# Patient Record
Sex: Male | Born: 1960
Health system: Southern US, Community
[De-identification: ages and names within clinical notes are randomized; demographics above are authoritative.]

## PROBLEM LIST (undated history)

## (undated) DIAGNOSIS — F1010 Alcohol abuse, uncomplicated: Secondary | ICD-10-CM

## (undated) DIAGNOSIS — G4730 Sleep apnea, unspecified: Secondary | ICD-10-CM

## (undated) DIAGNOSIS — I10 Essential (primary) hypertension: Secondary | ICD-10-CM

## (undated) DIAGNOSIS — I2510 Atherosclerotic heart disease of native coronary artery without angina pectoris: Secondary | ICD-10-CM

## (undated) DIAGNOSIS — Z8701 Personal history of pneumonia (recurrent): Secondary | ICD-10-CM

## (undated) DIAGNOSIS — I509 Heart failure, unspecified: Secondary | ICD-10-CM

## (undated) DIAGNOSIS — M1990 Unspecified osteoarthritis, unspecified site: Secondary | ICD-10-CM

## (undated) DIAGNOSIS — N186 End stage renal disease: Secondary | ICD-10-CM

## (undated) DIAGNOSIS — M069 Rheumatoid arthritis, unspecified: Secondary | ICD-10-CM

## (undated) DIAGNOSIS — Z992 Dependence on renal dialysis: Secondary | ICD-10-CM

## (undated) DIAGNOSIS — Z720 Tobacco use: Secondary | ICD-10-CM

## (undated) DIAGNOSIS — F419 Anxiety disorder, unspecified: Secondary | ICD-10-CM

## (undated) DIAGNOSIS — R0602 Shortness of breath: Secondary | ICD-10-CM

## (undated) DIAGNOSIS — K635 Polyp of colon: Secondary | ICD-10-CM

## (undated) DIAGNOSIS — K922 Gastrointestinal hemorrhage, unspecified: Secondary | ICD-10-CM

## (undated) DIAGNOSIS — I5030 Unspecified diastolic (congestive) heart failure: Secondary | ICD-10-CM

## (undated) DIAGNOSIS — N02B9 Other recurrent and persistent immunoglobulin A nephropathy: Secondary | ICD-10-CM

## (undated) DIAGNOSIS — D649 Anemia, unspecified: Secondary | ICD-10-CM

## (undated) DIAGNOSIS — J449 Chronic obstructive pulmonary disease, unspecified: Secondary | ICD-10-CM

## (undated) DIAGNOSIS — I214 Non-ST elevation (NSTEMI) myocardial infarction: Secondary | ICD-10-CM

## (undated) DIAGNOSIS — I480 Paroxysmal atrial fibrillation: Secondary | ICD-10-CM

## (undated) DIAGNOSIS — N028 Recurrent and persistent hematuria with other morphologic changes: Secondary | ICD-10-CM

## (undated) DIAGNOSIS — E785 Hyperlipidemia, unspecified: Secondary | ICD-10-CM

## (undated) DIAGNOSIS — J189 Pneumonia, unspecified organism: Secondary | ICD-10-CM

## (undated) DIAGNOSIS — K219 Gastro-esophageal reflux disease without esophagitis: Secondary | ICD-10-CM

## (undated) HISTORY — DX: Rheumatoid arthritis, unspecified: M06.9

## (undated) HISTORY — PX: CARDIAC CATHETERIZATION: SHX172

## (undated) HISTORY — DX: Recurrent and persistent hematuria with other morphologic changes: N02.8

## (undated) HISTORY — DX: Essential (primary) hypertension: I10

## (undated) HISTORY — DX: Polyp of colon: K63.5

## (undated) HISTORY — DX: Alcohol abuse, uncomplicated: F10.10

## (undated) HISTORY — DX: Other recurrent and persistent immunoglobulin A nephropathy: N02.B9

## (undated) HISTORY — DX: Atherosclerotic heart disease of native coronary artery without angina pectoris: I25.10

## (undated) HISTORY — DX: Paroxysmal atrial fibrillation: I48.0

## (undated) HISTORY — DX: Sleep apnea, unspecified: G47.30

## (undated) HISTORY — DX: Anxiety disorder, unspecified: F41.9

## (undated) HISTORY — DX: Gastrointestinal hemorrhage, unspecified: K92.2

## (undated) HISTORY — DX: Heart failure, unspecified: I50.9

## (undated) HISTORY — DX: Chronic obstructive pulmonary disease, unspecified: J44.9

## (undated) HISTORY — DX: Anemia, unspecified: D64.9

## (undated) HISTORY — DX: Tobacco use: Z72.0

## (undated) HISTORY — DX: Hyperlipidemia, unspecified: E78.5

## (undated) HISTORY — DX: Non-ST elevation (NSTEMI) myocardial infarction: I21.4

## (undated) HISTORY — DX: End stage renal disease: N18.6

---

## 2010-07-22 ENCOUNTER — Emergency Department: Payer: Self-pay | Admitting: Emergency Medicine

## 2012-09-13 HISTORY — PX: RENAL BIOPSY: SHX156

## 2012-11-02 ENCOUNTER — Emergency Department: Payer: Self-pay | Admitting: Emergency Medicine

## 2012-11-02 LAB — CBC
HCT: 36.4 % — ABNORMAL LOW (ref 40.0–52.0)
HGB: 12.5 g/dL — ABNORMAL LOW (ref 13.0–18.0)
MCH: 34.4 pg — ABNORMAL HIGH (ref 26.0–34.0)
MCHC: 34.3 g/dL (ref 32.0–36.0)
MCV: 100 fL (ref 80–100)
Platelet: 206 10*3/uL (ref 150–440)
RDW: 12.3 % (ref 11.5–14.5)
WBC: 7.1 10*3/uL (ref 3.8–10.6)

## 2012-11-02 LAB — COMPREHENSIVE METABOLIC PANEL
Albumin: 1.5 g/dL — ABNORMAL LOW (ref 3.4–5.0)
Alkaline Phosphatase: 89 U/L (ref 50–136)
Bilirubin,Total: 0.3 mg/dL (ref 0.2–1.0)
Calcium, Total: 8 mg/dL — ABNORMAL LOW (ref 8.5–10.1)
Chloride: 107 mmol/L (ref 98–107)
Co2: 26 mmol/L (ref 21–32)
Glucose: 96 mg/dL (ref 65–99)
Osmolality: 280 (ref 275–301)
Potassium: 4.9 mmol/L (ref 3.5–5.1)
Sodium: 139 mmol/L (ref 136–145)

## 2012-11-02 LAB — PRO B NATRIURETIC PEPTIDE: B-Type Natriuretic Peptide: 1991 pg/mL — ABNORMAL HIGH (ref 0–125)

## 2012-11-13 ENCOUNTER — Encounter: Payer: Self-pay | Admitting: *Deleted

## 2012-11-20 ENCOUNTER — Ambulatory Visit (INDEPENDENT_AMBULATORY_CARE_PROVIDER_SITE_OTHER): Payer: PRIVATE HEALTH INSURANCE | Admitting: Cardiovascular Disease

## 2012-11-20 ENCOUNTER — Encounter: Payer: Self-pay | Admitting: Cardiovascular Disease

## 2012-11-20 VITALS — BP 161/102 | HR 109 | Ht 67.0 in | Wt 173.2 lb

## 2012-11-20 DIAGNOSIS — R0789 Other chest pain: Secondary | ICD-10-CM

## 2012-11-20 DIAGNOSIS — R0609 Other forms of dyspnea: Secondary | ICD-10-CM

## 2012-11-20 DIAGNOSIS — F17200 Nicotine dependence, unspecified, uncomplicated: Secondary | ICD-10-CM

## 2012-11-20 DIAGNOSIS — I509 Heart failure, unspecified: Secondary | ICD-10-CM | POA: Insufficient documentation

## 2012-11-20 DIAGNOSIS — R0600 Dyspnea, unspecified: Secondary | ICD-10-CM

## 2012-11-20 DIAGNOSIS — R350 Frequency of micturition: Secondary | ICD-10-CM

## 2012-11-20 DIAGNOSIS — Z720 Tobacco use: Secondary | ICD-10-CM

## 2012-11-20 MED ORDER — METOPROLOL TARTRATE 25 MG PO TABS: 25.0000 mg | ORAL_TABLET | Freq: Two times a day (BID) | ORAL | Status: DC

## 2012-11-20 MED ORDER — SPIRONOLACTONE 25 MG PO TABS: 25.0000 mg | ORAL_TABLET | Freq: Every day | ORAL | Status: DC

## 2012-11-20 MED ORDER — FUROSEMIDE 20 MG PO TABS: 20.0000 mg | ORAL_TABLET | Freq: Two times a day (BID) | ORAL | Status: DC

## 2012-11-20 NOTE — Progress Notes (Signed)
HPI  This is a 52 year old Caucasian male who was referred by Dr. Mariea Clonts from [facility] ER for evaluation of newly diagnosed congestive heart failure. He has prolonged history of tobacco use as well as excessive alcohol use. He drinks at least 2-3 beers a day but many days he drinks 6-12 pack. He has been drinking for more than 30 years. He has not seen a physician in many years. He is not aware of any cardiac problems or liver problems. He presented recently to the emergency room with progressive lower extremity edema and weight gain. He reports a weight gain of 30 pounds over the last few months associated with severe lower extremity edema bilaterally with significant discomfort. He also has dyspnea but no chest discomfort. His labs in the emergency room showed a creatinine of 1.13 with a BUN of 20. AST was slightly elevated at 45 with severely reduced albumin level at 1.5 and a total protein of 5.4. He was mildly anemic at 12.5 with an MCV of 100. Platelet count was normal. BNP was close to 2000. Chest x-ray showed evidence of COPD. Bilateral lower extremity venous duplex showed no evidence of DVT there was mildly enlarged inguinal nodes bilaterally. He was discharged home on Lasix 20 mg once daily which he later switched to twice daily but he reports no significant improvement.  No Known Allergies   No current outpatient prescriptions on file prior to visit.   No current facility-administered medications on file prior to visit.     Past Medical History  Diagnosis Date  . CHF (congestive heart failure)   . COPD (chronic obstructive pulmonary disease)   . ETOH abuse   . Tobacco abuse      History reviewed. No pertinent past surgical history.   Family History  Problem Relation Age of Onset  . Heart disease Father   . Heart disease Brother      History   Social History  . Marital Status: Married    Spouse Name: N/A    Number of Children: N/A  . Years of Education: N/A    Occupational History  . Not on file.   Social History Main Topics  . Smoking status: Current Every Day Smoker -- 0.50 packs/day for 30 years    Types: Cigarettes  . Smokeless tobacco: Not on file  . Alcohol Use: 3.6 oz/week    6 Cans of beer per week     Comment: daily  . Drug Use: No  . Sexually Active: Not on file   Other Topics Concern  . Not on file   Social History Narrative  . No narrative on file     ROS Constitutional: Negative for fever, chills, diaphoresis, activity change, appetite change and fatigue.  HENT: Negative for hearing loss, nosebleeds, congestion, sore throat, facial swelling, drooling, trouble swallowing, neck pain, voice change, sinus pressure and tinnitus.  Eyes: Negative for photophobia, pain, discharge and visual disturbance.  Respiratory: Negative for apnea, cough, chest tightness and wheezing.  Cardiovascular: Negative for chest pain, palpitations . Gastrointestinal: Negative for nausea, vomiting, abdominal pain, diarrhea, constipation, blood in stool and abdominal distention.  Genitourinary: Negative for dysuria, hematuria and decreased urine volume.  Musculoskeletal: Negative for myalgias, back pain, joint swelling, arthralgias and gait problem.  Skin: Negative for color change, pallor, rash and wound.  Neurological: Negative for dizziness, tremors, seizures, syncope, speech difficulty, weakness, light-headedness, numbness and headaches.  Psychiatric/Behavioral: Negative for suicidal ideas, hallucinations, behavioral problems and agitation. The patient is not  nervous/anxious.     PHYSICAL EXAM   BP 161/102  Pulse 109  Ht 5\' 7"  (1.702 m)  Wt 173 lb 4 oz (78.586 kg)  BMI 27.13 kg/m2 Constitutional: He is oriented to person, place, and time. He appears well-developed and well-nourished. No distress.  HENT: No nasal discharge.  Head: Normocephalic and atraumatic.  Eyes: Pupils are equal and round. Right eye exhibits no discharge. Left eye  exhibits no discharge.  Neck: Normal range of motion. Neck supple. No JVD present. No thyromegaly present.  Cardiovascular: Normal rate, regular rhythm, normal heart sounds and. Exam reveals no gallop and no friction rub. No murmur heard.  Pulmonary/Chest: Effort normal and breath sounds normal. No stridor. No respiratory distress. He has no wheezes. He has no rales. He exhibits no tenderness.  Abdominal: Soft. Bowel sounds are normal. He exhibits no distension. There is no tenderness. There is no rebound and no guarding.  Musculoskeletal: Normal range of motion. He exhibits severe +3 edema going all the way up to his thighs and mild tenderness.  Neurological: He is alert and oriented to person, place, and time. Coordination normal.  Skin: Skin is warm and dry. Possible spider angiomas noted on the chest. He is not diaphoretic. No erythema. No pallor.  Psychiatric: He has a normal mood and affect. His behavior is normal. Judgment and thought content normal.       EKG: Sinus  Tachycardia  WITHIN NORMAL LIMITS   ASSESSMENT AND PLAN

## 2012-11-20 NOTE — Assessment & Plan Note (Signed)
The patient's manifestation include severe lower extremity edema and mostly right-sided heart failure. JVP is not significantly elevated. However, his BNP was elevated. It is possible that he might have been diagnosed cardiomyopathy related to excessive alcohol use. The other concerning issue is severely decreased albumin at 1.5 with abnormal liver enzymes. It's highly likely that he has underlying liver cirrhosis. I will check urinalysis to make sure he has no significant proteinuria. I will also request an echocardiogram to evaluate LV systolic function. He is still significantly fluid overloaded and seems to be 30 pounds above his normal weight. I will start him on spironolactone 25 mg once daily he take in addition to furosemide. Check basic metabolic profile in one week. Followup with me in 2 weeks. If his fluid overload does not improve, he will need to be admitted to the hospital for IV diuresis. I asked him to establish with a primary care physician as well.

## 2012-11-20 NOTE — Assessment & Plan Note (Signed)
I discussed with him the importance of quitting tobacco and alcohol use.

## 2012-11-20 NOTE — Patient Instructions (Addendum)
Continue Lasix 20 mg twice daily.  Start Spironolactone 25 mg once daily.  Start Metoprolol 25 mg twice daily.   Check Urinalysis today.   Your physician has requested that you have an echocardiogram. Echocardiography is a painless test that uses sound waves to create images of your heart. It provides your doctor with information about the size and shape of your heart and how well your heart's chambers and valves are working. This procedure takes approximately one hour. There are no restrictions for this procedure.  Labs in 1 week.  Follow up in 2 weeks.

## 2012-11-21 ENCOUNTER — Other Ambulatory Visit: Payer: PRIVATE HEALTH INSURANCE

## 2012-11-21 ENCOUNTER — Encounter: Payer: Self-pay | Admitting: Cardiovascular Disease

## 2012-11-28 ENCOUNTER — Other Ambulatory Visit (INDEPENDENT_AMBULATORY_CARE_PROVIDER_SITE_OTHER): Payer: PRIVATE HEALTH INSURANCE

## 2012-11-28 ENCOUNTER — Other Ambulatory Visit: Payer: Self-pay

## 2012-11-28 DIAGNOSIS — R0789 Other chest pain: Secondary | ICD-10-CM

## 2012-11-28 DIAGNOSIS — R0989 Other specified symptoms and signs involving the circulatory and respiratory systems: Secondary | ICD-10-CM

## 2012-11-28 DIAGNOSIS — I509 Heart failure, unspecified: Secondary | ICD-10-CM

## 2012-11-28 DIAGNOSIS — R0600 Dyspnea, unspecified: Secondary | ICD-10-CM

## 2012-12-08 ENCOUNTER — Ambulatory Visit (INDEPENDENT_AMBULATORY_CARE_PROVIDER_SITE_OTHER): Payer: PRIVATE HEALTH INSURANCE | Admitting: Cardiovascular Disease

## 2012-12-08 ENCOUNTER — Encounter: Payer: Self-pay | Admitting: Cardiovascular Disease

## 2012-12-08 VITALS — BP 148/94 | HR 81 | Ht 67.0 in | Wt 164.5 lb

## 2012-12-08 DIAGNOSIS — I509 Heart failure, unspecified: Secondary | ICD-10-CM

## 2012-12-08 NOTE — Progress Notes (Signed)
HPI  This is a 52 year old Caucasian male who is here today for followup visit regarding  lower extremity edema. He has prolonged history of tobacco use as well as excessive alcohol use. He drinks at least 2-3 beers a day but many days he drinks 6-12 pack. He has been drinking for more than 30 years. He presented recently to the emergency room with progressive lower extremity edema and 30 pound weight gain. He was noted to have severe lower extremity edema bilaterally with significant discomfort. His labs in the emergency room showed a creatinine of 1.13 with a BUN of 20. AST was slightly elevated at 45 with severely reduced albumin level at 1.5 and a total protein of 5.4. He was mildly anemic at 12.5 with an MCV of 100. Platelet count was normal. BNP was close to 2000. Chest x-ray showed evidence of COPD. Bilateral lower extremity venous duplex showed no evidence of DVT there was mildly enlarged inguinal nodes bilaterally. He was discharged home on Lasix 20 mg once daily which he later switched to twice daily but he reports no significant improvement. During my initial evaluation, he was noted to be tachycardic and hypertensive. I started him on metoprolol 25 mg twice daily and added spironolactone 25 mg once daily. Due to his significant low albumin level. I performed urinalysis which showed severe proteinuria. I thus referred him to nephrology and he is currently getting workup for this. The dose of Lasix was increased to 40 mg twice daily by nephrology and since then the patient had significant improvement in his edema. He lost 10 pounds. He was also found to have hematuria and will be seeing urology. He had an echocardiogram done which showed normal LV systolic function with no evidence of pulmonary hypertension.  No Known Allergies   Current Outpatient Prescriptions on File Prior to Visit  Medication Sig Dispense Refill  . IBUPROFEN PO Take by mouth. Takes 2 -3 tablet as needed.      .  metoprolol tartrate (LOPRESSOR) 25 MG tablet Take 1 tablet (25 mg total) by mouth 2 (two) times daily.  60 tablet  6  . spironolactone (ALDACTONE) 25 MG tablet Take 1 tablet (25 mg total) by mouth daily.  30 tablet  6   No current facility-administered medications on file prior to visit.     Past Medical History  Diagnosis Date  . CHF (congestive heart failure)   . COPD (chronic obstructive pulmonary disease)   . ETOH abuse   . Tobacco abuse      History reviewed. No pertinent past surgical history.   Family History  Problem Relation Age of Onset  . Heart disease Father   . Heart disease Brother      History   Social History  . Marital Status: Married    Spouse Name: N/A    Number of Children: N/A  . Years of Education: N/A   Occupational History  . Not on file.   Social History Main Topics  . Smoking status: Current Every Day Smoker -- 0.50 packs/day for 30 years    Types: Cigarettes  . Smokeless tobacco: Not on file  . Alcohol Use: 3.6 oz/week    6 Cans of beer per week     Comment: daily  . Drug Use: No  . Sexually Active: Not on file   Other Topics Concern  . Not on file   Social History Narrative  . No narrative on file    PHYSICAL EXAM   BP  148/94  Pulse 81  Ht 5\' 7"  (1.702 m)  Wt 164 lb 8 oz (74.617 kg)  BMI 25.76 kg/m2 Constitutional: He is oriented to person, place, and time. He appears well-developed and well-nourished. No distress.  HENT: No nasal discharge.  Head: Normocephalic and atraumatic.  Eyes: Pupils are equal and round. Right eye exhibits no discharge. Left eye exhibits no discharge.  Neck: Normal range of motion. Neck supple. No JVD present. No thyromegaly present.  Cardiovascular: Normal rate, regular rhythm, normal heart sounds and. Exam reveals no gallop and no friction rub. No murmur heard.  Pulmonary/Chest: Effort normal and breath sounds normal. No stridor. No respiratory distress. He has no wheezes. He has no rales. He  exhibits no tenderness.  Abdominal: Soft. Bowel sounds are normal. He exhibits no distension. There is no tenderness. There is no rebound and no guarding.  Musculoskeletal: Normal range of motion. He exhibits severe +1 edema which is significantly better from before.  Neurological: He is alert and oriented to person, place, and time. Coordination normal.  Skin: Skin is warm and dry. Possible spider angiomas noted on the chest. He is not diaphoretic. No erythema. No pallor.  Psychiatric: He has a normal mood and affect. His behavior is normal. Judgment and thought content normal.       ASSESSMENT AND PLAN

## 2012-12-08 NOTE — Patient Instructions (Addendum)
Continue same medications.  Follow up in 4 months.

## 2012-12-08 NOTE — Assessment & Plan Note (Addendum)
He did have recently elevated BNP with significant fluid overload. However, his manifestation seems to be mostly related to hypoalbuminemia and significant proteinuria. Echocardiogram overall was unremarkable.  He has responded very well to current dose of diuretics which will be continued. He has a close followup appointment with nephrology next week to continue evaluation for proteinuria. His blood pressure and tachycardia improved significantly after the recent addition of metoprolol.

## 2013-02-14 ENCOUNTER — Other Ambulatory Visit: Payer: Self-pay | Admitting: Nephrology

## 2013-02-14 LAB — CBC WITH DIFFERENTIAL/PLATELET
Basophil #: 0.1 10*3/uL (ref 0.0–0.1)
Basophil %: 1.1 %
Eosinophil #: 0.2 10*3/uL (ref 0.0–0.7)
Eosinophil %: 2.4 %
HGB: 9.9 g/dL — ABNORMAL LOW (ref 13.0–18.0)
Lymphocyte #: 1.7 10*3/uL (ref 1.0–3.6)
MCH: 34 pg (ref 26.0–34.0)
MCHC: 34.4 g/dL (ref 32.0–36.0)
MCV: 99 fL (ref 80–100)
Neutrophil #: 3.5 10*3/uL (ref 1.4–6.5)
Neutrophil %: 54.2 %
Platelet: 243 10*3/uL (ref 150–440)
RBC: 2.91 10*6/uL — ABNORMAL LOW (ref 4.40–5.90)
RDW: 11.8 % (ref 11.5–14.5)

## 2013-02-14 LAB — URINALYSIS, COMPLETE
Hyaline Cast: 9
Ketone: NEGATIVE
Leukocyte Esterase: NEGATIVE
Ph: 5 (ref 4.5–8.0)
Protein: >=500
RBC,UR: 153 /HPF (ref 0–5)
Specific Gravity: 1.013 (ref 1.003–1.030)
Squamous Epithelial: 1
WBC UR: 7 /HPF (ref 0–5)

## 2013-02-14 LAB — APTT: Activated PTT: 30 secs (ref 23.6–35.9)

## 2013-02-14 LAB — COMPREHENSIVE METABOLIC PANEL
Albumin: 1.5 g/dL — ABNORMAL LOW (ref 3.4–5.0)
Alkaline Phosphatase: 79 U/L (ref 50–136)
Anion Gap: 8 (ref 7–16)
BUN: 33 mg/dL — ABNORMAL HIGH (ref 7–18)
Bilirubin,Total: 0.1 mg/dL — ABNORMAL LOW (ref 0.2–1.0)
Calcium, Total: 8.1 mg/dL — ABNORMAL LOW (ref 8.5–10.1)
Chloride: 103 mmol/L (ref 98–107)
Creatinine: 2.11 mg/dL — ABNORMAL HIGH (ref 0.60–1.30)
EGFR (African American): 41 — ABNORMAL LOW
EGFR (Non-African Amer.): 36 — ABNORMAL LOW
Potassium: 5 mmol/L (ref 3.5–5.1)
SGOT(AST): 29 U/L (ref 15–37)
SGPT (ALT): 19 U/L (ref 12–78)
Sodium: 133 mmol/L — ABNORMAL LOW (ref 136–145)

## 2013-02-14 LAB — PROTEIN / CREATININE RATIO, URINE
Creatinine, Urine: 121.4 mg/dL (ref 30.0–125.0)
Protein/Creat. Ratio: 4300 mg/gCREAT — ABNORMAL HIGH (ref 0–200)

## 2013-02-14 LAB — PROTIME-INR
INR: 0.9
Prothrombin Time: 12.7 secs (ref 11.5–14.7)

## 2013-02-16 ENCOUNTER — Observation Stay: Payer: Self-pay | Admitting: Nephrology

## 2013-02-16 LAB — COMPREHENSIVE METABOLIC PANEL
BUN: 30 mg/dL — ABNORMAL HIGH (ref 7–18)
Bilirubin,Total: 0.2 mg/dL (ref 0.2–1.0)
Calcium, Total: 8.1 mg/dL — ABNORMAL LOW (ref 8.5–10.1)
Co2: 23 mmol/L (ref 21–32)
EGFR (African American): >60
EGFR (Non-African Amer.): 53 — ABNORMAL LOW
Glucose: 79 mg/dL (ref 65–99)
Osmolality: 277 (ref 275–301)
Potassium: 4.9 mmol/L (ref 3.5–5.1)
SGOT(AST): 29 U/L (ref 15–37)
SGPT (ALT): 16 U/L (ref 12–78)
Total Protein: 5.3 g/dL — ABNORMAL LOW (ref 6.4–8.2)

## 2013-02-16 LAB — CBC WITH DIFFERENTIAL/PLATELET
Basophil #: 0.1 10*3/uL (ref 0.0–0.1)
Basophil %: 1.1 %
Eosinophil %: 1.5 %
HCT: 31.3 % — ABNORMAL LOW (ref 40.0–52.0)
Lymphocyte %: 13.7 %
MCH: 34.1 pg — ABNORMAL HIGH (ref 26.0–34.0)
MCHC: 34.8 g/dL (ref 32.0–36.0)
MCV: 98 fL (ref 80–100)
Monocyte #: 1.6 x10 3/mm — ABNORMAL HIGH (ref 0.2–1.0)
Neutrophil #: 6.9 10*3/uL — ABNORMAL HIGH (ref 1.4–6.5)
Neutrophil %: 68.3 %
Platelet: 295 10*3/uL (ref 150–440)

## 2013-02-16 LAB — PROTEIN / CREATININE RATIO, URINE: Creatinine, Urine: 109.5 mg/dL (ref 30.0–125.0)

## 2013-02-16 LAB — URINALYSIS, COMPLETE
Ketone: NEGATIVE
Leukocyte Esterase: NEGATIVE
Ph: 6 (ref 4.5–8.0)
Protein: >=500
RBC,UR: 122 /HPF (ref 0–5)
Specific Gravity: 1.015 (ref 1.003–1.030)
Squamous Epithelial: <1

## 2013-02-16 LAB — PROTIME-INR
INR: 0.9
Prothrombin Time: 12.3 secs (ref 11.5–14.7)

## 2013-02-16 LAB — HEMOGLOBIN: HGB: 10.5 g/dL — ABNORMAL LOW (ref 13.0–18.0)

## 2013-07-09 ENCOUNTER — Other Ambulatory Visit: Payer: Self-pay | Admitting: *Deleted

## 2013-07-09 MED ORDER — SPIRONOLACTONE 25 MG PO TABS: 25.0000 mg | ORAL_TABLET | Freq: Every day | ORAL | Status: DC

## 2013-07-09 NOTE — Telephone Encounter (Signed)
Requested Prescriptions   Signed Prescriptions Disp Refills  . spironolactone (ALDACTONE) 25 MG tablet 30 tablet 6    Sig: Take 1 tablet (25 mg total) by mouth daily.    Authorizing Provider: Kathlyn Sacramento A    Ordering User: Britt Bottom

## 2013-08-07 ENCOUNTER — Other Ambulatory Visit: Payer: Self-pay | Admitting: *Deleted

## 2013-08-07 ENCOUNTER — Other Ambulatory Visit: Payer: Self-pay | Admitting: Cardiovascular Disease

## 2013-08-07 MED ORDER — METOPROLOL TARTRATE 25 MG PO TABS: 25.0000 mg | ORAL_TABLET | Freq: Two times a day (BID) | ORAL | Status: DC

## 2013-08-07 NOTE — Telephone Encounter (Signed)
Requested Prescriptions   Signed Prescriptions Disp Refills  . metoprolol tartrate (LOPRESSOR) 25 MG tablet 60 tablet 1    Sig: Take 1 tablet (25 mg total) by mouth 2 (two) times daily.    Authorizing Provider: Kathlyn Sacramento A    Ordering User: Britt Bottom

## 2013-10-14 HISTORY — PX: OTHER SURGICAL HISTORY: SHX169

## 2013-10-22 ENCOUNTER — Ambulatory Visit: Payer: Self-pay | Admitting: Vascular Surgery

## 2013-10-29 ENCOUNTER — Inpatient Hospital Stay: Payer: Self-pay | Admitting: Internal Medicine

## 2013-10-29 DIAGNOSIS — I5033 Acute on chronic diastolic (congestive) heart failure: Secondary | ICD-10-CM

## 2013-10-29 DIAGNOSIS — I10 Essential (primary) hypertension: Secondary | ICD-10-CM

## 2013-10-29 DIAGNOSIS — I214 Non-ST elevation (NSTEMI) myocardial infarction: Secondary | ICD-10-CM

## 2013-10-29 LAB — URINALYSIS, COMPLETE
BILIRUBIN, UR: NEGATIVE
Bacteria: NONE SEEN
Glucose,UR: 150 mg/dL (ref 0–75)
Hyaline Cast: 1
Ketone: NEGATIVE
Leukocyte Esterase: NEGATIVE
NITRITE: NEGATIVE
PH: 6 (ref 4.5–8.0)
RBC,UR: 14 /HPF (ref 0–5)
Specific Gravity: 1.01 (ref 1.003–1.030)
Squamous Epithelial: <1

## 2013-10-29 LAB — CBC WITH DIFFERENTIAL/PLATELET
BASOS ABS: 0.1 10*3/uL (ref 0.0–0.1)
BASOS PCT: 1 %
BASOS PCT: 1.1 %
Basophil #: 0.1 10*3/uL (ref 0.0–0.1)
EOS PCT: 1.6 %
Eosinophil #: 0.2 10*3/uL (ref 0.0–0.7)
Eosinophil #: 0 10*3/uL (ref 0.0–0.7)
Eosinophil %: 0.4 %
HCT: 26.7 % — AB (ref 40.0–52.0)
HCT: 22 % — ABNORMAL LOW (ref 40.0–52.0)
HGB: 8.6 g/dL — AB (ref 13.0–18.0)
HGB: 7.6 g/dL — AB (ref 13.0–18.0)
LYMPHS ABS: 1.2 10*3/uL (ref 1.0–3.6)
LYMPHS PCT: 10.2 %
LYMPHS PCT: 11.2 %
Lymphocyte #: 1.4 10*3/uL (ref 1.0–3.6)
MCH: 32.6 pg (ref 26.0–34.0)
MCH: 34.2 pg — AB (ref 26.0–34.0)
MCHC: 32.1 g/dL (ref 32.0–36.0)
MCHC: 34.6 g/dL (ref 32.0–36.0)
MCV: 102 fL — ABNORMAL HIGH (ref 80–100)
MCV: 99 fL (ref 80–100)
Monocyte #: 1.1 x10 3/mm — ABNORMAL HIGH (ref 0.2–1.0)
Monocyte #: 1 x10 3/mm (ref 0.2–1.0)
Monocyte %: 7.9 %
Monocyte %: 8.8 %
NEUTROS PCT: 79.3 %
Neutrophil #: 10.9 10*3/uL — ABNORMAL HIGH (ref 1.4–6.5)
Neutrophil #: 8.6 10*3/uL — ABNORMAL HIGH (ref 1.4–6.5)
Neutrophil %: 78.5 %
Platelet: 226 10*3/uL (ref 150–440)
RBC: 2.63 10*6/uL — AB (ref 4.40–5.90)
RBC: 2.23 10*6/uL — AB (ref 4.40–5.90)
RDW: 13.2 % (ref 11.5–14.5)
RDW: 12.4 % (ref 11.5–14.5)
WBC: 13.6 10*3/uL — AB (ref 3.8–10.6)
WBC: 10.9 10*3/uL — ABNORMAL HIGH (ref 3.8–10.6)

## 2013-10-29 LAB — CK TOTAL AND CKMB (NOT AT ARMC)
CK, TOTAL: 163 U/L
CK, Total: 161 U/L
CK-MB: 3.2 ng/mL (ref 0.5–3.6)
CK-MB: 5.7 ng/mL — ABNORMAL HIGH (ref 0.5–3.6)

## 2013-10-29 LAB — PLATELET COUNT: Platelet: 282 10*3/uL (ref 150–440)

## 2013-10-29 LAB — COMPREHENSIVE METABOLIC PANEL
ALK PHOS: 96 U/L
ANION GAP: 10 (ref 7–16)
AST: 27 U/L (ref 15–37)
Albumin: 2.1 g/dL — ABNORMAL LOW (ref 3.4–5.0)
BUN: 36 mg/dL — AB (ref 7–18)
Bilirubin,Total: 0.2 mg/dL (ref 0.2–1.0)
CHLORIDE: 98 mmol/L (ref 98–107)
CO2: 23 mmol/L (ref 21–32)
CREATININE: 5.16 mg/dL — AB (ref 0.60–1.30)
Calcium, Total: 8.1 mg/dL — ABNORMAL LOW (ref 8.5–10.1)
EGFR (African American): 14 — ABNORMAL LOW
EGFR (Non-African Amer.): 12 — ABNORMAL LOW
GLUCOSE: 107 mg/dL — AB (ref 65–99)
Osmolality: 271 (ref 275–301)
Potassium: 4.3 mmol/L (ref 3.5–5.1)
SGPT (ALT): 14 U/L (ref 12–78)
Sodium: 131 mmol/L — ABNORMAL LOW (ref 136–145)
TOTAL PROTEIN: 6.5 g/dL (ref 6.4–8.2)

## 2013-10-29 LAB — PHOSPHORUS: PHOSPHORUS: 5.9 mg/dL — AB (ref 2.5–4.9)

## 2013-10-29 LAB — APTT
ACTIVATED PTT: 66 s — AB (ref 23.6–35.9)
Activated PTT: 28.4 secs (ref 23.6–35.9)

## 2013-10-29 LAB — PRO B NATRIURETIC PEPTIDE: B-Type Natriuretic Peptide: 114079 pg/mL — ABNORMAL HIGH (ref 0–125)

## 2013-10-29 LAB — TROPONIN I
TROPONIN-I: 0.63 ng/mL — AB
TROPONIN-I: 1.3 ng/mL — AB
Troponin-I: 0.07 ng/mL — ABNORMAL HIGH

## 2013-10-29 LAB — PROTIME-INR
INR: 1
Prothrombin Time: 12.7 secs (ref 11.5–14.7)

## 2013-10-30 DIAGNOSIS — I2510 Atherosclerotic heart disease of native coronary artery without angina pectoris: Secondary | ICD-10-CM

## 2013-10-30 DIAGNOSIS — D649 Anemia, unspecified: Secondary | ICD-10-CM

## 2013-10-30 LAB — CBC WITH DIFFERENTIAL/PLATELET
BASOS ABS: 0.1 10*3/uL (ref 0.0–0.1)
Basophil %: 0.9 %
Eosinophil #: 0.1 10*3/uL (ref 0.0–0.7)
Eosinophil %: 1.8 %
HCT: 22.4 % — AB (ref 40.0–52.0)
HGB: 7.8 g/dL — AB (ref 13.0–18.0)
Lymphocyte #: 1.5 10*3/uL (ref 1.0–3.6)
Lymphocyte %: 20.2 %
MCH: 33.4 pg (ref 26.0–34.0)
MCHC: 34.6 g/dL (ref 32.0–36.0)
MCV: 97 fL (ref 80–100)
MONO ABS: 1.2 x10 3/mm — AB (ref 0.2–1.0)
MONOS PCT: 16.5 %
NEUTROS ABS: 4.6 10*3/uL (ref 1.4–6.5)
NEUTROS PCT: 60.6 %
Platelet: 196 10*3/uL (ref 150–440)
RBC: 2.32 10*6/uL — ABNORMAL LOW (ref 4.40–5.90)
RDW: 14.7 % — ABNORMAL HIGH (ref 11.5–14.5)
WBC: 7.6 10*3/uL (ref 3.8–10.6)

## 2013-10-30 LAB — BASIC METABOLIC PANEL
Anion Gap: 5 — ABNORMAL LOW (ref 7–16)
BUN: 19 mg/dL — ABNORMAL HIGH (ref 7–18)
CALCIUM: 7.4 mg/dL — AB (ref 8.5–10.1)
CO2: 30 mmol/L (ref 21–32)
Chloride: 99 mmol/L (ref 98–107)
Creatinine: 3.44 mg/dL — ABNORMAL HIGH (ref 0.60–1.30)
GFR CALC AF AMER: 23 — AB
GFR CALC NON AF AMER: 20 — AB
GLUCOSE: 88 mg/dL (ref 65–99)
OSMOLALITY: 270 (ref 275–301)
Potassium: 3.5 mmol/L (ref 3.5–5.1)
Sodium: 134 mmol/L — ABNORMAL LOW (ref 136–145)

## 2013-10-30 LAB — LIPID PANEL
Cholesterol: 143 mg/dL (ref 0–200)
HDL Cholesterol: 65 mg/dL — ABNORMAL HIGH (ref 40–60)
LDL CHOLESTEROL, CALC: 66 mg/dL (ref 0–100)
TRIGLYCERIDES: 62 mg/dL (ref 0–200)
VLDL Cholesterol, Calc: 12 mg/dL (ref 5–40)

## 2013-10-30 LAB — PROTIME-INR
INR: 1
PROTHROMBIN TIME: 13.3 s (ref 11.5–14.7)

## 2013-10-30 LAB — APTT
ACTIVATED PTT: 58.8 s — AB (ref 23.6–35.9)
ACTIVATED PTT: 29.7 s (ref 23.6–35.9)

## 2013-10-31 LAB — CBC WITH DIFFERENTIAL/PLATELET
BASOS ABS: 0.1 10*3/uL (ref 0.0–0.1)
BASOS PCT: 1 %
EOS PCT: 2.3 %
Eosinophil #: 0.2 10*3/uL (ref 0.0–0.7)
HCT: 28.3 % — AB (ref 40.0–52.0)
HGB: 9.9 g/dL — AB (ref 13.0–18.0)
Lymphocyte #: 1.1 10*3/uL (ref 1.0–3.6)
Lymphocyte %: 12.1 %
MCH: 33.9 pg (ref 26.0–34.0)
MCHC: 35 g/dL (ref 32.0–36.0)
MCV: 97 fL (ref 80–100)
Monocyte #: 1.4 x10 3/mm — ABNORMAL HIGH (ref 0.2–1.0)
Monocyte %: 15.1 %
NEUTROS ABS: 6.5 10*3/uL (ref 1.4–6.5)
Neutrophil %: 69.5 %
Platelet: 214 10*3/uL (ref 150–440)
RBC: 2.92 10*6/uL — ABNORMAL LOW (ref 4.40–5.90)
RDW: 14.9 % — ABNORMAL HIGH (ref 11.5–14.5)
WBC: 9.4 10*3/uL (ref 3.8–10.6)

## 2013-10-31 LAB — RENAL FUNCTION PANEL
ALBUMIN: 1.8 g/dL — AB (ref 3.4–5.0)
ANION GAP: 6 — AB (ref 7–16)
BUN: 31 mg/dL — ABNORMAL HIGH (ref 7–18)
CHLORIDE: 97 mmol/L — AB (ref 98–107)
CO2: 29 mmol/L (ref 21–32)
Calcium, Total: 8.5 mg/dL (ref 8.5–10.1)
Creatinine: 4.94 mg/dL — ABNORMAL HIGH (ref 0.60–1.30)
EGFR (African American): 15 — ABNORMAL LOW
GFR CALC NON AF AMER: 13 — AB
GLUCOSE: 103 mg/dL — AB (ref 65–99)
Osmolality: 271 (ref 275–301)
PHOSPHORUS: 4.3 mg/dL (ref 2.5–4.9)
POTASSIUM: 3.5 mmol/L (ref 3.5–5.1)
Sodium: 132 mmol/L — ABNORMAL LOW (ref 136–145)

## 2013-10-31 LAB — HEMOGLOBIN: HGB: 9.2 g/dL — ABNORMAL LOW (ref 13.0–18.0)

## 2013-11-01 DIAGNOSIS — I214 Non-ST elevation (NSTEMI) myocardial infarction: Secondary | ICD-10-CM

## 2013-11-01 DIAGNOSIS — I10 Essential (primary) hypertension: Secondary | ICD-10-CM

## 2013-11-01 LAB — HEMOGLOBIN: HGB: 9.9 g/dL — AB (ref 13.0–18.0)

## 2013-11-02 ENCOUNTER — Telehealth: Payer: Self-pay | Admitting: *Deleted

## 2013-11-02 DIAGNOSIS — I10 Essential (primary) hypertension: Secondary | ICD-10-CM

## 2013-11-02 DIAGNOSIS — I214 Non-ST elevation (NSTEMI) myocardial infarction: Secondary | ICD-10-CM

## 2013-11-02 LAB — PHOSPHORUS: PHOSPHORUS: 3.5 mg/dL (ref 2.5–4.9)

## 2013-11-02 NOTE — Telephone Encounter (Signed)
Patient contacted regarding discharge from [facility] on 11/02/13.  Patient understands to follow up with provider Arida on 11/08/13 at 3:30pm . Patient understands discharge instructions? yes Patient understands medications and regiment? yes Patient understands to bring all medications to this visit? yes

## 2013-11-05 LAB — PATHOLOGY REPORT

## 2013-11-08 ENCOUNTER — Encounter: Payer: PRIVATE HEALTH INSURANCE | Admitting: Cardiovascular Disease

## 2013-11-13 DIAGNOSIS — N186 End stage renal disease: Secondary | ICD-10-CM | POA: Diagnosis not present

## 2013-11-14 DIAGNOSIS — N186 End stage renal disease: Secondary | ICD-10-CM | POA: Diagnosis not present

## 2013-11-14 DIAGNOSIS — I10 Essential (primary) hypertension: Secondary | ICD-10-CM | POA: Diagnosis not present

## 2013-11-15 ENCOUNTER — Encounter: Payer: Self-pay | Admitting: *Deleted

## 2013-11-16 ENCOUNTER — Encounter: Payer: Self-pay | Admitting: Cardiovascular Disease

## 2013-11-16 ENCOUNTER — Ambulatory Visit (INDEPENDENT_AMBULATORY_CARE_PROVIDER_SITE_OTHER): Payer: PRIVATE HEALTH INSURANCE | Admitting: Cardiovascular Disease

## 2013-11-16 VITALS — BP 158/88 | HR 64 | Ht 69.0 in | Wt 163.5 lb

## 2013-11-16 DIAGNOSIS — I5032 Chronic diastolic (congestive) heart failure: Secondary | ICD-10-CM

## 2013-11-16 DIAGNOSIS — E785 Hyperlipidemia, unspecified: Secondary | ICD-10-CM

## 2013-11-16 DIAGNOSIS — R079 Chest pain, unspecified: Secondary | ICD-10-CM

## 2013-11-16 DIAGNOSIS — I10 Essential (primary) hypertension: Secondary | ICD-10-CM | POA: Insufficient documentation

## 2013-11-16 DIAGNOSIS — I25810 Atherosclerosis of coronary artery bypass graft(s) without angina pectoris: Secondary | ICD-10-CM

## 2013-11-16 DIAGNOSIS — I2510 Atherosclerotic heart disease of native coronary artery without angina pectoris: Secondary | ICD-10-CM

## 2013-11-16 NOTE — Assessment & Plan Note (Addendum)
The patient had a recent non-ST elevation myocardial infarction in the setting of hypertensive urgency and anemia. Cardiac catheterization showed significant three-vessel coronary artery disease. I reviewed the images again today with the patient and his son who is heavily involved in his father's decision making. In the mid LAD, there is an 80% heavily calcified stenosis. The RCA with 90% diffuse proximal to mid disease and left circumflex with 70% distal stenosis and 70% disease in the OM1 . The disease in the left circumflex was underappreciated in my cath report.  I discussed the options of multivessel PCI versus CABG. I favor the later due to calcified vessels, uncertainty about the ability to t tolerate dual antiplatelet therapy with a recent history of GI bleed as well as the fact that he is on dialysis which makes him at higher risk for restenosis. He has good targets in LAD, OM 1 and OM 3. The RPDA is relatively small but the distal RCA is likely graftable. The patient is waiting to have AV fistula but that should be postponed until after CABG.

## 2013-11-16 NOTE — Progress Notes (Signed)
HPI  This is a 53 year old Caucasian male who is here today for followup visit regarding coronary artery disease and recent hospitalization for non-ST elevation myocardial infarction.  He has prolonged history of tobacco use as well as excessive alcohol use. He cut down on alcohol use significantly. He has end-stage renal disease on hemodialysis on Monday Wednesday Friday due to IgA nephropathy with nephrotic syndrome. Other conditions include hypertension and hyperlipidemia. He was started on dialysis in February and presented a few days later with severe dyspnea at rest. He was noted to be significantly hypertensive with a blood pressure of 216/124 on presentation. He was hypoxic with evidence of pulmonary edema. He reported gradual decline in functional capacity. He also noticed dark stools. He was noted to be anemic with a hemoglobin of 8.6. Troponin was elevated at 1.5 with BNP greater than 100,000. Echocardiogram showed normal LV systolic function with mild mitral regurgitation and moderate tricuspid regurgitation. Improved significantly with blood pressure control and fluid removal during dialysis. He underwent blood transfusion. Cardiac catheterization showed significant three-vessel coronary artery disease. No PCI was performed due to GI bleed. He underwent EGD and colonoscopy by Dr.Wohl which showed multiple colon polyps which were resected. A total of 14 polyps were removed and thought to be benign. I don't have the official results of pathology. The patient had bleeding post colonoscopy that gradually improved. He reports no further bleeding at this point.  He feels better since hospital discharge although he continues to have significant exertional dyspnea.   No Known Allergies   Current Outpatient Prescriptions on File Prior to Visit  Medication Sig Dispense Refill  . amLODipine (NORVASC) 10 MG tablet Take 10 mg by mouth daily.      [name] aspirin 325 MG tablet Take 325 mg by mouth daily.       [name] atorvastatin (LIPITOR) 20 MG tablet Take 20 mg by mouth daily.      . furosemide (LASIX) 80 MG tablet Take 80 mg by mouth 2 (two) times daily.      . hydrALAZINE (APRESOLINE) 50 MG tablet Take 50 mg by mouth every 8 (eight) hours.      . IBUPROFEN PO Take by mouth. Takes 2 -3 tablet as needed.      [name] losartan (COZAAR) 100 MG tablet Take 100 mg by mouth daily.      . metoprolol (LOPRESSOR) 100 MG tablet Take 100 mg by mouth 2 (two) times daily.      . nitroGLYCERIN (NITROSTAT) 0.4 MG SL tablet Place 0.4 mg under the tongue every 5 (five) minutes as needed for chest pain.      [name] spironolactone (ALDACTONE) 25 MG tablet Take 1 tablet (25 mg total) by mouth daily.  30 tablet  6   No current facility-administered medications on file prior to visit.     Past Medical History  Diagnosis Date  . CHF (congestive heart failure)   . COPD (chronic obstructive pulmonary disease)   . ETOH abuse   . Tobacco abuse   . End stage renal disease   . Hypertension   . IgA nephropathy   . Non-ST elevation MI (NSTEMI)   . Colon polyps   . Anemia   . Tobacco abuse      Past Surgical History  Procedure Laterality Date  . Cardiac catheterization      RCA 90% and calcified mid LAD 80% Stenosis     Family History  Problem Relation Age of Onset  . Heart disease  Father   . Heart disease Brother      History   Social History  . Marital Status: Married    Spouse Name: N/A    Number of Children: N/A  . Years of Education: N/A   Occupational History  . Not on file.   Social History Main Topics  . Smoking status: Current Every Day Smoker -- 0.50 packs/day for 30 years    Types: Cigarettes  . Smokeless tobacco: Not on file  . Alcohol Use: 3.6 oz/week    6 Cans of beer per week     Comment: daily  . Drug Use: No  . Sexual Activity: Not on file   Other Topics Concern  . Not on file   Social History Narrative  . No narrative on file    PHYSICAL EXAM   BP 158/88  Pulse 64  Ht  5\' 9"  (1.753 m)  Wt 163 lb 8 oz (74.163 kg)  BMI 24.13 kg/m2 Constitutional: He is oriented to person, place, and time. He appears well-developed and well-nourished. No distress.  HENT: No nasal discharge.  Head: Normocephalic and atraumatic.  Eyes: Pupils are equal and round. Right eye exhibits no discharge. Left eye exhibits no discharge.  Neck: Normal range of motion. Neck supple. No JVD present. No thyromegaly present.  Cardiovascular: Normal rate, regular rhythm, normal heart sounds and. Exam reveals no gallop and no friction rub. No murmur heard.  Pulmonary/Chest: Effort normal and breath sounds normal. No stridor. No respiratory distress. He has no wheezes. He has no rales. He exhibits no tenderness.  Abdominal: Soft. Bowel sounds are normal. He exhibits no distension. There is no tenderness. There is no rebound and no guarding.  Musculoskeletal: Normal range of motion. He exhibits trace edema .  Neurological: He is alert and oriented to person, place, and time. Coordination normal.  Skin: Skin is warm and dry. Possible spider angiomas noted on the chest. He is not diaphoretic. No erythema. No pallor.  Psychiatric: He has a normal mood and affect. His behavior is normal. Judgment and thought content normal.  Right groin is intact with no hematoma or tenderness   EKG: Normal sinus rhythm with left ventricular hypertrophy  ASSESSMENT AND PLAN

## 2013-11-16 NOTE — Assessment & Plan Note (Signed)
Blood pressure improved significantly after adjusting his medications.

## 2013-11-16 NOTE — Patient Instructions (Signed)
Continue same medications.   Refer to cardiothoracic surgery at [facility] for CABG.

## 2013-11-16 NOTE — Assessment & Plan Note (Signed)
Continue treatment with atorvastatin. 

## 2013-11-16 NOTE — Assessment & Plan Note (Signed)
He appears to be euvolemic on current medications.

## 2013-11-20 ENCOUNTER — Encounter: Payer: PRIVATE HEALTH INSURANCE | Admitting: Cardiovascular Disease

## 2013-11-21 ENCOUNTER — Encounter: Payer: Self-pay | Admitting: Surgery

## 2013-11-21 ENCOUNTER — Institutional Professional Consult (permissible substitution) (INDEPENDENT_AMBULATORY_CARE_PROVIDER_SITE_OTHER): Payer: PRIVATE HEALTH INSURANCE | Admitting: Surgery

## 2013-11-21 ENCOUNTER — Other Ambulatory Visit: Payer: Self-pay | Admitting: *Deleted

## 2013-11-21 VITALS — BP 154/85 | HR 68 | Resp 16 | Ht 69.0 in | Wt 165.0 lb

## 2013-11-21 DIAGNOSIS — I2510 Atherosclerotic heart disease of native coronary artery without angina pectoris: Secondary | ICD-10-CM

## 2013-11-21 MED ORDER — LOSARTAN POTASSIUM 100 MG PO TABS: 100.0000 mg | ORAL_TABLET | Freq: Every day | ORAL | Status: DC

## 2013-11-21 MED ORDER — AMLODIPINE BESYLATE 10 MG PO TABS: 10.0000 mg | ORAL_TABLET | Freq: Every day | ORAL | Status: DC

## 2013-11-21 MED ORDER — NITROGLYCERIN 0.4 MG SL SUBL: 0.4000 mg | SUBLINGUAL_TABLET | SUBLINGUAL | Status: DC | PRN

## 2013-11-21 MED ORDER — HYDRALAZINE HCL 50 MG PO TABS: 50.0000 mg | ORAL_TABLET | Freq: Three times a day (TID) | ORAL | Status: DC

## 2013-11-21 MED ORDER — ATORVASTATIN CALCIUM 20 MG PO TABS: 20.0000 mg | ORAL_TABLET | Freq: Every day | ORAL | Status: DC

## 2013-11-21 MED ORDER — METOPROLOL TARTRATE 100 MG PO TABS: 100.0000 mg | ORAL_TABLET | Freq: Two times a day (BID) | ORAL | Status: DC

## 2013-11-21 NOTE — Telephone Encounter (Signed)
Requested Prescriptions   Signed Prescriptions Disp Refills  . amLODipine (NORVASC) 10 MG tablet 30 tablet 3    Sig: Take 1 tablet (10 mg total) by mouth daily.    Authorizing Provider: Kathlyn Sacramento A    Ordering User: Othelia Pulling C  . metoprolol (LOPRESSOR) 100 MG tablet 60 tablet 3    Sig: Take 1 tablet (100 mg total) by mouth 2 (two) times daily.    Authorizing Provider: Kathlyn Sacramento A    Ordering User: Eugenio Hoes, MARINA C  . nitroGLYCERIN (NITROSTAT) 0.4 MG SL tablet 25 tablet 1    Sig: Place 1 tablet (0.4 mg total) under the tongue every 5 (five) minutes as needed for chest pain.    Authorizing Provider: Kathlyn Sacramento A    Ordering User: Eugenio Hoes, MARINA C  . hydrALAZINE (APRESOLINE) 50 MG tablet 90 tablet 3    Sig: Take 1 tablet (50 mg total) by mouth every 8 (eight) hours.    Authorizing Provider: Kathlyn Sacramento A    Ordering User: Othelia Pulling C  . losartan (COZAAR) 100 MG tablet 30 tablet 3    Sig: Take 1 tablet (100 mg total) by mouth daily.    Authorizing Provider: Kathlyn Sacramento A    Ordering User: Eugenio Hoes, MARINA C  . atorvastatin (LIPITOR) 20 MG tablet 30 tablet 3    Sig: Take 1 tablet (20 mg total) by mouth daily.    Authorizing Provider: Kathlyn Sacramento A    Ordering User: Britt Bottom

## 2013-11-22 ENCOUNTER — Encounter (HOSPITAL_COMMUNITY): Payer: Self-pay | Admitting: Pharmacy Technician

## 2013-11-24 ENCOUNTER — Encounter: Payer: Self-pay | Admitting: Surgery

## 2013-11-24 NOTE — Progress Notes (Signed)
Cardiothoracic Surgery Consultation   PCP is No primary provider on file. Referring Provider is Wellington Hampshire, MD  Chief Complaint  Patient presents with  . Coronary Artery Disease    eval for CABG...CATHED 10/30/13...2D ECHO 10/29/13    HPI:  The patient is a 53 year old gentleman with a history of hypertension, hyperlipidemia,  active smoking and COPD, ETOH abuse, and ESRD due to IgA nephropathy who is on hemodialysis M,W,F since February 2015. He presented a few days after starting HD with severe dyspnea at rest and was noted to be very hypertensive to 216/124 and was hypoxemic with pulmonary edema. He also noted dark stools and was anemic with a hemoglobin of 8.6. His Troponin was elevated to 1.5 with a BNP of greater than 100,000. An echo showed normal LV systolic funtion with mild MR and moderate TR. He improved significantly with HD to remove fluid, BP control and transfusion. Cardiac cath showed severe 3-vessel coronary disease. There was a heavily calcified mid LAD stenosis. The RCA had 90% diffuse proximal to mid stenosis and the LCX had 70% OM1 stenosis and 70% distal stenosis.He underwent EGD and colonoscopy by Dr. Allen [name] which showed multiple colon polyps which were resected. He had 14 polyps removed that were thought to be benign but I don't know the path for sure.   Past Medical History  Diagnosis Date  . CHF (congestive heart failure)   . COPD (chronic obstructive pulmonary disease)   . ETOH abuse   . Tobacco abuse   . End stage renal disease   . IgA nephropathy   . Non-ST elevation MI (NSTEMI)   . Colon polyps   . Anemia   . Tobacco abuse   . Coronary artery disease     Non-ST elevation myocardial infarction in February of 2015. In the setting of hypertensive urgency and blood loss anemia. Cardiac catheterization showed significant three-vessel coronary artery disease.. The coronary arteries were moderately to heavily calcified. Ejection fraction was 55% by echo.     . Hyperlipidemia   . Hypertension   . Lower GI bleed     Due to colon polyps. Status post resection of 14 polyps    Past Surgical History  Procedure Laterality Date  . Cardiac catheterization      RCA 90% and calcified mid LAD 80% Stenosis    Family History  Problem Relation Age of Onset  . Heart disease Father   . Heart disease Brother     Social History History  Substance Use Topics  . Smoking status: Current Every Day Smoker -- 0.50 packs/day for 30 years    Types: Cigarettes  . Smokeless tobacco: Never Used  . Alcohol Use: 3.6 oz/week    6 Cans of beer per week     Comment: daily    Current Outpatient Prescriptions  Medication Sig Dispense Refill  . aspirin 325 MG tablet Take 325 mg by mouth daily.      . furosemide (LASIX) 80 MG tablet Take 80 mg by mouth 2 (two) times daily.      [name] spironolactone (ALDACTONE) 25 MG tablet Take 1 tablet (25 mg total) by mouth daily.  30 tablet  6  . amLODipine (NORVASC) 10 MG tablet Take 1 tablet (10 mg total) by mouth daily.  30 tablet  3  . atorvastatin (LIPITOR) 20 MG tablet Take 1 tablet (20 mg total) by mouth daily.  30 tablet  3  . hydrALAZINE (APRESOLINE) 50 MG tablet Take 1  tablet (50 mg total) by mouth every 8 (eight) hours.  90 tablet  3  . ibuprofen (ADVIL,MOTRIN) 200 MG tablet Take 400-600 mg by mouth every 6 (six) hours as needed for headache (pain).      [name] losartan (COZAAR) 100 MG tablet Take 1 tablet (100 mg total) by mouth daily.  30 tablet  3  . metoprolol (LOPRESSOR) 100 MG tablet Take 1 tablet (100 mg total) by mouth 2 (two) times daily.  60 tablet  3  . nitroGLYCERIN (NITROSTAT) 0.4 MG SL tablet Place 1 tablet (0.4 mg total) under the tongue every 5 (five) minutes as needed for chest pain.  25 tablet  1   No current facility-administered medications for this visit.    No Known Allergies  Review of Systems  Constitutional: Positive for appetite change, fatigue and unexpected weight change. Negative for fever  and chills.       Appetite improving since starting HD. Weight has been stable since starting HD.  HENT: Negative.   Eyes: Negative.   Respiratory: Positive for cough.        Dyspnea on exertion  Cardiovascular: Positive for chest pain, palpitations and leg swelling.  Gastrointestinal: Negative.   Endocrine: Negative.   Genitourinary:       ESRD on HD. He does make urine.  Musculoskeletal: Negative.   Skin: Negative.   Allergic/Immunologic: Negative.   Neurological: Negative.   Hematological: Bruises/bleeds easily.  Psychiatric/Behavioral: Negative.     BP 154/85  Pulse 68  Resp 16  Ht 5\' 9"  (1.753 m)  Wt 165 lb (74.844 kg)  BMI 24.36 kg/m2  SpO2 98% Physical Exam  Constitutional: He is oriented to person, place, and time.  Chronically ill appearing gentleman in no distress who looks older than his age.  HENT:  Head: Normocephalic and atraumatic.  Mouth/Throat: Oropharynx is clear and moist.  Poor dentition  Eyes: EOM are normal. Pupils are equal, round, and reactive to light.  Neck: Normal range of motion. Neck supple. JVD present. No thyromegaly present.  Right IJ HD tunneled HD catheter  Cardiovascular: Normal rate, regular rhythm, normal heart sounds and intact distal pulses.   No murmur heard. Pulmonary/Chest: Effort normal and breath sounds normal. No respiratory distress. He has no rales.  Abdominal: Bowel sounds are normal. He exhibits no distension and no mass. There is no tenderness.  Musculoskeletal: Normal range of motion. He exhibits edema.  In feet and ankles bilat  Lymphadenopathy:    He has no cervical adenopathy.  Neurological: He is alert and oriented to person, place, and time. He has normal strength. No cranial nerve deficit or sensory deficit.  Skin: Skin is warm and dry.  Psychiatric: He has a normal mood and affect.     Diagnostic Tests:  Cath films and echo from [facility] reviewed by me through the cloud.  Impression:  He has  severe 3-vessel coronary artery disease with normal LV systolic function. I agree that CABG is the best treatment for this young gentleman on HD with calcified stenoses. He had moderate TR on his last echo with good RV function and normal size. He had no other valvular abnormalities, normal LV funtion, and diastolic dysfunction. We will evaluate his TR in the OR with TEE but I would not repair this unless it was severe since he is on HD and a tricuspid annuloplasty ring would have a significant risk of infection. I discussed the operative procedure with the patient and family including alternatives, benefits and  risks; including but not limited to bleeding, blood transfusion, infection, stroke, myocardial infarction, graft failure, heart block requiring a permanent pacemaker, organ dysfunction, and death.  Daryl Weeks understands and agrees to proceed.  We will schedule surgery for 11/29/2013.  Plan:  CABG on 11/29/2013

## 2013-11-27 ENCOUNTER — Encounter (HOSPITAL_COMMUNITY): Payer: Self-pay

## 2013-11-27 ENCOUNTER — Ambulatory Visit (HOSPITAL_COMMUNITY)
Admission: RE | Admit: 2013-11-27 | Discharge: 2013-11-27 | Disposition: A | Discharge status: Home / Self Care | Payer: PRIVATE HEALTH INSURANCE | Source: Ambulatory Visit | Attending: Surgery | Admitting: Surgery

## 2013-11-27 ENCOUNTER — Encounter (HOSPITAL_COMMUNITY)
Admission: RE | Admit: 2013-11-27 | Discharge: 2013-11-27 | Disposition: A | Discharge status: Home / Self Care | Payer: PRIVATE HEALTH INSURANCE | Source: Ambulatory Visit | Attending: Surgery | Admitting: Surgery

## 2013-11-27 VITALS — BP 156/84 | HR 68 | Temp 97.8°F | Resp 18 | Ht 67.0 in | Wt 158.5 lb

## 2013-11-27 DIAGNOSIS — I2510 Atherosclerotic heart disease of native coronary artery without angina pectoris: Secondary | ICD-10-CM

## 2013-11-27 HISTORY — DX: Shortness of breath: R06.02

## 2013-11-27 HISTORY — DX: Pneumonia, unspecified organism: J18.9

## 2013-11-27 HISTORY — DX: Gastro-esophageal reflux disease without esophagitis: K21.9

## 2013-11-27 HISTORY — DX: Unspecified osteoarthritis, unspecified site: M19.90

## 2013-11-27 LAB — COMPREHENSIVE METABOLIC PANEL
ALBUMIN: 2.2 g/dL — AB (ref 3.5–5.2)
ALT: 6 U/L (ref 0–53)
AST: 13 U/L (ref 0–37)
Alkaline Phosphatase: 87 U/L (ref 39–117)
BUN: 20 mg/dL (ref 6–23)
CALCIUM: 8.4 mg/dL (ref 8.4–10.5)
CO2: 21 mEq/L (ref 19–32)
Chloride: 97 mEq/L (ref 96–112)
Creatinine, Ser: 4.35 mg/dL — ABNORMAL HIGH (ref 0.50–1.35)
GFR calc Af Amer: 17 mL/min — ABNORMAL LOW (ref >90)
GFR calc non Af Amer: 15 mL/min — ABNORMAL LOW (ref >90)
GLUCOSE: 94 mg/dL (ref 70–99)
Potassium: 3.9 mEq/L (ref 3.7–5.3)
Sodium: 135 mEq/L — ABNORMAL LOW (ref 137–147)
TOTAL PROTEIN: 6.5 g/dL (ref 6.0–8.3)
Total Bilirubin: <0.2 mg/dL — ABNORMAL LOW (ref 0.3–1.2)

## 2013-11-27 LAB — PULMONARY FUNCTION TEST
DL/VA % pred: 61 %
DL/VA: 2.72 ml/min/mmHg/L
DLCO COR: 17.36 ml/min/mmHg
DLCO UNC: 17.36 ml/min/mmHg
DLCO cor % pred: 61 %
DLCO unc % pred: 61 %
FEF 25-75 PRE: 1.02 L/s
FEF 25-75 Post: 1.69 L/sec
FEF2575-%Change-Post: 65 %
FEF2575-%Pred-Post: 53 %
FEF2575-%Pred-Pre: 32 %
FEV1-%CHANGE-POST: 16 %
FEV1-%PRED-PRE: 58 %
FEV1-%Pred-Post: 67 %
FEV1-Post: 2.4 L
FEV1-Pre: 2.07 L
FEV1FVC-%Change-Post: 1 %
FEV1FVC-%Pred-Pre: 79 %
FEV6-%CHANGE-POST: 14 %
FEV6-%PRED-POST: 85 %
FEV6-%PRED-PRE: 74 %
FEV6-POST: 3.74 L
FEV6-PRE: 3.28 L
FEV6FVC-%Change-Post: 0 %
FEV6FVC-%PRED-POST: 102 %
FEV6FVC-%PRED-PRE: 102 %
FVC-%Change-Post: 14 %
FVC-%PRED-POST: 83 %
FVC-%PRED-PRE: 73 %
FVC-PRE: 3.34 L
FVC-Post: 3.81 L
POST FEV1/FVC RATIO: 63 %
POST FEV6/FVC RATIO: 98 %
Pre FEV1/FVC ratio: 62 %
Pre FEV6/FVC Ratio: 98 %
RV % pred: 88 %
RV: 1.67 L
TLC % PRED: 83 %
TLC: 5.3 L

## 2013-11-27 LAB — BLOOD GAS, ARTERIAL
Acid-Base Excess: 1.5 mmol/L (ref 0.0–2.0)
BICARBONATE: 25.3 meq/L — AB (ref 20.0–24.0)
Drawn by: 206361
FIO2: 0.21 %
O2 SAT: 95.4 %
PATIENT TEMPERATURE: 98.6
TCO2: 26.4 mmol/L (ref 0–100)
pCO2 arterial: 38 mmHg (ref 35.0–45.0)
pH, Arterial: 7.438 (ref 7.350–7.450)
pO2, Arterial: 72.9 mmHg — ABNORMAL LOW (ref 80.0–100.0)

## 2013-11-27 LAB — URINALYSIS, ROUTINE W REFLEX MICROSCOPIC
Bilirubin Urine: NEGATIVE
GLUCOSE, UA: 100 mg/dL — AB
Ketones, ur: NEGATIVE mg/dL
Leukocytes, UA: NEGATIVE
Nitrite: NEGATIVE
PH: 7.5 (ref 5.0–8.0)
Protein, ur: >300 mg/dL — AB
SPECIFIC GRAVITY, URINE: 1.012 (ref 1.005–1.030)
UROBILINOGEN UA: 0.2 mg/dL (ref 0.0–1.0)

## 2013-11-27 LAB — URINE MICROSCOPIC-ADD ON

## 2013-11-27 LAB — CBC
HCT: 34.5 % — ABNORMAL LOW (ref 39.0–52.0)
HEMOGLOBIN: 11.6 g/dL — AB (ref 13.0–17.0)
MCH: 32.5 pg (ref 26.0–34.0)
MCHC: 33.6 g/dL (ref 30.0–36.0)
MCV: 96.6 fL (ref 78.0–100.0)
Platelets: 354 10*3/uL (ref 150–400)
RBC: 3.57 MIL/uL — ABNORMAL LOW (ref 4.22–5.81)
RDW: 14.4 % (ref 11.5–15.5)
WBC: 8.4 10*3/uL (ref 4.0–10.5)

## 2013-11-27 LAB — HEMOGLOBIN A1C
HEMOGLOBIN A1C: 4.8 % (ref <5.7)
Mean Plasma Glucose: 91 mg/dL (ref <117)

## 2013-11-27 LAB — APTT: aPTT: 36 seconds (ref 24–37)

## 2013-11-27 LAB — SURGICAL PCR SCREEN
MRSA, PCR: NEGATIVE
Staphylococcus aureus: POSITIVE — AB

## 2013-11-27 LAB — ABO/RH: ABO/RH(D): A POS

## 2013-11-27 LAB — PROTIME-INR
INR: 0.99 (ref 0.00–1.49)
Prothrombin Time: 12.9 seconds (ref 11.6–15.2)

## 2013-11-27 MED ORDER — ALBUTEROL SULFATE (2.5 MG/3ML) 0.083% IN NEBU
2.5000 mg | INHALATION_SOLUTION | Freq: Once | RESPIRATORY_TRACT | Status: AC
  Administered 2013-11-27: 2.5 mg via RESPIRATORY_TRACT

## 2013-11-27 NOTE — Progress Notes (Signed)
Anesthesia Chart Review: Patient is a 53 year old male scheduled for CABG, possible TV repair on 11/29/13 by Dr. Cyndia Bent.    History includes NSTEMI 10/2013 (in the setting of hypertensive emergency with hypoxemia and pulmonary edema and anemia with lower GI bleed), CAD, moderate TR, CHF, smoking, HTN, HLD, IgA nephropathy wtih ESRD (started on HD MWF in 10/2013), ETOH abuse, lower GI bleed s/p EGD with colonoscopy with 14 polyps resected (Dr. Allen [name]). Cardiologist is Dr. Kathlyn Sacramento.   EKG on 11/16/13 showed NSR, moderate voltage criteria for LVH.  Cardiac cath on 10/30/13 showed: Severe 3-vessel coronary disease. There was a heavily calcified mid LAD 80% stenosis. 20% LM. The RCA had 90% diffuse proximal to mid stenosis and the LCX had 60% OM1 stenosis and 40% distal CX, RV marginal 90%.  Echo on 10/30/2011 showed LVEF 60-65%. Normal global left ventricular systolic function. Impaired relaxation pattern of LV diastolic filling. Moderate LVH. Moderately dilated left atrium. Mild mitral valve regurgitation. Moderate tricuspid regurgitation. Severely increased left ventricular posterior wall thickness.  Preliminary carotid duplex results from 11/27/13 showed 1-39% bilateral ICA stenosis, antegrade vertebral artery flow.  CXR on 11/27/13 showed no active cardiopulmonary disease.  No significant change. Dual lumen right IJ catheter.  Preoperative PFTs and labs noted.  A1C is pending. He will get an ISTAT on arrival.  If results are acceptable then I would anticipate that he could proceed as planned.  George Hugh [facility] Short Stay Center/Anesthesiology Phone [phone] 11/27/2013 5:40 PM

## 2013-11-27 NOTE — Pre-Procedure Instructions (Addendum)
Daryl Weeks  11/27/2013   Your procedure is scheduled on: Thursday, November 29, 2013   Report to [facility] (use Main Entrance "A'') at 5:30 AM.  Call this number if you have problems the morning of surgery: [phone]   Remember:   Do not eat food or drink liquids after midnight on Wednesday   Take these medicines the morning of surgery with A SIP OF WATER: amLODipine (NORVASC) 10 MG tablet, hydrALAZINE (APRESOLINE) 50 MG tablet, metoprolol (LOPRESSOR) 100 MG tablet, aspirin if inst by dr,  if needed: nitroGLYCERIN (NITROSTAT) 0.4 MG SL tablet for chest pain  Do not take any NSAIDs ie: Ibuprofen, Advil, Naproxen or Aleve as of today    Do not wear jewelry, make-up or nail polish.  Do not wear lotions, powders, or perfumes. You may NOT wear deodorant.  Do not shave 48 hours prior to surgery. Men may shave face and neck.  Do not bring valuables to the hospital.  [facility] is not responsible for any belongings or valuables.               Contacts, dentures or bridgework may not be worn into surgery.  Leave suitcase in the car. After surgery it may be brought to your room.  For patients admitted to the hospital, discharge time is determined by your  treatment team.               Patients discharged the day of surgery will not be allowed to drive home.  Name and phone number of your driver:   Special Instructions:  Special Instructions:Special Instructions: [facility] - Preparing for Surgery  Before surgery, you can play an important role.  Because skin is not sterile, your skin needs to be as free of germs as possible.  You can reduce the number of germs on you skin by washing with CHG (chlorahexidine gluconate) soap before surgery.  CHG is an antiseptic cleaner which kills germs and bonds with the skin to continue killing germs even after washing.  Please DO NOT use if you have an allergy to CHG or antibacterial soaps.  If your skin becomes reddened/irritated stop using the  CHG and inform your nurse when you arrive at Short Stay.  Do not shave (including legs and underarms) for at least 48 hours prior to the first CHG shower.  You may shave your face.  Please follow these instructions carefully:   1.  Shower with CHG Soap the night before surgery and the morning of Surgery.  2.  If you choose to wash your hair, wash your hair first as usual with your normal shampoo.  3.  After you shampoo, rinse your hair and body thoroughly to remove the Shampoo.  4.  Use CHG as you would any other liquid soap.  You can apply chg directly  to the skin and wash gently with scrungie or a clean washcloth.  5.  Apply the CHG Soap to your body ONLY FROM THE NECK DOWN.  Do not use on open wounds or open sores.  Avoid contact with your eyes, ears, mouth and genitals (private parts).  Wash genitals (private parts) with your normal soap.  6.  Wash thoroughly, paying special attention to the area where your surgery will be performed.  7.  Thoroughly rinse your body with warm water from the neck down.  8.  DO NOT shower/wash with your normal soap after using and rinsing off the CHG Soap.  9.  Pat yourself  dry with a clean towel.            10.  Wear clean pajamas.            11.  Place clean sheets on your bed the night of your first shower and do not sleep with pets.  Day of Surgery  Do not apply any lotions/deodorants the morning of surgery.  Please wear clean clothes to the hospital/surgery center. Please practice using the Incentive Spirometer breathing device and bring it with you on the day of surgery along with the incentive spirometer instruction sheet and the Cardiac Surgery Booklet.  Please read over the following fact sheets that you were given: Pain Booklet, Coughing and Deep Breathing, Blood Transfusion Information, Open Heart Packet, MRSA Information and Surgical Site Infection Prevention

## 2013-11-27 NOTE — Progress Notes (Addendum)
VASCULAR LAB PRELIMINARY  PRELIMINARY  PRELIMINARY  PRELIMINARY  Pre-op Cardiac Surgery  Carotid Findings:  Bilateral:  1-39% ICA stenosis.  Vertebral artery flow is antegrade.     Ralene Cork, RVT 11/27/2013 10:48 AM    Upper Extremity Right Left  Brachial Pressures 160 158  Radial Waveforms triphasic triphasic  Ulnar Waveforms triphasic triphasic  Palmar Arch (Allen's Test) Obliterates with radial compression Normal with radial and ulnar compression   Findings:      Lower  Extremity Right Left  Dorsalis Pedis 180 triphasic 164 triphasic  Anterior Tibial    Posterior Tibial 170 triphasic 180 triphasic  Ankle/Brachial Indices      Findings:

## 2013-11-28 MED ORDER — PHENYLEPHRINE HCL 10 MG/ML IJ SOLN
30.0000 ug/min | INTRAVENOUS | Status: AC
  Administered 2013-11-29: 15 ug/min via INTRAVENOUS
  Filled 2013-11-28: qty 2

## 2013-11-28 MED ORDER — METOPROLOL TARTRATE 12.5 MG HALF TABLET: 12.5000 mg | ORAL_TABLET | Freq: Once | ORAL | Status: DC

## 2013-11-28 MED ORDER — DEXTROSE 5 % IV SOLN
750.0000 mg | INTRAVENOUS | Status: DC
  Filled 2013-11-28: qty 750

## 2013-11-28 MED ORDER — DEXTROSE 5 % IV SOLN
1.5000 g | INTRAVENOUS | Status: AC
  Administered 2013-11-29: .75 g via INTRAVENOUS
  Administered 2013-11-29: 1.5 g via INTRAVENOUS
  Filled 2013-11-28 (×2): qty 1.5

## 2013-11-28 MED ORDER — POTASSIUM CHLORIDE 2 MEQ/ML IV SOLN
80.0000 meq | INTRAVENOUS | Status: DC
  Filled 2013-11-28: qty 40

## 2013-11-28 MED ORDER — PLASMA-LYTE 148 IV SOLN
INTRAVENOUS | Status: AC
  Administered 2013-11-29: 09:00:00
  Filled 2013-11-28: qty 2.5

## 2013-11-28 MED ORDER — VANCOMYCIN HCL 10 G IV SOLR
1250.0000 mg | INTRAVENOUS | Status: AC
  Administered 2013-11-29: 1250 mg via INTRAVENOUS
  Filled 2013-11-28 (×2): qty 1250

## 2013-11-28 MED ORDER — MAGNESIUM SULFATE 50 % IJ SOLN
40.0000 meq | INTRAMUSCULAR | Status: DC
  Filled 2013-11-28: qty 10

## 2013-11-28 MED ORDER — EPINEPHRINE HCL 1 MG/ML IJ SOLN
0.5000 ug/min | INTRAVENOUS | Status: DC
  Filled 2013-11-28: qty 4

## 2013-11-28 MED ORDER — SODIUM CHLORIDE 0.9 % IV SOLN
INTRAVENOUS | Status: AC
  Administered 2013-11-29: 69.8 mL/h via INTRAVENOUS
  Filled 2013-11-28: qty 40

## 2013-11-28 MED ORDER — SODIUM CHLORIDE 0.9 % IV SOLN
INTRAVENOUS | Status: AC
  Administered 2013-11-29: .7 [IU]/h via INTRAVENOUS
  Filled 2013-11-28: qty 1

## 2013-11-28 MED ORDER — DOPAMINE-DEXTROSE 3.2-5 MG/ML-% IV SOLN
2.0000 ug/kg/min | INTRAVENOUS | Status: DC
  Filled 2013-11-28: qty 250

## 2013-11-28 MED ORDER — SODIUM CHLORIDE 0.9 % IV SOLN
INTRAVENOUS | Status: DC
  Filled 2013-11-28: qty 30

## 2013-11-28 MED ORDER — NITROGLYCERIN IN D5W 200-5 MCG/ML-% IV SOLN
2.0000 ug/min | INTRAVENOUS | Status: AC
  Administered 2013-11-29: 16.67 ug/min via INTRAVENOUS
  Filled 2013-11-28: qty 250

## 2013-11-28 MED ORDER — DEXMEDETOMIDINE HCL IN NACL 400 MCG/100ML IV SOLN
0.1000 ug/kg/h | INTRAVENOUS | Status: AC
  Administered 2013-11-29: 0.2 ug/kg/h via INTRAVENOUS
  Filled 2013-11-28: qty 100

## 2013-11-28 NOTE — H&P (Signed)
[address]       [place] [address]             [phone]      Cardiothoracic Surgery History and Physical    PCP is No primary provider on file.  Referring Provider is Wellington Hampshire, MD  Chief Complaint   Patient presents with   .  Coronary Artery Disease     eval for CABG...CATHED 10/30/13...2D ECHO 10/29/13   HPI:   The patient is a 53 year old gentleman with a history of hypertension, hyperlipidemia, active smoking and COPD, ETOH abuse, and ESRD due to IgA nephropathy who is on hemodialysis M,W,F since February 2015. He presented a few days after starting HD with severe dyspnea at rest and was noted to be very hypertensive to 216/124 and was hypoxemic with pulmonary edema. He also noted dark stools and was anemic with a hemoglobin of 8.6. His Troponin was elevated to 1.5 with a BNP of greater than 100,000. An echo showed normal LV systolic funtion with mild MR and moderate TR. He improved significantly with HD to remove fluid, BP control and transfusion. Cardiac cath showed severe 3-vessel coronary disease. There was a heavily calcified mid LAD stenosis. The RCA had 90% diffuse proximal to mid stenosis and the LCX had 70% OM1 stenosis and 70% distal stenosis.He underwent EGD and colonoscopy by Dr. Allen [name] which showed multiple colon polyps which were resected. He had 14 polyps removed that were thought to be benign but I don't know the path for sure.   Past Medical History   Diagnosis  Date   .  CHF (congestive heart failure)    .  COPD (chronic obstructive pulmonary disease)    .  ETOH abuse    .  Tobacco abuse    .  End stage renal disease    .  IgA nephropathy    .  Non-ST elevation MI (NSTEMI)    .  Colon polyps    .  Anemia    .  Tobacco abuse    .  Coronary artery disease      Non-ST elevation myocardial infarction in February of 2015. In the setting of hypertensive urgency and blood loss anemia. Cardiac catheterization showed significant  three-vessel coronary artery disease.. The coronary arteries were moderately to heavily calcified. Ejection fraction was 55% by echo.   .  Hyperlipidemia    .  Hypertension    .  Lower GI bleed      Due to colon polyps. Status post resection of 14 polyps    Past Surgical History   Procedure  Laterality  Date   .  Cardiac catheterization       RCA 90% and calcified mid LAD 80% Stenosis    Family History   Problem  Relation  Age of Onset   .  Heart disease  Father    .  Heart disease  Brother    Social History  History   Substance Use Topics   .  Smoking status:  Current Every Day Smoker -- 0.50 packs/day for 30 years     Types:  Cigarettes   .  Smokeless tobacco:  Never Used   .  Alcohol Use:  3.6 oz/week     6 Cans of beer per week      Comment: daily    Current Outpatient Prescriptions   Medication  Sig  Dispense  Refill   .  aspirin 325 MG tablet  Take 325 mg by mouth daily.     .  furosemide (LASIX) 80 MG tablet  Take 80 mg by mouth 2 (two) times daily.     [name]  spironolactone (ALDACTONE) 25 MG tablet  Take 1 tablet (25 mg total) by mouth daily.  30 tablet  6   .  amLODipine (NORVASC) 10 MG tablet  Take 1 tablet (10 mg total) by mouth daily.  30 tablet  3   .  atorvastatin (LIPITOR) 20 MG tablet  Take 1 tablet (20 mg total) by mouth daily.  30 tablet  3   .  hydrALAZINE (APRESOLINE) 50 MG tablet  Take 1 tablet (50 mg total) by mouth every 8 (eight) hours.  90 tablet  3   .  ibuprofen (ADVIL,MOTRIN) 200 MG tablet  Take 400-600 mg by mouth every 6 (six) hours as needed for headache (pain).     [name]  losartan (COZAAR) 100 MG tablet  Take 1 tablet (100 mg total) by mouth daily.  30 tablet  3   .  metoprolol (LOPRESSOR) 100 MG tablet  Take 1 tablet (100 mg total) by mouth 2 (two) times daily.  60 tablet  3   .  nitroGLYCERIN (NITROSTAT) 0.4 MG SL tablet  Place 1 tablet (0.4 mg total) under the tongue every 5 (five) minutes as needed for chest pain.  25 tablet  1    No current  facility-administered medications for this visit.   No Known Allergies  Review of Systems  Constitutional: Positive for appetite change, fatigue and unexpected weight change. Negative for fever and chills.  Appetite improving since starting HD. Weight has been stable since starting HD.  HENT: Negative.  Eyes: Negative.  Respiratory: Positive for cough.  Dyspnea on exertion  Cardiovascular: Positive for chest pain, palpitations and leg swelling.  Gastrointestinal: Negative.  Endocrine: Negative.  Genitourinary:  ESRD on HD. He does make urine.  Musculoskeletal: Negative.  Skin: Negative.  Allergic/Immunologic: Negative.  Neurological: Negative.  Hematological: Bruises/bleeds easily.  Psychiatric/Behavioral: Negative.  BP 154/85  Pulse 68  Resp 16  Ht 5\' 9"  (1.753 m)  Wt 165 lb (74.844 kg)  BMI 24.36 kg/m2  SpO2 98%  Physical Exam  Constitutional: He is oriented to person, place, and time.  Chronically ill appearing gentleman in no distress who looks older than his age.  HENT:  Head: Normocephalic and atraumatic.  Mouth/Throat: Oropharynx is clear and moist.  Poor dentition  Eyes: EOM are normal. Pupils are equal, round, and reactive to light.  Neck: Normal range of motion. Neck supple. JVD present. No thyromegaly present.  Right IJ HD tunneled HD catheter  Cardiovascular: Normal rate, regular rhythm, normal heart sounds and intact distal pulses.  No murmur heard.  Pulmonary/Chest: Effort normal and breath sounds normal. No respiratory distress. He has no rales.  Abdominal: Bowel sounds are normal. He exhibits no distension and no mass. There is no tenderness.  Musculoskeletal: Normal range of motion. He exhibits edema.  In feet and ankles bilat  Lymphadenopathy:  He has no cervical adenopathy.  Neurological: He is alert and oriented to person, place, and time. He has normal strength. No cranial nerve deficit or sensory deficit.  Skin: Skin is warm and dry.  Psychiatric:  He has a normal mood and affect.   Diagnostic Tests:   Cath films and echo from [facility] reviewed by me through the cloud.    Impression:   He has severe 3-vessel coronary  artery disease with normal LV systolic function. I agree that CABG is the best treatment for this young gentleman on HD with calcified stenoses. He had moderate TR on his last echo with good RV function and normal size. He had no other valvular abnormalities, normal LV funtion, and diastolic dysfunction. We will evaluate his TR in the OR with TEE but I would not repair this unless it was severe since he is on HD and a tricuspid annuloplasty ring would have a significant risk of infection. I discussed the operative procedure with the patient and family including alternatives, benefits and risks; including but not limited to bleeding, blood transfusion, infection, stroke, myocardial infarction, graft failure, heart block requiring a permanent pacemaker, organ dysfunction, and death. [name] understands and agrees to proceed. We will schedule surgery for 11/29/2013.   Plan:   CABG

## 2013-11-29 ENCOUNTER — Encounter (HOSPITAL_COMMUNITY)
Admission: RE | Disposition: A | Discharge status: Skilled Nursing Facility | Payer: Medicaid Other | Source: Ambulatory Visit | Attending: Surgery

## 2013-11-29 ENCOUNTER — Encounter (HOSPITAL_COMMUNITY): Payer: Self-pay | Admitting: *Deleted

## 2013-11-29 ENCOUNTER — Inpatient Hospital Stay (HOSPITAL_COMMUNITY)
Admission: RE | Admit: 2013-11-29 | Discharge: 2013-12-07 | DRG: 235 | Disposition: A | Discharge status: Skilled Nursing Facility | Payer: PRIVATE HEALTH INSURANCE | Source: Ambulatory Visit | Attending: Surgery | Admitting: Surgery

## 2013-11-29 ENCOUNTER — Inpatient Hospital Stay (HOSPITAL_COMMUNITY): Payer: PRIVATE HEALTH INSURANCE

## 2013-11-29 ENCOUNTER — Inpatient Hospital Stay (HOSPITAL_COMMUNITY): Payer: PRIVATE HEALTH INSURANCE | Admitting: Certified Registered"

## 2013-11-29 ENCOUNTER — Encounter (HOSPITAL_COMMUNITY): Payer: PRIVATE HEALTH INSURANCE | Admitting: Vascular Surgery

## 2013-11-29 DIAGNOSIS — I120 Hypertensive chronic kidney disease with stage 5 chronic kidney disease or end stage renal disease: Secondary | ICD-10-CM | POA: Diagnosis present

## 2013-11-29 DIAGNOSIS — Z951 Presence of aortocoronary bypass graft: Secondary | ICD-10-CM

## 2013-11-29 DIAGNOSIS — I079 Rheumatic tricuspid valve disease, unspecified: Secondary | ICD-10-CM | POA: Diagnosis present

## 2013-11-29 DIAGNOSIS — I4892 Unspecified atrial flutter: Secondary | ICD-10-CM | POA: Diagnosis not present

## 2013-11-29 DIAGNOSIS — F17200 Nicotine dependence, unspecified, uncomplicated: Secondary | ICD-10-CM | POA: Diagnosis present

## 2013-11-29 DIAGNOSIS — K219 Gastro-esophageal reflux disease without esophagitis: Secondary | ICD-10-CM | POA: Diagnosis present

## 2013-11-29 DIAGNOSIS — I2510 Atherosclerotic heart disease of native coronary artery without angina pectoris: Secondary | ICD-10-CM

## 2013-11-29 DIAGNOSIS — J4489 Other specified chronic obstructive pulmonary disease: Secondary | ICD-10-CM | POA: Diagnosis present

## 2013-11-29 DIAGNOSIS — I509 Heart failure, unspecified: Secondary | ICD-10-CM | POA: Diagnosis present

## 2013-11-29 DIAGNOSIS — J9819 Other pulmonary collapse: Secondary | ICD-10-CM | POA: Diagnosis not present

## 2013-11-29 DIAGNOSIS — I4891 Unspecified atrial fibrillation: Secondary | ICD-10-CM | POA: Diagnosis not present

## 2013-11-29 DIAGNOSIS — Z992 Dependence on renal dialysis: Secondary | ICD-10-CM

## 2013-11-29 DIAGNOSIS — I5032 Chronic diastolic (congestive) heart failure: Secondary | ICD-10-CM | POA: Diagnosis present

## 2013-11-29 DIAGNOSIS — F1010 Alcohol abuse, uncomplicated: Secondary | ICD-10-CM | POA: Diagnosis present

## 2013-11-29 DIAGNOSIS — J449 Chronic obstructive pulmonary disease, unspecified: Secondary | ICD-10-CM | POA: Diagnosis present

## 2013-11-29 DIAGNOSIS — Z8249 Family history of ischemic heart disease and other diseases of the circulatory system: Secondary | ICD-10-CM

## 2013-11-29 DIAGNOSIS — Z7982 Long term (current) use of aspirin: Secondary | ICD-10-CM

## 2013-11-29 DIAGNOSIS — N186 End stage renal disease: Secondary | ICD-10-CM | POA: Diagnosis present

## 2013-11-29 DIAGNOSIS — Z79899 Other long term (current) drug therapy: Secondary | ICD-10-CM

## 2013-11-29 DIAGNOSIS — E785 Hyperlipidemia, unspecified: Secondary | ICD-10-CM | POA: Diagnosis present

## 2013-11-29 DIAGNOSIS — D62 Acute posthemorrhagic anemia: Secondary | ICD-10-CM | POA: Diagnosis not present

## 2013-11-29 DIAGNOSIS — I252 Old myocardial infarction: Secondary | ICD-10-CM

## 2013-11-29 DIAGNOSIS — Z602 Problems related to living alone: Secondary | ICD-10-CM

## 2013-11-29 HISTORY — PX: CORONARY ARTERY BYPASS GRAFT: SHX141

## 2013-11-29 HISTORY — PX: INTRAOPERATIVE TRANSESOPHAGEAL ECHOCARDIOGRAM: SHX5062

## 2013-11-29 LAB — POCT I-STAT 3, ART BLOOD GAS (G3+)
ACID-BASE EXCESS: 6 mmol/L — AB (ref 0.0–2.0)
ACID-BASE EXCESS: 1 mmol/L (ref 0.0–2.0)
Acid-Base Excess: 6 mmol/L — ABNORMAL HIGH (ref 0.0–2.0)
Acid-Base Excess: 4 mmol/L — ABNORMAL HIGH (ref 0.0–2.0)
Acid-Base Excess: 1 mmol/L (ref 0.0–2.0)
Bicarbonate: 31.6 mEq/L — ABNORMAL HIGH (ref 20.0–24.0)
Bicarbonate: 29.9 mEq/L — ABNORMAL HIGH (ref 20.0–24.0)
Bicarbonate: 31.4 mEq/L — ABNORMAL HIGH (ref 20.0–24.0)
Bicarbonate: 27 mEq/L — ABNORMAL HIGH (ref 20.0–24.0)
Bicarbonate: 25.8 mEq/L — ABNORMAL HIGH (ref 20.0–24.0)
O2 SAT: 100 %
O2 SAT: 99 %
O2 Saturation: 100 %
O2 Saturation: 85 %
O2 Saturation: 96 %
PCO2 ART: 51.4 mmHg — AB (ref 35.0–45.0)
PCO2 ART: 50 mmHg — AB (ref 35.0–45.0)
PCO2 ART: 54.2 mmHg — AB (ref 35.0–45.0)
PH ART: 7.412 (ref 7.350–7.450)
PO2 ART: 287 mmHg — AB (ref 80.0–100.0)
PO2 ART: 462 mmHg — AB (ref 80.0–100.0)
PO2 ART: 53 mmHg — AB (ref 80.0–100.0)
TCO2: 33 mmol/L (ref 0–100)
TCO2: 32 mmol/L (ref 0–100)
TCO2: 33 mmol/L (ref 0–100)
TCO2: 28 mmol/L (ref 0–100)
TCO2: 27 mmol/L (ref 0–100)
pCO2 arterial: 46.1 mmHg — ABNORMAL HIGH (ref 35.0–45.0)
pCO2 arterial: 40.1 mmHg (ref 35.0–45.0)
pH, Arterial: 7.396 (ref 7.350–7.450)
pH, Arterial: 7.35 (ref 7.350–7.450)
pH, Arterial: 7.405 (ref 7.350–7.450)
pH, Arterial: 7.376 (ref 7.350–7.450)
pO2, Arterial: 145 mmHg — ABNORMAL HIGH (ref 80.0–100.0)
pO2, Arterial: 74 mmHg — ABNORMAL LOW (ref 80.0–100.0)

## 2013-11-29 LAB — POCT I-STAT 4, (NA,K, GLUC, HGB,HCT)
GLUCOSE: 86 mg/dL (ref 70–99)
GLUCOSE: 99 mg/dL (ref 70–99)
GLUCOSE: 96 mg/dL (ref 70–99)
Glucose, Bld: 83 mg/dL (ref 70–99)
Glucose, Bld: 95 mg/dL (ref 70–99)
Glucose, Bld: 84 mg/dL (ref 70–99)
Glucose, Bld: 86 mg/dL (ref 70–99)
HCT: 31 % — ABNORMAL LOW (ref 39.0–52.0)
HCT: 27 % — ABNORMAL LOW (ref 39.0–52.0)
HCT: 26 % — ABNORMAL LOW (ref 39.0–52.0)
HCT: 28 % — ABNORMAL LOW (ref 39.0–52.0)
HEMATOCRIT: 34 % — AB (ref 39.0–52.0)
HEMATOCRIT: 30 % — AB (ref 39.0–52.0)
HEMATOCRIT: 26 % — AB (ref 39.0–52.0)
HEMOGLOBIN: 11.6 g/dL — AB (ref 13.0–17.0)
HEMOGLOBIN: 10.5 g/dL — AB (ref 13.0–17.0)
HEMOGLOBIN: 10.2 g/dL — AB (ref 13.0–17.0)
HEMOGLOBIN: 8.8 g/dL — AB (ref 13.0–17.0)
Hemoglobin: 9.2 g/dL — ABNORMAL LOW (ref 13.0–17.0)
Hemoglobin: 8.8 g/dL — ABNORMAL LOW (ref 13.0–17.0)
Hemoglobin: 9.5 g/dL — ABNORMAL LOW (ref 13.0–17.0)
POTASSIUM: 4.6 meq/L (ref 3.7–5.3)
Potassium: 4.2 mEq/L (ref 3.7–5.3)
Potassium: 3.8 mEq/L (ref 3.7–5.3)
Potassium: 4 mEq/L (ref 3.7–5.3)
Potassium: 4.1 mEq/L (ref 3.7–5.3)
Potassium: 4.5 mEq/L (ref 3.7–5.3)
Potassium: 4.4 mEq/L (ref 3.7–5.3)
SODIUM: 138 meq/L (ref 137–147)
SODIUM: 138 meq/L (ref 137–147)
SODIUM: 135 meq/L — AB (ref 137–147)
Sodium: 137 mEq/L (ref 137–147)
Sodium: 138 mEq/L (ref 137–147)
Sodium: 135 mEq/L — ABNORMAL LOW (ref 137–147)
Sodium: 135 mEq/L — ABNORMAL LOW (ref 137–147)

## 2013-11-29 LAB — GLUCOSE, CAPILLARY
GLUCOSE-CAPILLARY: 106 mg/dL — AB (ref 70–99)
GLUCOSE-CAPILLARY: 75 mg/dL (ref 70–99)
Glucose-Capillary: 74 mg/dL (ref 70–99)
Glucose-Capillary: 85 mg/dL (ref 70–99)
Glucose-Capillary: 102 mg/dL — ABNORMAL HIGH (ref 70–99)

## 2013-11-29 LAB — CBC
HCT: 26.5 % — ABNORMAL LOW (ref 39.0–52.0)
HCT: 30.3 % — ABNORMAL LOW (ref 39.0–52.0)
HEMOGLOBIN: 9.9 g/dL — AB (ref 13.0–17.0)
Hemoglobin: 8.9 g/dL — ABNORMAL LOW (ref 13.0–17.0)
MCH: 32.8 pg (ref 26.0–34.0)
MCH: 32.6 pg (ref 26.0–34.0)
MCHC: 33.6 g/dL (ref 30.0–36.0)
MCHC: 32.7 g/dL (ref 30.0–36.0)
MCV: 97.8 fL (ref 78.0–100.0)
MCV: 99.7 fL (ref 78.0–100.0)
Platelets: 218 10*3/uL (ref 150–400)
Platelets: 279 10*3/uL (ref 150–400)
RBC: 2.71 MIL/uL — AB (ref 4.22–5.81)
RBC: 3.04 MIL/uL — AB (ref 4.22–5.81)
RDW: 14.8 % (ref 11.5–15.5)
RDW: 15 % (ref 11.5–15.5)
WBC: 11.2 10*3/uL — AB (ref 4.0–10.5)
WBC: 13.6 10*3/uL — ABNORMAL HIGH (ref 4.0–10.5)

## 2013-11-29 LAB — POCT I-STAT, CHEM 8
BUN: 21 mg/dL (ref 6–23)
Calcium, Ion: 1.22 mmol/L (ref 1.12–1.23)
Chloride: 100 mEq/L (ref 96–112)
Creatinine, Ser: 4.5 mg/dL — ABNORMAL HIGH (ref 0.50–1.35)
Glucose, Bld: 92 mg/dL (ref 70–99)
HEMATOCRIT: 31 % — AB (ref 39.0–52.0)
Hemoglobin: 10.5 g/dL — ABNORMAL LOW (ref 13.0–17.0)
POTASSIUM: 4.8 meq/L (ref 3.7–5.3)
SODIUM: 133 meq/L — AB (ref 137–147)
TCO2: 22 mmol/L (ref 0–100)

## 2013-11-29 LAB — PLATELET COUNT: Platelets: 224 10*3/uL (ref 150–400)

## 2013-11-29 LAB — HEMOGLOBIN AND HEMATOCRIT, BLOOD
HEMATOCRIT: 22.7 % — AB (ref 39.0–52.0)
Hemoglobin: 7.6 g/dL — ABNORMAL LOW (ref 13.0–17.0)

## 2013-11-29 LAB — PROTIME-INR
INR: 1.31 (ref 0.00–1.49)
PROTHROMBIN TIME: 16 s — AB (ref 11.6–15.2)

## 2013-11-29 LAB — CREATININE, SERUM
Creatinine, Ser: 4.19 mg/dL — ABNORMAL HIGH (ref 0.50–1.35)
GFR calc non Af Amer: 15 mL/min — ABNORMAL LOW (ref >90)
GFR, EST AFRICAN AMERICAN: 18 mL/min — AB (ref >90)

## 2013-11-29 LAB — APTT: aPTT: 35 seconds (ref 24–37)

## 2013-11-29 LAB — MAGNESIUM: Magnesium: 1.9 mg/dL (ref 1.5–2.5)

## 2013-11-29 SURGERY — CORONARY ARTERY BYPASS GRAFTING (CABG)
Anesthesia: General | Site: Chest

## 2013-11-29 MED ORDER — POTASSIUM CHLORIDE 10 MEQ/50ML IV SOLN: 10.0000 meq | INTRAVENOUS | Status: AC

## 2013-11-29 MED ORDER — SODIUM CHLORIDE 0.9 % IV SOLN
INTRAVENOUS | Status: DC
  Filled 2013-11-29: qty 1

## 2013-11-29 MED ORDER — HEPARIN SODIUM (PORCINE) 1000 UNIT/ML IJ SOLN
INTRAMUSCULAR | Status: AC
  Filled 2013-11-29: qty 1

## 2013-11-29 MED ORDER — PROTAMINE SULFATE 10 MG/ML IV SOLN
INTRAVENOUS | Status: DC | PRN
  Administered 2013-11-29: 20 mg via INTRAVENOUS
  Administered 2013-11-29: 50 mg via INTRAVENOUS
  Administered 2013-11-29: 30 mg via INTRAVENOUS
  Administered 2013-11-29: 20 mg via INTRAVENOUS
  Administered 2013-11-29: 50 mg via INTRAVENOUS
  Administered 2013-11-29: 30 mg via INTRAVENOUS
  Administered 2013-11-29 (×2): 50 mg via INTRAVENOUS

## 2013-11-29 MED ORDER — FENTANYL CITRATE 0.05 MG/ML IJ SOLN
INTRAMUSCULAR | Status: AC
Start: 2013-11-29 — End: 2013-11-29
  Filled 2013-11-29: qty 5

## 2013-11-29 MED ORDER — ASPIRIN EC 325 MG PO TBEC
325.0000 mg | DELAYED_RELEASE_TABLET | Freq: Every day | ORAL | Status: DC
  Administered 2013-11-30 – 2013-12-03 (×4): 325 mg via ORAL
  Filled 2013-11-29 (×4): qty 1

## 2013-11-29 MED ORDER — DARBEPOETIN ALFA-POLYSORBATE 100 MCG/0.5ML IJ SOLN
100.0000 ug | INTRAMUSCULAR | Status: DC
  Administered 2013-12-07: 60 ug via INTRAVENOUS
  Filled 2013-11-29 (×2): qty 0.5

## 2013-11-29 MED ORDER — MAGNESIUM SULFATE 4000MG/100ML IJ SOLN: 4.0000 g | Freq: Once | INTRAMUSCULAR | Status: DC

## 2013-11-29 MED ORDER — ROCURONIUM BROMIDE 100 MG/10ML IV SOLN
INTRAVENOUS | Status: DC | PRN
  Administered 2013-11-29 (×2): 50 mg via INTRAVENOUS

## 2013-11-29 MED ORDER — BISACODYL 10 MG RE SUPP
10.0000 mg | Freq: Every day | RECTAL | Status: DC
Start: 2013-11-30 — End: 2013-12-03

## 2013-11-29 MED ORDER — LIDOCAINE HCL (CARDIAC) 20 MG/ML IV SOLN
INTRAVENOUS | Status: AC
  Filled 2013-11-29: qty 5

## 2013-11-29 MED ORDER — MORPHINE SULFATE 2 MG/ML IJ SOLN
2.0000 mg | INTRAMUSCULAR | Status: DC | PRN
  Administered 2013-11-30: 2 mg via INTRAVENOUS
  Filled 2013-11-29: qty 1

## 2013-11-29 MED ORDER — FENTANYL CITRATE 0.05 MG/ML IJ SOLN
INTRAMUSCULAR | Status: AC
  Filled 2013-11-29: qty 5

## 2013-11-29 MED ORDER — SUCCINYLCHOLINE CHLORIDE 20 MG/ML IJ SOLN
INTRAMUSCULAR | Status: AC
  Filled 2013-11-29: qty 1

## 2013-11-29 MED ORDER — DOCUSATE SODIUM 100 MG PO CAPS
200.0000 mg | ORAL_CAPSULE | Freq: Every day | ORAL | Status: DC
  Administered 2013-12-01 – 2013-12-03 (×2): 200 mg via ORAL
  Filled 2013-11-29 (×2): qty 2

## 2013-11-29 MED ORDER — INSULIN ASPART 100 UNIT/ML ~~LOC~~ SOLN
0.0000 [IU] | SUBCUTANEOUS | Status: DC
  Administered 2013-11-30: 2 [IU] via SUBCUTANEOUS

## 2013-11-29 MED ORDER — DEXTROSE 5 % IV SOLN
1.5000 g | Freq: Two times a day (BID) | INTRAVENOUS | Status: AC
  Administered 2013-11-29 – 2013-12-01 (×4): 1.5 g via INTRAVENOUS
  Filled 2013-11-29 (×4): qty 1.5

## 2013-11-29 MED ORDER — PANTOPRAZOLE SODIUM 40 MG PO TBEC
40.0000 mg | DELAYED_RELEASE_TABLET | Freq: Every day | ORAL | Status: DC
  Administered 2013-12-01 – 2013-12-03 (×3): 40 mg via ORAL
  Filled 2013-11-29 (×3): qty 1

## 2013-11-29 MED ORDER — FENTANYL CITRATE 0.05 MG/ML IJ SOLN
INTRAMUSCULAR | Status: DC | PRN
  Administered 2013-11-29: 100 ug via INTRAVENOUS
  Administered 2013-11-29: 150 ug via INTRAVENOUS
  Administered 2013-11-29: 100 ug via INTRAVENOUS
  Administered 2013-11-29 (×2): 150 ug via INTRAVENOUS
  Administered 2013-11-29: 100 ug via INTRAVENOUS
  Administered 2013-11-29: 150 ug via INTRAVENOUS
  Administered 2013-11-29: 100 ug via INTRAVENOUS
  Administered 2013-11-29 (×2): 50 ug via INTRAVENOUS
  Administered 2013-11-29: 150 ug via INTRAVENOUS
  Administered 2013-11-29 (×2): 100 ug via INTRAVENOUS
  Administered 2013-11-29: 50 ug via INTRAVENOUS

## 2013-11-29 MED ORDER — SODIUM CHLORIDE 0.9 % IV SOLN: INTRAVENOUS | Status: DC

## 2013-11-29 MED ORDER — HEMOSTATIC AGENTS (NO CHARGE) OPTIME
TOPICAL | Status: DC | PRN
  Administered 2013-11-29: 1 via TOPICAL

## 2013-11-29 MED ORDER — MORPHINE SULFATE 2 MG/ML IJ SOLN
1.0000 mg | INTRAMUSCULAR | Status: AC | PRN
  Administered 2013-11-29: 2 mg via INTRAVENOUS
  Filled 2013-11-29: qty 2

## 2013-11-29 MED ORDER — ROCURONIUM BROMIDE 50 MG/5ML IV SOLN
INTRAVENOUS | Status: AC
  Filled 2013-11-29: qty 2

## 2013-11-29 MED ORDER — INSULIN REGULAR BOLUS VIA INFUSION
0.0000 [IU] | Freq: Three times a day (TID) | INTRAVENOUS | Status: DC
  Filled 2013-11-29: qty 10

## 2013-11-29 MED ORDER — MIDAZOLAM HCL 10 MG/2ML IJ SOLN
INTRAMUSCULAR | Status: AC
  Filled 2013-11-29: qty 2

## 2013-11-29 MED ORDER — VECURONIUM BROMIDE 10 MG IV SOLR
INTRAVENOUS | Status: AC
  Filled 2013-11-29: qty 10

## 2013-11-29 MED ORDER — OXYCODONE HCL 5 MG PO TABS
5.0000 mg | ORAL_TABLET | ORAL | Status: DC | PRN
  Administered 2013-11-30: 10 mg via ORAL
  Administered 2013-11-30: 5 mg via ORAL
  Filled 2013-11-29 (×2): qty 2

## 2013-11-29 MED ORDER — LIDOCAINE HCL (CARDIAC) 20 MG/ML IV SOLN
INTRAVENOUS | Status: DC | PRN
  Administered 2013-11-29: 60 mg via INTRAVENOUS

## 2013-11-29 MED ORDER — SODIUM CHLORIDE 0.9 % IV SOLN: 250.0000 mL | INTRAVENOUS | Status: DC

## 2013-11-29 MED ORDER — ACETAMINOPHEN 160 MG/5ML PO SOLN
1000.0000 mg | Freq: Four times a day (QID) | ORAL | Status: DC
  Administered 2013-11-30: 1000 mg
  Filled 2013-11-29: qty 40.6

## 2013-11-29 MED ORDER — ARTIFICIAL TEARS OP OINT
TOPICAL_OINTMENT | OPHTHALMIC | Status: DC | PRN
  Administered 2013-11-29: 1 via OPHTHALMIC

## 2013-11-29 MED ORDER — VANCOMYCIN HCL IN DEXTROSE 1-5 GM/200ML-% IV SOLN
1000.0000 mg | Freq: Once | INTRAVENOUS | Status: AC
  Administered 2013-11-29: 1000 mg via INTRAVENOUS
  Filled 2013-11-29: qty 200

## 2013-11-29 MED ORDER — LACTATED RINGERS IV SOLN: 500.0000 mL | Freq: Once | INTRAVENOUS | Status: AC | PRN

## 2013-11-29 MED ORDER — METOPROLOL TARTRATE 1 MG/ML IV SOLN: 2.5000 mg | INTRAVENOUS | Status: DC | PRN

## 2013-11-29 MED ORDER — ATORVASTATIN CALCIUM 20 MG PO TABS
20.0000 mg | ORAL_TABLET | Freq: Every day | ORAL | Status: DC
  Administered 2013-11-29 – 2013-12-07 (×9): 20 mg via ORAL
  Filled 2013-11-29 (×12): qty 1

## 2013-11-29 MED ORDER — ACETAMINOPHEN 650 MG RE SUPP
650.0000 mg | Freq: Once | RECTAL | Status: AC
  Administered 2013-11-29: 650 mg via RECTAL

## 2013-11-29 MED ORDER — MIDAZOLAM HCL 5 MG/5ML IJ SOLN
INTRAMUSCULAR | Status: DC | PRN
  Administered 2013-11-29: 4 mg via INTRAVENOUS
  Administered 2013-11-29 (×3): 2 mg via INTRAVENOUS

## 2013-11-29 MED ORDER — SODIUM CHLORIDE 0.9 % IV SOLN
INTRAVENOUS | Status: DC | PRN
  Administered 2013-11-29 (×3): via INTRAVENOUS

## 2013-11-29 MED ORDER — ACETAMINOPHEN 160 MG/5ML PO SOLN: 650.0000 mg | Freq: Once | ORAL | Status: AC

## 2013-11-29 MED ORDER — PHENYLEPHRINE 40 MCG/ML (10ML) SYRINGE FOR IV PUSH (FOR BLOOD PRESSURE SUPPORT)
PREFILLED_SYRINGE | INTRAVENOUS | Status: AC
  Filled 2013-11-29: qty 10

## 2013-11-29 MED ORDER — EPHEDRINE SULFATE 50 MG/ML IJ SOLN
INTRAMUSCULAR | Status: AC
  Filled 2013-11-29: qty 1

## 2013-11-29 MED ORDER — LACTATED RINGERS IV SOLN
INTRAVENOUS | Status: DC
Start: 2013-11-29 — End: 2013-12-01

## 2013-11-29 MED ORDER — SODIUM CHLORIDE 0.9 % IJ SOLN
3.0000 mL | Freq: Two times a day (BID) | INTRAMUSCULAR | Status: DC
Start: 2013-11-30 — End: 2013-12-02
  Administered 2013-11-30 – 2013-12-01 (×3): 3 mL via INTRAVENOUS

## 2013-11-29 MED ORDER — PROTAMINE SULFATE 10 MG/ML IV SOLN
INTRAVENOUS | Status: AC
  Filled 2013-11-29: qty 5

## 2013-11-29 MED ORDER — CHLORHEXIDINE GLUCONATE 4 % EX LIQD
30.0000 mL | CUTANEOUS | Status: DC
Start: 2013-11-29 — End: 2013-11-29
  Filled 2013-11-29: qty 30

## 2013-11-29 MED ORDER — FAMOTIDINE IN NACL 20-0.9 MG/50ML-% IV SOLN
20.0000 mg | Freq: Two times a day (BID) | INTRAVENOUS | Status: AC
  Administered 2013-11-29 (×2): 20 mg via INTRAVENOUS
  Filled 2013-11-29: qty 50

## 2013-11-29 MED ORDER — SODIUM CHLORIDE 0.45 % IV SOLN
INTRAVENOUS | Status: DC
  Administered 2013-11-29: 13:00:00 via INTRAVENOUS

## 2013-11-29 MED ORDER — PROPOFOL 10 MG/ML IV BOLUS
INTRAVENOUS | Status: DC | PRN
  Administered 2013-11-29: 80 mg via INTRAVENOUS

## 2013-11-29 MED ORDER — 0.9 % SODIUM CHLORIDE (POUR BTL) OPTIME
TOPICAL | Status: DC | PRN
  Administered 2013-11-29: 6000 mL

## 2013-11-29 MED ORDER — HEPARIN SODIUM (PORCINE) 1000 UNIT/ML IJ SOLN
INTRAMUSCULAR | Status: DC | PRN
  Administered 2013-11-29: 30000 [IU] via INTRAVENOUS

## 2013-11-29 MED ORDER — ALBUMIN HUMAN 5 % IV SOLN
INTRAVENOUS | Status: DC | PRN
  Administered 2013-11-29: 11:00:00 via INTRAVENOUS

## 2013-11-29 MED ORDER — NITROGLYCERIN IN D5W 200-5 MCG/ML-% IV SOLN: 0.0000 ug/min | INTRAVENOUS | Status: DC

## 2013-11-29 MED ORDER — SODIUM CHLORIDE 0.9 % IJ SOLN
INTRAMUSCULAR | Status: AC
  Filled 2013-11-29: qty 20

## 2013-11-29 MED ORDER — VECURONIUM BROMIDE 10 MG IV SOLR
INTRAVENOUS | Status: DC | PRN
  Administered 2013-11-29: 3 mg via INTRAVENOUS
  Administered 2013-11-29: 5 mg via INTRAVENOUS

## 2013-11-29 MED ORDER — METOPROLOL TARTRATE 25 MG/10 ML ORAL SUSPENSION
12.5000 mg | Freq: Two times a day (BID) | ORAL | Status: DC
  Filled 2013-11-29 (×5): qty 5

## 2013-11-29 MED ORDER — METOCLOPRAMIDE HCL 5 MG/ML IJ SOLN
10.0000 mg | Freq: Four times a day (QID) | INTRAMUSCULAR | Status: AC
  Administered 2013-11-29 – 2013-11-30 (×3): 10 mg via INTRAVENOUS
  Filled 2013-11-29 (×3): qty 2

## 2013-11-29 MED ORDER — PROPOFOL 10 MG/ML IV BOLUS
INTRAVENOUS | Status: AC
  Filled 2013-11-29: qty 20

## 2013-11-29 MED ORDER — PHENYLEPHRINE HCL 10 MG/ML IJ SOLN
0.0000 ug/min | INTRAVENOUS | Status: DC
  Filled 2013-11-29: qty 2

## 2013-11-29 MED ORDER — THROMBIN 20000 UNITS EX SOLR
CUTANEOUS | Status: AC
  Filled 2013-11-29: qty 20000

## 2013-11-29 MED ORDER — STERILE WATER FOR INJECTION IJ SOLN
INTRAMUSCULAR | Status: AC
  Filled 2013-11-29: qty 10

## 2013-11-29 MED ORDER — MIDAZOLAM HCL 2 MG/2ML IJ SOLN: 2.0000 mg | INTRAMUSCULAR | Status: DC | PRN

## 2013-11-29 MED ORDER — BISACODYL 5 MG PO TBEC
10.0000 mg | DELAYED_RELEASE_TABLET | Freq: Every day | ORAL | Status: DC
  Administered 2013-12-01 – 2013-12-03 (×2): 10 mg via ORAL
  Filled 2013-11-29 (×2): qty 2

## 2013-11-29 MED ORDER — METOPROLOL TARTRATE 12.5 MG HALF TABLET
12.5000 mg | ORAL_TABLET | Freq: Two times a day (BID) | ORAL | Status: DC
  Administered 2013-11-30 – 2013-12-01 (×3): 12.5 mg via ORAL
  Filled 2013-11-29 (×5): qty 1

## 2013-11-29 MED ORDER — ALBUMIN HUMAN 5 % IV SOLN: 250.0000 mL | INTRAVENOUS | Status: AC | PRN

## 2013-11-29 MED ORDER — ATORVASTATIN CALCIUM 20 MG PO TABS
20.0000 mg | ORAL_TABLET | Freq: Every day | ORAL | Status: DC
  Filled 2013-11-29: qty 1

## 2013-11-29 MED ORDER — ASPIRIN 81 MG PO CHEW: 324.0000 mg | CHEWABLE_TABLET | Freq: Every day | ORAL | Status: DC

## 2013-11-29 MED ORDER — ARTIFICIAL TEARS OP OINT
TOPICAL_OINTMENT | OPHTHALMIC | Status: AC
  Filled 2013-11-29: qty 3.5

## 2013-11-29 MED ORDER — SODIUM CHLORIDE 0.9 % IV SOLN
INTRAVENOUS | Status: DC
  Administered 2013-11-29: 13:00:00 via INTRAVENOUS

## 2013-11-29 MED ORDER — PROTAMINE SULFATE 10 MG/ML IV SOLN
INTRAVENOUS | Status: AC
  Filled 2013-11-29: qty 25

## 2013-11-29 MED ORDER — SODIUM CHLORIDE 0.9 % IJ SOLN: 3.0000 mL | INTRAMUSCULAR | Status: DC | PRN

## 2013-11-29 MED ORDER — ONDANSETRON HCL 4 MG/2ML IJ SOLN: 4.0000 mg | Freq: Four times a day (QID) | INTRAMUSCULAR | Status: DC | PRN

## 2013-11-29 MED ORDER — ACETAMINOPHEN 500 MG PO TABS
1000.0000 mg | ORAL_TABLET | Freq: Four times a day (QID) | ORAL | Status: DC
  Administered 2013-11-30 – 2013-12-03 (×13): 1000 mg via ORAL
  Filled 2013-11-29 (×20): qty 2

## 2013-11-29 MED ORDER — THROMBIN 20000 UNITS EX SOLR
CUTANEOUS | Status: DC | PRN
  Administered 2013-11-29 (×3): via TOPICAL

## 2013-11-29 MED ORDER — DEXMEDETOMIDINE HCL IN NACL 200 MCG/50ML IV SOLN: 0.1000 ug/kg/h | INTRAVENOUS | Status: DC

## 2013-11-29 MED FILL — Heparin Sodium (Porcine) Inj 1000 Unit/ML: INTRAMUSCULAR | Qty: 30 | Status: AC

## 2013-11-29 MED FILL — Sodium Chloride IV Soln 0.9%: INTRAVENOUS | Qty: 2000 | Status: AC

## 2013-11-29 MED FILL — Lidocaine HCl IV Inj 20 MG/ML: INTRAVENOUS | Qty: 5 | Status: AC

## 2013-11-29 MED FILL — Mannitol IV Soln 20%: INTRAVENOUS | Qty: 500 | Status: AC

## 2013-11-29 MED FILL — Electrolyte-R (PH 7.4) Solution: INTRAVENOUS | Qty: 3000 | Status: AC

## 2013-11-29 MED FILL — Sodium Bicarbonate IV Soln 8.4%: INTRAVENOUS | Qty: 50 | Status: AC

## 2013-11-29 SURGICAL SUPPLY — 128 items
ADAPTER CARDIO PERF ANTE/RETRO (ADAPTER) ×3 IMPLANT
ATTRACTOMAT 16X20 MAGNETIC DRP (DRAPES) ×3 IMPLANT
BAG DECANTER FOR FLEXI CONT (MISCELLANEOUS) ×3 IMPLANT
BANDAGE ELASTIC 4 VELCRO ST LF (GAUZE/BANDAGES/DRESSINGS) ×6 IMPLANT
BANDAGE ELASTIC 6 VELCRO ST LF (GAUZE/BANDAGES/DRESSINGS) ×6 IMPLANT
BANDAGE GAUZE ELAST BULKY 4 IN (GAUZE/BANDAGES/DRESSINGS) ×3 IMPLANT
BASKET HEART  (ORDER IN 25'S) (MISCELLANEOUS) ×1
BASKET HEART (ORDER IN 25'S) (MISCELLANEOUS) ×1
BASKET HEART (ORDER IN 25S) (MISCELLANEOUS) ×1 IMPLANT
BLADE STERNUM SYSTEM 6 (BLADE) ×3 IMPLANT
BLADE SURG 11 STRL SS (BLADE) ×3 IMPLANT
BLADE SURG 15 STRL LF DISP TIS (BLADE) ×1 IMPLANT
BLADE SURG 15 STRL SS (BLADE) ×2
BNDG GAUZE ELAST 4 BULKY (GAUZE/BANDAGES/DRESSINGS) ×6 IMPLANT
CANISTER SUCTION 2500CC (MISCELLANEOUS) ×3 IMPLANT
CANN PRFSN 3/8X14X24FR PCFC (MISCELLANEOUS)
CANN PRFSN 3/8XCNCT ST RT ANG (MISCELLANEOUS)
CANNULA GUNDRY RCSP 15FR (MISCELLANEOUS) ×3 IMPLANT
CANNULA PRFSN 3/8X14X24FR PCFC (MISCELLANEOUS) IMPLANT
CANNULA PRFSN 3/8XCNCT RT ANG (MISCELLANEOUS) IMPLANT
CANNULA VEN MTL TIP RT (MISCELLANEOUS)
CARDIAC SUCTION (MISCELLANEOUS) ×3 IMPLANT
CATH ROBINSON RED A/P 18FR (CATHETERS) ×9 IMPLANT
CATH THORACIC 28FR (CATHETERS) ×3 IMPLANT
CATH THORACIC 36FR (CATHETERS) ×3 IMPLANT
CATH THORACIC 36FR RT ANG (CATHETERS) ×3 IMPLANT
CLIP TI MEDIUM 24 (CLIP) IMPLANT
CLIP TI WIDE RED SMALL 24 (CLIP) ×3 IMPLANT
CONN 1/2X1/2X1/2  BEN (MISCELLANEOUS) ×2
CONN 1/2X1/2X1/2 BEN (MISCELLANEOUS) ×1 IMPLANT
CONN 3/8X1/2 ST GISH (MISCELLANEOUS) ×6 IMPLANT
COVER MAYO STAND STRL (DRAPES) ×3 IMPLANT
COVER SURGICAL LIGHT HANDLE (MISCELLANEOUS) ×6 IMPLANT
CRADLE DONUT ADULT HEAD (MISCELLANEOUS) ×3 IMPLANT
DERMABOND ADVANCED (GAUZE/BANDAGES/DRESSINGS) ×4
DERMABOND ADVANCED .7 DNX12 (GAUZE/BANDAGES/DRESSINGS) ×2 IMPLANT
DRAPE CARDIOVASCULAR INCISE (DRAPES) ×2
DRAPE SLUSH/WARMER DISC (DRAPES) ×3 IMPLANT
DRAPE SRG 135X102X78XABS (DRAPES) ×1 IMPLANT
DRSG COVADERM 4X14 (GAUZE/BANDAGES/DRESSINGS) ×3 IMPLANT
ELECT CAUTERY BLADE 6.4 (BLADE) ×3 IMPLANT
ELECT REM PT RETURN 9FT ADLT (ELECTROSURGICAL) ×6
ELECTRODE REM PT RTRN 9FT ADLT (ELECTROSURGICAL) ×2 IMPLANT
GLOVE BIO SURGEON STRL SZ 6 (GLOVE) ×3 IMPLANT
GLOVE BIO SURGEON STRL SZ 6.5 (GLOVE) ×18 IMPLANT
GLOVE BIO SURGEON STRL SZ7 (GLOVE) IMPLANT
GLOVE BIO SURGEON STRL SZ7.5 (GLOVE) IMPLANT
GLOVE BIO SURGEON STRL SZ8 (GLOVE) ×3 IMPLANT
GLOVE BIO SURGEONS STRL SZ 6.5 (GLOVE) ×9
GLOVE BIOGEL PI IND STRL 6 (GLOVE) IMPLANT
GLOVE BIOGEL PI IND STRL 6.5 (GLOVE) IMPLANT
GLOVE BIOGEL PI IND STRL 7.0 (GLOVE) IMPLANT
GLOVE BIOGEL PI INDICATOR 6 (GLOVE)
GLOVE BIOGEL PI INDICATOR 6.5 (GLOVE)
GLOVE BIOGEL PI INDICATOR 7.0 (GLOVE)
GLOVE EUDERMIC 7 POWDERFREE (GLOVE) ×6 IMPLANT
GLOVE ORTHO TXT STRL SZ7.5 (GLOVE) IMPLANT
GOWN STRL REUS W/ TWL LRG LVL3 (GOWN DISPOSABLE) ×4 IMPLANT
GOWN STRL REUS W/ TWL XL LVL3 (GOWN DISPOSABLE) ×1 IMPLANT
GOWN STRL REUS W/TWL LRG LVL3 (GOWN DISPOSABLE) ×8
GOWN STRL REUS W/TWL XL LVL3 (GOWN DISPOSABLE) ×2
HEMOSTAT POWDER SURGIFOAM 1G (HEMOSTASIS) ×9 IMPLANT
HEMOSTAT SURGICEL 2X14 (HEMOSTASIS) ×3 IMPLANT
INSERT FOGARTY 61MM (MISCELLANEOUS) IMPLANT
INSERT FOGARTY XLG (MISCELLANEOUS) ×3 IMPLANT
KIT BASIN OR (CUSTOM PROCEDURE TRAY) ×3 IMPLANT
KIT CATH CPB BARTLE (MISCELLANEOUS) ×3 IMPLANT
KIT ROOM TURNOVER OR (KITS) ×3 IMPLANT
KIT SUCTION CATH 14FR (SUCTIONS) ×3 IMPLANT
KIT VASOVIEW W/TROCAR VH 2000 (KITS) ×3 IMPLANT
NS IRRIG 1000ML POUR BTL (IV SOLUTION) ×18 IMPLANT
PACK OPEN HEART (CUSTOM PROCEDURE TRAY) ×3 IMPLANT
PAD ARMBOARD 7.5X6 YLW CONV (MISCELLANEOUS) ×6 IMPLANT
PAD ELECT DEFIB RADIOL ZOLL (MISCELLANEOUS) ×3 IMPLANT
PENCIL BUTTON HOLSTER BLD 10FT (ELECTRODE) ×3 IMPLANT
PUNCH AORTIC ROTATE 4.0MM (MISCELLANEOUS) IMPLANT
PUNCH AORTIC ROTATE 4.5MM 8IN (MISCELLANEOUS) ×3 IMPLANT
PUNCH AORTIC ROTATE 5MM 8IN (MISCELLANEOUS) IMPLANT
SET CARDIOPLEGIA MPS 5001102 (MISCELLANEOUS) ×3 IMPLANT
SOLUTION ANTI FOG 6CC (MISCELLANEOUS) ×3 IMPLANT
SPONGE GAUZE 4X4 12PLY (GAUZE/BANDAGES/DRESSINGS) ×6 IMPLANT
SPONGE GAUZE 4X4 12PLY STER LF (GAUZE/BANDAGES/DRESSINGS) ×9 IMPLANT
SPONGE INTESTINAL PEANUT (DISPOSABLE) IMPLANT
SPONGE LAP 18X18 X RAY DECT (DISPOSABLE) ×3 IMPLANT
SPONGE LAP 4X18 X RAY DECT (DISPOSABLE) ×3 IMPLANT
SUCKER WEIGHTED FLEX (MISCELLANEOUS) ×3 IMPLANT
SUT BONE WAX W31G (SUTURE) ×3 IMPLANT
SUT ETHIBOND 2 0 SH (SUTURE) IMPLANT
SUT ETHIBOND 2 0 SH 36X2 (SUTURE) IMPLANT
SUT ETHIBOND 2 0 V4 (SUTURE) IMPLANT
SUT ETHIBOND 2 0V4 GREEN (SUTURE) IMPLANT
SUT MNCRL AB 4-0 PS2 18 (SUTURE) ×6 IMPLANT
SUT PROLENE 3 0 SH 1 (SUTURE) ×3 IMPLANT
SUT PROLENE 3 0 SH DA (SUTURE) IMPLANT
SUT PROLENE 3 0 SH1 36 (SUTURE) ×3 IMPLANT
SUT PROLENE 4 0 RB 1 (SUTURE) ×4
SUT PROLENE 4 0 SH DA (SUTURE) IMPLANT
SUT PROLENE 4-0 RB1 .5 CRCL 36 (SUTURE) ×2 IMPLANT
SUT PROLENE 5 0 C 1 36 (SUTURE) IMPLANT
SUT PROLENE 6 0 C 1 30 (SUTURE) IMPLANT
SUT PROLENE 7 0 BV 1 (SUTURE) IMPLANT
SUT PROLENE 7 0 BV1 MDA (SUTURE) ×6 IMPLANT
SUT PROLENE 8 0 BV175 6 (SUTURE) IMPLANT
SUT SILK  1 MH (SUTURE)
SUT SILK 1 MH (SUTURE) IMPLANT
SUT STEEL STERNAL CCS#1 18IN (SUTURE) IMPLANT
SUT STEEL SZ 6 DBL 3X14 BALL (SUTURE) ×9 IMPLANT
SUT VIC AB 1 CTX 27 (SUTURE) ×6 IMPLANT
SUT VIC AB 1 CTX 36 (SUTURE) ×4
SUT VIC AB 1 CTX36XBRD ANBCTR (SUTURE) ×2 IMPLANT
SUT VIC AB 2-0 CT1 27 (SUTURE) ×4
SUT VIC AB 2-0 CT1 TAPERPNT 27 (SUTURE) ×2 IMPLANT
SUT VIC AB 2-0 CTX 27 (SUTURE) IMPLANT
SUT VIC AB 3-0 SH 27 (SUTURE)
SUT VIC AB 3-0 SH 27X BRD (SUTURE) IMPLANT
SUT VIC AB 3-0 X1 27 (SUTURE) IMPLANT
SUT VICRYL 4-0 PS2 18IN ABS (SUTURE) IMPLANT
SUTURE E-PAK OPEN HEART (SUTURE) ×3 IMPLANT
SYSTEM SAHARA CHEST DRAIN ATS (WOUND CARE) ×3 IMPLANT
TAPE CLOTH SURG 4X10 WHT LF (GAUZE/BANDAGES/DRESSINGS) ×3 IMPLANT
TAPE PAPER 3X10 WHT MICROPORE (GAUZE/BANDAGES/DRESSINGS) ×3 IMPLANT
TOWEL OR 17X24 6PK STRL BLUE (TOWEL DISPOSABLE) ×3 IMPLANT
TOWEL OR 17X26 10 PK STRL BLUE (TOWEL DISPOSABLE) ×3 IMPLANT
TRAY FOLEY IC TEMP SENS 14FR (CATHETERS) IMPLANT
TRAY FOLEY IC TEMP SENS 16FR (CATHETERS) ×3 IMPLANT
TUBING INSUFFLATION 10FT LAP (TUBING) ×3 IMPLANT
UNDERPAD 30X30 INCONTINENT (UNDERPADS AND DIAPERS) ×3 IMPLANT
WATER STERILE IRR 1000ML POUR (IV SOLUTION) ×6 IMPLANT

## 2013-11-29 NOTE — OR Nursing (Signed)
1st call to SICU @ 1128

## 2013-11-29 NOTE — Preoperative (Signed)
Beta Blockers   Reason not to administer Beta Blockers:Not Applicable 

## 2013-11-29 NOTE — Interval H&P Note (Signed)
History and Physical Interval Note:  11/29/2013 7:39 AM  [name]  has presented today for surgery, with the diagnosis of CAD  The various methods of treatment have been discussed with the patient and family. After consideration of risks, benefits and other options for treatment, the patient has consented to  Procedure(s): CORONARY ARTERY BYPASS GRAFTING (CABG) (N/A) TRICUSPID VALVE REPAIR (N/A) INTRAOPERATIVE TRANSESOPHAGEAL ECHOCARDIOGRAM (N/A) as a surgical intervention .  The patient's history has been reviewed, patient examined, no change in status, stable for surgery.  I have reviewed the patient's chart and labs.  Questions were answered to the patient's satisfaction.     [name]

## 2013-11-29 NOTE — Progress Notes (Signed)
Echocardiogram 2D Echocardiogram has been performed.  Daryl Weeks 11/29/2013, 9:05 AM

## 2013-11-29 NOTE — Consult Note (Signed)
Daryl Weeks is an 53 y.o. male referred by Dr Daryl Weeks   Chief Complaint: ESRD, anemia, Sec HTPTH HPI: 53yo white male  With ESRD sec IgA nephropathy on HD at [facility] MWF admitted for CABG which was done earlier today. Last HD was yest.  Post Op K 4.4.  On small dose of Neo.  Past Medical History  Diagnosis Date  . CHF (congestive heart failure)   . COPD (chronic obstructive pulmonary disease)   . ETOH abuse   . Tobacco abuse   . Non-ST elevation MI (NSTEMI)   . Colon polyps   . Anemia   . Tobacco abuse   . Coronary artery disease     Non-ST elevation myocardial infarction in February of 2015. In the setting of hypertensive urgency and blood loss anemia. Cardiac catheterization showed significant three-vessel coronary artery disease.. The coronary arteries were moderately to heavily calcified. Ejection fraction was 55% by echo.   . Hyperlipidemia   . Hypertension   . Lower GI bleed     Due to colon polyps. Status post resection of 14 polyps  . Shortness of breath   . Pneumonia     hx  . End stage renal disease     m-w-f   -Daryl Weeks  . IgA nephropathy   . GERD (gastroesophageal reflux disease)     hx  . Arthritis     Past Surgical History  Procedure Laterality Date  . Cardiac catheterization      RCA 90% and calcified mid LAD 80% Stenosis  . Renal biopsy Left 14  . Dialysis catheterr  2/15    Family History  Problem Relation Age of Onset  . Heart disease Father   . Heart disease Brother    Social History:  reports that he has been smoking Cigarettes.  He has a 15 pack-year smoking history. He has never used smokeless tobacco. He reports that he drinks about 3.6 ounces of alcohol per week. He reports that he does not use illicit drugs.  Allergies: No Known Allergies  Medications Prior to Admission  Medication Sig Dispense Refill  . amLODipine (NORVASC) 10 MG tablet Take 1 tablet (10 mg total) by mouth daily.  30 tablet  3  . aspirin 325 MG tablet Take  325 mg by mouth daily.      [name] atorvastatin (LIPITOR) 20 MG tablet Take 1 tablet (20 mg total) by mouth daily.  30 tablet  3  . furosemide (LASIX) 80 MG tablet Take 80 mg by mouth 2 (two) times daily.      . hydrALAZINE (APRESOLINE) 50 MG tablet Take 1 tablet (50 mg total) by mouth every 8 (eight) hours.  90 tablet  3  . ibuprofen (ADVIL,MOTRIN) 200 MG tablet Take 400-600 mg by mouth every 6 (six) hours as needed for headache (pain).      [name] losartan (COZAAR) 100 MG tablet Take 1 tablet (100 mg total) by mouth daily.  30 tablet  3  . metoprolol (LOPRESSOR) 100 MG tablet Take 1 tablet (100 mg total) by mouth 2 (two) times daily.  60 tablet  3  . nitroGLYCERIN (NITROSTAT) 0.4 MG SL tablet Place 1 tablet (0.4 mg total) under the tongue every 5 (five) minutes as needed for chest pain.  25 tablet  1  . spironolactone (ALDACTONE) 25 MG tablet Take 1 tablet (25 mg total) by mouth daily.  30 tablet  6     Lab Results: UA: ND   Recent Labs  11/27/13  1309  11/29/13 1030  11/29/13 1133 11/29/13 1245 11/29/13 1246  WBC 8.4  --   --   --   --  11.2*  --   HGB 11.6*  < > 7.6*  < > 8.8* 8.9* 9.5*  HCT 34.5*  < > 22.7*  < > 26.0* 26.5* 28.0*  PLT 354  --  224  --   --  218  --   < > = values in this interval not displayed. BMET  Recent Labs  11/27/13 1309  11/29/13 1033 11/29/13 1133 11/29/13 1246  NA 135*  < > 135* 135* 135*  K 3.9  < > 4.5 4.6 4.4  CL 97  --   --   --   --   CO2 21  --   --   --   --   GLUCOSE 94  < > 84 96 86  BUN 20  --   --   --   --   CREATININE 4.35*  --   --   --   --   CALCIUM 8.4  --   --   --   --   < > = values in this interval not displayed. LFT  Recent Labs  11/27/13 1309  PROT 6.5  ALBUMIN 2.2*  AST 13  ALT 6  ALKPHOS 87  BILITOT <0.2*   Dg Chest Portable 1 View  11/29/2013   CLINICAL DATA:  Postop CABG  EXAM: PORTABLE CHEST - 1 VIEW  COMPARISON:  DG CHEST 2 VIEW dated 11/27/2013  FINDINGS: The lungs are adequately inflated. There is atelectasis  in the right lower lobe. The left lung is clear. The cardiac silhouette is normal in size. There are 8 intact sternal wires present. The endotracheal tube tip lies approximately 3.2 cm above the crotch of the carina. The esophagogastric tube proximal port lies at the level of the GE junction and advancement by 10 cm is recommended. There is a left internal jugular Cordis sheath through which a Swan-Ganz catheter has been placed. The tip of the catheter lies in the proximal right main pulmonary artery. A mediastinal drain is present with its tip at the level of the fifth ribs. There is a left chest tube whose tip lies over the posterior medial aspect of the left fifth rib. There is a dual lumen dialysis type catheter in place on the right which appears unchanged with its tip in the region of the junction of the SVC with the right atrium.  IMPRESSION: 1. There is increased density in the inferior medial aspect of the right lower lobe consistent with atelectasis. The left lung is clear. 2. The cardiac silhouette and pulmonary vascularity within the limits of normal with no evidence of pulmonary edema. 3. There is no pleural effusion or pneumothorax. 4. The esophagogastric tube needs advancement by at least 10 cm to assure that the proximal port remains below the GE junction. The endotracheal tube tip lies 3.2 cm above the crotch of the carina.   Electronically Signed   By: David  Martinique   On: 11/29/2013 13:26    ROS: No obtainable as pt intubated  PHYSICAL EXAM: Blood pressure 95/67, pulse 75, temperature 97.3 F (36.3 C), temperature source Oral, resp. rate 18, height 5\' 7"  (1.702 m), weight 72 kg (158 lb 11.7 oz), SpO2 98.00%. HEENT: Intubated. NECK: Lt SG catheter, Rt Perm cath LUNGS:Bil wheezes CARDIAC:RRR wo MRG ABD:+ BS Non distended, no HSM EXT:No edema.  Rt leg wrapped in  bandage NEURO:sedated Chest 3 chest tubes and temp pacer in place  Assessment: 1. SP CABG 2. ESRD 3. Anemia 4. Sec  HPTH 5. H x HTN 6.  COPD PLAN: 1. Renal profile in AM 2. Aranesp 178mcg IV q Friday 3. Plan HD in AM if hemodynamically stable.  If not stable and lytes OK then could wait til Sat   Tywana Robotham T 11/29/2013, 3:13 PM

## 2013-11-29 NOTE — Anesthesia Preprocedure Evaluation (Addendum)
Anesthesia Evaluation  Patient identified by MRN, date of birth, ID band Patient awake    Reviewed: Allergy & Precautions, H&P , NPO status , reviewed documented beta blocker date and time   Airway Mallampati: II      Dental   Pulmonary shortness of breath, pneumonia -, COPDCurrent Smoker,  breath sounds clear to auscultation        Cardiovascular hypertension, + CAD, + Past MI and +CHF Rhythm:Regular Rate:Normal     Neuro/Psych    GI/Hepatic GERD-  ,  Endo/Other    Renal/GU Renal disease     Musculoskeletal   Abdominal   Peds  Hematology  (+) anemia ,   Anesthesia Other Findings   Reproductive/Obstetrics                          Anesthesia Physical Anesthesia Plan  ASA: IV  Anesthesia Plan: General   Post-op Pain Management:    Induction: Intravenous  Airway Management Planned: Oral ETT  Additional Equipment:   Intra-op Plan:   Post-operative Plan: Post-operative intubation/ventilation  Informed Consent: I have reviewed the patients History and Physical, chart, labs and discussed the procedure including the risks, benefits and alternatives for the proposed anesthesia with the patient or authorized representative who has indicated his/her understanding and acceptance.   Dental advisory given  Plan Discussed with: CRNA, Anesthesiologist and Surgeon  Anesthesia Plan Comments:        Anesthesia Quick Evaluation

## 2013-11-29 NOTE — OR Nursing (Signed)
2ND CALL TO SICU @1205 

## 2013-11-29 NOTE — Anesthesia Postprocedure Evaluation (Signed)
  Anesthesia Post-op Note  Patient: [name]  Procedure(s) Performed: Procedure(s): CORONARY ARTERY BYPASS GRAFTING (CABG) x 4 using endoscopically harvested right saphenous vein and left internal mammary artery (N/A) INTRAOPERATIVE TRANSESOPHAGEAL ECHOCARDIOGRAM (N/A)  Patient Location: PACU and SICU  Anesthesia Type:General  Level of Consciousness: sedated  Airway and Oxygen Therapy: intubated  Post-op Pain: mild  Post-op Assessment: Post-op Vital signs reviewed  Post-op Vital Signs: Reviewed  Complications: No apparent anesthesia complications

## 2013-11-29 NOTE — Transfer of Care (Signed)
Immediate Anesthesia Transfer of Care Note  Patient: [name]  Procedure(s) Performed: Procedure(s): CORONARY ARTERY BYPASS GRAFTING (CABG) x 4 using endoscopically harvested right saphenous vein and left internal mammary artery (N/A) INTRAOPERATIVE TRANSESOPHAGEAL ECHOCARDIOGRAM (N/A)  Patient Location: SICU  Anesthesia Type:General  Level of Consciousness: sedated, unresponsive and Patient remains intubated per anesthesia plan  Airway & Oxygen Therapy: Patient remains intubated per anesthesia plan and Patient placed on Ventilator (see vital sign flow sheet for setting)  Post-op Assessment: Report given to PACU RN and Post -op Vital signs reviewed and stable  Post vital signs: Reviewed and stable  Complications: No apparent anesthesia complications

## 2013-11-29 NOTE — Anesthesia Postprocedure Evaluation (Signed)
  Anesthesia Post-op Note  Patient: Daryl Weeks  Procedure(s) Performed: Procedure(s): CORONARY ARTERY BYPASS GRAFTING (CABG) x 4 using endoscopically harvested right saphenous vein and left internal mammary artery (N/A) INTRAOPERATIVE TRANSESOPHAGEAL ECHOCARDIOGRAM (N/A)  Patient Location: PACU and SICU  Anesthesia Type:General  Level of Consciousness: Patient remains intubated per anesthesia plan  Airway and Oxygen Therapy: Patient remains intubated per anesthesia plan  Post-op Pain: mild  Post-op Assessment: Post-op Vital signs reviewed  Post-op Vital Signs: Reviewed  Complications: No apparent anesthesia complications

## 2013-11-29 NOTE — Op Note (Signed)
CARDIOVASCULAR SURGERY OPERATIVE NOTE  11/29/2013  Surgeon:  [name], MD  First Assistant: Ellwood Handler,  PA-C   Preoperative Diagnosis:  Severe multi-vessel coronary artery disease   Postoperative Diagnosis:  Same   Procedure:  1. Median Sternotomy 2. Extracorporeal circulation 3.   Coronary artery bypass grafting x 4   Left internal mammary graft to the LAD  Sequential SVG to OM1 and distal LCX  SVG to RCA  4.   Endoscopic vein harvest from the right leg   Anesthesia:  General Endotracheal   Clinical History/Surgical Indication:  The patient is a 53 year old gentleman with a history of hypertension, hyperlipidemia, active smoking and COPD, ETOH abuse, and ESRD due to IgA nephropathy who is on hemodialysis M,W,F since February 2015. He presented a few days after starting HD with severe dyspnea at rest and was noted to be very hypertensive to 216/124 and was hypoxemic with pulmonary edema. He also noted dark stools and was anemic with a hemoglobin of 8.6. His Troponin was elevated to 1.5 with a BNP of greater than 100,000. An echo showed normal LV systolic funtion with mild MR and moderate TR. He improved significantly with HD to remove fluid, BP control and transfusion. Cardiac cath showed severe 3-vessel coronary disease. There was a heavily calcified mid LAD stenosis. The RCA had 90% diffuse proximal to mid stenosis and the LCX had 70% OM1 stenosis and 70% distal stenosis. He has severe 3-vessel coronary artery disease with normal LV systolic function. I agree that CABG is the best treatment for this young gentleman on HD with calcified stenoses. He had moderate TR on his last echo with good RV function and normal size. He had no other valvular abnormalities, normal LV funtion, and diastolic dysfunction. We will evaluate his TR in the OR with TEE but I would not repair this  unless it was severe since he is on HD and a tricuspid annuloplasty ring would have a significant risk of infection. I discussed the operative procedure with the patient and family including alternatives, benefits and risks; including but not limited to bleeding, blood transfusion, infection, stroke, myocardial infarction, graft failure, heart block requiring a permanent pacemaker, organ dysfunction, and death. Daryl Weeks understands and agrees to proceed.     Preparation:  The patient was seen in the preoperative holding area and the correct patient, correct operation were confirmed with the patient after reviewing the medical record and catheterization. The consent was signed by me. Preoperative antibiotics were given. A pulmonary arterial line and radial arterial line were placed by the anesthesia team. The patient was taken back to the operating room and positioned supine on the operating room table. After being placed under general endotracheal anesthesia by the anesthesia team a foley catheter was placed. The neck, chest, abdomen, and both legs were prepped with betadine soap and solution and draped in the usual sterile manner. A surgical time-out was taken and the correct patient and operative procedure were confirmed with the nursing and anesthesia staff.  TEE: Performed by Dr. Finis Bud and reviewed by Dr. Roberts Gaudy. This showed normal LV function with moderate concentric LVH. There was trivial TR and MR, no AS or AI. RV function was normal. PA pressures were normal.   Cardiopulmonary Bypass:  A median sternotomy was performed. The pericardium was opened in the midline. Right ventricular function appeared normal. The ascending aorta was of normal size and had no palpable plaque. There were no contraindications to aortic cannulation or  cross-clamping. The patient was fully systemically heparinized and the ACT was maintained > 400 sec. The proximal aortic arch was cannulated with a 20  F aortic cannula for arterial inflow. Venous cannulation was performed via the right atrial appendage using a two-staged venous cannula. An antegrade cardioplegia/vent cannula was inserted into the mid-ascending aorta. Aortic occlusion was performed with a single cross-clamp. Systemic cooling to 32 degrees Centigrade and topical cooling of the heart with iced saline were used. Hyperkalemic antegrade cold blood cardioplegia was used to induce diastolic arrest and was then given at about 20 minute intervals throughout the period of arrest to maintain myocardial temperature at or below 10 degrees centigrade. A temperature probe was inserted into the interventricular septum and an insulating pad was placed in the pericardium.   Left internal mammary harvest:  The left side of the sternum was retracted using the Rultract retractor. The left internal mammary artery was harvested as a pedicle graft. All side branches were clipped. It was a large-sized vessel of good quality with excellent blood flow. It was ligated distally and divided. It was sprayed with topical papaverine solution to prevent vasospasm.   Endoscopic vein harvest:  The right greater saphenous vein was harvested endoscopically through a 2 cm incision medial to the right knee. It was harvested from the upper thigh to below the knee. It was a large-sized vein of good quality. We examined the left GSV at the knee through a small incision and it was also a large vein so we decided to use the right GSV. The side branches were all ligated with 4-0 silk ties.    Coronary arteries:  The coronary arteries were examined.   LAD:  Large vessel with no distal disease  LCX:  OM1 was a medium-sized vessel with no distal disease. The distal LCX branch was large with moderate diffuse disease.  RCA:  Medium-sized vessel that was heavily diseased throughout the midportion. Just before the PDA branch there was a soft area that was free of disease and  suitable for grafting.   Grafts:  1. LIMA to the LAD: 2.5 mm. It was sewn end to side using 8-0 prolene continuous suture. 2. Sequential SVG to OM1:  1.6 mm. It was sewn sequential side to side using 7-0 prolene continuous suture. 3. Sequential SVG to distal LCX:  1.75 mm. It was sewn sequential end to side using 7-0 prolene continuous suture. 4. SVG to RCA:  1.75 mm. It was sewn end to side using 7-0 prolene continuous suture.  The proximal vein graft anastomoses were performed to the mid-ascending aorta using continuous 6-0 prolene suture. Graft markers were placed around the proximal anastomoses.   Completion:  The patient was rewarmed to 37 degrees Centigrade. The clamp was removed from the LIMA pedicle and there was rapid warming of the septum and return of ventricular fibrillation. The crossclamp was removed with a time of 80 minutes. There was spontaneous return of sinus rhythm. The distal and proximal anastomoses were checked for hemostasis. The position of the grafts was satisfactory. Two temporary epicardial pacing wires were placed on the right atrium and two on the right ventricle. The patient was weaned from CPB without difficulty on no inotropes. CPB time was 99 minutes. Cardiac output was 6 LPM. Heparin was fully reversed with protamine and the aortic and venous cannulas removed. Hemostasis was achieved. Mediastinal and left pleural drainage tubes were placed. The sternum was closed with double #6 stainless steel wires. The fascia was closed with  continuous # 1 vicryl suture. The subcutaneous tissue was closed with 2-0 vicryl continuous suture. The skin was closed with 3-0 vicryl subcuticular suture. All sponge, needle, and instrument counts were reported correct at the end of the case. Dry sterile dressings were placed over the incisions and around the chest tubes which were connected to pleurevac suction. The patient was then transported to the surgical intensive care unit in critical  but stable condition.

## 2013-11-29 NOTE — Progress Notes (Signed)
Patient ID: Sriyan Terp, male   DOB: 13-May-1961, 53 y.o.   MRN: [id number] EVENING ROUNDS NOTE :     [address]       [place] [address]             [phone]                 Day of Surgery Procedure(s) (LRB): CORONARY ARTERY BYPASS GRAFTING (CABG) x 4 using endoscopically harvested right saphenous vein and left internal mammary artery (N/A) INTRAOPERATIVE TRANSESOPHAGEAL ECHOCARDIOGRAM (N/A)  Total Length of Stay:  LOS: 0 days  BP 102/62  Pulse 79  Temp(Src) 99.7 F (37.6 C) (Oral)  Resp 16  Ht 5\' 7"  (1.702 m)  Wt 158 lb 11.7 oz (72 kg)  BMI 24.85 kg/m2  SpO2 100%  .Intake/Output     03/19 0701 - 03/20 0700   I.V. (mL/kg) 428.4 (6)   Blood 220   NG/GT 30   IV Piggyback 500   Total Intake(mL/kg) 1178.4 (16.4)   Urine (mL/kg/hr) 260 (0.3)   Blood 400 (0.4)   Chest Tube 300 (0.3)   Total Output 960   Net +218.4         . sodium chloride 10 mL/hr at 11/29/13 1530  . sodium chloride 10 mL/hr at 11/29/13 1245  . [START ON 11/30/2013] sodium chloride    . sodium chloride 20 mL/hr at 11/29/13 1245  . dexmedetomidine Stopped (11/29/13 1630)  . lactated ringers    . nitroGLYCERIN Stopped (11/29/13 1245)  . phenylephrine (NEO-SYNEPHRINE) Adult infusion 30 mcg/min (11/29/13 1800)     Lab Results  Component Value Date   WBC 13.6* 11/29/2013   HGB 10.5* 11/29/2013   HCT 31.0* 11/29/2013   PLT 279 11/29/2013   GLUCOSE 92 11/29/2013   ALT 6 11/27/2013   AST 13 11/27/2013   NA 133* 11/29/2013   K 4.8 11/29/2013   CL 100 11/29/2013   CREATININE 4.50* 11/29/2013   BUN 21 11/29/2013   CO2 21 11/27/2013   INR 1.31 11/29/2013   HGBA1C 4.8 11/27/2013   Waking up, still on vent K+ 4.8  Grace Isaac MD  Beeper [phone] Office [phone] 11/29/2013 7:22 PM

## 2013-11-29 NOTE — Brief Op Note (Signed)
11/29/2013  10:48 AM  PATIENT:  Daryl Weeks  53 y.o. male  PRE-OPERATIVE DIAGNOSIS:  CAD  POST-OPERATIVE DIAGNOSIS:  CAD  PROCEDURE:  Procedure(s):  CORONARY ARTERY BYPASS GRAFTING (CABG) x 4  -LIMA to LAD -SEQUENTIAL SVG TO OM1 and OM2 -SVG to RCA  ENDOSCOPIC SAPHENOUS VEIN HARVEST RIGHT LEG  INTRAOPERATIVE TRANSESOPHAGEAL ECHOCARDIOGRAM (N/A)  SURGEON:  Surgeon(s) and Role:    * [name], MD - Primary  PHYSICIAN ASSISTANT: Pasco Marchitto PA-C  ANESTHESIA:   general  EBL:  Total I/O In: -  Out: 100 [Urine:100]  BLOOD ADMINISTERED: CELLSAVER  DRAINS: Mediastinal chest drain, Left Pleural chest tube   LOCAL MEDICATIONS USED:  NONE  SPECIMEN:  No Specimen  DISPOSITION OF SPECIMEN:  N/A  COUNTS:  YES  TOURNIQUET:  * No tourniquets in log *  DICTATION: .Dragon Dictation  PLAN OF CARE: Admit to inpatient   PATIENT DISPOSITION:  ICU - intubated and hemodynamically stable.   Delay start of Pharmacological VTE agent (>24hrs) due to surgical blood loss or risk of bleeding: yes

## 2013-11-29 NOTE — Progress Notes (Signed)
Dr. Cyndia Bent in to check on pt. Notified that pt's middle chest tube was clotted- at that time, Dr. Cyndia Bent manipulated the chest tube to release the clot.

## 2013-11-29 NOTE — Progress Notes (Signed)
Called to bedside by RN for dropping sats, pt suctioned for small amount of secretions and FiO2 increased to 60% to maintain sats greater than 90%

## 2013-11-30 ENCOUNTER — Encounter (HOSPITAL_COMMUNITY): Payer: Self-pay | Admitting: Surgery

## 2013-11-30 ENCOUNTER — Inpatient Hospital Stay (HOSPITAL_COMMUNITY): Payer: PRIVATE HEALTH INSURANCE

## 2013-11-30 LAB — CBC
HCT: 28.4 % — ABNORMAL LOW (ref 39.0–52.0)
HEMATOCRIT: 25.6 % — AB (ref 39.0–52.0)
Hemoglobin: 9.2 g/dL — ABNORMAL LOW (ref 13.0–17.0)
Hemoglobin: 8.4 g/dL — ABNORMAL LOW (ref 13.0–17.0)
MCH: 32.5 pg (ref 26.0–34.0)
MCH: 32.4 pg (ref 26.0–34.0)
MCHC: 32.4 g/dL (ref 30.0–36.0)
MCHC: 32.8 g/dL (ref 30.0–36.0)
MCV: 100.4 fL — ABNORMAL HIGH (ref 78.0–100.0)
MCV: 98.8 fL (ref 78.0–100.0)
PLATELETS: 303 10*3/uL (ref 150–400)
PLATELETS: 238 10*3/uL (ref 150–400)
RBC: 2.83 MIL/uL — AB (ref 4.22–5.81)
RBC: 2.59 MIL/uL — AB (ref 4.22–5.81)
RDW: 15.1 % (ref 11.5–15.5)
RDW: 15.1 % (ref 11.5–15.5)
WBC: 20.2 10*3/uL — AB (ref 4.0–10.5)
WBC: 19.6 10*3/uL — ABNORMAL HIGH (ref 4.0–10.5)

## 2013-11-30 LAB — POCT I-STAT 3, ART BLOOD GAS (G3+)
Acid-Base Excess: 6 mmol/L — ABNORMAL HIGH (ref 0.0–2.0)
Acid-base deficit: 1 mmol/L (ref 0.0–2.0)
Acid-base deficit: 3 mmol/L — ABNORMAL HIGH (ref 0.0–2.0)
Acid-base deficit: 3 mmol/L — ABNORMAL HIGH (ref 0.0–2.0)
Acid-base deficit: 4 mmol/L — ABNORMAL HIGH (ref 0.0–2.0)
BICARBONATE: 22.4 meq/L (ref 20.0–24.0)
Bicarbonate: 21.3 mEq/L (ref 20.0–24.0)
Bicarbonate: 23.1 mEq/L (ref 20.0–24.0)
Bicarbonate: 25.1 mEq/L — ABNORMAL HIGH (ref 20.0–24.0)
Bicarbonate: 30.1 mEq/L — ABNORMAL HIGH (ref 20.0–24.0)
O2 Saturation: 88 %
O2 Saturation: 89 %
O2 Saturation: 90 %
O2 Saturation: 91 %
O2 Saturation: 96 %
PCO2 ART: 42.1 mmHg (ref 35.0–45.0)
PCO2 ART: 41.3 mmHg (ref 35.0–45.0)
PH ART: 7.316 — AB (ref 7.350–7.450)
PH ART: 7.334 — AB (ref 7.350–7.450)
PH ART: 7.336 — AB (ref 7.350–7.450)
PH ART: 7.346 — AB (ref 7.350–7.450)
PO2 ART: 63 mmHg — AB (ref 80.0–100.0)
PO2 ART: 90 mmHg (ref 80.0–100.0)
PO2 ART: 66 mmHg — AB (ref 80.0–100.0)
Patient temperature: 37.3
Patient temperature: 36.9
TCO2: 23 mmol/L (ref 0–100)
TCO2: 24 mmol/L (ref 0–100)
TCO2: 24 mmol/L (ref 0–100)
TCO2: 27 mmol/L (ref 0–100)
TCO2: 31 mmol/L (ref 0–100)
pCO2 arterial: 42.1 mmHg (ref 35.0–45.0)
pCO2 arterial: 43.4 mmHg (ref 35.0–45.0)
pCO2 arterial: 46 mmHg — ABNORMAL HIGH (ref 35.0–45.0)
pH, Arterial: 7.47 — ABNORMAL HIGH (ref 7.350–7.450)
pO2, Arterial: 51 mmHg — ABNORMAL LOW (ref 80.0–100.0)
pO2, Arterial: 64 mmHg — ABNORMAL LOW (ref 80.0–100.0)

## 2013-11-30 LAB — BASIC METABOLIC PANEL
BUN: 29 mg/dL — ABNORMAL HIGH (ref 6–23)
CALCIUM: 8.5 mg/dL (ref 8.4–10.5)
CHLORIDE: 97 meq/L (ref 96–112)
CO2: 22 meq/L (ref 19–32)
Creatinine, Ser: 4.9 mg/dL — ABNORMAL HIGH (ref 0.50–1.35)
GFR calc Af Amer: 15 mL/min — ABNORMAL LOW (ref >90)
GFR calc non Af Amer: 13 mL/min — ABNORMAL LOW (ref >90)
Glucose, Bld: 89 mg/dL (ref 70–99)
Potassium: 5.1 mEq/L (ref 3.7–5.3)
SODIUM: 135 meq/L — AB (ref 137–147)

## 2013-11-30 LAB — GLUCOSE, CAPILLARY
GLUCOSE-CAPILLARY: 87 mg/dL (ref 70–99)
Glucose-Capillary: 122 mg/dL — ABNORMAL HIGH (ref 70–99)
Glucose-Capillary: 96 mg/dL (ref 70–99)
Glucose-Capillary: 103 mg/dL — ABNORMAL HIGH (ref 70–99)
Glucose-Capillary: 110 mg/dL — ABNORMAL HIGH (ref 70–99)
Glucose-Capillary: 114 mg/dL — ABNORMAL HIGH (ref 70–99)

## 2013-11-30 LAB — RENAL FUNCTION PANEL
ALBUMIN: 1.9 g/dL — AB (ref 3.5–5.2)
BUN: 27 mg/dL — ABNORMAL HIGH (ref 6–23)
CHLORIDE: 97 meq/L (ref 96–112)
CO2: 20 mEq/L (ref 19–32)
Calcium: 8.5 mg/dL (ref 8.4–10.5)
Creatinine, Ser: 4.81 mg/dL — ABNORMAL HIGH (ref 0.50–1.35)
GFR calc Af Amer: 15 mL/min — ABNORMAL LOW (ref >90)
GFR calc non Af Amer: 13 mL/min — ABNORMAL LOW (ref >90)
Glucose, Bld: 126 mg/dL — ABNORMAL HIGH (ref 70–99)
PHOSPHORUS: 6.5 mg/dL — AB (ref 2.3–4.6)
POTASSIUM: 5.2 meq/L (ref 3.7–5.3)
Sodium: 135 mEq/L — ABNORMAL LOW (ref 137–147)

## 2013-11-30 LAB — CREATININE, SERUM
Creatinine, Ser: 1.61 mg/dL — ABNORMAL HIGH (ref 0.50–1.35)
GFR calc non Af Amer: 48 mL/min — ABNORMAL LOW (ref >90)
GFR, EST AFRICAN AMERICAN: 56 mL/min — AB (ref >90)

## 2013-11-30 LAB — HEPATITIS B SURFACE ANTIGEN: HEP B S AG: NEGATIVE

## 2013-11-30 LAB — MAGNESIUM
MAGNESIUM: 2 mg/dL (ref 1.5–2.5)
Magnesium: 1.7 mg/dL (ref 1.5–2.5)

## 2013-11-30 LAB — POTASSIUM: Potassium: 3.4 mEq/L — ABNORMAL LOW (ref 3.7–5.3)

## 2013-11-30 MED ORDER — LEVALBUTEROL HCL 0.63 MG/3ML IN NEBU
0.6300 mg | INHALATION_SOLUTION | Freq: Three times a day (TID) | RESPIRATORY_TRACT | Status: DC
Start: 2013-11-30 — End: 2013-12-04
  Administered 2013-11-30 – 2013-12-04 (×12): 0.63 mg via RESPIRATORY_TRACT
  Filled 2013-11-30 (×23): qty 3

## 2013-11-30 MED ORDER — DARBEPOETIN ALFA-POLYSORBATE 100 MCG/0.5ML IJ SOLN
INTRAMUSCULAR | Status: AC
  Administered 2013-11-30: 100 ug
  Filled 2013-11-30: qty 0.5

## 2013-11-30 MED ORDER — DEXTROSE 5 % IV SOLN
0.0000 ug/min | INTRAVENOUS | Status: DC
  Administered 2013-11-30: 30 ug/min via INTRAVENOUS
  Filled 2013-11-30: qty 4

## 2013-11-30 MED FILL — Magnesium Sulfate Inj 50%: INTRAMUSCULAR | Qty: 10 | Status: AC

## 2013-11-30 MED FILL — Heparin Sodium (Porcine) Inj 1000 Unit/ML: INTRAMUSCULAR | Qty: 30 | Status: AC

## 2013-11-30 MED FILL — Potassium Chloride Inj 2 mEq/ML: INTRAVENOUS | Qty: 40 | Status: AC

## 2013-11-30 NOTE — Procedures (Signed)
Extubation Procedure Note  Patient Details:   Name: Kanden Wilmott DOB: 1960-09-21 MRN: [id number]   Airway Documentation:     Evaluation  O2 sats: currently acceptable Complications: No apparent complications Patient did tolerate procedure well. Bilateral Breath Sounds: Rales Suctioning: Airway Yes  NIF -25cmH20 VC .650L  Pt following commands and had cuff leak prior to extubation.  Pt extubated to 50% venturi mask per MD.  ETT and oral suction performed prior to extubation.  Pt vocalizing well and no stridor noted.  Rhonchi noted bilaterally post-extubation.  Incentive spirometry and pillow cough introduced.  Theo Dills 11/30/2013, 1:46 AM

## 2013-11-30 NOTE — Progress Notes (Signed)
[facility] BRIEF SICU PROGRESS NOTE  1 Day Post-Op  S/P Procedure(s) (LRB): CORONARY ARTERY BYPASS GRAFTING (CABG) x 4 using endoscopically harvested right saphenous vein and left internal mammary artery (N/A) INTRAOPERATIVE TRANSESOPHAGEAL ECHOCARDIOGRAM (N/A)   Overall stable day Tolerated HD w/ 1800 mL volume removed Sinus tach w/ stable BP Oxygenation remains marginal  Plan: Continue current plan  OWEN,CLARENCE H 11/30/2013 7:41 PM

## 2013-11-30 NOTE — Progress Notes (Signed)
Utilization Review Completed.  

## 2013-11-30 NOTE — Progress Notes (Addendum)
TCTS DAILY ICU PROGRESS NOTE                   [address]            [place] [address]          [phone]   1 Day Post-Op Procedure(s) (LRB): CORONARY ARTERY BYPASS GRAFTING (CABG) x 4 using endoscopically harvested right saphenous vein and left internal mammary artery (N/A) INTRAOPERATIVE TRANSESOPHAGEAL ECHOCARDIOGRAM (N/A)  Total Length of Stay:  LOS: 1 day   Subjective: Feeling ok, some soreness in neck  Objective: Vital signs in last 24 hours: Temp:  [96.3 F (35.7 C)-100.6 F (38.1 C)] 98.1 F (36.7 C) (03/20 0830) Pulse Rate:  [74-102] 95 (03/20 0830) Cardiac Rhythm:  [-] Normal sinus rhythm (03/20 0800) Resp:  [12-28] 18 (03/20 0830) BP: (77-132)/(49-96) 111/78 mmHg (03/20 0800) SpO2:  [90 %-100 %] 93 % (03/20 0830) FiO2 (%):  [40 %-100 %] 100 % (03/20 0800) Weight:  [158 lb 8.2 oz (71.9 kg)-158 lb 11.7 oz (72 kg)] 158 lb 8.2 oz (71.9 kg) (03/20 0500)  Filed Weights   11/28/13 1238 11/29/13 1300 11/30/13 0500  Weight: 158 lb 8 oz (71.895 kg) 158 lb 11.7 oz (72 kg) 158 lb 8.2 oz (71.9 kg)    Weight change: 3.7 oz (0.105 kg)   Hemodynamic parameters for last 24 hours: PAP: (19-38)/(8-23) 29/15 mmHg CO:  [4.2 L/min-6.5 L/min] 6.5 L/min CI:  [2.3 L/min/m2-3.6 L/min/m2] 3.6 L/min/m2  Intake/Output from previous day: 03/19 0701 - 03/20 0700 In: 1943.3 [I.V.:1053.3; Blood:220; NG/GT:70; IV Piggyback:600] Out: 1237 [Urine:377; Blood:400; Chest Tube:460]  Intake/Output this shift: Total I/O In: 80 [I.V.:30; IV Piggyback:50] Out: 55 [Urine:25; Chest Tube:30]  Current Meds: Scheduled Meds: . acetaminophen  1,000 mg Oral 4 times per day   Or  . acetaminophen (TYLENOL) oral liquid 160 mg/5 mL  1,000 mg Per Tube 4 times per day  . aspirin EC  325 mg Oral Daily   Or  . aspirin  324 mg Per Tube Daily  . atorvastatin  20 mg Oral Daily  . bisacodyl  10 mg Oral Daily   Or  . bisacodyl  10 mg Rectal Daily  . cefUROXime (ZINACEF)  IV  1.5 g  Intravenous Q12H  . darbepoetin (ARANESP) injection - DIALYSIS  100 mcg Intravenous Q Fri-HD  . docusate sodium  200 mg Oral Daily  . insulin aspart  0-24 Units Subcutaneous 6 times per day  . magnesium sulfate  4 g Intravenous Once  . metoCLOPramide (REGLAN) injection  10 mg Intravenous 4 times per day  . metoprolol tartrate  12.5 mg Oral BID   Or  . metoprolol tartrate  12.5 mg Per Tube BID  . [START ON 12/01/2013] pantoprazole  40 mg Oral Daily  . sodium chloride  3 mL Intravenous Q12H   Continuous Infusions: . sodium chloride 10 mL/hr at 11/30/13 0800  . sodium chloride 10 mL/hr at 11/30/13 0400  . sodium chloride    . sodium chloride 20 mL/hr at 11/30/13 0800  . dexmedetomidine Stopped (11/29/13 1630)  . lactated ringers    . nitroGLYCERIN Stopped (11/29/13 1245)  . phenylephrine (NEO-SYNEPHRINE) Adult infusion Stopped (11/30/13 0600)   PRN Meds:.albumin human, metoprolol, midazolam, morphine injection, ondansetron (ZOFRAN) IV, oxyCODONE, sodium chloride  General appearance: alert, cooperative and no distress Heart: regular rate and rhythm Lungs: coarse BS Abdomen: soft, nontender Extremities: minor edema Wound: dressings CDI  Lab Results: CBC: Recent Labs  11/29/13  1830 11/29/13 1835 11/30/13 0500  WBC 13.6*  --  20.2*  HGB 9.9* 10.5* 9.2*  HCT 30.3* 31.0* 28.4*  PLT 279  --  303   BMET:  Recent Labs  11/30/13 0500 11/30/13 0730  NA 135* 135*  K 5.2 5.1  CL 97 97  CO2 20 22  GLUCOSE 126* 89  BUN 27* 29*  CREATININE 4.81* 4.90*  CALCIUM 8.5 8.5    PT/INR:  Recent Labs  11/29/13 1245  LABPROT 16.0*  INR 1.31   ABG    Component Value Date/Time   PHART 7.334* 11/30/2013 0444   PCO2ART 42.1 11/30/2013 0444   PO2ART 66.0* 11/30/2013 0444   HCO3 22.4 11/30/2013 0444   TCO2 24 11/30/2013 0444   ACIDBASEDEF 3.0* 11/30/2013 0444   O2SAT 91.0 11/30/2013 0444     Radiology: Dg Chest Portable 1 View In Am  11/30/2013   CLINICAL DATA:  Post CABG.  EXAM:  PORTABLE CHEST - 1 VIEW  COMPARISON:  DG CHEST 1V PORT dated 11/29/2013  FINDINGS: The Swan-Ganz catheter is probably coiled in the distal main pulmonary artery or proximal right pulmonary artery and similar to the prior examination. Mediastinal and left chest tube are still present. No evidence for a pneumothorax. There is a right jugular dialysis catheter with the tip at the superior cavoatrial junction. Persistent hazy densities along the medial right lung base have minimally changed. The endotracheal tube and nasogastric have been removed and slightly decreased lung volumes.  IMPRESSION: Removal of endotracheal tube and nasogastric tube. Slightly decreased lung volumes.  Persistent hazy densities at the medial right lung base that could be related to atelectasis.  Stable position of the Swan-Ganz catheter as described.  Stable position of chest tubes without a pneumothorax.   Electronically Signed   By: Markus Daft M.D.   On: 11/30/2013 08:01   Dg Chest Portable 1 View  11/29/2013   CLINICAL DATA:  Postop CABG  EXAM: PORTABLE CHEST - 1 VIEW  COMPARISON:  DG CHEST 2 VIEW dated 11/27/2013  FINDINGS: The lungs are adequately inflated. There is atelectasis in the right lower lobe. The left lung is clear. The cardiac silhouette is normal in size. There are 8 intact sternal wires present. The endotracheal tube tip lies approximately 3.2 cm above the crotch of the carina. The esophagogastric tube proximal port lies at the level of the GE junction and advancement by 10 cm is recommended. There is a left internal jugular Cordis sheath through which a Swan-Ganz catheter has been placed. The tip of the catheter lies in the proximal right main pulmonary artery. A mediastinal drain is present with its tip at the level of the fifth ribs. There is a left chest tube whose tip lies over the posterior medial aspect of the left fifth rib. There is a dual lumen dialysis type catheter in place on the right which appears unchanged  with its tip in the region of the junction of the SVC with the right atrium.  IMPRESSION: 1. There is increased density in the inferior medial aspect of the right lower lobe consistent with atelectasis. The left lung is clear. 2. The cardiac silhouette and pulmonary vascularity within the limits of normal with no evidence of pulmonary edema. 3. There is no pleural effusion or pneumothorax. 4. The esophagogastric tube needs advancement by at least 10 cm to assure that the proximal port remains below the GE junction. The endotracheal tube tip lies 3.2 cm above the crotch of the carina.  Electronically Signed   By: David  Martinique   On: 11/29/2013 13:26     Assessment/Plan: S/P Procedure(s) (LRB): CORONARY ARTERY BYPASS GRAFTING (CABG) x 4 using endoscopically harvested right saphenous vein and left internal mammary artery (N/A) INTRAOPERATIVE TRANSESOPHAGEAL ECHOCARDIOGRAM (N/A)  1 making good overall progress 2 for dialysis this am, made 377 cc of urine yesterday 3 on no inotropes 4 requiring NRB mask , will add xopenex, may need inhaled steroid or anticholinergic 5 moderate CT drainage- 460 yesterday. Will leave for now till after dialysis 6 expected ABL anemia- monitor 7 sugars adeq controlled GOLD,WAYNE E 11/30/2013 8:32 AM  Chart reviewed, patient examined, agree with above. His CXR looks a little wet but overall good aeration. His oxygenation should improve with HD. He is wheezing now and is a heavy smoker. Baseline room air sats preop were 95%.  Plan HD today then remove all chest tubes and lines, foley.

## 2013-11-30 NOTE — Progress Notes (Signed)
Patient ID: Daryl Weeks, male   DOB: February 25, 1961, 53 y.o.   MRN: [id number]  [facility] Progress Note   Assessment/ Plan:   1. CAD s/p 4 vessel CABG: off pressors and doing well post-op. No acute events noted from overnight and volume status/electrolytes not critical  2.ESRD: plan for HD today (non-urgent) 3. Anemia: Hgb lower- to get ESA today and will check iron studies 4. CKD-MBD: Resume VDRA and binders as medications become known 5. Nutrition: Monitor albumin post-op 6. Hypertension: BP well controlled post-op, monitor  Subjective:   Reports to have expected chest pain post CABG   Objective:   BP 111/78  Pulse 95  Temp(Src) 98.1 F (36.7 C) (Core (Comment))  Resp 18  Ht 5\' 7"  (1.702 m)  Wt 71.9 kg (158 lb 8.2 oz)  BMI 24.82 kg/m2  SpO2 93%  Physical Exam: [id number] resting in bed [id number] RRR, normal S1 and S2 Resp:CTA bilaterally, no rales/rhonchi [id number], flat, NT, BS normal Ext:No LE edema  Labs: BMET  Recent Labs Lab 11/27/13 1309  11/29/13 0940 11/29/13 1033 11/29/13 1133 11/29/13 1246 11/29/13 1830 11/29/13 1835 11/30/13 0500 11/30/13 0730  NA 135*  < > 138 135* 135* 135*  --  133* 135* 135*  K 3.9  < > 4.1 4.5 4.6 4.4  --  4.8 5.2 5.1  CL 97  --   --   --   --   --   --  100 97 97  CO2 21  --   --   --   --   --   --   --  20 22  GLUCOSE 94  < > 95 84 96 86  --  92 126* 89  BUN 20  --   --   --   --   --   --  21 27* 29*  CREATININE 4.35*  --   --   --   --   --  4.19* 4.50* 4.81* 4.90*  CALCIUM 8.4  --   --   --   --   --   --   --  8.5 8.5  PHOS  --   --   --   --   --   --   --   --  6.5*  --   < > = values in this interval not displayed. CBC  Recent Labs Lab 11/27/13 1309  11/29/13 1030  11/29/13 1245 11/29/13 1246 11/29/13 1830 11/29/13 1835 11/30/13 0500  WBC 8.4  --   --   --  11.2*  --  13.6*  --  20.2*  HGB 11.6*  < > 7.6*  < > 8.9* 9.5* 9.9* 10.5* 9.2*  HCT 34.5*  < > 22.7*  < > 26.5* 28.0* 30.3* 31.0*  28.4*  MCV 96.6  --   --   --  97.8  --  99.7  --  100.4*  PLT 354  --  224  --  218  --  279  --  303  < > = values in this interval not displayed.  Medications:    . acetaminophen  1,000 mg Oral 4 times per day   Or  . acetaminophen (TYLENOL) oral liquid 160 mg/5 mL  1,000 mg Per Tube 4 times per day  . aspirin EC  325 mg Oral Daily   Or  . aspirin  324 mg Per Tube Daily  . atorvastatin  20 mg Oral Daily  . bisacodyl  10  mg Oral Daily   Or  . bisacodyl  10 mg Rectal Daily  . cefUROXime (ZINACEF)  IV  1.5 g Intravenous Q12H  . darbepoetin (ARANESP) injection - DIALYSIS  100 mcg Intravenous Q Fri-HD  . docusate sodium  200 mg Oral Daily  . insulin aspart  0-24 Units Subcutaneous 6 times per day  . levalbuterol  0.63 mg Nebulization TID  . magnesium sulfate  4 g Intravenous Once  . metoCLOPramide (REGLAN) injection  10 mg Intravenous 4 times per day  . metoprolol tartrate  12.5 mg Oral BID   Or  . metoprolol tartrate  12.5 mg Per Tube BID  . [START ON 12/01/2013] pantoprazole  40 mg Oral Daily  . sodium chloride  3 mL Intravenous Q12H   Elmarie Shiley, MD 11/30/2013, 8:56 AM

## 2013-12-01 ENCOUNTER — Inpatient Hospital Stay (HOSPITAL_COMMUNITY): Payer: PRIVATE HEALTH INSURANCE

## 2013-12-01 LAB — IRON AND TIBC
Iron: 10 ug/dL — ABNORMAL LOW (ref 42–135)
SATURATION RATIOS: 7 % — AB (ref 20–55)
TIBC: 143 ug/dL — AB (ref 215–435)
UIBC: 133 ug/dL (ref 125–400)

## 2013-12-01 LAB — TYPE AND SCREEN
ABO/RH(D): A POS
Antibody Screen: NEGATIVE
UNIT DIVISION: 0
Unit division: 0
Unit division: 0
Unit division: 0

## 2013-12-01 LAB — CBC
HEMATOCRIT: 23.1 % — AB (ref 39.0–52.0)
HEMOGLOBIN: 7.7 g/dL — AB (ref 13.0–17.0)
MCH: 33.2 pg (ref 26.0–34.0)
MCHC: 33.3 g/dL (ref 30.0–36.0)
MCV: 99.6 fL (ref 78.0–100.0)
Platelets: 243 10*3/uL (ref 150–400)
RBC: 2.32 MIL/uL — AB (ref 4.22–5.81)
RDW: 15.2 % (ref 11.5–15.5)
WBC: 18.7 10*3/uL — AB (ref 4.0–10.5)

## 2013-12-01 LAB — BASIC METABOLIC PANEL
BUN: 18 mg/dL (ref 6–23)
CHLORIDE: 103 meq/L (ref 96–112)
CO2: 28 mEq/L (ref 19–32)
Calcium: 8.3 mg/dL — ABNORMAL LOW (ref 8.4–10.5)
Creatinine, Ser: 3.48 mg/dL — ABNORMAL HIGH (ref 0.50–1.35)
GFR calc non Af Amer: 19 mL/min — ABNORMAL LOW (ref >90)
GFR, EST AFRICAN AMERICAN: 22 mL/min — AB (ref >90)
Glucose, Bld: 108 mg/dL — ABNORMAL HIGH (ref 70–99)
Potassium: 4.3 mEq/L (ref 3.7–5.3)
SODIUM: 142 meq/L (ref 137–147)

## 2013-12-01 LAB — PREPARE RBC (CROSSMATCH)

## 2013-12-01 LAB — GLUCOSE, CAPILLARY
GLUCOSE-CAPILLARY: 103 mg/dL — AB (ref 70–99)
Glucose-Capillary: 97 mg/dL (ref 70–99)

## 2013-12-01 LAB — BLOOD PRODUCT ORDER (VERBAL) VERIFICATION

## 2013-12-01 LAB — FERRITIN: Ferritin: 263 ng/mL (ref 22–322)

## 2013-12-01 MED ORDER — MUPIROCIN 2 % EX OINT
1.0000 "application " | TOPICAL_OINTMENT | Freq: Two times a day (BID) | CUTANEOUS | Status: AC
  Administered 2013-12-01 – 2013-12-05 (×8): 1 via NASAL

## 2013-12-01 MED ORDER — METOPROLOL TARTRATE 25 MG PO TABS
25.0000 mg | ORAL_TABLET | Freq: Two times a day (BID) | ORAL | Status: DC
  Administered 2013-12-01: 25 mg via ORAL
  Filled 2013-12-01 (×3): qty 1

## 2013-12-01 MED ORDER — CHLORHEXIDINE GLUCONATE CLOTH 2 % EX PADS
6.0000 | MEDICATED_PAD | Freq: Every day | CUTANEOUS | Status: AC
  Administered 2013-12-02 – 2013-12-06 (×5): 6 via TOPICAL

## 2013-12-01 MED ORDER — SODIUM CHLORIDE 0.9 % IJ SOLN
3.0000 mL | Freq: Two times a day (BID) | INTRAMUSCULAR | Status: DC
  Administered 2013-12-01: 3 mL via INTRAVENOUS
  Administered 2013-12-02: 12:00:00 via INTRAVENOUS
  Administered 2013-12-02: 3 mL via INTRAVENOUS

## 2013-12-01 MED ORDER — MOVING RIGHT ALONG BOOK
Freq: Once | Status: AC
  Administered 2013-12-01: 11:00:00
  Filled 2013-12-01: qty 1

## 2013-12-01 MED ORDER — TRAMADOL HCL 50 MG PO TABS: 50.0000 mg | ORAL_TABLET | Freq: Four times a day (QID) | ORAL | Status: DC | PRN

## 2013-12-01 MED ORDER — AMIODARONE HCL IN DEXTROSE 360-4.14 MG/200ML-% IV SOLN
30.0000 mg/h | INTRAVENOUS | Status: AC
  Administered 2013-12-02 (×2): 30 mg/h via INTRAVENOUS
  Filled 2013-12-01 (×4): qty 200

## 2013-12-01 MED ORDER — SODIUM CHLORIDE 0.9 % IJ SOLN: 3.0000 mL | INTRAMUSCULAR | Status: DC | PRN

## 2013-12-01 MED ORDER — AMIODARONE HCL IN DEXTROSE 360-4.14 MG/200ML-% IV SOLN
60.0000 mg/h | INTRAVENOUS | Status: AC
  Administered 2013-12-01 (×2): 60 mg/h via INTRAVENOUS
  Filled 2013-12-01 (×2): qty 200

## 2013-12-01 MED ORDER — SODIUM CHLORIDE 0.9 % IV SOLN: 250.0000 mL | INTRAVENOUS | Status: DC | PRN

## 2013-12-01 MED ORDER — AMIODARONE LOAD VIA INFUSION
150.0000 mg | Freq: Once | INTRAVENOUS | Status: AC
  Administered 2013-12-01: 150 mg via INTRAVENOUS
  Filled 2013-12-01: qty 83.34

## 2013-12-01 MED ORDER — GUAIFENESIN ER 600 MG PO TB12
600.0000 mg | ORAL_TABLET | Freq: Two times a day (BID) | ORAL | Status: DC | PRN
  Administered 2013-12-02: 600 mg via ORAL
  Filled 2013-12-01 (×2): qty 1

## 2013-12-01 NOTE — Progress Notes (Signed)
TCTS BRIEF SICU PROGRESS NOTE  2 Days Post-Op  S/P Procedure(s) (LRB): CORONARY ARTERY BYPASS GRAFTING (CABG) x 4 using endoscopically harvested right saphenous vein and left internal mammary artery (N/A) INTRAOPERATIVE TRANSESOPHAGEAL ECHOCARDIOGRAM (N/A)   Overall stable day Episode rapid Afib/Aflutter earlier, maintaining NSR on amiodarone since Just finished HD which he tolerated well, 3 liters off and received 2 units PRBCs Still w/ increased O2 requirements but stable  Plan: Continue current plan  OWEN,CLARENCE H 12/01/2013 6:42 PM

## 2013-12-01 NOTE — Progress Notes (Addendum)
Patient ID: Daryl Weeks, male   DOB: [birth year], 53 y.o.   MRN: [id number]   [facility] Progress Note   Assessment/ Plan:   1. CAD s/p 4 vessel CABG: recovering well per TCTS note 2.ESRD: plan for HD Monday--will likely need PRBCs at HD with current H/H and recent CABG (non-urgent)  3. Anemia: Hgb lower-check iron studies  4. CKD-MBD: Resume VDRA and binders as medications become known  5. Nutrition: Monitor albumin post-op  6. Hypertension: BP well controlled post-op, monitor trends on metoprolol  Subjective:   No acute events noted overnight- dialyzed without problems yesterday.   Objective:   BP 128/72  Pulse 105  Temp(Src) 98.9 F (37.2 C) (Oral)  Resp 14  Ht 5\' 7"  (1.702 m)  Wt 70.8 kg (156 lb 1.4 oz)  BMI 24.44 kg/m2  SpO2 90%  Physical Exam: [id number] sitting up in recliner with oxygen via NRB [id number] regular tachycardia, normal s1 and s2 Resp:Decreased BS over bases- occasional coarse rales [id number], obese, NT, BS normal Ext:No LE edema  Labs: BMET  Recent Labs Lab 11/27/13 1309  11/29/13 1033 11/29/13 1133 11/29/13 1246 11/29/13 1830 11/29/13 1835 11/30/13 0500 11/30/13 0730 11/30/13 1500 12/01/13 0513  NA 135*  < > 135* 135* 135*  --  133* 135* 135*  --  142  K 3.9  < > 4.5 4.6 4.4  --  4.8 5.2 5.1 3.4* 4.3  CL 97  --   --   --   --   --  100 97 97  --  103  CO2 21  --   --   --   --   --   --  20 22  --  28  GLUCOSE 94  < > 84 96 86  --  92 126* 89  --  108*  BUN 20  --   --   --   --   --  21 27* 29*  --  18  CREATININE 4.35*  --   --   --   --  4.19* 4.50* 4.81* 4.90* 1.61* 3.48*  CALCIUM 8.4  --   --   --   --   --   --  8.5 8.5  --  8.3*  PHOS  --   --   --   --   --   --   --  6.5*  --   --   --   < > = values in this interval not displayed. CBC  Recent Labs Lab 11/29/13 1830 11/29/13 1835 11/30/13 0500 11/30/13 1500 12/01/13 0513  WBC 13.6*  --  20.2* 19.6* 18.7*  HGB 9.9* 10.5* 9.2* 8.4* 7.7*  HCT 30.3*  31.0* 28.4* 25.6* 23.1*  MCV 99.7  --  100.4* 98.8 99.6  PLT 279  --  303 238 243   Medications:    . acetaminophen  1,000 mg Oral 4 times per day   Or  . acetaminophen (TYLENOL) oral liquid 160 mg/5 mL  1,000 mg Per Tube 4 times per day  . aspirin EC  325 mg Oral Daily   Or  . aspirin  324 mg Per Tube Daily  . atorvastatin  20 mg Oral Daily  . bisacodyl  10 mg Oral Daily   Or  . bisacodyl  10 mg Rectal Daily  . cefUROXime (ZINACEF)  IV  1.5 g Intravenous Q12H  . darbepoetin (ARANESP) injection - DIALYSIS  100 mcg Intravenous Q Fri-HD  . docusate sodium  200 mg Oral Daily  . insulin aspart  0-24 Units Subcutaneous 6 times per day  . levalbuterol  0.63 mg Nebulization TID  . magnesium sulfate  4 g Intravenous Once  . metoprolol tartrate  12.5 mg Oral BID   Or  . metoprolol tartrate  12.5 mg Per Tube BID  . pantoprazole  40 mg Oral Daily  . sodium chloride  3 mL Intravenous Q12H   Elmarie Shiley, MD 12/01/2013, 7:57 AM

## 2013-12-01 NOTE — Progress Notes (Signed)
      [address]       [place] [address]             [phone]        CARDIOTHORACIC SURGERY PROGRESS NOTE   R2 Days Post-Op Procedure(s) (LRB): CORONARY ARTERY BYPASS GRAFTING (CABG) x 4 using endoscopically harvested right saphenous vein and left internal mammary artery (N/A) INTRAOPERATIVE TRANSESOPHAGEAL ECHOCARDIOGRAM (N/A)  Subjective: Looks okay and feels okay.  Mild soreness in chest.  Objective: Vital signs: BP Readings from Last 1 Encounters:  12/01/13 118/84   Pulse Readings from Last 1 Encounters:  12/01/13 102   Resp Readings from Last 1 Encounters:  12/01/13 15   Temp Readings from Last 1 Encounters:  12/01/13 97.7 F (36.5 C) Oral    Hemodynamics: PAP: (22-33)/(6-21) 22/6 mmHg  Physical Exam:  Rhythm:   Sinus tach  Breath sounds: Fairly clear  Heart sounds:  RRR  Incisions:  Dressing dry, intact  Abdomen:  Soft, non-distended, non-tender  Extremities:  Warm, well-perfused    Intake/Output from previous day: 03/20 0701 - 03/21 0700 In: 1020 [P.O.:360; I.V.:560; IV Piggyback:100] Out: 2019 [Urine:209; Chest Tube:60] Intake/Output this shift: Total I/O In: 90 [I.V.:40; IV Piggyback:50] Out: 10 [Urine:10]  Lab Results:  CBC: Recent Labs  11/30/13 1500 12/01/13 0513  WBC 19.6* 18.7*  HGB 8.4* 7.7*  HCT 25.6* 23.1*  PLT 238 243    BMET:  Recent Labs  11/30/13 0730 11/30/13 1500 12/01/13 0513  NA 135*  --  142  K 5.1 3.4* 4.3  CL 97  --  103  CO2 22  --  28  GLUCOSE 89  --  108*  BUN 29*  --  18  CREATININE 4.90* 1.61* 3.48*  CALCIUM 8.5  --  8.3*     CBG (last 3)   Recent Labs  11/30/13 2324 12/01/13 0406 12/01/13 0824  GLUCAP 114* 103* 97    ABG    Component Value Date/Time   PHART 7.470* 11/30/2013 1529   PCO2ART 41.3 11/30/2013 1529   PO2ART 51.0* 11/30/2013 1529   HCO3 30.1* 11/30/2013 1529   TCO2 31 11/30/2013 1529   ACIDBASEDEF 3.0* 11/30/2013 0444   O2SAT 88.0 11/30/2013 1529     CXR: Not done  Assessment/Plan: S/P Procedure(s) (LRB): CORONARY ARTERY BYPASS GRAFTING (CABG) x 4 using endoscopically harvested right saphenous vein and left internal mammary artery (N/A) INTRAOPERATIVE TRANSESOPHAGEAL ECHOCARDIOGRAM (N/A)  Overall stable POD2 Increased O2 requirement, desaturates rapidly when taken off of NRB facemask Maintaining sinus rhythm w/ stable BP Expected post op acute blood loss anemia, worse - might benefit from transfusion during HD Expected post op volume excess, weight reportedly below baseline - ? Dry weight ESRD on dialysis COPD Tobacco and EtOH abuse   Mobilize  Pulm toilet  Continue Xopenex nebs  Check CXR  May need HD sooner than Monday for volume removal, depending upon progress  Watch Hgb and consider transfusion during HD  Keep in SICU until O2 requirement decreased   Daryl Weeks,Daryl Weeks 12/01/2013 10:23 AM

## 2013-12-01 NOTE — Progress Notes (Signed)
  Amiodarone Drug - Drug Interaction Consult Note  Recommendations:  Amiodarone is metabolized by the cytochrome P450 system and therefore has the potential to cause many drug interactions. Amiodarone has an average plasma half-life of 50 days (range 20 to 100 days).   There is potential for drug interactions to occur several weeks or months after stopping treatment and the onset of drug interactions may be slow after initiating amiodarone.   [x]  Statins: Increased risk of myopathy. Simvastatin- restrict dose to 20mg  daily. Other statins: counsel patients to report any muscle pain or weakness immediately.  Pt is on Lipitor, consistent with our substitution policy for patients also receiving Amidoranone.  []  Anticoagulants: Amiodarone can increase anticoagulant effect. Consider warfarin dose reduction. Patients should be monitored closely and the dose of anticoagulant altered accordingly, remembering that amiodarone levels take several weeks to stabilize.  []  Antiepileptics: Amiodarone can increase plasma concentration of phenytoin, the dose should be reduced. Note that small changes in phenytoin dose can result in large changes in levels. Monitor patient and counsel on signs of toxicity.  [x]  Beta blockers: increased risk of bradycardia, AV block and myocardial depression. Sotalol - avoid concomitant use.  []   Calcium channel blockers (diltiazem and verapamil): increased risk of bradycardia, AV block and myocardial depression.  []   Cyclosporine: Amiodarone increases levels of cyclosporine. Reduced dose of cyclosporine is recommended.  []  Digoxin dose should be halved when amiodarone is started.  []  Diuretics: increased risk of cardiotoxicity if hypokalemia occurs.  []  Oral hypoglycemic agents (glyburide, glipizide, glimepiride): increased risk of hypoglycemia. Patient's glucose levels should be monitored closely when initiating amiodarone therapy.   []  Drugs that prolong the QT interval:   Torsades de pointes risk may be increased with concurrent use - avoid if possible.  Monitor QTc, also keep magnesium/potassium WNL if concurrent therapy can't be avoided. [name] Antibiotics: e.g. fluoroquinolones, erythromycin. . Antiarrhythmics: e.g. quinidine, procainamide, disopyramide, sotalol. . Antipsychotics: e.g. phenothiazines, haloperidol.  . Lithium, tricyclic antidepressants, and methadone.   Thank You,  Jaquita Folds  12/01/2013 1:26 PM

## 2013-12-01 NOTE — Progress Notes (Signed)
Pt HR 150-160 afib/aflutter. Back to sinus rhythm with vagal maneuver, MD Roxy Manns notified, orders for [facility] given. BP 101/68 at time of arrythmia. Will cont to monitor. Traci Plemons L

## 2013-12-02 ENCOUNTER — Inpatient Hospital Stay (HOSPITAL_COMMUNITY): Payer: PRIVATE HEALTH INSURANCE

## 2013-12-02 LAB — TYPE AND SCREEN
ABO/RH(D): A POS
Antibody Screen: NEGATIVE
UNIT DIVISION: 0
Unit division: 0

## 2013-12-02 LAB — CBC
HCT: 28 % — ABNORMAL LOW (ref 39.0–52.0)
HEMOGLOBIN: 9.5 g/dL — AB (ref 13.0–17.0)
MCH: 31.1 pg (ref 26.0–34.0)
MCHC: 33.9 g/dL (ref 30.0–36.0)
MCV: 91.8 fL (ref 78.0–100.0)
Platelets: 216 10*3/uL (ref 150–400)
RBC: 3.05 MIL/uL — AB (ref 4.22–5.81)
RDW: 18.7 % — ABNORMAL HIGH (ref 11.5–15.5)
WBC: 18.9 10*3/uL — ABNORMAL HIGH (ref 4.0–10.5)

## 2013-12-02 LAB — BASIC METABOLIC PANEL
BUN: 14 mg/dL (ref 6–23)
CO2: 29 mEq/L (ref 19–32)
Calcium: 8.4 mg/dL (ref 8.4–10.5)
Chloride: 98 mEq/L (ref 96–112)
Creatinine, Ser: 2.87 mg/dL — ABNORMAL HIGH (ref 0.50–1.35)
GFR calc Af Amer: 28 mL/min — ABNORMAL LOW (ref >90)
GFR calc non Af Amer: 24 mL/min — ABNORMAL LOW (ref >90)
Glucose, Bld: 112 mg/dL — ABNORMAL HIGH (ref 70–99)
Potassium: 3.9 mEq/L (ref 3.7–5.3)
Sodium: 138 mEq/L (ref 137–147)

## 2013-12-02 MED ORDER — HYDRALAZINE HCL 50 MG PO TABS
50.0000 mg | ORAL_TABLET | Freq: Three times a day (TID) | ORAL | Status: DC
  Administered 2013-12-02 – 2013-12-07 (×13): 50 mg via ORAL
  Filled 2013-12-02 (×18): qty 1

## 2013-12-02 MED ORDER — SODIUM CHLORIDE 0.9 % IV SOLN
250.0000 mg | INTRAVENOUS | Status: DC
  Administered 2013-12-03 – 2013-12-07 (×3): 250 mg via INTRAVENOUS
  Filled 2013-12-02 (×7): qty 20

## 2013-12-02 MED ORDER — AMIODARONE HCL 200 MG PO TABS
400.0000 mg | ORAL_TABLET | Freq: Two times a day (BID) | ORAL | Status: DC
Start: 2013-12-02 — End: 2013-12-07
  Administered 2013-12-02 – 2013-12-07 (×11): 400 mg via ORAL
  Filled 2013-12-02 (×13): qty 2

## 2013-12-02 MED ORDER — AMLODIPINE BESYLATE 10 MG PO TABS
10.0000 mg | ORAL_TABLET | Freq: Every day | ORAL | Status: DC
  Administered 2013-12-02 – 2013-12-03 (×2): 10 mg via ORAL
  Filled 2013-12-02 (×3): qty 1

## 2013-12-02 MED ORDER — METOPROLOL TARTRATE 50 MG PO TABS
50.0000 mg | ORAL_TABLET | Freq: Two times a day (BID) | ORAL | Status: DC
  Administered 2013-12-02 (×2): 50 mg via ORAL
  Filled 2013-12-02 (×4): qty 1

## 2013-12-02 NOTE — Progress Notes (Signed)
Dr Roxy Manns called r/t Pt on Amiodarone drip that measured a QTc interval of 0.52 msec; orders received to remain on drip; will cont' to monitor and assess.

## 2013-12-02 NOTE — Progress Notes (Signed)
Patient ID: Daryl Weeks, male   DOB: [birth year], 53 y.o.   MRN: [id number]   [facility] Progress Note   Assessment/ Plan:   1. CAD s/p 4 vessel CABG: recovering well per TCTS note  2.ESRD: Dialyzed yesterday to facilitate fluid removal and give PRBCs. Routine HD tomorrow. 3. Anemia: Hgb better post-transfusion of 2 units. Iron studies show low Tsat- will give IV Fe  4. CKD-MBD: Will start binder with meals TIDAC. Not on VDRA   5. Nutrition: Monitor albumin post-op  6. Hypertension: BP well controlled post-op, monitor trends on metoprolol  Subjective:   Reports to be feeling better after dialysis done yesterday   Objective:   BP 158/88  Pulse 89  Temp(Src) 97.5 F (36.4 C) (Oral)  Resp 8  Ht 5\' 7"  (1.702 m)  Wt 69.2 kg (152 lb 8.9 oz)  BMI 23.89 kg/m2  SpO2 91%  Physical Exam: [id number] sitting up in recliner eating breakfast [id number] RRR, normal S1 and S2 Resp: Decreased bibasal BS, no rales [id number], flat, NT, BS normal [id number] LE edema  Labs: BMET  Recent Labs Lab 11/27/13 1309  11/29/13 1133 11/29/13 1246 11/29/13 1830 11/29/13 1835 11/30/13 0500 11/30/13 0730 11/30/13 1500 12/01/13 0513 12/02/13 0340  NA 135*  < > 135* 135*  --  133* 135* 135*  --  142 138  K 3.9  < > 4.6 4.4  --  4.8 5.2 5.1 3.4* 4.3 3.9  CL 97  --   --   --   --  100 97 97  --  103 98  CO2 21  --   --   --   --   --  20 22  --  28 29  GLUCOSE 94  < > 96 86  --  92 126* 89  --  108* 112*  BUN 20  --   --   --   --  21 27* 29*  --  18 14  CREATININE 4.35*  --   --   --  4.19* 4.50* 4.81* 4.90* 1.61* 3.48* 2.87*  CALCIUM 8.4  --   --   --   --   --  8.5 8.5  --  8.3* 8.4  PHOS  --   --   --   --   --   --  6.5*  --   --   --   --   < > = values in this interval not displayed. CBC  Recent Labs Lab 11/30/13 0500 11/30/13 1500 12/01/13 0513 12/02/13 0340  WBC 20.2* 19.6* 18.7* 18.9*  HGB 9.2* 8.4* 7.7* 9.5*  HCT 28.4* 25.6* 23.1* 28.0*  MCV 100.4* 98.8 99.6  91.8  PLT 303 238 243 216   Medications:    . acetaminophen  1,000 mg Oral 4 times per day   Or  . acetaminophen (TYLENOL) oral liquid 160 mg/5 mL  1,000 mg Per Tube 4 times per day  . aspirin EC  325 mg Oral Daily  . atorvastatin  20 mg Oral Daily  . bisacodyl  10 mg Oral Daily   Or  . bisacodyl  10 mg Rectal Daily  . Chlorhexidine Gluconate Cloth  6 each Topical Daily  . darbepoetin (ARANESP) injection - DIALYSIS  100 mcg Intravenous Q Fri-HD  . docusate sodium  200 mg Oral Daily  . levalbuterol  0.63 mg Nebulization TID  . magnesium sulfate  4 g Intravenous Once  . metoprolol tartrate  25 mg Oral BID  .  mupirocin ointment  1 application Nasal BID  . pantoprazole  40 mg Oral Daily  . sodium chloride  3 mL Intravenous Q12H   Elmarie Shiley, MD 12/02/2013, 8:52 AM

## 2013-12-02 NOTE — Progress Notes (Signed)
[address]       [place] [address]             [phone]        CARDIOTHORACIC SURGERY PROGRESS NOTE   R3 Days Post-Op Procedure(s) (LRB): CORONARY ARTERY BYPASS GRAFTING (CABG) x 4 using endoscopically harvested right saphenous vein and left internal mammary artery (N/A) INTRAOPERATIVE TRANSESOPHAGEAL ECHOCARDIOGRAM (N/A)  Subjective: Looks better.  Feels better.  Maintaining O2 sats >92% on 5 L/min via [place] off facemask  Objective: Vital signs: BP Readings from Last 1 Encounters:  12/02/13 158/88   Pulse Readings from Last 1 Encounters:  12/02/13 89   Resp Readings from Last 1 Encounters:  12/02/13 8   Temp Readings from Last 1 Encounters:  12/02/13 97.5 F (36.4 C) Oral    Hemodynamics:    Physical Exam:  Rhythm:   sinus  Breath sounds: Coarse rhonchi  Heart sounds:  RRR  Incisions:  Clean and dry  Abdomen:  Soft, non-distended, non-tender  Extremities:  Warm, well perfused   Intake/Output from previous day: 03/21 0701 - 03/22 0700 In: 2584.7 [P.O.:1020; I.V.:900.7; Blood:614; IV Piggyback:50] Out: [id number] [Urine:110] Intake/Output this shift: Total I/O In: 21.7 [I.V.:21.7] Out: -   Lab Results:  CBC: Recent Labs  12/01/13 0513 12/02/13 0340  WBC 18.7* 18.9*  HGB 7.7* 9.5*  HCT 23.1* 28.0*  PLT 243 216    BMET:  Recent Labs  12/01/13 0513 12/02/13 0340  NA 142 138  K 4.3 3.9  CL 103 98  CO2 28 29  GLUCOSE 108* 112*  BUN 18 14  CREATININE 3.48* 2.87*  CALCIUM 8.3* 8.4     CBG (last 3)   Recent Labs  11/30/13 2324 12/01/13 0406 12/01/13 0824  GLUCAP 114* 103* 97    ABG    Component Value Date/Time   PHART 7.470* 11/30/2013 1529   PCO2ART 41.3 11/30/2013 1529   PO2ART 51.0* 11/30/2013 1529   HCO3 30.1* 11/30/2013 1529   TCO2 31 11/30/2013 1529   ACIDBASEDEF 3.0* 11/30/2013 0444   O2SAT 88.0 11/30/2013 1529    CXR: CHEST 2 VIEW  COMPARISON: December 01, 2013.  FINDINGS:  Stable cardiomediastinal  silhouette. Right internal jugular dialysis  catheter is unchanged in position. Left internal jugular venous  sheath remains. Sternotomy wires are noted. Mild bilateral pleural  effusions are noted and unchanged. Stable bilateral lung opacities  are noted most consistent with pneumonia or possibly atelectasis.  Stable right upper lobe opacity is noted concerning for pneumonia.  IMPRESSION:  Stable mild bilateral pleural effusions. Stable bilateral lung  opacities are noted consistent with pneumonia or atelectasis.  Electronically Signed  By: Sabino Dick M.D.  On: 12/02/2013 08:51   Assessment/Plan: S/P Procedure(s) (LRB): CORONARY ARTERY BYPASS GRAFTING (CABG) x 4 using endoscopically harvested right saphenous vein and left internal mammary artery (N/A) INTRAOPERATIVE TRANSESOPHAGEAL ECHOCARDIOGRAM (N/A)  Overall stable POD3  Increased O2 requirement, although finally maintaining sats off of NRB facemask  Post-op rapid Afib, currently maintaining sinus rhythm on IV amiodarone Hypertension Expected post op acute blood loss anemia, improved following transfusion during HD  Expected post op volume excess, weight decreased 1.6 kg last 24 hrs ESRD on dialysis  COPD  Tobacco and EtOH abuse   Mobilize  Pulm toilet  Continue Xopenex nebs  HD again tomorrow per Nephrology Convert amiodarone to oral Increase metoprolol Restart Norvasc and Hydralazine Keep in SICU until O2 requirement decreased   Aijalon Demuro H 12/02/2013 9:59 AM

## 2013-12-03 ENCOUNTER — Inpatient Hospital Stay (HOSPITAL_COMMUNITY): Payer: PRIVATE HEALTH INSURANCE

## 2013-12-03 LAB — POCT I-STAT, CHEM 8
BUN: 5 mg/dL — ABNORMAL LOW (ref 6–23)
Calcium, Ion: 1.12 mmol/L (ref 1.12–1.23)
Chloride: 101 mEq/L (ref 96–112)
Creatinine, Ser: 1.7 mg/dL — ABNORMAL HIGH (ref 0.50–1.35)
Glucose, Bld: 106 mg/dL — ABNORMAL HIGH (ref 70–99)
HEMATOCRIT: 28 % — AB (ref 39.0–52.0)
HEMOGLOBIN: 9.5 g/dL — AB (ref 13.0–17.0)
POTASSIUM: 3.2 meq/L — AB (ref 3.7–5.3)
Sodium: 143 mEq/L (ref 137–147)
TCO2: 28 mmol/L (ref 0–100)

## 2013-12-03 LAB — CBC
HEMATOCRIT: 27.2 % — AB (ref 39.0–52.0)
Hemoglobin: 9.3 g/dL — ABNORMAL LOW (ref 13.0–17.0)
MCH: 30.8 pg (ref 26.0–34.0)
MCHC: 34.2 g/dL (ref 30.0–36.0)
MCV: 90.1 fL (ref 78.0–100.0)
Platelets: 247 10*3/uL (ref 150–400)
RBC: 3.02 MIL/uL — ABNORMAL LOW (ref 4.22–5.81)
RDW: 18.1 % — ABNORMAL HIGH (ref 11.5–15.5)
WBC: 14.6 10*3/uL — ABNORMAL HIGH (ref 4.0–10.5)

## 2013-12-03 LAB — BASIC METABOLIC PANEL
BUN: 31 mg/dL — ABNORMAL HIGH (ref 6–23)
CHLORIDE: 95 meq/L — AB (ref 96–112)
CO2: 24 meq/L (ref 19–32)
CREATININE: 4.22 mg/dL — AB (ref 0.50–1.35)
Calcium: 8.2 mg/dL — ABNORMAL LOW (ref 8.4–10.5)
GFR calc Af Amer: 17 mL/min — ABNORMAL LOW (ref >90)
GFR calc non Af Amer: 15 mL/min — ABNORMAL LOW (ref >90)
Glucose, Bld: 98 mg/dL (ref 70–99)
Potassium: 3.5 mEq/L — ABNORMAL LOW (ref 3.7–5.3)
Sodium: 134 mEq/L — ABNORMAL LOW (ref 137–147)

## 2013-12-03 MED ORDER — ONDANSETRON HCL 4 MG/2ML IJ SOLN: 4.0000 mg | Freq: Four times a day (QID) | INTRAMUSCULAR | Status: DC | PRN

## 2013-12-03 MED ORDER — OXYCODONE HCL 5 MG PO TABS
5.0000 mg | ORAL_TABLET | ORAL | Status: DC | PRN
  Administered 2013-12-03 – 2013-12-06 (×6): 10 mg via ORAL
  Filled 2013-12-03 (×6): qty 2

## 2013-12-03 MED ORDER — MOVING RIGHT ALONG BOOK
Freq: Once | Status: DC
  Filled 2013-12-03: qty 1

## 2013-12-03 MED ORDER — BISACODYL 5 MG PO TBEC
10.0000 mg | DELAYED_RELEASE_TABLET | Freq: Every day | ORAL | Status: DC | PRN
  Administered 2013-12-05 – 2013-12-06 (×2): 10 mg via ORAL
  Filled 2013-12-03 (×2): qty 2

## 2013-12-03 MED ORDER — ACETAMINOPHEN 325 MG PO TABS
650.0000 mg | ORAL_TABLET | Freq: Four times a day (QID) | ORAL | Status: DC | PRN
  Administered 2013-12-07: 650 mg via ORAL
  Filled 2013-12-03: qty 2

## 2013-12-03 MED ORDER — ONDANSETRON HCL 4 MG PO TABS: 4.0000 mg | ORAL_TABLET | Freq: Four times a day (QID) | ORAL | Status: DC | PRN

## 2013-12-03 MED ORDER — SODIUM CHLORIDE 0.9 % IJ SOLN
3.0000 mL | Freq: Two times a day (BID) | INTRAMUSCULAR | Status: DC
  Administered 2013-12-03 – 2013-12-06 (×7): 3 mL via INTRAVENOUS

## 2013-12-03 MED ORDER — METOPROLOL TARTRATE 50 MG PO TABS
50.0000 mg | ORAL_TABLET | Freq: Two times a day (BID) | ORAL | Status: DC
  Administered 2013-12-03 – 2013-12-07 (×8): 50 mg via ORAL
  Filled 2013-12-03 (×9): qty 1

## 2013-12-03 MED ORDER — PANTOPRAZOLE SODIUM 40 MG PO TBEC
40.0000 mg | DELAYED_RELEASE_TABLET | Freq: Every day | ORAL | Status: DC
  Administered 2013-12-04 – 2013-12-07 (×4): 40 mg via ORAL
  Filled 2013-12-03 (×4): qty 1

## 2013-12-03 MED ORDER — SODIUM CHLORIDE 0.9 % IJ SOLN
3.0000 mL | INTRAMUSCULAR | Status: DC | PRN
  Administered 2013-12-06: 3 mL via INTRAVENOUS

## 2013-12-03 MED ORDER — SODIUM CHLORIDE 0.9 % IV SOLN: 250.0000 mL | INTRAVENOUS | Status: DC | PRN

## 2013-12-03 MED ORDER — AMOXICILLIN-POT CLAVULANATE 500-125 MG PO TABS
1.0000 | ORAL_TABLET | ORAL | Status: DC
  Administered 2013-12-03 – 2013-12-07 (×5): 500 mg via ORAL
  Filled 2013-12-03 (×6): qty 1

## 2013-12-03 MED ORDER — BISACODYL 10 MG RE SUPP: 10.0000 mg | Freq: Every day | RECTAL | Status: DC | PRN

## 2013-12-03 MED ORDER — ASPIRIN EC 325 MG PO TBEC
325.0000 mg | DELAYED_RELEASE_TABLET | Freq: Every day | ORAL | Status: DC
  Administered 2013-12-04 – 2013-12-07 (×4): 325 mg via ORAL
  Filled 2013-12-03 (×4): qty 1

## 2013-12-03 MED ORDER — DOCUSATE SODIUM 100 MG PO CAPS
200.0000 mg | ORAL_CAPSULE | Freq: Every day | ORAL | Status: DC
  Administered 2013-12-04 – 2013-12-06 (×3): 200 mg via ORAL
  Filled 2013-12-03 (×4): qty 2

## 2013-12-03 NOTE — Progress Notes (Signed)
Patient ID: Daryl Weeks, male   DOB: 1961-06-29, 53 y.o.   MRN: [id number]   [facility] Progress Note   Assessment/ Plan:   1. CAD s/p 4 vessel CABG: recovering well per TCTS note  2.ESRD: MWF via [facility]- Dialyzed Sat to facilitate fluid removal and give PRBCs. Routine HD today via PC (were holding on access due to heart issues) would like to have him done in the HD unit if OK with [facility]  3. Anemia: Hgb better post-transfusion of 2 units. Iron studies show low Tsat- will give IV Fe - also aranesp 100 4. CKD-MBD: not on binder in house as of yet.   Not on VDRA  - will monitor for need.  Will do 4K bath today since was having arrhythmias 5. Nutrition: Monitor albumin post-op  6. Hypertension: BP well controlled post-op, monitor trends on metoprolol- OP meds restarted (norvasc and hydralazine)- may limit volume removal- will monitor and change reg as needed  Subjective:   No new complaints- O2 req down   Objective:   BP 141/81  Pulse 79  Temp(Src) 98.2 F (36.8 C) (Oral)  Resp 18  Ht 5\' 7"  (1.702 m)  Wt 71.1 kg (156 lb 12 oz)  BMI 24.54 kg/m2  SpO2 94%  Physical Exam: [id number] sitting up in recliner eating breakfast [id number] RRR, normal S1 and S2 Resp: Decreased bibasal BS, no rales [id number], flat, NT, BS normal [id number] LE edema  Labs: BMET  Recent Labs Lab 11/27/13 1309  11/29/13 1246  11/29/13 1835 11/30/13 0500 11/30/13 0730 11/30/13 1500 12/01/13 0513 12/02/13 0340 12/03/13 0229  NA 135*  < > 135*  --  133* 135* 135*  --  142 138 134*  K 3.9  < > 4.4  --  4.8 5.2 5.1 3.4* 4.3 3.9 3.5*  CL 97  --   --   --  100 97 97  --  103 98 95*  CO2 21  --   --   --   --  20 22  --  28 29 24   GLUCOSE 94  < > 86  --  92 126* 89  --  108* 112* 98  BUN 20  --   --   --  21 27* 29*  --  18 14 31*  CREATININE 4.35*  --   --   < > 4.50* 4.81* 4.90* 1.61* 3.48* 2.87* 4.22*  CALCIUM 8.4  --   --   --   --  8.5 8.5  --  8.3* 8.4 8.2*  PHOS  --   --    --   --   --  6.5*  --   --   --   --   --   < > = values in this interval not displayed. CBC  Recent Labs Lab 11/30/13 1500 12/01/13 0513 12/02/13 0340 12/03/13 0229  WBC 19.6* 18.7* 18.9* 14.6*  HGB 8.4* 7.7* 9.5* 9.3*  HCT 25.6* 23.1* 28.0* 27.2*  MCV 98.8 99.6 91.8 90.1  PLT 238 243 216 247   Medications:    . acetaminophen  1,000 mg Oral 4 times per day   Or  . acetaminophen (TYLENOL) oral liquid 160 mg/5 mL  1,000 mg Per Tube 4 times per day  . amiodarone  400 mg Oral BID PC  . amLODipine  10 mg Oral Daily  . aspirin EC  325 mg Oral Daily  . atorvastatin  20 mg Oral Daily  . bisacodyl  10  mg Oral Daily   Or  . bisacodyl  10 mg Rectal Daily  . Chlorhexidine Gluconate Cloth  6 each Topical Daily  . darbepoetin (ARANESP) injection - DIALYSIS  100 mcg Intravenous Q Fri-HD  . docusate sodium  200 mg Oral Daily  . ferric gluconate (FERRLECIT/NULECIT) IV  250 mg Intravenous Q M,W,F-HD  . hydrALAZINE  50 mg Oral 3 times per day  . levalbuterol  0.63 mg Nebulization TID  . magnesium sulfate  4 g Intravenous Once  . metoprolol tartrate  50 mg Oral BID  . mupirocin ointment  1 application Nasal BID  . pantoprazole  40 mg Oral Daily  [name] A   12/03/2013, 7:18 AM

## 2013-12-03 NOTE — Progress Notes (Signed)
4 Days Post-Op Procedure(s) (LRB): CORONARY ARTERY BYPASS GRAFTING (CABG) x 4 using endoscopically harvested right saphenous vein and left internal mammary artery (N/A) INTRAOPERATIVE TRANSESOPHAGEAL ECHOCARDIOGRAM (N/A) Subjective:  No complaints.  Objective: Vital signs in last 24 hours: Temp:  [97.5 F (36.4 C)-98.7 F (37.1 C)] 98.2 F (36.8 C) (03/23 0000) Pulse Rate:  [66-98] 79 (03/23 0600) Cardiac Rhythm:  [-] Normal sinus rhythm (03/22 2000) Resp:  [8-24] 18 (03/23 0600) BP: (106-158)/(55-94) 141/81 mmHg (03/23 0600) SpO2:  [87 %-98 %] 94 % (03/23 0717) FiO2 (%):  [50 %] 50 % (03/22 1200) Weight:  [71.1 kg (156 lb 12 oz)] 71.1 kg (156 lb 12 oz) (03/23 0600)  Hemodynamic parameters for last 24 hours:    Intake/Output from previous day: 03/22 0701 - 03/23 0700 In: 1897 [P.O.:1680; I.V.:217] Out: 100 [Urine:100] Intake/Output this shift:    General appearance: alert and cooperative Neurologic: intact Heart: regular rate and rhythm, S1, S2 normal, no murmur, click, rub or gallop Lungs: rhonchi bilaterally Extremities: extremities normal, atraumatic, no cyanosis or edema Wound: incisions ok  Lab Results:  Recent Labs  12/02/13 0340 12/03/13 0229  WBC 18.9* 14.6*  HGB 9.5* 9.3*  HCT 28.0* 27.2*  PLT 216 247   BMET:  Recent Labs  12/02/13 0340 12/03/13 0229  NA 138 134*  K 3.9 3.5*  CL 98 95*  CO2 29 24  GLUCOSE 112* 98  BUN 14 31*  CREATININE 2.87* 4.22*  CALCIUM 8.4 8.2*    PT/INR: No results found for this basename: LABPROT, INR,  in the last 72 hours ABG    Component Value Date/Time   PHART 7.470* 11/30/2013 1529   HCO3 30.1* 11/30/2013 1529   TCO2 31 11/30/2013 1529   ACIDBASEDEF 3.0* 11/30/2013 0444   O2SAT 88.0 11/30/2013 1529   CBG (last 3)   Recent Labs  11/30/13 2324 12/01/13 0406 12/01/13 0824  GLUCAP 114* 103* 97   CLINICAL DATA: Congestive heart failure.  EXAM:  CHEST 2 VIEW  COMPARISON: December 02, 2013.  FINDINGS:   Status post coronary artery bypass graft. Right internal jugular  dialysis catheter is unchanged in position. Left internal jugular  venous sheath has been removed. Stable bilateral lung opacities are  noted consistent with pneumonia or atelectasis. No pneumothorax is  noted. Mild left pleural effusion is noted which may be slightly  increased in size. Stable right minimal pleural effusion.  IMPRESSION:  Stable bilateral lung opacities concerning for pneumonia or  atelectasis. Mild bilateral pleural effusions are noted.  Electronically Signed  By: Sabino Dick M.D.  On: 12/03/2013 08:05   Assessment/Plan: S/P Procedure(s) (LRB): CORONARY ARTERY BYPASS GRAFTING (CABG) x 4 using endoscopically harvested right saphenous vein and left internal mammary artery (N/A) INTRAOPERATIVE TRANSESOPHAGEAL ECHOCARDIOGRAM (N/A) Hemodynamics stable Respiratory status continues to improve. Sats 96% on 3L [place]. CXR still shows patchy airspace disease that could be some pneumonia or atelectasis. Continue IS and flutter valve. Will start oral antibiotic. Plan HD today. Mobilize Plan for transfer to step-down: see transfer orders   LOS: 4 days    BARTLE,BRYAN K 12/03/2013

## 2013-12-03 NOTE — Procedures (Signed)
Patient was seen on dialysis and the procedure was supervised.  BFR 400  Via PC BP is  157/97.   Patient appears to be tolerating treatment well  Drago Hammonds A 12/03/2013

## 2013-12-04 ENCOUNTER — Inpatient Hospital Stay (HOSPITAL_COMMUNITY): Payer: PRIVATE HEALTH INSURANCE

## 2013-12-04 LAB — CBC
HCT: 28.9 % — ABNORMAL LOW (ref 39.0–52.0)
HEMOGLOBIN: 9.7 g/dL — AB (ref 13.0–17.0)
MCH: 31.3 pg (ref 26.0–34.0)
MCHC: 33.6 g/dL (ref 30.0–36.0)
MCV: 93.2 fL (ref 78.0–100.0)
Platelets: 253 10*3/uL (ref 150–400)
RBC: 3.1 MIL/uL — AB (ref 4.22–5.81)
RDW: 17.8 % — ABNORMAL HIGH (ref 11.5–15.5)
WBC: 10.3 10*3/uL (ref 4.0–10.5)

## 2013-12-04 LAB — BASIC METABOLIC PANEL
BUN: 20 mg/dL (ref 6–23)
CALCIUM: 8.3 mg/dL — AB (ref 8.4–10.5)
CO2: 28 meq/L (ref 19–32)
Chloride: 96 mEq/L (ref 96–112)
Creatinine, Ser: 3.3 mg/dL — ABNORMAL HIGH (ref 0.50–1.35)
GFR calc Af Amer: 24 mL/min — ABNORMAL LOW (ref >90)
GFR calc non Af Amer: 20 mL/min — ABNORMAL LOW (ref >90)
GLUCOSE: 104 mg/dL — AB (ref 70–99)
POTASSIUM: 3.7 meq/L (ref 3.7–5.3)
SODIUM: 136 meq/L — AB (ref 137–147)

## 2013-12-04 MED ORDER — LEVALBUTEROL HCL 0.63 MG/3ML IN NEBU: 0.6300 mg | INHALATION_SOLUTION | Freq: Four times a day (QID) | RESPIRATORY_TRACT | Status: DC | PRN

## 2013-12-04 MED ORDER — AMLODIPINE BESYLATE 5 MG PO TABS
5.0000 mg | ORAL_TABLET | Freq: Every day | ORAL | Status: DC
  Administered 2013-12-04 – 2013-12-07 (×4): 5 mg via ORAL
  Filled 2013-12-04 (×4): qty 1

## 2013-12-04 NOTE — Progress Notes (Signed)
CARDIAC REHAB PHASE I   PRE:  Rate/Rhythm: 77 SR  BP:  Supine:   Sitting: 116/70  Standing:    SaO2: 89 RA  MODE:  Ambulation: 550 ft   POST:  Rate/Rhythm: 102  BP:  Supine:   Sitting: 142/70  Standing:    SaO2: 87 RA in hall 91 2L 1135-1225 On arrival pt on O2 2L, he took O2 off sat on room air 89%. Assisted X 1 and used walker to ambulate. Gait steady with walker. Pt able to walk 550 feet without c/o. Room air sat in hall 87%, placed pt on O2 L sat improved to 94%. Walked rest of walk on 2L sat on return to room 91%. Pt to recliner after walk with call light in reach. Encouraged use of IS and flutter value and two more walks today.   Rodney Langton RN 12/04/2013 12:23 PM

## 2013-12-04 NOTE — Evaluation (Signed)
Physical Therapy Evaluation Patient Details Name: Daryl Weeks MRN: [id number] DOB: [birth year] Today's Date: 12/04/2013   History of Present Illness  S/P tricuspid valve repair and CABG  Clinical Impression  Pt admitted with heart attack s/p CABG.  Pt currently limited functionally due to the problems listed. ( See problems list.)   Pt will benefit from PT to maximize function and safety in order to get ready for next venue listed below.     Follow Up Recommendations SNF    Equipment Recommendations  None recommended by PT    Recommendations for Other Services       Precautions / Restrictions Precautions Precautions: Sternal;Fall      Mobility  Bed Mobility Overal bed mobility: Needs Assistance Bed Mobility: Supine to Sit     Supine to sit: Supervision     General bed mobility comments: reinforced sternal precautions.  pt used UE lightly to get up  Transfers Overall transfer level: Needs assistance   Transfers: Sit to/from Stand Sit to Stand: Min guard         General transfer comment: reinforced not using UE's due to sternal prec;.  Ambulation/Gait Ambulation/Gait assistance: Min guard Ambulation Distance (Feet): 400 Feet Assistive device: None Gait Pattern/deviations: Step-through pattern Gait velocity: slow, but able to incr. speed on cue Gait velocity interpretation: Below normal speed for age/gender General Gait Details: generally steady, but mild unsteadiness at times  Stairs            Wheelchair Mobility    Modified Rankin (Stroke Patients Only)       Balance Overall balance assessment: Needs assistance Sitting-balance support: Feet supported;No upper extremity supported Sitting balance-Leahy Scale: Good     Standing balance support: During functional activity;No upper extremity supported Standing balance-Leahy Scale: Fair               High level balance activites: Backward walking;Direction changes;Turns;Head turns High  Level Balance Comments: no overt LOB, but guarded     Pertinent Vitals/Pain At rest sats at 92%  HR 80's,  During gait  sats at 89%  HR low 90's.  With efficient breathing sats quickly increased to 92%    Home Living Family/patient expects to be discharged to:: Skilled nursing facility Living Arrangements: Alone   Type of Home: House Home Access: Stairs to enter Entrance Stairs-Rails: Right;Left Entrance Stairs-Number of Steps: 4 Home Layout: Two level;Able to live on main level with bedroom/bathroom Home Equipment: None      Prior Function Level of Independence: Independent               Hand Dominance        Extremity/Trunk Assessment   Upper Extremity Assessment: Overall WFL for tasks assessed           Lower Extremity Assessment: Overall WFL for tasks assessed (general weakness only from inactivity)         Communication   Communication: No difficulties  Cognition Arousal/Alertness: Awake/alert Behavior During Therapy: WFL for tasks assessed/performed Overall Cognitive Status: Within Functional Limits for tasks assessed                      General Comments      Exercises        Assessment/Plan    PT Assessment Patient needs continued PT services  PT Diagnosis Other (comment);Generalized weakness (decr activity tolerance)   PT Problem List Decreased activity tolerance;Decreased mobility;Decreased knowledge of precautions  PT Treatment Interventions Gait training;Stair training;Functional mobility training;Therapeutic  activities;Patient/family education   PT Goals (Current goals can be found in the Care Plan section) Acute Rehab PT Goals Patient Stated Goal: get back to work PT Goal Formulation: With patient Time For Goal Achievement: 12/11/13 Potential to Achieve Goals: Good    Frequency Min 3X/week   Barriers to discharge Decreased caregiver support      End of Session   Activity Tolerance: Patient tolerated treatment  well Patient left: Other (comment) (sitting EOB)         Time: [id number] PT Time Calculation (min): 22 min   Charges:   PT Evaluation $Initial PT Evaluation Tier I: 1 Procedure PT Treatments $Gait Training: 8-22 mins   PT G Codes:          Azarah Dacy, Tessie Fass 12/04/2013, 4:09 PM 12/04/2013  Donnella Sham, PT [phone]  (pager)

## 2013-12-04 NOTE — Care Management Note (Unsigned)
    Page 1 of 2   12/06/2013     4:14:27 PM   CARE MANAGEMENT NOTE 12/06/2013  Patient:  Daryl Weeks,Daryl Weeks   Account Number:  [id number]  Date Initiated:  12/03/2013  Documentation initiated by:  MAYO,HENRIETTA  Subjective/Objective Assessment:   dx CAD S/P CABG x 4     Action/Plan:   PT LIVES AT HOME ALONE; SON WORKS.  P.T. RECOMMENDING SNF AT DC; CSW FOLLOWING TO FACILITATE DC TO SNF WHEN MEDICALLY STABLE FOR DC.   Anticipated DC Date:  12/07/2013   Anticipated DC Plan:  [place] referral  Clinical Social Worker      DC Planning Services  CM consult      Choice offered to / List presented to:             Status of service:  In process, will continue to follow Medicare Important Message given?   (If response is "NO", the following Medicare IM given date fields will be blank) Date Medicare IM given:   Date Additional Medicare IM given:    Discharge Disposition:    Per UR Regulation:  Reviewed for med. necessity/level of care/duration of stay  If discussed at [place] of Stay Meetings, dates discussed:   12/04/2013  12/06/2013    Comments:  12/06/13 Daryl Bello,RN,BSN [id number] PT/FAMILY UNABLE TO PRIVATE PAY FOR SNF.  SPOKE WITH PT'S SON, Daryl Weeks(CELL [id number]):  HE STATES PT CAN DC HOME WITH PT AND HIS WIFE.  HE IS AGREEABLE TO HH FOR HIS DAD, AND MAY HIRE CNA TO STAY WITH DAD WHILE THEY WORK.  SON TO VISIT THIS EVENING.  WILL LEAVE Daryl Weeks CO HH LIST AND PRIVATE DUTY SITTER LIST FOR REFERENCE.  CALLED SON'S WIFE, Daryl Weeks [phone]), AT Daryl Weeks'S REQUEST AND UPDATED HER ON EVENTS.  PT TO DIALYZE PRIOR TO DC HOME TOMORROW.  WILL NEED ORDERS FOR HHRN, HHPT.  PA UPDATE ON DC PLANNING ISSUES AND NEED FOR HH ORDERS.  12/06/13 Daryl Surowiec,RN,BSN [id number] NOTIFIED BY CSW THAT PT'S INSURANCE DOES NOT HAVE SNF BENEFIT.  PT WILL NOT BE ABLE TO GO TO SNF UNLESS HE PRIVATE PAYS, WHICH IS UNLIKELY.  CSW TO FOLLOW UP WITH PT/SON TODAY TO DISCUSS OPTIONS.   PT MAY NEED TO DC HOME WITH SON AND HH SERVICES TO FOLLOW UP AT HOME, SHOULD SNF NOT BE AN OPTION.

## 2013-12-04 NOTE — Progress Notes (Signed)
Clinical Social Work Department BRIEF PSYCHOSOCIAL ASSESSMENT 12/04/2013  Patient:  Daryl Weeks, Daryl Weeks     Account Number:  [id number]     Admit date:  11/29/2013  Clinical Social Worker:  Megan Salon  Date/Time:  12/04/2013 02:29 PM  Referred by:  Physician  Date Referred:  12/04/2013 Referred for  SNF Placement   Other Referral:   Interview type:  Other - See comment Other interview type:   CSW spoke to patient and patient's son over the phone    PSYCHOSOCIAL DATA Living Status:  ALONE Admitted from facility:   Level of care:   Primary support name:  [place] Primary support relationship to patient:  CHILD, ADULT Degree of support available:   Supportive    CURRENT CONCERNS Current Concerns  Post-Acute Placement   Other Concerns:    SOCIAL WORK ASSESSMENT / PLAN Clinical Social Worker received referral for SNF placement at d/c. CSW introduced self and explained reason for visit. CSW explained SNF process to patient. Patient then called his son so we could all speak about snf.. Patient's son states him and his wife work and hates that he cant provide the supervision for his dad, but is unable to due to his job. Patient reported he is agreeable for SNF placement in [facility].  CSW will complete FL2 for MD's signature and will update patient and family when bed offers are received.   Assessment/plan status:  Psychosocial Support/Ongoing Assessment of Needs Other assessment/ plan:   Information/referral to community resources:   SNF information/CSW contact information    PATIENT'S/FAMILY'S RESPONSE TO PLAN OF CARE: Patient states he is agreeable for snf placement for supervision and understands that his son works and is unable to provide care.       Jeanette Caprice, MSW, [place]

## 2013-12-04 NOTE — Progress Notes (Signed)
Pt amb 500 ft pushing walker.  Pt did not have any complaints. Will continue to monitor pt closely.

## 2013-12-04 NOTE — Progress Notes (Signed)
Patient ID: Daryl Weeks, male   DOB: Feb 04, 1961, 53 y.o.   MRN: [id number]   [facility] Progress Note   Assessment/ Plan:   1. CAD s/p 4 vessel CABG: recovering well per TCTS note  2.ESRD: MWF via [facility]- Dialyzed Sat to facilitate fluid removal and give PRBCs. Routine HD tomorrow via PC (were holding on access due to heart issues) 3. Anemia: Hgb better post-transfusion of 2 units. Iron studies show low Tsat- giving IV Fe - also aranesp 100 4. CKD-MBD: not on binder in house as of yet.   Not on VDRA  - will monitor for need.  Will do 4K bath today since was having arrhythmias 5. Nutrition: Monitor albumin post-op  6. Hypertension: BP well controlled post-op, monitor trends on metoprolol- OP meds restarted (norvasc and hydralazine)- may limit volume removal- will monitor and change reg as needed  Subjective:   HD yest, removed 2900 with better BP this AM and better O2 req as well.  Looks good this AM   Objective:   BP 127/78  Pulse 78  Temp(Src) 98.6 F (37 C) (Oral)  Resp 18  Ht 5\' 7"  (1.702 m)  Wt 67.9 kg (149 lb 11.1 oz)  BMI 23.44 kg/m2  SpO2 94%  Physical Exam: [id number] sitting up in recliner eating breakfast [id number] RRR, normal S1 and S2 Resp: Decreased bibasal BS, no rales [id number], flat, NT, BS normal CT:861112 LE edema  Labs: BMET  Recent Labs Lab 11/27/13 1309  11/29/13 1835 11/30/13 0500 11/30/13 0730 11/30/13 1500 11/30/13 1524 12/01/13 0513 12/02/13 0340 12/03/13 0229 12/04/13 0624  NA 135*  < > 133* 135* 135*  --  143 142 138 134* 136*  K 3.9  < > 4.8 5.2 5.1 3.4* 3.2* 4.3 3.9 3.5* 3.7  CL 97  --  100 97 97  --  101 103 98 95* 96  CO2 21  --   --  20 22  --   --  28 29 24 28   GLUCOSE 94  < > 92 126* 89  --  106* 108* 112* 98 104*  BUN 20  --  21 27* 29*  --  5* 18 14 31* 20  CREATININE 4.35*  < > 4.50* 4.81* 4.90* 1.61* 1.70* 3.48* 2.87* 4.22* 3.30*  CALCIUM 8.4  --   --  8.5 8.5  --   --  8.3* 8.4 8.2* 8.3*  PHOS  --    --   --  6.5*  --   --   --   --   --   --   --   < > = values in this interval not displayed. CBC  Recent Labs Lab 12/01/13 0513 12/02/13 0340 12/03/13 0229 12/04/13 0624  WBC 18.7* 18.9* 14.6* 10.3  HGB 7.7* 9.5* 9.3* 9.7*  HCT 23.1* 28.0* 27.2* 28.9*  MCV 99.6 91.8 90.1 93.2  PLT 243 216 247 253   Medications:    . amiodarone  400 mg Oral BID PC  . amLODipine  10 mg Oral Daily  . amoxicillin-clavulanate  1 tablet Oral Q24H  . aspirin EC  325 mg Oral Daily  . atorvastatin  20 mg Oral Daily  . Chlorhexidine Gluconate Cloth  6 each Topical Daily  . darbepoetin (ARANESP) injection - DIALYSIS  100 mcg Intravenous Q Fri-HD  . docusate sodium  200 mg Oral Daily  . ferric gluconate (FERRLECIT/NULECIT) IV  250 mg Intravenous Q M,W,F-HD  . hydrALAZINE  50 mg Oral 3  times per day  . levalbuterol  0.63 mg Nebulization TID  . metoprolol tartrate  50 mg Oral BID  . moving right along book   Does not apply Once  . mupirocin ointment  1 application Nasal BID  . pantoprazole  40 mg Oral QAC breakfast  . sodium chloride  3 mL Intravenous Q12H  Hayk Divis A   12/04/2013, 9:27 AM

## 2013-12-04 NOTE — Progress Notes (Addendum)
Clinical Social Work Department CLINICAL SOCIAL WORK PLACEMENT NOTE 12/04/2013  Patient:  Daryl Weeks, Daryl Weeks  Account Number:  [id number] Admit date:  11/29/2013  Clinical Social Worker:  Megan Salon  Date/time:  12/04/2013 02:56 PM  Clinical Social Work is seeking post-discharge placement for this patient at the following level of care:   [place]   (*CSW will update this form in Epic as items are completed)   12/04/2013  Patient/family provided with [place] Department of Clinical Social Work's list of facilities offering this level of care within the geographic area requested by the patient (or if unable, by the patient's family).  12/04/2013  Patient/family informed of their freedom to choose among providers that offer the needed level of care, that participate in Medicare, Medicaid or managed care program needed by the patient, have an available bed and are willing to accept the patient.  12/04/2013  Patient/family informed of [facility]' ownership interest in [facility], as well as of the fact that they are under no obligation to receive care at this facility.  PASARR submitted to EDS on 12/04/2013 PASARR number received from EDS on 12/04/2013  FL2 transmitted to all facilities in geographic area requested by pt/family on  12/04/2013 FL2 transmitted to all facilities within larger geographic area on   Patient informed that his/her managed care company has contracts with or will negotiate with  certain facilities, including the following:     Patient/family informed of bed offers received:  12/07/2013 Patient chooses bed at [facility] Physician recommends and patient chooses bed at    Patient to be transferred [facility]  on  12/07/2013 Patient to be transferred to facility by Family  The following physician request were entered in Epic:   Additional Comments:  Jeanette Caprice, MSW, [place]

## 2013-12-04 NOTE — Progress Notes (Addendum)
      [address]       [place] [address]             [phone]      5 Days Post-Op Procedure(s) (LRB): CORONARY ARTERY BYPASS GRAFTING (CABG) x 4 using endoscopically harvested right saphenous vein and left internal mammary artery (N/A) INTRAOPERATIVE TRANSESOPHAGEAL ECHOCARDIOGRAM (N/A)  Subjective:  Mr. Partridge has no complaints this morning.  Needs to ambulate. + BM  Objective: Vital signs in last 24 hours: Temp:  [97.7 F (36.5 C)-98.6 F (37 C)] 98.6 F (37 C) (03/24 0446) Pulse Rate:  [77-98] 78 (03/24 0446) Cardiac Rhythm:  [-] Normal sinus rhythm (03/23 2210) Resp:  [15-23] 18 (03/24 0446) BP: (127-162)/(73-97) 127/78 mmHg (03/24 0446) SpO2:  [93 %-100 %] 100 % (03/24 0446) Weight:  [149 lb 11.1 oz (67.9 kg)-157 lb 13.6 oz (71.6 kg)] 149 lb 11.1 oz (67.9 kg) (03/24 0446)  Intake/Output from previous day: 03/23 0701 - 03/24 0700 In: 420 [P.O.:420] Out: 2900   General appearance: alert, cooperative and no distress Heart: regular rate and rhythm Lungs: diminished breath sounds bibasilar Abdomen: soft, non-tender; bowel sounds normal; no masses,  no organomegaly Extremities: edema minimal Wound: clean and dry  Lab Results:  Recent Labs  12/03/13 0229 12/04/13 0624  WBC 14.6* 10.3  HGB 9.3* 9.7*  HCT 27.2* 28.9*  PLT 247 253   BMET:  Recent Labs  12/03/13 0229 12/04/13 0624  NA 134* 136*  K 3.5* 3.7  CL 95* 96  CO2 24 28  GLUCOSE 98 104*  BUN 31* 20  CREATININE 4.22* 3.30*  CALCIUM 8.2* 8.3*    PT/INR: No results found for this basename: LABPROT, INR,  in the last 72 hours ABG    Component Value Date/Time   PHART 7.470* 11/30/2013 1529   HCO3 30.1* 11/30/2013 1529   TCO2 31 11/30/2013 1529   ACIDBASEDEF 3.0* 11/30/2013 0444   O2SAT 88.0 11/30/2013 1529   CBG (last 3)   Recent Labs  12/01/13 0824  GLUCAP 97    Assessment/Plan: S/P Procedure(s) (LRB): CORONARY ARTERY BYPASS GRAFTING (CABG) x 4 using endoscopically  harvested right saphenous vein and left internal mammary artery (N/A) INTRAOPERATIVE TRANSESOPHAGEAL ECHOCARDIOGRAM (N/A)  1. CV- Previous A. Fib, currently NSR- on Amiodarone, Norvasc, Lopressor, Hydralazine 2. Pulm- + right sided infiltrates, improvement in R pleural effusion, increasing atelectasis on left- continue ABX, IS wean oxygen as tolerated 3. Renal- ESRD, nephrology following 4. Deconditioning- patient lives alone, PT consult placed, will need SNF 5. Dispo- patient stable, continue ABX for pulmonary infiltrates, HD per neprhology  LOS: 5 days    Ellwood Handler 12/04/2013   Chart reviewed, patient examined, agree with above. His CXR looks stable. Patchy infiltrates on the right are less dense. He remains afebrile and WBC ct is normal. Continue Augmentin, IS, mobilization.

## 2013-12-05 NOTE — Discharge Summary (Signed)
Physician Discharge Summary  Patient ID: Fannie Monastra MRN: [id number] DOB/AGE: 1961/05/01 53 y.o.  Admit date: 11/29/2013 Discharge date: 12/05/2013  Admission Diagnoses:  Patient Active Problem List   Diagnosis Date Noted  . Chronic diastolic heart failure 123XX123  . Coronary artery disease   . Hyperlipidemia   . Hypertension   . Dyspnea 11/20/2012  . Tobacco abuse    Discharge Diagnoses:   Patient Active Problem List   Diagnosis Date Noted  . S/P CABG x 4 11/29/2013  . Chronic diastolic heart failure 123XX123  . Coronary artery disease   . Hyperlipidemia   . Hypertension   . Dyspnea 11/20/2012  . Tobacco abuse    Discharged Condition: good  History of Present Illness:   Mr. Scimeca is a 53 year old gentleman with a history of hypertension, hyperlipidemia, active smoking and COPD, ETOH abuse, and ESRD due to IgA nephropathy who is on hemodialysis M,W,F since February 2015.  The patient initially presented several days after starting dialysis with complaints of severe dyspnea at rest, severe hypertension with SBP 216/124, hypoxia and pulmonary edema.  He also noted dark stools and was found to be mildly anemic.  He underwent evaluation by GI who performed EGD and Colonoscopy which revealed multiple colon polyps, which were resected and felt to be benign. Troponin level was also elevated with a BNP >100,000.  He also underwent ECHO which showed normal LV function and Mild MR and Moderate TR.  He also underwent cardiac catheterization which showed severe 3 vessel CAD.  It was felt Coronary Bypass Grafting and was referred to TCTS.  He was evaluated by Dr. Cyndia Bent who was in agreement patient would benefit from surgical intervention.  The risks and benefits of the procedure were explained to the patient and he was agreeable to proceed.      Hospital Course:   The patient presented to [facility] on 11/29/2013.  He was taken to the operating room and underwent CABG x 4  utilizing LIMA to LAD, Sequential SVG to OM1 and OM2, and SVG to RCA.  He also underwent Endoscopic Saphenous vein harvest from right leg.  He tolerated the procedure well and was taken to the SICU in stable condition.  The patient was extubated the evening of surgery.  During his stay in the ICU the patient was closely followed by Nephrology and his dialysis regimen was resumed once appropriate.  He required breathing treatments due to his COPD.  His chest tubes and arterial lines were removed without difficulty.  He was having increased oxygen demand which was felt to be due to volume overload.  He developed Atrial Fibrillation.  He was subsequently treated with IV Amiodarone with successful conversion to NSR.  He was then transitioned to an oral regimen.  He was transfused during dialysis for expected acute blood loss anemia.  He developed hypertension and was restarted on home blood pressure medications. He developed infiltrates on his CXR and was placed on Augmentin for empiric coverage.  He was ambulating with minimal difficulty and transferred to the step down unit in stable condition.    The patient has continued to progress.  He remains on oxygen which will be weaned as tolerated.  If unable to wean will arrange home use.  He continues to undergo dialysis and has been closely followed by Nephrology.  He is on his outpatient regimen of MWF.  He is maintaining NSR and his pacing wires were removed without difficulty.  The patient lives alone.  He was evaluated by Physical Therapy who felt he would benefit from SNF placement.  He is ambulating with assistance and tolerating a renal diet.  He is medically stable at this time.  We are arranging SNF placement and will plan to discharge the patient once a bed becomes available.       Consults: nephrology  Treatments: dialysis: Hemodialysis  Surgery:  1. Median Sternotomy 2. Extracorporeal circulation 3. Coronary artery bypass grafting x 4  Left  internal mammary graft to the LAD  Sequential SVG to OM1 and distal LCX  SVG to RCA  Disposition: SNF  Discharge Medications:     Medication List    STOP taking these medications       furosemide 80 MG tablet  Commonly known as:  LASIX     ibuprofen 200 MG tablet  Commonly known as:  ADVIL,MOTRIN     losartan 100 MG tablet  Commonly known as:  COZAAR     nitroGLYCERIN 0.4 MG SL tablet  Commonly known as:  NITROSTAT     spironolactone 25 MG tablet  Commonly known as:  ALDACTONE      TAKE these medications       amiodarone 400 MG tablet  Commonly known as:  PACERONE  Take 1 tablet (400 mg total) by mouth 2 (two) times daily after a meal. For 7 Days, then decrease to 200 mg BID for 7 Days, then decrease to 200 mg daily     amLODipine 5 MG tablet  Commonly known as:  NORVASC  Take 1 tablet (5 mg total) by mouth daily.     amoxicillin-clavulanate 500-125 MG per tablet  Commonly known as:  AUGMENTIN  Take 1 tablet (500 mg total) by mouth daily. For 10 Days  Start taking on:  12/07/2013     aspirin 325 MG tablet  Take 325 mg by mouth daily.     atorvastatin 20 MG tablet  Commonly known as:  LIPITOR  Take 1 tablet (20 mg total) by mouth daily.     hydrALAZINE 50 MG tablet  Commonly known as:  APRESOLINE  Take 1 tablet (50 mg total) by mouth every 8 (eight) hours.     metoprolol 50 MG tablet  Commonly known as:  LOPRESSOR  Take 1 tablet (50 mg total) by mouth 2 (two) times daily.     oxyCODONE 5 MG immediate release tablet  Commonly known as:  Oxy IR/ROXICODONE  Take 1-2 tablets (5-10 mg total) by mouth every 3 (three) hours as needed for severe pain.         The patient has been discharged on:   1.Beta Blocker:  Yes [ x  ]                              No   [   ]                              If No, reason:  2.Ace Inhibitor/ARB: Yes [   ]                                     No  [  x  ]  If No, reason:  ESRD  3.Statin:   Yes [x   ]                  No  [   ]                  If No, reason:  4.Ecasa:  Yes  [ x ]                  No   [   ]                  If No, reason:        Follow-up Information   Follow up with [name], MD.   Specialty:  Cardiothoracic Surgery   Contact information:   [place] [address] [phone]       Follow up with [facility].   Contact information:   [place]       Signed: Loreli Debruler 12/05/2013, 8:30 AM

## 2013-12-05 NOTE — Procedures (Signed)
Patient was seen on dialysis and the procedure was supervised.  BFR 400  Via PC BP is  133/79.   Patient appears to be tolerating treatment well  Daryl Weeks A 12/05/2013

## 2013-12-05 NOTE — Progress Notes (Signed)
EPW pulled per order. Ends intact. Pt informed of bedrest for one hour. Verbalized understanding. VSS. Will continue to monitor pt closely.

## 2013-12-05 NOTE — Discharge Instructions (Signed)
Coronary Artery Bypass Grafting, Care After °Refer to this sheet in the next few weeks. These instructions provide you with information on caring for yourself after your procedure. Your health care provider may also give you more specific instructions. Your treatment has been planned according to current medical practices, but problems sometimes occur. Call your health care provider if you have any problems or questions after your procedure. °WHAT TO EXPECT AFTER THE PROCEDURE °Recovery from surgery will be different for everyone. Some people feel well after 3 or 4 weeks, while for others it takes longer. After your procedure, it is typical to have the following: °· Nausea and a lack of appetite.   °· Constipation. °· Weakness and fatigue.   °· Depression or irritability.   °· Pain or discomfort at your incision site. °HOME CARE INSTRUCTIONS °· Only take over-the-counter or prescription medicines as directed by your health care provider. Take all medicines exactly as directed. Do not stop taking medicines or start any new medicines without first checking with your health care provider.   °· Take your pulse as directed by your health care provider. °· Perform deep breathing as directed by your health care provider. If you were given a device called an incentive spirometer, use it to practice deep breathing several times a day. Support your chest with a pillow or your arms when you take deep breaths or cough. °· Keep incision areas clean, dry, and protected. Remove or change any bandages (dressings) only as directed by your health care provider. You may have skin adhesive strips over the incision areas. Do not take the strips off. They will fall off on their own. °· Check incision areas daily for any swelling, redness, or drainage. °· If incisions were made in your legs, do the following: °· Avoid crossing your legs.   °· Avoid sitting for long periods of time. Change positions every 30 minutes.   °· Elevate your legs  when you are sitting.   °· Wear compression stockings as directed by your health care provider. These stockings help keep blood clots from forming in your legs. °· Take showers once your health care provider approves. Until then, only take sponge baths. Pat incisions dry. Do not rub incisions with a washcloth or towel. Do not take tub baths or go swimming until your health care provider approves. °· Eat foods that are high in fiber, such as raw fruits and vegetables, whole grains, beans, and nuts. Meats should be lean cut. Avoid canned, processed, and fried foods. °· Drink enough fluids to keep your urine clear or pale yellow. °· Weigh yourself every day. This helps identify if you are retaining fluid that may make your heart and lungs work harder.   °· Rest and limit activity as directed by your health care provider. You may be instructed to: °· Stop any activity at once if you have chest pain, shortness of breath, irregular heartbeats, or dizziness. Get help right away if you have any of these symptoms. °· Move around frequently for short periods or take short walks as directed by your health care provider. Increase your activities gradually. You may need physical therapy or cardiac rehabilitation to help strengthen your muscles and build your endurance. °· Avoid lifting, pushing, or pulling anything heavier than 10 lb (4.5 kg) for at least 6 weeks after surgery. °· Do not drive until your health care provider approves.  °· Ask your health care provider when you may return to work and resume sexual activity. °· Follow up with your health care provider as   directed.   °SEEK MEDICAL CARE IF: °· You have swelling, redness, increasing pain, or drainage at the site of an incision.   °· You develop a fever.   °· You have swelling in your ankles or legs.   °· You have pain in your legs.   °· You have weight gain of 2 or more pounds a day. °· You are nauseous or vomit. °· You have diarrhea.  °SEEK IMMEDIATE MEDICAL CARE  IF: °· You have chest pain that goes to your jaw or arms. °· You have shortness of breath.   °· You have a fast or irregular heartbeat.   °· You notice a "clicking" in your breastbone (sternum) when you move.   °· You have numbness or weakness in your arms or legs. °· You feel dizzy or lightheaded.   °MAKE SURE YOU: °· Understand these instructions. °· Will watch your condition. °· Will get help right away if you are not doing well or get worse. °Document Released: 03/19/2005 Document Revised: 05/02/2013 Document Reviewed: 02/06/2013 °ExitCare® Patient Information ©2014 [employer]. ° °Endoscopic Saphenous Vein Harvesting °Care After °Refer to this sheet in the next few weeks. These instructions provide you with information on caring for yourself after your procedure. Your caregiver may also give you more specific instructions. Your treatment has been planned according to current medical practices, but problems sometimes occur. Call your caregiver if you have any problems or questions after your procedure. °HOME CARE INSTRUCTIONS °Medicine °· Take whatever pain medicine your surgeon prescribes. Follow the directions carefully. Do not take over-the-counter pain medicine unless your surgeon says it is okay. Some pain medicine can cause bleeding problems for several weeks after surgery. °· Follow your surgeon's instructions about driving. You will probably not be permitted to drive after heart surgery. °· Take any medicines your surgeon prescribes. Any medicines you took before your heart surgery should be checked with your caregiver before you start taking them again. °Wound care °· Ask your surgeon how long you should keep wearing your elastic bandage or stocking. °· Check the area around your surgical cuts (incisions) whenever your bandages (dressings) are changed. Look for any redness or swelling. °· You will need to return to have the stitches (sutures) or staples taken out. Ask your surgeon when to do  that. °· Ask your surgeon when you can shower or bathe. °Activity °· Try to keep your legs raised when you are sitting. °· Do any exercises your caregivers have given you. These may include deep breathing exercises, coughing, walking, or other exercises. °SEEK MEDICAL CARE IF: °· You have any questions about your medicines. °· You have more leg pain, especially if your pain medicine stops working. °· New or growing bruises develop on your leg. °· Your leg swells, feels tight, or becomes red. °· You have numbness in your leg. °SEEK IMMEDIATE MEDICAL CARE IF: °· Your pain gets much worse. °· Blood or fluid leaks from any of the incisions. °· Your incisions become warm, swollen, or red. °· You have chest pain. °· You have trouble breathing. °· You have a fever. °· You have more pain near your leg incision. °MAKE SURE YOU: °· Understand these instructions. °· Will watch your condition. °· Will get help right away if you are not doing well or get worse. °Document Released: 05/12/2011 Document Revised: 11/22/2011 Document Reviewed: 05/12/2011 °ExitCare® Patient Information ©2014 [employer]. ° ° °

## 2013-12-05 NOTE — Progress Notes (Signed)
Patient ID: Daryl Weeks, male   DOB: October 18, 1960, 53 y.o.   MRN: [id number]   [facility] Progress Note   Assessment/ Plan:   1. CAD s/p 4 vessel CABG: recovering well per TCTS note  2.ESRD: MWF via [facility]- Dialyzed Sat to facilitate fluid removal and give PRBCs. Routine HD today via [facility] (they were holding on access due to heart issues) wherever he goes, they will need to be able to provide transportation to dialysis 3. Anemia: Hgb better post-transfusion of 2 units. Iron studies show low Tsat- giving IV Fe - also aranesp 100 4. CKD-MBD: not on binder in house as of yet.   Not on VDRA  - will monitor for need.  Will do 4K bath today since was having arrhythmias 5. Nutrition: Monitor albumin post-op  6. Hypertension: BP well controlled post-op, monitor trends (on metoprolol- norvasc and hydralazine)- may limit volume removal- will monitor and change reg as needed  Subjective:    Looks good this AM- plan is for placement but dont know the options yet   Objective:   BP 137/77  Pulse 72  Temp(Src) 98.2 F (36.8 C) (Oral)  Resp 18  Ht 5\' 7"  (1.702 m)  Wt 69.9 kg (154 lb 1.6 oz)  BMI 24.13 kg/m2  SpO2 100%  Physical Exam: [id number] sitting up in recliner eating breakfast [id number] RRR, normal S1 and S2 Resp: Decreased bibasal BS, no rales [id number], flat, NT, BS normal [id number] LE edema  Labs: BMET  Recent Labs Lab 11/29/13 1835 11/30/13 0500 11/30/13 0730 11/30/13 1500 11/30/13 1524 12/01/13 0513 12/02/13 0340 12/03/13 0229 12/04/13 0624  NA 133* 135* 135*  --  143 142 138 134* 136*  K 4.8 5.2 5.1 3.4* 3.2* 4.3 3.9 3.5* 3.7  CL 100 97 97  --  101 103 98 95* 96  CO2  --  20 22  --   --  28 29 24 28   GLUCOSE 92 126* 89  --  106* 108* 112* 98 104*  BUN 21 27* 29*  --  5* 18 14 31* 20  CREATININE 4.50* 4.81* 4.90* 1.61* 1.70* 3.48* 2.87* 4.22* 3.30*  CALCIUM  --  8.5 8.5  --   --  8.3* 8.4 8.2* 8.3*  PHOS  --  6.5*  --   --   --   --   --   --    --    CBC  Recent Labs Lab 12/01/13 0513 12/02/13 0340 12/03/13 0229 12/04/13 0624  WBC 18.7* 18.9* 14.6* 10.3  HGB 7.7* 9.5* 9.3* 9.7*  HCT 23.1* 28.0* 27.2* 28.9*  MCV 99.6 91.8 90.1 93.2  PLT 243 216 247 253   Medications:    . amiodarone  400 mg Oral BID PC  . amLODipine  5 mg Oral Daily  . amoxicillin-clavulanate  1 tablet Oral Q24H  . aspirin EC  325 mg Oral Daily  . atorvastatin  20 mg Oral Daily  . Chlorhexidine Gluconate Cloth  6 each Topical Daily  . darbepoetin (ARANESP) injection - DIALYSIS  100 mcg Intravenous Q Fri-HD  . docusate sodium  200 mg Oral Daily  . ferric gluconate (FERRLECIT/NULECIT) IV  250 mg Intravenous Q M,W,F-HD  . hydrALAZINE  50 mg Oral 3 times per day  . metoprolol tartrate  50 mg Oral BID  . moving right along book   Does not apply Once  . mupirocin ointment  1 application Nasal BID  . pantoprazole  40 mg Oral QAC breakfast  .  sodium chloride  3 mL Intravenous Q12H  Pearse Shiffler A   12/05/2013, 9:29 AM

## 2013-12-05 NOTE — Progress Notes (Signed)
CSW provided only bed offer to patient and patient's son over the phone, which was [facility]. Patient and patient's son okay with Geophysicist/field seismologist. CSW following up with facility to see if they get insurance authorization. FL2 on chart for MD signature.  Daryl Weeks, MSW, [place]

## 2013-12-05 NOTE — Progress Notes (Addendum)
      [address]       [place] [address]             [phone]      6 Days Post-Op Procedure(s) (LRB): CORONARY ARTERY BYPASS GRAFTING (CABG) x 4 using endoscopically harvested right saphenous vein and left internal mammary artery (N/A) INTRAOPERATIVE TRANSESOPHAGEAL ECHOCARDIOGRAM (N/A)  Subjective:  Daryl Weeks has no complaints this morning.  He has been evaluated by PT who recommend SNF placement.  He is ambulating with assistance.  + BM  Objective: Vital signs in last 24 hours: Temp:  [98.2 F (36.8 C)-99.6 F (37.6 C)] 98.2 F (36.8 C) (03/25 0412) Pulse Rate:  [72-80] 72 (03/25 0412) Cardiac Rhythm:  [-] Normal sinus rhythm (03/24 1900) Resp:  [18] 18 (03/25 0412) BP: (125-137)/(65-81) 137/77 mmHg (03/25 0412) SpO2:  [92 %-100 %] 100 % (03/25 0412) Weight:  [154 lb 1.6 oz (69.9 kg)] 154 lb 1.6 oz (69.9 kg) (03/25 0412)  General appearance: alert, cooperative and no distress Heart: regular rate and rhythm Lungs: coarse, clears with cough Abdomen: soft, non-tender; bowel sounds normal; no masses,  no organomegaly Extremities: edema trace Wound: clean and dry  Lab Results:  Recent Labs  12/03/13 0229 12/04/13 0624  WBC 14.6* 10.3  HGB 9.3* 9.7*  HCT 27.2* 28.9*  PLT 247 253   BMET:  Recent Labs  12/03/13 0229 12/04/13 0624  NA 134* 136*  K 3.5* 3.7  CL 95* 96  CO2 24 28  GLUCOSE 98 104*  BUN 31* 20  CREATININE 4.22* 3.30*  CALCIUM 8.2* 8.3*    PT/INR: No results found for this basename: LABPROT, INR,  in the last 72 hours ABG    Component Value Date/Time   PHART 7.470* 11/30/2013 1529   HCO3 30.1* 11/30/2013 1529   TCO2 31 11/30/2013 1529   ACIDBASEDEF 3.0* 11/30/2013 0444   O2SAT 88.0 11/30/2013 1529   CBG (last 3)  No results found for this basename: GLUCAP,  in the last 72 hours  Assessment/Plan: S/P Procedure(s) (LRB): CORONARY ARTERY BYPASS GRAFTING (CABG) x 4 using endoscopically harvested right saphenous vein and  left internal mammary artery (N/A) INTRAOPERATIVE TRANSESOPHAGEAL ECHOCARDIOGRAM (N/A)  1. CV- NSR rate and pressure stable- continue Amiodarone, Norvasc, Lopressor, Hydralazine 2. Pulm- + right sided infiltrates, continue Augmentin, wean oxygen as tolerated, continue IS/Flutter Valve 3. Renal- ESRD, for HD today, Nephrology following 4. Deconditioning- patient lives alone, needs SNF 5. Dispo- patient is doing well, will d/c EPW today, possibly d/c to SNF in AM if bed available   LOS: 6 days    Daryl Weeks 12/05/2013   Chart reviewed, patient examined, agree with above. He is doing well overall. Still on some oxygen. Plan HD today. Continue to work on IS, ambulation. He can go to SNF tomorrow if no new problems.

## 2013-12-05 NOTE — Progress Notes (Signed)
1315 Came to see pt to walk but pt in dialysis. Will continue to follow. Graylon Good RN BSN 12/05/2013 1:19 PM

## 2013-12-05 NOTE — Progress Notes (Signed)
Pt refused walk stating he was too tired from dialysis. Encouraged pt to walk with night shift staff. Pt verbalized understanding. Will continue to monitor pt closely.

## 2013-12-06 MED ORDER — OXYCODONE HCL 5 MG PO TABS: 5.0000 mg | ORAL_TABLET | ORAL | Status: DC | PRN

## 2013-12-06 MED ORDER — AMLODIPINE BESYLATE 5 MG PO TABS: 5.0000 mg | ORAL_TABLET | Freq: Every day | ORAL | Status: DC

## 2013-12-06 MED ORDER — AMOXICILLIN-POT CLAVULANATE 500-125 MG PO TABS: 1.0000 | ORAL_TABLET | ORAL | Status: DC

## 2013-12-06 MED ORDER — METOPROLOL TARTRATE 50 MG PO TABS: 50.0000 mg | ORAL_TABLET | Freq: Two times a day (BID) | ORAL | Status: DC

## 2013-12-06 MED ORDER — AMIODARONE HCL 400 MG PO TABS: 400.0000 mg | ORAL_TABLET | Freq: Two times a day (BID) | ORAL | Status: DC

## 2013-12-06 MED ORDER — HYDRALAZINE HCL 50 MG PO TABS: 50.0000 mg | ORAL_TABLET | Freq: Three times a day (TID) | ORAL | Status: DC

## 2013-12-06 NOTE — Progress Notes (Addendum)
CSW (Clinical Education officer, museum) informed by facility that pt does not have SNF benefits. CSW notified RN CM and will speak to pt as well.   ADDENDUM 1:50pm: CSW spoke with pt about insurance not being able to cover SNF. Pt asked that CSW please speak to his son Nicole Kindred [phone]) as he is handling everything. CSW spoke with pt son over the phone and notified. Pt son wondering why facility had previously accepted pt if this was the case. CSW explained that facilities cannot officially run insurance until pt has accepted their bed offer, so facility was also unaware that there was no SNF benefit until this happened. Pt son understanding of this and wondering what the options are now and if private pay/payment plan was an option. CSW informed pt son that CSW would ask facility to please call him and speak about this. CSW called Santiago Glad with [facility] and informed of situation.  After speaking with pt son, facility informed CSW that it did not seem as though pt son was going to be able to pursue private payment. CSW called and left voicemail with RN CM to notify and asked to please call pt son as plan will need to be home with [facility] at this time.  [place], [place]

## 2013-12-06 NOTE — Progress Notes (Signed)
Patient ID: Gloyd Dardar, male   DOB: October 08, 1960, 53 y.o.   MRN: [id number]   [facility] Progress Note   Assessment/ Plan:   1. CAD s/p 4 vessel CABG: recovering well per TCTS note  2.ESRD: MWF via [facility]-  Routine HD Wednesday via PC (they were holding on access due to heart issues) wherever he goes, they will need to be able to provide transportation to dialysis- pt not clear about which OP unit he is affiliated with ? Will try to sort out so that they get info  to run him tomorrow or will also write orders for here if doesn't go today.  3. Anemia: Hgb better post-transfusion of 2 units. Iron studies show low Tsat- gave IV Fe - also aranesp 100 4. CKD-MBD: not on binder in house as of yet.   Not on VDRA  - will monitor for need.  Will do 4K bath today since was having arrhythmias 5. Nutrition: Monitor albumin post-op  6. Hypertension: BP well controlled post-op, monitor trends (on metoprolol- norvasc and hydralazine)- may limit volume removal- will monitor and change reg as needed- EDW ?14 ish  Subjective:    Looks good this AM- plan is for placement but dont know the options yet   Objective:   BP 134/80  Pulse 76  Temp(Src) 98.9 F (37.2 C) (Oral)  Resp 19  Ht 5\' 7"  (1.702 m)  Wt 67.903 kg (149 lb 11.2 oz)  BMI 23.44 kg/m2  SpO2 95%  Physical Exam: [id number] sitting up in recliner eating breakfast [id number] RRR, normal S1 and S2 Resp: Decreased bibasal BS, no rales [id number], flat, NT, BS normal [id number] LE edema  Labs: BMET  Recent Labs Lab 11/29/13 1835 11/30/13 0500 11/30/13 0730 11/30/13 1500 11/30/13 1524 12/01/13 0513 12/02/13 0340 12/03/13 0229 12/04/13 0624  NA 133* 135* 135*  --  143 142 138 134* 136*  K 4.8 5.2 5.1 3.4* 3.2* 4.3 3.9 3.5* 3.7  CL 100 97 97  --  101 103 98 95* 96  CO2  --  20 22  --   --  28 29 24 28   GLUCOSE 92 126* 89  --  106* 108* 112* 98 104*  BUN 21 27* 29*  --  5* 18 14 31* 20  CREATININE 4.50* 4.81*  4.90* 1.61* 1.70* 3.48* 2.87* 4.22* 3.30*  CALCIUM  --  8.5 8.5  --   --  8.3* 8.4 8.2* 8.3*  PHOS  --  6.5*  --   --   --   --   --   --   --    CBC  Recent Labs Lab 12/01/13 0513 12/02/13 0340 12/03/13 0229 12/04/13 0624  WBC 18.7* 18.9* 14.6* 10.3  HGB 7.7* 9.5* 9.3* 9.7*  HCT 23.1* 28.0* 27.2* 28.9*  MCV 99.6 91.8 90.1 93.2  PLT 243 216 247 253   Medications:    . amiodarone  400 mg Oral BID PC  . amLODipine  5 mg Oral Daily  . amoxicillin-clavulanate  1 tablet Oral Q24H  . aspirin EC  325 mg Oral Daily  . atorvastatin  20 mg Oral Daily  . Chlorhexidine Gluconate Cloth  6 each Topical Daily  . darbepoetin (ARANESP) injection - DIALYSIS  100 mcg Intravenous Q Fri-HD  . docusate sodium  200 mg Oral Daily  . ferric gluconate (FERRLECIT/NULECIT) IV  250 mg Intravenous Q M,W,F-HD  . hydrALAZINE  50 mg Oral 3 times per day  . metoprolol tartrate  50 mg Oral BID  . moving right along book   Does not apply Once  . pantoprazole  40 mg Oral QAC breakfast  . sodium chloride  3 mL Intravenous Q12H  Caralina Nop A   12/06/2013, 9:10 AM

## 2013-12-06 NOTE — Progress Notes (Signed)
Physical Therapy Treatment Patient Details Name: Daryl Weeks MRN: [id number] DOB: [birth year] Today's Date: 12-29-13    History of Present Illness S/P tricuspid valve repair and CABG    PT Comments    Pt improved to independent with bed mobility and transfers, continues to need supervision for gait without AD due to guarded gait when challenged and when fatigued.  Follow Up Recommendations  Home health PT     Equipment Recommendations  None recommended by PT    Recommendations for Other Services       Precautions / Restrictions Precautions Precautions: Sternal;Fall Restrictions Weight Bearing Restrictions: No    Mobility  Bed Mobility Overal bed mobility: Independent                Transfers Overall transfer level: Independent                  Ambulation/Gait Ambulation/Gait assistance: Supervision Ambulation Distance (Feet): 400 Feet Assistive device: None     Gait velocity interpretation: Below normal speed for age/gender General Gait Details: generally steady, holds to railing when fatigued for standing rest breaks, no LOB   Stairs            Wheelchair Mobility    Modified Rankin (Stroke Patients Only)       Balance     Sitting balance-Leahy Scale: Normal                         High Level Balance Comments: no overt LOB, but guarded    Cognition Arousal/Alertness: Awake/alert Behavior During Therapy: WFL for tasks assessed/performed Overall Cognitive Status: Within Functional Limits for tasks assessed                      Exercises      General Comments        Pertinent Vitals/Pain No c/o pain    Home Living                      Prior Function            PT Goals (current goals can now be found in the care plan section) Progress towards PT goals: Progressing toward goals    Frequency  Min 3X/week    PT Plan Discharge plan needs to be updated    End of Session   Activity  Tolerance: Patient tolerated treatment well Patient left: in bed;with call bell/phone within reach     Time: 1350-1402 PT Time Calculation (min): 12 min  Charges:  $Gait Training: 8-22 mins                    G Codes:      Daryl Weeks 2013-12-29, 2:07 PM

## 2013-12-06 NOTE — Progress Notes (Signed)
Patient attempted to BM. Light spotting of blood noted in patient's bed and on towel when RN wiped patient. No Bm noted. Will continue to monitor.   Jehu Mccauslin,MSN,RN

## 2013-12-06 NOTE — Progress Notes (Signed)
Ed completed with pt. Voiced understanding. Pt only has a tub at home, discussed not bathing in this for a while. Pt interested in CRPII and requests his name be sent to [facility].  [id number] [place], ACSM 10:13 AM 12/06/2013

## 2013-12-06 NOTE — Progress Notes (Signed)
Pt ambulated with RW on RA.  SPO2 93% at beginning of walk.  Dropped slowly to 84%.  Took a standing rest and focused on breathing for 1 min.  SPO2 came to 94%.  Continued walk at slow pace and SPO2 remained 88-92%.  Total of 450 ft. Pt left on RA.  Pt washing up at sink after walk.  Will con't plan of care.

## 2013-12-06 NOTE — Progress Notes (Addendum)
      [address]       [place] [address]             [phone]      7 Days Post-Op Procedure(s) (LRB): CORONARY ARTERY BYPASS GRAFTING (CABG) x 4 using endoscopically harvested right saphenous vein and left internal mammary artery (N/A) INTRAOPERATIVE TRANSESOPHAGEAL ECHOCARDIOGRAM (N/A)  Subjective:  Mr. Steffensmeier has no complaints this morning.  He is hoping to go to rehab today.  + Ambulation + BM  Objective: Vital signs in last 24 hours: Temp:  [97.8 F (36.6 C)-99.4 F (37.4 C)] 98.9 F (37.2 C) (03/26 0533) Pulse Rate:  [72-89] 76 (03/26 0533) Cardiac Rhythm:  [-] Normal sinus rhythm (03/25 2130) Resp:  [18-20] 19 (03/26 0533) BP: (122-153)/(66-88) 134/80 mmHg (03/26 0533) SpO2:  [90 %-100 %] 95 % (03/26 0533) Weight:  [149 lb 0.5 oz (67.6 kg)-156 lb 15.5 oz (71.2 kg)] 149 lb 11.2 oz (67.903 kg) (03/26 0536)  Intake/Output from previous day: 03/25 0701 - 03/26 0700 In: 120 [P.O.:120] Out: 3300   General appearance: alert, cooperative and no distress Heart: regular rate and rhythm Lungs: clear to auscultation bilaterally Abdomen: soft, non-tender; bowel sounds normal; no masses,  no organomegaly Extremities: edema trace Wound: clean and dry  Lab Results:  Recent Labs  12/04/13 0624  WBC 10.3  HGB 9.7*  HCT 28.9*  PLT 253   BMET:  Recent Labs  12/04/13 0624  NA 136*  K 3.7  CL 96  CO2 28  GLUCOSE 104*  BUN 20  CREATININE 3.30*  CALCIUM 8.3*    PT/INR: No results found for this basename: LABPROT, INR,  in the last 72 hours ABG    Component Value Date/Time   PHART 7.470* 11/30/2013 1529   HCO3 30.1* 11/30/2013 1529   TCO2 31 11/30/2013 1529   ACIDBASEDEF 3.0* 11/30/2013 0444   O2SAT 88.0 11/30/2013 1529   CBG (last 3)  No results found for this basename: GLUCAP,  in the last 72 hours  Assessment/Plan: S/P Procedure(s) (LRB): CORONARY ARTERY BYPASS GRAFTING (CABG) x 4 using endoscopically harvested right saphenous vein and  left internal mammary artery (N/A) INTRAOPERATIVE TRANSESOPHAGEAL ECHOCARDIOGRAM (N/A)  1. CV-NSR rate and pressure control- continue Amiodarone, Norvasc, Lopressor, Hydralazine 2. Pulm- no acute issues, continue Augmentin for RLL infiltrates, continue IS/Flutter Valve 3. Renal- ESRD on HD, Nephrology following 4. Deconditioning- to SNF to discharge 5. Dispo- patient is doing well, he is medically stable, to SNF today if bed available   LOS: 7 days    Ellwood Handler 12/06/2013   Chart reviewed, patient examined, agree with above. He is ready for discharge. Apparently the plan is for him to do HD here tomorrow and then go home with his son afterward.

## 2013-12-07 LAB — RENAL FUNCTION PANEL
Albumin: 1.8 g/dL — ABNORMAL LOW (ref 3.5–5.2)
BUN: 30 mg/dL — AB (ref 6–23)
CALCIUM: 8.5 mg/dL (ref 8.4–10.5)
CO2: 25 mEq/L (ref 19–32)
CREATININE: 4.97 mg/dL — AB (ref 0.50–1.35)
Chloride: 89 mEq/L — ABNORMAL LOW (ref 96–112)
GFR calc non Af Amer: 12 mL/min — ABNORMAL LOW (ref >90)
GFR, EST AFRICAN AMERICAN: 14 mL/min — AB (ref >90)
GLUCOSE: 104 mg/dL — AB (ref 70–99)
Phosphorus: 3 mg/dL (ref 2.3–4.6)
Potassium: 3.9 mEq/L (ref 3.7–5.3)
SODIUM: 129 meq/L — AB (ref 137–147)

## 2013-12-07 LAB — CBC
HCT: 26.5 % — ABNORMAL LOW (ref 39.0–52.0)
HEMOGLOBIN: 9.1 g/dL — AB (ref 13.0–17.0)
MCH: 31 pg (ref 26.0–34.0)
MCHC: 34.3 g/dL (ref 30.0–36.0)
MCV: 90.1 fL (ref 78.0–100.0)
Platelets: 339 10*3/uL (ref 150–400)
RBC: 2.94 MIL/uL — ABNORMAL LOW (ref 4.22–5.81)
RDW: 16.2 % — ABNORMAL HIGH (ref 11.5–15.5)
WBC: 11.5 10*3/uL — ABNORMAL HIGH (ref 4.0–10.5)

## 2013-12-07 MED ORDER — DARBEPOETIN ALFA-POLYSORBATE 40 MCG/0.4ML IJ SOLN
INTRAMUSCULAR | Status: AC
  Administered 2013-12-07: 40 ug
  Filled 2013-12-07: qty 0.4

## 2013-12-07 MED ORDER — AMLODIPINE BESYLATE 5 MG PO TABS: 5.0000 mg | ORAL_TABLET | Freq: Every day | ORAL | Status: DC

## 2013-12-07 MED ORDER — METOPROLOL TARTRATE 50 MG PO TABS: 50.0000 mg | ORAL_TABLET | Freq: Two times a day (BID) | ORAL | Status: DC

## 2013-12-07 MED ORDER — AMOXICILLIN-POT CLAVULANATE 500-125 MG PO TABS: 1.0000 | ORAL_TABLET | ORAL | Status: DC

## 2013-12-07 MED ORDER — HYDRALAZINE HCL 50 MG PO TABS: 50.0000 mg | ORAL_TABLET | Freq: Three times a day (TID) | ORAL | Status: DC

## 2013-12-07 MED ORDER — AMIODARONE HCL 200 MG PO TABS: 400.0000 mg | ORAL_TABLET | Freq: Two times a day (BID) | ORAL | Status: DC

## 2013-12-07 MED ORDER — DARBEPOETIN ALFA-POLYSORBATE 100 MCG/0.5ML IJ SOLN
INTRAMUSCULAR | Status: AC
  Administered 2013-12-07: 60 ug via INTRAVENOUS
  Filled 2013-12-07: qty 0.5

## 2013-12-07 NOTE — Progress Notes (Signed)
Patient ID: Daryl Weeks, male   DOB: 1961-04-04, 53 y.o.   MRN: [id number]   [facility] Progress Note   Assessment/ Plan:   1. CAD s/p 4 vessel CABG: recovering well per TCTS note  2.ESRD: MWF via [facility]-  Routine HD todayvia [facility] (they were holding on access due to heart issues) wherever he goes, they will need to be able to provide transportation to dialysis- pt not clear about which OP unit he is affiliated with ? Will try to sort out. Now maybe going home? Will make sure that his home unit is ready to receive him on Monday is does go 3. Anemia: Hgb better post-transfusion of 2 units. Iron studies show low Tsat- gave IV Fe - also aranesp 100 4. CKD-MBD: not on binder in house as of yet.   Not on VDRA  - will monitor for need.  Will do 4K bath today since was having arrhythmias 5. Nutrition: Monitor albumin post-op  6. Hypertension: BP well controlled post-op, monitor trends (on metoprolol- norvasc and hydralazine)- may limit volume removal- will monitor and change reg as needed- EDW ?105 ish  Subjective:    Looks good this AM, seen on HD - plan is now for home with [facility]   Objective:   BP 138/76  Pulse 78  Temp(Src) 98 F (36.7 C) (Oral)  Resp 16  Ht 5\' 7"  (1.702 m)  Wt 68.811 kg (151 lb 11.2 oz)  BMI 23.75 kg/m2  SpO2 93%  Physical Exam: [id number] on dialysis [id number] RRR, normal S1 and S2 Resp: Decreased bibasal BS, no rales [id number], flat, NT, BS normal [id number] LE edema  Labs: BMET  Recent Labs Lab 11/30/13 1500 11/30/13 1524 12/01/13 0513 12/02/13 0340 12/03/13 0229 12/04/13 0624  NA  --  143 142 138 134* 136*  K 3.4* 3.2* 4.3 3.9 3.5* 3.7  CL  --  101 103 98 95* 96  CO2  --   --  28 29 24 28   GLUCOSE  --  106* 108* 112* 98 104*  BUN  --  5* 18 14 31* 20  CREATININE 1.61* 1.70* 3.48* 2.87* 4.22* 3.30*  CALCIUM  --   --  8.3* 8.4 8.2* 8.3*   CBC  Recent Labs Lab 12/01/13 0513 12/02/13 0340 12/03/13 0229 12/04/13 0624  WBC  18.7* 18.9* 14.6* 10.3  HGB 7.7* 9.5* 9.3* 9.7*  HCT 23.1* 28.0* 27.2* 28.9*  MCV 99.6 91.8 90.1 93.2  PLT 243 216 247 253   Medications:    . amiodarone  400 mg Oral BID PC  . amLODipine  5 mg Oral Daily  . amoxicillin-clavulanate  1 tablet Oral Q24H  . aspirin EC  325 mg Oral Daily  . atorvastatin  20 mg Oral Daily  . darbepoetin (ARANESP) injection - DIALYSIS  100 mcg Intravenous Q Fri-HD  . docusate sodium  200 mg Oral Daily  . ferric gluconate (FERRLECIT/NULECIT) IV  250 mg Intravenous Q M,W,F-HD  . hydrALAZINE  50 mg Oral 3 times per day  . metoprolol tartrate  50 mg Oral BID  . moving right along book   Does not apply Once  . pantoprazole  40 mg Oral QAC breakfast  . sodium chloride  3 mL Intravenous Q12H  Daryl Weeks A   12/07/2013, 9:51 AM

## 2013-12-07 NOTE — Procedures (Signed)
Patient was seen on dialysis and the procedure was supervised.  BFR 400  Via PC BP is  138/76.   Patient appears to be tolerating treatment well  Daryl Weeks A 12/07/2013

## 2013-12-07 NOTE — Progress Notes (Addendum)
      [address]       [place] [address]             [phone]      8 Days Post-Op Procedure(s) (LRB): CORONARY ARTERY BYPASS GRAFTING (CABG) x 4 using endoscopically harvested right saphenous vein and left internal mammary artery (N/A) INTRAOPERATIVE TRANSESOPHAGEAL ECHOCARDIOGRAM (N/A)  Subjective:  [name] has no complaints this morning.  He did have some blood with a BM last night, but per patient he has been having this, colonoscopy done by GI and polyps were removed.  The patients insurance has denied SNF placement.  Therefore, he will be discharged home with home health.  Objective: Vital signs in last 24 hours: Temp:  [98.2 F (36.8 C)] 98.2 F (36.8 C) (03/27 0555) Pulse Rate:  [72-91] 91 (03/27 0555) Cardiac Rhythm:  [-] Normal sinus rhythm (03/26 2100) Resp:  [18] 18 (03/27 0555) BP: (125-143)/(78-84) 143/84 mmHg (03/27 0555) SpO2:  [94 %-96 %] 96 % (03/27 0555) Weight:  [151 lb 11.2 oz (68.811 kg)] 151 lb 11.2 oz (68.811 kg) (03/27 0548)  Intake/Output from previous day: 03/26 0701 - 03/27 0700 In: 723 [P.O.:720; I.V.:3] Out: -   General appearance: alert, cooperative and no distress Heart: regular rate and rhythm Lungs: clear to auscultation bilaterally Abdomen: soft, non-tender; bowel sounds normal; no masses,  no organomegaly Extremities: edema trace Wound: clean and dry  Lab Results: No results found for this basename: WBC, HGB, HCT, PLT,  in the last 72 hours BMET: No results found for this basename: NA, K, CL, CO2, GLUCOSE, BUN, CREATININE, CALCIUM,  in the last 72 hours  PT/INR: No results found for this basename: LABPROT, INR,  in the last 72 hours ABG    Component Value Date/Time   PHART 7.470* 11/30/2013 1529   HCO3 30.1* 11/30/2013 1529   TCO2 31 11/30/2013 1529   ACIDBASEDEF 3.0* 11/30/2013 0444   O2SAT 88.0 11/30/2013 1529   CBG (last 3)  No results found for this basename: GLUCAP,  in the last 72  hours  Assessment/Plan: S/P Procedure(s) (LRB): CORONARY ARTERY BYPASS GRAFTING (CABG) x 4 using endoscopically harvested right saphenous vein and left internal mammary artery (N/A) INTRAOPERATIVE TRANSESOPHAGEAL ECHOCARDIOGRAM (N/A)  1. CV- NSR, pressure elevated this morning- continue Amiodarone, Lopressor, Norvasc, and Hydralazine 2. Pulm- no acute issues, off oxygen, encouraged continued use of IS at discharge 3. Renal- ESRD, Nephrology following, HD today 4. Deconditioning- SNF declined per insurance, H/H arranged 5. Dispo- patient is doing well, H/H is arranged, HD today- will assess after dialysis if patient feels okay may d/c home this afternoon vs tomorrow AM if not feeling well after dialysis   LOS: 8 days    Ellwood Handler 12/07/2013   Chart reviewed, patient examined, agree with above.

## 2013-12-07 NOTE — Progress Notes (Signed)
CSW (Clinical Education officer, museum) has spoke with facility and they have worked out insurance so pt can be admitted today. CSW provided pt son with dc packet and sent dc summary to facility. Pt son to transport pt. Facility and pt nurse informed. CSW signing off.  [place], [place]

## 2013-12-07 NOTE — Progress Notes (Signed)
Assessment unchanged. Discussed D/C instructions with pt and family including f/u appointments and medication changes. Verbalized understanding. IV and tele removed. Pt left via W/C with belonging accompanied by NT. Packet given to son to bring to facility

## 2013-12-18 ENCOUNTER — Other Ambulatory Visit: Payer: Self-pay | Admitting: *Deleted

## 2013-12-18 DIAGNOSIS — I2510 Atherosclerotic heart disease of native coronary artery without angina pectoris: Secondary | ICD-10-CM

## 2013-12-21 ENCOUNTER — Encounter: Payer: Self-pay | Admitting: Cardiovascular Disease

## 2013-12-21 ENCOUNTER — Ambulatory Visit (INDEPENDENT_AMBULATORY_CARE_PROVIDER_SITE_OTHER): Payer: PRIVATE HEALTH INSURANCE | Admitting: Cardiovascular Disease

## 2013-12-21 VITALS — BP 130/72 | HR 60 | Ht 67.0 in | Wt 160.0 lb

## 2013-12-21 DIAGNOSIS — I2510 Atherosclerotic heart disease of native coronary artery without angina pectoris: Secondary | ICD-10-CM

## 2013-12-21 DIAGNOSIS — I5032 Chronic diastolic (congestive) heart failure: Secondary | ICD-10-CM

## 2013-12-21 DIAGNOSIS — Z720 Tobacco use: Secondary | ICD-10-CM

## 2013-12-21 DIAGNOSIS — E785 Hyperlipidemia, unspecified: Secondary | ICD-10-CM

## 2013-12-21 DIAGNOSIS — F17200 Nicotine dependence, unspecified, uncomplicated: Secondary | ICD-10-CM

## 2013-12-21 DIAGNOSIS — I10 Essential (primary) hypertension: Secondary | ICD-10-CM

## 2013-12-21 MED ORDER — ZOLPIDEM TARTRATE 5 MG PO TABS: 5.0000 mg | ORAL_TABLET | Freq: Every evening | ORAL | Status: DC | PRN

## 2013-12-21 NOTE — Assessment & Plan Note (Addendum)
He is doing very well after her CABG on March 19. I encouraged him to attend cardiac rehabilitation. Continue current medications. He needs to have an AV fistula surgery for dialysis access. This can be done after 6 weeks of CABG. He is having problems with insomnia. I provided him with Ambien 5 mg at bedtime as needed but explained to him that I cannot provide refills on this. This is just short-term after his CABG.

## 2013-12-21 NOTE — Patient Instructions (Addendum)
You can have the AV fistula surgery after May 1st.   Your physician has recommended you make the following change in your medication:  Ambien 5 mg daily at bedtime as needed for sleep   Your physician recommends that you schedule a follow-up appointment in:  3 months   Call [phone] - Quit - Now for help with stopping smoking

## 2013-12-21 NOTE — Progress Notes (Signed)
HPI  This is a 53 year old Caucasian male who is here today for followup visit regarding coronary artery disease. He has prolonged history of tobacco use as well as previous excessive alcohol use.  He has end-stage renal disease on hemodialysis on Monday Wednesday Friday due to IgA nephropathy with nephrotic syndrome. Other conditions include hypertension and hyperlipidemia. He was started on dialysis in February and presented a few days later with severe dyspnea at rest. He was noted to be significantly hypertensive with a blood pressure of 216/124 on presentation. He was hypoxic with evidence of pulmonary edema. He reported gradual decline in functional capacity. He also noticed dark stools. He was noted to be anemic with a hemoglobin of 8.6. Troponin was elevated at 1.5 with BNP greater than 100,000. Echocardiogram showed normal LV systolic function with mild mitral regurgitation and moderate tricuspid regurgitation. He improved significantly with blood pressure control and fluid removal during dialysis. He underwent blood transfusion. Cardiac catheterization showed significant three-vessel coronary artery disease. He underwent EGD and colonoscopy by Dr.Wohl which showed multiple colon polyps which were resected. A total of 14 polyps were removed and thought to be benign.  He underwent CABG on March 19. He had postoperative fibrillation which was controlled with amiodarone. He was discharged to inpatient rehabilitation. He has been doing well and progressing nicely. He continues to have pain at the surgical site but there is no tightness. Dyspnea improved significantly. He is trying to quit smoking. He drinks beer only rarely now.  No Known Allergies   Current Outpatient Prescriptions on File Prior to Visit  Medication Sig Dispense Refill  . amiodarone (PACERONE) 200 MG tablet Take 2 tablets (400 mg total) by mouth 2 (two) times daily after a meal. For 7 Days, then decrease to 200 mg BID for 7  Days, then decrease to 200 mg daily  90 tablet  1  . amLODipine (NORVASC) 5 MG tablet Take 1 tablet (5 mg total) by mouth daily.  30 tablet  3  . amoxicillin-clavulanate (AUGMENTIN) 500-125 MG per tablet Take 1 tablet (500 mg total) by mouth daily. For 10 Days  10 tablet  0  . aspirin 325 MG tablet Take 325 mg by mouth daily.      [name] atorvastatin (LIPITOR) 20 MG tablet Take 1 tablet (20 mg total) by mouth daily.  30 tablet  3  . hydrALAZINE (APRESOLINE) 50 MG tablet Take 1 tablet (50 mg total) by mouth every 8 (eight) hours.  90 tablet  3  . metoprolol (LOPRESSOR) 50 MG tablet Take 1 tablet (50 mg total) by mouth 2 (two) times daily.  60 tablet  3  . oxyCODONE (OXY IR/ROXICODONE) 5 MG immediate release tablet Take 1-2 tablets (5-10 mg total) by mouth every 3 (three) hours as needed for severe pain.  30 tablet  0   No current facility-administered medications on file prior to visit.     Past Medical History  Diagnosis Date  . CHF (congestive heart failure)   . COPD (chronic obstructive pulmonary disease)   . ETOH abuse   . Tobacco abuse   . Non-ST elevation MI (NSTEMI)   . Colon polyps   . Anemia   . Tobacco abuse   . Coronary artery disease     Non-ST elevation myocardial infarction in February of 2015. In the setting of hypertensive urgency and blood loss anemia. Cardiac catheterization showed significant three-vessel coronary artery disease.. The coronary arteries were moderately to heavily calcified. Ejection fraction was  55% by echo.   . Hyperlipidemia   . Hypertension   . Lower GI bleed     Due to colon polyps. Status post resection of 14 polyps  . Shortness of breath   . Pneumonia     hx  . End stage renal disease     m-w-f   -[facility]  . IgA nephropathy   . GERD (gastroesophageal reflux disease)     hx  . Arthritis      Past Surgical History  Procedure Laterality Date  . Cardiac catheterization      RCA 90% and calcified mid LAD 80% Stenosis  . Renal biopsy  Left 14  . Dialysis catheterr  2/15  . Coronary artery bypass graft N/A 11/29/2013    Procedure: CORONARY ARTERY BYPASS GRAFTING (CABG) x 4 using endoscopically harvested right saphenous vein and left internal mammary artery;  Surgeon: [name], MD;  Location: [facility];  Service: Open Heart Surgery;  Laterality: N/A;  . Intraoperative transesophageal echocardiogram N/A 11/29/2013    Procedure: INTRAOPERATIVE TRANSESOPHAGEAL ECHOCARDIOGRAM;  Surgeon: [name], MD;  Location: [facility];  Service: Open Heart Surgery;  Laterality: N/A;     Family History  Problem Relation Age of Onset  . Heart disease Father   . Heart disease Brother      History   Social History  . Marital Status: Single    Spouse Name: N/A    Number of Children: N/A  . Years of Education: N/A   Occupational History  . Not on file.   Social History Main Topics  . Smoking status: Current Every Day Smoker -- 0.50 packs/day for 30 years    Types: Cigarettes  . Smokeless tobacco: Never Used  . Alcohol Use: 3.6 oz/week    6 Cans of beer per week     Comment: daily less now  . Drug Use: No  . Sexual Activity: Not on file   Other Topics Concern  . Not on file   Social History Narrative  . No narrative on file    PHYSICAL EXAM   BP 130/72  Pulse 60  Ht 5\' 7"  (1.702 m)  Wt 160 lb (72.576 kg)  BMI 25.05 kg/m2 Constitutional: He is oriented to person, place, and time. He appears well-developed and well-nourished. No distress.  HENT: No nasal discharge.  Head: Normocephalic and atraumatic.  Eyes: Pupils are equal and round. Right eye exhibits no discharge. Left eye exhibits no discharge.  Neck: Normal range of motion. Neck supple. No JVD present. No thyromegaly present.  Cardiovascular: Normal rate, regular rhythm, normal heart sounds and. Exam reveals no gallop and no friction rub. No murmur heard.  Pulmonary/Chest: Effort normal and breath sounds normal. No stridor. No respiratory distress. He has no  wheezes. He has no rales. He exhibits no tenderness.  Abdominal: Soft. Bowel sounds are normal. He exhibits no distension. There is no tenderness. There is no rebound and no guarding.  Musculoskeletal: Normal range of motion. He exhibits trace edema .  Neurological: He is alert and oriented to person, place, and time. Coordination normal.  Skin: Skin is warm and dry. Possible spider angiomas noted on the chest. He is not diaphoretic. No erythema. No pallor.  Psychiatric: He has a normal mood and affect. His behavior is normal. Judgment and thought content normal.  Right groin is intact with no hematoma or tenderness   EKG: Normal sinus rhythm with l incomplete left bundle branch block. ASSESSMENT AND PLAN

## 2013-12-21 NOTE — Assessment & Plan Note (Signed)
Blood pressure is well controlled on current medications. 

## 2013-12-21 NOTE — Assessment & Plan Note (Signed)
Continue treatment with atorvastatin. 

## 2013-12-21 NOTE — Assessment & Plan Note (Signed)
He appears to be euvolemic. 

## 2013-12-21 NOTE — Assessment & Plan Note (Signed)
I strongly advised him to quit smoking and provided him with resources.

## 2013-12-26 ENCOUNTER — Other Ambulatory Visit: Payer: Self-pay | Admitting: *Deleted

## 2013-12-26 ENCOUNTER — Ambulatory Visit
Admission: RE | Admit: 2013-12-26 | Discharge: 2013-12-26 | Disposition: A | Discharge status: Home / Self Care | Payer: Medicaid Other | Source: Ambulatory Visit | Attending: Surgery | Admitting: Surgery

## 2013-12-26 ENCOUNTER — Other Ambulatory Visit: Payer: Self-pay | Admitting: Cardiovascular Disease

## 2013-12-26 ENCOUNTER — Encounter: Payer: Self-pay | Admitting: Surgery

## 2013-12-26 ENCOUNTER — Ambulatory Visit (INDEPENDENT_AMBULATORY_CARE_PROVIDER_SITE_OTHER): Payer: PRIVATE HEALTH INSURANCE | Admitting: Surgery

## 2013-12-26 VITALS — BP 146/85 | HR 61 | Resp 16 | Ht 69.0 in

## 2013-12-26 DIAGNOSIS — Z951 Presence of aortocoronary bypass graft: Secondary | ICD-10-CM

## 2013-12-26 DIAGNOSIS — G8918 Other acute postprocedural pain: Secondary | ICD-10-CM

## 2013-12-26 DIAGNOSIS — I2510 Atherosclerotic heart disease of native coronary artery without angina pectoris: Secondary | ICD-10-CM

## 2013-12-26 MED ORDER — TRAMADOL HCL 50 MG PO TABS: 50.0000 mg | ORAL_TABLET | Freq: Four times a day (QID) | ORAL | Status: DC | PRN

## 2013-12-26 NOTE — Progress Notes (Signed)
HPI:  Patient returns for routine postoperative follow-up having undergone CABG x 4 on 11/30/2014. The patient's early postoperative recovery while in the hospital was notable for postoperative atrial fibrillation converted with amiodarone. Since hospital discharge the patient reports that he has been feeling well. He is walking daily with no chest pain or dyspnea. He has been tolerating hemodialysis. His appetite has been poor but he denies nausea. He has had some diarrhea.   Current Outpatient Prescriptions  Medication Sig Dispense Refill  . amiodarone (PACERONE) 200 MG tablet Take 2 tablets (400 mg total) by mouth 2 (two) times daily after a meal. For 7 Days, then decrease to 200 mg BID for 7 Days, then decrease to 200 mg daily  90 tablet  1  . amLODipine (NORVASC) 5 MG tablet Take 1 tablet (5 mg total) by mouth daily.  30 tablet  3  . aspirin 325 MG tablet Take 325 mg by mouth daily.      [name] atorvastatin (LIPITOR) 20 MG tablet Take 1 tablet (20 mg total) by mouth daily.  30 tablet  3  . hydrALAZINE (APRESOLINE) 50 MG tablet Take 1 tablet (50 mg total) by mouth every 8 (eight) hours.  90 tablet  3  . metoprolol (LOPRESSOR) 50 MG tablet Take 1 tablet (50 mg total) by mouth 2 (two) times daily.  60 tablet  3  . oxyCODONE (OXY IR/ROXICODONE) 5 MG immediate release tablet Take 1-2 tablets (5-10 mg total) by mouth every 3 (three) hours as needed for severe pain.  30 tablet  0  . zolpidem (AMBIEN) 5 MG tablet Take 1 tablet (5 mg total) by mouth at bedtime as needed for sleep.  30 tablet  0   No current facility-administered medications for this visit.    Physical Exam: BP 146/85  Pulse 61  Resp 16  Ht 5\' 9"  (1.753 m)  SpO2 98% He looks well. Lung exam is clear. Cardiac exam shows a regular rate and rhythm with normal heart sounds. Chest incision is healing well and sternum is stable. The leg incisions are healing well and there is mild peripheral edema worse on the right than the  left lower leg.   Diagnostic Tests:  CLINICAL DATA: Follow-up CABG  EXAM:  CHEST 2 VIEW  COMPARISON: 12/04/2013  FINDINGS:  Prior right lung airspace opacities have improved/resolved. Trace  right pleural effusion.  Stable platelike atelectasis in the left lower lobe.  The heart is normal in size. Postsurgical changes related to prior  CABG.  Stable right IJ dual lumen catheter.  IMPRESSION:  Prior right lung airspace opacities have improved/resolved.  Trace right pleural effusion.  Stable platelike atelectasis in the left lower lobe.  Electronically Signed  By: Julian Hy M.D.  On: 12/26/2013 15:00   Impression:  Overall I think he is doing well. I encouraged him to continue walking. He is planning to participate in cardiac rehab. I told him he could drive his car but should not lift anything heavier than 10 lbs for three months postop. The amiodarone may be decreasing his appetite and he will discontinue this when his current prescription is complete. The diarrhea is likely due to the Augmentin that he was on for lung infiltrates. I told him he could discontinue this. He is planning to have a dialysis fistula placed in May.   Plan:  He will continue to follow up with Dr. Fletcher Anon and will contact my office if he has any problems with his incisions.

## 2014-01-01 ENCOUNTER — Telehealth: Payer: Self-pay | Admitting: *Deleted

## 2014-01-01 NOTE — Telephone Encounter (Signed)
Pt to have [facility] send clearance form 4/21.

## 2014-01-01 NOTE — Telephone Encounter (Signed)
Patient's son called and a letter needs to be sent to [facility] w/ Dr. Lucky Cowboy for  Fistula or graph. Dr. Fletcher Anon has already stated that the patient is cleared for surgery. Please do stat?

## 2014-01-10 ENCOUNTER — Ambulatory Visit: Payer: Self-pay | Admitting: Vascular Surgery

## 2014-01-10 DIAGNOSIS — I2510 Atherosclerotic heart disease of native coronary artery without angina pectoris: Secondary | ICD-10-CM | POA: Diagnosis not present

## 2014-01-10 DIAGNOSIS — I10 Essential (primary) hypertension: Secondary | ICD-10-CM | POA: Diagnosis not present

## 2014-01-10 DIAGNOSIS — N186 End stage renal disease: Secondary | ICD-10-CM | POA: Diagnosis not present

## 2014-01-10 LAB — URINALYSIS, COMPLETE
Bilirubin,UR: NEGATIVE
Glucose,UR: NEGATIVE mg/dL (ref 0–75)
Nitrite: NEGATIVE
Ph: 5 (ref 4.5–8.0)
SPECIFIC GRAVITY: 1.022 (ref 1.003–1.030)
Squamous Epithelial: 7

## 2014-01-10 LAB — CBC
HCT: 29 % — AB (ref 40.0–52.0)
HGB: 9.2 g/dL — ABNORMAL LOW (ref 13.0–18.0)
MCH: 30.5 pg (ref 26.0–34.0)
MCHC: 31.6 g/dL — AB (ref 32.0–36.0)
MCV: 97 fL (ref 80–100)
PLATELETS: 188 10*3/uL (ref 150–440)
RBC: 3 10*6/uL — ABNORMAL LOW (ref 4.40–5.90)
RDW: 20.9 % — AB (ref 11.5–14.5)
WBC: 21.5 10*3/uL — ABNORMAL HIGH (ref 3.8–10.6)

## 2014-01-11 ENCOUNTER — Inpatient Hospital Stay: Payer: Self-pay | Admitting: Internal Medicine

## 2014-01-11 DIAGNOSIS — I498 Other specified cardiac arrhythmias: Secondary | ICD-10-CM | POA: Diagnosis present

## 2014-01-11 DIAGNOSIS — R509 Fever, unspecified: Secondary | ICD-10-CM | POA: Diagnosis not present

## 2014-01-11 DIAGNOSIS — I509 Heart failure, unspecified: Secondary | ICD-10-CM | POA: Diagnosis present

## 2014-01-11 DIAGNOSIS — B961 Klebsiella pneumoniae [K. pneumoniae] as the cause of diseases classified elsewhere: Secondary | ICD-10-CM | POA: Diagnosis not present

## 2014-01-11 DIAGNOSIS — I2510 Atherosclerotic heart disease of native coronary artery without angina pectoris: Secondary | ICD-10-CM | POA: Diagnosis present

## 2014-01-11 DIAGNOSIS — F17200 Nicotine dependence, unspecified, uncomplicated: Secondary | ICD-10-CM | POA: Diagnosis present

## 2014-01-11 DIAGNOSIS — R6883 Chills (without fever): Secondary | ICD-10-CM | POA: Diagnosis not present

## 2014-01-11 DIAGNOSIS — A419 Sepsis, unspecified organism: Secondary | ICD-10-CM | POA: Diagnosis present

## 2014-01-11 DIAGNOSIS — I120 Hypertensive chronic kidney disease with stage 5 chronic kidney disease or end stage renal disease: Secondary | ICD-10-CM | POA: Diagnosis not present

## 2014-01-11 DIAGNOSIS — J189 Pneumonia, unspecified organism: Secondary | ICD-10-CM | POA: Diagnosis present

## 2014-01-11 DIAGNOSIS — D631 Anemia in chronic kidney disease: Secondary | ICD-10-CM | POA: Diagnosis not present

## 2014-01-11 DIAGNOSIS — Z951 Presence of aortocoronary bypass graft: Secondary | ICD-10-CM | POA: Diagnosis not present

## 2014-01-11 DIAGNOSIS — E785 Hyperlipidemia, unspecified: Secondary | ICD-10-CM | POA: Diagnosis present

## 2014-01-11 DIAGNOSIS — T827XXA Infection and inflammatory reaction due to other cardiac and vascular devices, implants and grafts, initial encounter: Secondary | ICD-10-CM | POA: Diagnosis present

## 2014-01-11 DIAGNOSIS — N390 Urinary tract infection, site not specified: Secondary | ICD-10-CM | POA: Diagnosis not present

## 2014-01-11 DIAGNOSIS — A4159 Other Gram-negative sepsis: Secondary | ICD-10-CM | POA: Diagnosis present

## 2014-01-11 DIAGNOSIS — I252 Old myocardial infarction: Secondary | ICD-10-CM | POA: Diagnosis not present

## 2014-01-11 DIAGNOSIS — Z992 Dependence on renal dialysis: Secondary | ICD-10-CM | POA: Diagnosis not present

## 2014-01-11 DIAGNOSIS — I739 Peripheral vascular disease, unspecified: Secondary | ICD-10-CM | POA: Diagnosis present

## 2014-01-11 DIAGNOSIS — Z86010 Personal history of colonic polyps: Secondary | ICD-10-CM | POA: Diagnosis not present

## 2014-01-11 DIAGNOSIS — D509 Iron deficiency anemia, unspecified: Secondary | ICD-10-CM | POA: Diagnosis not present

## 2014-01-11 DIAGNOSIS — N059 Unspecified nephritic syndrome with unspecified morphologic changes: Secondary | ICD-10-CM | POA: Diagnosis present

## 2014-01-11 DIAGNOSIS — Z23 Encounter for immunization: Secondary | ICD-10-CM | POA: Diagnosis not present

## 2014-01-11 DIAGNOSIS — R0902 Hypoxemia: Secondary | ICD-10-CM | POA: Diagnosis present

## 2014-01-11 DIAGNOSIS — I10 Essential (primary) hypertension: Secondary | ICD-10-CM | POA: Diagnosis not present

## 2014-01-11 DIAGNOSIS — N186 End stage renal disease: Secondary | ICD-10-CM | POA: Diagnosis not present

## 2014-01-11 DIAGNOSIS — N039 Chronic nephritic syndrome with unspecified morphologic changes: Secondary | ICD-10-CM | POA: Diagnosis not present

## 2014-01-11 DIAGNOSIS — I5032 Chronic diastolic (congestive) heart failure: Secondary | ICD-10-CM | POA: Diagnosis present

## 2014-01-11 DIAGNOSIS — J441 Chronic obstructive pulmonary disease with (acute) exacerbation: Secondary | ICD-10-CM | POA: Diagnosis present

## 2014-01-11 DIAGNOSIS — Z79899 Other long term (current) drug therapy: Secondary | ICD-10-CM | POA: Diagnosis not present

## 2014-01-11 LAB — COMPREHENSIVE METABOLIC PANEL
ALBUMIN: 2.2 g/dL — AB (ref 3.4–5.0)
ALK PHOS: 133 U/L — AB
Anion Gap: 7 (ref 7–16)
BILIRUBIN TOTAL: 0.3 mg/dL (ref 0.2–1.0)
BUN: 12 mg/dL (ref 7–18)
CO2: 27 mmol/L (ref 21–32)
CREATININE: 3.04 mg/dL — AB (ref 0.60–1.30)
Calcium, Total: 8.1 mg/dL — ABNORMAL LOW (ref 8.5–10.1)
Chloride: 102 mmol/L (ref 98–107)
EGFR (African American): 26 — ABNORMAL LOW
GFR CALC NON AF AMER: 23 — AB
Glucose: 141 mg/dL — ABNORMAL HIGH (ref 65–99)
Osmolality: 274 (ref 275–301)
POTASSIUM: 2.9 mmol/L — AB (ref 3.5–5.1)
SGOT(AST): 34 U/L (ref 15–37)
SGPT (ALT): 29 U/L (ref 12–78)
Sodium: 136 mmol/L (ref 136–145)
Total Protein: 6.5 g/dL (ref 6.4–8.2)

## 2014-01-11 LAB — CBC
HCT: 35.3 % — AB (ref 40.0–52.0)
HGB: 11.1 g/dL — AB (ref 13.0–18.0)
MCH: 30.5 pg (ref 26.0–34.0)
MCHC: 31.6 g/dL — ABNORMAL LOW (ref 32.0–36.0)
MCV: 97 fL (ref 80–100)
Platelet: 229 10*3/uL (ref 150–440)
RBC: 3.65 10*6/uL — AB (ref 4.40–5.90)
RDW: 20.5 % — AB (ref 11.5–14.5)
WBC: 16.4 10*3/uL — ABNORMAL HIGH (ref 3.8–10.6)

## 2014-01-11 LAB — BASIC METABOLIC PANEL
Anion Gap: 9 (ref 7–16)
BUN: 19 mg/dL — ABNORMAL HIGH (ref 7–18)
CALCIUM: 7.9 mg/dL — AB (ref 8.5–10.1)
CO2: 28 mmol/L (ref 21–32)
CREATININE: 4.41 mg/dL — AB (ref 0.60–1.30)
Chloride: 96 mmol/L — ABNORMAL LOW (ref 98–107)
EGFR (African American): 17 — ABNORMAL LOW
EGFR (Non-African Amer.): 15 — ABNORMAL LOW
Glucose: 96 mg/dL (ref 65–99)
OSMOLALITY: 267 (ref 275–301)
Potassium: 3.4 mmol/L — ABNORMAL LOW (ref 3.5–5.1)
SODIUM: 133 mmol/L — AB (ref 136–145)

## 2014-01-12 LAB — URINALYSIS, COMPLETE
Bilirubin,UR: NEGATIVE
Glucose,UR: 50 mg/dL (ref 0–75)
Hyaline Cast: 5
NITRITE: NEGATIVE
PH: 5 (ref 4.5–8.0)
RBC,UR: 50 /HPF (ref 0–5)
Specific Gravity: 1.028 (ref 1.003–1.030)
Squamous Epithelial: 4

## 2014-01-13 LAB — BASIC METABOLIC PANEL
Anion Gap: 6 — ABNORMAL LOW (ref 7–16)
BUN: 19 mg/dL — AB (ref 7–18)
CREATININE: 4.45 mg/dL — AB (ref 0.60–1.30)
Calcium, Total: 8.2 mg/dL — ABNORMAL LOW (ref 8.5–10.1)
Chloride: 98 mmol/L (ref 98–107)
Co2: 28 mmol/L (ref 21–32)
EGFR (African American): 17 — ABNORMAL LOW
EGFR (Non-African Amer.): 14 — ABNORMAL LOW
Glucose: 80 mg/dL (ref 65–99)
OSMOLALITY: 266 (ref 275–301)
Potassium: 4 mmol/L (ref 3.5–5.1)
SODIUM: 132 mmol/L — AB (ref 136–145)

## 2014-01-13 LAB — CBC WITH DIFFERENTIAL/PLATELET
Basophil #: 0.1 10*3/uL (ref 0.0–0.1)
Basophil %: 1 %
EOS PCT: 2.1 %
Eosinophil #: 0.2 10*3/uL (ref 0.0–0.7)
HCT: 30.1 % — AB (ref 40.0–52.0)
HGB: 9.8 g/dL — ABNORMAL LOW (ref 13.0–18.0)
LYMPHS ABS: 1.3 10*3/uL (ref 1.0–3.6)
Lymphocyte %: 17.4 %
MCH: 31.7 pg (ref 26.0–34.0)
MCHC: 32.5 g/dL (ref 32.0–36.0)
MCV: 97 fL (ref 80–100)
Monocyte #: 1.2 x10 3/mm — ABNORMAL HIGH (ref 0.2–1.0)
Monocyte %: 15.4 %
Neutrophil #: 4.9 10*3/uL (ref 1.4–6.5)
Neutrophil %: 64.1 %
Platelet: 205 10*3/uL (ref 150–440)
RBC: 3.1 10*6/uL — ABNORMAL LOW (ref 4.40–5.90)
RDW: 20.1 % — AB (ref 11.5–14.5)
WBC: 7.7 10*3/uL (ref 3.8–10.6)

## 2014-01-14 LAB — RENAL FUNCTION PANEL
Albumin: 2 g/dL — ABNORMAL LOW (ref 3.4–5.0)
Anion Gap: 5 — ABNORMAL LOW (ref 7–16)
BUN: 29 mg/dL — AB (ref 7–18)
CO2: 29 mmol/L (ref 21–32)
Calcium, Total: 8.1 mg/dL — ABNORMAL LOW (ref 8.5–10.1)
Chloride: 97 mmol/L — ABNORMAL LOW (ref 98–107)
Creatinine: 5.69 mg/dL — ABNORMAL HIGH (ref 0.60–1.30)
EGFR (African American): 12 — ABNORMAL LOW
EGFR (Non-African Amer.): 11 — ABNORMAL LOW
Glucose: 76 mg/dL (ref 65–99)
Osmolality: 267 (ref 275–301)
PHOSPHORUS: 2.6 mg/dL (ref 2.5–4.9)
Potassium: 4.1 mmol/L (ref 3.5–5.1)
Sodium: 131 mmol/L — ABNORMAL LOW (ref 136–145)

## 2014-01-14 LAB — VANCOMYCIN, TROUGH: Vancomycin, Trough: 25 ug/mL (ref 10–20)

## 2014-01-15 LAB — URINE CULTURE

## 2014-01-16 LAB — CULTURE, BLOOD (SINGLE)

## 2014-01-17 LAB — PLATELET COUNT: Platelet: 206 10*3/uL (ref 150–440)

## 2014-01-18 DIAGNOSIS — D509 Iron deficiency anemia, unspecified: Secondary | ICD-10-CM | POA: Diagnosis not present

## 2014-01-18 DIAGNOSIS — R6883 Chills (without fever): Secondary | ICD-10-CM | POA: Diagnosis not present

## 2014-01-18 DIAGNOSIS — N186 End stage renal disease: Secondary | ICD-10-CM | POA: Diagnosis not present

## 2014-01-18 DIAGNOSIS — D631 Anemia in chronic kidney disease: Secondary | ICD-10-CM | POA: Diagnosis not present

## 2014-01-18 DIAGNOSIS — R509 Fever, unspecified: Secondary | ICD-10-CM | POA: Diagnosis not present

## 2014-01-18 DIAGNOSIS — Z23 Encounter for immunization: Secondary | ICD-10-CM | POA: Diagnosis not present

## 2014-01-18 LAB — AEROBIC CULTURE

## 2014-01-19 LAB — CULTURE, BLOOD (SINGLE)

## 2014-01-21 DIAGNOSIS — N186 End stage renal disease: Secondary | ICD-10-CM | POA: Diagnosis not present

## 2014-01-21 DIAGNOSIS — D631 Anemia in chronic kidney disease: Secondary | ICD-10-CM | POA: Diagnosis not present

## 2014-01-21 DIAGNOSIS — R509 Fever, unspecified: Secondary | ICD-10-CM | POA: Diagnosis not present

## 2014-01-21 DIAGNOSIS — D509 Iron deficiency anemia, unspecified: Secondary | ICD-10-CM | POA: Diagnosis not present

## 2014-01-21 DIAGNOSIS — R6883 Chills (without fever): Secondary | ICD-10-CM | POA: Diagnosis not present

## 2014-01-21 DIAGNOSIS — Z23 Encounter for immunization: Secondary | ICD-10-CM | POA: Diagnosis not present

## 2014-01-21 LAB — CULTURE, BLOOD (SINGLE)

## 2014-01-23 DIAGNOSIS — Z23 Encounter for immunization: Secondary | ICD-10-CM | POA: Diagnosis not present

## 2014-01-23 DIAGNOSIS — D631 Anemia in chronic kidney disease: Secondary | ICD-10-CM | POA: Diagnosis not present

## 2014-01-23 DIAGNOSIS — R509 Fever, unspecified: Secondary | ICD-10-CM | POA: Diagnosis not present

## 2014-01-23 DIAGNOSIS — N186 End stage renal disease: Secondary | ICD-10-CM | POA: Diagnosis not present

## 2014-01-23 DIAGNOSIS — N039 Chronic nephritic syndrome with unspecified morphologic changes: Secondary | ICD-10-CM | POA: Diagnosis not present

## 2014-01-23 DIAGNOSIS — R6883 Chills (without fever): Secondary | ICD-10-CM | POA: Diagnosis not present

## 2014-01-23 DIAGNOSIS — D509 Iron deficiency anemia, unspecified: Secondary | ICD-10-CM | POA: Diagnosis not present

## 2014-01-25 DIAGNOSIS — N186 End stage renal disease: Secondary | ICD-10-CM | POA: Diagnosis not present

## 2014-01-25 DIAGNOSIS — Z23 Encounter for immunization: Secondary | ICD-10-CM | POA: Diagnosis not present

## 2014-01-25 DIAGNOSIS — R509 Fever, unspecified: Secondary | ICD-10-CM | POA: Diagnosis not present

## 2014-01-25 DIAGNOSIS — D509 Iron deficiency anemia, unspecified: Secondary | ICD-10-CM | POA: Diagnosis not present

## 2014-01-25 DIAGNOSIS — D631 Anemia in chronic kidney disease: Secondary | ICD-10-CM | POA: Diagnosis not present

## 2014-01-25 DIAGNOSIS — R6883 Chills (without fever): Secondary | ICD-10-CM | POA: Diagnosis not present

## 2014-01-28 DIAGNOSIS — Z23 Encounter for immunization: Secondary | ICD-10-CM | POA: Diagnosis not present

## 2014-01-28 DIAGNOSIS — D509 Iron deficiency anemia, unspecified: Secondary | ICD-10-CM | POA: Diagnosis not present

## 2014-01-28 DIAGNOSIS — N186 End stage renal disease: Secondary | ICD-10-CM | POA: Diagnosis not present

## 2014-01-28 DIAGNOSIS — R6883 Chills (without fever): Secondary | ICD-10-CM | POA: Diagnosis not present

## 2014-01-28 DIAGNOSIS — R509 Fever, unspecified: Secondary | ICD-10-CM | POA: Diagnosis not present

## 2014-01-28 DIAGNOSIS — D631 Anemia in chronic kidney disease: Secondary | ICD-10-CM | POA: Diagnosis not present

## 2014-01-30 DIAGNOSIS — R6883 Chills (without fever): Secondary | ICD-10-CM | POA: Diagnosis not present

## 2014-01-30 DIAGNOSIS — R509 Fever, unspecified: Secondary | ICD-10-CM | POA: Diagnosis not present

## 2014-01-30 DIAGNOSIS — D631 Anemia in chronic kidney disease: Secondary | ICD-10-CM | POA: Diagnosis not present

## 2014-01-30 DIAGNOSIS — Z23 Encounter for immunization: Secondary | ICD-10-CM | POA: Diagnosis not present

## 2014-01-30 DIAGNOSIS — D509 Iron deficiency anemia, unspecified: Secondary | ICD-10-CM | POA: Diagnosis not present

## 2014-01-30 DIAGNOSIS — N186 End stage renal disease: Secondary | ICD-10-CM | POA: Diagnosis not present

## 2014-02-01 DIAGNOSIS — D509 Iron deficiency anemia, unspecified: Secondary | ICD-10-CM | POA: Diagnosis not present

## 2014-02-01 DIAGNOSIS — Z23 Encounter for immunization: Secondary | ICD-10-CM | POA: Diagnosis not present

## 2014-02-01 DIAGNOSIS — R509 Fever, unspecified: Secondary | ICD-10-CM | POA: Diagnosis not present

## 2014-02-01 DIAGNOSIS — N186 End stage renal disease: Secondary | ICD-10-CM | POA: Diagnosis not present

## 2014-02-01 DIAGNOSIS — D631 Anemia in chronic kidney disease: Secondary | ICD-10-CM | POA: Diagnosis not present

## 2014-02-01 DIAGNOSIS — R6883 Chills (without fever): Secondary | ICD-10-CM | POA: Diagnosis not present

## 2014-02-04 DIAGNOSIS — D509 Iron deficiency anemia, unspecified: Secondary | ICD-10-CM | POA: Diagnosis not present

## 2014-02-04 DIAGNOSIS — R509 Fever, unspecified: Secondary | ICD-10-CM | POA: Diagnosis not present

## 2014-02-04 DIAGNOSIS — D631 Anemia in chronic kidney disease: Secondary | ICD-10-CM | POA: Diagnosis not present

## 2014-02-04 DIAGNOSIS — Z23 Encounter for immunization: Secondary | ICD-10-CM | POA: Diagnosis not present

## 2014-02-04 DIAGNOSIS — N186 End stage renal disease: Secondary | ICD-10-CM | POA: Diagnosis not present

## 2014-02-04 DIAGNOSIS — R6883 Chills (without fever): Secondary | ICD-10-CM | POA: Diagnosis not present

## 2014-02-06 DIAGNOSIS — R509 Fever, unspecified: Secondary | ICD-10-CM | POA: Diagnosis not present

## 2014-02-06 DIAGNOSIS — Z23 Encounter for immunization: Secondary | ICD-10-CM | POA: Diagnosis not present

## 2014-02-06 DIAGNOSIS — D509 Iron deficiency anemia, unspecified: Secondary | ICD-10-CM | POA: Diagnosis not present

## 2014-02-06 DIAGNOSIS — N039 Chronic nephritic syndrome with unspecified morphologic changes: Secondary | ICD-10-CM | POA: Diagnosis not present

## 2014-02-06 DIAGNOSIS — D631 Anemia in chronic kidney disease: Secondary | ICD-10-CM | POA: Diagnosis not present

## 2014-02-06 DIAGNOSIS — N186 End stage renal disease: Secondary | ICD-10-CM | POA: Diagnosis not present

## 2014-02-06 DIAGNOSIS — R6883 Chills (without fever): Secondary | ICD-10-CM | POA: Diagnosis not present

## 2014-02-07 DIAGNOSIS — N186 End stage renal disease: Secondary | ICD-10-CM | POA: Diagnosis not present

## 2014-02-07 DIAGNOSIS — Z538 Procedure and treatment not carried out for other reasons: Secondary | ICD-10-CM | POA: Diagnosis not present

## 2014-02-08 DIAGNOSIS — N186 End stage renal disease: Secondary | ICD-10-CM | POA: Diagnosis not present

## 2014-02-08 DIAGNOSIS — R509 Fever, unspecified: Secondary | ICD-10-CM | POA: Diagnosis not present

## 2014-02-08 DIAGNOSIS — R6883 Chills (without fever): Secondary | ICD-10-CM | POA: Diagnosis not present

## 2014-02-08 DIAGNOSIS — D509 Iron deficiency anemia, unspecified: Secondary | ICD-10-CM | POA: Diagnosis not present

## 2014-02-08 DIAGNOSIS — D631 Anemia in chronic kidney disease: Secondary | ICD-10-CM | POA: Diagnosis not present

## 2014-02-08 DIAGNOSIS — Z23 Encounter for immunization: Secondary | ICD-10-CM | POA: Diagnosis not present

## 2014-02-10 DIAGNOSIS — N186 End stage renal disease: Secondary | ICD-10-CM | POA: Diagnosis not present

## 2014-02-11 DIAGNOSIS — N186 End stage renal disease: Secondary | ICD-10-CM | POA: Diagnosis not present

## 2014-02-11 DIAGNOSIS — Z23 Encounter for immunization: Secondary | ICD-10-CM | POA: Diagnosis not present

## 2014-02-11 DIAGNOSIS — N2581 Secondary hyperparathyroidism of renal origin: Secondary | ICD-10-CM | POA: Diagnosis not present

## 2014-02-11 DIAGNOSIS — D509 Iron deficiency anemia, unspecified: Secondary | ICD-10-CM | POA: Diagnosis not present

## 2014-02-11 DIAGNOSIS — D631 Anemia in chronic kidney disease: Secondary | ICD-10-CM | POA: Diagnosis not present

## 2014-02-15 ENCOUNTER — Ambulatory Visit: Payer: Self-pay | Admitting: Vascular Surgery

## 2014-02-15 DIAGNOSIS — N186 End stage renal disease: Secondary | ICD-10-CM | POA: Diagnosis not present

## 2014-02-15 DIAGNOSIS — J449 Chronic obstructive pulmonary disease, unspecified: Secondary | ICD-10-CM | POA: Diagnosis not present

## 2014-02-15 DIAGNOSIS — E785 Hyperlipidemia, unspecified: Secondary | ICD-10-CM | POA: Diagnosis not present

## 2014-02-15 DIAGNOSIS — I120 Hypertensive chronic kidney disease with stage 5 chronic kidney disease or end stage renal disease: Secondary | ICD-10-CM | POA: Diagnosis not present

## 2014-02-15 DIAGNOSIS — I2510 Atherosclerotic heart disease of native coronary artery without angina pectoris: Secondary | ICD-10-CM | POA: Diagnosis not present

## 2014-02-15 DIAGNOSIS — I739 Peripheral vascular disease, unspecified: Secondary | ICD-10-CM | POA: Diagnosis not present

## 2014-02-15 DIAGNOSIS — Z992 Dependence on renal dialysis: Secondary | ICD-10-CM | POA: Diagnosis not present

## 2014-02-15 DIAGNOSIS — Z79899 Other long term (current) drug therapy: Secondary | ICD-10-CM | POA: Diagnosis not present

## 2014-02-15 DIAGNOSIS — Z951 Presence of aortocoronary bypass graft: Secondary | ICD-10-CM | POA: Diagnosis not present

## 2014-02-19 DIAGNOSIS — Z0271 Encounter for disability determination: Secondary | ICD-10-CM

## 2014-03-12 DIAGNOSIS — N186 End stage renal disease: Secondary | ICD-10-CM | POA: Diagnosis not present

## 2014-03-13 DIAGNOSIS — D509 Iron deficiency anemia, unspecified: Secondary | ICD-10-CM | POA: Diagnosis not present

## 2014-03-13 DIAGNOSIS — N186 End stage renal disease: Secondary | ICD-10-CM | POA: Diagnosis not present

## 2014-03-13 DIAGNOSIS — D631 Anemia in chronic kidney disease: Secondary | ICD-10-CM | POA: Diagnosis not present

## 2014-04-12 DIAGNOSIS — N186 End stage renal disease: Secondary | ICD-10-CM | POA: Diagnosis not present

## 2014-04-15 DIAGNOSIS — T82898A Other specified complication of vascular prosthetic devices, implants and grafts, initial encounter: Secondary | ICD-10-CM | POA: Diagnosis not present

## 2014-04-15 DIAGNOSIS — D631 Anemia in chronic kidney disease: Secondary | ICD-10-CM | POA: Diagnosis not present

## 2014-04-15 DIAGNOSIS — D509 Iron deficiency anemia, unspecified: Secondary | ICD-10-CM | POA: Diagnosis not present

## 2014-04-15 DIAGNOSIS — N039 Chronic nephritic syndrome with unspecified morphologic changes: Secondary | ICD-10-CM | POA: Diagnosis not present

## 2014-04-15 DIAGNOSIS — N186 End stage renal disease: Secondary | ICD-10-CM | POA: Diagnosis not present

## 2014-04-15 DIAGNOSIS — Z992 Dependence on renal dialysis: Secondary | ICD-10-CM | POA: Diagnosis not present

## 2014-04-17 DIAGNOSIS — N186 End stage renal disease: Secondary | ICD-10-CM | POA: Diagnosis not present

## 2014-04-17 DIAGNOSIS — D631 Anemia in chronic kidney disease: Secondary | ICD-10-CM | POA: Diagnosis not present

## 2014-04-17 DIAGNOSIS — Z992 Dependence on renal dialysis: Secondary | ICD-10-CM | POA: Diagnosis not present

## 2014-04-17 DIAGNOSIS — T82898A Other specified complication of vascular prosthetic devices, implants and grafts, initial encounter: Secondary | ICD-10-CM | POA: Diagnosis not present

## 2014-04-17 DIAGNOSIS — D509 Iron deficiency anemia, unspecified: Secondary | ICD-10-CM | POA: Diagnosis not present

## 2014-04-19 DIAGNOSIS — D509 Iron deficiency anemia, unspecified: Secondary | ICD-10-CM | POA: Diagnosis not present

## 2014-04-19 DIAGNOSIS — D631 Anemia in chronic kidney disease: Secondary | ICD-10-CM | POA: Diagnosis not present

## 2014-04-19 DIAGNOSIS — N186 End stage renal disease: Secondary | ICD-10-CM | POA: Diagnosis not present

## 2014-04-19 DIAGNOSIS — Z992 Dependence on renal dialysis: Secondary | ICD-10-CM | POA: Diagnosis not present

## 2014-04-19 DIAGNOSIS — N039 Chronic nephritic syndrome with unspecified morphologic changes: Secondary | ICD-10-CM | POA: Diagnosis not present

## 2014-04-19 DIAGNOSIS — T82898A Other specified complication of vascular prosthetic devices, implants and grafts, initial encounter: Secondary | ICD-10-CM | POA: Diagnosis not present

## 2014-04-22 DIAGNOSIS — Z992 Dependence on renal dialysis: Secondary | ICD-10-CM | POA: Diagnosis not present

## 2014-04-22 DIAGNOSIS — N186 End stage renal disease: Secondary | ICD-10-CM | POA: Diagnosis not present

## 2014-04-22 DIAGNOSIS — D509 Iron deficiency anemia, unspecified: Secondary | ICD-10-CM | POA: Diagnosis not present

## 2014-04-22 DIAGNOSIS — D631 Anemia in chronic kidney disease: Secondary | ICD-10-CM | POA: Diagnosis not present

## 2014-04-22 DIAGNOSIS — T82898A Other specified complication of vascular prosthetic devices, implants and grafts, initial encounter: Secondary | ICD-10-CM | POA: Diagnosis not present

## 2014-04-24 DIAGNOSIS — N186 End stage renal disease: Secondary | ICD-10-CM | POA: Diagnosis not present

## 2014-04-24 DIAGNOSIS — D631 Anemia in chronic kidney disease: Secondary | ICD-10-CM | POA: Diagnosis not present

## 2014-04-24 DIAGNOSIS — D509 Iron deficiency anemia, unspecified: Secondary | ICD-10-CM | POA: Diagnosis not present

## 2014-04-24 DIAGNOSIS — T82898A Other specified complication of vascular prosthetic devices, implants and grafts, initial encounter: Secondary | ICD-10-CM | POA: Diagnosis not present

## 2014-04-24 DIAGNOSIS — Z992 Dependence on renal dialysis: Secondary | ICD-10-CM | POA: Diagnosis not present

## 2014-04-25 ENCOUNTER — Other Ambulatory Visit: Payer: Self-pay | Admitting: *Deleted

## 2014-04-25 ENCOUNTER — Other Ambulatory Visit: Payer: Self-pay

## 2014-04-25 MED ORDER — ATORVASTATIN CALCIUM 20 MG PO TABS: ORAL_TABLET | ORAL | Status: DC

## 2014-04-25 MED ORDER — HYDRALAZINE HCL 50 MG PO TABS: ORAL_TABLET | ORAL | Status: DC

## 2014-04-25 MED ORDER — METOPROLOL TARTRATE 50 MG PO TABS: ORAL_TABLET | ORAL | Status: DC

## 2014-04-25 MED ORDER — AMLODIPINE BESYLATE 5 MG PO TABS: ORAL_TABLET | ORAL | Status: DC

## 2014-04-25 MED ORDER — ATORVASTATIN CALCIUM 20 MG PO TABS: ORAL_TABLET | ORAL | Status: AC

## 2014-04-25 NOTE — Telephone Encounter (Signed)
Refill sent for metoprolol tart 50 mg  

## 2014-04-25 NOTE — Telephone Encounter (Signed)
Refill sent for amlodipine.  

## 2014-04-25 NOTE — Telephone Encounter (Signed)
Requested Prescriptions   Signed Prescriptions Disp Refills  . amLODipine (NORVASC) 5 MG tablet 30 tablet 3    Sig: take 1 tablet by mouth once daily    Authorizing Provider: Minna Merritts    Ordering User: LOPEZ, MARINA C  . atorvastatin (LIPITOR) 20 MG tablet 30 tablet 3    Sig: take 1 tablet by mouth at bedtime    Authorizing Provider: Minna Merritts    Ordering User: Eugenio Hoes, MARINA C  . hydrALAZINE (APRESOLINE) 50 MG tablet 90 tablet 3    Sig: take 1 tablet by mouth every 8 hours    Authorizing Provider: Minna Merritts    Ordering User: LOPEZ, MARINA C  . metoprolol (LOPRESSOR) 50 MG tablet 60 tablet 3    Sig: take 1 tablet by mouth twice a day    Authorizing Provider: Minna Merritts    Ordering User: Britt Bottom

## 2014-04-25 NOTE — Telephone Encounter (Signed)
Refill sent for atorvastatin and hydralazine.

## 2014-04-26 DIAGNOSIS — D631 Anemia in chronic kidney disease: Secondary | ICD-10-CM | POA: Diagnosis not present

## 2014-04-26 DIAGNOSIS — T82898A Other specified complication of vascular prosthetic devices, implants and grafts, initial encounter: Secondary | ICD-10-CM | POA: Diagnosis not present

## 2014-04-26 DIAGNOSIS — Z992 Dependence on renal dialysis: Secondary | ICD-10-CM | POA: Diagnosis not present

## 2014-04-26 DIAGNOSIS — N186 End stage renal disease: Secondary | ICD-10-CM | POA: Diagnosis not present

## 2014-04-26 DIAGNOSIS — D509 Iron deficiency anemia, unspecified: Secondary | ICD-10-CM | POA: Diagnosis not present

## 2014-04-26 DIAGNOSIS — N039 Chronic nephritic syndrome with unspecified morphologic changes: Secondary | ICD-10-CM | POA: Diagnosis not present

## 2014-04-29 DIAGNOSIS — D509 Iron deficiency anemia, unspecified: Secondary | ICD-10-CM | POA: Diagnosis not present

## 2014-04-29 DIAGNOSIS — N039 Chronic nephritic syndrome with unspecified morphologic changes: Secondary | ICD-10-CM | POA: Diagnosis not present

## 2014-04-29 DIAGNOSIS — T82898A Other specified complication of vascular prosthetic devices, implants and grafts, initial encounter: Secondary | ICD-10-CM | POA: Diagnosis not present

## 2014-04-29 DIAGNOSIS — D631 Anemia in chronic kidney disease: Secondary | ICD-10-CM | POA: Diagnosis not present

## 2014-04-29 DIAGNOSIS — N186 End stage renal disease: Secondary | ICD-10-CM | POA: Diagnosis not present

## 2014-04-29 DIAGNOSIS — Z992 Dependence on renal dialysis: Secondary | ICD-10-CM | POA: Diagnosis not present

## 2014-05-01 DIAGNOSIS — T82898A Other specified complication of vascular prosthetic devices, implants and grafts, initial encounter: Secondary | ICD-10-CM | POA: Diagnosis not present

## 2014-05-01 DIAGNOSIS — D509 Iron deficiency anemia, unspecified: Secondary | ICD-10-CM | POA: Diagnosis not present

## 2014-05-01 DIAGNOSIS — N186 End stage renal disease: Secondary | ICD-10-CM | POA: Diagnosis not present

## 2014-05-01 DIAGNOSIS — N039 Chronic nephritic syndrome with unspecified morphologic changes: Secondary | ICD-10-CM | POA: Diagnosis not present

## 2014-05-01 DIAGNOSIS — Z992 Dependence on renal dialysis: Secondary | ICD-10-CM | POA: Diagnosis not present

## 2014-05-01 DIAGNOSIS — D631 Anemia in chronic kidney disease: Secondary | ICD-10-CM | POA: Diagnosis not present

## 2014-05-03 DIAGNOSIS — D631 Anemia in chronic kidney disease: Secondary | ICD-10-CM | POA: Diagnosis not present

## 2014-05-03 DIAGNOSIS — T82898A Other specified complication of vascular prosthetic devices, implants and grafts, initial encounter: Secondary | ICD-10-CM | POA: Diagnosis not present

## 2014-05-03 DIAGNOSIS — D509 Iron deficiency anemia, unspecified: Secondary | ICD-10-CM | POA: Diagnosis not present

## 2014-05-03 DIAGNOSIS — N039 Chronic nephritic syndrome with unspecified morphologic changes: Secondary | ICD-10-CM | POA: Diagnosis not present

## 2014-05-03 DIAGNOSIS — Z992 Dependence on renal dialysis: Secondary | ICD-10-CM | POA: Diagnosis not present

## 2014-05-03 DIAGNOSIS — N186 End stage renal disease: Secondary | ICD-10-CM | POA: Diagnosis not present

## 2014-05-06 DIAGNOSIS — N186 End stage renal disease: Secondary | ICD-10-CM | POA: Diagnosis not present

## 2014-05-06 DIAGNOSIS — Z992 Dependence on renal dialysis: Secondary | ICD-10-CM | POA: Diagnosis not present

## 2014-05-06 DIAGNOSIS — T82898A Other specified complication of vascular prosthetic devices, implants and grafts, initial encounter: Secondary | ICD-10-CM | POA: Diagnosis not present

## 2014-05-06 DIAGNOSIS — D509 Iron deficiency anemia, unspecified: Secondary | ICD-10-CM | POA: Diagnosis not present

## 2014-05-06 DIAGNOSIS — D631 Anemia in chronic kidney disease: Secondary | ICD-10-CM | POA: Diagnosis not present

## 2014-05-07 DIAGNOSIS — T82898A Other specified complication of vascular prosthetic devices, implants and grafts, initial encounter: Secondary | ICD-10-CM | POA: Diagnosis not present

## 2014-05-07 DIAGNOSIS — D509 Iron deficiency anemia, unspecified: Secondary | ICD-10-CM | POA: Diagnosis not present

## 2014-05-07 DIAGNOSIS — Z992 Dependence on renal dialysis: Secondary | ICD-10-CM | POA: Diagnosis not present

## 2014-05-07 DIAGNOSIS — N039 Chronic nephritic syndrome with unspecified morphologic changes: Secondary | ICD-10-CM | POA: Diagnosis not present

## 2014-05-07 DIAGNOSIS — D631 Anemia in chronic kidney disease: Secondary | ICD-10-CM | POA: Diagnosis not present

## 2014-05-07 DIAGNOSIS — N186 End stage renal disease: Secondary | ICD-10-CM | POA: Diagnosis not present

## 2014-05-08 DIAGNOSIS — N186 End stage renal disease: Secondary | ICD-10-CM | POA: Diagnosis not present

## 2014-05-08 DIAGNOSIS — Z992 Dependence on renal dialysis: Secondary | ICD-10-CM | POA: Diagnosis not present

## 2014-05-08 DIAGNOSIS — D631 Anemia in chronic kidney disease: Secondary | ICD-10-CM | POA: Diagnosis not present

## 2014-05-08 DIAGNOSIS — T82898A Other specified complication of vascular prosthetic devices, implants and grafts, initial encounter: Secondary | ICD-10-CM | POA: Diagnosis not present

## 2014-05-08 DIAGNOSIS — D509 Iron deficiency anemia, unspecified: Secondary | ICD-10-CM | POA: Diagnosis not present

## 2014-05-10 DIAGNOSIS — D631 Anemia in chronic kidney disease: Secondary | ICD-10-CM | POA: Diagnosis not present

## 2014-05-10 DIAGNOSIS — T82898A Other specified complication of vascular prosthetic devices, implants and grafts, initial encounter: Secondary | ICD-10-CM | POA: Diagnosis not present

## 2014-05-10 DIAGNOSIS — N186 End stage renal disease: Secondary | ICD-10-CM | POA: Diagnosis not present

## 2014-05-10 DIAGNOSIS — D509 Iron deficiency anemia, unspecified: Secondary | ICD-10-CM | POA: Diagnosis not present

## 2014-05-10 DIAGNOSIS — Z992 Dependence on renal dialysis: Secondary | ICD-10-CM | POA: Diagnosis not present

## 2014-05-13 DIAGNOSIS — Z992 Dependence on renal dialysis: Secondary | ICD-10-CM | POA: Diagnosis not present

## 2014-05-13 DIAGNOSIS — D631 Anemia in chronic kidney disease: Secondary | ICD-10-CM | POA: Diagnosis not present

## 2014-05-13 DIAGNOSIS — N186 End stage renal disease: Secondary | ICD-10-CM | POA: Diagnosis not present

## 2014-05-13 DIAGNOSIS — T82898A Other specified complication of vascular prosthetic devices, implants and grafts, initial encounter: Secondary | ICD-10-CM | POA: Diagnosis not present

## 2014-05-13 DIAGNOSIS — D509 Iron deficiency anemia, unspecified: Secondary | ICD-10-CM | POA: Diagnosis not present

## 2014-05-15 DIAGNOSIS — D509 Iron deficiency anemia, unspecified: Secondary | ICD-10-CM | POA: Diagnosis not present

## 2014-05-15 DIAGNOSIS — Z992 Dependence on renal dialysis: Secondary | ICD-10-CM | POA: Diagnosis not present

## 2014-05-15 DIAGNOSIS — N039 Chronic nephritic syndrome with unspecified morphologic changes: Secondary | ICD-10-CM | POA: Diagnosis not present

## 2014-05-15 DIAGNOSIS — N2581 Secondary hyperparathyroidism of renal origin: Secondary | ICD-10-CM | POA: Diagnosis not present

## 2014-05-15 DIAGNOSIS — N186 End stage renal disease: Secondary | ICD-10-CM | POA: Diagnosis not present

## 2014-05-15 DIAGNOSIS — Z23 Encounter for immunization: Secondary | ICD-10-CM | POA: Diagnosis not present

## 2014-05-15 DIAGNOSIS — D631 Anemia in chronic kidney disease: Secondary | ICD-10-CM | POA: Diagnosis not present

## 2014-05-23 ENCOUNTER — Ambulatory Visit: Payer: Self-pay | Admitting: Vascular Surgery

## 2014-05-23 DIAGNOSIS — Z4901 Encounter for fitting and adjustment of extracorporeal dialysis catheter: Secondary | ICD-10-CM | POA: Diagnosis not present

## 2014-05-23 DIAGNOSIS — Z992 Dependence on renal dialysis: Secondary | ICD-10-CM | POA: Diagnosis not present

## 2014-05-23 DIAGNOSIS — I120 Hypertensive chronic kidney disease with stage 5 chronic kidney disease or end stage renal disease: Secondary | ICD-10-CM | POA: Diagnosis not present

## 2014-05-23 DIAGNOSIS — N186 End stage renal disease: Secondary | ICD-10-CM | POA: Diagnosis not present

## 2014-05-28 ENCOUNTER — Encounter: Payer: Self-pay | Admitting: Cardiovascular Disease

## 2014-05-28 ENCOUNTER — Ambulatory Visit (INDEPENDENT_AMBULATORY_CARE_PROVIDER_SITE_OTHER): Payer: Medicare Other | Admitting: Cardiovascular Disease

## 2014-05-28 ENCOUNTER — Encounter: Payer: Self-pay | Admitting: *Deleted

## 2014-05-28 VITALS — BP 113/71 | HR 72 | Ht 67.0 in | Wt 142.5 lb

## 2014-05-28 DIAGNOSIS — Z951 Presence of aortocoronary bypass graft: Secondary | ICD-10-CM | POA: Diagnosis not present

## 2014-05-28 DIAGNOSIS — I2510 Atherosclerotic heart disease of native coronary artery without angina pectoris: Secondary | ICD-10-CM | POA: Diagnosis not present

## 2014-05-28 DIAGNOSIS — I5032 Chronic diastolic (congestive) heart failure: Secondary | ICD-10-CM | POA: Diagnosis not present

## 2014-05-28 DIAGNOSIS — E785 Hyperlipidemia, unspecified: Secondary | ICD-10-CM

## 2014-05-28 DIAGNOSIS — I10 Essential (primary) hypertension: Secondary | ICD-10-CM

## 2014-05-28 MED ORDER — ASPIRIN EC 81 MG PO TBEC: 81.0000 mg | DELAYED_RELEASE_TABLET | Freq: Every day | ORAL | Status: DC

## 2014-05-28 MED ORDER — HYDRALAZINE HCL 25 MG PO TABS: 25.0000 mg | ORAL_TABLET | Freq: Three times a day (TID) | ORAL | Status: DC

## 2014-05-28 NOTE — Assessment & Plan Note (Signed)
He is doing very well with no symptoms suggestive of angina. Continue medical therapy. I asked him to resume aspirin 81 mg once daily.

## 2014-05-28 NOTE — Patient Instructions (Signed)
Your physician has recommended you make the following change in your medication:  Start Aspirin 81 mg once daily  Decrease Hydralazine to 25 mg three times a day   Your physician recommends that you schedule a follow-up appointment in:  4 months

## 2014-05-28 NOTE — Assessment & Plan Note (Signed)
I decreased the dose of hydralazine to 25 mg 3 times daily. I suspect that if he continues to do this well, this can be discontinued in the near future.

## 2014-05-28 NOTE — Progress Notes (Signed)
HPI  This is a 53 year old Caucasian male who is here today for followup visit regarding coronary artery disease. He has prolonged history of tobacco use as well as previous excessive alcohol use.  He has end-stage renal disease on hemodialysis on Monday Wednesday Friday due to IgA nephropathy with nephrotic syndrome. Other conditions include hypertension and hyperlipidemia. He was started on dialysis in February and presented a few days later with severe dyspnea at rest. He was noted to be significantly hypertensive with a blood pressure of 216/124 on presentation. He was hypoxic with evidence of pulmonary edema. He reported gradual decline in functional capacity. He also noticed dark stools. He was noted to be anemic with a hemoglobin of 8.6. Troponin was elevated at 1.5 with BNP greater than 100,000. Echocardiogram showed normal LV systolic function with mild mitral regurgitation and moderate tricuspid regurgitation. He improved significantly with blood pressure control and fluid removal during dialysis. He underwent blood transfusion. Cardiac catheterization showed significant three-vessel coronary artery disease. He underwent EGD and colonoscopy by Dr.Wohl which showed multiple colon polyps which were resected. A total of 14 polyps were removed and were benign.  He underwent CABG on March 19. He had postoperative fibrillation which was controlled with amiodarone.  He has been doing extremely well and denies any chest pain [facility] shortness of breath. He lost 18 pounds since his last visit. He did notice episodes of dizziness. Blood pressure seems to be on the low side.   No Known Allergies   Current Outpatient Prescriptions on File Prior to Visit  Medication Sig Dispense Refill  . amLODipine (NORVASC) 5 MG tablet take 1 tablet by mouth once daily  30 tablet  6  . atorvastatin (LIPITOR) 20 MG tablet take 1 tablet by mouth at bedtime  30 tablet  6  . hydrALAZINE (APRESOLINE) 50 MG tablet take 1  tablet by mouth every 8 hours  90 tablet  3  . metoprolol (LOPRESSOR) 50 MG tablet take 1 tablet by mouth twice a day  60 tablet  6  . oxyCODONE (OXY IR/ROXICODONE) 5 MG immediate release tablet Take 1-2 tablets (5-10 mg total) by mouth every 3 (three) hours as needed for severe pain.  30 tablet  0  . traMADol (ULTRAM) 50 MG tablet Take 1 tablet (50 mg total) by mouth every 6 (six) hours as needed.  40 tablet  0  . zolpidem (AMBIEN) 5 MG tablet Take 1 tablet (5 mg total) by mouth at bedtime as needed for sleep.  30 tablet  0   No current facility-administered medications on file prior to visit.     Past Medical History  Diagnosis Date  . CHF (congestive heart failure)   . COPD (chronic obstructive pulmonary disease)   . ETOH abuse   . Tobacco abuse   . Non-ST elevation MI (NSTEMI)   . Colon polyps   . Anemia   . Tobacco abuse   . Hyperlipidemia   . Hypertension   . Lower GI bleed     Due to colon polyps. Status post resection of 14 polyps  . Shortness of breath   . Pneumonia     hx  . End stage renal disease     m-w-f   -[facility]  . IgA nephropathy   . GERD (gastroesophageal reflux disease)     hx  . Arthritis   . Coronary artery disease     Non-ST elevation myocardial infarction in February of 2015. In the setting of  hypertensive urgency and blood loss anemia. Cardiac catheterization showed significant three-vessel coronary artery disease. EF was 55% by echo. He underwent CABG at [facility] on May 19 with LIMA to LAD, Sequential SVG to OM1 and OM2, and SVG to RCA     Past Surgical History  Procedure Laterality Date  . Cardiac catheterization      RCA 90% and calcified mid LAD 80% Stenosis  . Renal biopsy Left 14  . Dialysis catheterr  2/15  . Coronary artery bypass graft N/A 11/29/2013    Procedure: CORONARY ARTERY BYPASS GRAFTING (CABG) x 4 using endoscopically harvested right saphenous vein and left internal mammary artery;  Surgeon: [name], MD;  Location: [facility];  Service: Open Heart Surgery;  Laterality: N/A;  . Intraoperative transesophageal echocardiogram N/A 11/29/2013    Procedure: INTRAOPERATIVE TRANSESOPHAGEAL ECHOCARDIOGRAM;  Surgeon: [name], MD;  Location: [facility];  Service: Open Heart Surgery;  Laterality: N/A;     Family History  Problem Relation Age of Onset  . Heart disease Father   . Heart disease Brother      History   Social History  . Marital Status: Single    Spouse Name: N/A    Number of Children: N/A  . Years of Education: N/A   Occupational History  . Not on file.   Social History Main Topics  . Smoking status: Current Every Day Smoker -- 0.50 packs/day for 30 years    Types: Cigarettes  . Smokeless tobacco: Never Used  . Alcohol Use: 3.6 oz/week    6 Cans of beer per week     Comment: daily less now  . Drug Use: No  . Sexual Activity: Not on file   Other Topics Concern  . Not on file   Social History Narrative  . No narrative on file    PHYSICAL EXAM   BP 113/71  Pulse 72  Ht 5\' 7"  (1.702 m)  Wt 142 lb 8 oz (64.638 kg)  BMI 22.31 kg/m2 Constitutional: He is oriented to person, place, and time. He appears well-developed and well-nourished. No distress.  HENT: No nasal discharge.  Head: Normocephalic and atraumatic.  Eyes: Pupils are equal and round. Right eye exhibits no discharge. Left eye exhibits no discharge.  Neck: Normal range of motion. Neck supple. No JVD present. No thyromegaly present.  Cardiovascular: Normal rate, regular rhythm, normal heart sounds and. Exam reveals no gallop and no friction rub. No murmur heard.  Pulmonary/Chest: Effort normal and breath sounds normal. No stridor. No respiratory distress. He has no wheezes. He has no rales. He exhibits no tenderness.  Abdominal: Soft. Bowel sounds are normal. He exhibits no distension. There is no tenderness. There is no rebound and no guarding.  Musculoskeletal: Normal range of motion. He exhibits trace edema .    Neurological: He is alert and oriented to person, place, and time. Coordination normal.  Skin: Skin is warm and dry. Possible spider angiomas noted on the chest. He is not diaphoretic. No erythema. No pallor.  Psychiatric: He has a normal mood and affect. His behavior is normal. Judgment and thought content normal.  Right groin is intact with no hematoma [facility] tenderness   EKG: Sinus  Rhythm  - frequent ectopic ventricular beat s  # VECs = 2 -Left atrial enlargement.   Voltage criteria for LVH  (S(V1)+R(V5) exceeds 4.00 mV).   BORDERLINE

## 2014-05-28 NOTE — Assessment & Plan Note (Signed)
Continue treatment with atorvastatin with a target LDL of less than 70. 

## 2014-05-28 NOTE — Assessment & Plan Note (Signed)
He appears to be euvolemic and has lost 18 pounds since last visit.

## 2014-06-03 DIAGNOSIS — H00019 Hordeolum externum unspecified eye, unspecified eyelid: Secondary | ICD-10-CM | POA: Diagnosis not present

## 2014-06-12 DIAGNOSIS — N186 End stage renal disease: Secondary | ICD-10-CM | POA: Diagnosis not present

## 2014-06-12 DIAGNOSIS — Z992 Dependence on renal dialysis: Secondary | ICD-10-CM | POA: Diagnosis not present

## 2014-06-13 DIAGNOSIS — N186 End stage renal disease: Secondary | ICD-10-CM | POA: Diagnosis not present

## 2014-06-13 DIAGNOSIS — Z992 Dependence on renal dialysis: Secondary | ICD-10-CM | POA: Diagnosis not present

## 2014-06-14 DIAGNOSIS — N186 End stage renal disease: Secondary | ICD-10-CM | POA: Diagnosis not present

## 2014-06-14 DIAGNOSIS — D509 Iron deficiency anemia, unspecified: Secondary | ICD-10-CM | POA: Diagnosis not present

## 2014-06-14 DIAGNOSIS — Z23 Encounter for immunization: Secondary | ICD-10-CM | POA: Diagnosis not present

## 2014-06-14 DIAGNOSIS — Z992 Dependence on renal dialysis: Secondary | ICD-10-CM | POA: Diagnosis not present

## 2014-06-17 DIAGNOSIS — T82898D Other specified complication of vascular prosthetic devices, implants and grafts, subsequent encounter: Secondary | ICD-10-CM | POA: Diagnosis not present

## 2014-06-17 DIAGNOSIS — N186 End stage renal disease: Secondary | ICD-10-CM | POA: Diagnosis not present

## 2014-06-17 DIAGNOSIS — Z23 Encounter for immunization: Secondary | ICD-10-CM | POA: Diagnosis not present

## 2014-06-17 DIAGNOSIS — D509 Iron deficiency anemia, unspecified: Secondary | ICD-10-CM | POA: Diagnosis not present

## 2014-06-17 DIAGNOSIS — Z992 Dependence on renal dialysis: Secondary | ICD-10-CM | POA: Diagnosis not present

## 2014-06-19 DIAGNOSIS — D509 Iron deficiency anemia, unspecified: Secondary | ICD-10-CM | POA: Diagnosis not present

## 2014-06-19 DIAGNOSIS — N186 End stage renal disease: Secondary | ICD-10-CM | POA: Diagnosis not present

## 2014-06-19 DIAGNOSIS — Z23 Encounter for immunization: Secondary | ICD-10-CM | POA: Diagnosis not present

## 2014-06-19 DIAGNOSIS — Z992 Dependence on renal dialysis: Secondary | ICD-10-CM | POA: Diagnosis not present

## 2014-06-21 DIAGNOSIS — Z23 Encounter for immunization: Secondary | ICD-10-CM | POA: Diagnosis not present

## 2014-06-21 DIAGNOSIS — N186 End stage renal disease: Secondary | ICD-10-CM | POA: Diagnosis not present

## 2014-06-21 DIAGNOSIS — D509 Iron deficiency anemia, unspecified: Secondary | ICD-10-CM | POA: Diagnosis not present

## 2014-06-21 DIAGNOSIS — Z992 Dependence on renal dialysis: Secondary | ICD-10-CM | POA: Diagnosis not present

## 2014-06-24 ENCOUNTER — Emergency Department: Payer: Self-pay | Admitting: Emergency Medicine

## 2014-06-24 DIAGNOSIS — M7989 Other specified soft tissue disorders: Secondary | ICD-10-CM | POA: Diagnosis not present

## 2014-06-24 DIAGNOSIS — Z992 Dependence on renal dialysis: Secondary | ICD-10-CM | POA: Diagnosis not present

## 2014-06-24 DIAGNOSIS — R110 Nausea: Secondary | ICD-10-CM | POA: Diagnosis not present

## 2014-06-24 DIAGNOSIS — Z87891 Personal history of nicotine dependence: Secondary | ICD-10-CM | POA: Diagnosis not present

## 2014-06-24 DIAGNOSIS — G8918 Other acute postprocedural pain: Secondary | ICD-10-CM | POA: Diagnosis not present

## 2014-06-24 DIAGNOSIS — Z79899 Other long term (current) drug therapy: Secondary | ICD-10-CM | POA: Diagnosis not present

## 2014-06-24 DIAGNOSIS — M79632 Pain in left forearm: Secondary | ICD-10-CM | POA: Diagnosis not present

## 2014-06-24 LAB — CBC WITH DIFFERENTIAL/PLATELET
Basophil #: 0.1 10*3/uL (ref 0.0–0.1)
Basophil %: 1.2 %
EOS PCT: 0.4 %
Eosinophil #: 0 10*3/uL (ref 0.0–0.7)
HCT: 43.9 % (ref 40.0–52.0)
HGB: 14.1 g/dL (ref 13.0–18.0)
Lymphocyte #: 0.9 10*3/uL — ABNORMAL LOW (ref 1.0–3.6)
Lymphocyte %: 10.3 %
MCH: 35.3 pg — AB (ref 26.0–34.0)
MCHC: 32.1 g/dL (ref 32.0–36.0)
MCV: 110 fL — ABNORMAL HIGH (ref 80–100)
Monocyte #: 1.1 x10 3/mm — ABNORMAL HIGH (ref 0.2–1.0)
Monocyte %: 12.4 %
Neutrophil #: 6.5 10*3/uL (ref 1.4–6.5)
Neutrophil %: 75.7 %
PLATELETS: 219 10*3/uL (ref 150–440)
RBC: 3.99 10*6/uL — AB (ref 4.40–5.90)
RDW: 15.2 % — AB (ref 11.5–14.5)
WBC: 8.6 10*3/uL (ref 3.8–10.6)

## 2014-06-24 LAB — COMPREHENSIVE METABOLIC PANEL
ALT: 10 U/L — AB
Albumin: 3 g/dL — ABNORMAL LOW (ref 3.4–5.0)
Alkaline Phosphatase: 119 U/L — ABNORMAL HIGH
Anion Gap: 15 (ref 7–16)
BUN: 60 mg/dL — ABNORMAL HIGH (ref 7–18)
Bilirubin,Total: 0.5 mg/dL (ref 0.2–1.0)
CALCIUM: 8.8 mg/dL (ref 8.5–10.1)
CO2: 21 mmol/L (ref 21–32)
CREATININE: 9.1 mg/dL — AB (ref 0.60–1.30)
Chloride: 96 mmol/L — ABNORMAL LOW (ref 98–107)
GFR CALC AF AMER: 8 — AB
GFR CALC NON AF AMER: 7 — AB
GLUCOSE: 92 mg/dL (ref 65–99)
Osmolality: 281 (ref 275–301)
Potassium: 4.9 mmol/L (ref 3.5–5.1)
SGOT(AST): 14 U/L — ABNORMAL LOW (ref 15–37)
Sodium: 132 mmol/L — ABNORMAL LOW (ref 136–145)
TOTAL PROTEIN: 7.8 g/dL (ref 6.4–8.2)

## 2014-06-25 DIAGNOSIS — D509 Iron deficiency anemia, unspecified: Secondary | ICD-10-CM | POA: Diagnosis not present

## 2014-06-25 DIAGNOSIS — N186 End stage renal disease: Secondary | ICD-10-CM | POA: Diagnosis not present

## 2014-06-25 DIAGNOSIS — Z992 Dependence on renal dialysis: Secondary | ICD-10-CM | POA: Diagnosis not present

## 2014-06-25 DIAGNOSIS — Z23 Encounter for immunization: Secondary | ICD-10-CM | POA: Diagnosis not present

## 2014-06-26 DIAGNOSIS — Z23 Encounter for immunization: Secondary | ICD-10-CM | POA: Diagnosis not present

## 2014-06-26 DIAGNOSIS — D509 Iron deficiency anemia, unspecified: Secondary | ICD-10-CM | POA: Diagnosis not present

## 2014-06-26 DIAGNOSIS — N186 End stage renal disease: Secondary | ICD-10-CM | POA: Diagnosis not present

## 2014-06-26 DIAGNOSIS — Z992 Dependence on renal dialysis: Secondary | ICD-10-CM | POA: Diagnosis not present

## 2014-06-28 DIAGNOSIS — D509 Iron deficiency anemia, unspecified: Secondary | ICD-10-CM | POA: Diagnosis not present

## 2014-06-28 DIAGNOSIS — Z23 Encounter for immunization: Secondary | ICD-10-CM | POA: Diagnosis not present

## 2014-06-28 DIAGNOSIS — Z992 Dependence on renal dialysis: Secondary | ICD-10-CM | POA: Diagnosis not present

## 2014-06-28 DIAGNOSIS — N186 End stage renal disease: Secondary | ICD-10-CM | POA: Diagnosis not present

## 2014-06-29 LAB — CULTURE, BLOOD (SINGLE)

## 2014-07-01 DIAGNOSIS — D509 Iron deficiency anemia, unspecified: Secondary | ICD-10-CM | POA: Diagnosis not present

## 2014-07-01 DIAGNOSIS — Z23 Encounter for immunization: Secondary | ICD-10-CM | POA: Diagnosis not present

## 2014-07-01 DIAGNOSIS — Z992 Dependence on renal dialysis: Secondary | ICD-10-CM | POA: Diagnosis not present

## 2014-07-01 DIAGNOSIS — N186 End stage renal disease: Secondary | ICD-10-CM | POA: Diagnosis not present

## 2014-07-02 DIAGNOSIS — Y841 Kidney dialysis as the cause of abnormal reaction of the patient, or of later complication, without mention of misadventure at the time of the procedure: Secondary | ICD-10-CM | POA: Diagnosis not present

## 2014-07-02 DIAGNOSIS — N186 End stage renal disease: Secondary | ICD-10-CM | POA: Diagnosis not present

## 2014-07-03 DIAGNOSIS — N186 End stage renal disease: Secondary | ICD-10-CM | POA: Diagnosis not present

## 2014-07-03 DIAGNOSIS — D509 Iron deficiency anemia, unspecified: Secondary | ICD-10-CM | POA: Diagnosis not present

## 2014-07-03 DIAGNOSIS — Z23 Encounter for immunization: Secondary | ICD-10-CM | POA: Diagnosis not present

## 2014-07-03 DIAGNOSIS — Z992 Dependence on renal dialysis: Secondary | ICD-10-CM | POA: Diagnosis not present

## 2014-07-04 ENCOUNTER — Ambulatory Visit: Payer: Self-pay | Admitting: Vascular Surgery

## 2014-07-04 DIAGNOSIS — T82510A Breakdown (mechanical) of surgically created arteriovenous fistula, initial encounter: Secondary | ICD-10-CM | POA: Diagnosis not present

## 2014-07-04 DIAGNOSIS — I2510 Atherosclerotic heart disease of native coronary artery without angina pectoris: Secondary | ICD-10-CM | POA: Diagnosis not present

## 2014-07-04 DIAGNOSIS — Z992 Dependence on renal dialysis: Secondary | ICD-10-CM | POA: Diagnosis not present

## 2014-07-04 DIAGNOSIS — Z79899 Other long term (current) drug therapy: Secondary | ICD-10-CM | POA: Diagnosis not present

## 2014-07-04 DIAGNOSIS — N186 End stage renal disease: Secondary | ICD-10-CM | POA: Diagnosis not present

## 2014-07-04 DIAGNOSIS — I120 Hypertensive chronic kidney disease with stage 5 chronic kidney disease or end stage renal disease: Secondary | ICD-10-CM | POA: Diagnosis not present

## 2014-07-04 DIAGNOSIS — Z9889 Other specified postprocedural states: Secondary | ICD-10-CM | POA: Diagnosis not present

## 2014-07-04 DIAGNOSIS — T82898A Other specified complication of vascular prosthetic devices, implants and grafts, initial encounter: Secondary | ICD-10-CM | POA: Diagnosis not present

## 2014-07-05 DIAGNOSIS — Z23 Encounter for immunization: Secondary | ICD-10-CM | POA: Diagnosis not present

## 2014-07-05 DIAGNOSIS — N186 End stage renal disease: Secondary | ICD-10-CM | POA: Diagnosis not present

## 2014-07-05 DIAGNOSIS — Z992 Dependence on renal dialysis: Secondary | ICD-10-CM | POA: Diagnosis not present

## 2014-07-05 DIAGNOSIS — D509 Iron deficiency anemia, unspecified: Secondary | ICD-10-CM | POA: Diagnosis not present

## 2014-07-08 DIAGNOSIS — Z992 Dependence on renal dialysis: Secondary | ICD-10-CM | POA: Diagnosis not present

## 2014-07-08 DIAGNOSIS — D509 Iron deficiency anemia, unspecified: Secondary | ICD-10-CM | POA: Diagnosis not present

## 2014-07-08 DIAGNOSIS — N186 End stage renal disease: Secondary | ICD-10-CM | POA: Diagnosis not present

## 2014-07-08 DIAGNOSIS — Z23 Encounter for immunization: Secondary | ICD-10-CM | POA: Diagnosis not present

## 2014-07-10 DIAGNOSIS — Z23 Encounter for immunization: Secondary | ICD-10-CM | POA: Diagnosis not present

## 2014-07-10 DIAGNOSIS — N186 End stage renal disease: Secondary | ICD-10-CM | POA: Diagnosis not present

## 2014-07-10 DIAGNOSIS — D509 Iron deficiency anemia, unspecified: Secondary | ICD-10-CM | POA: Diagnosis not present

## 2014-07-10 DIAGNOSIS — Z992 Dependence on renal dialysis: Secondary | ICD-10-CM | POA: Diagnosis not present

## 2014-07-11 ENCOUNTER — Ambulatory Visit: Payer: Self-pay | Admitting: Vascular Surgery

## 2014-07-11 DIAGNOSIS — Z01812 Encounter for preprocedural laboratory examination: Secondary | ICD-10-CM | POA: Diagnosis not present

## 2014-07-11 DIAGNOSIS — Z992 Dependence on renal dialysis: Secondary | ICD-10-CM | POA: Diagnosis not present

## 2014-07-11 DIAGNOSIS — N186 End stage renal disease: Secondary | ICD-10-CM | POA: Diagnosis not present

## 2014-07-11 DIAGNOSIS — I1311 Hypertensive heart and chronic kidney disease without heart failure, with stage 5 chronic kidney disease, or end stage renal disease: Secondary | ICD-10-CM | POA: Diagnosis not present

## 2014-07-11 DIAGNOSIS — I509 Heart failure, unspecified: Secondary | ICD-10-CM | POA: Diagnosis not present

## 2014-07-11 DIAGNOSIS — T829XXA Unspecified complication of cardiac and vascular prosthetic device, implant and graft, initial encounter: Secondary | ICD-10-CM | POA: Diagnosis not present

## 2014-07-11 LAB — CBC WITH DIFFERENTIAL/PLATELET
BASOS ABS: 0.1 10*3/uL (ref 0.0–0.1)
BASOS PCT: 0.8 %
Eosinophil #: 0.2 10*3/uL (ref 0.0–0.7)
Eosinophil %: 2.2 %
HCT: 42.2 % (ref 40.0–52.0)
HGB: 13.7 g/dL (ref 13.0–18.0)
LYMPHS PCT: 25 %
Lymphocyte #: 2.1 10*3/uL (ref 1.0–3.6)
MCH: 35.4 pg — ABNORMAL HIGH (ref 26.0–34.0)
MCHC: 32.5 g/dL (ref 32.0–36.0)
MCV: 109 fL — ABNORMAL HIGH (ref 80–100)
MONO ABS: 1.6 x10 3/mm — AB (ref 0.2–1.0)
Monocyte %: 18.6 %
NEUTROS ABS: 4.5 10*3/uL (ref 1.4–6.5)
NEUTROS PCT: 53.4 %
PLATELETS: 223 10*3/uL (ref 150–440)
RBC: 3.88 10*6/uL — ABNORMAL LOW (ref 4.40–5.90)
RDW: 14.9 % — AB (ref 11.5–14.5)
WBC: 8.4 10*3/uL (ref 3.8–10.6)

## 2014-07-11 LAB — POTASSIUM: Potassium: 4.8 mmol/L (ref 3.5–5.1)

## 2014-07-12 DIAGNOSIS — Z23 Encounter for immunization: Secondary | ICD-10-CM | POA: Diagnosis not present

## 2014-07-12 DIAGNOSIS — Z992 Dependence on renal dialysis: Secondary | ICD-10-CM | POA: Diagnosis not present

## 2014-07-12 DIAGNOSIS — D509 Iron deficiency anemia, unspecified: Secondary | ICD-10-CM | POA: Diagnosis not present

## 2014-07-12 DIAGNOSIS — N186 End stage renal disease: Secondary | ICD-10-CM | POA: Diagnosis not present

## 2014-07-14 DIAGNOSIS — Z992 Dependence on renal dialysis: Secondary | ICD-10-CM | POA: Diagnosis not present

## 2014-07-14 DIAGNOSIS — N186 End stage renal disease: Secondary | ICD-10-CM | POA: Diagnosis not present

## 2014-07-15 DIAGNOSIS — D509 Iron deficiency anemia, unspecified: Secondary | ICD-10-CM | POA: Diagnosis not present

## 2014-07-15 DIAGNOSIS — N186 End stage renal disease: Secondary | ICD-10-CM | POA: Diagnosis not present

## 2014-07-15 DIAGNOSIS — Z992 Dependence on renal dialysis: Secondary | ICD-10-CM | POA: Diagnosis not present

## 2014-07-16 DIAGNOSIS — H2513 Age-related nuclear cataract, bilateral: Secondary | ICD-10-CM | POA: Diagnosis not present

## 2014-07-17 DIAGNOSIS — D509 Iron deficiency anemia, unspecified: Secondary | ICD-10-CM | POA: Diagnosis not present

## 2014-07-17 DIAGNOSIS — N186 End stage renal disease: Secondary | ICD-10-CM | POA: Diagnosis not present

## 2014-07-17 DIAGNOSIS — Z992 Dependence on renal dialysis: Secondary | ICD-10-CM | POA: Diagnosis not present

## 2014-07-17 DIAGNOSIS — H25041 Posterior subcapsular polar age-related cataract, right eye: Secondary | ICD-10-CM | POA: Diagnosis not present

## 2014-07-18 ENCOUNTER — Ambulatory Visit: Payer: Self-pay | Admitting: Vascular Surgery

## 2014-07-18 DIAGNOSIS — Z7982 Long term (current) use of aspirin: Secondary | ICD-10-CM | POA: Diagnosis not present

## 2014-07-18 DIAGNOSIS — G2581 Restless legs syndrome: Secondary | ICD-10-CM | POA: Diagnosis not present

## 2014-07-18 DIAGNOSIS — T82898A Other specified complication of vascular prosthetic devices, implants and grafts, initial encounter: Secondary | ICD-10-CM | POA: Diagnosis not present

## 2014-07-18 DIAGNOSIS — N186 End stage renal disease: Secondary | ICD-10-CM | POA: Diagnosis not present

## 2014-07-18 DIAGNOSIS — F17200 Nicotine dependence, unspecified, uncomplicated: Secondary | ICD-10-CM | POA: Diagnosis not present

## 2014-07-18 DIAGNOSIS — Z992 Dependence on renal dialysis: Secondary | ICD-10-CM | POA: Diagnosis not present

## 2014-07-18 DIAGNOSIS — K30 Functional dyspepsia: Secondary | ICD-10-CM | POA: Diagnosis not present

## 2014-07-18 DIAGNOSIS — M1990 Unspecified osteoarthritis, unspecified site: Secondary | ICD-10-CM | POA: Diagnosis not present

## 2014-07-18 DIAGNOSIS — I120 Hypertensive chronic kidney disease with stage 5 chronic kidney disease or end stage renal disease: Secondary | ICD-10-CM | POA: Diagnosis not present

## 2014-07-18 DIAGNOSIS — Z8489 Family history of other specified conditions: Secondary | ICD-10-CM | POA: Diagnosis not present

## 2014-07-18 DIAGNOSIS — I2510 Atherosclerotic heart disease of native coronary artery without angina pectoris: Secondary | ICD-10-CM | POA: Diagnosis not present

## 2014-07-18 DIAGNOSIS — R42 Dizziness and giddiness: Secondary | ICD-10-CM | POA: Diagnosis not present

## 2014-07-18 DIAGNOSIS — I252 Old myocardial infarction: Secondary | ICD-10-CM | POA: Diagnosis not present

## 2014-07-18 DIAGNOSIS — Z951 Presence of aortocoronary bypass graft: Secondary | ICD-10-CM | POA: Diagnosis not present

## 2014-07-18 DIAGNOSIS — Z79899 Other long term (current) drug therapy: Secondary | ICD-10-CM | POA: Diagnosis not present

## 2014-07-18 DIAGNOSIS — K219 Gastro-esophageal reflux disease without esophagitis: Secondary | ICD-10-CM | POA: Diagnosis not present

## 2014-07-19 DIAGNOSIS — Z992 Dependence on renal dialysis: Secondary | ICD-10-CM | POA: Diagnosis not present

## 2014-07-19 DIAGNOSIS — N186 End stage renal disease: Secondary | ICD-10-CM | POA: Diagnosis not present

## 2014-07-19 DIAGNOSIS — D509 Iron deficiency anemia, unspecified: Secondary | ICD-10-CM | POA: Diagnosis not present

## 2014-07-22 DIAGNOSIS — N186 End stage renal disease: Secondary | ICD-10-CM | POA: Diagnosis not present

## 2014-07-22 DIAGNOSIS — D509 Iron deficiency anemia, unspecified: Secondary | ICD-10-CM | POA: Diagnosis not present

## 2014-07-22 DIAGNOSIS — Z992 Dependence on renal dialysis: Secondary | ICD-10-CM | POA: Diagnosis not present

## 2014-07-24 DIAGNOSIS — Z992 Dependence on renal dialysis: Secondary | ICD-10-CM | POA: Diagnosis not present

## 2014-07-24 DIAGNOSIS — D509 Iron deficiency anemia, unspecified: Secondary | ICD-10-CM | POA: Diagnosis not present

## 2014-07-24 DIAGNOSIS — N186 End stage renal disease: Secondary | ICD-10-CM | POA: Diagnosis not present

## 2014-07-26 DIAGNOSIS — N186 End stage renal disease: Secondary | ICD-10-CM | POA: Diagnosis not present

## 2014-07-26 DIAGNOSIS — D509 Iron deficiency anemia, unspecified: Secondary | ICD-10-CM | POA: Diagnosis not present

## 2014-07-26 DIAGNOSIS — Z992 Dependence on renal dialysis: Secondary | ICD-10-CM | POA: Diagnosis not present

## 2014-07-29 DIAGNOSIS — D509 Iron deficiency anemia, unspecified: Secondary | ICD-10-CM | POA: Diagnosis not present

## 2014-07-29 DIAGNOSIS — Z992 Dependence on renal dialysis: Secondary | ICD-10-CM | POA: Diagnosis not present

## 2014-07-29 DIAGNOSIS — N186 End stage renal disease: Secondary | ICD-10-CM | POA: Diagnosis not present

## 2014-07-31 DIAGNOSIS — D509 Iron deficiency anemia, unspecified: Secondary | ICD-10-CM | POA: Diagnosis not present

## 2014-07-31 DIAGNOSIS — Z992 Dependence on renal dialysis: Secondary | ICD-10-CM | POA: Diagnosis not present

## 2014-07-31 DIAGNOSIS — N186 End stage renal disease: Secondary | ICD-10-CM | POA: Diagnosis not present

## 2014-08-02 DIAGNOSIS — D509 Iron deficiency anemia, unspecified: Secondary | ICD-10-CM | POA: Diagnosis not present

## 2014-08-02 DIAGNOSIS — N186 End stage renal disease: Secondary | ICD-10-CM | POA: Diagnosis not present

## 2014-08-02 DIAGNOSIS — Z992 Dependence on renal dialysis: Secondary | ICD-10-CM | POA: Diagnosis not present

## 2014-08-04 DIAGNOSIS — N186 End stage renal disease: Secondary | ICD-10-CM | POA: Diagnosis not present

## 2014-08-04 DIAGNOSIS — Z992 Dependence on renal dialysis: Secondary | ICD-10-CM | POA: Diagnosis not present

## 2014-08-04 DIAGNOSIS — D509 Iron deficiency anemia, unspecified: Secondary | ICD-10-CM | POA: Diagnosis not present

## 2014-08-05 DIAGNOSIS — Z992 Dependence on renal dialysis: Secondary | ICD-10-CM | POA: Diagnosis not present

## 2014-08-05 DIAGNOSIS — D509 Iron deficiency anemia, unspecified: Secondary | ICD-10-CM | POA: Diagnosis not present

## 2014-08-05 DIAGNOSIS — N186 End stage renal disease: Secondary | ICD-10-CM | POA: Diagnosis not present

## 2014-08-06 DIAGNOSIS — H25041 Posterior subcapsular polar age-related cataract, right eye: Secondary | ICD-10-CM | POA: Diagnosis not present

## 2014-08-06 DIAGNOSIS — N186 End stage renal disease: Secondary | ICD-10-CM | POA: Diagnosis not present

## 2014-08-06 DIAGNOSIS — D509 Iron deficiency anemia, unspecified: Secondary | ICD-10-CM | POA: Diagnosis not present

## 2014-08-06 DIAGNOSIS — Z992 Dependence on renal dialysis: Secondary | ICD-10-CM | POA: Diagnosis not present

## 2014-08-09 DIAGNOSIS — Z992 Dependence on renal dialysis: Secondary | ICD-10-CM | POA: Diagnosis not present

## 2014-08-09 DIAGNOSIS — N186 End stage renal disease: Secondary | ICD-10-CM | POA: Diagnosis not present

## 2014-08-09 DIAGNOSIS — D509 Iron deficiency anemia, unspecified: Secondary | ICD-10-CM | POA: Diagnosis not present

## 2014-08-12 DIAGNOSIS — D509 Iron deficiency anemia, unspecified: Secondary | ICD-10-CM | POA: Diagnosis not present

## 2014-08-12 DIAGNOSIS — N186 End stage renal disease: Secondary | ICD-10-CM | POA: Diagnosis not present

## 2014-08-12 DIAGNOSIS — Z992 Dependence on renal dialysis: Secondary | ICD-10-CM | POA: Diagnosis not present

## 2014-08-13 DIAGNOSIS — Z992 Dependence on renal dialysis: Secondary | ICD-10-CM | POA: Diagnosis not present

## 2014-08-13 DIAGNOSIS — N186 End stage renal disease: Secondary | ICD-10-CM | POA: Diagnosis not present

## 2014-08-14 DIAGNOSIS — Z992 Dependence on renal dialysis: Secondary | ICD-10-CM | POA: Diagnosis not present

## 2014-08-14 DIAGNOSIS — D631 Anemia in chronic kidney disease: Secondary | ICD-10-CM | POA: Diagnosis not present

## 2014-08-14 DIAGNOSIS — D509 Iron deficiency anemia, unspecified: Secondary | ICD-10-CM | POA: Diagnosis not present

## 2014-08-14 DIAGNOSIS — N186 End stage renal disease: Secondary | ICD-10-CM | POA: Diagnosis not present

## 2014-08-14 DIAGNOSIS — N2581 Secondary hyperparathyroidism of renal origin: Secondary | ICD-10-CM | POA: Diagnosis not present

## 2014-08-15 ENCOUNTER — Telehealth: Payer: Self-pay | Admitting: *Deleted

## 2014-08-15 NOTE — Telephone Encounter (Signed)
Clearance faxed as requested

## 2014-08-15 NOTE — Telephone Encounter (Signed)
Cindy with [facility] called looking for cardiac clearance. Patient is having surgery on 08/19/14 (caract surgery). Faxed on 08/06/14 Please send stat.

## 2014-08-16 DIAGNOSIS — N186 End stage renal disease: Secondary | ICD-10-CM | POA: Diagnosis not present

## 2014-08-16 DIAGNOSIS — N2581 Secondary hyperparathyroidism of renal origin: Secondary | ICD-10-CM | POA: Diagnosis not present

## 2014-08-16 DIAGNOSIS — Z992 Dependence on renal dialysis: Secondary | ICD-10-CM | POA: Diagnosis not present

## 2014-08-16 DIAGNOSIS — D509 Iron deficiency anemia, unspecified: Secondary | ICD-10-CM | POA: Diagnosis not present

## 2014-08-16 DIAGNOSIS — D631 Anemia in chronic kidney disease: Secondary | ICD-10-CM | POA: Diagnosis not present

## 2014-08-19 ENCOUNTER — Ambulatory Visit: Payer: Self-pay | Admitting: Ophthalmology

## 2014-08-19 DIAGNOSIS — Z992 Dependence on renal dialysis: Secondary | ICD-10-CM | POA: Diagnosis not present

## 2014-08-19 DIAGNOSIS — H2511 Age-related nuclear cataract, right eye: Secondary | ICD-10-CM | POA: Diagnosis not present

## 2014-08-19 DIAGNOSIS — I499 Cardiac arrhythmia, unspecified: Secondary | ICD-10-CM | POA: Diagnosis not present

## 2014-08-19 DIAGNOSIS — E780 Pure hypercholesterolemia: Secondary | ICD-10-CM | POA: Diagnosis not present

## 2014-08-19 DIAGNOSIS — N2581 Secondary hyperparathyroidism of renal origin: Secondary | ICD-10-CM | POA: Diagnosis not present

## 2014-08-19 DIAGNOSIS — N186 End stage renal disease: Secondary | ICD-10-CM | POA: Diagnosis not present

## 2014-08-19 DIAGNOSIS — N19 Unspecified kidney failure: Secondary | ICD-10-CM | POA: Diagnosis not present

## 2014-08-19 DIAGNOSIS — D631 Anemia in chronic kidney disease: Secondary | ICD-10-CM | POA: Diagnosis not present

## 2014-08-19 DIAGNOSIS — D509 Iron deficiency anemia, unspecified: Secondary | ICD-10-CM | POA: Diagnosis not present

## 2014-08-19 DIAGNOSIS — I252 Old myocardial infarction: Secondary | ICD-10-CM | POA: Diagnosis not present

## 2014-08-19 DIAGNOSIS — I10 Essential (primary) hypertension: Secondary | ICD-10-CM | POA: Diagnosis not present

## 2014-08-19 DIAGNOSIS — M1990 Unspecified osteoarthritis, unspecified site: Secondary | ICD-10-CM | POA: Diagnosis not present

## 2014-08-19 DIAGNOSIS — I2510 Atherosclerotic heart disease of native coronary artery without angina pectoris: Secondary | ICD-10-CM | POA: Diagnosis not present

## 2014-08-19 DIAGNOSIS — H25041 Posterior subcapsular polar age-related cataract, right eye: Secondary | ICD-10-CM | POA: Diagnosis not present

## 2014-08-21 DIAGNOSIS — D509 Iron deficiency anemia, unspecified: Secondary | ICD-10-CM | POA: Diagnosis not present

## 2014-08-21 DIAGNOSIS — Z992 Dependence on renal dialysis: Secondary | ICD-10-CM | POA: Diagnosis not present

## 2014-08-21 DIAGNOSIS — N186 End stage renal disease: Secondary | ICD-10-CM | POA: Diagnosis not present

## 2014-08-21 DIAGNOSIS — N2581 Secondary hyperparathyroidism of renal origin: Secondary | ICD-10-CM | POA: Diagnosis not present

## 2014-08-21 DIAGNOSIS — D631 Anemia in chronic kidney disease: Secondary | ICD-10-CM | POA: Diagnosis not present

## 2014-08-23 DIAGNOSIS — Z992 Dependence on renal dialysis: Secondary | ICD-10-CM | POA: Diagnosis not present

## 2014-08-23 DIAGNOSIS — D631 Anemia in chronic kidney disease: Secondary | ICD-10-CM | POA: Diagnosis not present

## 2014-08-23 DIAGNOSIS — D509 Iron deficiency anemia, unspecified: Secondary | ICD-10-CM | POA: Diagnosis not present

## 2014-08-23 DIAGNOSIS — N2581 Secondary hyperparathyroidism of renal origin: Secondary | ICD-10-CM | POA: Diagnosis not present

## 2014-08-23 DIAGNOSIS — N186 End stage renal disease: Secondary | ICD-10-CM | POA: Diagnosis not present

## 2014-08-26 DIAGNOSIS — N2581 Secondary hyperparathyroidism of renal origin: Secondary | ICD-10-CM | POA: Diagnosis not present

## 2014-08-26 DIAGNOSIS — N186 End stage renal disease: Secondary | ICD-10-CM | POA: Diagnosis not present

## 2014-08-26 DIAGNOSIS — Z992 Dependence on renal dialysis: Secondary | ICD-10-CM | POA: Diagnosis not present

## 2014-08-26 DIAGNOSIS — D509 Iron deficiency anemia, unspecified: Secondary | ICD-10-CM | POA: Diagnosis not present

## 2014-08-26 DIAGNOSIS — D631 Anemia in chronic kidney disease: Secondary | ICD-10-CM | POA: Diagnosis not present

## 2014-08-28 DIAGNOSIS — Z992 Dependence on renal dialysis: Secondary | ICD-10-CM | POA: Diagnosis not present

## 2014-08-28 DIAGNOSIS — N186 End stage renal disease: Secondary | ICD-10-CM | POA: Diagnosis not present

## 2014-08-28 DIAGNOSIS — D631 Anemia in chronic kidney disease: Secondary | ICD-10-CM | POA: Diagnosis not present

## 2014-08-28 DIAGNOSIS — D509 Iron deficiency anemia, unspecified: Secondary | ICD-10-CM | POA: Diagnosis not present

## 2014-08-28 DIAGNOSIS — N2581 Secondary hyperparathyroidism of renal origin: Secondary | ICD-10-CM | POA: Diagnosis not present

## 2014-08-30 DIAGNOSIS — D631 Anemia in chronic kidney disease: Secondary | ICD-10-CM | POA: Diagnosis not present

## 2014-08-30 DIAGNOSIS — Z992 Dependence on renal dialysis: Secondary | ICD-10-CM | POA: Diagnosis not present

## 2014-08-30 DIAGNOSIS — N186 End stage renal disease: Secondary | ICD-10-CM | POA: Diagnosis not present

## 2014-08-30 DIAGNOSIS — N2581 Secondary hyperparathyroidism of renal origin: Secondary | ICD-10-CM | POA: Diagnosis not present

## 2014-08-30 DIAGNOSIS — D509 Iron deficiency anemia, unspecified: Secondary | ICD-10-CM | POA: Diagnosis not present

## 2014-09-01 ENCOUNTER — Other Ambulatory Visit: Payer: Self-pay | Admitting: Cardiovascular Disease

## 2014-09-02 DIAGNOSIS — D631 Anemia in chronic kidney disease: Secondary | ICD-10-CM | POA: Diagnosis not present

## 2014-09-02 DIAGNOSIS — D509 Iron deficiency anemia, unspecified: Secondary | ICD-10-CM | POA: Diagnosis not present

## 2014-09-02 DIAGNOSIS — N186 End stage renal disease: Secondary | ICD-10-CM | POA: Diagnosis not present

## 2014-09-02 DIAGNOSIS — Z992 Dependence on renal dialysis: Secondary | ICD-10-CM | POA: Diagnosis not present

## 2014-09-02 DIAGNOSIS — N2581 Secondary hyperparathyroidism of renal origin: Secondary | ICD-10-CM | POA: Diagnosis not present

## 2014-09-04 DIAGNOSIS — N2581 Secondary hyperparathyroidism of renal origin: Secondary | ICD-10-CM | POA: Diagnosis not present

## 2014-09-04 DIAGNOSIS — D631 Anemia in chronic kidney disease: Secondary | ICD-10-CM | POA: Diagnosis not present

## 2014-09-04 DIAGNOSIS — N186 End stage renal disease: Secondary | ICD-10-CM | POA: Diagnosis not present

## 2014-09-04 DIAGNOSIS — D509 Iron deficiency anemia, unspecified: Secondary | ICD-10-CM | POA: Diagnosis not present

## 2014-09-04 DIAGNOSIS — Z992 Dependence on renal dialysis: Secondary | ICD-10-CM | POA: Diagnosis not present

## 2014-09-07 DIAGNOSIS — D509 Iron deficiency anemia, unspecified: Secondary | ICD-10-CM | POA: Diagnosis not present

## 2014-09-07 DIAGNOSIS — N186 End stage renal disease: Secondary | ICD-10-CM | POA: Diagnosis not present

## 2014-09-07 DIAGNOSIS — Z992 Dependence on renal dialysis: Secondary | ICD-10-CM | POA: Diagnosis not present

## 2014-09-07 DIAGNOSIS — N2581 Secondary hyperparathyroidism of renal origin: Secondary | ICD-10-CM | POA: Diagnosis not present

## 2014-09-07 DIAGNOSIS — D631 Anemia in chronic kidney disease: Secondary | ICD-10-CM | POA: Diagnosis not present

## 2014-09-09 DIAGNOSIS — D509 Iron deficiency anemia, unspecified: Secondary | ICD-10-CM | POA: Diagnosis not present

## 2014-09-09 DIAGNOSIS — D631 Anemia in chronic kidney disease: Secondary | ICD-10-CM | POA: Diagnosis not present

## 2014-09-09 DIAGNOSIS — Z992 Dependence on renal dialysis: Secondary | ICD-10-CM | POA: Diagnosis not present

## 2014-09-09 DIAGNOSIS — N186 End stage renal disease: Secondary | ICD-10-CM | POA: Diagnosis not present

## 2014-09-09 DIAGNOSIS — N2581 Secondary hyperparathyroidism of renal origin: Secondary | ICD-10-CM | POA: Diagnosis not present

## 2014-09-11 DIAGNOSIS — Z992 Dependence on renal dialysis: Secondary | ICD-10-CM | POA: Diagnosis not present

## 2014-09-11 DIAGNOSIS — N2581 Secondary hyperparathyroidism of renal origin: Secondary | ICD-10-CM | POA: Diagnosis not present

## 2014-09-11 DIAGNOSIS — D631 Anemia in chronic kidney disease: Secondary | ICD-10-CM | POA: Diagnosis not present

## 2014-09-11 DIAGNOSIS — D509 Iron deficiency anemia, unspecified: Secondary | ICD-10-CM | POA: Diagnosis not present

## 2014-09-11 DIAGNOSIS — N186 End stage renal disease: Secondary | ICD-10-CM | POA: Diagnosis not present

## 2014-09-13 DIAGNOSIS — D509 Iron deficiency anemia, unspecified: Secondary | ICD-10-CM | POA: Diagnosis not present

## 2014-09-13 DIAGNOSIS — Z992 Dependence on renal dialysis: Secondary | ICD-10-CM | POA: Diagnosis not present

## 2014-09-13 DIAGNOSIS — N186 End stage renal disease: Secondary | ICD-10-CM | POA: Diagnosis not present

## 2014-09-13 DIAGNOSIS — I214 Non-ST elevation (NSTEMI) myocardial infarction: Secondary | ICD-10-CM

## 2014-09-13 DIAGNOSIS — D631 Anemia in chronic kidney disease: Secondary | ICD-10-CM | POA: Diagnosis not present

## 2014-09-13 HISTORY — DX: Non-ST elevation (NSTEMI) myocardial infarction: I21.4

## 2014-09-16 DIAGNOSIS — N186 End stage renal disease: Secondary | ICD-10-CM | POA: Diagnosis not present

## 2014-09-16 DIAGNOSIS — D509 Iron deficiency anemia, unspecified: Secondary | ICD-10-CM | POA: Diagnosis not present

## 2014-09-16 DIAGNOSIS — D631 Anemia in chronic kidney disease: Secondary | ICD-10-CM | POA: Diagnosis not present

## 2014-09-16 DIAGNOSIS — Z992 Dependence on renal dialysis: Secondary | ICD-10-CM | POA: Diagnosis not present

## 2014-09-18 DIAGNOSIS — Z992 Dependence on renal dialysis: Secondary | ICD-10-CM | POA: Diagnosis not present

## 2014-09-18 DIAGNOSIS — N186 End stage renal disease: Secondary | ICD-10-CM | POA: Diagnosis not present

## 2014-09-18 DIAGNOSIS — D509 Iron deficiency anemia, unspecified: Secondary | ICD-10-CM | POA: Diagnosis not present

## 2014-09-18 DIAGNOSIS — D631 Anemia in chronic kidney disease: Secondary | ICD-10-CM | POA: Diagnosis not present

## 2014-09-19 ENCOUNTER — Telehealth: Payer: Self-pay | Admitting: Cardiovascular Disease

## 2014-09-19 NOTE — Telephone Encounter (Signed)
He is low risk. He should continue Aspirin 81 mg daily. Avoid epinephrine.

## 2014-09-19 NOTE — Telephone Encounter (Signed)
PAtient needs clearance and instructions for having teeth pulled

## 2014-09-19 NOTE — Telephone Encounter (Signed)
Pt called back with the fax number for his dentist. See previous phone note  [phone]

## 2014-09-19 NOTE — Telephone Encounter (Signed)
Pt called stating he need [place] to write a letter for his dentist so he can go see him.    1. What dental office are you calling from?   [facility] 2. What is your office phone and fax number?  [phone] 3. . What type of procedure is the patient having performed?  Needs some teeth pulled 4. What date is procedure scheduled?  Wasn't sure 5. What is your question (ex. Antibiotics prior to procedure, holding medication-we need to know how long dentist wants pt to hold med)?  He needs a letter so he can get teeth pulled.

## 2014-09-19 NOTE — Telephone Encounter (Signed)
Informed patient of Dr. Jacklynn Ganong response  Patient verbalized understanding

## 2014-09-20 ENCOUNTER — Encounter: Payer: Self-pay | Admitting: *Deleted

## 2014-09-20 DIAGNOSIS — D509 Iron deficiency anemia, unspecified: Secondary | ICD-10-CM | POA: Diagnosis not present

## 2014-09-20 DIAGNOSIS — D631 Anemia in chronic kidney disease: Secondary | ICD-10-CM | POA: Diagnosis not present

## 2014-09-20 DIAGNOSIS — N186 End stage renal disease: Secondary | ICD-10-CM | POA: Diagnosis not present

## 2014-09-20 DIAGNOSIS — Z992 Dependence on renal dialysis: Secondary | ICD-10-CM | POA: Diagnosis not present

## 2014-09-20 NOTE — Telephone Encounter (Signed)
Clearance faxed as requested Patient aware

## 2014-09-23 DIAGNOSIS — N186 End stage renal disease: Secondary | ICD-10-CM | POA: Diagnosis not present

## 2014-09-23 DIAGNOSIS — D509 Iron deficiency anemia, unspecified: Secondary | ICD-10-CM | POA: Diagnosis not present

## 2014-09-23 DIAGNOSIS — Z992 Dependence on renal dialysis: Secondary | ICD-10-CM | POA: Diagnosis not present

## 2014-09-23 DIAGNOSIS — D631 Anemia in chronic kidney disease: Secondary | ICD-10-CM | POA: Diagnosis not present

## 2014-09-25 DIAGNOSIS — D631 Anemia in chronic kidney disease: Secondary | ICD-10-CM | POA: Diagnosis not present

## 2014-09-25 DIAGNOSIS — N186 End stage renal disease: Secondary | ICD-10-CM | POA: Diagnosis not present

## 2014-09-25 DIAGNOSIS — D509 Iron deficiency anemia, unspecified: Secondary | ICD-10-CM | POA: Diagnosis not present

## 2014-09-25 DIAGNOSIS — Z992 Dependence on renal dialysis: Secondary | ICD-10-CM | POA: Diagnosis not present

## 2014-09-27 DIAGNOSIS — N186 End stage renal disease: Secondary | ICD-10-CM | POA: Diagnosis not present

## 2014-09-27 DIAGNOSIS — D509 Iron deficiency anemia, unspecified: Secondary | ICD-10-CM | POA: Diagnosis not present

## 2014-09-27 DIAGNOSIS — D631 Anemia in chronic kidney disease: Secondary | ICD-10-CM | POA: Diagnosis not present

## 2014-09-27 DIAGNOSIS — Z992 Dependence on renal dialysis: Secondary | ICD-10-CM | POA: Diagnosis not present

## 2014-09-30 DIAGNOSIS — Z992 Dependence on renal dialysis: Secondary | ICD-10-CM | POA: Diagnosis not present

## 2014-09-30 DIAGNOSIS — D509 Iron deficiency anemia, unspecified: Secondary | ICD-10-CM | POA: Diagnosis not present

## 2014-09-30 DIAGNOSIS — N186 End stage renal disease: Secondary | ICD-10-CM | POA: Diagnosis not present

## 2014-09-30 DIAGNOSIS — D631 Anemia in chronic kidney disease: Secondary | ICD-10-CM | POA: Diagnosis not present

## 2014-10-02 DIAGNOSIS — Z992 Dependence on renal dialysis: Secondary | ICD-10-CM | POA: Diagnosis not present

## 2014-10-02 DIAGNOSIS — D509 Iron deficiency anemia, unspecified: Secondary | ICD-10-CM | POA: Diagnosis not present

## 2014-10-02 DIAGNOSIS — N186 End stage renal disease: Secondary | ICD-10-CM | POA: Diagnosis not present

## 2014-10-02 DIAGNOSIS — D631 Anemia in chronic kidney disease: Secondary | ICD-10-CM | POA: Diagnosis not present

## 2014-10-04 DIAGNOSIS — D509 Iron deficiency anemia, unspecified: Secondary | ICD-10-CM | POA: Diagnosis not present

## 2014-10-04 DIAGNOSIS — N186 End stage renal disease: Secondary | ICD-10-CM | POA: Diagnosis not present

## 2014-10-04 DIAGNOSIS — Z992 Dependence on renal dialysis: Secondary | ICD-10-CM | POA: Diagnosis not present

## 2014-10-04 DIAGNOSIS — D631 Anemia in chronic kidney disease: Secondary | ICD-10-CM | POA: Diagnosis not present

## 2014-10-07 DIAGNOSIS — Z992 Dependence on renal dialysis: Secondary | ICD-10-CM | POA: Diagnosis not present

## 2014-10-07 DIAGNOSIS — D631 Anemia in chronic kidney disease: Secondary | ICD-10-CM | POA: Diagnosis not present

## 2014-10-07 DIAGNOSIS — D509 Iron deficiency anemia, unspecified: Secondary | ICD-10-CM | POA: Diagnosis not present

## 2014-10-07 DIAGNOSIS — N186 End stage renal disease: Secondary | ICD-10-CM | POA: Diagnosis not present

## 2014-10-09 DIAGNOSIS — D509 Iron deficiency anemia, unspecified: Secondary | ICD-10-CM | POA: Diagnosis not present

## 2014-10-09 DIAGNOSIS — N186 End stage renal disease: Secondary | ICD-10-CM | POA: Diagnosis not present

## 2014-10-09 DIAGNOSIS — D631 Anemia in chronic kidney disease: Secondary | ICD-10-CM | POA: Diagnosis not present

## 2014-10-09 DIAGNOSIS — Z992 Dependence on renal dialysis: Secondary | ICD-10-CM | POA: Diagnosis not present

## 2014-10-11 DIAGNOSIS — N186 End stage renal disease: Secondary | ICD-10-CM | POA: Diagnosis not present

## 2014-10-11 DIAGNOSIS — D631 Anemia in chronic kidney disease: Secondary | ICD-10-CM | POA: Diagnosis not present

## 2014-10-11 DIAGNOSIS — D509 Iron deficiency anemia, unspecified: Secondary | ICD-10-CM | POA: Diagnosis not present

## 2014-10-11 DIAGNOSIS — Z992 Dependence on renal dialysis: Secondary | ICD-10-CM | POA: Diagnosis not present

## 2014-10-14 DIAGNOSIS — N186 End stage renal disease: Secondary | ICD-10-CM | POA: Diagnosis not present

## 2014-10-14 DIAGNOSIS — D631 Anemia in chronic kidney disease: Secondary | ICD-10-CM | POA: Diagnosis not present

## 2014-10-14 DIAGNOSIS — Z992 Dependence on renal dialysis: Secondary | ICD-10-CM | POA: Diagnosis not present

## 2014-10-14 DIAGNOSIS — D509 Iron deficiency anemia, unspecified: Secondary | ICD-10-CM | POA: Diagnosis not present

## 2014-10-22 DIAGNOSIS — Y841 Kidney dialysis as the cause of abnormal reaction of the patient, or of later complication, without mention of misadventure at the time of the procedure: Secondary | ICD-10-CM | POA: Diagnosis not present

## 2014-10-22 DIAGNOSIS — E785 Hyperlipidemia, unspecified: Secondary | ICD-10-CM | POA: Diagnosis not present

## 2014-10-22 DIAGNOSIS — Z992 Dependence on renal dialysis: Secondary | ICD-10-CM | POA: Diagnosis not present

## 2014-10-22 DIAGNOSIS — F17200 Nicotine dependence, unspecified, uncomplicated: Secondary | ICD-10-CM | POA: Diagnosis not present

## 2014-10-22 DIAGNOSIS — N186 End stage renal disease: Secondary | ICD-10-CM | POA: Diagnosis not present

## 2014-10-22 DIAGNOSIS — I10 Essential (primary) hypertension: Secondary | ICD-10-CM | POA: Diagnosis not present

## 2014-11-05 DIAGNOSIS — I2510 Atherosclerotic heart disease of native coronary artery without angina pectoris: Secondary | ICD-10-CM | POA: Insufficient documentation

## 2014-11-05 DIAGNOSIS — N028 Recurrent and persistent hematuria with other morphologic changes: Secondary | ICD-10-CM | POA: Insufficient documentation

## 2014-11-12 DIAGNOSIS — N186 End stage renal disease: Secondary | ICD-10-CM | POA: Diagnosis not present

## 2014-11-12 DIAGNOSIS — Z992 Dependence on renal dialysis: Secondary | ICD-10-CM | POA: Diagnosis not present

## 2014-11-13 DIAGNOSIS — Z992 Dependence on renal dialysis: Secondary | ICD-10-CM | POA: Diagnosis not present

## 2014-11-13 DIAGNOSIS — N186 End stage renal disease: Secondary | ICD-10-CM | POA: Diagnosis not present

## 2014-11-13 DIAGNOSIS — D509 Iron deficiency anemia, unspecified: Secondary | ICD-10-CM | POA: Diagnosis not present

## 2014-11-14 ENCOUNTER — Ambulatory Visit: Payer: Medicare Other | Admitting: Cardiovascular Disease

## 2014-11-15 DIAGNOSIS — D509 Iron deficiency anemia, unspecified: Secondary | ICD-10-CM | POA: Diagnosis not present

## 2014-11-15 DIAGNOSIS — Z992 Dependence on renal dialysis: Secondary | ICD-10-CM | POA: Diagnosis not present

## 2014-11-15 DIAGNOSIS — N186 End stage renal disease: Secondary | ICD-10-CM | POA: Diagnosis not present

## 2014-11-18 DIAGNOSIS — N186 End stage renal disease: Secondary | ICD-10-CM | POA: Diagnosis not present

## 2014-11-18 DIAGNOSIS — Z992 Dependence on renal dialysis: Secondary | ICD-10-CM | POA: Diagnosis not present

## 2014-11-18 DIAGNOSIS — D509 Iron deficiency anemia, unspecified: Secondary | ICD-10-CM | POA: Diagnosis not present

## 2014-11-19 ENCOUNTER — Ambulatory Visit (INDEPENDENT_AMBULATORY_CARE_PROVIDER_SITE_OTHER): Payer: Medicare Other | Admitting: Cardiovascular Disease

## 2014-11-19 ENCOUNTER — Telehealth: Payer: Self-pay | Admitting: *Deleted

## 2014-11-19 ENCOUNTER — Encounter: Payer: Self-pay | Admitting: Cardiovascular Disease

## 2014-11-19 VITALS — BP 110/82 | HR 71 | Ht 67.0 in | Wt 147.0 lb

## 2014-11-19 DIAGNOSIS — R0600 Dyspnea, unspecified: Secondary | ICD-10-CM

## 2014-11-19 DIAGNOSIS — Z01810 Encounter for preprocedural cardiovascular examination: Secondary | ICD-10-CM | POA: Diagnosis not present

## 2014-11-19 DIAGNOSIS — I10 Essential (primary) hypertension: Secondary | ICD-10-CM | POA: Diagnosis not present

## 2014-11-19 DIAGNOSIS — I2510 Atherosclerotic heart disease of native coronary artery without angina pectoris: Secondary | ICD-10-CM

## 2014-11-19 NOTE — Assessment & Plan Note (Signed)
Continue treatment with atorvastatin with a target LDL of less than 70. 

## 2014-11-19 NOTE — Patient Instructions (Addendum)
[facility]  Your caregiver has ordered a Stress Test with nuclear imaging. The purpose of this test is to evaluate the blood supply to your heart muscle. This procedure is referred to as a "Non-Invasive Stress Test." This is because other than having an IV started in your vein, nothing is inserted or "invades" your body. Cardiac stress tests are done to find areas of poor blood flow to the heart by determining the extent of coronary artery disease (CAD). Some patients exercise on a treadmill, which naturally increases the blood flow to your heart, while others who are  unable to walk on a treadmill due to physical limitations have a pharmacologic/chemical stress agent called Lexiscan . This medicine will mimic walking on a treadmill by temporarily increasing your coronary blood flow.   Please note: these test may take anywhere between 2-4 hours to complete  PLEASE REPORT TO [facility] AT THE FIRST DESK WILL DIRECT YOU WHERE TO GO  Date of Procedure:___________3/15/16__________________________  Arrival Time for Procedure:________1015 am______________________   PLEASE NOTIFY THE OFFICE AT LEAST 24 HOURS IN ADVANCE IF YOU ARE UNABLE TO KEEP YOUR APPOINTMENT.  [phone] AND  PLEASE NOTIFY NUCLEAR MEDICINE AT [facility] AT LEAST 24 HOURS IN ADVANCE IF YOU ARE UNABLE TO KEEP YOUR APPOINTMENT. [phone]  How to prepare for your Myoview test:  1. Do not eat or drink after midnight 2. No caffeine for 24 hours prior to test 3. No smoking 24 hours prior to test. 4. Your medication may be taken with water.  If your doctor stopped a medication because of this test, do not take that medication. 5. Ladies, please do not wear dresses.  Skirts or pants are appropriate. Please wear a short sleeve shirt. 6. No perfume, cologne or lotion. 7. Wear comfortable walking shoes. No heels!  Your physician has requested that you have an echocardiogram. Echocardiography is a painless  test that uses sound waves to create images of your heart. It provides your doctor with information about the size and shape of your heart and how well your heart's chambers and valves are working. This procedure takes approximately one hour. There are no restrictions for this procedure.  Your physician wants you to follow-up in: 6 months. You will receive a reminder letter in the mail two months in advance. If you don't receive a letter, please call our office to schedule the follow-up appointment.

## 2014-11-19 NOTE — Assessment & Plan Note (Signed)
He is doing very well overall after CABG last year. He has no clear symptoms of angina. Continue medical therapy.

## 2014-11-19 NOTE — Assessment & Plan Note (Signed)
The patient is being evaluated for a kidney transplant. I recommend cardiac evaluation with a pharmacologic nuclear stress test and an echocardiogram. He is not able to exercise on a treadmill due to poor balance.

## 2014-11-19 NOTE — Progress Notes (Signed)
HPI  This is a 54 year old Caucasian male who is here today for followup visit regarding coronary artery disease. He has prolonged history of tobacco use as well as previous excessive alcohol use.  He has end-stage renal disease on hemodialysis on Monday Wednesday Friday due to IgA nephropathy with nephrotic syndrome. Other conditions include hypertension and hyperlipidemia. He was started on dialysis in February, 2015 and presented a few days later with severe dyspnea at rest. He was noted to be significantly hypertensive with a blood pressure of 216/124 on presentation. He was hypoxic with evidence of pulmonary edema. He reported gradual decline in functional capacity. He also noticed dark stools. He was noted to be anemic with a hemoglobin of 8.6. Troponin was elevated at 1.5 with BNP greater than 100,000. Echocardiogram showed normal LV systolic function with mild mitral regurgitation and moderate tricuspid regurgitation. He improved significantly with blood pressure control and fluid removal during dialysis. He underwent blood transfusion. Cardiac catheterization showed significant three-vessel coronary artery disease. He underwent EGD and colonoscopy by Dr.Wohl which showed multiple colon polyps which were resected. A total of 14 polyps were removed and were benign.  He underwent CABG on March 19. He had postoperative fibrillation which was controlled with amiodarone.  He has been doing extremely well and denies any chest pain or shortness of breath. Hydralazine was weaned off due to low blood pressure. He has been doing reasonably well and denies any chest pain. He has stable exertional dyspnea that he has been able to perform activities of daily living. He goes fishing frequently. He is being evaluated for kidney transplant.  No Known Allergies   Current Outpatient Prescriptions on File Prior to Visit  Medication Sig Dispense Refill  . amLODipine (NORVASC) 5 MG tablet take 1 tablet by  mouth once daily 30 tablet 6  . aspirin EC 81 MG tablet Take 1 tablet (81 mg total) by mouth daily. 90 tablet 3  . atorvastatin (LIPITOR) 20 MG tablet take 1 tablet by mouth at bedtime 30 tablet 6  . hydrALAZINE (APRESOLINE) 25 MG tablet take 1 tablet by mouth three times a day 90 tablet 3  . metoprolol (LOPRESSOR) 50 MG tablet take 1 tablet by mouth twice a day 60 tablet 6  . traMADol (ULTRAM) 50 MG tablet Take 1 tablet (50 mg total) by mouth every 6 (six) hours as needed. 40 tablet 0  . zolpidem (AMBIEN) 5 MG tablet Take 1 tablet (5 mg total) by mouth at bedtime as needed for sleep. 30 tablet 0   No current facility-administered medications on file prior to visit.     Past Medical History  Diagnosis Date  . CHF (congestive heart failure)   . COPD (chronic obstructive pulmonary disease)   . ETOH abuse   . Tobacco abuse   . Non-ST elevation MI (NSTEMI)   . Colon polyps   . Anemia   . Tobacco abuse   . Hyperlipidemia   . Hypertension   . Lower GI bleed     Due to colon polyps. Status post resection of 14 polyps  . Shortness of breath   . Pneumonia     hx  . End stage renal disease     [facility]-f   -[facility]  . IgA nephropathy   . GERD (gastroesophageal reflux disease)     hx  . Arthritis   . Coronary artery disease     Non-ST elevation myocardial infarction in February of 2015. In the setting of  hypertensive urgency and blood loss anemia. Cardiac catheterization showed significant three-vessel coronary artery disease. EF was 55% by echo. He underwent CABG at [facility] on May 19 with LIMA to LAD, Sequential SVG to OM1 and OM2, and SVG to RCA     Past Surgical History  Procedure Laterality Date  . Cardiac catheterization      RCA 90% and calcified mid LAD 80% Stenosis  . Renal biopsy Left 14  . Dialysis catheterr  2/15  . Coronary artery bypass graft N/A 11/29/2013    Procedure: CORONARY ARTERY BYPASS GRAFTING (CABG) x 4 using endoscopically harvested right saphenous vein  and left internal mammary artery;  Surgeon: [name], MD;  Location: [facility];  Service: Open Heart Surgery;  Laterality: N/A;  . Intraoperative transesophageal echocardiogram N/A 11/29/2013    Procedure: INTRAOPERATIVE TRANSESOPHAGEAL ECHOCARDIOGRAM;  Surgeon: [name], MD;  Location: [facility] OR;  Service: Open Heart Surgery;  Laterality: N/A;  . Av fistula placement       Family History  Problem Relation Age of Onset  . Heart disease Father   . Heart disease Brother      History   Social History  . Marital Status: Single    Spouse Name: N/A  . Number of Children: N/A  . Years of Education: N/A   Occupational History  . Not on file.   Social History Main Topics  . Smoking status: Current Every Day Smoker -- 0.50 packs/day for 30 years    Types: Cigarettes  . Smokeless tobacco: Never Used  . Alcohol Use: 3.6 oz/week    6 Cans of beer per week     Comment: daily less now  . Drug Use: No  . Sexual Activity: Not on file   Other Topics Concern  . Not on file   Social History Narrative    PHYSICAL EXAM   BP 110/82 mmHg  Pulse 71  Ht 5\' 7"  (1.702 m)  Wt 147 lb (66.679 kg)  BMI 23.02 kg/m2 Constitutional: He is oriented to person, place, and time. He appears well-developed and well-nourished. No distress.  HENT: No nasal discharge.  Head: Normocephalic and atraumatic.  Eyes: Pupils are equal and round. Right eye exhibits no discharge. Left eye exhibits no discharge.  Neck: Normal range of motion. Neck supple. No JVD present. No thyromegaly present.  Cardiovascular: Normal rate, regular rhythm, normal heart sounds and. Exam reveals no gallop and no friction rub. No murmur heard.  Pulmonary/Chest: Effort normal and breath sounds normal. No stridor. No respiratory distress. He has no wheezes. He has no rales. He exhibits no tenderness.  Abdominal: Soft. Bowel sounds are normal. He exhibits no distension. There is no tenderness. There is no rebound and no guarding.    Musculoskeletal: Normal range of motion. He exhibits trace edema .  Neurological: He is alert and oriented to person, place, and time. Coordination normal.  Skin: Skin is warm and dry. Possible spider angiomas noted on the chest. He is not diaphoretic. No erythema. No pallor.  Psychiatric: He has a normal mood and affect. His behavior is normal. Judgment and thought content normal.     EKG: Sinus  Rhythm  -Left atrial enlargement.   Possible left ventricular hypertrophy on non-voltage basis.   BORDERLINE

## 2014-11-19 NOTE — Assessment & Plan Note (Signed)
Hydralazine was discontinued due to low blood pressure. Blood pressure is well controlled currently.

## 2014-11-19 NOTE — Assessment & Plan Note (Signed)
He appears to be euvolemic. 

## 2014-11-19 NOTE — Telephone Encounter (Signed)
Megan from [facility] transplant wanted to ask if we could do a few things on patient.  Pt is going to be having kidney transplant and they would like [place] to do an echo and stress test on pt.  They did not specify what kind of stress test, as long as it got done.  Also they need a cardiac clearance on pt so he can do the transplant.   Please call if we are not able to do these.

## 2014-11-19 NOTE — Telephone Encounter (Signed)
PAtient scheduled during appt

## 2014-11-20 DIAGNOSIS — D509 Iron deficiency anemia, unspecified: Secondary | ICD-10-CM | POA: Diagnosis not present

## 2014-11-20 DIAGNOSIS — N186 End stage renal disease: Secondary | ICD-10-CM | POA: Diagnosis not present

## 2014-11-20 DIAGNOSIS — Z992 Dependence on renal dialysis: Secondary | ICD-10-CM | POA: Diagnosis not present

## 2014-11-22 DIAGNOSIS — N186 End stage renal disease: Secondary | ICD-10-CM | POA: Diagnosis not present

## 2014-11-22 DIAGNOSIS — Z992 Dependence on renal dialysis: Secondary | ICD-10-CM | POA: Diagnosis not present

## 2014-11-22 DIAGNOSIS — D509 Iron deficiency anemia, unspecified: Secondary | ICD-10-CM | POA: Diagnosis not present

## 2014-11-25 DIAGNOSIS — Z992 Dependence on renal dialysis: Secondary | ICD-10-CM | POA: Diagnosis not present

## 2014-11-25 DIAGNOSIS — N186 End stage renal disease: Secondary | ICD-10-CM | POA: Diagnosis not present

## 2014-11-25 DIAGNOSIS — D509 Iron deficiency anemia, unspecified: Secondary | ICD-10-CM | POA: Diagnosis not present

## 2014-11-26 ENCOUNTER — Ambulatory Visit: Payer: Self-pay | Admitting: Cardiovascular Disease

## 2014-11-26 DIAGNOSIS — R0602 Shortness of breath: Secondary | ICD-10-CM | POA: Diagnosis not present

## 2014-11-27 ENCOUNTER — Other Ambulatory Visit: Payer: Self-pay

## 2014-11-27 DIAGNOSIS — Z992 Dependence on renal dialysis: Secondary | ICD-10-CM | POA: Diagnosis not present

## 2014-11-27 DIAGNOSIS — R0600 Dyspnea, unspecified: Secondary | ICD-10-CM

## 2014-11-27 DIAGNOSIS — N186 End stage renal disease: Secondary | ICD-10-CM | POA: Diagnosis not present

## 2014-11-27 DIAGNOSIS — D509 Iron deficiency anemia, unspecified: Secondary | ICD-10-CM | POA: Diagnosis not present

## 2014-11-29 DIAGNOSIS — Z992 Dependence on renal dialysis: Secondary | ICD-10-CM | POA: Diagnosis not present

## 2014-11-29 DIAGNOSIS — N186 End stage renal disease: Secondary | ICD-10-CM | POA: Diagnosis not present

## 2014-11-29 DIAGNOSIS — D509 Iron deficiency anemia, unspecified: Secondary | ICD-10-CM | POA: Diagnosis not present

## 2014-12-02 DIAGNOSIS — D509 Iron deficiency anemia, unspecified: Secondary | ICD-10-CM | POA: Diagnosis not present

## 2014-12-02 DIAGNOSIS — N186 End stage renal disease: Secondary | ICD-10-CM | POA: Diagnosis not present

## 2014-12-02 DIAGNOSIS — Z992 Dependence on renal dialysis: Secondary | ICD-10-CM | POA: Diagnosis not present

## 2014-12-04 DIAGNOSIS — Z992 Dependence on renal dialysis: Secondary | ICD-10-CM | POA: Diagnosis not present

## 2014-12-04 DIAGNOSIS — D509 Iron deficiency anemia, unspecified: Secondary | ICD-10-CM | POA: Diagnosis not present

## 2014-12-04 DIAGNOSIS — N186 End stage renal disease: Secondary | ICD-10-CM | POA: Diagnosis not present

## 2014-12-06 DIAGNOSIS — N186 End stage renal disease: Secondary | ICD-10-CM | POA: Diagnosis not present

## 2014-12-06 DIAGNOSIS — Z992 Dependence on renal dialysis: Secondary | ICD-10-CM | POA: Diagnosis not present

## 2014-12-06 DIAGNOSIS — D509 Iron deficiency anemia, unspecified: Secondary | ICD-10-CM | POA: Diagnosis not present

## 2014-12-09 DIAGNOSIS — Z992 Dependence on renal dialysis: Secondary | ICD-10-CM | POA: Diagnosis not present

## 2014-12-09 DIAGNOSIS — D509 Iron deficiency anemia, unspecified: Secondary | ICD-10-CM | POA: Diagnosis not present

## 2014-12-09 DIAGNOSIS — N186 End stage renal disease: Secondary | ICD-10-CM | POA: Diagnosis not present

## 2014-12-11 DIAGNOSIS — N186 End stage renal disease: Secondary | ICD-10-CM | POA: Diagnosis not present

## 2014-12-11 DIAGNOSIS — D509 Iron deficiency anemia, unspecified: Secondary | ICD-10-CM | POA: Diagnosis not present

## 2014-12-11 DIAGNOSIS — Z992 Dependence on renal dialysis: Secondary | ICD-10-CM | POA: Diagnosis not present

## 2014-12-13 DIAGNOSIS — D509 Iron deficiency anemia, unspecified: Secondary | ICD-10-CM | POA: Diagnosis not present

## 2014-12-13 DIAGNOSIS — D631 Anemia in chronic kidney disease: Secondary | ICD-10-CM | POA: Diagnosis not present

## 2014-12-13 DIAGNOSIS — N2581 Secondary hyperparathyroidism of renal origin: Secondary | ICD-10-CM | POA: Diagnosis not present

## 2014-12-13 DIAGNOSIS — N186 End stage renal disease: Secondary | ICD-10-CM | POA: Diagnosis not present

## 2014-12-13 DIAGNOSIS — Z992 Dependence on renal dialysis: Secondary | ICD-10-CM | POA: Diagnosis not present

## 2014-12-17 ENCOUNTER — Other Ambulatory Visit: Payer: Self-pay

## 2014-12-17 ENCOUNTER — Other Ambulatory Visit (INDEPENDENT_AMBULATORY_CARE_PROVIDER_SITE_OTHER): Payer: Medicare Other

## 2014-12-17 DIAGNOSIS — Z01810 Encounter for preprocedural cardiovascular examination: Secondary | ICD-10-CM | POA: Diagnosis not present

## 2014-12-17 DIAGNOSIS — R0600 Dyspnea, unspecified: Secondary | ICD-10-CM

## 2014-12-17 DIAGNOSIS — I2510 Atherosclerotic heart disease of native coronary artery without angina pectoris: Secondary | ICD-10-CM | POA: Diagnosis not present

## 2015-01-04 NOTE — Consult Note (Signed)
Brief Consult Note: Diagnosis: Anemia.   Comments: Patient reports many months of BRBPR. He also was found to have profound anemia with  vessel disease on cath today. Will need a GI workup to look for source of anemia prior to giveng anticoagulation. Will await cardiology guidence to see when we can start the work up in the face of a recent cardiac event.  Electronic Signatures: Lucilla Lame (MD)  (Signed 17-Feb-15 12:31)  Authored: Brief Consult Note   Last Updated: 17-Feb-15 12:31 by Lucilla Lame (MD)

## 2015-01-04 NOTE — Consult Note (Signed)
PATIENT NAME:  Daryl Weeks, Daryl Weeks MR#:  [id number] DATE OF BIRTH:  1961/06/15  DATE OF CONSULTATION:  10/30/2013  CONSULTING PHYSICIAN:  Lucilla Lame, MD CONSULTING SERVICE: Gastroenterology.   REASON FOR CONSULTATION: Anemia.   HISTORY OF PRESENT ILLNESS: This patient is a 54 year old gentleman who comes in with a history of shortness of breath, who has had end-stage renal disease, who is now on hemodialysis. The patient reports that he was doing fine until about 7 months ago when he was experiencing swelling of his extremities. He also says that his abdomen swelled and his testicle swelled. The patient reports at that time he was diagnosed with renal problems. The patient now comes in with an elevated troponin and was found to have anemia with significantly low hemoglobin. The patient's hemoglobin on admission was 8.6, which went down to 7.6 on that very same day. The patient reports that he has had rectal bleeding with bright red blood per rectum for the last 7 months. He also was found to have an increased troponin at 1.3 yesterday morning. The patient went for a cardiac cath today and was found to have 2-vessel disease. He also denies ever having a colonoscopy in the past. His BNP was elevated at 114,000. He also had low albumin with an MCV which was elevated at 102 on admission. The patient's platelets were 282. The patient denies any abdominal pain, nausea, vomiting, fevers, chills, or family history of colon cancer or colon polyps.   PAST MEDICAL HISTORY: IgA nephropathy, hemodialysis, end-stage renal disease, hypertension.   SOCIAL HISTORY: Used to drink 4 beers a day. Now he reports only drinking 2 beers a day. He also reports that he smokes.   FAMILY HISTORY: Noncontributory.   ALLERGIES: No known drug allergies.   HOME MEDICATIONS: Spironolactone, metoprolol, Lasix.  REVIEW OF SYSTEMS: Ten-point review of system negative except what was stated above.   PHYSICAL EXAMINATION:   VITAL  SIGNS: Stable. HEENT: Normocephalic, atraumatic. Extraocular motor intact. Pupils equally round and reactive to light and accommodation. NECK: Without JVD, without lymphadenopathy.  LUNGS: Diffuse coarse rhonchi.  HEART: Regular rate and rhythm without murmurs, rubs or gallops.  ABDOMEN: Soft, nontender, nondistended, without hepatosplenomegaly.  EXTREMITIES: Without cyanosis, clubbing or edema.  NEUROLOGICAL: Grossly intact.  SKIN: Without any lesions or ulcerations.  PSYCHIATRIC: Alert and oriented x 3, in no apparent distress.  LABORATORY DATA: As stated above.   ASSESSMENT AND PLAN: This patient is a 54 year old gentleman who has end-stage renal disease, who was found to have profound anemia. The patient had a cardiac cath today and that showed 2-vessel disease. The patient was reported to require stenting of these in the future and antiplatelet therapy. Prior to that happening, the cardiologist, Dr. Fletcher Anon, would like to have GI clearance before starting him on blood thinners. The patient has been explained that he will need an EGD and colonoscopy in the future. I have spoken to Dr. Fletcher Anon and he said that the patient is safe to proceed with this later this week. The patient will be set up tentatively for this Thursday for an EGD and colonoscopy.   Thank you very much for involving me in the care of this patient. If you have any questions, please do not hesitate to call.   ____________________________ Lucilla Lame, MD dw:jcm D: 10/30/2013 13:24:22 ET T: 10/30/2013 13:35:22 ET JOB#: [id number]  cc: Lucilla Lame, MD, <Dictator> Maylon Sailors [facility] MD ELECTRONICALLY SIGNED 11/02/2013 10:08

## 2015-01-04 NOTE — Op Note (Signed)
PATIENT NAME:  Daryl Weeks, Daryl Weeks MR#:  [id number] DATE OF BIRTH:  25-Mar-1961  DATE OF PROCEDURE:  01/17/2014  PREOPERATIVE DIAGNOSES: 1.  End-stage renal disease.  2.  Recent bacteremia from urinary tract infection with removal of PermCath with the catheter tip being negative and most recent blood cultures being negative.  POSTOPERATIVE DIAGNOSES: 1.  End-stage renal disease.  2.  Recent bacteremia from urinary tract infection with removal of PermCath with the catheter tip being negative and most recent blood cultures being negative.  PROCEDURES: 1.  Ultrasound guidance for vascular access to left internal jugular vein.  2.  Fluoroscopic guidance for placement of catheter.  3. Placement of a 23 cm tip-to-cuff tunneled hemodialysis catheter via the left internal jugular vein.   SURGEON:   [name], M.D.  ASSISTANTS:  Melvyn Neth, PA-C   ANESTHESIA:  Local with sedation.   BLOOD LOSS:  25 mL.   INDICATION FOR PROCEDURE:  A 54 year old gentleman who was admitted several days ago with bacteremia, likely from a UTI.  His PermCath had to be removed.  His cath tip has come back negative and his most recent blood cultures negative and a new PermCath will be placed.  DESCRIPTION OF THE PROCEDURE: The patient was brought to the vascular and interventional radiology suite. The patient's left neck and chest were sterilely prepped and draped and a sterile surgical field was created. The left internal jugular vein was visualized with ultrasound and found to be patent. It was then accessed under direct ultrasound guidance and a permanent image was recorded. A wire was placed. After a skin nick and dilatation, the peel-away sheath was placed over the wire.   I then turned my attention to an area under the clavicle. Approximately 2 fingerbreadths below the clavicle a small counter incision was created and we tunneled from the subclavicular incision to the access site. Using fluoroscopic guidance, a 23  cm tip-to-cuff tunneled hemodialysis catheter was selected, tunneled from the subclavicular incision to the access site. It was then placed through the peel-away sheath and the peel-away sheath was removed. The catheter tips were parked in the right atrium. The appropriate distal connectors were placed. It withdrew blood well and flushed easily with heparinized saline and a concentrated heparin solution was then placed. It was secured to the chest wall with 2 Prolene sutures. The access incision was closed with a single 4-0 Monocryl. A 4-0 Monocryl pursestring suture was placed around the exit site. Sterile dressings were placed.   The patient tolerated the procedure well and was taken to the recovery room in stable condition.    ____________________________ [name], MD jsd:ea D: 01/17/2014 15:16:00 ET T: 01/18/2014 01:14:27 ET JOB#: [id number]  cc: [name], MD, <Dictator>  [name] MD ELECTRONICALLY SIGNED 01/24/2014 15:32

## 2015-01-04 NOTE — Discharge Summary (Signed)
PATIENT NAME:  Daryl, Weeks MR#:  [id number] DATE OF BIRTH:  06-Jan-1961  DATE OF ADMISSION:  10/29/2013 DATE OF DISCHARGE:  11/02/2013  DISCHARGE DIAGNOSES:  1.  Non-ST-elevation myocardial infarction.  2.  Accelerated hypertension.  3.  Colon polyps with chronic blood loss anemia.  4.  End-stage renal disease.  5.  Acute-on-chronic diastolic congestive heart failure.  6.  Tobacco abuse.   CONSULTANTS:  1.  Dr. Fletcher Anon with cardiology.  2.  Dr. Candiss Norse with nephrology.  3.  Dr. Allen [name] of GI.   PROCEDURE: Include: 1.  Cardiac catheterization which showed mid RCA 90% and calcified mid LAD 80% stenosis.  2.  EGD which showed no acute abnormalities.  3.  Colonoscopy which found 14 polyps with polypectomy. Biopsy pending.   Hemodialysis was continued.   IMAGING STUDIES: Include a chest x-ray which showed pulmonary edema.   ADMITTING HISTORY AND PHYSICAL: Please see detailed H and P dictated by Dr. Lavetta Nielsen. This is a 53 year old Caucasian male patient with history of end-stage renal disease secondary to IgA nephropathy, hypertension, presented to the hospital complaining of shortness of breath. The patient was found to have systolic blood pressure greater than 220 along with pulmonary edema and admitted to the hospitalist service.   The patient was also found to have mildly elevated troponin thought initially to be demand ischemia.   HOSPITAL COURSE: 1.  NSTEMI. The patient had significant bump in his troponin after which cardiology was consulted. The patient was started on a heparin drip, aspirin, statin, beta blocker. The patient was taken to cardiac catheterization which showed RCA and LAD stenosis of 90% and 80%. This was thought to be needing staged PCI but with the patient's history of chronic GI bleed for the past six months, cardiac catheterization was not done as there was high risk for further bleeding once the patient is started on Plavix.  2.  GI bleed. The patient was taken to EGD and  colonoscopy by Dr. Allen [name] of GI found to have a normal EGD but colonoscopy showed 14 polyps which were taken out. Biopsies pending at this time.  3.  Due to the polypectomy, the patient currently started on any anticoagulation. Presently he is just on aspirin. Per discussion with Dr. Fletcher Anon, the patient will follow up with him in five days in his outpatient office and once these polypectomy sites are well healed, the patient will have a cardiac catheterization as once he gets cardiac cath, he will need anticoagulation due to his drug-eluting stents.  4.  Accelerated hypertension. The patient has been started on multiple blood pressure medications. Blood pressure is slowly trending down into the 150s at this time. Further titration of medications by Dr. Fletcher Anon as outpatient.  5.  End-stage renal disease on hemodialysis. Continue hemodialysis as outpatient. The patient had dialysis in hospital.  6.  Acute-on-chronic diastolic CHF secondary to accelerated hypertension and NSTEMI. This resolved with hemodialysis.   Prior to discharge, the patient does not have any crackles on examination. Does not have any chest pain on ambulation. I have discussed extensively with the patient and his son, also with Dr. Fletcher Anon. Presently, the patient is a high risk for further NSTEMIs, but considering his polypectomy, he is thought to be high risk for bleeding by cardiology, and the patient is being discharged home to follow up with Dr. Fletcher Anon. I have requested that he not get involved in any exertional activity, use nitroglycerin p.r.n. for any chest pain and return to the Emergency Room  at first signs of any further chest pain or shortness of breath.   DISCHARGE MEDICATIONS: 1.  Spironolactone  25 mg oral once a day.  2.  Lasix 80 mg oral 2 times a day.  3.  Metoprolol tartrate 100 mg oral 2 times a day.  4.  Losartan 100 mg daily.  5.  Atorvastatin 20 mg daily.  6.  Aspirin 325 mg daily.  7.  Hydralazine 50 mg oral every 8  hours.  8.  Amlodipine 10 mg oral once a day.  9.  Nitroglycerin 0.4 sublingual as needed for chest pain.   DISCHARGE INSTRUCTIONS: Renal diet of regular consistency.   ACTIVITY: No exertional activity.   FOLLOWUP: With Dr. Fletcher Anon in 5 to 7 days. Primary care physician and Dr. Allen [name] in 1 to 2 weeks.   Biopsy results to be given by Dr. Allen [name] at followup.   TIME SPENT ON DAY OF DISCHARGE IN DISCHARGE ACTIVITY: Was 40 minutes.   ____________________________ [name], MD srs:np D: 11/07/2013 16:40:00 ET T: 11/07/2013 20:09:56 ET JOB#: [id number]  cc: Alveta Heimlich R. Janyla Biscoe, MD, <Dictator> Neita Carp MD ELECTRONICALLY SIGNED 11/15/2013 18:41

## 2015-01-04 NOTE — H&P (Signed)
PATIENT NAME:  Daryl Weeks, BARRACO MR#:  [id number] DATE OF BIRTH:  [birth year]  DATE OF ADMISSION:  10/29/2013  REFERRING PHYSICIAN: Marjean Donna, MD  PRIMARY CARE PHYSICIAN: None. Does follow with nephrology though.   CHIEF COMPLAINT: Shortness of breath.   HISTORY OF PRESENT ILLNESS: A 54 year old Caucasian gentleman with past medical history of end-stage renal disease on hemodialysis Monday, Wednesday, and Friday, who recently started dialysis approximately 1 week ago secondary to IgA nephropathy. He also has a history of hypertension, presenting with shortness of breath. Describes acute onset of shortness of breath, mainly as he is unable to catch his breath. No preceding coughs, fevers, or chills. No recent sick contacts. He did however describe associated chest pain, which is retrosternal and nonradiating, described only as pain intensity 2 to 3 out of 10. No worsening or relieving factors. With the above symptoms, he called his son and at urgency of his son, they called EMS who brought him here. During initial ER work-up he was found to have pulmonary edema as well as to be markedly hypertensive. Currently, he is without complaints.   REVIEW OF SYSTEMS: CONSTITUTIONAL: Denies fever, fatigue, chills, weakness.  EYES: Denies blurred vision, double vision, eye pain.  EARS, NOSE, THROAT: Denies tinnitus, ear pain, hearing loss.  RESPIRATORY: Positive for shortness of breath, however, denies cough or wheeze.  CARDIOVASCULAR: Positive for chest pain, described above. Denies any orthopnea, edema, or palpitations.  GASTROINTESTINAL: Denies nausea, vomiting, diarrhea, or abdominal pain.  GENITOURINARY: Denies dysuria or hematuria.  ENDOCRINE: Denies nocturia or thyroid problems.  HEME AND LYMPH: Denies easy bruising or bleeding.  SKIN: Denies rash or lesion.  MUSCULOSKELETAL: Denies pain in neck, back, shoulder, knees, hips, or arthritic symptoms.  NEUROLOGIC: Denies paralysis or paresthesias.   PSYCHIATRIC: Denies anxiety or depressive symptoms. Otherwise, full review of systems performed by me is negative.   PAST MEDICAL HISTORY: IgA nephropathy requiring hemodialysis, end-stage renal disease on dialysis Monday, Wednesday, and Friday, started approximately 1 week ago, and hypertension.   SOCIAL HISTORY: Every day tobacco use. Occasional alcohol use. No drug usage.   FAMILY HISTORY: Positive for hypertension. Does not know of any other people that are positive for hypertension and congestive heart failure.   ALLERGIES: No known drug allergies.   HOME MEDICATIONS: Include spironolactone 25 mg p.o. daily, metoprolol 25 mg p.o. b.i.d., and Lasix 80 mg p.o. b.i.d.   PHYSICAL EXAMINATION: VITAL SIGNS: Temperature 97.5, heart rate 99, respirations 24, blood pressure 216/124, and saturating 88% on room air. Weight 72.6 kg. BMI 23.6. GENERAL: Well-nourished, well-developed gentleman currently in minimal distress secondary to respiratory status.  HEAD: Normocephalic, atraumatic.  EYES: Pupils equal, round, and reactive to light. Extraocular movements intact. No scleral icterus.  MOUTH: Moist mucosal membranes. Dentition intact. No abscess noted.  EARS, NOSE, AND THROAT: Throat clear without exudates. No external lesions.  NECK: Supple. No thyromegaly. No nodules. No JVD.  PULMONARY: Diffuse coarse breath sounds with scattered rhonchi, most prominent on the right side. No use of accessory muscles. Good respiratory effort.  CHEST: Nontender to palpation.  CARDIOVASCULAR: S1 and S2 regular rate and rhythm. No murmurs, rubs, or gallops. No edema. Pedal pulses 2+ bilaterally. GASTROINTESTINAL: Soft, nontender, and nondistended. No masses. Positive bowel sounds. No hepatosplenomegaly.  MUSCULOSKELETAL: No swelling, clubbing, or edema. Range of motion is full in all extremities.  NEUROLOGIC: Cranial nerves II through XII intact.  Sensation intact. Reflexes intact.  SKIN: No ulcerations,  lesions, rash, or cyanosis. Warm and dry. Turgor intact.  PSYCHIATRIC: Mood and affect good. Alert and oriented x3. Insight and judgment intact.   LABORATORY AND DIAGNOSTICS: EKG: Normal sinus rhythm. No ST or T wave abnormalities.   Chest x-ray: Stable cardiomegaly with mild to moderate diffuse interstitial edema.   Remainder of laboratory data: Sodium 131, potassium 4.3, chloride 98, bicarb 23, BUN 36, creatinine 5.16, glucose 107. BNP [id number]. LFTs: Albumin 2.1, remainder within normal limits. Troponin I 0.07. CK-MB 3.2.   ASSESSMENT AND PLAN: A 54 year old Caucasian gentleman with history of end-stage renal disease on hemodialysis presenting with acute onset of shortness of breath.  1.  Hypertensive emergency with congestive heart failure. Blood pressure improved in the Emergency Department with nitroglycerin. Will add p.r.n. hydralazine 10 mg IV as needed for systolic blood pressure greater than [id number] or diastolic greater than [id number]. Continue Lasix therapy 80 mg, change p.o. to IV, given potential bowel edema. Provide supplemental oxygen to keep oxygen saturation greater than 92%. DuoNeb therapies. Consult nephrology for continuation of hemodialysis.  2.  Chest pain with elevated troponin in the setting of end-stage renal disease. EKG within normal limits. Admit to telemetry. Trend cardiac enzymes. Give aspirin and statin therapy.  3.  Hypertension. Restart spironolactone and Lopressor at home doses.  4.  For deep venous thrombosis prophylaxis provide Heparin subcutaneous.   CODE STATUS: The patient FULL code.   TIME SPENT: 45 minutes.  ____________________________ Aaron Mose. Hower, MD dkh:sb D: 10/29/2013 04:08:26 ET T: 10/29/2013 07:23:27 ET JOB#: [id number]  cc: Aaron Mose. Hower, MD, <Dictator> DAVID Woodfin Ganja MD ELECTRONICALLY SIGNED 10/30/2013 2:04

## 2015-01-04 NOTE — Op Note (Signed)
PATIENT NAME:  Daryl Weeks, Daryl Weeks MR#:  [id number] DATE OF BIRTH:  10-28-1960  DATE OF PROCEDURE:  05/23/2014  PREOPERATIVE DIAGNOSES:  1.  End-stage renal disease.  2.  Functional left arm arteriovenous fistula, no longer needing PermCath.   POSTOPERATIVE DIAGNOSIS:  1.  End-stage renal disease.  2.  Functional left arm arteriovenous fistula, no longer needing PermCath.   PROCEDURE: Removal of left PermCath.  SURGEON:  [name], MD.  ANESTHESIA:  Local.  ESTIMATED BLOOD LOSS:  Minimal.  INDICATION FOR PROCEDURE:  This is a 54 year old gentleman with end-stage renal disease. His left arm AV fistula is reliably working for dialysis access at this point he no longer needs his PermCath. This will be removed.   Risks and benefits were discussed and informed consent was obtained.   DESCRIPTION OF PROCEDURE:  The patient's left neck, chest and existing catheter were sterilely prepped and draped.  The area around the catheter was anesthetized copiously with 1% Lidocaine. The catheter was dissected out with curved hemostats until the cuff was freed from the surrounding fibrous sheath.  The fibrous sheath was transected and the catheter was then removed in its entirety using gentle traction.  Pressure was held and sterile dressings placed.    The patient tolerated the procedure well and was taken to the recovery room in stable condition.   ____________________________ [name], MD jsd:lt D: 05/23/2014 10:51:38 ET T: 05/23/2014 11:00:41 ET JOB#: [id number]  cc: [name], MD, <Dictator> [name] MD ELECTRONICALLY SIGNED 05/27/2014 15:06

## 2015-01-04 NOTE — Consult Note (Signed)
Chief Complaint:  Subjective/Chief Complaint Pt denies any rectal bleeding, abdominal pain, nausea or vomiting.  Currently getting hemodyalysis.   VITAL SIGNS/ANCILLARY NOTES: **Vital Signs.:   18-Feb-15 04:40  Vital Signs Type Routine  Temperature Temperature (F) 98.3  Celsius 36.8  Temperature Source oral  Pulse Pulse 83  Respirations Respirations 20  Systolic BP Systolic BP 629  Diastolic BP (mmHg) Diastolic BP (mmHg) 94  Mean BP 123  Pulse Ox % Pulse Ox % 94  Pulse Ox Activity Level  At rest  Oxygen Delivery Room Air/ 21 %    06:14  Vital Signs Type Recheck  Pulse Pulse 85  Systolic BP Systolic BP 528  Diastolic BP (mmHg) Diastolic BP (mmHg) 93  Mean BP 122   Brief Assessment:  GEN well developed, well nourished, no acute distress, A/Ox3   Cardiac Regular   Respiratory normal resp effort   Gastrointestinal Normal   Gastrointestinal details normal Soft  Nontender  Nondistended  Bowel sounds normal  No rebound tenderness  No gaurding   EXTR negative edema   Additional Physical Exam Skin: pink, warm, dry   Lab Results: Hepatic:  18-Feb-15 08:51   Albumin, Serum  1.8  Routine Chem:  18-Feb-15 08:51   Glucose, Serum  103  BUN  31  Creatinine (comp)  4.94  Sodium, Serum  132  Potassium, Serum 3.5  Chloride, Serum  97  CO2, Serum 29  Calcium (Total), Serum 8.5  Phosphorus, Serum 4.3  Anion Gap  6  Osmolality (calc) 271  eGFR (African American)  15  eGFR (Non-African American)  13 (eGFR values <68m/min/1.73 m2 may be an indication of chronic kidney disease (CKD). Calculated eGFR is useful in patients with stable renal function. The eGFR calculation will not be reliable in acutely ill patients when serum creatinine is changing rapidly. It is not useful in  patients on dialysis. The eGFR calculation may not be applicable to patients at the low and high extremes of body sizes, pregnant women, and vegetarians.)  Routine Hem:  18-Feb-15 04:48   Hemoglobin  (CBC)  9.2 (Result(s) reported on 31 Oct 2013 at 05:37AM.)    08:51   WBC (CBC) 9.4  RBC (CBC)  2.92  Hemoglobin (CBC)  9.9  Hematocrit (CBC)  28.3  Platelet Count (CBC) 214  MCV 97  MCH 33.9  MCHC 35.0  RDW  14.9  Neutrophil % 69.5  Lymphocyte % 12.1  Monocyte % 15.1  Eosinophil % 2.3  Basophil % 1.0  Neutrophil # 6.5  Lymphocyte # 1.1  Monocyte #  1.4  Eosinophil # 0.2  Basophil # 0.1 (Result(s) reported on 31 Oct 2013 at 10:22AM.)   Assessment/Plan:  Assessment/Plan:  Assessment Anemia: Hgb improved with appropriate transfusion response s/p 1 unit PRBCS.  He has been cleared by cardiology for EGD/colonoscopy prior to commitment for medical management of his disease.  Discussed risks/benefits of procedure which include but are not limited to bleeding, infection, perforation & drug reaction.  Patient agrees with this plan & consent will be obtained. Intermittent hematochezia:  None currently   Plan 1) Colonoscopy/EGD tomorrow with Dr WAllen [name]2) Standard prep however RN CMalachy Moodadvised to discontinue prep as soon as clear given pt's CHF/CKD 3) Clear liquids today, NPO except meds after MN Please call if you have any questions or concerns   Electronic Signatures: JAndria Meuse(NP)  (Signed 18-Feb-15 11:42)  Authored: Chief Complaint, VITAL SIGNS/ANCILLARY NOTES, Brief Assessment, Lab Results, Assessment/Plan   Last Updated: 18-Feb-15 11:42  by Andria Meuse (NP)

## 2015-01-04 NOTE — H&P (Signed)
PATIENT NAME:  Daryl Weeks, Daryl Weeks MR#:  [id number] DATE OF BIRTH:  [birth year]  DATE OF ADMISSION:  01/11/2014  PRIMARY CARE PHYSICIAN: Dr. Allen [name].   PRIMARY NEPHROLOGIST: Dr. Holley Raring.  PRIMARY CARDIOLOGIST: Dr. Fletcher Anon   CHIEF COMPLAINT: Sent from the dialysis center for positive blood cultures.   HISTORY OF PRESENT ILLNESS: Daryl Weeks is a 54 year old pleasant male who did not have any health problems until February 2015 when patient was found to have acute renal failure and started on dialysis. The patient was also found to have non-ST elevation MI. The patient underwent heart catheterization with near complete occlusion of the RCA and the LAD. The patient also had a history of bleeding for which he underwent a colonoscopy with the removal of multiple polyps. Concerning these risk factors, the patient underwent coronary artery bypass grafting pain on 11/25/2013. Plan to do the AV fistula for which reason regular labs were done. Urinalysis showed significant urinary tract infection. The patient was given ciprofloxacin; however, the patient has not started on this medication yet. Meanwhile, today, patient received a call from Dr. Elwyn Lade office to the Emergency Department concerning about positive blood cultures.   Workup in the Emergency Department: Urinalysis is strongly positive for UTI with a WBC count of 3800 and 3+ leukocyte esterase with 3+ bacteria. The patient has elevated white blood cell count, fever of 100.8. The patient received vancomycin and Zosyn after obtaining blood cultures from the PermCath as well as peripheral blood cultures. The patient states also has been having cough with mild productive sputum. Has been experiencing shortness of breath with exertion. Has poor appetite. The patient gets dialysis Monday, Wednesday, Friday.  PAST MEDICAL HISTORY:  1. Coronary artery disease status post coronary artery bypass grafting.  2. End-stage renal disease on hemodialysis.  3. Hypertension.  4.  Hyperlipidemia.  5. Peripheral vascular disease.  6. Continued tobacco use.   ALLERGIES: No known drug allergies.   HOME MEDICATIONS:  1. Ambien 5 mg once a day.  2. Nitroglycerin 0.4 mg sublingual as needed.  3. Metoprolol 100 mg every 12 hours.  4. Hydralazine 50 mg every 9 hours. 5. Lasix 40 mg 2 times a day.  6. Atorvastatin 20 mg once a day.  7. Amlodipine 10 mg once a day.  8. Amiodarone 200 mg once a day.   SOCIAL HISTORY: Continues to smoke 1 pack a day. Denies drinking alcohol or using illicit drugs. Lives by himself.   FAMILY HISTORY: Positive for hypertension.   REVIEW OF SYSTEMS:  CONSTITUTIONAL: Experiencing generalized weakness, fatigue.  EYES: No change in vision.  ENT: No change in hearing.  RESPIRATORY: Has cough, shortness of breath.  CARDIOVASCULAR: No chest pain, palpations.  GASTROINTESTINAL: Has decreased appetite, some nausea. GENITOURINARY: Does not experience any dysuria.  ENDOCRINE: No polyuria or polydipsia.  HEMATOLOGIC: No easy bruising or bleeding.  SKIN: No rash or lesions.  MUSCULOSKELETAL: Generalized body aches.  NEUROLOGIC: No weakness or numbness in any part of the body.   PHYSICAL EXAMINATION:  GENERAL: This is a well-built, well-nourished, age-appropriate male lying down in the bed, not in distress.  VITAL SIGNS: Temperature 98.6, pulse 166, blood pressure 127/74, respiratory rate of 18, oxygen saturation is 95% on room air.  HEENT: Head normocephalic, atraumatic. No scleral icterus. Conjunctivae normal. Pupils equal and react to light. Extraocular movements are intact. Mucous membranes moist. . NECK: Supple. No lymphadenopathy. No JVD. No carotid bruits. No thyromegaly.  CHEST: Has no focal tenderness.  LUNGS: Bilaterally occasional wheezing, mild  prolonged expiratory phase.  HEART: S1, S2, regular. No murmurs are heard.  ABDOMEN: Bowel sounds present. Soft, nontender, nondistended. No hepatosplenomegaly.  EXTREMITIES: No pedal edema.  Pulses 2+.  SKIN: No rash or lesions.  MUSCULOSKELETAL: Good range of motion in all the extremities.  NEUROLOGIC: The patient is alert, oriented to place, person, and time, cranial nerves II-XII intact. Motor 5/5 in upper and lower extremities.   LABORATORY DATA: CBC: , hemoglobin 11.1, platelet count of 229,000.   Complete metabolic panel: Potassium 2.9. BUN 12, creatinine of 3.04. The rest of all the values are within normal limits.   Chest x-ray, 1 view portable: No acute cardiopulmonary disease.   Urinalysis as mentioned above, WBC of 3700, and 3+ leukocyte esterase, 3+ bacteria.   ASSESSMENT AND PLAN: Ms. Corpe is a 54 year old male sent from Dr. Elwyn Lade office with bacteremia and sepsis.  1. Sepsis secondary to urinary tract infection. Blood cultures have been obtained. Sputum cultures have been sent. Continue the Vanco and Zosyn until culture data is available.  2. Bacteremia. We do not have the type of the bacteria in the bed. Obtain blood cultures from the PermCath as well as peripheral cultures. Does not have any redness around the PermCath site. Does not seem to be infected; however, cannot exclude. Follow up with the blood cultures. Continue the Vanco and Zosyn for now.  3. Chronic obstructive pulmonary disease exacerbation: Continue with the breathing treatments, Solu-Medrol, and antibiotics.  4. End-stage renal disease on hemodialysis. The patient had dialysis today. We will consult with Dr. Holley Raring for the followup as the patient is likely to stay on beyond Monday based on the culture reports.  5. Continued tobacco use. Counseled with the patient regarding quitting smoking. The patient expressed understanding.  6. Hypertension: Currently well controlled. Continue with the home medications.  7. Keep the patient on deep vein thrombosis prophylaxis with heparin.   TIME SPENT: 55 minutes.    ____________________________ Monica Becton, MD pv:lt D: 01/12/2014 00:16:41  ET T: 01/12/2014 02:13:06 ET JOB#: [id number]  cc: Monica Becton, MD, <Dictator> Lucilla Lame, MD Monica Becton MD ELECTRONICALLY SIGNED 01/14/2014 20:58

## 2015-01-04 NOTE — Consult Note (Signed)
Chief Complaint:  Subjective/Chief Complaint Pt had few episodes rectal bleeding that cleared last night.  No further bleeding this morning.  Denies abdominal pain, nausea or vomiting.   VITAL SIGNS/ANCILLARY NOTES: **Vital Signs.:   20-Feb-15 05:27  Vital Signs Type Routine  Temperature Temperature (F) 98.1  Celsius 36.7  Temperature Source oral  Pulse Pulse 73  Respirations Respirations 16  Systolic BP Systolic BP 123XX123  Diastolic BP (mmHg) Diastolic BP (mmHg) 85  Mean BP 113  Pulse Ox % Pulse Ox % 99  Pulse Ox Activity Level  At rest  Oxygen Delivery Room Air/ 21 %    11:50  Vital Signs Type off unit   Brief Assessment:  GEN well developed, well nourished, no acute distress, A/Ox3.  Currently receiving dialysis.   Cardiac Regular   Respiratory normal resp effort   Gastrointestinal Normal   Gastrointestinal details normal Soft  Nontender  Nondistended  Bowel sounds normal  No rebound tenderness  No gaurding   EXTR negative edema   Additional Physical Exam Skin: pink, warm, dry   Lab Results: Routine Chem:  20-Feb-15 09:41   Phosphorus, Serum 3.5 (Result(s) reported on 02 Nov 2013 at 10:05AM.)   Assessment/Plan:  Assessment/Plan:  Assessment Anemia: Hgb stable IDA: s/p multiple colon polypectomies, biopsies pending.  Rectal bleeding resolving.  Would continue to monitor.   Plan 1) FU pathology 2) Monitor H/H Please call if you have any questions or concerns   Electronic Signatures: Andria Meuse (NP)  (Signed 20-Feb-15 13:37)  Authored: Chief Complaint, VITAL SIGNS/ANCILLARY NOTES, Brief Assessment, Lab Results, Assessment/Plan   Last Updated: 20-Feb-15 13:37 by Andria Meuse (NP)

## 2015-01-04 NOTE — Op Note (Signed)
PATIENT NAME:  Daryl Weeks, Daryl Weeks MR#:  [id number] DATE OF BIRTH:  May 16, 1961  DATE OF PROCEDURE:  06/17/2014  PREOPERATIVE DIAGNOSES:  1.  End-stage renal disease.  2.  Left arm steal syndrome with patent left radial basilic AV fistula.   POSTOPERATIVE DIAGNOSES:  1.  End-stage renal disease.  2.  Left arm steal syndrome with patent left radial basilic AV fistula.   OPERATION PERFORMED: Ligation of radial artery distal to fistula.   SURGEON: [name], MD  ANESTHESIA: General.   ESTIMATED BLOOD LOSS: Minimal.   INDICATION FOR PROCEDURE: A 54 year old gentleman with end-stage renal disease. He has steal symptoms in his left hand. He has had an angiogram showing a patent palmar arch with retrograde flow through the hand into the radial artery retrograde and into the fistula. The steal symptoms can likely be corrected, as his ulnar flow to the hand is normal, by ligating his radial artery distal to the fistula and avoiding the retrograde flow. Risks and benefits were discussed. Informed consent was obtained.   DESCRIPTION OF PROCEDURE: The patient is brought to the operative suite, and after an adequate level of general anesthesia was obtained, the left upper extremity was sterilely prepped and draped and a sterile surgical field was created. A small incision was created distal to the previous fistula incision, and the radial artery was dissected out at this location. It was doubly ligated with two 2-0 silk ties to occlude flow. The fistula remained clinically patent. The wound was then closed with a 3-0 Vicryl and 4-0 Monocryl. Dermabond was placed as a dressing. The patient was awakened from anesthesia and taken to the recovery room in stable condition, having tolerated the procedure well.    ____________________________ [name], MD jsd:je D: 07/18/2014 13:40:30 ET T: 07/18/2014 14:32:56 ET JOB#: [id number]  cc: [name], MD, <Dictator> [name] MD ELECTRONICALLY SIGNED  07/29/2014 10:11

## 2015-01-04 NOTE — Op Note (Signed)
PATIENT NAME:  Daryl Weeks, Daryl Weeks MR#:  [id number] DATE OF BIRTH:  15-Feb-1961  DATE OF PROCEDURE:  07/04/2014  PREOPERATIVE DIAGNOSES: 1.  End-stage renal disease.  2.  Steal syndrome, left upper extremity, with radial basilic arteriovenous fistula.   POSTOPERATIVE DIAGNOSES: 1.  End-stage renal disease.  2.  Steal syndrome, left upper extremity, with radial basilic arteriovenous fistula.   PROCEDURES: 1.  Ultrasound guidance for vascular access to right femoral artery.  2.  Catheter placement into left radial artery and left ulnar artery from right femoral approach.  3.  Thoracic aortogram and selective left upper extremity angiogram.  4.  StarClose closure device, right femoral artery.   SURGEON:  [name], MD   ANESTHESIA:  Local with moderate conscious sedation.   ESTIMATED BLOOD LOSS:  Minimal.   FLUOROSCOPY TIME:  Approximately 3 minutes, and 50 mL of contrast were used.   INDICATION FOR PROCEDURE:  This is a 54 year old gentleman who is on dialysis. He has a radial basilic AV fistula. He has developed worsening hand pain, as the fistula has matured and gotten larger. His symptoms are consistent with steal syndrome without another obvious source. He is brought in for angiography for further evaluation and potential treatment. Risks and benefits were discussed. Informed consent was obtained.   DESCRIPTION OF PROCEDURE:  The patient is brought to the vascular suite. Groins were sterilely prepped and draped, and a sterile surgical field was created. The right femoral artery was visualized with ultrasound and found to be widely patent. It was then accessed under direct ultrasound guidance with a Seldinger needle, and a permanent image was recorded. A J-wire and 5-French sheath were then placed. Pigtail catheter was placed into the ascending aorta, and an LAO projection thoracic aortogram was performed. This showed a bovine configuration of the aortic arch with no significant stenosis in  the proximal great vessels. I then used a Headhunter catheter to selectively cannulate the left subclavian artery. Imaging was performed with sequential advancement of the catheter. The left subclavian artery, axillary artery, and brachial artery were all widely patent. He then had a typical brachial bifurcation around the level of the elbow. The interosseous artery came off the ulnar artery. I selectively cannulated the radial artery, which was the inflow for the fistula. Even on the initial imaging in the upper arm, there was brisk venous outflow through the radial artery. No significant flow was seen in the distal radial artery with brisk flow through the fistula. On imaging at the brachial artery, the majority of the flow went through the radial artery into the basilic vein through the fistula with not as much flow seen in the hand consistent with steal syndrome. I then selectively cannulated the ulnar artery and advanced to the mid ulnar artery. The ulnar artery was widely patent without any significant stenosis. He had a complete palmar arch, and there was actually pretty brisk flow through the hand and out the basilic vein through the fistula, again, consistent with steal syndrome. There was no focal stenosis in the radial or ulnar arteries to be treated from a percutaneous standpoint, and we would plan ligation of the distal radial artery to improve his steal symptoms. At this point, I elected to terminate the procedure. The diagnostic catheter was removed. Oblique arteriogram was performed of the right femoral artery, and StarClose closure device was deployed in the usual fashion with excellent hemostatic result. The patient tolerated the procedure well and was taken to the recovery room in stable  condition.    ____________________________ [name], MD jsd:nb D: 07/04/2014 13:31:58 ET T: 07/05/2014 02:14:21 ET JOB#: [id number]  cc: [name], MD, <Dictator> [name] MD ELECTRONICALLY  SIGNED 07/11/2014 15:27

## 2015-01-04 NOTE — Op Note (Signed)
PATIENT NAME:  Daryl Weeks, Daryl Weeks MR#:  [id number] DATE OF BIRTH:  [birth year]  DATE OF PROCEDURE:  10/22/2013 1.  End-stage renal disease.  2.  Hypertension.   PREOPERATIVE DIAGNOSES:   1.  End-stage renal disease.  2.  Hypertension.   PROCEDURES: 1.  Ultrasound guidance for vascular access to right internal jugular vein.  2.  Fluoroscopic guidance for placement of catheter.  3. Placement of a 19 cm tip-to-cuff tunneled hemodialysis catheter via the right internal jugular vein.   SURGEON:  [name], M.D.  ANESTHESIA: Local with moderate conscious sedation.   BLOOD LOSS: Minimal.   INDICATION FOR PROCEDURE: A 54 year old gentleman with end-stage renal disease. He has chronic kidney disease that has now progressed and needs to start dialysis immediately,  and we are asked to place a PermCath today.   DESCRIPTION OF THE PROCEDURE: The patient was brought to the vascular and interventional radiology suite. The patient's right neck and chest were sterilely prepped and draped and a sterile surgical field was created. The right internal jugular vein was visualized with ultrasound and found to be patent. It was then accessed under direct ultrasound guidance and a permanent image was recorded. A wire was placed. After a skin nick and dilatation, the peel-away sheath was placed over the wire.   I then turned my attention to an area under the clavicle. Approximately 2 fingerbreadths below the clavicle a small counter incision was created and we tunneled from the subclavicular incision to the access site. Using fluoroscopic guidance, a 19 cm tip-to-cuff tunneled hemodialysis catheter was selected, tunneled from the subclavicular incision to the access site. It was then placed through the peel-away sheath and the peel-away sheath was removed. The catheter tips were parked in the right atrium. The appropriate distal connectors were placed. It withdrew blood well and flushed easily with heparinized saline  and a concentrated heparin solution was then placed. It was secured to the chest wall with 2 Prolene sutures. The access incision was closed with a single 4-0 Monocryl. A 4-0 Monocryl pursestring suture was placed around the exit site. Sterile dressings were placed.   The patient tolerated the procedure well and was taken to the recovery room in stable condition.        ____________________________ [name], MD jsd:dmm D: 10/22/2013 16:32:16 ET T: 10/22/2013 22:58:00 ET JOB#: [id number]  cc: [name], MD, <Dictator> [name] MD ELECTRONICALLY SIGNED 10/31/2013 10:50

## 2015-01-04 NOTE — Discharge Summary (Signed)
PATIENT NAME:  Daryl Weeks, Daryl Weeks MR#:  [id number] DATE OF BIRTH:  01/04/1961  DATE OF ADMISSION:  01/11/2014 DATE OF DISCHARGE:  01/17/2014  DISCHARGE DIAGNOSES: 1. Sepsis secondary to Klebsiella bacteremia.  2. End-stage renal disease on hemodialysis.  3. Coronary artery disease status post bypass.  DISCHARGE MEDICATIONS:  Atorvastatin 20 mg p.o. daily, hydralazine 50 mg p.o. every 8 hours, amlodipine 10 mg daily, nitroglycerin sublingual p.r.n.  chest pain, Ambien 5 mg daily, furosemide 80 mg p.o. b.i.d., amiodarone 100 mg daily, metoprolol succinate 50 mg p.o. daily,  Rocephin1 gram IV with hemodialysis. The patient's amiodarone and also metoprolol doses are decreased because of borderline bradycardia.   HOSPITAL CONSULTATION: Nephrology consult with Dr. Candiss Norse.   HOSPITAL COURSE: The patient is a 54 year old male with a history of coronary artery disease status post CABG, ESRD on hemodialysis, hypertension, hyperlipidemia, sent in from Dr. Elwyn Lade office because of positive blood cultures. The patient's blood cultures in their office showed Klebsiella sensitive to all antibiotics. The patient's temperature was 100.8 with poor p.o. intake and with generalized weakness. In the ER, the patient's urine was positive for UTI with white count 3800 in the urine and 3+ leukocyte esterase. The patient admitted to hospitalist service for sepsis. The patient received vancomycin and Zosyn.  The patient's white count on admission was 21.5, hemoglobin 9.3, hematocrit 29. The patient's blood cultures from April did not show any growth for 5 days.  A urine culture showed 75,000 colonies of Candida and patient seen by nephrology,  Dr. Candiss Norse, and we narrowed on antibiotic to Rocephin to cover for Klebsiella and patient's PermCath on the right anterior chest was discontinued. The patient's PermCath was removed after the dialysis on the 4th. Her blood cultures from PermCath from April 4th showed gram negative rods, but  blood cultures from the periphery did not show any growth.  The patient's PermCath was removed on 4th and he did not have a dialysis access for 3 days, but he did not have any fluid overload signs and the patient's blood cultures peripherally  was done again yesterday on the 6th that did not show any growth.  So, today he is going to have another PermCath placed and then, after that, he can go home and Dr. Candiss Norse planning to give him Tressie Ellis 1 gram IV with hemodialysis sessions for about another week, like 3 more doses. The patient's white count has been normalized. His sepsis symptoms improved and his blood pressure is 151/76, heart rate 63, temperature 97.8.   DISCHARGE DIAGNOSES: 1. History of coronary artery bypass grafting. The patient was admitted in February.  At that time, he had MI and had a bypass surgery. The patient is on amiodarone and metoprolol but heart rate is around 50s to 60s so I had to decrease the amiodarone and metoprolol and patient also had history of rectal bleeding. The patient had iron deficiency anemia and a colonoscopy was done in February which showed multiple polyps.  They were removed and rectal bleeding was resolved at that time.  2. End stage renal disease.  Regarding that, the patient is on hemodialysis and he probably needs an AV fistula in the near future.  3. Chronic diastolic heart failure.  Metoprolol is decreased from 100 mg twice daily and then his amiodarone also is decreased. The patient on amiodarone 200 mg once a day at home. I have to decrease that to100 mg daily and the patient right now is on metoprolol succinate 50 mg daily.  TIME SPENT ON DISCHARGE PREPARATION: More than 30 minutes.  PHYSICAL EXAMINATION: CARDIOVASCULAR: S1 and S2 regular.  LUNGS: Clear to auscultation. No wheeze noted.  ABDOMEN: Soft, nontender, nondistended. Bowel sounds present. No extremity edema. No cyanosis. No clubbing.  NEUROLOGICAL:  No neurological deficit.   The patient was  stable for discharge home after PermCath placement and dialysis today. Discussed this plan with by Dr. Candiss Norse.   TIME SPENT: More than 30 minutes.    ____________________________ Epifanio Lesches, MD sk:dd/am D: 01/17/2014 14:23:10 ET T: 01/18/2014 00:51:14 ET JOB#: [id number]  cc: Epifanio Lesches, MD, <Dictator> Epifanio Lesches MD ELECTRONICALLY SIGNED 01/31/2014 10:10

## 2015-01-04 NOTE — Consult Note (Signed)
General Aspect Daryl Weeks is a 54yo Caucasian male w/ PMHx s/f IgA nephropathy, ESRD (HD on MWF), HTN, ongoing tobacco abuse and family h/o premature CAD who was admitted to [facility] last night with hypertensive emergency. Cardiology has been consulted for chest pain, + troponin.  He is a vague historian. He reports intermittent, exertional chest discomfort "ever since seeing a doctor." He reports undergoing what sounds like an ETT 5-10 years ago which was normal. He reports smoking 1 PPD x 30 years. Father w/ MI at 41, passed after his 47rd MI at 35.   He has recently been diagnosed w/ nephrotic syndrome 02/2013, and subsequent ESRD from IgA nephropathy (diagnosed by renal bx). He has started dialysis via Permcath last week. He still makes urine. He reports taking several medications for blood pressure. His nephrologist has previously discontinued lisinopril. He is not aware of the reason. No known drug allergies.   He reports a gradual functional decline w/ the progression of his renal insufficiency. He does report persistent, exertional chest discomfort and dyspnea. He used to play golf, fish and mow the yard, but is now only limited to ambulating around the house. If pushed to exert himself, he does believe he could induce chest pain.   He was in his USOH until ~midnight this AM when he developed sudden onset of dyspnea waking him from sleep w/ associated substernal chest pressure rated at a 2-3/10. He states deep inspiration aggravated the discomfort. No radiation. No associated diaphoresis or nausea. He had taken his antihypertensives and Lasix as prescribed. He called his son w/ these complaints who then relayed this to EMS. He was subsequently transported to the ED.   Present Illness There, initial BP was elevated at 216-222/124-126. RR 24, O2 sat 88% on RA. BNP [id number]. CXR revealed stable cardiomegaly, mild-moderate diffuse interstitial edema. An initial TnI returned at 0.07. EKG showed diffuse  upsloping ST depressions, no specific ischemic findings. Other notable labwork: Na 131, Ca 8.1, albumin 2.1, BUN 36/5.16, WBC 13.6K, Hgb/Hct 8.6/26.7, u/a > 500 protein. He was diagnosed w/ hypertensive emergency w/ flash pulmonary edema, admitted by the medicine team, started on Lasix 80 IV BID, NTG gtt and hydralazine IV.   A 2D echo was performed revealing EF 60-65%, impaired LV diastolic filling, mild MR/mod TR, moderate LVH, moderate LAE, severe posterior wall thickness. PASP 33 mmHg. Normal RV size and function.  Two subsequent troponins have returned at 0.63, 1.30.  PAST MEDICAL HISTORY: IgA nephropathy requiring hemodialysis, end-stage renal disease on dialysis Monday, Wednesday, and Friday, started approximately 1 week ago, and hypertension.   SOCIAL HISTORY: Every day tobacco use. Occasional alcohol use. No drug usage.   FAMILY HISTORY: Positive for hypertension. Father w/ MI at 93, passed of a 3rd MI at 63.  ALLERGIES: No known drug allergies.   Physical Exam:  GEN well nourished, thin   HEENT PERRL, hearing intact to voice, periorbital edema   NECK No masses  trachea midline  redundant neck tissue, no appreciable JVD or bruits   RESP normal resp effort  clear BS  no use of accessory muscles   CARD Regular rate and rhythm  Normal, S1, S2  No murmur  Permcath appreciated in R chest wall   ABD denies tenderness  soft  normal BS   EXTR negative cyanosis/clubbing, trace bilateral pretibial edema   SKIN tight to palpation   NEURO follows commands, motor/sensory function intact   PSYCH alert, A+O to time, place, person   Review of  Systems:  Subjective/Chief Complaint shortness of breath   General: Fatigue  Weakness   Respiratory: Short of breath   Cardiovascular: Chest pain or discomfort  Tightness  Dyspnea  Orthopnea  Edema   Review of Systems: All other systems were reviewed and found to be negative   Home Medications: Medication Instructions Status   metoprolol 25 mg oral tablet 1 tab(s) orally 2 times a day Active  spironolactone 25 mg oral tablet 1 tab(s) orally once a day Active  Lasix 80 mg oral tablet 1 tab(s) orally 2 times a day Active   Lab Results:  Hepatic:  16-Feb-15 02:01   Albumin, Serum  2.1  Routine Chem:  16-Feb-15 02:01   B-Type Natriuretic Peptide [facility])  [id number] (Result(s) reported on 29 Oct 2013 at 03:26AM.)  BUN  36  Creatinine (comp)  5.16  Cardiac:  16-Feb-15 02:01   Troponin I  0.07 (0.00-0.05 0.05 ng/mL or less: NEGATIVE  Repeat testing in 3-6 hrs  if clinically indicated. >0.05 ng/mL: POTENTIAL  MYOCARDIAL INJURY. Repeat  testing in 3-6 hrs if  clinically indicated. NOTE: An increase or decrease  of 30% or more on serial  testing suggests a  clinically important change)  CK, Total 161 (39-308 NOTE: NEW REFERENCE RANGE  10/15/2013)  CPK-MB, Serum 3.2 (Result(s) reported on 29 Oct 2013 at 03:06AM.)    05:32   Troponin I  0.63 (0.00-0.05 0.05 ng/mL or less: NEGATIVE  Repeat testing in 3-6 hrs  if clinically indicated. >0.05 ng/mL: POTENTIAL  MYOCARDIAL INJURY. Repeat  testing in 3-6 hrs if  clinically indicated. NOTE: An increase or decrease  of 30% or more on serial  testing suggests a  clinically important change)  CK, Total 163 (39-308 NOTE: NEW REFERENCE RANGE  10/15/2013)  CPK-MB, Serum  5.7 (Result(s) reported on 29 Oct 2013 at [facility].)    09:35   Troponin I  1.30 (0.00-0.05 0.05 ng/mL or less: NEGATIVE  Repeat testing in 3-6 hrs  if clinically indicated. >0.05 ng/mL: POTENTIAL  MYOCARDIAL INJURY. Repeat  testing in 3-6 hrs if  clinically indicated. NOTE: An increase or decrease  of 30% or more on serial  testing suggests a  clinically important change)  Routine UA:  16-Feb-15 04:46   Protein (UA) >=500  Routine Hem:  16-Feb-15 02:01   WBC (CBC)  13.6  RBC (CBC)  2.63  Hemoglobin (CBC)  8.6  Hematocrit (CBC)  26.7  Platelet Count (CBC) 282 (Result(s) reported on  29 Oct 2013 at 11:08AM.)  MCV  102  MCH 32.6  MCHC 32.1  RDW 13.2  Neutrophil % 79.3  Lymphocyte % 10.2  Monocyte % 7.9  Eosinophil % 1.6  Basophil % 1.0  Neutrophil #  10.9  Lymphocyte # 1.4  Monocyte #  1.1  Eosinophil # 0.2  Basophil # 0.1 (Result(s) reported on 29 Oct 2013 at 11:14AM.)   EKG:  Interpretation NSR, diffuse upsloping ST depressions, otherwise no ST/T changes   Radiology Results: Cardiology:    16-Feb-15 07:44, Echo Doppler  Echo Doppler   REASON FOR EXAM:      COMMENTS:       PROCEDURE: [facility] - ECHO DOPPLER COMPLETE(TRANSTHOR)  - Oct 29 2013  7:44AM     RESULT: Echocardiogram Report    Patient Name:   BETTYE NIECE Karel Date of Exam: 10/29/2013  Medical Rec #:  [id number]            Custom1:  Date of Birth:  01/28/1961  Height:       68.9 in  Patient Age:    52 years          Weight:       167.5 lb  Patient Gender: M                 BSA:          1.91 m??    Indications: CHF  Sonographer:    Sherrie Sport RDCS  Referring Phys: Valentino Nose, K    Summary:   1. Left ventricular ejection fraction, by visual estimation, is 60 to   65%.   2. Normal global left ventricular systolic function.   3. Impaired relaxation pattern of LV diastolic filling.   4. Moderate left ventricular hypertrophy.   5. Moderately dilated left atrium.   6. Mild mitral valve regurgitation.   7. Moderate tricuspid regurgitation.   8. Severely increased left ventricular posterior wall thickness.  2D AND M-MODE MEASUREMENTS (normal ranges within parentheses):  Left Ventricle:          Normal  IVSd (2D):      1.63 cm (0.7-1.1)  LVPWd (2D):     1.73 cm (0.7-1.1) Aorta/LA:                  Normal  LVIDd (2D):     4.04 cm (3.4-5.7) Aortic Root (2D): 3.40 cm (2.4-3.7)  LVIDs (2D):     2.87 cm           Left Atrium (2D): 4.60 cm (1.9-4.0)  LV FS (2D):     29.0 %   (>25%)  LV EF (2D):     56.2 %   (>50%)                                    Right Ventricle:                                     RVd (2D):        [id number] cm  LV DIASTOLIC FUNCTION:  MV Peak E: 0.70 m/s E/e' Ratio: 16.30  MV Peak A: 1.00 m/s Decel Time: 239 msec  E/A Ratio: 0.70  SPECTRAL DOPPLER ANALYSIS (where applicable):  Mitral Valve:  MV P1/2 Time: 69.31 msec  MV Area, PHT: 3.17 cm??  Aortic Valve: AoV Max Vel: 1.38 m/s AoV Peak PG: 7.7 mmHg AoV Mean PG:  LVOT Vmax: 1.21 m/s LVOT VTI:  LVOT Diameter: 2.30 cm  AoV Area, Vmax: 3.63 cm?? AoV Area, VTI:  AoV Area, Vmn:  Tricuspid Valve and PA/RV Systolic Pressure: TR Max Velocity: 2.66 m/s RA   Pressure: 5 mmHg RVSP/PASP: 33.2 mmHg  Pulmonic Valve:  PV Max Velocity: 0.98 m/s PV Max PG: 3.8 mmHg PV Mean PG:    PHYSICIAN INTERPRETATION:  Left Ventricle: The left ventricular internal cavity size was normal. LV   posterior wall thickness was severely increased. Moderate left   ventricular hypertrophy. Global LV systolic function was normal. Left   ventricular ejection fraction, by visual estimation, is 60 to 65%.   Spectral Doppler shows impaired relaxation pattern of LV diastolic   filling.  Right Ventricle: The right ventricular size is normal.  Left Atrium: The left atrium is moderately dilated.  Pericardium: There is no evidence of pericardial effusion.  Mitral Valve: Mild mitral valve regurgitation  is seen.  Tricuspid Valve: Moderate tricuspid regurgitation is visualized. The   tricuspid regurgitant velocity is 2.66 m/s, and with an assumed right   atrial pressure of 5 mmHg, the estimated right ventricular systolic   pressure is normal at 33.2 mmHg.  Aortic Valve: The aortic valve is normal.  Aorta: The aortic root is normal in size and structure.    Mayetta MD  Electronically signed by [id number] Isaias Cowman MD  Signature Date/Time: 10/29/2013/8:27:07 AM  *** Final ***    IMPRESSION: .        Verified By: Sheppard Coil . PARASCHOS, M.D., MD    No Known Allergies:   Vital Signs/Nurse's Notes: **Vital Signs.:   16-Feb-15 11:23   Vital Signs Type Routine  Temperature Temperature (F) 99.5  Celsius 37.5  Temperature Source oral  Pulse Pulse 91  Respirations Respirations 18  Systolic BP Systolic BP [id number]  Diastolic BP (mmHg) Diastolic BP (mmHg) 73  Mean BP 91  Pulse Ox % Pulse Ox % 99  Pulse Ox Activity Level  At rest  Oxygen Delivery 2L  *Intake and Output.:   Shift 16-Feb-15 15:00  Grand Totals Intake:  240 Output:      Net:  240 24 Hr.:  240  Oral Intake      In:  240  Length of Stay Totals Intake:  240 Output:      Net:  240    Impression 1. NSTEMI The patient reports a long history of intermittent, substernal, exertional chest pressure. Negative stress testing 5-10 years ago. Reproducible w/ heavy exertion. Admitted after experiencing sudden onset dyspnea w/ 2-3/10 chest discomfort. No radiation. No nausea or diaphoresis. Somewhat atypical in that it was aggravated by deep inspiration. Objectively, EKG shows nonspecific ST changes. Troponin up-trending to 1.30. 2D echo revealed a preserved EF, no WMAs, hypertensive changes (LVH, LAE, diastolic dysfunction, etc).  -- Continue diuresis and BP control today -- Agree w/ risk stratification- check lipid panel -- Start low-dose ASA, continue BB, statin, NTG SL PRN -- ? Benefit of ACEi/ARB w/ ESRD and need for BP control  2. Hypertensive emergency  Flash pulmonary edema on admission. SBP 220s. 127/23 today. -- Continue hydralazine, will need to transition back to oral antihypertensives -- ? benefit of ACEi/ARB (lisinopril held by nephrologist previously). Already progressed to ESRD. Takes spironolactone as OP. Renal consult pending for further recs.  -- Could consider Norvasc, BB w/ alpha activity -- Continue Lasix IV w/ UOP monitoring and HD. Nephrology assistance w/ this.   3. Acute on chronic diastolic CHF Noted on 2D echo. Suspected from longstanding HTN. -- Diuretics and BP control   Plan 4. IgA nephropathy, ESRD on HD. Diagnosed w/ nephrotic  syndrome 02/2013. Follows a nephrologist outpatient. -- Nephrology consult to arrange HD and recs for volume management  5. Hyperlipidemia Lipid panel pending. Underlying nephrotic syndrome.  -- Review lipid panel to guide statin use  6. AOCD Hgb drop from 8.6 to 7.6. Keep Hgb > 8-9. Consider transfusing. -- Check FOBT to r/o GIB  Attending note: The patient was seen and examined. Agree with the above. I modified the note. He presented with hypertensive urgency with dyspnea and chest pain. ECG without acute changes but TnI is elevated. echo with normal EF. Grdual decline in Hgb. He reports blood in stool.  Recommend: Transfuse to keep Hgb >8 given signs of cardiac ischemia.  Cardiac cath tomorrow.  GI consult.   Electronic Signatures: Danella Sensing A (PA-C)  (Signed 16-Feb-15 14:45)  Authored: General Aspect/Present Illness, History and Physical Exam, Review of System, Home Medications, Labs, EKG , Radiology, Allergies, Vital Signs/Nurse's Notes, Impression/Plan Kathlyn Sacramento (MD)  (Signed 16-Feb-15 16:10)  Authored: Impression/Plan  Co-Signer: General Aspect/Present Illness, History and Physical Exam, Review of System, Home Medications, Labs, EKG , Radiology, Allergies, Vital Signs/Nurse's Notes, Impression/Plan   Last Updated: 16-Feb-15 16:10 by Kathlyn Sacramento (MD)

## 2015-01-04 NOTE — Discharge Summary (Signed)
PATIENT NAME:  Daryl Weeks MR#:  [id number] DATE OF BIRTH:  [birth year]  DATE OF ADMISSION:  10/29/2013 DATE OF DISCHARGE:  11/02/2013  DISCHARGE DIAGNOSES:  1.  Non-ST-elevation myocardial infarction.  2.  Accelerated hypertension.  3.  Colon polyps with chronic blood loss anemia.  4.  End-stage renal disease.  5.  Acute-on-chronic diastolic congestive heart failure.  6.  Tobacco abuse.   CONSULTANTS:  1.  Dr. Fletcher Weeks with cardiology.  2.  Dr. Candiss Weeks with nephrology.  3.  Dr. Allen Weeks of GI.   PROCEDURE: Include: 1.  Cardiac catheterization which showed mid RCA 90% and calcified mid LAD 80% stenosis.  2.  EGD which showed no acute abnormalities.  3.  Colonoscopy which found 14 polyps with polypectomy. Biopsy pending.   Hemodialysis was continued.   IMAGING STUDIES: Include a chest x-ray which showed pulmonary edema.   ADMITTING HISTORY AND PHYSICAL: Please see detailed H and P dictated by Dr. Lavetta Weeks. This is a 54 year old Caucasian male patient with history of end-stage renal disease secondary to IgA nephropathy, hypertension, presented to the hospital complaining of shortness of breath. The patient was found to have systolic blood pressure greater than 220 along with pulmonary edema and admitted to the hospitalist service.   The patient was also found to have mildly elevated troponin thought initially to be demand ischemia.   HOSPITAL COURSE: 1.  NSTEMI. The patient had significant bump in his troponin after which cardiology was consulted. The patient was started on a heparin drip, aspirin, statin, beta blocker. The patient was taken to cardiac catheterization which showed RCA and LAD stenosis of 90% and 80%. This was thought to be needing staged PCI but with the patient's history of chronic GI bleed for the past six months, cardiac catheterization was not done as there was high risk for further bleeding once the patient is started on Plavix.  2.  GI bleed. The patient was taken to EGD and  colonoscopy by Dr. Allen Weeks of GI found to have a normal EGD but colonoscopy showed 14 polyps which were taken out. Biopsies pending at this time.  3.  Due to the polypectomy, the patient currently (Dictation Anomaly)<<MISSING TEXT>> started on any anticoagulation presently. He is just on aspirin. Per discussion with Dr. Fletcher Weeks, the patient will follow up with him in five days in his outpatient office and once these polypectomy sites are well healed, the patient will have a cardiac catheterization as once he gets cardiac cath, he will need anticoagulation due to his drug-eluting stents.  4.  Accelerated hypertension. The patient has been started on multiple blood pressure medications. Blood pressure is slowly trending down into the 150s at this time. Further titration of medications by Dr. Fletcher Weeks as outpatient.  5.  End-stage renal disease on hemodialysis. Continue hemodialysis as outpatient. The patient had dialysis in hospital.  6.  Acute-on-chronic diastolic CHF secondary to accelerated hypertension and NSTEMI. This resolved with hemodialysis.   Prior to discharge, the patient does not have any crackles on examination. Does not have any chest pain on ambulation. I have discussed extensively with the patient and his son, also with Dr. Fletcher Weeks. Presently, the patient is a high risk for further NSTEMIs, but considering his polypectomy, he is thought to be high risk for bleeding by cardiology, and the patient is being discharged home to follow up with Dr. Fletcher Weeks. I have requested that he not get involved in any exertional activity, use nitroglycerin p.r.n. for any chest pain and return to  the Emergency Room at first signs of any further chest pain or shortness of breath.   DISCHARGE MEDICATIONS: 1.  Spironolactone  25 mg oral once a day.  2.  Lasix 80 mg oral 2 times a day.  3.  Metoprolol tartrate 100 mg oral 2 times a day.  4.  Losartan 100 mg daily.  5.  Atorvastatin 20 mg daily.  6.  Aspirin 325 mg daily.   7.  Hydralazine 50 mg oral every 8 hours.  8.  Amlodipine 10 mg oral once a day.  9.  Nitroglycerin 0.4 sublingual as needed for chest pain.   DISCHARGE INSTRUCTIONS: Renal diet of regular consistency.   ACTIVITY: No exertional activity.   FOLLOWUP: With Dr. Fletcher Weeks in 5 to 7 days. Primary care physician and Dr. Allen Weeks in 1 to 2 weeks.   Biopsy results to be given by Dr. Allen Weeks at followup.   TIME SPENT ON DAY OF DISCHARGE IN DISCHARGE ACTIVITY: Was 40 minutes.   ____________________________ Daryl Alf Philbert Ocallaghan, MD srs:np D: 11/07/2013 16:40:51 ET T: 11/07/2013 20:09:56 ET JOB#: [id number]  cc: Daryl Heimlich R. Rajvir Ernster, MD, <Dictator>

## 2015-01-04 NOTE — Consult Note (Signed)
CHIEF COMPLAINT and HISTORY:  Subjective/Chief Complaint bacteremia   History of Present Illness Patient admitted over teh weekend for positive blood cultures at the dialysis center.  Had his tx today through his Permcath, and now this needs to be removed.  He is feeling better after a couple of days of abx.   PAST MEDICAL/SURGICAL HISTORY:  Past Medical History:   Hypertension:    Renal Failure:    Denies medical history:    CABG:    Denies surgical history.:   ALLERGIES:  Allergies:  No Known Allergies:   HOME MEDICATIONS:  Home Medications: Medication Instructions Status  nitroglycerin 0.4 mg sublingual tablet 1 tab(s) sublingual prn - for Chest Pain Active  atorvastatin 20 mg oral tablet 1 tab(s) orally once a day (at bedtime) Active  metoprolol tartrate 100 mg oral tablet 1 tab(s) orally every 12 hours Active  hydrALAZINE 50 mg oral tablet 1 tab(s) orally every 8 hours Active  amLODIPine 10 mg oral tablet 1 tab(s) orally once a day Active  amiodarone 200 mg oral tablet 1 tab(s) orally once a day Active  zolpidem 5 mg oral tablet 1 tab(s) orally once a day (at bedtime) Active  furosemide 80 mg oral tablet 1 tab(s) orally 2 times a day Active   Family and Social History:  Family History Non-Contributory   Social History positive  tobacco (Current within 1 year), negative ETOH   Place of Living Home   Review of Systems:  Fever/Chills Yes   Cough No   Sputum No   Abdominal Pain No   Diarrhea No   Constipation No   Nausea/Vomiting No   SOB/DOE No   Chest Pain No   Telemetry Reviewed NSR   Dysuria No   Tolerating PT Yes   Tolerating Diet Yes   Medications/Allergies Reviewed Medications/Allergies reviewed   Physical Exam:  GEN well developed, well nourished   HEENT pink conjunctivae, moist oral mucosa   NECK No masses  trachea midline   RESP normal resp effort  no use of accessory muscles   CARD regular rate  no JVD   VASCULAR ACCESS  Dialysis catheter present  no erythema or drainage around PC   ABD denies tenderness  soft   GU no superpubic tenderness   LYMPH negative neck, negative axillae   EXTR negative cyanosis/clubbing, negative edema   SKIN normal to palpation, skin turgor good   NEURO cranial nerves intact, motor/sensory function intact   PSYCH alert, A+O to time, place, person   LABS:  Laboratory Results: Hepatic:    01-May-15 21:13, Comprehensive Metabolic Panel  Bilirubin, Total 0.3  Alkaline Phosphatase 133  45-117  NOTE: New Reference Range  08/03/13  SGPT (ALT) 29  SGOT (AST) 34  Total Protein, Serum 6.5  Albumin, Serum 2.2    04-May-15 10:51, Renal Function Panel  Albumin, Serum 2.0  TDMs:    04-May-15 10:51, Vancomycin, Trough LAB  Vancomycin, Trough LAB 25  Routine Micro:    01-May-15 22:10, Blood Culture  Micro Text Report   BLOOD CULTURE    COMMENT                   NO GROWTH IN 48 HOURS     ANTIBIOTIC  Culture Comment   NO GROWTH IN 48 HOURS   Result(s) reported on 13 Jan 2014 at 10:00PM.    01-May-15 22:15, Blood Culture  Micro Text Report   BLOOD CULTURE    COMMENT  NO GROWTH IN 48 HOURS     ANTIBIOTIC  Culture Comment   NO GROWTH IN 48 HOURS   Result(s) reported on 13 Jan 2014 at 10:00PM.    02-May-15 05:13, Urine Culture  Organism Name YEAST  Organism Quantity   75,000 CFU/ML  Micro Text Report   URINE CULTURE    ORGANISM 1                75,000 CFU/ML YEAST    COMMENT                   ID TO FOLLOW     ANTIBIOTIC  Specimen Source   CLEAN CATCH  Organism 1   75,000 CFU/ML YEAST  Culture Comment   ID TO FOLLOW   Result(s) reported on 14 Jan 2014 at 09:53AM.  Cardiology:    01-May-15 21:19, ECG  Ventricular Rate 73  Atrial Rate 73  P-R Interval 156  QRS Duration 104  QT 466  QTc 513  P Axis 38  R Axis 22  T Axis 113  ECG interpretation   Normal sinus rhythm  Possible Left atrial enlargement  ST & T wave abnormality, consider  lateral ischemia  Prolonged QT  Abnormal ECG  When compared with ECG of 30-Oct-2013 06:24,  T wave inversion now evident in Lateral leads  ----------unconfirmed----------  Confirmed by OVERREAD, NOT ([id number]), editor PEARSON, BARBARA ([id number]) on 01/14/2014 8:46:33 AM  ECG   Routine Chem:    01-May-15 21:13, Comprehensive Metabolic Panel  Glucose, Serum 141  BUN 12  Creatinine (comp) 3.04  Sodium, Serum 136  Potassium, Serum 2.9  Chloride, Serum 102  CO2, Serum 27  Calcium (Total), Serum 8.1  Osmolality (calc) 274  eGFR (African American) 26  eGFR (Non-African American) 23  eGFR values <57m/min/1.73 m2 may be an indication of chronic  kidney disease (CKD).  Calculated eGFR is useful in patients with stable renal function.  The eGFR calculation will not be reliable in acutely ill patients  when serum creatinine is changing rapidly. It is not useful in   patients on dialysis. The eGFR calculation may not be applicable  to patients at the low and high extremes of body sizes, pregnant  women, and vegetarians.  Anion Gap 7    03-May-15 083:15 Basic Metabolic Panel (w/Total Calcium)  Glucose, Serum 80  BUN 19  Creatinine (comp) 4.45  Sodium, Serum 132  Potassium, Serum 4.0  Chloride, Serum 98  CO2, Serum 28  Calcium (Total), Serum 8.2  Anion Gap 6  Osmolality (calc) 266  eGFR (African American) 17  eGFR (Non-African American) 14  eGFR values <675mmin/1.73 m2 may be an indication of chronic  kidney disease (CKD).  Calculated eGFR is useful in patients with stable renal function.  The eGFR calculation will not be reliable in acutely ill patients  when serum creatinine is changing rapidly. It is not useful in   patients on dialysis. The eGFR calculation may not be applicable  to patients at the low and high extremes of body sizes, pregnant  women, and vegetarians.    04-May-15 10:51, Renal Function Panel  Glucose, Serum 76  BUN 29  Creatinine (comp) 5.69  Sodium, Serum 131   Potassium, Serum 4.1  Chloride, Serum 97  CO2, Serum 29  Calcium (Total), Serum 8.1  Phosphorus, Serum 2.6  Anion Gap 5  Osmolality (calc) 267  eGFR (African American) 12  eGFR (Non-African American) 11  eGFR values <6054min/1.73 m2 may be an indication  of chronic  kidney disease (CKD).  Calculated eGFR is useful in patients with stable renal function.  The eGFR calculation will not be reliable in acutely ill patients  when serum creatinine is changing rapidly. It is not useful in   patients on dialysis. The eGFR calculation may not be applicable  to patients at the low and high extremes of body sizes, pregnant  women, and vegetarians.    04-May-15 10:51, Vancomycin, Trough LAB  Result Comment   TROUGH,VANC - RESULTS VERIFIED BY REPEAT TESTING.   - NOTIFIED OF CRITICAL VALUE   - NOTIFIED HANNAH IN PHARMACY AT 4098   - ON 01/14/14.Marland KitchenMarland KitchenMelwood   - READ-BACK PROCESS PERFORMED.   Result(s) reported on 14 Jan 2014 at 01:08PM.  Routine UA:    02-May-15 05:13, Urinalysis  Color (UA) Amber  Clarity (UA) Cloudy  Glucose (UA) 50 mg/dL  Bilirubin (UA) Negative  Ketones (UA) Trace  Specific Gravity (UA) 1.028  Blood (UA) 1+  pH (UA) 5.0  Protein (UA) >=500  Nitrite (UA) Negative  Leukocyte Esterase (UA) 3+  Result(s) reported on 12 Jan 2014 at 08:41AM.  RBC (UA) 50 /HPF  WBC (UA)   1045 /HPF  Bacteria (UA) 1+  Epithelial Cells (UA) 4 /HPF  WBC Clump (UA) PRESENT  Budding Yeast (UA) PRESENT  Hyaline Cast (UA) 5 /LPF  Result(s) reported on 12 Jan 2014 at 08:41AM.  Routine Hem:    01-May-15 21:13, Hemogram, Platelet Count  WBC (CBC) 16.4  RBC (CBC) 3.65  Hemoglobin (CBC) 11.1  Hematocrit (CBC) 35.3  Platelet Count (CBC) 229  Result(s) reported on 11 Jan 2014 at 10:26PM.  MCV 97  MCH 30.5  MCHC 31.6  RDW 20.5    03-May-15 05:25, CBC Profile  WBC (CBC) 7.7  RBC (CBC) 3.10  Hemoglobin (CBC) 9.8  Hematocrit (CBC) 30.1  Platelet Count (CBC) 205  MCV 97  MCH 31.7  MCHC 32.5   RDW 20.1  Neutrophil % 64.1  Lymphocyte % 17.4  Monocyte % 15.4  Eosinophil % 2.1  Basophil % 1.0  Neutrophil # 4.9  Lymphocyte # 1.3  Monocyte # 1.2  Eosinophil # 0.2  Basophil # 0.1  Result(s) reported on 13 Jan 2014 at 06:43AM.   RADIOLOGY:  Radiology Results: XRay:    16-Feb-15 02:14, Chest Portable Single View  Chest Portable Single View  REASON FOR EXAM:    Chest pain  COMMENTS:       PROCEDURE: DXR - DXR PORTABLE CHEST SINGLE VIEW  - Oct 29 2013  2:14AM     CLINICAL DATA:  Chest pain    EXAM:  PORTABLE CHEST - 1 VIEW    COMPARISON:  Prior radiograph from 10/22/2013    FINDINGS:  Right-sided hemodialysis catheter is stable in position.  Cardiomegaly is unchanged.  Lungs are normally inflated. Diffuse indistinctness of these  pulmonary interstitial markings is present with scattered Kerley  B-lines, compatible with moderate diffuse interstitial edema. No  focal infiltrate. No pleural effusion. No pneumothorax.    Osseous structures are within normal limits.     IMPRESSION:  Stable cardiomegaly with mild-to-moderate diffuse interstitial  edema.      Electronically Signed    [id number]  McClintock M.D.    On: 10/29/2013 02:19     Verified By: Neomia Glass, M.D.,    30-Apr-15 16:00, Chest PA and Lateral  Chest PA and Lateral  REASON FOR EXAM:    chf,htn  COMMENTS:       PROCEDURE: DXR -  DXR CHEST PA (OR AP) AND LATERAL  - Jan 10 2014  4:00PM     CLINICAL DATA:  Hypertension.  Preop.    EXAM:  CHEST  2 VIEW    COMPARISON:  12/26/2013    FINDINGS:  Changes from CABG surgeryare stable. Cardiac silhouette is normal  in size. Normal mediastinal and hilar contours.  Left basilar atelectasis has further improved. Lungs are otherwise  clear. No pleural effusion. No pneumothorax.    Right internal jugular dual-lumen central venous catheter is stable  with its distal tip at the caval atrial junction.    Bony thorax is demineralized but  intact.     IMPRESSION:  No acute cardiopulmonary disease.    Further improvement in left lung base atelectasis.      Electronically Signed    By: Lajean Manes M.D.    On: 01/10/2014 16:08         Verified By: Lasandra Beech, M.D.,    02-May-15 10:25, Chest Portable Single View  Chest Portable Single View  REASON FOR EXAM:    sob  COMMENTS:       PROCEDURE: DXR - DXR PORTABLE CHEST SINGLE VIEW  - Jan 12 2014 10:25AM     CLINICAL DATA:  Shortness of breath    EXAM:  PORTABLE CHEST - 1 VIEW    COMPARISON:  DG CHEST 2V dated 01/10/2014; DG CHEST 2 VIEW dated  12/04/2013    FINDINGS:  Grossly unchanged enlarged cardiac silhouette and mediastinal  contours post median sternotomy and CABG. Stable positioning of  support apparatus. Grossly unchanged left basilar linear  heterogeneous opacities favored to represent subsegmental  atelectasis or scar. This finding is associated with minimal  elevation of left hemidiaphragm. Trace bilateral pleural effusions  are unchanged. Pulmonary venous congestion without frank evidence of  edema. Unchanged bones     IMPRESSION:  1. Cardiomegaly without acute cardiopulmonary disease. Specifically,  no evidence of edema.  2. Grossly unchanged findings of pulmonary venous congestion, trace  bilateral effusions and left basilar atelectasis and volume loss.    Electronically Signed    By: Sandi Mariscal M.D.    On: 01/12/2014 10:56         Verified By: Aileen Fass, M.D.,  [place]:    06-Jun-14 11:49, US Guide Needle Biopsy Other Than Breast  US Guide Needle Biopsy Other Than Breast  REASON FOR EXAM:    arf, proteinuria nephrotic range  COMMENTS:       PROCEDURE: [place]  - US GUIDED BX/ASPIRATION NOT BR  - Feb 16 2013 11:49AM     RESULT: History: Renal failure.    Findings: Renal ultrasound reveals a needle projected over the left   kidney. Post renal biopsy reveals the possibility of a minimal amount of   fluid adjacent to the left kidney.  No large hematoma noted.    IMPRESSION:  Renal ultrasound performed pre- and postbiopsy left kidney   with findings as above.      Verified By: Osa Craver, M.D., MD  LabUnknown:  PACS Image    16-Feb-15 02:14, Chest Portable Single View  PACS Image    30-Apr-15 16:00, Chest PA and Lateral  PACS Image    02-May-15 10:25, Chest Portable Single View  PACS Image   ASSESSMENT AND PLAN:  Assessment/Admission Diagnosis ESRD bacteremia with permcath in place as likely source   Plan permcath removed after dialysis without difficulty. Will await clearance of cultures.  If tomorrow cultures  are negative for 48 hours, then may be able to replace [facility] Thursday.  If not, will need temp cath in 2-3 days to bridge to new Permcath   level 3   Electronic Signatures: [name] (MD)  (Signed 04-May-15 17:07)  Authored: Chief Complaint and History, PAST MEDICAL/SURGICAL HISTORY, ALLERGIES, HOME MEDICATIONS, Family and Social History, Review of Systems, Physical Exam, LABS, RADIOLOGY, Assessment and Plan   Last Updated: 04-May-15 17:07 by [name] (MD)

## 2015-01-04 NOTE — Op Note (Signed)
PATIENT NAME:  Weeks, Daryl MR#:  [id number] DATE OF BIRTH:  08-25-1961  DATE OF PROCEDURE:  02/15/2014  PREOPERATIVE DIAGNOSIS:  End-stage renal disease requiring hemodialysis.   POSTOPERATIVE DIAGNOSIS:  End-stage renal disease requiring hemodialysis.   PROCEDURE PERFORMED:  Creation of a left forearm basilic transposition for AV access.   SURGEON:  Katha Cabal, M.D.   FIRST ASSISTANT:  Ms. Melvyn Neth, [place].   ANESTHESIA:  General by LMA.   FLUIDS:  Per anesthesia record.   ESTIMATED BLOOD LOSS:  100 mL.   SPECIMEN:  None.   INDICATIONS:  Daryl Weeks is a 54 year old gentleman who is now on hemodialysis and therefore requires creation of an upper extremity access.  Risks and benefits were reviewed.  All questions are answered.  The patient agrees to proceed.   DESCRIPTION OF PROCEDURE:  The patient is taken to the Operating Room and placed in the supine position.  After adequate general anesthesia is induced, appropriate invasive monitors are placed, he is positioned supine and his left arm is extended palm upward and prepped and draped in a sterile fashion.  Appropriate timeout is called.   Upon inducing general anesthesia, a very large basilic vein in the forearm is identified.  It is therefore elected rather than create an upper extremity AV access, to perform a transposition of the basilic vein in the forearm.  Through a series of ladder incisions overlying the basilic vein, the vein is dissected circumferentially, branches are ligated between 3-0 silk ties.  The vein is then marked with a surgical marker.  It is dissected circumferentially all the way to the level of the elbow to the level of the antecubital crease.  It is then transected distally by the wrist after ligating the vein with 3-0 silk tie.  The vein is then delivered from these incisions, dilated with Donna Christen coronary dilators and then irrigated with heparinized saline.  The vein measures approximately 4 mm in  diameter.  It is then approximated over the radial artery and a site for anastomosis is selected.  A linear incision is made overlying the radial artery.  Dissection is carried down through the soft tissues and the artery is looped proximally and distally.  Two small transverse incisions are created along the path of the presumed fistula and the vein is subsequently pulled subcutaneously.  This irrigated easily and flushed to ensure that it has not been twisted.   After controlling the artery with Silastic vessel loops, arteriotomy is made.  The 6-0 Prolene stay sutures are placed and the vein spatulated and an end vein to side radial artery anastomosis is fashioned with running 6-0 Prolene.  Flushing maneuvers are performed and flow was established first to the fistula and then distally to the wrist and hand.  The vein immediately distends and has a wonderful thrill to it.  Upon light compression more proximally at the elbow there is a strong impulse.   All wounds are then irrigated and closed in two layers using running 3-0 Vicryl, followed by 4-0 Monocryl subcuticular and then Dermabond.  The patient tolerated the procedure well.  There were no immediate complications.  Sponge and needle counts are correct.  He was taken to the recovery room in excellent condition.    ____________________________ Katha Cabal, MD ggs:ea D: 02/15/2014 19:07:52 ET T: 02/16/2014 03:58:12 ET JOB#: [id number]  cc: Katha Cabal, MD, <Dictator> Mamie Levers, MD Katha Cabal, MD Katha Cabal MD ELECTRONICALLY SIGNED 02/26/2014 9:18

## 2015-01-04 NOTE — Op Note (Signed)
PATIENT NAME:  Daryl Weeks, Daryl Weeks MR#:  [id number] DATE OF BIRTH:  [birth year]  DATE OF PROCEDURE:  01/14/2014  PREOPERATIVE DIAGNOSES:  1. End-stage renal disease. 2. Bacteremia, with PermCath in place.  3. Coronary disease.  4. Hypertension.   POSTOPERATIVE DIAGNOSES:  1. End-stage renal disease. 2. Bacteremia, with PermCath in place.  3. Coronary disease.  4. Hypertension.   PROCEDURE: Removal of right jugular PermCath.   SURGEON: [name], MD   ANESTHESIA: Local.   ESTIMATED BLOOD LOSS: Minimal.   INDICATION FOR PROCEDURE: This is a 54 year old gentleman who is admitted with bacteremia. He has a PermCath in place that was placed about 3 months ago. He is now bacteremic and his PermCath will need to be removed. Risks and benefits were discussed. Informed consent was obtained.   DESCRIPTION OF PROCEDURE: The patient is laid flat in his floor bed. The right chest and existing catheter were sterilely prepped and draped and a sterile surgical field was created. The area was anesthetized with 1% lidocaine. I then dissected out the cuff, which was very near the skin and freed it from the surrounding scar tissue. The catheter was then removed in its entirety without difficulty with gentle pressure. Pressure was held. Sterile dressing was placed. The patient tolerated the procedure well and was taken to the recovery room in stable condition.      ___________________________ [name], MD jsd:dd D: 01/14/2014 16:27:09 ET T: 01/15/2014 03:04:18 ET JOB#: [id number]  cc: [name], MD, <Dictator> [name] MD ELECTRONICALLY SIGNED 01/17/2014 14:25

## 2015-01-12 DIAGNOSIS — N186 End stage renal disease: Secondary | ICD-10-CM | POA: Diagnosis not present

## 2015-01-12 DIAGNOSIS — Z992 Dependence on renal dialysis: Secondary | ICD-10-CM | POA: Diagnosis not present

## 2015-01-13 DIAGNOSIS — D631 Anemia in chronic kidney disease: Secondary | ICD-10-CM | POA: Diagnosis not present

## 2015-01-13 DIAGNOSIS — Z992 Dependence on renal dialysis: Secondary | ICD-10-CM | POA: Diagnosis not present

## 2015-01-13 DIAGNOSIS — D509 Iron deficiency anemia, unspecified: Secondary | ICD-10-CM | POA: Diagnosis not present

## 2015-01-13 DIAGNOSIS — N2581 Secondary hyperparathyroidism of renal origin: Secondary | ICD-10-CM | POA: Diagnosis not present

## 2015-01-13 DIAGNOSIS — N186 End stage renal disease: Secondary | ICD-10-CM | POA: Diagnosis not present

## 2015-01-23 DIAGNOSIS — N2889 Other specified disorders of kidney and ureter: Secondary | ICD-10-CM | POA: Diagnosis not present

## 2015-02-12 DIAGNOSIS — D509 Iron deficiency anemia, unspecified: Secondary | ICD-10-CM | POA: Diagnosis not present

## 2015-02-12 DIAGNOSIS — N186 End stage renal disease: Secondary | ICD-10-CM | POA: Diagnosis not present

## 2015-02-12 DIAGNOSIS — N2581 Secondary hyperparathyroidism of renal origin: Secondary | ICD-10-CM | POA: Diagnosis not present

## 2015-02-12 DIAGNOSIS — D631 Anemia in chronic kidney disease: Secondary | ICD-10-CM | POA: Diagnosis not present

## 2015-02-12 DIAGNOSIS — Z992 Dependence on renal dialysis: Secondary | ICD-10-CM | POA: Diagnosis not present

## 2015-02-14 DIAGNOSIS — D509 Iron deficiency anemia, unspecified: Secondary | ICD-10-CM | POA: Diagnosis not present

## 2015-02-14 DIAGNOSIS — N186 End stage renal disease: Secondary | ICD-10-CM | POA: Diagnosis not present

## 2015-02-14 DIAGNOSIS — N2581 Secondary hyperparathyroidism of renal origin: Secondary | ICD-10-CM | POA: Diagnosis not present

## 2015-02-14 DIAGNOSIS — D631 Anemia in chronic kidney disease: Secondary | ICD-10-CM | POA: Diagnosis not present

## 2015-02-14 DIAGNOSIS — Z992 Dependence on renal dialysis: Secondary | ICD-10-CM | POA: Diagnosis not present

## 2015-02-17 DIAGNOSIS — N2581 Secondary hyperparathyroidism of renal origin: Secondary | ICD-10-CM | POA: Diagnosis not present

## 2015-02-17 DIAGNOSIS — D631 Anemia in chronic kidney disease: Secondary | ICD-10-CM | POA: Diagnosis not present

## 2015-02-17 DIAGNOSIS — D509 Iron deficiency anemia, unspecified: Secondary | ICD-10-CM | POA: Diagnosis not present

## 2015-02-17 DIAGNOSIS — Z992 Dependence on renal dialysis: Secondary | ICD-10-CM | POA: Diagnosis not present

## 2015-02-17 DIAGNOSIS — N186 End stage renal disease: Secondary | ICD-10-CM | POA: Diagnosis not present

## 2015-02-19 DIAGNOSIS — N2581 Secondary hyperparathyroidism of renal origin: Secondary | ICD-10-CM | POA: Diagnosis not present

## 2015-02-19 DIAGNOSIS — N186 End stage renal disease: Secondary | ICD-10-CM | POA: Diagnosis not present

## 2015-02-19 DIAGNOSIS — D631 Anemia in chronic kidney disease: Secondary | ICD-10-CM | POA: Diagnosis not present

## 2015-02-19 DIAGNOSIS — Z992 Dependence on renal dialysis: Secondary | ICD-10-CM | POA: Diagnosis not present

## 2015-02-19 DIAGNOSIS — D509 Iron deficiency anemia, unspecified: Secondary | ICD-10-CM | POA: Diagnosis not present

## 2015-02-21 DIAGNOSIS — N186 End stage renal disease: Secondary | ICD-10-CM | POA: Diagnosis not present

## 2015-02-21 DIAGNOSIS — D631 Anemia in chronic kidney disease: Secondary | ICD-10-CM | POA: Diagnosis not present

## 2015-02-21 DIAGNOSIS — N2581 Secondary hyperparathyroidism of renal origin: Secondary | ICD-10-CM | POA: Diagnosis not present

## 2015-02-21 DIAGNOSIS — Z992 Dependence on renal dialysis: Secondary | ICD-10-CM | POA: Diagnosis not present

## 2015-02-21 DIAGNOSIS — D509 Iron deficiency anemia, unspecified: Secondary | ICD-10-CM | POA: Diagnosis not present

## 2015-02-24 DIAGNOSIS — D509 Iron deficiency anemia, unspecified: Secondary | ICD-10-CM | POA: Diagnosis not present

## 2015-02-24 DIAGNOSIS — N2581 Secondary hyperparathyroidism of renal origin: Secondary | ICD-10-CM | POA: Diagnosis not present

## 2015-02-24 DIAGNOSIS — N186 End stage renal disease: Secondary | ICD-10-CM | POA: Diagnosis not present

## 2015-02-24 DIAGNOSIS — Z992 Dependence on renal dialysis: Secondary | ICD-10-CM | POA: Diagnosis not present

## 2015-02-24 DIAGNOSIS — D631 Anemia in chronic kidney disease: Secondary | ICD-10-CM | POA: Diagnosis not present

## 2015-02-26 DIAGNOSIS — D631 Anemia in chronic kidney disease: Secondary | ICD-10-CM | POA: Diagnosis not present

## 2015-02-26 DIAGNOSIS — N2581 Secondary hyperparathyroidism of renal origin: Secondary | ICD-10-CM | POA: Diagnosis not present

## 2015-02-26 DIAGNOSIS — N186 End stage renal disease: Secondary | ICD-10-CM | POA: Diagnosis not present

## 2015-02-26 DIAGNOSIS — D509 Iron deficiency anemia, unspecified: Secondary | ICD-10-CM | POA: Diagnosis not present

## 2015-02-26 DIAGNOSIS — Z992 Dependence on renal dialysis: Secondary | ICD-10-CM | POA: Diagnosis not present

## 2015-02-28 DIAGNOSIS — D631 Anemia in chronic kidney disease: Secondary | ICD-10-CM | POA: Diagnosis not present

## 2015-02-28 DIAGNOSIS — N186 End stage renal disease: Secondary | ICD-10-CM | POA: Diagnosis not present

## 2015-02-28 DIAGNOSIS — Z992 Dependence on renal dialysis: Secondary | ICD-10-CM | POA: Diagnosis not present

## 2015-02-28 DIAGNOSIS — D509 Iron deficiency anemia, unspecified: Secondary | ICD-10-CM | POA: Diagnosis not present

## 2015-02-28 DIAGNOSIS — N2581 Secondary hyperparathyroidism of renal origin: Secondary | ICD-10-CM | POA: Diagnosis not present

## 2015-03-03 DIAGNOSIS — D509 Iron deficiency anemia, unspecified: Secondary | ICD-10-CM | POA: Diagnosis not present

## 2015-03-03 DIAGNOSIS — N2581 Secondary hyperparathyroidism of renal origin: Secondary | ICD-10-CM | POA: Diagnosis not present

## 2015-03-03 DIAGNOSIS — D631 Anemia in chronic kidney disease: Secondary | ICD-10-CM | POA: Diagnosis not present

## 2015-03-03 DIAGNOSIS — Z992 Dependence on renal dialysis: Secondary | ICD-10-CM | POA: Diagnosis not present

## 2015-03-03 DIAGNOSIS — N186 End stage renal disease: Secondary | ICD-10-CM | POA: Diagnosis not present

## 2015-03-05 DIAGNOSIS — N2581 Secondary hyperparathyroidism of renal origin: Secondary | ICD-10-CM | POA: Diagnosis not present

## 2015-03-05 DIAGNOSIS — D631 Anemia in chronic kidney disease: Secondary | ICD-10-CM | POA: Diagnosis not present

## 2015-03-05 DIAGNOSIS — Z992 Dependence on renal dialysis: Secondary | ICD-10-CM | POA: Diagnosis not present

## 2015-03-05 DIAGNOSIS — N186 End stage renal disease: Secondary | ICD-10-CM | POA: Diagnosis not present

## 2015-03-05 DIAGNOSIS — D509 Iron deficiency anemia, unspecified: Secondary | ICD-10-CM | POA: Diagnosis not present

## 2015-03-07 DIAGNOSIS — N186 End stage renal disease: Secondary | ICD-10-CM | POA: Diagnosis not present

## 2015-03-07 DIAGNOSIS — N2581 Secondary hyperparathyroidism of renal origin: Secondary | ICD-10-CM | POA: Diagnosis not present

## 2015-03-07 DIAGNOSIS — D509 Iron deficiency anemia, unspecified: Secondary | ICD-10-CM | POA: Diagnosis not present

## 2015-03-07 DIAGNOSIS — Z992 Dependence on renal dialysis: Secondary | ICD-10-CM | POA: Diagnosis not present

## 2015-03-07 DIAGNOSIS — D631 Anemia in chronic kidney disease: Secondary | ICD-10-CM | POA: Diagnosis not present

## 2015-03-10 DIAGNOSIS — N186 End stage renal disease: Secondary | ICD-10-CM | POA: Diagnosis not present

## 2015-03-10 DIAGNOSIS — D631 Anemia in chronic kidney disease: Secondary | ICD-10-CM | POA: Diagnosis not present

## 2015-03-10 DIAGNOSIS — Z992 Dependence on renal dialysis: Secondary | ICD-10-CM | POA: Diagnosis not present

## 2015-03-10 DIAGNOSIS — D509 Iron deficiency anemia, unspecified: Secondary | ICD-10-CM | POA: Diagnosis not present

## 2015-03-10 DIAGNOSIS — N2581 Secondary hyperparathyroidism of renal origin: Secondary | ICD-10-CM | POA: Diagnosis not present

## 2015-03-12 DIAGNOSIS — D631 Anemia in chronic kidney disease: Secondary | ICD-10-CM | POA: Diagnosis not present

## 2015-03-12 DIAGNOSIS — N2581 Secondary hyperparathyroidism of renal origin: Secondary | ICD-10-CM | POA: Diagnosis not present

## 2015-03-12 DIAGNOSIS — Z992 Dependence on renal dialysis: Secondary | ICD-10-CM | POA: Diagnosis not present

## 2015-03-12 DIAGNOSIS — N186 End stage renal disease: Secondary | ICD-10-CM | POA: Diagnosis not present

## 2015-03-12 DIAGNOSIS — D509 Iron deficiency anemia, unspecified: Secondary | ICD-10-CM | POA: Diagnosis not present

## 2015-03-14 DIAGNOSIS — N2581 Secondary hyperparathyroidism of renal origin: Secondary | ICD-10-CM | POA: Diagnosis not present

## 2015-03-14 DIAGNOSIS — D509 Iron deficiency anemia, unspecified: Secondary | ICD-10-CM | POA: Diagnosis not present

## 2015-03-14 DIAGNOSIS — Z992 Dependence on renal dialysis: Secondary | ICD-10-CM | POA: Diagnosis not present

## 2015-03-14 DIAGNOSIS — N186 End stage renal disease: Secondary | ICD-10-CM | POA: Diagnosis not present

## 2015-03-17 DIAGNOSIS — N2581 Secondary hyperparathyroidism of renal origin: Secondary | ICD-10-CM | POA: Diagnosis not present

## 2015-03-17 DIAGNOSIS — D509 Iron deficiency anemia, unspecified: Secondary | ICD-10-CM | POA: Diagnosis not present

## 2015-03-17 DIAGNOSIS — Z992 Dependence on renal dialysis: Secondary | ICD-10-CM | POA: Diagnosis not present

## 2015-03-17 DIAGNOSIS — N186 End stage renal disease: Secondary | ICD-10-CM | POA: Diagnosis not present

## 2015-03-19 DIAGNOSIS — N186 End stage renal disease: Secondary | ICD-10-CM | POA: Diagnosis not present

## 2015-03-19 DIAGNOSIS — Z992 Dependence on renal dialysis: Secondary | ICD-10-CM | POA: Diagnosis not present

## 2015-03-19 DIAGNOSIS — D509 Iron deficiency anemia, unspecified: Secondary | ICD-10-CM | POA: Diagnosis not present

## 2015-03-19 DIAGNOSIS — N2581 Secondary hyperparathyroidism of renal origin: Secondary | ICD-10-CM | POA: Diagnosis not present

## 2015-03-21 DIAGNOSIS — N2581 Secondary hyperparathyroidism of renal origin: Secondary | ICD-10-CM | POA: Diagnosis not present

## 2015-03-21 DIAGNOSIS — D509 Iron deficiency anemia, unspecified: Secondary | ICD-10-CM | POA: Diagnosis not present

## 2015-03-21 DIAGNOSIS — N186 End stage renal disease: Secondary | ICD-10-CM | POA: Diagnosis not present

## 2015-03-21 DIAGNOSIS — Z992 Dependence on renal dialysis: Secondary | ICD-10-CM | POA: Diagnosis not present

## 2015-03-24 DIAGNOSIS — D509 Iron deficiency anemia, unspecified: Secondary | ICD-10-CM | POA: Diagnosis not present

## 2015-03-24 DIAGNOSIS — N186 End stage renal disease: Secondary | ICD-10-CM | POA: Diagnosis not present

## 2015-03-24 DIAGNOSIS — N2581 Secondary hyperparathyroidism of renal origin: Secondary | ICD-10-CM | POA: Diagnosis not present

## 2015-03-24 DIAGNOSIS — Z992 Dependence on renal dialysis: Secondary | ICD-10-CM | POA: Diagnosis not present

## 2015-03-26 DIAGNOSIS — N186 End stage renal disease: Secondary | ICD-10-CM | POA: Diagnosis not present

## 2015-03-26 DIAGNOSIS — Z992 Dependence on renal dialysis: Secondary | ICD-10-CM | POA: Diagnosis not present

## 2015-03-26 DIAGNOSIS — N2581 Secondary hyperparathyroidism of renal origin: Secondary | ICD-10-CM | POA: Diagnosis not present

## 2015-03-26 DIAGNOSIS — D509 Iron deficiency anemia, unspecified: Secondary | ICD-10-CM | POA: Diagnosis not present

## 2015-03-28 DIAGNOSIS — N2581 Secondary hyperparathyroidism of renal origin: Secondary | ICD-10-CM | POA: Diagnosis not present

## 2015-03-28 DIAGNOSIS — D509 Iron deficiency anemia, unspecified: Secondary | ICD-10-CM | POA: Diagnosis not present

## 2015-03-28 DIAGNOSIS — Z992 Dependence on renal dialysis: Secondary | ICD-10-CM | POA: Diagnosis not present

## 2015-03-28 DIAGNOSIS — N186 End stage renal disease: Secondary | ICD-10-CM | POA: Diagnosis not present

## 2015-03-31 DIAGNOSIS — N186 End stage renal disease: Secondary | ICD-10-CM | POA: Diagnosis not present

## 2015-03-31 DIAGNOSIS — Z992 Dependence on renal dialysis: Secondary | ICD-10-CM | POA: Diagnosis not present

## 2015-03-31 DIAGNOSIS — N2581 Secondary hyperparathyroidism of renal origin: Secondary | ICD-10-CM | POA: Diagnosis not present

## 2015-03-31 DIAGNOSIS — D509 Iron deficiency anemia, unspecified: Secondary | ICD-10-CM | POA: Diagnosis not present

## 2015-04-02 DIAGNOSIS — N2581 Secondary hyperparathyroidism of renal origin: Secondary | ICD-10-CM | POA: Diagnosis not present

## 2015-04-02 DIAGNOSIS — N186 End stage renal disease: Secondary | ICD-10-CM | POA: Diagnosis not present

## 2015-04-02 DIAGNOSIS — Z992 Dependence on renal dialysis: Secondary | ICD-10-CM | POA: Diagnosis not present

## 2015-04-02 DIAGNOSIS — D509 Iron deficiency anemia, unspecified: Secondary | ICD-10-CM | POA: Diagnosis not present

## 2015-04-04 DIAGNOSIS — Z992 Dependence on renal dialysis: Secondary | ICD-10-CM | POA: Diagnosis not present

## 2015-04-04 DIAGNOSIS — D509 Iron deficiency anemia, unspecified: Secondary | ICD-10-CM | POA: Diagnosis not present

## 2015-04-04 DIAGNOSIS — N2581 Secondary hyperparathyroidism of renal origin: Secondary | ICD-10-CM | POA: Diagnosis not present

## 2015-04-04 DIAGNOSIS — N186 End stage renal disease: Secondary | ICD-10-CM | POA: Diagnosis not present

## 2015-04-07 DIAGNOSIS — D509 Iron deficiency anemia, unspecified: Secondary | ICD-10-CM | POA: Diagnosis not present

## 2015-04-07 DIAGNOSIS — N186 End stage renal disease: Secondary | ICD-10-CM | POA: Diagnosis not present

## 2015-04-07 DIAGNOSIS — N2581 Secondary hyperparathyroidism of renal origin: Secondary | ICD-10-CM | POA: Diagnosis not present

## 2015-04-07 DIAGNOSIS — Z992 Dependence on renal dialysis: Secondary | ICD-10-CM | POA: Diagnosis not present

## 2015-04-09 DIAGNOSIS — N2581 Secondary hyperparathyroidism of renal origin: Secondary | ICD-10-CM | POA: Diagnosis not present

## 2015-04-09 DIAGNOSIS — D509 Iron deficiency anemia, unspecified: Secondary | ICD-10-CM | POA: Diagnosis not present

## 2015-04-09 DIAGNOSIS — Z992 Dependence on renal dialysis: Secondary | ICD-10-CM | POA: Diagnosis not present

## 2015-04-09 DIAGNOSIS — N186 End stage renal disease: Secondary | ICD-10-CM | POA: Diagnosis not present

## 2015-04-11 DIAGNOSIS — N2581 Secondary hyperparathyroidism of renal origin: Secondary | ICD-10-CM | POA: Diagnosis not present

## 2015-04-11 DIAGNOSIS — Z992 Dependence on renal dialysis: Secondary | ICD-10-CM | POA: Diagnosis not present

## 2015-04-11 DIAGNOSIS — N186 End stage renal disease: Secondary | ICD-10-CM | POA: Diagnosis not present

## 2015-04-11 DIAGNOSIS — D509 Iron deficiency anemia, unspecified: Secondary | ICD-10-CM | POA: Diagnosis not present

## 2015-04-14 DIAGNOSIS — D509 Iron deficiency anemia, unspecified: Secondary | ICD-10-CM | POA: Diagnosis not present

## 2015-04-14 DIAGNOSIS — Z992 Dependence on renal dialysis: Secondary | ICD-10-CM | POA: Diagnosis not present

## 2015-04-14 DIAGNOSIS — N2581 Secondary hyperparathyroidism of renal origin: Secondary | ICD-10-CM | POA: Diagnosis not present

## 2015-04-14 DIAGNOSIS — N186 End stage renal disease: Secondary | ICD-10-CM | POA: Diagnosis not present

## 2015-05-08 DIAGNOSIS — T82318A Breakdown (mechanical) of other vascular grafts, initial encounter: Secondary | ICD-10-CM | POA: Diagnosis not present

## 2015-05-08 DIAGNOSIS — I10 Essential (primary) hypertension: Secondary | ICD-10-CM | POA: Diagnosis not present

## 2015-05-08 DIAGNOSIS — Y841 Kidney dialysis as the cause of abnormal reaction of the patient, or of later complication, without mention of misadventure at the time of the procedure: Secondary | ICD-10-CM | POA: Diagnosis not present

## 2015-05-08 DIAGNOSIS — N186 End stage renal disease: Secondary | ICD-10-CM | POA: Diagnosis not present

## 2015-05-08 DIAGNOSIS — Z992 Dependence on renal dialysis: Secondary | ICD-10-CM | POA: Diagnosis not present

## 2015-05-08 DIAGNOSIS — F17200 Nicotine dependence, unspecified, uncomplicated: Secondary | ICD-10-CM | POA: Diagnosis not present

## 2015-05-08 DIAGNOSIS — E785 Hyperlipidemia, unspecified: Secondary | ICD-10-CM | POA: Diagnosis not present

## 2015-05-15 DIAGNOSIS — Z992 Dependence on renal dialysis: Secondary | ICD-10-CM | POA: Diagnosis not present

## 2015-05-15 DIAGNOSIS — N186 End stage renal disease: Secondary | ICD-10-CM | POA: Diagnosis not present

## 2015-05-16 DIAGNOSIS — N186 End stage renal disease: Secondary | ICD-10-CM | POA: Diagnosis not present

## 2015-05-16 DIAGNOSIS — Z23 Encounter for immunization: Secondary | ICD-10-CM | POA: Diagnosis not present

## 2015-05-16 DIAGNOSIS — D631 Anemia in chronic kidney disease: Secondary | ICD-10-CM | POA: Diagnosis not present

## 2015-05-16 DIAGNOSIS — Z992 Dependence on renal dialysis: Secondary | ICD-10-CM | POA: Diagnosis not present

## 2015-05-16 DIAGNOSIS — N2581 Secondary hyperparathyroidism of renal origin: Secondary | ICD-10-CM | POA: Diagnosis not present

## 2015-05-16 DIAGNOSIS — D509 Iron deficiency anemia, unspecified: Secondary | ICD-10-CM | POA: Diagnosis not present

## 2015-05-19 DIAGNOSIS — Z992 Dependence on renal dialysis: Secondary | ICD-10-CM | POA: Diagnosis not present

## 2015-05-19 DIAGNOSIS — N186 End stage renal disease: Secondary | ICD-10-CM | POA: Diagnosis not present

## 2015-05-19 DIAGNOSIS — D631 Anemia in chronic kidney disease: Secondary | ICD-10-CM | POA: Diagnosis not present

## 2015-05-19 DIAGNOSIS — D509 Iron deficiency anemia, unspecified: Secondary | ICD-10-CM | POA: Diagnosis not present

## 2015-05-19 DIAGNOSIS — N2581 Secondary hyperparathyroidism of renal origin: Secondary | ICD-10-CM | POA: Diagnosis not present

## 2015-05-19 DIAGNOSIS — Z23 Encounter for immunization: Secondary | ICD-10-CM | POA: Diagnosis not present

## 2015-05-21 DIAGNOSIS — N2581 Secondary hyperparathyroidism of renal origin: Secondary | ICD-10-CM | POA: Diagnosis not present

## 2015-05-21 DIAGNOSIS — Z23 Encounter for immunization: Secondary | ICD-10-CM | POA: Diagnosis not present

## 2015-05-21 DIAGNOSIS — D509 Iron deficiency anemia, unspecified: Secondary | ICD-10-CM | POA: Diagnosis not present

## 2015-05-21 DIAGNOSIS — N186 End stage renal disease: Secondary | ICD-10-CM | POA: Diagnosis not present

## 2015-05-21 DIAGNOSIS — D631 Anemia in chronic kidney disease: Secondary | ICD-10-CM | POA: Diagnosis not present

## 2015-05-21 DIAGNOSIS — Z992 Dependence on renal dialysis: Secondary | ICD-10-CM | POA: Diagnosis not present

## 2015-05-23 DIAGNOSIS — N2581 Secondary hyperparathyroidism of renal origin: Secondary | ICD-10-CM | POA: Diagnosis not present

## 2015-05-23 DIAGNOSIS — D509 Iron deficiency anemia, unspecified: Secondary | ICD-10-CM | POA: Diagnosis not present

## 2015-05-23 DIAGNOSIS — Z23 Encounter for immunization: Secondary | ICD-10-CM | POA: Diagnosis not present

## 2015-05-23 DIAGNOSIS — N186 End stage renal disease: Secondary | ICD-10-CM | POA: Diagnosis not present

## 2015-05-23 DIAGNOSIS — D631 Anemia in chronic kidney disease: Secondary | ICD-10-CM | POA: Diagnosis not present

## 2015-05-23 DIAGNOSIS — Z992 Dependence on renal dialysis: Secondary | ICD-10-CM | POA: Diagnosis not present

## 2015-05-26 DIAGNOSIS — D631 Anemia in chronic kidney disease: Secondary | ICD-10-CM | POA: Diagnosis not present

## 2015-05-26 DIAGNOSIS — Z23 Encounter for immunization: Secondary | ICD-10-CM | POA: Diagnosis not present

## 2015-05-26 DIAGNOSIS — Z992 Dependence on renal dialysis: Secondary | ICD-10-CM | POA: Diagnosis not present

## 2015-05-26 DIAGNOSIS — N186 End stage renal disease: Secondary | ICD-10-CM | POA: Diagnosis not present

## 2015-05-26 DIAGNOSIS — N2581 Secondary hyperparathyroidism of renal origin: Secondary | ICD-10-CM | POA: Diagnosis not present

## 2015-05-26 DIAGNOSIS — D509 Iron deficiency anemia, unspecified: Secondary | ICD-10-CM | POA: Diagnosis not present

## 2015-05-28 DIAGNOSIS — N2581 Secondary hyperparathyroidism of renal origin: Secondary | ICD-10-CM | POA: Diagnosis not present

## 2015-05-28 DIAGNOSIS — N186 End stage renal disease: Secondary | ICD-10-CM | POA: Diagnosis not present

## 2015-05-28 DIAGNOSIS — D509 Iron deficiency anemia, unspecified: Secondary | ICD-10-CM | POA: Diagnosis not present

## 2015-05-28 DIAGNOSIS — Z23 Encounter for immunization: Secondary | ICD-10-CM | POA: Diagnosis not present

## 2015-05-28 DIAGNOSIS — D631 Anemia in chronic kidney disease: Secondary | ICD-10-CM | POA: Diagnosis not present

## 2015-05-28 DIAGNOSIS — Z992 Dependence on renal dialysis: Secondary | ICD-10-CM | POA: Diagnosis not present

## 2015-05-30 DIAGNOSIS — D509 Iron deficiency anemia, unspecified: Secondary | ICD-10-CM | POA: Diagnosis not present

## 2015-05-30 DIAGNOSIS — N186 End stage renal disease: Secondary | ICD-10-CM | POA: Diagnosis not present

## 2015-05-30 DIAGNOSIS — D631 Anemia in chronic kidney disease: Secondary | ICD-10-CM | POA: Diagnosis not present

## 2015-05-30 DIAGNOSIS — Z23 Encounter for immunization: Secondary | ICD-10-CM | POA: Diagnosis not present

## 2015-05-30 DIAGNOSIS — N2581 Secondary hyperparathyroidism of renal origin: Secondary | ICD-10-CM | POA: Diagnosis not present

## 2015-05-30 DIAGNOSIS — Z992 Dependence on renal dialysis: Secondary | ICD-10-CM | POA: Diagnosis not present

## 2015-06-02 DIAGNOSIS — D631 Anemia in chronic kidney disease: Secondary | ICD-10-CM | POA: Diagnosis not present

## 2015-06-02 DIAGNOSIS — D509 Iron deficiency anemia, unspecified: Secondary | ICD-10-CM | POA: Diagnosis not present

## 2015-06-02 DIAGNOSIS — Z992 Dependence on renal dialysis: Secondary | ICD-10-CM | POA: Diagnosis not present

## 2015-06-02 DIAGNOSIS — N2581 Secondary hyperparathyroidism of renal origin: Secondary | ICD-10-CM | POA: Diagnosis not present

## 2015-06-02 DIAGNOSIS — Z23 Encounter for immunization: Secondary | ICD-10-CM | POA: Diagnosis not present

## 2015-06-02 DIAGNOSIS — N186 End stage renal disease: Secondary | ICD-10-CM | POA: Diagnosis not present

## 2015-06-04 DIAGNOSIS — D509 Iron deficiency anemia, unspecified: Secondary | ICD-10-CM | POA: Diagnosis not present

## 2015-06-04 DIAGNOSIS — Z23 Encounter for immunization: Secondary | ICD-10-CM | POA: Diagnosis not present

## 2015-06-04 DIAGNOSIS — N186 End stage renal disease: Secondary | ICD-10-CM | POA: Diagnosis not present

## 2015-06-04 DIAGNOSIS — D631 Anemia in chronic kidney disease: Secondary | ICD-10-CM | POA: Diagnosis not present

## 2015-06-04 DIAGNOSIS — N2581 Secondary hyperparathyroidism of renal origin: Secondary | ICD-10-CM | POA: Diagnosis not present

## 2015-06-04 DIAGNOSIS — Z992 Dependence on renal dialysis: Secondary | ICD-10-CM | POA: Diagnosis not present

## 2015-06-06 DIAGNOSIS — N186 End stage renal disease: Secondary | ICD-10-CM | POA: Diagnosis not present

## 2015-06-06 DIAGNOSIS — Z992 Dependence on renal dialysis: Secondary | ICD-10-CM | POA: Diagnosis not present

## 2015-06-06 DIAGNOSIS — D631 Anemia in chronic kidney disease: Secondary | ICD-10-CM | POA: Diagnosis not present

## 2015-06-06 DIAGNOSIS — D509 Iron deficiency anemia, unspecified: Secondary | ICD-10-CM | POA: Diagnosis not present

## 2015-06-06 DIAGNOSIS — N2581 Secondary hyperparathyroidism of renal origin: Secondary | ICD-10-CM | POA: Diagnosis not present

## 2015-06-06 DIAGNOSIS — Z23 Encounter for immunization: Secondary | ICD-10-CM | POA: Diagnosis not present

## 2015-06-09 DIAGNOSIS — Z23 Encounter for immunization: Secondary | ICD-10-CM | POA: Diagnosis not present

## 2015-06-09 DIAGNOSIS — Z992 Dependence on renal dialysis: Secondary | ICD-10-CM | POA: Diagnosis not present

## 2015-06-09 DIAGNOSIS — D631 Anemia in chronic kidney disease: Secondary | ICD-10-CM | POA: Diagnosis not present

## 2015-06-09 DIAGNOSIS — N186 End stage renal disease: Secondary | ICD-10-CM | POA: Diagnosis not present

## 2015-06-09 DIAGNOSIS — N2581 Secondary hyperparathyroidism of renal origin: Secondary | ICD-10-CM | POA: Diagnosis not present

## 2015-06-09 DIAGNOSIS — D509 Iron deficiency anemia, unspecified: Secondary | ICD-10-CM | POA: Diagnosis not present

## 2015-06-11 ENCOUNTER — Other Ambulatory Visit: Payer: Self-pay | Admitting: Vascular Surgery

## 2015-06-11 DIAGNOSIS — N186 End stage renal disease: Secondary | ICD-10-CM | POA: Diagnosis not present

## 2015-06-11 DIAGNOSIS — D509 Iron deficiency anemia, unspecified: Secondary | ICD-10-CM | POA: Diagnosis not present

## 2015-06-11 DIAGNOSIS — D631 Anemia in chronic kidney disease: Secondary | ICD-10-CM | POA: Diagnosis not present

## 2015-06-11 DIAGNOSIS — N2581 Secondary hyperparathyroidism of renal origin: Secondary | ICD-10-CM | POA: Diagnosis not present

## 2015-06-11 DIAGNOSIS — Z23 Encounter for immunization: Secondary | ICD-10-CM | POA: Diagnosis not present

## 2015-06-11 DIAGNOSIS — Z992 Dependence on renal dialysis: Secondary | ICD-10-CM | POA: Diagnosis not present

## 2015-06-12 ENCOUNTER — Encounter
Admission: RE | Disposition: A | Discharge status: Home / Self Care | Payer: Self-pay | Source: Ambulatory Visit | Attending: Vascular Surgery

## 2015-06-12 ENCOUNTER — Encounter: Payer: Self-pay | Admitting: *Deleted

## 2015-06-12 ENCOUNTER — Ambulatory Visit
Admission: RE | Admit: 2015-06-12 | Discharge: 2015-06-12 | Disposition: A | Discharge status: Home / Self Care | Payer: Medicare Other | Source: Ambulatory Visit | Attending: Vascular Surgery | Admitting: Vascular Surgery

## 2015-06-12 DIAGNOSIS — N028 Recurrent and persistent hematuria with other morphologic changes: Secondary | ICD-10-CM | POA: Diagnosis not present

## 2015-06-12 DIAGNOSIS — I120 Hypertensive chronic kidney disease with stage 5 chronic kidney disease or end stage renal disease: Secondary | ICD-10-CM | POA: Diagnosis not present

## 2015-06-12 DIAGNOSIS — I252 Old myocardial infarction: Secondary | ICD-10-CM | POA: Insufficient documentation

## 2015-06-12 DIAGNOSIS — M1990 Unspecified osteoarthritis, unspecified site: Secondary | ICD-10-CM | POA: Insufficient documentation

## 2015-06-12 DIAGNOSIS — I2510 Atherosclerotic heart disease of native coronary artery without angina pectoris: Secondary | ICD-10-CM | POA: Insufficient documentation

## 2015-06-12 DIAGNOSIS — J449 Chronic obstructive pulmonary disease, unspecified: Secondary | ICD-10-CM | POA: Insufficient documentation

## 2015-06-12 DIAGNOSIS — Y832 Surgical operation with anastomosis, bypass or graft as the cause of abnormal reaction of the patient, or of later complication, without mention of misadventure at the time of the procedure: Secondary | ICD-10-CM | POA: Diagnosis not present

## 2015-06-12 DIAGNOSIS — K219 Gastro-esophageal reflux disease without esophagitis: Secondary | ICD-10-CM | POA: Insufficient documentation

## 2015-06-12 DIAGNOSIS — F17210 Nicotine dependence, cigarettes, uncomplicated: Secondary | ICD-10-CM | POA: Insufficient documentation

## 2015-06-12 DIAGNOSIS — Z86010 Personal history of colonic polyps: Secondary | ICD-10-CM | POA: Insufficient documentation

## 2015-06-12 DIAGNOSIS — Z992 Dependence on renal dialysis: Secondary | ICD-10-CM | POA: Insufficient documentation

## 2015-06-12 DIAGNOSIS — T82858A Stenosis of vascular prosthetic devices, implants and grafts, initial encounter: Secondary | ICD-10-CM | POA: Diagnosis not present

## 2015-06-12 DIAGNOSIS — F1010 Alcohol abuse, uncomplicated: Secondary | ICD-10-CM | POA: Diagnosis not present

## 2015-06-12 DIAGNOSIS — N186 End stage renal disease: Secondary | ICD-10-CM | POA: Diagnosis not present

## 2015-06-12 DIAGNOSIS — I509 Heart failure, unspecified: Secondary | ICD-10-CM | POA: Diagnosis not present

## 2015-06-12 DIAGNOSIS — D649 Anemia, unspecified: Secondary | ICD-10-CM | POA: Diagnosis not present

## 2015-06-12 DIAGNOSIS — E785 Hyperlipidemia, unspecified: Secondary | ICD-10-CM | POA: Diagnosis not present

## 2015-06-12 HISTORY — PX: PERIPHERAL VASCULAR CATHETERIZATION: SHX172C

## 2015-06-12 LAB — POTASSIUM (ARMC VASCULAR LAB ONLY): Potassium (ARMC vascular lab): 4.4

## 2015-06-12 SURGERY — A/V SHUNTOGRAM/FISTULAGRAM
Anesthesia: Moderate Sedation

## 2015-06-12 MED ORDER — MORPHINE SULFATE (PF) 4 MG/ML IV SOLN: 2.0000 mg | INTRAVENOUS | Status: DC | PRN

## 2015-06-12 MED ORDER — MIDAZOLAM HCL 2 MG/2ML IJ SOLN
INTRAMUSCULAR | Status: DC | PRN
  Administered 2015-06-12 (×2): 2 mg via INTRAVENOUS

## 2015-06-12 MED ORDER — GUAIFENESIN-DM 100-10 MG/5ML PO SYRP: 15.0000 mL | ORAL_SOLUTION | ORAL | Status: DC | PRN

## 2015-06-12 MED ORDER — CHLORHEXIDINE GLUCONATE CLOTH 2 % EX PADS: 6.0000 | MEDICATED_PAD | Freq: Once | CUTANEOUS | Status: DC

## 2015-06-12 MED ORDER — HEPARIN SODIUM (PORCINE) 1000 UNIT/ML IJ SOLN
INTRAMUSCULAR | Status: DC | PRN
Start: 2015-06-12 — End: 2015-06-12
  Administered 2015-06-12: 3000 [IU] via INTRAVENOUS

## 2015-06-12 MED ORDER — ONDANSETRON HCL 4 MG/2ML IJ SOLN: 4.0000 mg | Freq: Four times a day (QID) | INTRAMUSCULAR | Status: DC | PRN

## 2015-06-12 MED ORDER — METOPROLOL TARTRATE 1 MG/ML IV SOLN: 2.0000 mg | INTRAVENOUS | Status: DC | PRN

## 2015-06-12 MED ORDER — LIDOCAINE HCL (PF) 1 % IJ SOLN
INTRAMUSCULAR | Status: DC | PRN
Start: 2015-06-12 — End: 2015-06-12
  Administered 2015-06-12: 10 mL

## 2015-06-12 MED ORDER — HEPARIN SODIUM (PORCINE) 1000 UNIT/ML IJ SOLN
INTRAMUSCULAR | Status: AC
  Filled 2015-06-12: qty 1

## 2015-06-12 MED ORDER — DEXTROSE 5 % IV SOLN
INTRAVENOUS | Status: AC
  Administered 2015-06-12: 10:00:00
  Filled 2015-06-12: qty 1.5

## 2015-06-12 MED ORDER — ACETAMINOPHEN 325 MG PO TABS: 325.0000 mg | ORAL_TABLET | ORAL | Status: DC | PRN

## 2015-06-12 MED ORDER — PHENOL 1.4 % MT LIQD: 1.0000 | OROMUCOSAL | Status: DC | PRN

## 2015-06-12 MED ORDER — ALUM & MAG HYDROXIDE-SIMETH 200-200-20 MG/5ML PO SUSP: 15.0000 mL | ORAL | Status: DC | PRN

## 2015-06-12 MED ORDER — LIDOCAINE-EPINEPHRINE (PF) 1 %-1:200000 IJ SOLN
INTRAMUSCULAR | Status: AC
  Filled 2015-06-12: qty 30

## 2015-06-12 MED ORDER — FENTANYL CITRATE (PF) 100 MCG/2ML IJ SOLN
INTRAMUSCULAR | Status: AC
  Filled 2015-06-12: qty 2

## 2015-06-12 MED ORDER — LABETALOL HCL 5 MG/ML IV SOLN: 10.0000 mg | INTRAVENOUS | Status: DC | PRN

## 2015-06-12 MED ORDER — OXYCODONE-ACETAMINOPHEN 5-325 MG PO TABS: 1.0000 | ORAL_TABLET | ORAL | Status: DC | PRN

## 2015-06-12 MED ORDER — MIDAZOLAM HCL 2 MG/2ML IJ SOLN
INTRAMUSCULAR | Status: AC
  Filled 2015-06-12: qty 4

## 2015-06-12 MED ORDER — IOHEXOL 300 MG/ML  SOLN
INTRAMUSCULAR | Status: DC | PRN
  Administered 2015-06-12: 25 mL via INTRAVENOUS

## 2015-06-12 MED ORDER — SODIUM CHLORIDE 0.9 % IV SOLN: 500.0000 mL | Freq: Once | INTRAVENOUS | Status: DC | PRN

## 2015-06-12 MED ORDER — FENTANYL CITRATE (PF) 100 MCG/2ML IJ SOLN
INTRAMUSCULAR | Status: DC | PRN
  Administered 2015-06-12 (×2): 50 ug via INTRAVENOUS

## 2015-06-12 MED ORDER — HYDRALAZINE HCL 20 MG/ML IJ SOLN: 5.0000 mg | INTRAMUSCULAR | Status: DC | PRN

## 2015-06-12 MED ORDER — SODIUM CHLORIDE 0.9 % IV SOLN
INTRAVENOUS | Status: DC
  Administered 2015-06-12: 10:00:00 via INTRAVENOUS

## 2015-06-12 MED ORDER — HEPARIN (PORCINE) IN NACL 2-0.9 UNIT/ML-% IJ SOLN
INTRAMUSCULAR | Status: AC
  Filled 2015-06-12: qty 1000

## 2015-06-12 MED ORDER — DEXTROSE 5 % IV SOLN: 1.5000 g | INTRAVENOUS | Status: DC

## 2015-06-12 MED ORDER — ACETAMINOPHEN 325 MG RE SUPP
325.0000 mg | RECTAL | Status: DC | PRN
Start: 2015-06-12 — End: 2015-06-12

## 2015-06-12 SURGICAL SUPPLY — 12 items
BAG DECANTER STRL (MISCELLANEOUS) ×4 IMPLANT
BALLN LUTONIX DCB 6X60X130 (BALLOONS) ×4
BALLOON LUTONIX DCB 6X60X130 (BALLOONS) ×2 IMPLANT
CANNULA 5F STIFF (CANNULA) ×4 IMPLANT
CATH TORCON 5FR 0.38 (CATHETERS) ×4 IMPLANT
DEVICE PRESTO INFLATION (MISCELLANEOUS) ×4 IMPLANT
DRAPE BRACHIAL (DRAPES) ×4 IMPLANT
PACK ANGIOGRAPHY (CUSTOM PROCEDURE TRAY) ×4 IMPLANT
SHEATH BRITE TIP 6FRX5.5 (SHEATH) ×4 IMPLANT
SUT MNCRL AB 4-0 PS2 18 (SUTURE) ×4 IMPLANT
TOWEL OR 17X26 4PK STRL BLUE (TOWEL DISPOSABLE) ×4 IMPLANT
WIRE MAGIC TORQUE 260C (WIRE) ×4 IMPLANT

## 2015-06-12 NOTE — Op Note (Signed)
[facility]    OPERATIVE NOTE   PROCEDURE: 1.   Left radiobasilic arteriovenous fistula cannulation under ultrasound guidance 2.   Left arm fistulagram including central venogram 3.   Percutaneous transluminal angioplasty of perianastomotic stenosis with 6 mm diameter by 6 cm length Lutonix drug-coated angioplasty balloon  PRE-OPERATIVE DIAGNOSIS: 1. ESRD 2. Poorly functional left radial basilic AVF  POST-OPERATIVE DIAGNOSIS: same as above   SURGEON: Leotis Pain, MD  ANESTHESIA: local with MCS  ESTIMATED BLOOD LOSS: Minimal  FINDING(S): 1. Moderate perianastomotic stenosis improved with angioplasty  SPECIMEN(S):  None  CONTRAST: 30 cc  INDICATIONS: Daryl Weeks is a 54 y.o. male who presents with malfunctioning  left radial basilic arteriovenous fistula.  The patient is scheduled for  left arm fistulagram.  The patient is aware the risks include but are not limited to: bleeding, infection, thrombosis of the cannulated access, and possible anaphylactic reaction to the contrast.  The patient is aware of the risks of the procedure and elects to proceed forward.  DESCRIPTION: After full informed written consent was obtained, the patient was brought back to the angiography suite and placed supine upon the angiography table.  The patient was connected to monitoring equipment.  The  left arm was prepped and draped in the standard fashion for a percutaneous access intervention.  Under ultrasound guidance, the  left radial basilic arteriovenous fistula was cannulated with a micropuncture needle under direct ultrasound guidance in a retrograde fashion just below the antecubital fossa and a permanent image was performed.  The microwire was advanced into the fistula and the needle was exchanged for the a microsheath.  I then upsized to a 6 Fr Sheath, and used a Kumpe catheter and a Magic torque wire to navigate to the radial artery at the anastomosis and imaging was  performed.  Hand injections were completed to image the access including the central venous system. This demonstrated moderate stenosis in the perianastomotic portion of the cephalic vein in the 33-54% range. The remainder of the fistula appeared patent with some tortuosity present in the mid forearm but this did not appear highly stenotic, the upper arm outflow was through the basilic vein, and the central venous circulation appeared patent. Based on the images, this patient will need intervention to this area. I then gave the patient 3000 units of intravenous heparin.  I then crossed the stenosis with a Magic Tourqe wire.  Based on the imaging, a 6 mm x 6 cm  Lutonix drug-coated angioplasty balloon was selected.  The balloon was centered around the perianastomotic stenosis with the distal tip of the balloon just to the radial artery at the anastomosis and inflated to 12 ATM for 1 minute(s).  On completion imaging, a 20-25 % residual stenosis was present.     Based on the completion imaging, no further intervention is necessary.  The wire and balloon were removed from the sheath.  A 4-0 Monocryl purse-string suture was sewn around the sheath.  The sheath was removed while tying down the suture.  A sterile bandage was applied to the puncture site.  COMPLICATIONS: None  CONDITION: Stable   DEW,JASON  06/12/2015 10:26 AM

## 2015-06-12 NOTE — H&P (Signed)
[facility] Admission History & Physical  MRN : [id number]  Daryl Weeks is a 54 y.o. (24-Feb-1961) male who presents with chief complaint of No chief complaint on file. [name]  History of Present Illness: Patient sent from the dialysis access center for diminished flow in his left arm AVF. Has history of steal and has done better with treatment for that.  Otherwise feels well without complaints today.  Current Facility-Administered Medications  Medication Dose Route Frequency Provider Last Rate Last Dose  . 0.9 %  sodium chloride infusion   Intravenous Continuous Kimberly A Stegmayer, PA-C      . cefUROXime (ZINACEF) 1.5 g in dextrose 5 % 50 mL IVPB  1.5 g Intravenous 30 min Pre-Op Janalyn Harder Stegmayer, PA-C      . Chlorhexidine Gluconate Cloth 2 % PADS 6 each  6 each Topical Once Sela Hua, PA-C        Past Medical History  Diagnosis Date  . CHF (congestive heart failure)   . COPD (chronic obstructive pulmonary disease)   . ETOH abuse   . Tobacco abuse   . Non-ST elevation MI (NSTEMI)   . Colon polyps   . Anemia   . Tobacco abuse   . Hyperlipidemia   . Hypertension   . Lower GI bleed     Due to colon polyps. Status post resection of 14 polyps  . Shortness of breath   . Pneumonia     hx  . End stage renal disease     m-w-f   -davida   . IgA nephropathy   . GERD (gastroesophageal reflux disease)     hx  . Arthritis   . Coronary artery disease     Non-ST elevation myocardial infarction in February of 2015. In the setting of hypertensive urgency and blood loss anemia. Cardiac catheterization showed significant three-vessel coronary artery disease. EF was 55% by echo. He underwent CABG at [facility] on May 19 with LIMA to LAD, Sequential SVG to OM1 and OM2, and SVG to RCA    Past Surgical History  Procedure Laterality Date  . Cardiac catheterization      RCA 90% and calcified mid LAD 80% Stenosis  . Renal biopsy Left 14  . Dialysis  catheterr  2/15  . Coronary artery bypass graft N/A 11/29/2013    Procedure: CORONARY ARTERY BYPASS GRAFTING (CABG) x 4 using endoscopically harvested right saphenous vein and left internal mammary artery;  Surgeon: [name], MD;  Location: [place] OR;  Service: Open Heart Surgery;  Laterality: N/A;  . Intraoperative transesophageal echocardiogram N/A 11/29/2013    Procedure: INTRAOPERATIVE TRANSESOPHAGEAL ECHOCARDIOGRAM;  Surgeon: [name], MD;  Location: [facility];  Service: Open Heart Surgery;  Laterality: N/A;  . Av fistula placement      Social History Social History  Substance Use Topics  . Smoking status: Current Every Day Smoker -- 0.50 packs/day for 30 years    Types: Cigarettes  . Smokeless tobacco: Never Used  . Alcohol Use: 1.2 oz/week    2 Cans of beer per week     Comment:  less now    Family History Family History  Problem Relation Age of Onset  . Heart disease Father   . Heart disease Brother   No bleeding or clotting disorders  No Known Allergies   REVIEW OF SYSTEMS (Negative unless checked)  Constitutional: [] Weight loss  [] Fever  [] Chills Cardiac: [] Chest pain   [] Chest pressure   [] Palpitations   []   Shortness of breath when laying flat   [] Shortness of breath at rest   [] Shortness of breath with exertion. Vascular:  [] Pain in legs with walking   [] Pain in legs at rest   [] Pain in legs when laying flat   [] Claudication   [] Pain in feet when walking  [] Pain in feet at rest  [] Pain in feet when laying flat   [] History of DVT   [] Phlebitis   [] Swelling in legs   [] Varicose veins   [] Non-healing ulcers Pulmonary:   [] Uses home oxygen   [] Productive cough   [] Hemoptysis   [] Wheeze  [] COPD   [] Asthma Neurologic:  [] Dizziness  [] Blackouts   [] Seizures   [] History of stroke   [] History of TIA  [] Aphasia   [] Temporary blindness   [] Dysphagia   [] Weakness or numbness in arms   [] Weakness or numbness in legs Musculoskeletal:  [] Arthritis   [] Joint swelling   [] Joint pain    [] Low back pain Hematologic:  [] Easy bruising  [] Easy bleeding   [] Hypercoagulable state   [] Anemic  [] Hepatitis Gastrointestinal:  [] Blood in stool   [] Vomiting blood  [] Gastroesophageal reflux/heartburn   [] Difficulty swallowing. Genitourinary:  [x] Chronic kidney disease   [] Difficult urination  [] Frequent urination  [] Burning with urination   [] Blood in urine Skin:  [] Rashes   [] Ulcers   [] Wounds Psychological:  [] History of anxiety   []  History of major depression.  Physical Examination  Filed Vitals:   06/12/15 0929  BP: 140/87  Pulse: 57  Temp: 98.6 F (37 C)  TempSrc: Oral  Resp: 14  Height: 5\' 6"  ([id number] m)  Weight: 72.576 kg (160 lb)  SpO2: 98%   Body mass index is 25.84 kg/(m^2). Gen: WD/WN, NAD Head: [place]/AT, No temporalis wasting. Prominent temp pulse not noted. Ear/Nose/Throat: Hearing grossly intact, nares w/o erythema or drainage, oropharynx w/o Erythema/Exudate,  Eyes: PERRLA, EOMI.  Neck: Supple, no nuchal rigidity.  No bruit or JVD.  Pulmonary:  Good air movement, clear to auscultation bilaterally, no use of accessory muscles.  Cardiac: RRR, normal S1, S2, no Murmurs, rubs or gallops. Vascular: left arm AVF with good thrill and bruit Vessel Right Left  Radial Palpable Palpable  Ulnar Palpable Palpable  Brachial Palpable Palpable  Carotid Palpable, without bruit Palpable, without bruit  Aorta Not palpable N/A  Femoral Palpable Palpable  Popliteal Not Palpable Not Palpable  PT Not Palpable Palpable  DP Not Palpable Not Palpable   Gastrointestinal: soft, non-tender/non-distended. No guarding/reflex.  Musculoskeletal: M/S 5/5 throughout.  Extremities without ischemic changes.  No deformity or atrophy.  Neurologic: CN 2-12 intact. Pain and light touch intact in extremities.  Symmetrical.  Speech is fluent. Motor exam as listed above. Psychiatric: Judgment intact, Mood & affect appropriate for pt's clinical situation. Dermatologic: No rashes or ulcers noted.  No  cellulitis or open wounds. Lymph : No Cervical, Axillary, or Inguinal lymphadenopathy.      CBC Lab Results  Component Value Date   WBC 8.4 07/11/2014   HGB 13.7 07/11/2014   HCT 42.2 07/11/2014   MCV 109* 07/11/2014   PLT 223 07/11/2014    BMET    Component Value Date/Time   NA 132* 06/24/2014 0903   NA 129* 12/07/2013 0944   K 4.8 07/11/2014 1046   K 3.9 12/07/2013 0944   CL 96* 06/24/2014 0903   CL 89* 12/07/2013 0944   CO2 21 06/24/2014 0903   CO2 25 12/07/2013 0944   GLUCOSE 92 06/24/2014 0903   GLUCOSE 104* 12/07/2013 0944  BUN 60* 06/24/2014 0903   BUN 30* 12/07/2013 0944   CREATININE 9.10* 06/24/2014 0903   CREATININE 4.97* 12/07/2013 0944   CALCIUM 8.8 06/24/2014 0903   CALCIUM 8.5 12/07/2013 0944   GFRNONAA 7* 06/24/2014 0903   GFRNONAA 11* 01/14/2014 1051   GFRNONAA 12* 12/07/2013 0944   GFRAA 8* 06/24/2014 0903   GFRAA 12* 01/14/2014 1051   GFRAA 14* 12/07/2013 0944   CrCl cannot be calculated (Patient has no serum creatinine result on file.).  COAG Lab Results  Component Value Date   INR 1.31 11/29/2013   INR 0.99 11/27/2013   INR 1.0 10/30/2013    Radiology No results found.   Assessment/Plan 1. ESRD. On HD for several years 2. Dysfunction of dialysis access. Sent from dialysis center.  For fistulagram today 3. HTN. Stable. Continue outpatient meds 4. Hyperlipidemia. Stable. Continue outpatient meds.   DEW,JASON, MD  06/12/2015 9:37 AM

## 2015-06-12 NOTE — OR Nursing (Signed)
etco2 38

## 2015-06-12 NOTE — OR Nursing (Signed)
Pre procedure ETCO2 36

## 2015-06-12 NOTE — Discharge Instructions (Signed)
Fistulogram, Care After °Refer to this sheet in the next few weeks. These instructions provide you with information on caring for yourself after your procedure. Your health care provider may also give you more specific instructions. Your treatment has been planned according to current medical practices, but problems sometimes occur. Call your health care provider if you have any problems or questions after your procedure. °WHAT TO EXPECT AFTER THE PROCEDURE °After your procedure, it is typical to have the following: °· A small amount of discomfort in the area where the catheters were placed. °· A small amount of bruising around the fistula. °· Sleepiness and fatigue. °HOME CARE INSTRUCTIONS °· Rest at home for the day following your procedure. °· Do not drive or operate heavy machinery while taking pain medicine. °· Take medicines only as directed by your health care provider. °· Do not take baths, swim, or use a hot tub until your health care provider approves. You may shower 24 hours after the procedure or as directed by your health care provider. °· There are many different ways to close and cover an incision, including stitches, skin glue, and adhesive strips. Follow your health care provider's instructions on: °¨ Incision care. °¨ Bandage (dressing) changes and removal. °¨ Incision closure removal. °· Monitor your dialysis fistula carefully. °SEEK MEDICAL CARE IF: °· You have drainage, redness, swelling, or pain at your catheter site. °· You have a fever. °· You have chills. °SEEK IMMEDIATE MEDICAL CARE IF: °· You feel weak. °· You have trouble balancing. °· You have trouble moving your arms or legs. °· You have problems with your speech or vision. °· You can no longer feel a vibration or buzz when you put your fingers over your dialysis fistula. °· The limb that was used for the procedure: °¨ Swells. °¨ Is painful. °¨ Is cold. °¨ Is discolored, such as blue or pale white. °Document Released: 01/14/2014  Document Reviewed: 10/19/2013 °ExitCare® Patient Information ©2015 [employer]. This information is not intended to replace advice given to you by your health care provider. Make sure you discuss any questions you have with your health care provider. ° °

## 2015-06-13 ENCOUNTER — Encounter: Payer: Self-pay | Admitting: Vascular Surgery

## 2015-06-13 DIAGNOSIS — D509 Iron deficiency anemia, unspecified: Secondary | ICD-10-CM | POA: Diagnosis not present

## 2015-06-13 DIAGNOSIS — N2581 Secondary hyperparathyroidism of renal origin: Secondary | ICD-10-CM | POA: Diagnosis not present

## 2015-06-13 DIAGNOSIS — Z992 Dependence on renal dialysis: Secondary | ICD-10-CM | POA: Diagnosis not present

## 2015-06-13 DIAGNOSIS — Z23 Encounter for immunization: Secondary | ICD-10-CM | POA: Diagnosis not present

## 2015-06-13 DIAGNOSIS — D631 Anemia in chronic kidney disease: Secondary | ICD-10-CM | POA: Diagnosis not present

## 2015-06-13 DIAGNOSIS — N186 End stage renal disease: Secondary | ICD-10-CM | POA: Diagnosis not present

## 2015-06-14 DIAGNOSIS — N186 End stage renal disease: Secondary | ICD-10-CM | POA: Diagnosis not present

## 2015-06-14 DIAGNOSIS — Z992 Dependence on renal dialysis: Secondary | ICD-10-CM | POA: Diagnosis not present

## 2015-06-16 DIAGNOSIS — N2581 Secondary hyperparathyroidism of renal origin: Secondary | ICD-10-CM | POA: Diagnosis not present

## 2015-06-16 DIAGNOSIS — D631 Anemia in chronic kidney disease: Secondary | ICD-10-CM | POA: Diagnosis not present

## 2015-06-16 DIAGNOSIS — Z992 Dependence on renal dialysis: Secondary | ICD-10-CM | POA: Diagnosis not present

## 2015-06-16 DIAGNOSIS — N186 End stage renal disease: Secondary | ICD-10-CM | POA: Diagnosis not present

## 2015-06-16 DIAGNOSIS — D509 Iron deficiency anemia, unspecified: Secondary | ICD-10-CM | POA: Diagnosis not present

## 2015-06-24 DIAGNOSIS — J449 Chronic obstructive pulmonary disease, unspecified: Secondary | ICD-10-CM | POA: Diagnosis not present

## 2015-06-24 DIAGNOSIS — Z01818 Encounter for other preprocedural examination: Secondary | ICD-10-CM | POA: Diagnosis not present

## 2015-06-24 DIAGNOSIS — J439 Emphysema, unspecified: Secondary | ICD-10-CM | POA: Diagnosis not present

## 2015-06-24 DIAGNOSIS — F17200 Nicotine dependence, unspecified, uncomplicated: Secondary | ICD-10-CM | POA: Diagnosis not present

## 2015-06-24 DIAGNOSIS — R0600 Dyspnea, unspecified: Secondary | ICD-10-CM | POA: Diagnosis not present

## 2015-07-15 DIAGNOSIS — Z992 Dependence on renal dialysis: Secondary | ICD-10-CM | POA: Diagnosis not present

## 2015-07-15 DIAGNOSIS — N186 End stage renal disease: Secondary | ICD-10-CM | POA: Diagnosis not present

## 2015-07-16 DIAGNOSIS — N2581 Secondary hyperparathyroidism of renal origin: Secondary | ICD-10-CM | POA: Diagnosis not present

## 2015-07-16 DIAGNOSIS — Z992 Dependence on renal dialysis: Secondary | ICD-10-CM | POA: Diagnosis not present

## 2015-07-16 DIAGNOSIS — D509 Iron deficiency anemia, unspecified: Secondary | ICD-10-CM | POA: Diagnosis not present

## 2015-07-16 DIAGNOSIS — N186 End stage renal disease: Secondary | ICD-10-CM | POA: Diagnosis not present

## 2015-07-24 DIAGNOSIS — T82318A Breakdown (mechanical) of other vascular grafts, initial encounter: Secondary | ICD-10-CM | POA: Diagnosis not present

## 2015-07-24 DIAGNOSIS — T82858A Stenosis of vascular prosthetic devices, implants and grafts, initial encounter: Secondary | ICD-10-CM | POA: Diagnosis not present

## 2015-07-24 DIAGNOSIS — N186 End stage renal disease: Secondary | ICD-10-CM | POA: Diagnosis not present

## 2015-07-24 DIAGNOSIS — Y841 Kidney dialysis as the cause of abnormal reaction of the patient, or of later complication, without mention of misadventure at the time of the procedure: Secondary | ICD-10-CM | POA: Diagnosis not present

## 2015-07-24 DIAGNOSIS — Z992 Dependence on renal dialysis: Secondary | ICD-10-CM | POA: Diagnosis not present

## 2015-08-14 DIAGNOSIS — N186 End stage renal disease: Secondary | ICD-10-CM | POA: Diagnosis not present

## 2015-08-14 DIAGNOSIS — Z992 Dependence on renal dialysis: Secondary | ICD-10-CM | POA: Diagnosis not present

## 2015-08-15 DIAGNOSIS — Z992 Dependence on renal dialysis: Secondary | ICD-10-CM | POA: Diagnosis not present

## 2015-08-15 DIAGNOSIS — D631 Anemia in chronic kidney disease: Secondary | ICD-10-CM | POA: Diagnosis not present

## 2015-08-15 DIAGNOSIS — N2581 Secondary hyperparathyroidism of renal origin: Secondary | ICD-10-CM | POA: Diagnosis not present

## 2015-08-15 DIAGNOSIS — D509 Iron deficiency anemia, unspecified: Secondary | ICD-10-CM | POA: Diagnosis not present

## 2015-08-15 DIAGNOSIS — N186 End stage renal disease: Secondary | ICD-10-CM | POA: Diagnosis not present

## 2015-08-19 DIAGNOSIS — Z6826 Body mass index (BMI) 26.0-26.9, adult: Secondary | ICD-10-CM | POA: Diagnosis not present

## 2015-08-19 DIAGNOSIS — F17200 Nicotine dependence, unspecified, uncomplicated: Secondary | ICD-10-CM | POA: Diagnosis not present

## 2015-08-19 DIAGNOSIS — Z7682 Awaiting organ transplant status: Secondary | ICD-10-CM | POA: Diagnosis not present

## 2015-08-19 DIAGNOSIS — J439 Emphysema, unspecified: Secondary | ICD-10-CM | POA: Diagnosis not present

## 2015-08-19 DIAGNOSIS — J432 Centrilobular emphysema: Secondary | ICD-10-CM | POA: Diagnosis not present

## 2015-09-01 DIAGNOSIS — Z992 Dependence on renal dialysis: Secondary | ICD-10-CM | POA: Diagnosis not present

## 2015-09-04 ENCOUNTER — Encounter: Payer: Self-pay | Admitting: Cardiovascular Disease

## 2015-09-04 ENCOUNTER — Ambulatory Visit (INDEPENDENT_AMBULATORY_CARE_PROVIDER_SITE_OTHER): Payer: Medicare Other | Admitting: Cardiovascular Disease

## 2015-09-04 VITALS — BP 170/86 | HR 66 | Ht 67.0 in | Wt 163.0 lb

## 2015-09-04 DIAGNOSIS — I5032 Chronic diastolic (congestive) heart failure: Secondary | ICD-10-CM

## 2015-09-04 DIAGNOSIS — I2510 Atherosclerotic heart disease of native coronary artery without angina pectoris: Secondary | ICD-10-CM

## 2015-09-04 DIAGNOSIS — E785 Hyperlipidemia, unspecified: Secondary | ICD-10-CM | POA: Diagnosis not present

## 2015-09-04 DIAGNOSIS — I10 Essential (primary) hypertension: Secondary | ICD-10-CM | POA: Diagnosis not present

## 2015-09-04 NOTE — Patient Instructions (Signed)
Medication Instructions: Continue same medications.   Labwork: None.   Procedures/Testing: None.   Follow-Up: 6 months with Dr. Jeramey Lanuza.   Any Additional Special Instructions Will Be Listed Below (If Applicable).   

## 2015-09-04 NOTE — Assessment & Plan Note (Signed)
He appears to be euvolemic. 

## 2015-09-04 NOTE — Assessment & Plan Note (Signed)
Blood pressure is increasing again but he reports hypotension during dialysis. If this continues to be the trend, consider resuming hydralazine. He is already on metoprolol and amlodipine.

## 2015-09-04 NOTE — Progress Notes (Signed)
HPI  This is a 54 year old Caucasian male who is here today for followup visit regarding coronary artery disease.  He is status post CABG in March 2015 after he presented with non-ST elevation myocardial infarction in the setting of severe anemia due to GI bleed.  He has known history of end-stage renal disease currently on dialysis and he is on the transplant list at [facility]. Other medical conditions include chronic diastolic heart failure, hypertension, hyperlipidemia and previous tobacco and alcohol use. He has been doing very well and denies any chest pain or shortness of breath. His blood pressure is noted to be elevated. Previously, he was weaned off hydralazine due to hypotension.  He continues to be on metoprolol and amlodipine. He underwent cardiac evaluation in March 2016 in anticipation of kidney transplant. Nuclear stress test was normal. Echocardiogram showed normal LV systolic function with mildly dilated left atrium.    No Known Allergies   Current Outpatient Prescriptions on File Prior to Visit  Medication Sig Dispense Refill  . amLODipine (NORVASC) 5 MG tablet take 1 tablet by mouth once daily 30 tablet 6  . aspirin EC 81 MG tablet Take 1 tablet (81 mg total) by mouth daily. 90 tablet 3  . atorvastatin (LIPITOR) 20 MG tablet take 1 tablet by mouth at bedtime 30 tablet 6  . cinacalcet (SENSIPAR) 30 MG tablet Take 30 mg by mouth daily.    . furosemide (LASIX) 80 MG tablet Take 80 mg by mouth daily.    [name] HYDROcodone-acetaminophen (NORCO/VICODIN) 5-325 MG per tablet Take 1 tablet by mouth every 6 (six) hours as needed for moderate pain.    . metoprolol (LOPRESSOR) 50 MG tablet take 1 tablet by mouth twice a day 60 tablet 6  . traMADol (ULTRAM) 50 MG tablet Take 1 tablet (50 mg total) by mouth every 6 (six) hours as needed. 40 tablet 0  . zolpidem (AMBIEN) 5 MG tablet Take 1 tablet (5 mg total) by mouth at bedtime as needed for sleep. 30 tablet 0   No current  facility-administered medications on file prior to visit.     Past Medical History  Diagnosis Date  . CHF (congestive heart failure) ([place])   . COPD (chronic obstructive pulmonary disease) ([place])   . ETOH abuse   . Tobacco abuse   . Non-ST elevation MI (NSTEMI) ([place])   . Colon polyps   . Anemia   . Tobacco abuse   . Hyperlipidemia   . Hypertension   . Lower GI bleed     Due to colon polyps. Status post resection of 14 polyps  . Shortness of breath   . Pneumonia     hx  . End stage renal disease ([place])     m-w-f   -davida [place]  . IgA nephropathy   . GERD (gastroesophageal reflux disease)     hx  . Arthritis   . Coronary artery disease     Non-ST elevation myocardial infarction in February of 2015. In the setting of hypertensive urgency and blood loss anemia. Cardiac catheterization showed significant three-vessel coronary artery disease. EF was 55% by echo. He underwent CABG at [facility] on May 19 with LIMA to LAD, Sequential SVG to OM1 and OM2, and SVG to RCA     Past Surgical History  Procedure Laterality Date  . Cardiac catheterization      RCA 90% and calcified mid LAD 80% Stenosis  . Renal biopsy Left 14  . Dialysis catheterr  2/15  .  Coronary artery bypass graft N/A 11/29/2013    Procedure: CORONARY ARTERY BYPASS GRAFTING (CABG) x 4 using endoscopically harvested right saphenous vein and left internal mammary artery;  Surgeon: [name], MD;  Location: [place] OR;  Service: Open Heart Surgery;  Laterality: N/A;  . Intraoperative transesophageal echocardiogram N/A 11/29/2013    Procedure: INTRAOPERATIVE TRANSESOPHAGEAL ECHOCARDIOGRAM;  Surgeon: [name], MD;  Location: [facility] OR;  Service: Open Heart Surgery;  Laterality: N/A;  . Av fistula placement    . Peripheral vascular catheterization N/A 06/12/2015    Procedure: A/V Shuntogram/Fistulagram;  Surgeon: [name], MD;  Location: [place] [facility];  Service: Cardiovascular;  Laterality: N/A;  . Peripheral vascular  catheterization Left 06/12/2015    Procedure: A/V Shunt Intervention;  Surgeon: [name], MD;  Location: [place] [facility];  Service: Cardiovascular;  Laterality: Left;     Family History  Problem Relation Age of Onset  . Heart disease Father   . Heart disease Brother      Social History   Social History  . Marital Status: Single    Spouse Name: N/A  . Number of Children: N/A  . Years of Education: N/A   Occupational History  . Not on file.   Social History Main Topics  . Smoking status: Current Every Day Smoker -- 0.50 packs/day for 30 years    Types: Cigarettes  . Smokeless tobacco: Never Used  . Alcohol Use: 1.2 oz/week    2 Cans of beer per week     Comment:  less now  . Drug Use: No  . Sexual Activity: Not on file   Other Topics Concern  . Not on file   Social History Narrative    PHYSICAL EXAM   BP 170/86 mmHg  Pulse 66  Ht 5\' 7"  (1.702 m)  Wt 163 lb (73.936 kg)  BMI 25.52 kg/m2 Constitutional: He is oriented to person, place, and time. He appears well-developed and well-nourished. No distress.  HENT: No nasal discharge.  Head: Normocephalic and atraumatic.  Eyes: Pupils are equal and round. Right eye exhibits no discharge. Left eye exhibits no discharge.  Neck: Normal range of motion. Neck supple. No JVD present. No thyromegaly present.  Cardiovascular: Normal rate, regular rhythm, normal heart sounds and. Exam reveals no gallop and no friction rub. No murmur heard.  Pulmonary/Chest: Effort normal and breath sounds normal. No stridor. No respiratory distress. He has no wheezes. He has no rales. He exhibits no tenderness.  Abdominal: Soft. Bowel sounds are normal. He exhibits no distension. There is no tenderness. There is no rebound and no guarding.  Musculoskeletal: Normal range of motion. He exhibits trace edema .  Neurological: He is alert and oriented to person, place, and time. Coordination normal.  Skin: Skin is warm and dry. Possible spider  angiomas noted on the chest. He is not diaphoretic. No erythema. No pallor.  Psychiatric: He has a normal mood and affect. His behavior is normal. Judgment and thought content normal.     EKG: Normal sinus rhythm with a PVC. No significant ST or T wave changes

## 2015-09-04 NOTE — Assessment & Plan Note (Signed)
Continue treatment with atorvastatin with a target LDL of less than 70. 

## 2015-09-04 NOTE — Assessment & Plan Note (Signed)
He is doing very well overall with no symptoms suggestive of angina. Continue medical therapy. Cardiac workup in March of this year was unremarkable.

## 2015-09-14 DIAGNOSIS — N186 End stage renal disease: Secondary | ICD-10-CM | POA: Diagnosis not present

## 2015-09-14 DIAGNOSIS — Z992 Dependence on renal dialysis: Secondary | ICD-10-CM | POA: Diagnosis not present

## 2015-09-15 DIAGNOSIS — N2581 Secondary hyperparathyroidism of renal origin: Secondary | ICD-10-CM | POA: Diagnosis not present

## 2015-09-15 DIAGNOSIS — D631 Anemia in chronic kidney disease: Secondary | ICD-10-CM | POA: Diagnosis not present

## 2015-09-15 DIAGNOSIS — N186 End stage renal disease: Secondary | ICD-10-CM | POA: Diagnosis not present

## 2015-09-15 DIAGNOSIS — D509 Iron deficiency anemia, unspecified: Secondary | ICD-10-CM | POA: Diagnosis not present

## 2015-09-15 DIAGNOSIS — Z992 Dependence on renal dialysis: Secondary | ICD-10-CM | POA: Diagnosis not present

## 2015-09-17 DIAGNOSIS — D631 Anemia in chronic kidney disease: Secondary | ICD-10-CM | POA: Diagnosis not present

## 2015-09-17 DIAGNOSIS — N186 End stage renal disease: Secondary | ICD-10-CM | POA: Diagnosis not present

## 2015-09-17 DIAGNOSIS — D509 Iron deficiency anemia, unspecified: Secondary | ICD-10-CM | POA: Diagnosis not present

## 2015-09-17 DIAGNOSIS — Z992 Dependence on renal dialysis: Secondary | ICD-10-CM | POA: Diagnosis not present

## 2015-09-17 DIAGNOSIS — N2581 Secondary hyperparathyroidism of renal origin: Secondary | ICD-10-CM | POA: Diagnosis not present

## 2015-09-19 DIAGNOSIS — Z992 Dependence on renal dialysis: Secondary | ICD-10-CM | POA: Diagnosis not present

## 2015-09-19 DIAGNOSIS — N186 End stage renal disease: Secondary | ICD-10-CM | POA: Diagnosis not present

## 2015-09-19 DIAGNOSIS — D509 Iron deficiency anemia, unspecified: Secondary | ICD-10-CM | POA: Diagnosis not present

## 2015-09-19 DIAGNOSIS — N2581 Secondary hyperparathyroidism of renal origin: Secondary | ICD-10-CM | POA: Diagnosis not present

## 2015-09-19 DIAGNOSIS — D631 Anemia in chronic kidney disease: Secondary | ICD-10-CM | POA: Diagnosis not present

## 2015-09-22 DIAGNOSIS — N186 End stage renal disease: Secondary | ICD-10-CM | POA: Diagnosis not present

## 2015-09-22 DIAGNOSIS — Z992 Dependence on renal dialysis: Secondary | ICD-10-CM | POA: Diagnosis not present

## 2015-09-22 DIAGNOSIS — D631 Anemia in chronic kidney disease: Secondary | ICD-10-CM | POA: Diagnosis not present

## 2015-09-22 DIAGNOSIS — N2581 Secondary hyperparathyroidism of renal origin: Secondary | ICD-10-CM | POA: Diagnosis not present

## 2015-09-22 DIAGNOSIS — D509 Iron deficiency anemia, unspecified: Secondary | ICD-10-CM | POA: Diagnosis not present

## 2015-09-24 DIAGNOSIS — Z992 Dependence on renal dialysis: Secondary | ICD-10-CM | POA: Diagnosis not present

## 2015-09-24 DIAGNOSIS — D509 Iron deficiency anemia, unspecified: Secondary | ICD-10-CM | POA: Diagnosis not present

## 2015-09-24 DIAGNOSIS — D631 Anemia in chronic kidney disease: Secondary | ICD-10-CM | POA: Diagnosis not present

## 2015-09-24 DIAGNOSIS — N2581 Secondary hyperparathyroidism of renal origin: Secondary | ICD-10-CM | POA: Diagnosis not present

## 2015-09-24 DIAGNOSIS — N186 End stage renal disease: Secondary | ICD-10-CM | POA: Diagnosis not present

## 2015-09-26 DIAGNOSIS — D509 Iron deficiency anemia, unspecified: Secondary | ICD-10-CM | POA: Diagnosis not present

## 2015-09-26 DIAGNOSIS — N186 End stage renal disease: Secondary | ICD-10-CM | POA: Diagnosis not present

## 2015-09-26 DIAGNOSIS — D631 Anemia in chronic kidney disease: Secondary | ICD-10-CM | POA: Diagnosis not present

## 2015-09-26 DIAGNOSIS — Z992 Dependence on renal dialysis: Secondary | ICD-10-CM | POA: Diagnosis not present

## 2015-09-26 DIAGNOSIS — N2581 Secondary hyperparathyroidism of renal origin: Secondary | ICD-10-CM | POA: Diagnosis not present

## 2015-09-29 DIAGNOSIS — D631 Anemia in chronic kidney disease: Secondary | ICD-10-CM | POA: Diagnosis not present

## 2015-09-29 DIAGNOSIS — N186 End stage renal disease: Secondary | ICD-10-CM | POA: Diagnosis not present

## 2015-09-29 DIAGNOSIS — Z992 Dependence on renal dialysis: Secondary | ICD-10-CM | POA: Diagnosis not present

## 2015-09-29 DIAGNOSIS — N2581 Secondary hyperparathyroidism of renal origin: Secondary | ICD-10-CM | POA: Diagnosis not present

## 2015-09-29 DIAGNOSIS — D509 Iron deficiency anemia, unspecified: Secondary | ICD-10-CM | POA: Diagnosis not present

## 2015-10-01 DIAGNOSIS — D509 Iron deficiency anemia, unspecified: Secondary | ICD-10-CM | POA: Diagnosis not present

## 2015-10-01 DIAGNOSIS — Z992 Dependence on renal dialysis: Secondary | ICD-10-CM | POA: Diagnosis not present

## 2015-10-01 DIAGNOSIS — N186 End stage renal disease: Secondary | ICD-10-CM | POA: Diagnosis not present

## 2015-10-01 DIAGNOSIS — N2581 Secondary hyperparathyroidism of renal origin: Secondary | ICD-10-CM | POA: Diagnosis not present

## 2015-10-01 DIAGNOSIS — D631 Anemia in chronic kidney disease: Secondary | ICD-10-CM | POA: Diagnosis not present

## 2015-10-03 DIAGNOSIS — D631 Anemia in chronic kidney disease: Secondary | ICD-10-CM | POA: Diagnosis not present

## 2015-10-03 DIAGNOSIS — D509 Iron deficiency anemia, unspecified: Secondary | ICD-10-CM | POA: Diagnosis not present

## 2015-10-03 DIAGNOSIS — Z992 Dependence on renal dialysis: Secondary | ICD-10-CM | POA: Diagnosis not present

## 2015-10-03 DIAGNOSIS — N2581 Secondary hyperparathyroidism of renal origin: Secondary | ICD-10-CM | POA: Diagnosis not present

## 2015-10-03 DIAGNOSIS — N186 End stage renal disease: Secondary | ICD-10-CM | POA: Diagnosis not present

## 2015-10-06 DIAGNOSIS — D509 Iron deficiency anemia, unspecified: Secondary | ICD-10-CM | POA: Diagnosis not present

## 2015-10-06 DIAGNOSIS — N2581 Secondary hyperparathyroidism of renal origin: Secondary | ICD-10-CM | POA: Diagnosis not present

## 2015-10-06 DIAGNOSIS — D631 Anemia in chronic kidney disease: Secondary | ICD-10-CM | POA: Diagnosis not present

## 2015-10-06 DIAGNOSIS — N186 End stage renal disease: Secondary | ICD-10-CM | POA: Diagnosis not present

## 2015-10-06 DIAGNOSIS — Z992 Dependence on renal dialysis: Secondary | ICD-10-CM | POA: Diagnosis not present

## 2015-10-08 DIAGNOSIS — D631 Anemia in chronic kidney disease: Secondary | ICD-10-CM | POA: Diagnosis not present

## 2015-10-08 DIAGNOSIS — N186 End stage renal disease: Secondary | ICD-10-CM | POA: Diagnosis not present

## 2015-10-08 DIAGNOSIS — N2581 Secondary hyperparathyroidism of renal origin: Secondary | ICD-10-CM | POA: Diagnosis not present

## 2015-10-08 DIAGNOSIS — D509 Iron deficiency anemia, unspecified: Secondary | ICD-10-CM | POA: Diagnosis not present

## 2015-10-08 DIAGNOSIS — Z992 Dependence on renal dialysis: Secondary | ICD-10-CM | POA: Diagnosis not present

## 2015-10-10 DIAGNOSIS — N186 End stage renal disease: Secondary | ICD-10-CM | POA: Diagnosis not present

## 2015-10-10 DIAGNOSIS — Z992 Dependence on renal dialysis: Secondary | ICD-10-CM | POA: Diagnosis not present

## 2015-10-10 DIAGNOSIS — D509 Iron deficiency anemia, unspecified: Secondary | ICD-10-CM | POA: Diagnosis not present

## 2015-10-10 DIAGNOSIS — N2581 Secondary hyperparathyroidism of renal origin: Secondary | ICD-10-CM | POA: Diagnosis not present

## 2015-10-10 DIAGNOSIS — D631 Anemia in chronic kidney disease: Secondary | ICD-10-CM | POA: Diagnosis not present

## 2015-10-13 DIAGNOSIS — N2581 Secondary hyperparathyroidism of renal origin: Secondary | ICD-10-CM | POA: Diagnosis not present

## 2015-10-13 DIAGNOSIS — D631 Anemia in chronic kidney disease: Secondary | ICD-10-CM | POA: Diagnosis not present

## 2015-10-13 DIAGNOSIS — D509 Iron deficiency anemia, unspecified: Secondary | ICD-10-CM | POA: Diagnosis not present

## 2015-10-13 DIAGNOSIS — N186 End stage renal disease: Secondary | ICD-10-CM | POA: Diagnosis not present

## 2015-10-13 DIAGNOSIS — Z992 Dependence on renal dialysis: Secondary | ICD-10-CM | POA: Diagnosis not present

## 2015-10-15 DIAGNOSIS — Z992 Dependence on renal dialysis: Secondary | ICD-10-CM | POA: Diagnosis not present

## 2015-10-15 DIAGNOSIS — N186 End stage renal disease: Secondary | ICD-10-CM | POA: Diagnosis not present

## 2015-10-15 DIAGNOSIS — D631 Anemia in chronic kidney disease: Secondary | ICD-10-CM | POA: Diagnosis not present

## 2015-10-17 DIAGNOSIS — Z992 Dependence on renal dialysis: Secondary | ICD-10-CM | POA: Diagnosis not present

## 2015-10-17 DIAGNOSIS — N186 End stage renal disease: Secondary | ICD-10-CM | POA: Diagnosis not present

## 2015-10-17 DIAGNOSIS — D631 Anemia in chronic kidney disease: Secondary | ICD-10-CM | POA: Diagnosis not present

## 2015-10-20 DIAGNOSIS — D631 Anemia in chronic kidney disease: Secondary | ICD-10-CM | POA: Diagnosis not present

## 2015-10-20 DIAGNOSIS — N186 End stage renal disease: Secondary | ICD-10-CM | POA: Diagnosis not present

## 2015-10-20 DIAGNOSIS — Z992 Dependence on renal dialysis: Secondary | ICD-10-CM | POA: Diagnosis not present

## 2015-10-22 DIAGNOSIS — Z992 Dependence on renal dialysis: Secondary | ICD-10-CM | POA: Diagnosis not present

## 2015-10-22 DIAGNOSIS — N186 End stage renal disease: Secondary | ICD-10-CM | POA: Diagnosis not present

## 2015-10-22 DIAGNOSIS — D631 Anemia in chronic kidney disease: Secondary | ICD-10-CM | POA: Diagnosis not present

## 2015-10-24 DIAGNOSIS — N186 End stage renal disease: Secondary | ICD-10-CM | POA: Diagnosis not present

## 2015-10-24 DIAGNOSIS — D631 Anemia in chronic kidney disease: Secondary | ICD-10-CM | POA: Diagnosis not present

## 2015-10-24 DIAGNOSIS — Z992 Dependence on renal dialysis: Secondary | ICD-10-CM | POA: Diagnosis not present

## 2015-10-27 DIAGNOSIS — D631 Anemia in chronic kidney disease: Secondary | ICD-10-CM | POA: Diagnosis not present

## 2015-10-27 DIAGNOSIS — N186 End stage renal disease: Secondary | ICD-10-CM | POA: Diagnosis not present

## 2015-10-27 DIAGNOSIS — Z992 Dependence on renal dialysis: Secondary | ICD-10-CM | POA: Diagnosis not present

## 2015-10-28 DIAGNOSIS — Y841 Kidney dialysis as the cause of abnormal reaction of the patient, or of later complication, without mention of misadventure at the time of the procedure: Secondary | ICD-10-CM | POA: Diagnosis not present

## 2015-10-28 DIAGNOSIS — T82858A Stenosis of vascular prosthetic devices, implants and grafts, initial encounter: Secondary | ICD-10-CM | POA: Diagnosis not present

## 2015-10-28 DIAGNOSIS — N186 End stage renal disease: Secondary | ICD-10-CM | POA: Diagnosis not present

## 2015-10-28 DIAGNOSIS — Z992 Dependence on renal dialysis: Secondary | ICD-10-CM | POA: Diagnosis not present

## 2015-10-28 DIAGNOSIS — T82318A Breakdown (mechanical) of other vascular grafts, initial encounter: Secondary | ICD-10-CM | POA: Diagnosis not present

## 2015-10-29 DIAGNOSIS — D631 Anemia in chronic kidney disease: Secondary | ICD-10-CM | POA: Diagnosis not present

## 2015-10-29 DIAGNOSIS — Z992 Dependence on renal dialysis: Secondary | ICD-10-CM | POA: Diagnosis not present

## 2015-10-29 DIAGNOSIS — N186 End stage renal disease: Secondary | ICD-10-CM | POA: Diagnosis not present

## 2015-10-31 DIAGNOSIS — N186 End stage renal disease: Secondary | ICD-10-CM | POA: Diagnosis not present

## 2015-10-31 DIAGNOSIS — Z992 Dependence on renal dialysis: Secondary | ICD-10-CM | POA: Diagnosis not present

## 2015-10-31 DIAGNOSIS — D631 Anemia in chronic kidney disease: Secondary | ICD-10-CM | POA: Diagnosis not present

## 2015-11-03 DIAGNOSIS — D631 Anemia in chronic kidney disease: Secondary | ICD-10-CM | POA: Diagnosis not present

## 2015-11-03 DIAGNOSIS — Z992 Dependence on renal dialysis: Secondary | ICD-10-CM | POA: Diagnosis not present

## 2015-11-03 DIAGNOSIS — N186 End stage renal disease: Secondary | ICD-10-CM | POA: Diagnosis not present

## 2015-11-05 DIAGNOSIS — N186 End stage renal disease: Secondary | ICD-10-CM | POA: Diagnosis not present

## 2015-11-05 DIAGNOSIS — D631 Anemia in chronic kidney disease: Secondary | ICD-10-CM | POA: Diagnosis not present

## 2015-11-05 DIAGNOSIS — Z992 Dependence on renal dialysis: Secondary | ICD-10-CM | POA: Diagnosis not present

## 2015-11-07 DIAGNOSIS — D631 Anemia in chronic kidney disease: Secondary | ICD-10-CM | POA: Diagnosis not present

## 2015-11-07 DIAGNOSIS — Z992 Dependence on renal dialysis: Secondary | ICD-10-CM | POA: Diagnosis not present

## 2015-11-07 DIAGNOSIS — N186 End stage renal disease: Secondary | ICD-10-CM | POA: Diagnosis not present

## 2015-11-10 DIAGNOSIS — N186 End stage renal disease: Secondary | ICD-10-CM | POA: Diagnosis not present

## 2015-11-10 DIAGNOSIS — D631 Anemia in chronic kidney disease: Secondary | ICD-10-CM | POA: Diagnosis not present

## 2015-11-10 DIAGNOSIS — Z992 Dependence on renal dialysis: Secondary | ICD-10-CM | POA: Diagnosis not present

## 2015-11-12 DIAGNOSIS — D509 Iron deficiency anemia, unspecified: Secondary | ICD-10-CM | POA: Diagnosis not present

## 2015-11-12 DIAGNOSIS — D631 Anemia in chronic kidney disease: Secondary | ICD-10-CM | POA: Diagnosis not present

## 2015-11-12 DIAGNOSIS — N186 End stage renal disease: Secondary | ICD-10-CM | POA: Diagnosis not present

## 2015-11-12 DIAGNOSIS — Z992 Dependence on renal dialysis: Secondary | ICD-10-CM | POA: Diagnosis not present

## 2015-11-14 DIAGNOSIS — D631 Anemia in chronic kidney disease: Secondary | ICD-10-CM | POA: Diagnosis not present

## 2015-11-14 DIAGNOSIS — D509 Iron deficiency anemia, unspecified: Secondary | ICD-10-CM | POA: Diagnosis not present

## 2015-11-14 DIAGNOSIS — Z992 Dependence on renal dialysis: Secondary | ICD-10-CM | POA: Diagnosis not present

## 2015-11-14 DIAGNOSIS — N186 End stage renal disease: Secondary | ICD-10-CM | POA: Diagnosis not present

## 2015-11-17 DIAGNOSIS — Z992 Dependence on renal dialysis: Secondary | ICD-10-CM | POA: Diagnosis not present

## 2015-11-17 DIAGNOSIS — D631 Anemia in chronic kidney disease: Secondary | ICD-10-CM | POA: Diagnosis not present

## 2015-11-17 DIAGNOSIS — N186 End stage renal disease: Secondary | ICD-10-CM | POA: Diagnosis not present

## 2015-11-17 DIAGNOSIS — D509 Iron deficiency anemia, unspecified: Secondary | ICD-10-CM | POA: Diagnosis not present

## 2015-11-19 DIAGNOSIS — D631 Anemia in chronic kidney disease: Secondary | ICD-10-CM | POA: Diagnosis not present

## 2015-11-19 DIAGNOSIS — D509 Iron deficiency anemia, unspecified: Secondary | ICD-10-CM | POA: Diagnosis not present

## 2015-11-19 DIAGNOSIS — N186 End stage renal disease: Secondary | ICD-10-CM | POA: Diagnosis not present

## 2015-11-19 DIAGNOSIS — Z992 Dependence on renal dialysis: Secondary | ICD-10-CM | POA: Diagnosis not present

## 2015-11-21 DIAGNOSIS — Z992 Dependence on renal dialysis: Secondary | ICD-10-CM | POA: Diagnosis not present

## 2015-11-21 DIAGNOSIS — D509 Iron deficiency anemia, unspecified: Secondary | ICD-10-CM | POA: Diagnosis not present

## 2015-11-21 DIAGNOSIS — D631 Anemia in chronic kidney disease: Secondary | ICD-10-CM | POA: Diagnosis not present

## 2015-11-21 DIAGNOSIS — N186 End stage renal disease: Secondary | ICD-10-CM | POA: Diagnosis not present

## 2015-11-24 DIAGNOSIS — N186 End stage renal disease: Secondary | ICD-10-CM | POA: Diagnosis not present

## 2015-11-24 DIAGNOSIS — Z992 Dependence on renal dialysis: Secondary | ICD-10-CM | POA: Diagnosis not present

## 2015-11-24 DIAGNOSIS — D509 Iron deficiency anemia, unspecified: Secondary | ICD-10-CM | POA: Diagnosis not present

## 2015-11-24 DIAGNOSIS — D631 Anemia in chronic kidney disease: Secondary | ICD-10-CM | POA: Diagnosis not present

## 2015-11-26 DIAGNOSIS — D509 Iron deficiency anemia, unspecified: Secondary | ICD-10-CM | POA: Diagnosis not present

## 2015-11-26 DIAGNOSIS — N186 End stage renal disease: Secondary | ICD-10-CM | POA: Diagnosis not present

## 2015-11-26 DIAGNOSIS — D631 Anemia in chronic kidney disease: Secondary | ICD-10-CM | POA: Diagnosis not present

## 2015-11-26 DIAGNOSIS — Z992 Dependence on renal dialysis: Secondary | ICD-10-CM | POA: Diagnosis not present

## 2015-11-28 DIAGNOSIS — N186 End stage renal disease: Secondary | ICD-10-CM | POA: Diagnosis not present

## 2015-11-28 DIAGNOSIS — Z992 Dependence on renal dialysis: Secondary | ICD-10-CM | POA: Diagnosis not present

## 2015-11-28 DIAGNOSIS — D509 Iron deficiency anemia, unspecified: Secondary | ICD-10-CM | POA: Diagnosis not present

## 2015-11-28 DIAGNOSIS — D631 Anemia in chronic kidney disease: Secondary | ICD-10-CM | POA: Diagnosis not present

## 2015-12-01 DIAGNOSIS — D631 Anemia in chronic kidney disease: Secondary | ICD-10-CM | POA: Diagnosis not present

## 2015-12-01 DIAGNOSIS — Z992 Dependence on renal dialysis: Secondary | ICD-10-CM | POA: Diagnosis not present

## 2015-12-01 DIAGNOSIS — D509 Iron deficiency anemia, unspecified: Secondary | ICD-10-CM | POA: Diagnosis not present

## 2015-12-01 DIAGNOSIS — N186 End stage renal disease: Secondary | ICD-10-CM | POA: Diagnosis not present

## 2015-12-03 DIAGNOSIS — D509 Iron deficiency anemia, unspecified: Secondary | ICD-10-CM | POA: Diagnosis not present

## 2015-12-03 DIAGNOSIS — Z992 Dependence on renal dialysis: Secondary | ICD-10-CM | POA: Diagnosis not present

## 2015-12-03 DIAGNOSIS — D631 Anemia in chronic kidney disease: Secondary | ICD-10-CM | POA: Diagnosis not present

## 2015-12-03 DIAGNOSIS — N186 End stage renal disease: Secondary | ICD-10-CM | POA: Diagnosis not present

## 2015-12-04 DIAGNOSIS — N186 End stage renal disease: Secondary | ICD-10-CM | POA: Diagnosis not present

## 2015-12-04 DIAGNOSIS — E785 Hyperlipidemia, unspecified: Secondary | ICD-10-CM | POA: Diagnosis not present

## 2015-12-04 DIAGNOSIS — T82858A Stenosis of vascular prosthetic devices, implants and grafts, initial encounter: Secondary | ICD-10-CM | POA: Diagnosis not present

## 2015-12-04 DIAGNOSIS — Y841 Kidney dialysis as the cause of abnormal reaction of the patient, or of later complication, without mention of misadventure at the time of the procedure: Secondary | ICD-10-CM | POA: Diagnosis not present

## 2015-12-04 DIAGNOSIS — Z992 Dependence on renal dialysis: Secondary | ICD-10-CM | POA: Diagnosis not present

## 2015-12-04 DIAGNOSIS — T82318A Breakdown (mechanical) of other vascular grafts, initial encounter: Secondary | ICD-10-CM | POA: Diagnosis not present

## 2015-12-05 DIAGNOSIS — Z992 Dependence on renal dialysis: Secondary | ICD-10-CM | POA: Diagnosis not present

## 2015-12-05 DIAGNOSIS — D509 Iron deficiency anemia, unspecified: Secondary | ICD-10-CM | POA: Diagnosis not present

## 2015-12-05 DIAGNOSIS — D631 Anemia in chronic kidney disease: Secondary | ICD-10-CM | POA: Diagnosis not present

## 2015-12-05 DIAGNOSIS — N186 End stage renal disease: Secondary | ICD-10-CM | POA: Diagnosis not present

## 2015-12-08 DIAGNOSIS — Z992 Dependence on renal dialysis: Secondary | ICD-10-CM | POA: Diagnosis not present

## 2015-12-08 DIAGNOSIS — D509 Iron deficiency anemia, unspecified: Secondary | ICD-10-CM | POA: Diagnosis not present

## 2015-12-08 DIAGNOSIS — N186 End stage renal disease: Secondary | ICD-10-CM | POA: Diagnosis not present

## 2015-12-08 DIAGNOSIS — D631 Anemia in chronic kidney disease: Secondary | ICD-10-CM | POA: Diagnosis not present

## 2015-12-10 DIAGNOSIS — Z992 Dependence on renal dialysis: Secondary | ICD-10-CM | POA: Diagnosis not present

## 2015-12-10 DIAGNOSIS — D631 Anemia in chronic kidney disease: Secondary | ICD-10-CM | POA: Diagnosis not present

## 2015-12-10 DIAGNOSIS — N186 End stage renal disease: Secondary | ICD-10-CM | POA: Diagnosis not present

## 2015-12-10 DIAGNOSIS — D509 Iron deficiency anemia, unspecified: Secondary | ICD-10-CM | POA: Diagnosis not present

## 2015-12-12 DIAGNOSIS — Z992 Dependence on renal dialysis: Secondary | ICD-10-CM | POA: Diagnosis not present

## 2015-12-12 DIAGNOSIS — N186 End stage renal disease: Secondary | ICD-10-CM | POA: Diagnosis not present

## 2015-12-12 DIAGNOSIS — D509 Iron deficiency anemia, unspecified: Secondary | ICD-10-CM | POA: Diagnosis not present

## 2015-12-12 DIAGNOSIS — D631 Anemia in chronic kidney disease: Secondary | ICD-10-CM | POA: Diagnosis not present

## 2015-12-13 DIAGNOSIS — N186 End stage renal disease: Secondary | ICD-10-CM | POA: Diagnosis not present

## 2015-12-13 DIAGNOSIS — Z992 Dependence on renal dialysis: Secondary | ICD-10-CM | POA: Diagnosis not present

## 2015-12-15 DIAGNOSIS — D509 Iron deficiency anemia, unspecified: Secondary | ICD-10-CM | POA: Diagnosis not present

## 2015-12-15 DIAGNOSIS — Z992 Dependence on renal dialysis: Secondary | ICD-10-CM | POA: Diagnosis not present

## 2015-12-15 DIAGNOSIS — D631 Anemia in chronic kidney disease: Secondary | ICD-10-CM | POA: Diagnosis not present

## 2015-12-15 DIAGNOSIS — N186 End stage renal disease: Secondary | ICD-10-CM | POA: Diagnosis not present

## 2015-12-17 DIAGNOSIS — N186 End stage renal disease: Secondary | ICD-10-CM | POA: Diagnosis not present

## 2015-12-17 DIAGNOSIS — D509 Iron deficiency anemia, unspecified: Secondary | ICD-10-CM | POA: Diagnosis not present

## 2015-12-17 DIAGNOSIS — Z992 Dependence on renal dialysis: Secondary | ICD-10-CM | POA: Diagnosis not present

## 2015-12-17 DIAGNOSIS — D631 Anemia in chronic kidney disease: Secondary | ICD-10-CM | POA: Diagnosis not present

## 2015-12-19 DIAGNOSIS — Z992 Dependence on renal dialysis: Secondary | ICD-10-CM | POA: Diagnosis not present

## 2015-12-19 DIAGNOSIS — D631 Anemia in chronic kidney disease: Secondary | ICD-10-CM | POA: Diagnosis not present

## 2015-12-19 DIAGNOSIS — D509 Iron deficiency anemia, unspecified: Secondary | ICD-10-CM | POA: Diagnosis not present

## 2015-12-19 DIAGNOSIS — N186 End stage renal disease: Secondary | ICD-10-CM | POA: Diagnosis not present

## 2015-12-22 DIAGNOSIS — D509 Iron deficiency anemia, unspecified: Secondary | ICD-10-CM | POA: Diagnosis not present

## 2015-12-22 DIAGNOSIS — D631 Anemia in chronic kidney disease: Secondary | ICD-10-CM | POA: Diagnosis not present

## 2015-12-22 DIAGNOSIS — N186 End stage renal disease: Secondary | ICD-10-CM | POA: Diagnosis not present

## 2015-12-22 DIAGNOSIS — Z992 Dependence on renal dialysis: Secondary | ICD-10-CM | POA: Diagnosis not present

## 2015-12-24 DIAGNOSIS — N186 End stage renal disease: Secondary | ICD-10-CM | POA: Diagnosis not present

## 2015-12-24 DIAGNOSIS — Z992 Dependence on renal dialysis: Secondary | ICD-10-CM | POA: Diagnosis not present

## 2015-12-24 DIAGNOSIS — D631 Anemia in chronic kidney disease: Secondary | ICD-10-CM | POA: Diagnosis not present

## 2015-12-24 DIAGNOSIS — D509 Iron deficiency anemia, unspecified: Secondary | ICD-10-CM | POA: Diagnosis not present

## 2015-12-26 DIAGNOSIS — D509 Iron deficiency anemia, unspecified: Secondary | ICD-10-CM | POA: Diagnosis not present

## 2015-12-26 DIAGNOSIS — Z992 Dependence on renal dialysis: Secondary | ICD-10-CM | POA: Diagnosis not present

## 2015-12-26 DIAGNOSIS — D631 Anemia in chronic kidney disease: Secondary | ICD-10-CM | POA: Diagnosis not present

## 2015-12-26 DIAGNOSIS — N186 End stage renal disease: Secondary | ICD-10-CM | POA: Diagnosis not present

## 2015-12-29 DIAGNOSIS — D631 Anemia in chronic kidney disease: Secondary | ICD-10-CM | POA: Diagnosis not present

## 2015-12-29 DIAGNOSIS — N186 End stage renal disease: Secondary | ICD-10-CM | POA: Diagnosis not present

## 2015-12-29 DIAGNOSIS — D509 Iron deficiency anemia, unspecified: Secondary | ICD-10-CM | POA: Diagnosis not present

## 2015-12-29 DIAGNOSIS — Z992 Dependence on renal dialysis: Secondary | ICD-10-CM | POA: Diagnosis not present

## 2015-12-31 DIAGNOSIS — D509 Iron deficiency anemia, unspecified: Secondary | ICD-10-CM | POA: Diagnosis not present

## 2015-12-31 DIAGNOSIS — D631 Anemia in chronic kidney disease: Secondary | ICD-10-CM | POA: Diagnosis not present

## 2015-12-31 DIAGNOSIS — N186 End stage renal disease: Secondary | ICD-10-CM | POA: Diagnosis not present

## 2015-12-31 DIAGNOSIS — Z992 Dependence on renal dialysis: Secondary | ICD-10-CM | POA: Diagnosis not present

## 2016-01-02 DIAGNOSIS — N186 End stage renal disease: Secondary | ICD-10-CM | POA: Diagnosis not present

## 2016-01-02 DIAGNOSIS — Z992 Dependence on renal dialysis: Secondary | ICD-10-CM | POA: Diagnosis not present

## 2016-01-02 DIAGNOSIS — D509 Iron deficiency anemia, unspecified: Secondary | ICD-10-CM | POA: Diagnosis not present

## 2016-01-02 DIAGNOSIS — D631 Anemia in chronic kidney disease: Secondary | ICD-10-CM | POA: Diagnosis not present

## 2016-01-05 DIAGNOSIS — D631 Anemia in chronic kidney disease: Secondary | ICD-10-CM | POA: Diagnosis not present

## 2016-01-05 DIAGNOSIS — N186 End stage renal disease: Secondary | ICD-10-CM | POA: Diagnosis not present

## 2016-01-05 DIAGNOSIS — D509 Iron deficiency anemia, unspecified: Secondary | ICD-10-CM | POA: Diagnosis not present

## 2016-01-05 DIAGNOSIS — Z992 Dependence on renal dialysis: Secondary | ICD-10-CM | POA: Diagnosis not present

## 2016-01-07 DIAGNOSIS — Z992 Dependence on renal dialysis: Secondary | ICD-10-CM | POA: Diagnosis not present

## 2016-01-07 DIAGNOSIS — N186 End stage renal disease: Secondary | ICD-10-CM | POA: Diagnosis not present

## 2016-01-07 DIAGNOSIS — D509 Iron deficiency anemia, unspecified: Secondary | ICD-10-CM | POA: Diagnosis not present

## 2016-01-07 DIAGNOSIS — D631 Anemia in chronic kidney disease: Secondary | ICD-10-CM | POA: Diagnosis not present

## 2016-01-09 DIAGNOSIS — Z992 Dependence on renal dialysis: Secondary | ICD-10-CM | POA: Diagnosis not present

## 2016-01-09 DIAGNOSIS — D631 Anemia in chronic kidney disease: Secondary | ICD-10-CM | POA: Diagnosis not present

## 2016-01-09 DIAGNOSIS — N186 End stage renal disease: Secondary | ICD-10-CM | POA: Diagnosis not present

## 2016-01-09 DIAGNOSIS — D509 Iron deficiency anemia, unspecified: Secondary | ICD-10-CM | POA: Diagnosis not present

## 2016-01-12 DIAGNOSIS — Z992 Dependence on renal dialysis: Secondary | ICD-10-CM | POA: Diagnosis not present

## 2016-01-12 DIAGNOSIS — D631 Anemia in chronic kidney disease: Secondary | ICD-10-CM | POA: Diagnosis not present

## 2016-01-12 DIAGNOSIS — N2581 Secondary hyperparathyroidism of renal origin: Secondary | ICD-10-CM | POA: Diagnosis not present

## 2016-01-12 DIAGNOSIS — D509 Iron deficiency anemia, unspecified: Secondary | ICD-10-CM | POA: Diagnosis not present

## 2016-01-12 DIAGNOSIS — N186 End stage renal disease: Secondary | ICD-10-CM | POA: Diagnosis not present

## 2016-01-14 DIAGNOSIS — D509 Iron deficiency anemia, unspecified: Secondary | ICD-10-CM | POA: Diagnosis not present

## 2016-01-14 DIAGNOSIS — D631 Anemia in chronic kidney disease: Secondary | ICD-10-CM | POA: Diagnosis not present

## 2016-01-14 DIAGNOSIS — N2581 Secondary hyperparathyroidism of renal origin: Secondary | ICD-10-CM | POA: Diagnosis not present

## 2016-01-14 DIAGNOSIS — N186 End stage renal disease: Secondary | ICD-10-CM | POA: Diagnosis not present

## 2016-01-14 DIAGNOSIS — Z992 Dependence on renal dialysis: Secondary | ICD-10-CM | POA: Diagnosis not present

## 2016-01-16 DIAGNOSIS — Z992 Dependence on renal dialysis: Secondary | ICD-10-CM | POA: Diagnosis not present

## 2016-01-16 DIAGNOSIS — N186 End stage renal disease: Secondary | ICD-10-CM | POA: Diagnosis not present

## 2016-01-16 DIAGNOSIS — D631 Anemia in chronic kidney disease: Secondary | ICD-10-CM | POA: Diagnosis not present

## 2016-01-16 DIAGNOSIS — N2581 Secondary hyperparathyroidism of renal origin: Secondary | ICD-10-CM | POA: Diagnosis not present

## 2016-01-16 DIAGNOSIS — D509 Iron deficiency anemia, unspecified: Secondary | ICD-10-CM | POA: Diagnosis not present

## 2016-01-19 DIAGNOSIS — N2581 Secondary hyperparathyroidism of renal origin: Secondary | ICD-10-CM | POA: Diagnosis not present

## 2016-01-19 DIAGNOSIS — Z992 Dependence on renal dialysis: Secondary | ICD-10-CM | POA: Diagnosis not present

## 2016-01-19 DIAGNOSIS — D631 Anemia in chronic kidney disease: Secondary | ICD-10-CM | POA: Diagnosis not present

## 2016-01-19 DIAGNOSIS — D509 Iron deficiency anemia, unspecified: Secondary | ICD-10-CM | POA: Diagnosis not present

## 2016-01-19 DIAGNOSIS — N186 End stage renal disease: Secondary | ICD-10-CM | POA: Diagnosis not present

## 2016-01-21 DIAGNOSIS — Z992 Dependence on renal dialysis: Secondary | ICD-10-CM | POA: Diagnosis not present

## 2016-01-21 DIAGNOSIS — N186 End stage renal disease: Secondary | ICD-10-CM | POA: Diagnosis not present

## 2016-01-21 DIAGNOSIS — D631 Anemia in chronic kidney disease: Secondary | ICD-10-CM | POA: Diagnosis not present

## 2016-01-21 DIAGNOSIS — N2581 Secondary hyperparathyroidism of renal origin: Secondary | ICD-10-CM | POA: Diagnosis not present

## 2016-01-21 DIAGNOSIS — D509 Iron deficiency anemia, unspecified: Secondary | ICD-10-CM | POA: Diagnosis not present

## 2016-01-23 DIAGNOSIS — D631 Anemia in chronic kidney disease: Secondary | ICD-10-CM | POA: Diagnosis not present

## 2016-01-23 DIAGNOSIS — Z992 Dependence on renal dialysis: Secondary | ICD-10-CM | POA: Diagnosis not present

## 2016-01-23 DIAGNOSIS — N186 End stage renal disease: Secondary | ICD-10-CM | POA: Diagnosis not present

## 2016-01-23 DIAGNOSIS — N2581 Secondary hyperparathyroidism of renal origin: Secondary | ICD-10-CM | POA: Diagnosis not present

## 2016-01-23 DIAGNOSIS — D509 Iron deficiency anemia, unspecified: Secondary | ICD-10-CM | POA: Diagnosis not present

## 2016-01-26 DIAGNOSIS — Z992 Dependence on renal dialysis: Secondary | ICD-10-CM | POA: Diagnosis not present

## 2016-01-26 DIAGNOSIS — D509 Iron deficiency anemia, unspecified: Secondary | ICD-10-CM | POA: Diagnosis not present

## 2016-01-26 DIAGNOSIS — D631 Anemia in chronic kidney disease: Secondary | ICD-10-CM | POA: Diagnosis not present

## 2016-01-26 DIAGNOSIS — N186 End stage renal disease: Secondary | ICD-10-CM | POA: Diagnosis not present

## 2016-01-26 DIAGNOSIS — N2581 Secondary hyperparathyroidism of renal origin: Secondary | ICD-10-CM | POA: Diagnosis not present

## 2016-01-28 DIAGNOSIS — D631 Anemia in chronic kidney disease: Secondary | ICD-10-CM | POA: Diagnosis not present

## 2016-01-28 DIAGNOSIS — N186 End stage renal disease: Secondary | ICD-10-CM | POA: Diagnosis not present

## 2016-01-28 DIAGNOSIS — D509 Iron deficiency anemia, unspecified: Secondary | ICD-10-CM | POA: Diagnosis not present

## 2016-01-28 DIAGNOSIS — N2581 Secondary hyperparathyroidism of renal origin: Secondary | ICD-10-CM | POA: Diagnosis not present

## 2016-01-28 DIAGNOSIS — Z992 Dependence on renal dialysis: Secondary | ICD-10-CM | POA: Diagnosis not present

## 2016-01-30 DIAGNOSIS — Z992 Dependence on renal dialysis: Secondary | ICD-10-CM | POA: Diagnosis not present

## 2016-01-30 DIAGNOSIS — N186 End stage renal disease: Secondary | ICD-10-CM | POA: Diagnosis not present

## 2016-01-30 DIAGNOSIS — D509 Iron deficiency anemia, unspecified: Secondary | ICD-10-CM | POA: Diagnosis not present

## 2016-01-30 DIAGNOSIS — D631 Anemia in chronic kidney disease: Secondary | ICD-10-CM | POA: Diagnosis not present

## 2016-01-30 DIAGNOSIS — N2581 Secondary hyperparathyroidism of renal origin: Secondary | ICD-10-CM | POA: Diagnosis not present

## 2016-02-02 DIAGNOSIS — N2581 Secondary hyperparathyroidism of renal origin: Secondary | ICD-10-CM | POA: Diagnosis not present

## 2016-02-02 DIAGNOSIS — Z992 Dependence on renal dialysis: Secondary | ICD-10-CM | POA: Diagnosis not present

## 2016-02-02 DIAGNOSIS — D509 Iron deficiency anemia, unspecified: Secondary | ICD-10-CM | POA: Diagnosis not present

## 2016-02-02 DIAGNOSIS — N186 End stage renal disease: Secondary | ICD-10-CM | POA: Diagnosis not present

## 2016-02-02 DIAGNOSIS — D631 Anemia in chronic kidney disease: Secondary | ICD-10-CM | POA: Diagnosis not present

## 2016-02-04 DIAGNOSIS — N2581 Secondary hyperparathyroidism of renal origin: Secondary | ICD-10-CM | POA: Diagnosis not present

## 2016-02-04 DIAGNOSIS — D631 Anemia in chronic kidney disease: Secondary | ICD-10-CM | POA: Diagnosis not present

## 2016-02-04 DIAGNOSIS — N186 End stage renal disease: Secondary | ICD-10-CM | POA: Diagnosis not present

## 2016-02-04 DIAGNOSIS — D509 Iron deficiency anemia, unspecified: Secondary | ICD-10-CM | POA: Diagnosis not present

## 2016-02-04 DIAGNOSIS — Z992 Dependence on renal dialysis: Secondary | ICD-10-CM | POA: Diagnosis not present

## 2016-02-06 DIAGNOSIS — D509 Iron deficiency anemia, unspecified: Secondary | ICD-10-CM | POA: Diagnosis not present

## 2016-02-06 DIAGNOSIS — N2581 Secondary hyperparathyroidism of renal origin: Secondary | ICD-10-CM | POA: Diagnosis not present

## 2016-02-06 DIAGNOSIS — D631 Anemia in chronic kidney disease: Secondary | ICD-10-CM | POA: Diagnosis not present

## 2016-02-06 DIAGNOSIS — Z992 Dependence on renal dialysis: Secondary | ICD-10-CM | POA: Diagnosis not present

## 2016-02-06 DIAGNOSIS — N186 End stage renal disease: Secondary | ICD-10-CM | POA: Diagnosis not present

## 2016-02-09 DIAGNOSIS — Z992 Dependence on renal dialysis: Secondary | ICD-10-CM | POA: Diagnosis not present

## 2016-02-09 DIAGNOSIS — D509 Iron deficiency anemia, unspecified: Secondary | ICD-10-CM | POA: Diagnosis not present

## 2016-02-09 DIAGNOSIS — D631 Anemia in chronic kidney disease: Secondary | ICD-10-CM | POA: Diagnosis not present

## 2016-02-09 DIAGNOSIS — N2581 Secondary hyperparathyroidism of renal origin: Secondary | ICD-10-CM | POA: Diagnosis not present

## 2016-02-09 DIAGNOSIS — N186 End stage renal disease: Secondary | ICD-10-CM | POA: Diagnosis not present

## 2016-02-11 DIAGNOSIS — N186 End stage renal disease: Secondary | ICD-10-CM | POA: Diagnosis not present

## 2016-02-11 DIAGNOSIS — N2581 Secondary hyperparathyroidism of renal origin: Secondary | ICD-10-CM | POA: Diagnosis not present

## 2016-02-11 DIAGNOSIS — Z992 Dependence on renal dialysis: Secondary | ICD-10-CM | POA: Diagnosis not present

## 2016-02-11 DIAGNOSIS — D631 Anemia in chronic kidney disease: Secondary | ICD-10-CM | POA: Diagnosis not present

## 2016-02-11 DIAGNOSIS — D509 Iron deficiency anemia, unspecified: Secondary | ICD-10-CM | POA: Diagnosis not present

## 2016-02-13 DIAGNOSIS — D631 Anemia in chronic kidney disease: Secondary | ICD-10-CM | POA: Diagnosis not present

## 2016-02-13 DIAGNOSIS — N2581 Secondary hyperparathyroidism of renal origin: Secondary | ICD-10-CM | POA: Diagnosis not present

## 2016-02-13 DIAGNOSIS — N186 End stage renal disease: Secondary | ICD-10-CM | POA: Diagnosis not present

## 2016-02-13 DIAGNOSIS — D509 Iron deficiency anemia, unspecified: Secondary | ICD-10-CM | POA: Diagnosis not present

## 2016-02-13 DIAGNOSIS — Z992 Dependence on renal dialysis: Secondary | ICD-10-CM | POA: Diagnosis not present

## 2016-02-16 DIAGNOSIS — Z992 Dependence on renal dialysis: Secondary | ICD-10-CM | POA: Diagnosis not present

## 2016-02-16 DIAGNOSIS — N186 End stage renal disease: Secondary | ICD-10-CM | POA: Diagnosis not present

## 2016-02-16 DIAGNOSIS — N2581 Secondary hyperparathyroidism of renal origin: Secondary | ICD-10-CM | POA: Diagnosis not present

## 2016-02-16 DIAGNOSIS — D631 Anemia in chronic kidney disease: Secondary | ICD-10-CM | POA: Diagnosis not present

## 2016-02-16 DIAGNOSIS — D509 Iron deficiency anemia, unspecified: Secondary | ICD-10-CM | POA: Diagnosis not present

## 2016-02-18 DIAGNOSIS — Z992 Dependence on renal dialysis: Secondary | ICD-10-CM | POA: Diagnosis not present

## 2016-02-18 DIAGNOSIS — N2581 Secondary hyperparathyroidism of renal origin: Secondary | ICD-10-CM | POA: Diagnosis not present

## 2016-02-18 DIAGNOSIS — D631 Anemia in chronic kidney disease: Secondary | ICD-10-CM | POA: Diagnosis not present

## 2016-02-18 DIAGNOSIS — N186 End stage renal disease: Secondary | ICD-10-CM | POA: Diagnosis not present

## 2016-02-18 DIAGNOSIS — D509 Iron deficiency anemia, unspecified: Secondary | ICD-10-CM | POA: Diagnosis not present

## 2016-02-20 DIAGNOSIS — N2581 Secondary hyperparathyroidism of renal origin: Secondary | ICD-10-CM | POA: Diagnosis not present

## 2016-02-20 DIAGNOSIS — N186 End stage renal disease: Secondary | ICD-10-CM | POA: Diagnosis not present

## 2016-02-20 DIAGNOSIS — D509 Iron deficiency anemia, unspecified: Secondary | ICD-10-CM | POA: Diagnosis not present

## 2016-02-20 DIAGNOSIS — Z992 Dependence on renal dialysis: Secondary | ICD-10-CM | POA: Diagnosis not present

## 2016-02-20 DIAGNOSIS — D631 Anemia in chronic kidney disease: Secondary | ICD-10-CM | POA: Diagnosis not present

## 2016-02-23 DIAGNOSIS — D509 Iron deficiency anemia, unspecified: Secondary | ICD-10-CM | POA: Diagnosis not present

## 2016-02-23 DIAGNOSIS — N186 End stage renal disease: Secondary | ICD-10-CM | POA: Diagnosis not present

## 2016-02-23 DIAGNOSIS — N2581 Secondary hyperparathyroidism of renal origin: Secondary | ICD-10-CM | POA: Diagnosis not present

## 2016-02-23 DIAGNOSIS — Z992 Dependence on renal dialysis: Secondary | ICD-10-CM | POA: Diagnosis not present

## 2016-02-23 DIAGNOSIS — D631 Anemia in chronic kidney disease: Secondary | ICD-10-CM | POA: Diagnosis not present

## 2016-02-25 DIAGNOSIS — D509 Iron deficiency anemia, unspecified: Secondary | ICD-10-CM | POA: Diagnosis not present

## 2016-02-25 DIAGNOSIS — N186 End stage renal disease: Secondary | ICD-10-CM | POA: Diagnosis not present

## 2016-02-25 DIAGNOSIS — N2581 Secondary hyperparathyroidism of renal origin: Secondary | ICD-10-CM | POA: Diagnosis not present

## 2016-02-25 DIAGNOSIS — Z992 Dependence on renal dialysis: Secondary | ICD-10-CM | POA: Diagnosis not present

## 2016-02-25 DIAGNOSIS — D631 Anemia in chronic kidney disease: Secondary | ICD-10-CM | POA: Diagnosis not present

## 2016-02-27 DIAGNOSIS — Z992 Dependence on renal dialysis: Secondary | ICD-10-CM | POA: Diagnosis not present

## 2016-02-27 DIAGNOSIS — N186 End stage renal disease: Secondary | ICD-10-CM | POA: Diagnosis not present

## 2016-02-27 DIAGNOSIS — D509 Iron deficiency anemia, unspecified: Secondary | ICD-10-CM | POA: Diagnosis not present

## 2016-02-27 DIAGNOSIS — D631 Anemia in chronic kidney disease: Secondary | ICD-10-CM | POA: Diagnosis not present

## 2016-02-27 DIAGNOSIS — N2581 Secondary hyperparathyroidism of renal origin: Secondary | ICD-10-CM | POA: Diagnosis not present

## 2016-03-01 DIAGNOSIS — N2581 Secondary hyperparathyroidism of renal origin: Secondary | ICD-10-CM | POA: Diagnosis not present

## 2016-03-01 DIAGNOSIS — Z992 Dependence on renal dialysis: Secondary | ICD-10-CM | POA: Diagnosis not present

## 2016-03-01 DIAGNOSIS — N186 End stage renal disease: Secondary | ICD-10-CM | POA: Diagnosis not present

## 2016-03-01 DIAGNOSIS — D509 Iron deficiency anemia, unspecified: Secondary | ICD-10-CM | POA: Diagnosis not present

## 2016-03-01 DIAGNOSIS — D631 Anemia in chronic kidney disease: Secondary | ICD-10-CM | POA: Diagnosis not present

## 2016-03-03 DIAGNOSIS — N186 End stage renal disease: Secondary | ICD-10-CM | POA: Diagnosis not present

## 2016-03-03 DIAGNOSIS — D509 Iron deficiency anemia, unspecified: Secondary | ICD-10-CM | POA: Diagnosis not present

## 2016-03-03 DIAGNOSIS — N2581 Secondary hyperparathyroidism of renal origin: Secondary | ICD-10-CM | POA: Diagnosis not present

## 2016-03-03 DIAGNOSIS — Z992 Dependence on renal dialysis: Secondary | ICD-10-CM | POA: Diagnosis not present

## 2016-03-03 DIAGNOSIS — D631 Anemia in chronic kidney disease: Secondary | ICD-10-CM | POA: Diagnosis not present

## 2016-03-05 DIAGNOSIS — D631 Anemia in chronic kidney disease: Secondary | ICD-10-CM | POA: Diagnosis not present

## 2016-03-05 DIAGNOSIS — N186 End stage renal disease: Secondary | ICD-10-CM | POA: Diagnosis not present

## 2016-03-05 DIAGNOSIS — D509 Iron deficiency anemia, unspecified: Secondary | ICD-10-CM | POA: Diagnosis not present

## 2016-03-05 DIAGNOSIS — N2581 Secondary hyperparathyroidism of renal origin: Secondary | ICD-10-CM | POA: Diagnosis not present

## 2016-03-05 DIAGNOSIS — Z992 Dependence on renal dialysis: Secondary | ICD-10-CM | POA: Diagnosis not present

## 2016-03-08 DIAGNOSIS — N186 End stage renal disease: Secondary | ICD-10-CM | POA: Diagnosis not present

## 2016-03-08 DIAGNOSIS — D509 Iron deficiency anemia, unspecified: Secondary | ICD-10-CM | POA: Diagnosis not present

## 2016-03-08 DIAGNOSIS — N2581 Secondary hyperparathyroidism of renal origin: Secondary | ICD-10-CM | POA: Diagnosis not present

## 2016-03-08 DIAGNOSIS — D631 Anemia in chronic kidney disease: Secondary | ICD-10-CM | POA: Diagnosis not present

## 2016-03-08 DIAGNOSIS — Z992 Dependence on renal dialysis: Secondary | ICD-10-CM | POA: Diagnosis not present

## 2016-03-10 DIAGNOSIS — Z992 Dependence on renal dialysis: Secondary | ICD-10-CM | POA: Diagnosis not present

## 2016-03-10 DIAGNOSIS — N2581 Secondary hyperparathyroidism of renal origin: Secondary | ICD-10-CM | POA: Diagnosis not present

## 2016-03-10 DIAGNOSIS — N186 End stage renal disease: Secondary | ICD-10-CM | POA: Diagnosis not present

## 2016-03-10 DIAGNOSIS — D631 Anemia in chronic kidney disease: Secondary | ICD-10-CM | POA: Diagnosis not present

## 2016-03-10 DIAGNOSIS — D509 Iron deficiency anemia, unspecified: Secondary | ICD-10-CM | POA: Diagnosis not present

## 2016-03-12 DIAGNOSIS — D631 Anemia in chronic kidney disease: Secondary | ICD-10-CM | POA: Diagnosis not present

## 2016-03-12 DIAGNOSIS — N2581 Secondary hyperparathyroidism of renal origin: Secondary | ICD-10-CM | POA: Diagnosis not present

## 2016-03-12 DIAGNOSIS — D509 Iron deficiency anemia, unspecified: Secondary | ICD-10-CM | POA: Diagnosis not present

## 2016-03-12 DIAGNOSIS — N186 End stage renal disease: Secondary | ICD-10-CM | POA: Diagnosis not present

## 2016-03-12 DIAGNOSIS — Z992 Dependence on renal dialysis: Secondary | ICD-10-CM | POA: Diagnosis not present

## 2016-03-15 DIAGNOSIS — N2581 Secondary hyperparathyroidism of renal origin: Secondary | ICD-10-CM | POA: Diagnosis not present

## 2016-03-15 DIAGNOSIS — D631 Anemia in chronic kidney disease: Secondary | ICD-10-CM | POA: Diagnosis not present

## 2016-03-15 DIAGNOSIS — Z992 Dependence on renal dialysis: Secondary | ICD-10-CM | POA: Diagnosis not present

## 2016-03-15 DIAGNOSIS — N186 End stage renal disease: Secondary | ICD-10-CM | POA: Diagnosis not present

## 2016-03-15 DIAGNOSIS — D509 Iron deficiency anemia, unspecified: Secondary | ICD-10-CM | POA: Diagnosis not present

## 2016-03-17 DIAGNOSIS — N186 End stage renal disease: Secondary | ICD-10-CM | POA: Diagnosis not present

## 2016-03-17 DIAGNOSIS — D509 Iron deficiency anemia, unspecified: Secondary | ICD-10-CM | POA: Diagnosis not present

## 2016-03-17 DIAGNOSIS — N2581 Secondary hyperparathyroidism of renal origin: Secondary | ICD-10-CM | POA: Diagnosis not present

## 2016-03-17 DIAGNOSIS — D631 Anemia in chronic kidney disease: Secondary | ICD-10-CM | POA: Diagnosis not present

## 2016-03-17 DIAGNOSIS — Z992 Dependence on renal dialysis: Secondary | ICD-10-CM | POA: Diagnosis not present

## 2016-03-19 DIAGNOSIS — D509 Iron deficiency anemia, unspecified: Secondary | ICD-10-CM | POA: Diagnosis not present

## 2016-03-19 DIAGNOSIS — N2581 Secondary hyperparathyroidism of renal origin: Secondary | ICD-10-CM | POA: Diagnosis not present

## 2016-03-19 DIAGNOSIS — N186 End stage renal disease: Secondary | ICD-10-CM | POA: Diagnosis not present

## 2016-03-19 DIAGNOSIS — Z992 Dependence on renal dialysis: Secondary | ICD-10-CM | POA: Diagnosis not present

## 2016-03-19 DIAGNOSIS — D631 Anemia in chronic kidney disease: Secondary | ICD-10-CM | POA: Diagnosis not present

## 2016-03-22 DIAGNOSIS — N186 End stage renal disease: Secondary | ICD-10-CM | POA: Diagnosis not present

## 2016-03-22 DIAGNOSIS — Z992 Dependence on renal dialysis: Secondary | ICD-10-CM | POA: Diagnosis not present

## 2016-03-22 DIAGNOSIS — D631 Anemia in chronic kidney disease: Secondary | ICD-10-CM | POA: Diagnosis not present

## 2016-03-22 DIAGNOSIS — N2581 Secondary hyperparathyroidism of renal origin: Secondary | ICD-10-CM | POA: Diagnosis not present

## 2016-03-22 DIAGNOSIS — D509 Iron deficiency anemia, unspecified: Secondary | ICD-10-CM | POA: Diagnosis not present

## 2016-03-24 DIAGNOSIS — D631 Anemia in chronic kidney disease: Secondary | ICD-10-CM | POA: Diagnosis not present

## 2016-03-24 DIAGNOSIS — Z992 Dependence on renal dialysis: Secondary | ICD-10-CM | POA: Diagnosis not present

## 2016-03-24 DIAGNOSIS — D509 Iron deficiency anemia, unspecified: Secondary | ICD-10-CM | POA: Diagnosis not present

## 2016-03-24 DIAGNOSIS — N2581 Secondary hyperparathyroidism of renal origin: Secondary | ICD-10-CM | POA: Diagnosis not present

## 2016-03-24 DIAGNOSIS — N186 End stage renal disease: Secondary | ICD-10-CM | POA: Diagnosis not present

## 2016-03-26 DIAGNOSIS — D509 Iron deficiency anemia, unspecified: Secondary | ICD-10-CM | POA: Diagnosis not present

## 2016-03-26 DIAGNOSIS — N2581 Secondary hyperparathyroidism of renal origin: Secondary | ICD-10-CM | POA: Diagnosis not present

## 2016-03-26 DIAGNOSIS — Z992 Dependence on renal dialysis: Secondary | ICD-10-CM | POA: Diagnosis not present

## 2016-03-26 DIAGNOSIS — D631 Anemia in chronic kidney disease: Secondary | ICD-10-CM | POA: Diagnosis not present

## 2016-03-26 DIAGNOSIS — N186 End stage renal disease: Secondary | ICD-10-CM | POA: Diagnosis not present

## 2016-03-29 DIAGNOSIS — D631 Anemia in chronic kidney disease: Secondary | ICD-10-CM | POA: Diagnosis not present

## 2016-03-29 DIAGNOSIS — N2581 Secondary hyperparathyroidism of renal origin: Secondary | ICD-10-CM | POA: Diagnosis not present

## 2016-03-29 DIAGNOSIS — N186 End stage renal disease: Secondary | ICD-10-CM | POA: Diagnosis not present

## 2016-03-29 DIAGNOSIS — D509 Iron deficiency anemia, unspecified: Secondary | ICD-10-CM | POA: Diagnosis not present

## 2016-03-29 DIAGNOSIS — Z992 Dependence on renal dialysis: Secondary | ICD-10-CM | POA: Diagnosis not present

## 2016-03-30 DIAGNOSIS — T82858A Stenosis of vascular prosthetic devices, implants and grafts, initial encounter: Secondary | ICD-10-CM | POA: Diagnosis not present

## 2016-03-30 DIAGNOSIS — T82318A Breakdown (mechanical) of other vascular grafts, initial encounter: Secondary | ICD-10-CM | POA: Diagnosis not present

## 2016-03-30 DIAGNOSIS — E785 Hyperlipidemia, unspecified: Secondary | ICD-10-CM | POA: Diagnosis not present

## 2016-03-30 DIAGNOSIS — Z992 Dependence on renal dialysis: Secondary | ICD-10-CM | POA: Diagnosis not present

## 2016-03-30 DIAGNOSIS — Y841 Kidney dialysis as the cause of abnormal reaction of the patient, or of later complication, without mention of misadventure at the time of the procedure: Secondary | ICD-10-CM | POA: Diagnosis not present

## 2016-03-30 DIAGNOSIS — N186 End stage renal disease: Secondary | ICD-10-CM | POA: Diagnosis not present

## 2016-03-31 DIAGNOSIS — D509 Iron deficiency anemia, unspecified: Secondary | ICD-10-CM | POA: Diagnosis not present

## 2016-03-31 DIAGNOSIS — N2581 Secondary hyperparathyroidism of renal origin: Secondary | ICD-10-CM | POA: Diagnosis not present

## 2016-03-31 DIAGNOSIS — D631 Anemia in chronic kidney disease: Secondary | ICD-10-CM | POA: Diagnosis not present

## 2016-03-31 DIAGNOSIS — Z992 Dependence on renal dialysis: Secondary | ICD-10-CM | POA: Diagnosis not present

## 2016-03-31 DIAGNOSIS — N186 End stage renal disease: Secondary | ICD-10-CM | POA: Diagnosis not present

## 2016-04-02 DIAGNOSIS — D631 Anemia in chronic kidney disease: Secondary | ICD-10-CM | POA: Diagnosis not present

## 2016-04-02 DIAGNOSIS — N2581 Secondary hyperparathyroidism of renal origin: Secondary | ICD-10-CM | POA: Diagnosis not present

## 2016-04-02 DIAGNOSIS — Z992 Dependence on renal dialysis: Secondary | ICD-10-CM | POA: Diagnosis not present

## 2016-04-02 DIAGNOSIS — N186 End stage renal disease: Secondary | ICD-10-CM | POA: Diagnosis not present

## 2016-04-02 DIAGNOSIS — D509 Iron deficiency anemia, unspecified: Secondary | ICD-10-CM | POA: Diagnosis not present

## 2016-04-05 DIAGNOSIS — D509 Iron deficiency anemia, unspecified: Secondary | ICD-10-CM | POA: Diagnosis not present

## 2016-04-05 DIAGNOSIS — N186 End stage renal disease: Secondary | ICD-10-CM | POA: Diagnosis not present

## 2016-04-05 DIAGNOSIS — Z992 Dependence on renal dialysis: Secondary | ICD-10-CM | POA: Diagnosis not present

## 2016-04-05 DIAGNOSIS — D631 Anemia in chronic kidney disease: Secondary | ICD-10-CM | POA: Diagnosis not present

## 2016-04-05 DIAGNOSIS — N2581 Secondary hyperparathyroidism of renal origin: Secondary | ICD-10-CM | POA: Diagnosis not present

## 2016-04-07 DIAGNOSIS — N2581 Secondary hyperparathyroidism of renal origin: Secondary | ICD-10-CM | POA: Diagnosis not present

## 2016-04-07 DIAGNOSIS — D631 Anemia in chronic kidney disease: Secondary | ICD-10-CM | POA: Diagnosis not present

## 2016-04-07 DIAGNOSIS — N186 End stage renal disease: Secondary | ICD-10-CM | POA: Diagnosis not present

## 2016-04-07 DIAGNOSIS — Z992 Dependence on renal dialysis: Secondary | ICD-10-CM | POA: Diagnosis not present

## 2016-04-07 DIAGNOSIS — D509 Iron deficiency anemia, unspecified: Secondary | ICD-10-CM | POA: Diagnosis not present

## 2016-04-09 DIAGNOSIS — N2581 Secondary hyperparathyroidism of renal origin: Secondary | ICD-10-CM | POA: Diagnosis not present

## 2016-04-09 DIAGNOSIS — D631 Anemia in chronic kidney disease: Secondary | ICD-10-CM | POA: Diagnosis not present

## 2016-04-09 DIAGNOSIS — Z992 Dependence on renal dialysis: Secondary | ICD-10-CM | POA: Diagnosis not present

## 2016-04-09 DIAGNOSIS — D509 Iron deficiency anemia, unspecified: Secondary | ICD-10-CM | POA: Diagnosis not present

## 2016-04-09 DIAGNOSIS — N186 End stage renal disease: Secondary | ICD-10-CM | POA: Diagnosis not present

## 2016-04-10 DIAGNOSIS — Z992 Dependence on renal dialysis: Secondary | ICD-10-CM | POA: Diagnosis not present

## 2016-04-10 DIAGNOSIS — E8779 Other fluid overload: Secondary | ICD-10-CM | POA: Diagnosis not present

## 2016-04-10 DIAGNOSIS — N186 End stage renal disease: Secondary | ICD-10-CM | POA: Diagnosis not present

## 2016-04-12 DIAGNOSIS — N2581 Secondary hyperparathyroidism of renal origin: Secondary | ICD-10-CM | POA: Diagnosis not present

## 2016-04-12 DIAGNOSIS — Z992 Dependence on renal dialysis: Secondary | ICD-10-CM | POA: Diagnosis not present

## 2016-04-12 DIAGNOSIS — D509 Iron deficiency anemia, unspecified: Secondary | ICD-10-CM | POA: Diagnosis not present

## 2016-04-12 DIAGNOSIS — D631 Anemia in chronic kidney disease: Secondary | ICD-10-CM | POA: Diagnosis not present

## 2016-04-12 DIAGNOSIS — N186 End stage renal disease: Secondary | ICD-10-CM | POA: Diagnosis not present

## 2016-04-14 DIAGNOSIS — N186 End stage renal disease: Secondary | ICD-10-CM | POA: Diagnosis not present

## 2016-04-14 DIAGNOSIS — Z992 Dependence on renal dialysis: Secondary | ICD-10-CM | POA: Diagnosis not present

## 2016-04-14 DIAGNOSIS — D631 Anemia in chronic kidney disease: Secondary | ICD-10-CM | POA: Diagnosis not present

## 2016-04-14 DIAGNOSIS — N2581 Secondary hyperparathyroidism of renal origin: Secondary | ICD-10-CM | POA: Diagnosis not present

## 2016-04-14 DIAGNOSIS — D509 Iron deficiency anemia, unspecified: Secondary | ICD-10-CM | POA: Diagnosis not present

## 2016-04-16 DIAGNOSIS — N2581 Secondary hyperparathyroidism of renal origin: Secondary | ICD-10-CM | POA: Diagnosis not present

## 2016-04-16 DIAGNOSIS — D631 Anemia in chronic kidney disease: Secondary | ICD-10-CM | POA: Diagnosis not present

## 2016-04-16 DIAGNOSIS — N186 End stage renal disease: Secondary | ICD-10-CM | POA: Diagnosis not present

## 2016-04-16 DIAGNOSIS — Z992 Dependence on renal dialysis: Secondary | ICD-10-CM | POA: Diagnosis not present

## 2016-04-16 DIAGNOSIS — D509 Iron deficiency anemia, unspecified: Secondary | ICD-10-CM | POA: Diagnosis not present

## 2016-04-19 DIAGNOSIS — N186 End stage renal disease: Secondary | ICD-10-CM | POA: Diagnosis not present

## 2016-04-19 DIAGNOSIS — Z992 Dependence on renal dialysis: Secondary | ICD-10-CM | POA: Diagnosis not present

## 2016-04-19 DIAGNOSIS — D509 Iron deficiency anemia, unspecified: Secondary | ICD-10-CM | POA: Diagnosis not present

## 2016-04-19 DIAGNOSIS — D631 Anemia in chronic kidney disease: Secondary | ICD-10-CM | POA: Diagnosis not present

## 2016-04-19 DIAGNOSIS — N2581 Secondary hyperparathyroidism of renal origin: Secondary | ICD-10-CM | POA: Diagnosis not present

## 2016-04-21 DIAGNOSIS — Z992 Dependence on renal dialysis: Secondary | ICD-10-CM | POA: Diagnosis not present

## 2016-04-21 DIAGNOSIS — D509 Iron deficiency anemia, unspecified: Secondary | ICD-10-CM | POA: Diagnosis not present

## 2016-04-21 DIAGNOSIS — N2581 Secondary hyperparathyroidism of renal origin: Secondary | ICD-10-CM | POA: Diagnosis not present

## 2016-04-21 DIAGNOSIS — N186 End stage renal disease: Secondary | ICD-10-CM | POA: Diagnosis not present

## 2016-04-21 DIAGNOSIS — D631 Anemia in chronic kidney disease: Secondary | ICD-10-CM | POA: Diagnosis not present

## 2016-04-23 DIAGNOSIS — N2581 Secondary hyperparathyroidism of renal origin: Secondary | ICD-10-CM | POA: Diagnosis not present

## 2016-04-23 DIAGNOSIS — D509 Iron deficiency anemia, unspecified: Secondary | ICD-10-CM | POA: Diagnosis not present

## 2016-04-23 DIAGNOSIS — N186 End stage renal disease: Secondary | ICD-10-CM | POA: Diagnosis not present

## 2016-04-23 DIAGNOSIS — Z992 Dependence on renal dialysis: Secondary | ICD-10-CM | POA: Diagnosis not present

## 2016-04-23 DIAGNOSIS — D631 Anemia in chronic kidney disease: Secondary | ICD-10-CM | POA: Diagnosis not present

## 2016-04-26 DIAGNOSIS — N2581 Secondary hyperparathyroidism of renal origin: Secondary | ICD-10-CM | POA: Diagnosis not present

## 2016-04-26 DIAGNOSIS — N186 End stage renal disease: Secondary | ICD-10-CM | POA: Diagnosis not present

## 2016-04-26 DIAGNOSIS — D631 Anemia in chronic kidney disease: Secondary | ICD-10-CM | POA: Diagnosis not present

## 2016-04-26 DIAGNOSIS — Z992 Dependence on renal dialysis: Secondary | ICD-10-CM | POA: Diagnosis not present

## 2016-04-26 DIAGNOSIS — D509 Iron deficiency anemia, unspecified: Secondary | ICD-10-CM | POA: Diagnosis not present

## 2016-04-28 DIAGNOSIS — N2581 Secondary hyperparathyroidism of renal origin: Secondary | ICD-10-CM | POA: Diagnosis not present

## 2016-04-28 DIAGNOSIS — D631 Anemia in chronic kidney disease: Secondary | ICD-10-CM | POA: Diagnosis not present

## 2016-04-28 DIAGNOSIS — N186 End stage renal disease: Secondary | ICD-10-CM | POA: Diagnosis not present

## 2016-04-28 DIAGNOSIS — D509 Iron deficiency anemia, unspecified: Secondary | ICD-10-CM | POA: Diagnosis not present

## 2016-04-28 DIAGNOSIS — Z992 Dependence on renal dialysis: Secondary | ICD-10-CM | POA: Diagnosis not present

## 2016-04-30 DIAGNOSIS — N186 End stage renal disease: Secondary | ICD-10-CM | POA: Diagnosis not present

## 2016-04-30 DIAGNOSIS — D509 Iron deficiency anemia, unspecified: Secondary | ICD-10-CM | POA: Diagnosis not present

## 2016-04-30 DIAGNOSIS — D631 Anemia in chronic kidney disease: Secondary | ICD-10-CM | POA: Diagnosis not present

## 2016-04-30 DIAGNOSIS — Z992 Dependence on renal dialysis: Secondary | ICD-10-CM | POA: Diagnosis not present

## 2016-04-30 DIAGNOSIS — N2581 Secondary hyperparathyroidism of renal origin: Secondary | ICD-10-CM | POA: Diagnosis not present

## 2016-05-03 DIAGNOSIS — N186 End stage renal disease: Secondary | ICD-10-CM | POA: Diagnosis not present

## 2016-05-03 DIAGNOSIS — N2581 Secondary hyperparathyroidism of renal origin: Secondary | ICD-10-CM | POA: Diagnosis not present

## 2016-05-03 DIAGNOSIS — D509 Iron deficiency anemia, unspecified: Secondary | ICD-10-CM | POA: Diagnosis not present

## 2016-05-03 DIAGNOSIS — Z992 Dependence on renal dialysis: Secondary | ICD-10-CM | POA: Diagnosis not present

## 2016-05-03 DIAGNOSIS — D631 Anemia in chronic kidney disease: Secondary | ICD-10-CM | POA: Diagnosis not present

## 2016-05-05 DIAGNOSIS — D509 Iron deficiency anemia, unspecified: Secondary | ICD-10-CM | POA: Diagnosis not present

## 2016-05-05 DIAGNOSIS — N186 End stage renal disease: Secondary | ICD-10-CM | POA: Diagnosis not present

## 2016-05-05 DIAGNOSIS — N2581 Secondary hyperparathyroidism of renal origin: Secondary | ICD-10-CM | POA: Diagnosis not present

## 2016-05-05 DIAGNOSIS — Z992 Dependence on renal dialysis: Secondary | ICD-10-CM | POA: Diagnosis not present

## 2016-05-05 DIAGNOSIS — D631 Anemia in chronic kidney disease: Secondary | ICD-10-CM | POA: Diagnosis not present

## 2016-05-07 DIAGNOSIS — Z992 Dependence on renal dialysis: Secondary | ICD-10-CM | POA: Diagnosis not present

## 2016-05-07 DIAGNOSIS — N186 End stage renal disease: Secondary | ICD-10-CM | POA: Diagnosis not present

## 2016-05-07 DIAGNOSIS — N2581 Secondary hyperparathyroidism of renal origin: Secondary | ICD-10-CM | POA: Diagnosis not present

## 2016-05-07 DIAGNOSIS — D509 Iron deficiency anemia, unspecified: Secondary | ICD-10-CM | POA: Diagnosis not present

## 2016-05-07 DIAGNOSIS — D631 Anemia in chronic kidney disease: Secondary | ICD-10-CM | POA: Diagnosis not present

## 2016-05-10 DIAGNOSIS — Z992 Dependence on renal dialysis: Secondary | ICD-10-CM | POA: Diagnosis not present

## 2016-05-10 DIAGNOSIS — D509 Iron deficiency anemia, unspecified: Secondary | ICD-10-CM | POA: Diagnosis not present

## 2016-05-10 DIAGNOSIS — D631 Anemia in chronic kidney disease: Secondary | ICD-10-CM | POA: Diagnosis not present

## 2016-05-10 DIAGNOSIS — N2581 Secondary hyperparathyroidism of renal origin: Secondary | ICD-10-CM | POA: Diagnosis not present

## 2016-05-10 DIAGNOSIS — N186 End stage renal disease: Secondary | ICD-10-CM | POA: Diagnosis not present

## 2016-05-12 DIAGNOSIS — N186 End stage renal disease: Secondary | ICD-10-CM | POA: Diagnosis not present

## 2016-05-12 DIAGNOSIS — D509 Iron deficiency anemia, unspecified: Secondary | ICD-10-CM | POA: Diagnosis not present

## 2016-05-12 DIAGNOSIS — Z992 Dependence on renal dialysis: Secondary | ICD-10-CM | POA: Diagnosis not present

## 2016-05-12 DIAGNOSIS — D631 Anemia in chronic kidney disease: Secondary | ICD-10-CM | POA: Diagnosis not present

## 2016-05-12 DIAGNOSIS — N2581 Secondary hyperparathyroidism of renal origin: Secondary | ICD-10-CM | POA: Diagnosis not present

## 2016-05-13 DIAGNOSIS — N186 End stage renal disease: Secondary | ICD-10-CM | POA: Diagnosis not present

## 2016-05-13 DIAGNOSIS — Z992 Dependence on renal dialysis: Secondary | ICD-10-CM | POA: Diagnosis not present

## 2016-05-14 DIAGNOSIS — N186 End stage renal disease: Secondary | ICD-10-CM | POA: Diagnosis not present

## 2016-05-14 DIAGNOSIS — Z992 Dependence on renal dialysis: Secondary | ICD-10-CM | POA: Diagnosis not present

## 2016-05-14 DIAGNOSIS — Z23 Encounter for immunization: Secondary | ICD-10-CM | POA: Diagnosis not present

## 2016-05-14 DIAGNOSIS — D631 Anemia in chronic kidney disease: Secondary | ICD-10-CM | POA: Diagnosis not present

## 2016-05-14 DIAGNOSIS — N2581 Secondary hyperparathyroidism of renal origin: Secondary | ICD-10-CM | POA: Diagnosis not present

## 2016-05-14 DIAGNOSIS — D509 Iron deficiency anemia, unspecified: Secondary | ICD-10-CM | POA: Diagnosis not present

## 2016-05-17 DIAGNOSIS — N186 End stage renal disease: Secondary | ICD-10-CM | POA: Diagnosis not present

## 2016-05-17 DIAGNOSIS — N2581 Secondary hyperparathyroidism of renal origin: Secondary | ICD-10-CM | POA: Diagnosis not present

## 2016-05-17 DIAGNOSIS — Z992 Dependence on renal dialysis: Secondary | ICD-10-CM | POA: Diagnosis not present

## 2016-05-17 DIAGNOSIS — D509 Iron deficiency anemia, unspecified: Secondary | ICD-10-CM | POA: Diagnosis not present

## 2016-05-17 DIAGNOSIS — D631 Anemia in chronic kidney disease: Secondary | ICD-10-CM | POA: Diagnosis not present

## 2016-05-17 DIAGNOSIS — Z23 Encounter for immunization: Secondary | ICD-10-CM | POA: Diagnosis not present

## 2016-05-19 DIAGNOSIS — N2581 Secondary hyperparathyroidism of renal origin: Secondary | ICD-10-CM | POA: Diagnosis not present

## 2016-05-19 DIAGNOSIS — N186 End stage renal disease: Secondary | ICD-10-CM | POA: Diagnosis not present

## 2016-05-19 DIAGNOSIS — D509 Iron deficiency anemia, unspecified: Secondary | ICD-10-CM | POA: Diagnosis not present

## 2016-05-19 DIAGNOSIS — Z992 Dependence on renal dialysis: Secondary | ICD-10-CM | POA: Diagnosis not present

## 2016-05-19 DIAGNOSIS — Z23 Encounter for immunization: Secondary | ICD-10-CM | POA: Diagnosis not present

## 2016-05-19 DIAGNOSIS — D631 Anemia in chronic kidney disease: Secondary | ICD-10-CM | POA: Diagnosis not present

## 2016-05-21 DIAGNOSIS — Z992 Dependence on renal dialysis: Secondary | ICD-10-CM | POA: Diagnosis not present

## 2016-05-21 DIAGNOSIS — D631 Anemia in chronic kidney disease: Secondary | ICD-10-CM | POA: Diagnosis not present

## 2016-05-21 DIAGNOSIS — N2581 Secondary hyperparathyroidism of renal origin: Secondary | ICD-10-CM | POA: Diagnosis not present

## 2016-05-21 DIAGNOSIS — D509 Iron deficiency anemia, unspecified: Secondary | ICD-10-CM | POA: Diagnosis not present

## 2016-05-21 DIAGNOSIS — N186 End stage renal disease: Secondary | ICD-10-CM | POA: Diagnosis not present

## 2016-05-21 DIAGNOSIS — Z23 Encounter for immunization: Secondary | ICD-10-CM | POA: Diagnosis not present

## 2016-05-24 DIAGNOSIS — N2581 Secondary hyperparathyroidism of renal origin: Secondary | ICD-10-CM | POA: Diagnosis not present

## 2016-05-24 DIAGNOSIS — N186 End stage renal disease: Secondary | ICD-10-CM | POA: Diagnosis not present

## 2016-05-24 DIAGNOSIS — Z992 Dependence on renal dialysis: Secondary | ICD-10-CM | POA: Diagnosis not present

## 2016-05-24 DIAGNOSIS — D631 Anemia in chronic kidney disease: Secondary | ICD-10-CM | POA: Diagnosis not present

## 2016-05-24 DIAGNOSIS — D509 Iron deficiency anemia, unspecified: Secondary | ICD-10-CM | POA: Diagnosis not present

## 2016-05-24 DIAGNOSIS — Z23 Encounter for immunization: Secondary | ICD-10-CM | POA: Diagnosis not present

## 2016-05-26 DIAGNOSIS — D631 Anemia in chronic kidney disease: Secondary | ICD-10-CM | POA: Diagnosis not present

## 2016-05-26 DIAGNOSIS — Z992 Dependence on renal dialysis: Secondary | ICD-10-CM | POA: Diagnosis not present

## 2016-05-26 DIAGNOSIS — D509 Iron deficiency anemia, unspecified: Secondary | ICD-10-CM | POA: Diagnosis not present

## 2016-05-26 DIAGNOSIS — Z23 Encounter for immunization: Secondary | ICD-10-CM | POA: Diagnosis not present

## 2016-05-26 DIAGNOSIS — N2581 Secondary hyperparathyroidism of renal origin: Secondary | ICD-10-CM | POA: Diagnosis not present

## 2016-05-26 DIAGNOSIS — N186 End stage renal disease: Secondary | ICD-10-CM | POA: Diagnosis not present

## 2016-05-28 DIAGNOSIS — N186 End stage renal disease: Secondary | ICD-10-CM | POA: Diagnosis not present

## 2016-05-28 DIAGNOSIS — N2581 Secondary hyperparathyroidism of renal origin: Secondary | ICD-10-CM | POA: Diagnosis not present

## 2016-05-28 DIAGNOSIS — D509 Iron deficiency anemia, unspecified: Secondary | ICD-10-CM | POA: Diagnosis not present

## 2016-05-28 DIAGNOSIS — D631 Anemia in chronic kidney disease: Secondary | ICD-10-CM | POA: Diagnosis not present

## 2016-05-28 DIAGNOSIS — Z23 Encounter for immunization: Secondary | ICD-10-CM | POA: Diagnosis not present

## 2016-05-28 DIAGNOSIS — Z992 Dependence on renal dialysis: Secondary | ICD-10-CM | POA: Diagnosis not present

## 2016-05-31 DIAGNOSIS — D509 Iron deficiency anemia, unspecified: Secondary | ICD-10-CM | POA: Diagnosis not present

## 2016-05-31 DIAGNOSIS — Z992 Dependence on renal dialysis: Secondary | ICD-10-CM | POA: Diagnosis not present

## 2016-05-31 DIAGNOSIS — Z23 Encounter for immunization: Secondary | ICD-10-CM | POA: Diagnosis not present

## 2016-05-31 DIAGNOSIS — N186 End stage renal disease: Secondary | ICD-10-CM | POA: Diagnosis not present

## 2016-05-31 DIAGNOSIS — N2581 Secondary hyperparathyroidism of renal origin: Secondary | ICD-10-CM | POA: Diagnosis not present

## 2016-05-31 DIAGNOSIS — D631 Anemia in chronic kidney disease: Secondary | ICD-10-CM | POA: Diagnosis not present

## 2016-06-02 DIAGNOSIS — N186 End stage renal disease: Secondary | ICD-10-CM | POA: Diagnosis not present

## 2016-06-02 DIAGNOSIS — Z992 Dependence on renal dialysis: Secondary | ICD-10-CM | POA: Diagnosis not present

## 2016-06-02 DIAGNOSIS — N2581 Secondary hyperparathyroidism of renal origin: Secondary | ICD-10-CM | POA: Diagnosis not present

## 2016-06-02 DIAGNOSIS — Z23 Encounter for immunization: Secondary | ICD-10-CM | POA: Diagnosis not present

## 2016-06-02 DIAGNOSIS — D631 Anemia in chronic kidney disease: Secondary | ICD-10-CM | POA: Diagnosis not present

## 2016-06-02 DIAGNOSIS — D509 Iron deficiency anemia, unspecified: Secondary | ICD-10-CM | POA: Diagnosis not present

## 2016-06-04 DIAGNOSIS — N2581 Secondary hyperparathyroidism of renal origin: Secondary | ICD-10-CM | POA: Diagnosis not present

## 2016-06-04 DIAGNOSIS — N186 End stage renal disease: Secondary | ICD-10-CM | POA: Diagnosis not present

## 2016-06-04 DIAGNOSIS — D509 Iron deficiency anemia, unspecified: Secondary | ICD-10-CM | POA: Diagnosis not present

## 2016-06-04 DIAGNOSIS — D631 Anemia in chronic kidney disease: Secondary | ICD-10-CM | POA: Diagnosis not present

## 2016-06-04 DIAGNOSIS — Z23 Encounter for immunization: Secondary | ICD-10-CM | POA: Diagnosis not present

## 2016-06-04 DIAGNOSIS — Z992 Dependence on renal dialysis: Secondary | ICD-10-CM | POA: Diagnosis not present

## 2016-06-07 DIAGNOSIS — N186 End stage renal disease: Secondary | ICD-10-CM | POA: Diagnosis not present

## 2016-06-07 DIAGNOSIS — N2581 Secondary hyperparathyroidism of renal origin: Secondary | ICD-10-CM | POA: Diagnosis not present

## 2016-06-07 DIAGNOSIS — Z992 Dependence on renal dialysis: Secondary | ICD-10-CM | POA: Diagnosis not present

## 2016-06-07 DIAGNOSIS — D509 Iron deficiency anemia, unspecified: Secondary | ICD-10-CM | POA: Diagnosis not present

## 2016-06-07 DIAGNOSIS — D631 Anemia in chronic kidney disease: Secondary | ICD-10-CM | POA: Diagnosis not present

## 2016-06-07 DIAGNOSIS — Z23 Encounter for immunization: Secondary | ICD-10-CM | POA: Diagnosis not present

## 2016-06-09 DIAGNOSIS — D509 Iron deficiency anemia, unspecified: Secondary | ICD-10-CM | POA: Diagnosis not present

## 2016-06-09 DIAGNOSIS — Z992 Dependence on renal dialysis: Secondary | ICD-10-CM | POA: Diagnosis not present

## 2016-06-09 DIAGNOSIS — Z23 Encounter for immunization: Secondary | ICD-10-CM | POA: Diagnosis not present

## 2016-06-09 DIAGNOSIS — N186 End stage renal disease: Secondary | ICD-10-CM | POA: Diagnosis not present

## 2016-06-09 DIAGNOSIS — N2581 Secondary hyperparathyroidism of renal origin: Secondary | ICD-10-CM | POA: Diagnosis not present

## 2016-06-09 DIAGNOSIS — D631 Anemia in chronic kidney disease: Secondary | ICD-10-CM | POA: Diagnosis not present

## 2016-06-11 DIAGNOSIS — Z992 Dependence on renal dialysis: Secondary | ICD-10-CM | POA: Diagnosis not present

## 2016-06-11 DIAGNOSIS — Z23 Encounter for immunization: Secondary | ICD-10-CM | POA: Diagnosis not present

## 2016-06-11 DIAGNOSIS — D509 Iron deficiency anemia, unspecified: Secondary | ICD-10-CM | POA: Diagnosis not present

## 2016-06-11 DIAGNOSIS — D631 Anemia in chronic kidney disease: Secondary | ICD-10-CM | POA: Diagnosis not present

## 2016-06-11 DIAGNOSIS — N186 End stage renal disease: Secondary | ICD-10-CM | POA: Diagnosis not present

## 2016-06-11 DIAGNOSIS — N2581 Secondary hyperparathyroidism of renal origin: Secondary | ICD-10-CM | POA: Diagnosis not present

## 2016-06-12 DIAGNOSIS — N186 End stage renal disease: Secondary | ICD-10-CM | POA: Diagnosis not present

## 2016-06-12 DIAGNOSIS — Z992 Dependence on renal dialysis: Secondary | ICD-10-CM | POA: Diagnosis not present

## 2016-06-14 DIAGNOSIS — E8770 Fluid overload, unspecified: Secondary | ICD-10-CM | POA: Diagnosis not present

## 2016-06-14 DIAGNOSIS — N186 End stage renal disease: Secondary | ICD-10-CM | POA: Diagnosis not present

## 2016-06-14 DIAGNOSIS — N2581 Secondary hyperparathyroidism of renal origin: Secondary | ICD-10-CM | POA: Diagnosis not present

## 2016-06-14 DIAGNOSIS — Z992 Dependence on renal dialysis: Secondary | ICD-10-CM | POA: Diagnosis not present

## 2016-06-16 DIAGNOSIS — N2581 Secondary hyperparathyroidism of renal origin: Secondary | ICD-10-CM | POA: Diagnosis not present

## 2016-06-16 DIAGNOSIS — Z992 Dependence on renal dialysis: Secondary | ICD-10-CM | POA: Diagnosis not present

## 2016-06-16 DIAGNOSIS — N186 End stage renal disease: Secondary | ICD-10-CM | POA: Diagnosis not present

## 2016-06-16 DIAGNOSIS — E8770 Fluid overload, unspecified: Secondary | ICD-10-CM | POA: Diagnosis not present

## 2016-06-17 ENCOUNTER — Other Ambulatory Visit: Payer: Self-pay | Admitting: Internal Medicine

## 2016-06-17 ENCOUNTER — Ambulatory Visit
Admission: RE | Admit: 2016-06-17 | Discharge: 2016-06-17 | Disposition: A | Discharge status: Home / Self Care | Payer: Medicare Other | Source: Ambulatory Visit | Attending: Internal Medicine | Admitting: Internal Medicine

## 2016-06-17 DIAGNOSIS — R059 Cough, unspecified: Secondary | ICD-10-CM

## 2016-06-17 DIAGNOSIS — R05 Cough: Secondary | ICD-10-CM

## 2016-06-17 DIAGNOSIS — F17200 Nicotine dependence, unspecified, uncomplicated: Secondary | ICD-10-CM

## 2016-06-17 DIAGNOSIS — F17210 Nicotine dependence, cigarettes, uncomplicated: Secondary | ICD-10-CM | POA: Diagnosis not present

## 2016-06-17 DIAGNOSIS — Z951 Presence of aortocoronary bypass graft: Secondary | ICD-10-CM | POA: Insufficient documentation

## 2016-06-18 DIAGNOSIS — N2581 Secondary hyperparathyroidism of renal origin: Secondary | ICD-10-CM | POA: Diagnosis not present

## 2016-06-18 DIAGNOSIS — Z992 Dependence on renal dialysis: Secondary | ICD-10-CM | POA: Diagnosis not present

## 2016-06-18 DIAGNOSIS — N186 End stage renal disease: Secondary | ICD-10-CM | POA: Diagnosis not present

## 2016-06-18 DIAGNOSIS — E8770 Fluid overload, unspecified: Secondary | ICD-10-CM | POA: Diagnosis not present

## 2016-06-21 DIAGNOSIS — E8770 Fluid overload, unspecified: Secondary | ICD-10-CM | POA: Diagnosis not present

## 2016-06-21 DIAGNOSIS — Z992 Dependence on renal dialysis: Secondary | ICD-10-CM | POA: Diagnosis not present

## 2016-06-21 DIAGNOSIS — N2581 Secondary hyperparathyroidism of renal origin: Secondary | ICD-10-CM | POA: Diagnosis not present

## 2016-06-21 DIAGNOSIS — N186 End stage renal disease: Secondary | ICD-10-CM | POA: Diagnosis not present

## 2016-06-23 DIAGNOSIS — E8770 Fluid overload, unspecified: Secondary | ICD-10-CM | POA: Diagnosis not present

## 2016-06-23 DIAGNOSIS — Z992 Dependence on renal dialysis: Secondary | ICD-10-CM | POA: Diagnosis not present

## 2016-06-23 DIAGNOSIS — N2581 Secondary hyperparathyroidism of renal origin: Secondary | ICD-10-CM | POA: Diagnosis not present

## 2016-06-23 DIAGNOSIS — N186 End stage renal disease: Secondary | ICD-10-CM | POA: Diagnosis not present

## 2016-06-25 ENCOUNTER — Ambulatory Visit: Payer: Medicare Other | Admitting: Cardiovascular Disease

## 2016-06-25 DIAGNOSIS — N186 End stage renal disease: Secondary | ICD-10-CM | POA: Diagnosis not present

## 2016-06-25 DIAGNOSIS — Z992 Dependence on renal dialysis: Secondary | ICD-10-CM | POA: Diagnosis not present

## 2016-06-25 DIAGNOSIS — E8770 Fluid overload, unspecified: Secondary | ICD-10-CM | POA: Diagnosis not present

## 2016-06-25 DIAGNOSIS — N2581 Secondary hyperparathyroidism of renal origin: Secondary | ICD-10-CM | POA: Diagnosis not present

## 2016-06-28 DIAGNOSIS — N186 End stage renal disease: Secondary | ICD-10-CM | POA: Diagnosis not present

## 2016-06-28 DIAGNOSIS — Z992 Dependence on renal dialysis: Secondary | ICD-10-CM | POA: Diagnosis not present

## 2016-06-28 DIAGNOSIS — E8770 Fluid overload, unspecified: Secondary | ICD-10-CM | POA: Diagnosis not present

## 2016-06-28 DIAGNOSIS — N2581 Secondary hyperparathyroidism of renal origin: Secondary | ICD-10-CM | POA: Diagnosis not present

## 2016-06-29 ENCOUNTER — Encounter: Payer: Self-pay | Admitting: Cardiovascular Disease

## 2016-06-29 ENCOUNTER — Ambulatory Visit (INDEPENDENT_AMBULATORY_CARE_PROVIDER_SITE_OTHER): Payer: Medicare Other | Admitting: Cardiovascular Disease

## 2016-06-29 VITALS — BP 180/100 | HR 73 | Ht 69.0 in | Wt 168.4 lb

## 2016-06-29 DIAGNOSIS — I2510 Atherosclerotic heart disease of native coronary artery without angina pectoris: Secondary | ICD-10-CM

## 2016-06-29 DIAGNOSIS — I5032 Chronic diastolic (congestive) heart failure: Secondary | ICD-10-CM

## 2016-06-29 DIAGNOSIS — I10 Essential (primary) hypertension: Secondary | ICD-10-CM | POA: Diagnosis not present

## 2016-06-29 DIAGNOSIS — E785 Hyperlipidemia, unspecified: Secondary | ICD-10-CM | POA: Diagnosis not present

## 2016-06-29 MED ORDER — AMLODIPINE BESYLATE 10 MG PO TABS: ORAL_TABLET | ORAL | 5 refills | Status: DC

## 2016-06-29 NOTE — Patient Instructions (Signed)
Medication Instructions:  Your physician has recommended you make the following change in your medication:  INCREASE amlodipine to 10mg  once daily   Labwork: none  Testing/Procedures: none  Follow-Up: Your physician wants you to follow-up in: six months with Dr. Fletcher Anon. You will receive a reminder letter in the mail two months in advance. If you don't receive a letter, please call our office to schedule the follow-up appointment.   Any Other Special Instructions Will Be Listed Below (If Applicable).     If you need a refill on your cardiac medications before your next appointment, please call your pharmacy.

## 2016-06-29 NOTE — Progress Notes (Signed)
Cardiology Office Note   Date:  06/29/2016   ID:  Daryl Weeks, DOB 1961-01-25, MRN [id number]  PCP:  Pcp Not In System  Cardiologist:   Kathlyn Sacramento, MD   Chief Complaint  Patient presents with  . Chronic diastolic heart failure ([place])  . Essential hypertension  . Coronary artery disease involving native coronary artery of       History of Present Illness: Daryl Weeks is a 55 y.o. male who presents for a followup visit regarding coronary artery disease.  He is status post CABG in March 2015 after he presented with non-ST elevation myocardial infarction in the setting of severe anemia due to GI bleed.  He has known history of end-stage renal disease currently on dialysis and he is on the transplant list at [facility] Other medical conditions include chronic diastolic heart failure, hypertension, hyperlipidemia and previous tobacco and alcohol use.  Cardiac workup was done in March 2016 in preparation for kidney transplant. Nuclear stress test was normal. Echocardiogram showed normal LV systolic function with mildly dilated left atrium. He has been doing well from a cardiac standpoint with no chest pain or shortness of breath. He still waiting for kidney transplant. His blood pressure is running high but does have history of hypotension during dialysis. Unfortunately, he continues to smoke half a pack per day.    Past Medical History:  Diagnosis Date  . Anemia   . Arthritis   . CHF (congestive heart failure) ([place])   . Colon polyps   . COPD (chronic obstructive pulmonary disease) ([place])   . Coronary artery disease    Non-ST elevation myocardial infarction in February of 2015. In the setting of hypertensive urgency and blood loss anemia. Cardiac catheterization showed significant three-vessel coronary artery disease. EF was 55% by echo. He underwent CABG at [facility] on May 19 with LIMA to LAD, Sequential SVG to OM1 and OM2, and SVG to RCA  . End stage renal disease ([place])    m-w-f    -davida [place]  . ETOH abuse   . GERD (gastroesophageal reflux disease)    hx  . Hyperlipidemia   . Hypertension   . IgA nephropathy   . Lower GI bleed    Due to colon polyps. Status post resection of 14 polyps  . Non-ST elevation MI (NSTEMI) ([place])   . Pneumonia    hx  . Shortness of breath   . Tobacco abuse   . Tobacco abuse     Past Surgical History:  Procedure Laterality Date  . AV FISTULA PLACEMENT    . CARDIAC CATHETERIZATION     RCA 90% and calcified mid LAD 80% Stenosis  . CORONARY ARTERY BYPASS GRAFT N/A 11/29/2013   Procedure: CORONARY ARTERY BYPASS GRAFTING (CABG) x 4 using endoscopically harvested right saphenous vein and left internal mammary artery;  Surgeon: [name], MD;  Location: [place] OR;  Service: Open Heart Surgery;  Laterality: N/A;  . dialysis catheterr  2/15  . INTRAOPERATIVE TRANSESOPHAGEAL ECHOCARDIOGRAM N/A 11/29/2013   Procedure: INTRAOPERATIVE TRANSESOPHAGEAL ECHOCARDIOGRAM;  Surgeon: [name], MD;  Location: [facility] OR;  Service: Open Heart Surgery;  Laterality: N/A;  . PERIPHERAL VASCULAR CATHETERIZATION N/A 06/12/2015   Procedure: A/V Shuntogram/Fistulagram;  Surgeon: [name], MD;  Location: [place] [facility];  Service: Cardiovascular;  Laterality: N/A;  . PERIPHERAL VASCULAR CATHETERIZATION Left 06/12/2015   Procedure: A/V Shunt Intervention;  Surgeon: [name], MD;  Location: [place] [facility];  Service: Cardiovascular;  Laterality: Left;  . RENAL BIOPSY Left 14     Current Outpatient Prescriptions  Medication Sig Dispense Refill  . amLODipine (NORVASC) 10 MG tablet take 1 tablet by mouth once daily 30 tablet 5  . aspirin EC 81 MG tablet Take 1 tablet (81 mg total) by mouth daily. 90 tablet 3  . atorvastatin (LIPITOR) 20 MG tablet take 1 tablet by mouth at bedtime 30 tablet 6  . cinacalcet (SENSIPAR) 30 MG tablet Take 30 mg by mouth daily.    . furosemide (LASIX) 80 MG tablet Take 80 mg by mouth daily.    . metoprolol  (LOPRESSOR) 50 MG tablet take 1 tablet by mouth twice a day 60 tablet 6  . traMADol (ULTRAM) 50 MG tablet Take 1 tablet (50 mg total) by mouth every 6 (six) hours as needed. 40 tablet 0  . zolpidem (AMBIEN) 5 MG tablet Take 1 tablet (5 mg total) by mouth at bedtime as needed for sleep. 30 tablet 0   No current facility-administered medications for this visit.     Allergies:   Review of patient's allergies indicates no known allergies.    Social History:  The patient  reports that he has been smoking Cigarettes.  He has a 15.00 pack-year smoking history. He has never used smokeless tobacco. He reports that he drinks about 1.2 oz of alcohol per week . He reports that he does not use drugs.   Family History:  The patient's family history includes Heart disease in his brother and father.    ROS:  Please see the history of present illness.   Otherwise, review of systems are positive for none.   All other systems are reviewed and negative.    PHYSICAL EXAM: VS:  BP (!) 180/100   Pulse 73   Ht 5\' 9"  (1.753 m)   Wt 168 lb 6.4 oz (76.4 kg)   SpO2 96%   BMI 24.87 kg/m  , BMI Body mass index is 24.87 kg/m. GEN: Well nourished, well developed, in no acute distress  HEENT: normal  Neck: no JVD, carotid bruits, or masses Cardiac: RRR; no murmurs, rubs, or gallops,no edema  Respiratory:  clear to auscultation bilaterally, normal work of breathing GI: soft, nontender, nondistended, + BS MS: no deformity or atrophy  Skin: warm and dry, no rash Neuro:  Strength and sensation are intact Psych: euthymic mood, full affect   EKG:  EKG is ordered today. The ekg ordered today demonstrates Normal sinus rhythm with PVCs. LVH with repolarization abnormalities   Recent Labs: No results found for requested labs within last 8760 hours.    Lipid Panel    Component Value Date/Time   CHOL 143 10/30/2013 0543   TRIG 62 10/30/2013 0543   HDL 65 (H) 10/30/2013 0543   VLDL 12 10/30/2013 0543    LDLCALC 66 10/30/2013 0543      Wt Readings from Last 3 Encounters:  06/29/16 168 lb 6.4 oz (76.4 kg)  09/04/15 163 lb (73.9 kg)  06/12/15 160 lb (72.6 kg)       No flowsheet data found.    ASSESSMENT AND PLAN:  1.  Coronary artery disease involving native coronary arteries without angina:  He is doing reasonably well with no anginal symptoms. Continue medical therapy.  2. Chronic diastolic heart failure: He appears to be euvolemic.   3. Hyperlipidemia:Continue treatment with atorvastatin with a target LDL of less than 70.   4. Essential hypertension:  Blood pressure is elevated but he has  history of hypotension during dialysis. I increased amlodipine to 10 mg daily. If blood pressure continues to be elevated, we can consider adding Hydralazine.    Disposition:   FU with me in 6 months  Signed,  Kathlyn Sacramento, MD  06/29/2016 2:27 PM    [place]

## 2016-06-30 DIAGNOSIS — N186 End stage renal disease: Secondary | ICD-10-CM | POA: Diagnosis not present

## 2016-06-30 DIAGNOSIS — E8770 Fluid overload, unspecified: Secondary | ICD-10-CM | POA: Diagnosis not present

## 2016-06-30 DIAGNOSIS — N2581 Secondary hyperparathyroidism of renal origin: Secondary | ICD-10-CM | POA: Diagnosis not present

## 2016-06-30 DIAGNOSIS — Z992 Dependence on renal dialysis: Secondary | ICD-10-CM | POA: Diagnosis not present

## 2016-07-01 ENCOUNTER — Ambulatory Visit: Payer: Medicare Other | Admitting: Cardiovascular Disease

## 2016-07-02 DIAGNOSIS — N186 End stage renal disease: Secondary | ICD-10-CM | POA: Diagnosis not present

## 2016-07-02 DIAGNOSIS — E8770 Fluid overload, unspecified: Secondary | ICD-10-CM | POA: Diagnosis not present

## 2016-07-02 DIAGNOSIS — Z992 Dependence on renal dialysis: Secondary | ICD-10-CM | POA: Diagnosis not present

## 2016-07-02 DIAGNOSIS — N2581 Secondary hyperparathyroidism of renal origin: Secondary | ICD-10-CM | POA: Diagnosis not present

## 2016-07-05 DIAGNOSIS — Z992 Dependence on renal dialysis: Secondary | ICD-10-CM | POA: Diagnosis not present

## 2016-07-05 DIAGNOSIS — N186 End stage renal disease: Secondary | ICD-10-CM | POA: Diagnosis not present

## 2016-07-05 DIAGNOSIS — N2581 Secondary hyperparathyroidism of renal origin: Secondary | ICD-10-CM | POA: Diagnosis not present

## 2016-07-05 DIAGNOSIS — E8770 Fluid overload, unspecified: Secondary | ICD-10-CM | POA: Diagnosis not present

## 2016-07-07 DIAGNOSIS — N2581 Secondary hyperparathyroidism of renal origin: Secondary | ICD-10-CM | POA: Diagnosis not present

## 2016-07-07 DIAGNOSIS — N186 End stage renal disease: Secondary | ICD-10-CM | POA: Diagnosis not present

## 2016-07-07 DIAGNOSIS — E8770 Fluid overload, unspecified: Secondary | ICD-10-CM | POA: Diagnosis not present

## 2016-07-07 DIAGNOSIS — Z992 Dependence on renal dialysis: Secondary | ICD-10-CM | POA: Diagnosis not present

## 2016-07-09 DIAGNOSIS — E8770 Fluid overload, unspecified: Secondary | ICD-10-CM | POA: Diagnosis not present

## 2016-07-09 DIAGNOSIS — Z992 Dependence on renal dialysis: Secondary | ICD-10-CM | POA: Diagnosis not present

## 2016-07-09 DIAGNOSIS — N2581 Secondary hyperparathyroidism of renal origin: Secondary | ICD-10-CM | POA: Diagnosis not present

## 2016-07-09 DIAGNOSIS — N186 End stage renal disease: Secondary | ICD-10-CM | POA: Diagnosis not present

## 2016-07-12 DIAGNOSIS — Z992 Dependence on renal dialysis: Secondary | ICD-10-CM | POA: Diagnosis not present

## 2016-07-12 DIAGNOSIS — N186 End stage renal disease: Secondary | ICD-10-CM | POA: Diagnosis not present

## 2016-07-12 DIAGNOSIS — N2581 Secondary hyperparathyroidism of renal origin: Secondary | ICD-10-CM | POA: Diagnosis not present

## 2016-07-12 DIAGNOSIS — E8770 Fluid overload, unspecified: Secondary | ICD-10-CM | POA: Diagnosis not present

## 2016-07-12 NOTE — Addendum Note (Signed)
Addended by: Roberts Gaudy on: 07/12/2016 11:30 AM   Modules accepted: Orders

## 2016-07-13 DIAGNOSIS — Z992 Dependence on renal dialysis: Secondary | ICD-10-CM | POA: Diagnosis not present

## 2016-07-13 DIAGNOSIS — N186 End stage renal disease: Secondary | ICD-10-CM | POA: Diagnosis not present

## 2016-07-13 DIAGNOSIS — N2581 Secondary hyperparathyroidism of renal origin: Secondary | ICD-10-CM | POA: Diagnosis not present

## 2016-07-13 DIAGNOSIS — E8770 Fluid overload, unspecified: Secondary | ICD-10-CM | POA: Diagnosis not present

## 2016-07-14 DIAGNOSIS — N2581 Secondary hyperparathyroidism of renal origin: Secondary | ICD-10-CM | POA: Diagnosis not present

## 2016-07-14 DIAGNOSIS — D509 Iron deficiency anemia, unspecified: Secondary | ICD-10-CM | POA: Diagnosis not present

## 2016-07-14 DIAGNOSIS — Z992 Dependence on renal dialysis: Secondary | ICD-10-CM | POA: Diagnosis not present

## 2016-07-14 DIAGNOSIS — D631 Anemia in chronic kidney disease: Secondary | ICD-10-CM | POA: Diagnosis not present

## 2016-07-14 DIAGNOSIS — N186 End stage renal disease: Secondary | ICD-10-CM | POA: Diagnosis not present

## 2016-07-16 DIAGNOSIS — N2581 Secondary hyperparathyroidism of renal origin: Secondary | ICD-10-CM | POA: Diagnosis not present

## 2016-07-16 DIAGNOSIS — D631 Anemia in chronic kidney disease: Secondary | ICD-10-CM | POA: Diagnosis not present

## 2016-07-16 DIAGNOSIS — N186 End stage renal disease: Secondary | ICD-10-CM | POA: Diagnosis not present

## 2016-07-16 DIAGNOSIS — Z992 Dependence on renal dialysis: Secondary | ICD-10-CM | POA: Diagnosis not present

## 2016-07-16 DIAGNOSIS — D509 Iron deficiency anemia, unspecified: Secondary | ICD-10-CM | POA: Diagnosis not present

## 2016-07-19 DIAGNOSIS — N186 End stage renal disease: Secondary | ICD-10-CM | POA: Diagnosis not present

## 2016-07-19 DIAGNOSIS — D509 Iron deficiency anemia, unspecified: Secondary | ICD-10-CM | POA: Diagnosis not present

## 2016-07-19 DIAGNOSIS — N2581 Secondary hyperparathyroidism of renal origin: Secondary | ICD-10-CM | POA: Diagnosis not present

## 2016-07-19 DIAGNOSIS — D631 Anemia in chronic kidney disease: Secondary | ICD-10-CM | POA: Diagnosis not present

## 2016-07-19 DIAGNOSIS — Z992 Dependence on renal dialysis: Secondary | ICD-10-CM | POA: Diagnosis not present

## 2016-07-21 DIAGNOSIS — N2581 Secondary hyperparathyroidism of renal origin: Secondary | ICD-10-CM | POA: Diagnosis not present

## 2016-07-21 DIAGNOSIS — D631 Anemia in chronic kidney disease: Secondary | ICD-10-CM | POA: Diagnosis not present

## 2016-07-21 DIAGNOSIS — D509 Iron deficiency anemia, unspecified: Secondary | ICD-10-CM | POA: Diagnosis not present

## 2016-07-21 DIAGNOSIS — N186 End stage renal disease: Secondary | ICD-10-CM | POA: Diagnosis not present

## 2016-07-21 DIAGNOSIS — Z992 Dependence on renal dialysis: Secondary | ICD-10-CM | POA: Diagnosis not present

## 2016-07-23 DIAGNOSIS — Z992 Dependence on renal dialysis: Secondary | ICD-10-CM | POA: Diagnosis not present

## 2016-07-23 DIAGNOSIS — N2581 Secondary hyperparathyroidism of renal origin: Secondary | ICD-10-CM | POA: Diagnosis not present

## 2016-07-23 DIAGNOSIS — D631 Anemia in chronic kidney disease: Secondary | ICD-10-CM | POA: Diagnosis not present

## 2016-07-23 DIAGNOSIS — N186 End stage renal disease: Secondary | ICD-10-CM | POA: Diagnosis not present

## 2016-07-23 DIAGNOSIS — D509 Iron deficiency anemia, unspecified: Secondary | ICD-10-CM | POA: Diagnosis not present

## 2016-07-26 DIAGNOSIS — N186 End stage renal disease: Secondary | ICD-10-CM | POA: Diagnosis not present

## 2016-07-26 DIAGNOSIS — D631 Anemia in chronic kidney disease: Secondary | ICD-10-CM | POA: Diagnosis not present

## 2016-07-26 DIAGNOSIS — Z992 Dependence on renal dialysis: Secondary | ICD-10-CM | POA: Diagnosis not present

## 2016-07-26 DIAGNOSIS — N2581 Secondary hyperparathyroidism of renal origin: Secondary | ICD-10-CM | POA: Diagnosis not present

## 2016-07-26 DIAGNOSIS — D509 Iron deficiency anemia, unspecified: Secondary | ICD-10-CM | POA: Diagnosis not present

## 2016-07-28 DIAGNOSIS — N186 End stage renal disease: Secondary | ICD-10-CM | POA: Diagnosis not present

## 2016-07-28 DIAGNOSIS — N2581 Secondary hyperparathyroidism of renal origin: Secondary | ICD-10-CM | POA: Diagnosis not present

## 2016-07-28 DIAGNOSIS — D631 Anemia in chronic kidney disease: Secondary | ICD-10-CM | POA: Diagnosis not present

## 2016-07-28 DIAGNOSIS — Z992 Dependence on renal dialysis: Secondary | ICD-10-CM | POA: Diagnosis not present

## 2016-07-28 DIAGNOSIS — D509 Iron deficiency anemia, unspecified: Secondary | ICD-10-CM | POA: Diagnosis not present

## 2016-07-30 DIAGNOSIS — N186 End stage renal disease: Secondary | ICD-10-CM | POA: Diagnosis not present

## 2016-07-30 DIAGNOSIS — D509 Iron deficiency anemia, unspecified: Secondary | ICD-10-CM | POA: Diagnosis not present

## 2016-07-30 DIAGNOSIS — Z992 Dependence on renal dialysis: Secondary | ICD-10-CM | POA: Diagnosis not present

## 2016-07-30 DIAGNOSIS — N2581 Secondary hyperparathyroidism of renal origin: Secondary | ICD-10-CM | POA: Diagnosis not present

## 2016-07-30 DIAGNOSIS — D631 Anemia in chronic kidney disease: Secondary | ICD-10-CM | POA: Diagnosis not present

## 2016-08-02 DIAGNOSIS — N186 End stage renal disease: Secondary | ICD-10-CM | POA: Diagnosis not present

## 2016-08-02 DIAGNOSIS — D631 Anemia in chronic kidney disease: Secondary | ICD-10-CM | POA: Diagnosis not present

## 2016-08-02 DIAGNOSIS — D509 Iron deficiency anemia, unspecified: Secondary | ICD-10-CM | POA: Diagnosis not present

## 2016-08-02 DIAGNOSIS — N2581 Secondary hyperparathyroidism of renal origin: Secondary | ICD-10-CM | POA: Diagnosis not present

## 2016-08-02 DIAGNOSIS — Z992 Dependence on renal dialysis: Secondary | ICD-10-CM | POA: Diagnosis not present

## 2016-08-04 DIAGNOSIS — D631 Anemia in chronic kidney disease: Secondary | ICD-10-CM | POA: Diagnosis not present

## 2016-08-04 DIAGNOSIS — D509 Iron deficiency anemia, unspecified: Secondary | ICD-10-CM | POA: Diagnosis not present

## 2016-08-04 DIAGNOSIS — Z992 Dependence on renal dialysis: Secondary | ICD-10-CM | POA: Diagnosis not present

## 2016-08-04 DIAGNOSIS — N186 End stage renal disease: Secondary | ICD-10-CM | POA: Diagnosis not present

## 2016-08-04 DIAGNOSIS — N2581 Secondary hyperparathyroidism of renal origin: Secondary | ICD-10-CM | POA: Diagnosis not present

## 2016-08-06 DIAGNOSIS — D631 Anemia in chronic kidney disease: Secondary | ICD-10-CM | POA: Diagnosis not present

## 2016-08-06 DIAGNOSIS — N186 End stage renal disease: Secondary | ICD-10-CM | POA: Diagnosis not present

## 2016-08-06 DIAGNOSIS — D509 Iron deficiency anemia, unspecified: Secondary | ICD-10-CM | POA: Diagnosis not present

## 2016-08-06 DIAGNOSIS — Z992 Dependence on renal dialysis: Secondary | ICD-10-CM | POA: Diagnosis not present

## 2016-08-06 DIAGNOSIS — N2581 Secondary hyperparathyroidism of renal origin: Secondary | ICD-10-CM | POA: Diagnosis not present

## 2016-08-09 DIAGNOSIS — Z992 Dependence on renal dialysis: Secondary | ICD-10-CM | POA: Diagnosis not present

## 2016-08-09 DIAGNOSIS — D631 Anemia in chronic kidney disease: Secondary | ICD-10-CM | POA: Diagnosis not present

## 2016-08-09 DIAGNOSIS — N2581 Secondary hyperparathyroidism of renal origin: Secondary | ICD-10-CM | POA: Diagnosis not present

## 2016-08-09 DIAGNOSIS — D509 Iron deficiency anemia, unspecified: Secondary | ICD-10-CM | POA: Diagnosis not present

## 2016-08-09 DIAGNOSIS — N186 End stage renal disease: Secondary | ICD-10-CM | POA: Diagnosis not present

## 2016-08-11 DIAGNOSIS — D631 Anemia in chronic kidney disease: Secondary | ICD-10-CM | POA: Diagnosis not present

## 2016-08-11 DIAGNOSIS — N186 End stage renal disease: Secondary | ICD-10-CM | POA: Diagnosis not present

## 2016-08-11 DIAGNOSIS — Z992 Dependence on renal dialysis: Secondary | ICD-10-CM | POA: Diagnosis not present

## 2016-08-11 DIAGNOSIS — D509 Iron deficiency anemia, unspecified: Secondary | ICD-10-CM | POA: Diagnosis not present

## 2016-08-11 DIAGNOSIS — N2581 Secondary hyperparathyroidism of renal origin: Secondary | ICD-10-CM | POA: Diagnosis not present

## 2016-08-12 DIAGNOSIS — Z992 Dependence on renal dialysis: Secondary | ICD-10-CM | POA: Diagnosis not present

## 2016-08-12 DIAGNOSIS — N186 End stage renal disease: Secondary | ICD-10-CM | POA: Diagnosis not present

## 2016-08-13 DIAGNOSIS — N2581 Secondary hyperparathyroidism of renal origin: Secondary | ICD-10-CM | POA: Diagnosis not present

## 2016-08-13 DIAGNOSIS — D631 Anemia in chronic kidney disease: Secondary | ICD-10-CM | POA: Diagnosis not present

## 2016-08-13 DIAGNOSIS — E8779 Other fluid overload: Secondary | ICD-10-CM | POA: Diagnosis not present

## 2016-08-13 DIAGNOSIS — N186 End stage renal disease: Secondary | ICD-10-CM | POA: Diagnosis not present

## 2016-08-13 DIAGNOSIS — Z992 Dependence on renal dialysis: Secondary | ICD-10-CM | POA: Diagnosis not present

## 2016-08-16 DIAGNOSIS — N186 End stage renal disease: Secondary | ICD-10-CM | POA: Diagnosis not present

## 2016-08-16 DIAGNOSIS — E8779 Other fluid overload: Secondary | ICD-10-CM | POA: Diagnosis not present

## 2016-08-16 DIAGNOSIS — N2581 Secondary hyperparathyroidism of renal origin: Secondary | ICD-10-CM | POA: Diagnosis not present

## 2016-08-16 DIAGNOSIS — Z992 Dependence on renal dialysis: Secondary | ICD-10-CM | POA: Diagnosis not present

## 2016-08-16 DIAGNOSIS — D631 Anemia in chronic kidney disease: Secondary | ICD-10-CM | POA: Diagnosis not present

## 2016-08-18 DIAGNOSIS — E8779 Other fluid overload: Secondary | ICD-10-CM | POA: Diagnosis not present

## 2016-08-18 DIAGNOSIS — N186 End stage renal disease: Secondary | ICD-10-CM | POA: Diagnosis not present

## 2016-08-18 DIAGNOSIS — D631 Anemia in chronic kidney disease: Secondary | ICD-10-CM | POA: Diagnosis not present

## 2016-08-18 DIAGNOSIS — Z992 Dependence on renal dialysis: Secondary | ICD-10-CM | POA: Diagnosis not present

## 2016-08-18 DIAGNOSIS — N2581 Secondary hyperparathyroidism of renal origin: Secondary | ICD-10-CM | POA: Diagnosis not present

## 2016-08-20 DIAGNOSIS — N186 End stage renal disease: Secondary | ICD-10-CM | POA: Diagnosis not present

## 2016-08-20 DIAGNOSIS — N2581 Secondary hyperparathyroidism of renal origin: Secondary | ICD-10-CM | POA: Diagnosis not present

## 2016-08-20 DIAGNOSIS — Z992 Dependence on renal dialysis: Secondary | ICD-10-CM | POA: Diagnosis not present

## 2016-08-20 DIAGNOSIS — D631 Anemia in chronic kidney disease: Secondary | ICD-10-CM | POA: Diagnosis not present

## 2016-08-20 DIAGNOSIS — E8779 Other fluid overload: Secondary | ICD-10-CM | POA: Diagnosis not present

## 2016-08-23 DIAGNOSIS — N2581 Secondary hyperparathyroidism of renal origin: Secondary | ICD-10-CM | POA: Diagnosis not present

## 2016-08-23 DIAGNOSIS — Z992 Dependence on renal dialysis: Secondary | ICD-10-CM | POA: Diagnosis not present

## 2016-08-23 DIAGNOSIS — E8779 Other fluid overload: Secondary | ICD-10-CM | POA: Diagnosis not present

## 2016-08-23 DIAGNOSIS — N186 End stage renal disease: Secondary | ICD-10-CM | POA: Diagnosis not present

## 2016-08-23 DIAGNOSIS — D631 Anemia in chronic kidney disease: Secondary | ICD-10-CM | POA: Diagnosis not present

## 2016-08-24 DIAGNOSIS — E8779 Other fluid overload: Secondary | ICD-10-CM | POA: Diagnosis not present

## 2016-08-24 DIAGNOSIS — N186 End stage renal disease: Secondary | ICD-10-CM | POA: Diagnosis not present

## 2016-08-24 DIAGNOSIS — N2581 Secondary hyperparathyroidism of renal origin: Secondary | ICD-10-CM | POA: Diagnosis not present

## 2016-08-24 DIAGNOSIS — Z992 Dependence on renal dialysis: Secondary | ICD-10-CM | POA: Diagnosis not present

## 2016-08-24 DIAGNOSIS — D631 Anemia in chronic kidney disease: Secondary | ICD-10-CM | POA: Diagnosis not present

## 2016-08-25 DIAGNOSIS — N186 End stage renal disease: Secondary | ICD-10-CM | POA: Diagnosis not present

## 2016-08-25 DIAGNOSIS — E8779 Other fluid overload: Secondary | ICD-10-CM | POA: Diagnosis not present

## 2016-08-25 DIAGNOSIS — N2581 Secondary hyperparathyroidism of renal origin: Secondary | ICD-10-CM | POA: Diagnosis not present

## 2016-08-25 DIAGNOSIS — Z992 Dependence on renal dialysis: Secondary | ICD-10-CM | POA: Diagnosis not present

## 2016-08-25 DIAGNOSIS — D631 Anemia in chronic kidney disease: Secondary | ICD-10-CM | POA: Diagnosis not present

## 2016-08-27 DIAGNOSIS — D631 Anemia in chronic kidney disease: Secondary | ICD-10-CM | POA: Diagnosis not present

## 2016-08-27 DIAGNOSIS — E8779 Other fluid overload: Secondary | ICD-10-CM | POA: Diagnosis not present

## 2016-08-27 DIAGNOSIS — N2581 Secondary hyperparathyroidism of renal origin: Secondary | ICD-10-CM | POA: Diagnosis not present

## 2016-08-27 DIAGNOSIS — N186 End stage renal disease: Secondary | ICD-10-CM | POA: Diagnosis not present

## 2016-08-27 DIAGNOSIS — Z992 Dependence on renal dialysis: Secondary | ICD-10-CM | POA: Diagnosis not present

## 2016-08-30 DIAGNOSIS — D631 Anemia in chronic kidney disease: Secondary | ICD-10-CM | POA: Diagnosis not present

## 2016-08-30 DIAGNOSIS — E8779 Other fluid overload: Secondary | ICD-10-CM | POA: Diagnosis not present

## 2016-08-30 DIAGNOSIS — Z992 Dependence on renal dialysis: Secondary | ICD-10-CM | POA: Diagnosis not present

## 2016-08-30 DIAGNOSIS — N2581 Secondary hyperparathyroidism of renal origin: Secondary | ICD-10-CM | POA: Diagnosis not present

## 2016-08-30 DIAGNOSIS — N186 End stage renal disease: Secondary | ICD-10-CM | POA: Diagnosis not present

## 2016-09-01 DIAGNOSIS — N2581 Secondary hyperparathyroidism of renal origin: Secondary | ICD-10-CM | POA: Diagnosis not present

## 2016-09-01 DIAGNOSIS — N186 End stage renal disease: Secondary | ICD-10-CM | POA: Diagnosis not present

## 2016-09-01 DIAGNOSIS — D631 Anemia in chronic kidney disease: Secondary | ICD-10-CM | POA: Diagnosis not present

## 2016-09-01 DIAGNOSIS — E8779 Other fluid overload: Secondary | ICD-10-CM | POA: Diagnosis not present

## 2016-09-01 DIAGNOSIS — Z992 Dependence on renal dialysis: Secondary | ICD-10-CM | POA: Diagnosis not present

## 2016-09-04 DIAGNOSIS — N186 End stage renal disease: Secondary | ICD-10-CM | POA: Diagnosis not present

## 2016-09-04 DIAGNOSIS — N2581 Secondary hyperparathyroidism of renal origin: Secondary | ICD-10-CM | POA: Diagnosis not present

## 2016-09-04 DIAGNOSIS — E8779 Other fluid overload: Secondary | ICD-10-CM | POA: Diagnosis not present

## 2016-09-04 DIAGNOSIS — Z992 Dependence on renal dialysis: Secondary | ICD-10-CM | POA: Diagnosis not present

## 2016-09-04 DIAGNOSIS — D631 Anemia in chronic kidney disease: Secondary | ICD-10-CM | POA: Diagnosis not present

## 2016-09-07 DIAGNOSIS — N186 End stage renal disease: Secondary | ICD-10-CM | POA: Diagnosis not present

## 2016-09-07 DIAGNOSIS — Z992 Dependence on renal dialysis: Secondary | ICD-10-CM | POA: Diagnosis not present

## 2016-09-07 DIAGNOSIS — E8779 Other fluid overload: Secondary | ICD-10-CM | POA: Diagnosis not present

## 2016-09-07 DIAGNOSIS — N2581 Secondary hyperparathyroidism of renal origin: Secondary | ICD-10-CM | POA: Diagnosis not present

## 2016-09-07 DIAGNOSIS — D631 Anemia in chronic kidney disease: Secondary | ICD-10-CM | POA: Diagnosis not present

## 2016-09-08 DIAGNOSIS — Z992 Dependence on renal dialysis: Secondary | ICD-10-CM | POA: Diagnosis not present

## 2016-09-08 DIAGNOSIS — E8779 Other fluid overload: Secondary | ICD-10-CM | POA: Diagnosis not present

## 2016-09-08 DIAGNOSIS — N186 End stage renal disease: Secondary | ICD-10-CM | POA: Diagnosis not present

## 2016-09-08 DIAGNOSIS — D631 Anemia in chronic kidney disease: Secondary | ICD-10-CM | POA: Diagnosis not present

## 2016-09-08 DIAGNOSIS — N2581 Secondary hyperparathyroidism of renal origin: Secondary | ICD-10-CM | POA: Diagnosis not present

## 2016-09-10 DIAGNOSIS — D631 Anemia in chronic kidney disease: Secondary | ICD-10-CM | POA: Diagnosis not present

## 2016-09-10 DIAGNOSIS — N2581 Secondary hyperparathyroidism of renal origin: Secondary | ICD-10-CM | POA: Diagnosis not present

## 2016-09-10 DIAGNOSIS — Z992 Dependence on renal dialysis: Secondary | ICD-10-CM | POA: Diagnosis not present

## 2016-09-10 DIAGNOSIS — E8779 Other fluid overload: Secondary | ICD-10-CM | POA: Diagnosis not present

## 2016-09-10 DIAGNOSIS — N186 End stage renal disease: Secondary | ICD-10-CM | POA: Diagnosis not present

## 2016-09-12 DIAGNOSIS — Z992 Dependence on renal dialysis: Secondary | ICD-10-CM | POA: Diagnosis not present

## 2016-09-12 DIAGNOSIS — N186 End stage renal disease: Secondary | ICD-10-CM | POA: Diagnosis not present

## 2016-09-13 DIAGNOSIS — Z992 Dependence on renal dialysis: Secondary | ICD-10-CM | POA: Diagnosis not present

## 2016-09-13 DIAGNOSIS — D509 Iron deficiency anemia, unspecified: Secondary | ICD-10-CM | POA: Diagnosis not present

## 2016-09-13 DIAGNOSIS — N2581 Secondary hyperparathyroidism of renal origin: Secondary | ICD-10-CM | POA: Diagnosis not present

## 2016-09-13 DIAGNOSIS — N186 End stage renal disease: Secondary | ICD-10-CM | POA: Diagnosis not present

## 2016-09-13 DIAGNOSIS — D631 Anemia in chronic kidney disease: Secondary | ICD-10-CM | POA: Diagnosis not present

## 2016-09-15 DIAGNOSIS — D509 Iron deficiency anemia, unspecified: Secondary | ICD-10-CM | POA: Diagnosis not present

## 2016-09-15 DIAGNOSIS — N2581 Secondary hyperparathyroidism of renal origin: Secondary | ICD-10-CM | POA: Diagnosis not present

## 2016-09-15 DIAGNOSIS — Z992 Dependence on renal dialysis: Secondary | ICD-10-CM | POA: Diagnosis not present

## 2016-09-15 DIAGNOSIS — D631 Anemia in chronic kidney disease: Secondary | ICD-10-CM | POA: Diagnosis not present

## 2016-09-15 DIAGNOSIS — N186 End stage renal disease: Secondary | ICD-10-CM | POA: Diagnosis not present

## 2016-09-17 DIAGNOSIS — D509 Iron deficiency anemia, unspecified: Secondary | ICD-10-CM | POA: Diagnosis not present

## 2016-09-17 DIAGNOSIS — N2581 Secondary hyperparathyroidism of renal origin: Secondary | ICD-10-CM | POA: Diagnosis not present

## 2016-09-17 DIAGNOSIS — Z992 Dependence on renal dialysis: Secondary | ICD-10-CM | POA: Diagnosis not present

## 2016-09-17 DIAGNOSIS — N186 End stage renal disease: Secondary | ICD-10-CM | POA: Diagnosis not present

## 2016-09-17 DIAGNOSIS — D631 Anemia in chronic kidney disease: Secondary | ICD-10-CM | POA: Diagnosis not present

## 2016-09-20 DIAGNOSIS — D509 Iron deficiency anemia, unspecified: Secondary | ICD-10-CM | POA: Diagnosis not present

## 2016-09-20 DIAGNOSIS — N186 End stage renal disease: Secondary | ICD-10-CM | POA: Diagnosis not present

## 2016-09-20 DIAGNOSIS — D631 Anemia in chronic kidney disease: Secondary | ICD-10-CM | POA: Diagnosis not present

## 2016-09-20 DIAGNOSIS — N2581 Secondary hyperparathyroidism of renal origin: Secondary | ICD-10-CM | POA: Diagnosis not present

## 2016-09-20 DIAGNOSIS — Z992 Dependence on renal dialysis: Secondary | ICD-10-CM | POA: Diagnosis not present

## 2016-09-22 DIAGNOSIS — N2581 Secondary hyperparathyroidism of renal origin: Secondary | ICD-10-CM | POA: Diagnosis not present

## 2016-09-22 DIAGNOSIS — D509 Iron deficiency anemia, unspecified: Secondary | ICD-10-CM | POA: Diagnosis not present

## 2016-09-22 DIAGNOSIS — D631 Anemia in chronic kidney disease: Secondary | ICD-10-CM | POA: Diagnosis not present

## 2016-09-22 DIAGNOSIS — Z992 Dependence on renal dialysis: Secondary | ICD-10-CM | POA: Diagnosis not present

## 2016-09-22 DIAGNOSIS — N186 End stage renal disease: Secondary | ICD-10-CM | POA: Diagnosis not present

## 2016-09-24 DIAGNOSIS — N2581 Secondary hyperparathyroidism of renal origin: Secondary | ICD-10-CM | POA: Diagnosis not present

## 2016-09-24 DIAGNOSIS — Z992 Dependence on renal dialysis: Secondary | ICD-10-CM | POA: Diagnosis not present

## 2016-09-24 DIAGNOSIS — N186 End stage renal disease: Secondary | ICD-10-CM | POA: Diagnosis not present

## 2016-09-24 DIAGNOSIS — D509 Iron deficiency anemia, unspecified: Secondary | ICD-10-CM | POA: Diagnosis not present

## 2016-09-24 DIAGNOSIS — D631 Anemia in chronic kidney disease: Secondary | ICD-10-CM | POA: Diagnosis not present

## 2016-09-27 DIAGNOSIS — D631 Anemia in chronic kidney disease: Secondary | ICD-10-CM | POA: Diagnosis not present

## 2016-09-27 DIAGNOSIS — N186 End stage renal disease: Secondary | ICD-10-CM | POA: Diagnosis not present

## 2016-09-27 DIAGNOSIS — N2581 Secondary hyperparathyroidism of renal origin: Secondary | ICD-10-CM | POA: Diagnosis not present

## 2016-09-27 DIAGNOSIS — Z992 Dependence on renal dialysis: Secondary | ICD-10-CM | POA: Diagnosis not present

## 2016-09-27 DIAGNOSIS — D509 Iron deficiency anemia, unspecified: Secondary | ICD-10-CM | POA: Diagnosis not present

## 2016-09-29 DIAGNOSIS — D631 Anemia in chronic kidney disease: Secondary | ICD-10-CM | POA: Diagnosis not present

## 2016-09-29 DIAGNOSIS — Z992 Dependence on renal dialysis: Secondary | ICD-10-CM | POA: Diagnosis not present

## 2016-09-29 DIAGNOSIS — D509 Iron deficiency anemia, unspecified: Secondary | ICD-10-CM | POA: Diagnosis not present

## 2016-09-29 DIAGNOSIS — N2581 Secondary hyperparathyroidism of renal origin: Secondary | ICD-10-CM | POA: Diagnosis not present

## 2016-09-29 DIAGNOSIS — N186 End stage renal disease: Secondary | ICD-10-CM | POA: Diagnosis not present

## 2016-10-01 DIAGNOSIS — N186 End stage renal disease: Secondary | ICD-10-CM | POA: Diagnosis not present

## 2016-10-01 DIAGNOSIS — D631 Anemia in chronic kidney disease: Secondary | ICD-10-CM | POA: Diagnosis not present

## 2016-10-01 DIAGNOSIS — D509 Iron deficiency anemia, unspecified: Secondary | ICD-10-CM | POA: Diagnosis not present

## 2016-10-01 DIAGNOSIS — Z992 Dependence on renal dialysis: Secondary | ICD-10-CM | POA: Diagnosis not present

## 2016-10-01 DIAGNOSIS — N2581 Secondary hyperparathyroidism of renal origin: Secondary | ICD-10-CM | POA: Diagnosis not present

## 2016-10-04 ENCOUNTER — Other Ambulatory Visit (INDEPENDENT_AMBULATORY_CARE_PROVIDER_SITE_OTHER): Payer: Self-pay | Admitting: Vascular Surgery

## 2016-10-04 DIAGNOSIS — N186 End stage renal disease: Secondary | ICD-10-CM | POA: Diagnosis not present

## 2016-10-04 DIAGNOSIS — Z992 Dependence on renal dialysis: Principal | ICD-10-CM

## 2016-10-04 DIAGNOSIS — T829XXA Unspecified complication of cardiac and vascular prosthetic device, implant and graft, initial encounter: Secondary | ICD-10-CM

## 2016-10-04 DIAGNOSIS — D631 Anemia in chronic kidney disease: Secondary | ICD-10-CM | POA: Diagnosis not present

## 2016-10-04 DIAGNOSIS — N2581 Secondary hyperparathyroidism of renal origin: Secondary | ICD-10-CM | POA: Diagnosis not present

## 2016-10-04 DIAGNOSIS — D509 Iron deficiency anemia, unspecified: Secondary | ICD-10-CM | POA: Diagnosis not present

## 2016-10-05 ENCOUNTER — Ambulatory Visit (INDEPENDENT_AMBULATORY_CARE_PROVIDER_SITE_OTHER): Payer: Medicare Other

## 2016-10-05 ENCOUNTER — Ambulatory Visit (INDEPENDENT_AMBULATORY_CARE_PROVIDER_SITE_OTHER): Payer: Medicare Other | Admitting: Vascular Surgery

## 2016-10-05 ENCOUNTER — Encounter (INDEPENDENT_AMBULATORY_CARE_PROVIDER_SITE_OTHER): Payer: Self-pay | Admitting: Vascular Surgery

## 2016-10-05 VITALS — BP 154/90 | HR 74 | Resp 16 | Ht 68.0 in | Wt 163.0 lb

## 2016-10-05 DIAGNOSIS — Z992 Dependence on renal dialysis: Secondary | ICD-10-CM

## 2016-10-05 DIAGNOSIS — T829XXA Unspecified complication of cardiac and vascular prosthetic device, implant and graft, initial encounter: Secondary | ICD-10-CM | POA: Diagnosis not present

## 2016-10-05 DIAGNOSIS — Z720 Tobacco use: Secondary | ICD-10-CM

## 2016-10-05 DIAGNOSIS — N186 End stage renal disease: Secondary | ICD-10-CM | POA: Insufficient documentation

## 2016-10-05 DIAGNOSIS — E785 Hyperlipidemia, unspecified: Secondary | ICD-10-CM

## 2016-10-05 NOTE — Progress Notes (Signed)
Subjective:    Patient ID: Daryl Weeks, male    DOB: 30-Mar-1961, 56 y.o.   MRN: [id number] Chief Complaint  Patient presents with  . Follow-up   Patient presents for a six month hemodialysis access follow up. The patient underwent a duplex ultrasound of the AV access which was notable for a patent fistula without any significant hemodynamic stenosis. The patient denies any issues with hemodialysis such as cannulation problems, increased bleeding, decrease in doppler flow or recirculation. The patient also denies any fistula skin breakdown, pain, edema, pallor or ulceration of the arm / hand.   Review of Systems  Constitutional: Negative.   HENT: Negative.   Eyes: Negative.   Respiratory: Negative.   Cardiovascular: Negative.   Gastrointestinal: Negative.   Endocrine: Negative.   Genitourinary:       ESRD  Musculoskeletal: Negative.   Skin: Negative.   Allergic/Immunologic: Negative.   Neurological: Negative.   Hematological: Negative.   Psychiatric/Behavioral: Negative.       Objective:   Physical Exam  Constitutional: He is oriented to person, place, and time. He appears well-developed and well-nourished.  HENT:  Head: Normocephalic and atraumatic.  Right Ear: External ear normal.  Left Ear: External ear normal.  Eyes: Conjunctivae and EOM are normal. Pupils are equal, round, and reactive to light.  Neck: Normal range of motion.  Cardiovascular: Normal rate, regular rhythm, normal heart sounds and intact distal pulses.   Pulses:      Radial pulses are 2+ on the right side, and 2+ on the left side.       Dorsalis pedis pulses are 1+ on the right side, and 1+ on the left side.       Posterior tibial pulses are 1+ on the right side, and 1+ on the left side.  Left Upper Extremity Fistula: Aneurysmal however no skin threatening is noted. Good bruit and thrill.   Pulmonary/Chest: Effort normal and breath sounds normal.  Abdominal: Soft. Bowel sounds are normal.    Musculoskeletal: Normal range of motion. He exhibits no edema.  Neurological: He is alert and oriented to person, place, and time.  Skin: Skin is warm and dry.  Psychiatric: He has a normal mood and affect. His behavior is normal. Judgment and thought content normal.   BP (!) 154/90   Pulse 74   Resp 16   Ht 5\' 8"  (1.727 m)   Wt 163 lb (73.9 kg)   BMI 24.78 kg/m   Past Medical History:  Diagnosis Date  . Anemia   . Arthritis   . CHF (congestive heart failure) ([place])   . Colon polyps   . COPD (chronic obstructive pulmonary disease) ([place])   . Coronary artery disease    Non-ST elevation myocardial infarction in February of 2015. In the setting of hypertensive urgency and blood loss anemia. Cardiac catheterization showed significant three-vessel coronary artery disease. EF was 55% by echo. He underwent CABG at [facility] on May 19 with LIMA to LAD, Sequential SVG to OM1 and OM2, and SVG to RCA  . End stage renal disease ([place])    m-w-f   -[place]  . ETOH abuse   . GERD (gastroesophageal reflux disease)    hx  . Hyperlipidemia   . Hypertension   . IgA nephropathy   . Lower GI bleed    Due to colon polyps. Status post resection of 14 polyps  . Non-ST elevation MI (NSTEMI) ([place])   . Pneumonia    hx  .  Shortness of breath   . Tobacco abuse   . Tobacco abuse    Social History   Social History  . Marital status: Single    Spouse name: N/A  . Number of children: N/A  . Years of education: N/A   Occupational History  . Not on file.   Social History Main Topics  . Smoking status: Current Every Day Smoker    Packs/day: 0.50    Years: 30.00    Types: Cigarettes  . Smokeless tobacco: Never Used  . Alcohol use 1.2 oz/week    2 Cans of beer per week     Comment:  less now  . Drug use: No  . Sexual activity: Not on file   Other Topics Concern  . Not on file   Social History Narrative  . No narrative on file   Past Surgical History:  Procedure Laterality Date  .  AV FISTULA PLACEMENT    . CARDIAC CATHETERIZATION     RCA 90% and calcified mid LAD 80% Stenosis  . CORONARY ARTERY BYPASS GRAFT N/A 11/29/2013   Procedure: CORONARY ARTERY BYPASS GRAFTING (CABG) x 4 using endoscopically harvested right saphenous vein and left internal mammary artery;  Surgeon: [name], MD;  Location: [facility] OR;  Service: Open Heart Surgery;  Laterality: N/A;  . dialysis catheterr  2/15  . INTRAOPERATIVE TRANSESOPHAGEAL ECHOCARDIOGRAM N/A 11/29/2013   Procedure: INTRAOPERATIVE TRANSESOPHAGEAL ECHOCARDIOGRAM;  Surgeon: [name], MD;  Location: [facility] OR;  Service: Open Heart Surgery;  Laterality: N/A;  . PERIPHERAL VASCULAR CATHETERIZATION N/A 06/12/2015   Procedure: A/V Shuntogram/Fistulagram;  Surgeon: [name], MD;  Location: [place] [facility];  Service: Cardiovascular;  Laterality: N/A;  . PERIPHERAL VASCULAR CATHETERIZATION Left 06/12/2015   Procedure: A/V Shunt Intervention;  Surgeon: [name], MD;  Location: [place] [facility];  Service: Cardiovascular;  Laterality: Left;  . RENAL BIOPSY Left 14   Family History  Problem Relation Age of Onset  . Heart disease Father   . Heart disease Brother    No Known Allergies     Assessment & Plan:  Patient presents for a six month hemodialysis access follow up. The patient underwent a duplex ultrasound of the AV access which was notable for a patent fistula without any significant hemodynamic stenosis. The patient denies any issues with hemodialysis such as cannulation problems, increased bleeding, decrease in doppler flow or recirculation. The patient also denies any fistula skin breakdown, pain, edema, pallor or ulceration of the arm / hand.  1. ESRD on dialysis ([place]) - Stable Studies reviewed with patient. The patient is doing well and currently has adequate dialysis access. Duplex ultrasound of the AV access shows a patent access with no evidence of hemodynamically significant strictures or stenosis.  The  patient should continue to have duplex ultrasounds of the dialysis access every six months. The patient was instructed to call the office in the interim if any issues with dialysis access / doppler flow, pain, edema, pallor, fistula skin breakdown or ulceration of the arm / hand occur. The patient expressed their understanding.  2. Tobacco abuse - Stable I have discussed (approximately 5 minutes) with the patient the role of tobacco in the pathogenesis of atherosclerosis and its effect on the progression of the disease, impact on the durability of interventions and its limitations on the formation of collateral pathways. I have recommended absolute tobacco cessation. I have discussed various options available for assistance with tobacco cessation including over the counter  methods (Nicotine gum, patch and lozenges). We also discussed prescription options (Chantix, Nicotine Inhaler / Nasal Spray). The patient is not interested in pursuing any prescription tobacco cessation options at this time. The patient voices their understanding.   3. Hyperlipidemia, unspecified hyperlipidemia type - Stable Encouraged good control as its slows the progression of atherosclerotic disease   Current Outpatient Prescriptions on File Prior to Visit  Medication Sig Dispense Refill  . amLODipine (NORVASC) 10 MG tablet take 1 tablet by mouth once daily 30 tablet 5  . aspirin EC 81 MG tablet Take 1 tablet (81 mg total) by mouth daily. 90 tablet 3  . atorvastatin (LIPITOR) 20 MG tablet take 1 tablet by mouth at bedtime 30 tablet 6  . cinacalcet (SENSIPAR) 30 MG tablet Take 30 mg by mouth daily.    . furosemide (LASIX) 80 MG tablet Take 80 mg by mouth daily.    . metoprolol (LOPRESSOR) 50 MG tablet take 1 tablet by mouth twice a day 60 tablet 6  . traMADol (ULTRAM) 50 MG tablet Take 1 tablet (50 mg total) by mouth every 6 (six) hours as needed. 40 tablet 0  . zolpidem (AMBIEN) 5 MG tablet Take 1 tablet (5 mg total) by  mouth at bedtime as needed for sleep. 30 tablet 0   No current facility-administered medications on file prior to visit.     There are no Patient Instructions on file for this visit. No Follow-up on file.   Rolla Servidio A Tanvi Gatling, PA-C

## 2016-10-06 DIAGNOSIS — D631 Anemia in chronic kidney disease: Secondary | ICD-10-CM | POA: Diagnosis not present

## 2016-10-06 DIAGNOSIS — N186 End stage renal disease: Secondary | ICD-10-CM | POA: Diagnosis not present

## 2016-10-06 DIAGNOSIS — D509 Iron deficiency anemia, unspecified: Secondary | ICD-10-CM | POA: Diagnosis not present

## 2016-10-06 DIAGNOSIS — N2581 Secondary hyperparathyroidism of renal origin: Secondary | ICD-10-CM | POA: Diagnosis not present

## 2016-10-06 DIAGNOSIS — Z992 Dependence on renal dialysis: Secondary | ICD-10-CM | POA: Diagnosis not present

## 2016-10-07 DIAGNOSIS — H00016 Hordeolum externum left eye, unspecified eyelid: Secondary | ICD-10-CM | POA: Diagnosis not present

## 2016-10-08 DIAGNOSIS — N2581 Secondary hyperparathyroidism of renal origin: Secondary | ICD-10-CM | POA: Diagnosis not present

## 2016-10-08 DIAGNOSIS — D631 Anemia in chronic kidney disease: Secondary | ICD-10-CM | POA: Diagnosis not present

## 2016-10-08 DIAGNOSIS — N186 End stage renal disease: Secondary | ICD-10-CM | POA: Diagnosis not present

## 2016-10-08 DIAGNOSIS — D509 Iron deficiency anemia, unspecified: Secondary | ICD-10-CM | POA: Diagnosis not present

## 2016-10-08 DIAGNOSIS — Z992 Dependence on renal dialysis: Secondary | ICD-10-CM | POA: Diagnosis not present

## 2016-10-11 DIAGNOSIS — D631 Anemia in chronic kidney disease: Secondary | ICD-10-CM | POA: Diagnosis not present

## 2016-10-11 DIAGNOSIS — N2581 Secondary hyperparathyroidism of renal origin: Secondary | ICD-10-CM | POA: Diagnosis not present

## 2016-10-11 DIAGNOSIS — Z992 Dependence on renal dialysis: Secondary | ICD-10-CM | POA: Diagnosis not present

## 2016-10-11 DIAGNOSIS — D509 Iron deficiency anemia, unspecified: Secondary | ICD-10-CM | POA: Diagnosis not present

## 2016-10-11 DIAGNOSIS — N186 End stage renal disease: Secondary | ICD-10-CM | POA: Diagnosis not present

## 2016-10-13 DIAGNOSIS — D631 Anemia in chronic kidney disease: Secondary | ICD-10-CM | POA: Diagnosis not present

## 2016-10-13 DIAGNOSIS — N186 End stage renal disease: Secondary | ICD-10-CM | POA: Diagnosis not present

## 2016-10-13 DIAGNOSIS — D509 Iron deficiency anemia, unspecified: Secondary | ICD-10-CM | POA: Diagnosis not present

## 2016-10-13 DIAGNOSIS — N2581 Secondary hyperparathyroidism of renal origin: Secondary | ICD-10-CM | POA: Diagnosis not present

## 2016-10-13 DIAGNOSIS — Z992 Dependence on renal dialysis: Secondary | ICD-10-CM | POA: Diagnosis not present

## 2016-10-14 DIAGNOSIS — N186 End stage renal disease: Secondary | ICD-10-CM | POA: Diagnosis not present

## 2016-10-14 DIAGNOSIS — N2581 Secondary hyperparathyroidism of renal origin: Secondary | ICD-10-CM | POA: Diagnosis not present

## 2016-10-14 DIAGNOSIS — D509 Iron deficiency anemia, unspecified: Secondary | ICD-10-CM | POA: Diagnosis not present

## 2016-10-14 DIAGNOSIS — E8779 Other fluid overload: Secondary | ICD-10-CM | POA: Diagnosis not present

## 2016-10-14 DIAGNOSIS — Z992 Dependence on renal dialysis: Secondary | ICD-10-CM | POA: Diagnosis not present

## 2016-10-14 DIAGNOSIS — D631 Anemia in chronic kidney disease: Secondary | ICD-10-CM | POA: Diagnosis not present

## 2016-10-15 DIAGNOSIS — E8779 Other fluid overload: Secondary | ICD-10-CM | POA: Diagnosis not present

## 2016-10-15 DIAGNOSIS — D509 Iron deficiency anemia, unspecified: Secondary | ICD-10-CM | POA: Diagnosis not present

## 2016-10-15 DIAGNOSIS — D631 Anemia in chronic kidney disease: Secondary | ICD-10-CM | POA: Diagnosis not present

## 2016-10-15 DIAGNOSIS — N186 End stage renal disease: Secondary | ICD-10-CM | POA: Diagnosis not present

## 2016-10-15 DIAGNOSIS — N2581 Secondary hyperparathyroidism of renal origin: Secondary | ICD-10-CM | POA: Diagnosis not present

## 2016-10-15 DIAGNOSIS — Z992 Dependence on renal dialysis: Secondary | ICD-10-CM | POA: Diagnosis not present

## 2016-10-18 DIAGNOSIS — D509 Iron deficiency anemia, unspecified: Secondary | ICD-10-CM | POA: Diagnosis not present

## 2016-10-18 DIAGNOSIS — E8779 Other fluid overload: Secondary | ICD-10-CM | POA: Diagnosis not present

## 2016-10-18 DIAGNOSIS — Z992 Dependence on renal dialysis: Secondary | ICD-10-CM | POA: Diagnosis not present

## 2016-10-18 DIAGNOSIS — N186 End stage renal disease: Secondary | ICD-10-CM | POA: Diagnosis not present

## 2016-10-18 DIAGNOSIS — D631 Anemia in chronic kidney disease: Secondary | ICD-10-CM | POA: Diagnosis not present

## 2016-10-18 DIAGNOSIS — N2581 Secondary hyperparathyroidism of renal origin: Secondary | ICD-10-CM | POA: Diagnosis not present

## 2016-10-20 DIAGNOSIS — D509 Iron deficiency anemia, unspecified: Secondary | ICD-10-CM | POA: Diagnosis not present

## 2016-10-20 DIAGNOSIS — D631 Anemia in chronic kidney disease: Secondary | ICD-10-CM | POA: Diagnosis not present

## 2016-10-20 DIAGNOSIS — E8779 Other fluid overload: Secondary | ICD-10-CM | POA: Diagnosis not present

## 2016-10-20 DIAGNOSIS — N186 End stage renal disease: Secondary | ICD-10-CM | POA: Diagnosis not present

## 2016-10-20 DIAGNOSIS — N2581 Secondary hyperparathyroidism of renal origin: Secondary | ICD-10-CM | POA: Diagnosis not present

## 2016-10-20 DIAGNOSIS — Z992 Dependence on renal dialysis: Secondary | ICD-10-CM | POA: Diagnosis not present

## 2016-10-22 DIAGNOSIS — E8779 Other fluid overload: Secondary | ICD-10-CM | POA: Diagnosis not present

## 2016-10-22 DIAGNOSIS — N186 End stage renal disease: Secondary | ICD-10-CM | POA: Diagnosis not present

## 2016-10-22 DIAGNOSIS — N2581 Secondary hyperparathyroidism of renal origin: Secondary | ICD-10-CM | POA: Diagnosis not present

## 2016-10-22 DIAGNOSIS — D631 Anemia in chronic kidney disease: Secondary | ICD-10-CM | POA: Diagnosis not present

## 2016-10-22 DIAGNOSIS — Z992 Dependence on renal dialysis: Secondary | ICD-10-CM | POA: Diagnosis not present

## 2016-10-22 DIAGNOSIS — D509 Iron deficiency anemia, unspecified: Secondary | ICD-10-CM | POA: Diagnosis not present

## 2016-10-25 DIAGNOSIS — D631 Anemia in chronic kidney disease: Secondary | ICD-10-CM | POA: Diagnosis not present

## 2016-10-25 DIAGNOSIS — Z992 Dependence on renal dialysis: Secondary | ICD-10-CM | POA: Diagnosis not present

## 2016-10-25 DIAGNOSIS — D509 Iron deficiency anemia, unspecified: Secondary | ICD-10-CM | POA: Diagnosis not present

## 2016-10-25 DIAGNOSIS — E8779 Other fluid overload: Secondary | ICD-10-CM | POA: Diagnosis not present

## 2016-10-25 DIAGNOSIS — N2581 Secondary hyperparathyroidism of renal origin: Secondary | ICD-10-CM | POA: Diagnosis not present

## 2016-10-25 DIAGNOSIS — N186 End stage renal disease: Secondary | ICD-10-CM | POA: Diagnosis not present

## 2016-10-26 ENCOUNTER — Encounter (INDEPENDENT_AMBULATORY_CARE_PROVIDER_SITE_OTHER): Payer: Self-pay

## 2016-10-26 ENCOUNTER — Other Ambulatory Visit (INDEPENDENT_AMBULATORY_CARE_PROVIDER_SITE_OTHER): Payer: Self-pay

## 2016-10-26 DIAGNOSIS — Z992 Dependence on renal dialysis: Secondary | ICD-10-CM | POA: Diagnosis not present

## 2016-10-26 DIAGNOSIS — N186 End stage renal disease: Secondary | ICD-10-CM | POA: Diagnosis not present

## 2016-10-26 DIAGNOSIS — E8779 Other fluid overload: Secondary | ICD-10-CM | POA: Diagnosis not present

## 2016-10-26 DIAGNOSIS — D631 Anemia in chronic kidney disease: Secondary | ICD-10-CM | POA: Diagnosis not present

## 2016-10-26 DIAGNOSIS — D509 Iron deficiency anemia, unspecified: Secondary | ICD-10-CM | POA: Diagnosis not present

## 2016-10-26 DIAGNOSIS — N2581 Secondary hyperparathyroidism of renal origin: Secondary | ICD-10-CM | POA: Diagnosis not present

## 2016-10-27 DIAGNOSIS — N186 End stage renal disease: Secondary | ICD-10-CM | POA: Diagnosis not present

## 2016-10-27 DIAGNOSIS — Z992 Dependence on renal dialysis: Secondary | ICD-10-CM | POA: Diagnosis not present

## 2016-10-27 DIAGNOSIS — D631 Anemia in chronic kidney disease: Secondary | ICD-10-CM | POA: Diagnosis not present

## 2016-10-27 DIAGNOSIS — D509 Iron deficiency anemia, unspecified: Secondary | ICD-10-CM | POA: Diagnosis not present

## 2016-10-27 DIAGNOSIS — E8779 Other fluid overload: Secondary | ICD-10-CM | POA: Diagnosis not present

## 2016-10-27 DIAGNOSIS — N2581 Secondary hyperparathyroidism of renal origin: Secondary | ICD-10-CM | POA: Diagnosis not present

## 2016-10-28 ENCOUNTER — Encounter
Admission: RE | Disposition: A | Discharge status: Home / Self Care | Payer: Self-pay | Source: Ambulatory Visit | Attending: Vascular Surgery

## 2016-10-28 ENCOUNTER — Ambulatory Visit
Admission: RE | Admit: 2016-10-28 | Discharge: 2016-10-28 | Disposition: A | Discharge status: Home / Self Care | Payer: Medicare Other | Source: Ambulatory Visit | Attending: Vascular Surgery | Admitting: Vascular Surgery

## 2016-10-28 DIAGNOSIS — I2510 Atherosclerotic heart disease of native coronary artery without angina pectoris: Secondary | ICD-10-CM | POA: Insufficient documentation

## 2016-10-28 DIAGNOSIS — M1990 Unspecified osteoarthritis, unspecified site: Secondary | ICD-10-CM | POA: Diagnosis not present

## 2016-10-28 DIAGNOSIS — D638 Anemia in other chronic diseases classified elsewhere: Secondary | ICD-10-CM | POA: Insufficient documentation

## 2016-10-28 DIAGNOSIS — F17210 Nicotine dependence, cigarettes, uncomplicated: Secondary | ICD-10-CM | POA: Diagnosis not present

## 2016-10-28 DIAGNOSIS — T82858A Stenosis of vascular prosthetic devices, implants and grafts, initial encounter: Secondary | ICD-10-CM | POA: Diagnosis not present

## 2016-10-28 DIAGNOSIS — I509 Heart failure, unspecified: Secondary | ICD-10-CM | POA: Insufficient documentation

## 2016-10-28 DIAGNOSIS — K219 Gastro-esophageal reflux disease without esophagitis: Secondary | ICD-10-CM | POA: Diagnosis not present

## 2016-10-28 DIAGNOSIS — I2584 Coronary atherosclerosis due to calcified coronary lesion: Secondary | ICD-10-CM | POA: Diagnosis not present

## 2016-10-28 DIAGNOSIS — I132 Hypertensive heart and chronic kidney disease with heart failure and with stage 5 chronic kidney disease, or end stage renal disease: Secondary | ICD-10-CM | POA: Diagnosis not present

## 2016-10-28 DIAGNOSIS — N186 End stage renal disease: Secondary | ICD-10-CM | POA: Diagnosis not present

## 2016-10-28 DIAGNOSIS — Z951 Presence of aortocoronary bypass graft: Secondary | ICD-10-CM | POA: Insufficient documentation

## 2016-10-28 DIAGNOSIS — Z8249 Family history of ischemic heart disease and other diseases of the circulatory system: Secondary | ICD-10-CM | POA: Diagnosis not present

## 2016-10-28 DIAGNOSIS — Y832 Surgical operation with anastomosis, bypass or graft as the cause of abnormal reaction of the patient, or of later complication, without mention of misadventure at the time of the procedure: Secondary | ICD-10-CM | POA: Diagnosis not present

## 2016-10-28 DIAGNOSIS — I252 Old myocardial infarction: Secondary | ICD-10-CM | POA: Diagnosis not present

## 2016-10-28 DIAGNOSIS — Z992 Dependence on renal dialysis: Secondary | ICD-10-CM | POA: Diagnosis not present

## 2016-10-28 DIAGNOSIS — J449 Chronic obstructive pulmonary disease, unspecified: Secondary | ICD-10-CM | POA: Diagnosis not present

## 2016-10-28 DIAGNOSIS — T82868A Thrombosis of vascular prosthetic devices, implants and grafts, initial encounter: Secondary | ICD-10-CM | POA: Diagnosis not present

## 2016-10-28 DIAGNOSIS — Z86010 Personal history of colonic polyps: Secondary | ICD-10-CM | POA: Insufficient documentation

## 2016-10-28 DIAGNOSIS — E785 Hyperlipidemia, unspecified: Secondary | ICD-10-CM | POA: Diagnosis not present

## 2016-10-28 DIAGNOSIS — I10 Essential (primary) hypertension: Secondary | ICD-10-CM | POA: Diagnosis not present

## 2016-10-28 HISTORY — PX: A/V FISTULAGRAM: CATH118298

## 2016-10-28 HISTORY — PX: A/V SHUNT INTERVENTION: CATH118220

## 2016-10-28 LAB — POTASSIUM (ARMC VASCULAR LAB ONLY): Potassium (ARMC vascular lab): 4.4 (ref 3.5–5.1)

## 2016-10-28 SURGERY — A/V FISTULAGRAM
Anesthesia: Moderate Sedation

## 2016-10-28 MED ORDER — CEFAZOLIN IN D5W 1 GM/50ML IV SOLN
1.0000 g | Freq: Once | INTRAVENOUS | Status: AC
  Administered 2016-10-28: 1 g via INTRAVENOUS

## 2016-10-28 MED ORDER — MIDAZOLAM HCL 2 MG/2ML IJ SOLN
INTRAMUSCULAR | Status: DC | PRN
  Administered 2016-10-28: 2 mg via INTRAVENOUS
  Administered 2016-10-28: 1 mg

## 2016-10-28 MED ORDER — SODIUM CHLORIDE 0.9 % IV SOLN: INTRAVENOUS | Status: DC

## 2016-10-28 MED ORDER — MIDAZOLAM HCL 5 MG/5ML IJ SOLN
INTRAMUSCULAR | Status: AC
  Filled 2016-10-28: qty 5

## 2016-10-28 MED ORDER — FENTANYL CITRATE (PF) 100 MCG/2ML IJ SOLN
INTRAMUSCULAR | Status: AC
  Filled 2016-10-28: qty 2

## 2016-10-28 MED ORDER — CEFAZOLIN IN D5W 1 GM/50ML IV SOLN: 1.0000 g | Freq: Once | INTRAVENOUS | Status: DC

## 2016-10-28 MED ORDER — HEPARIN SODIUM (PORCINE) 1000 UNIT/ML IJ SOLN
INTRAMUSCULAR | Status: AC
  Filled 2016-10-28: qty 1

## 2016-10-28 MED ORDER — HEPARIN SODIUM (PORCINE) 1000 UNIT/ML IJ SOLN
INTRAMUSCULAR | Status: DC | PRN
  Administered 2016-10-28: 3000 [IU] via INTRAVENOUS

## 2016-10-28 MED ORDER — LIDOCAINE-EPINEPHRINE (PF) 2 %-1:200000 IJ SOLN
INTRAMUSCULAR | Status: AC
  Filled 2016-10-28: qty 20

## 2016-10-28 MED ORDER — HEPARIN (PORCINE) IN NACL 2-0.9 UNIT/ML-% IJ SOLN
INTRAMUSCULAR | Status: AC
  Filled 2016-10-28: qty 1000

## 2016-10-28 MED ORDER — FENTANYL CITRATE (PF) 100 MCG/2ML IJ SOLN
INTRAMUSCULAR | Status: DC | PRN
  Administered 2016-10-28 (×2): 50 ug via INTRAVENOUS

## 2016-10-28 SURGICAL SUPPLY — 10 items
BALLN LUTONIX DCB 6X80X130 (BALLOONS) ×4
BALLOON LUTONIX DCB 6X80X130 (BALLOONS) ×2 IMPLANT
CANNULA 5F STIFF (CANNULA) ×4 IMPLANT
CATH TORCON 5FR 0.38 (CATHETERS) ×4 IMPLANT
DEVICE PRESTO INFLATION (MISCELLANEOUS) ×4 IMPLANT
DRAPE BRACHIAL (DRAPES) ×4 IMPLANT
PACK ANGIOGRAPHY (CUSTOM PROCEDURE TRAY) ×4 IMPLANT
SHEATH BRITE TIP 6FRX5.5 (SHEATH) ×4 IMPLANT
TOWEL OR 17X26 4PK STRL BLUE (TOWEL DISPOSABLE) ×4 IMPLANT
WIRE MAGIC TOR.035 180C (WIRE) ×4 IMPLANT

## 2016-10-28 NOTE — H&P (Signed)
[facility] History & Physical Update  The patient was interviewed and re-examined.  The patient's previous History and Physical has been reviewed and is unchanged.  There is no change in the plan of care. We plan to proceed with the scheduled procedure.  Leotis Pain, MD  10/28/2016, 9:00 AM

## 2016-10-28 NOTE — Op Note (Signed)
[facility]    OPERATIVE NOTE   PROCEDURE: 1.   Left radial basilic arteriovenous fistula cannulation under ultrasound guidance 2.   Left arm fistulagram including central venogram 3.   Percutaneous transluminal angioplasty of perianastomotic basilic vein and forearm basilic vein with 6 mm diameter by 8 cm length Lutonix drug-coated angioplasty balloon  PRE-OPERATIVE DIAGNOSIS: 1. ESRD 2. Poorly functional left radial basilic AVF  POST-OPERATIVE DIAGNOSIS: same as above   SURGEON: Leotis Pain, MD  ANESTHESIA: local with MCS  ESTIMATED BLOOD LOSS: Minimal  FINDING(S): 1. Significant irregularity and moderate stenosis in the 60-70% range in the basilic vein near the anastomosis to the radial artery in the distal forearm. Aneurysmal arterial and venous access sites. The remainder of the fistula was widely patent as was the central venous circulation.  SPECIMEN(S):  None  CONTRAST: 25 cc  FLUORO TIME: 1.1 minutes  MODERATE CONSCIOUS SEDATION TIME: Approximately 20 minutes with 3 mg of Versed and 100 mcg of Fentanyl   INDICATIONS: Daryl Weeks is a 56 y.o. male who presents with malfunctioning left radial basilic arteriovenous fistula.  The patient is scheduled for left arm fistulagram.  The patient is aware the risks include but are not limited to: bleeding, infection, thrombosis of the cannulated access, and possible anaphylactic reaction to the contrast.  The patient is aware of the risks of the procedure and elects to proceed forward.  DESCRIPTION: After full informed written consent was obtained, the patient was brought back to the angiography suite and placed supine upon the angiography table.  The patient was connected to monitoring equipment. Moderate conscious sedation was administered with a face to face encounter with the patient throughout the procedure with my supervision of the RN administering medicines and monitoring the patient's vital signs and  mental status throughout from the start of the procedure until the patient was taken to the recovery room. The left arm was prepped and draped in the standard fashion for a percutaneous access intervention.  Under ultrasound guidance, the left radial basilic arteriovenous fistula was cannulated with a micropuncture needle under direct ultrasound guidance in a retrograde fashion in the proximal forearm and a permanent image was performed.  The microwire was advanced into the fistula and the needle was exchanged for the a microsheath.  I then upsized to a 6 Fr Sheath and imaging was performed. A Kumpe catheter was placed over a Magic torque wire to the arterial anastomosis of the radial artery and the basilic vein.  Hand injections were completed to image the access including the central venous system. This demonstrated significant irregularity and moderate stenosis in the 60-70% range in the basilic vein near the anastomosis to the radial artery in the distal forearm. Aneurysmal arterial and venous access sites. The remainder of the fistula was widely patent as was the central venous circulation..  Based on the images, this patient will need intervention to the vein near the anastomosis and in the distal forearm. I then gave the patient 3000 units of intravenous heparin.  I then crossed the stenosis with a Magic Tourqe wire.  Based on the imaging, a 6 mm x 8 cm  Lutonix drug-coated angioplasty balloon was selected.  The balloon was centered around the perianastomotic basilic vein stenosis and inflated to 12 ATM for 1 minute(s).  On completion imaging, a 20 % residual stenosis was present.     Based on the completion imaging, no further intervention is necessary.  The wire and balloon were  removed from the sheath.  A 4-0 Monocryl purse-string suture was sewn around the sheath.  The sheath was removed while tying down the suture.  A sterile bandage was applied to the puncture site.  COMPLICATIONS:  None  CONDITION: Stable   Leotis Pain  10/28/2016 10:21 AM   This note was created with Dragon Medical transcription system. Any errors in dictation are purely unintentional.

## 2016-10-28 NOTE — H&P (Signed)
[facility] Admission History & Physical  MRN : [id number]  Daryl Weeks is a 56 y.o. (1961-04-07) male who presents with chief complaint of No chief complaint on file. Daryl Weeks  History of Present Illness: I am asked to evaluate the patient by the dialysis center. The patient was sent here because they were unable to achieve adequate dialysis this week. The patient states there there have been increasing problems with the access, such as low flow and prolonged bleeding after decannulation. The patient estimates these problems have been going on for 1-2 weeks. The patient is unaware of any other change.  Patient denies pain or tenderness overlying the access.  There is no pain with dialysis.  The patient denies hand pain or finger pain consistent with steal syndrome.   There have not been any recent interventions or declots of this access although he has previously been treated for steal.  The patient is not chronically hypotensive on dialysis.  Current Facility-Administered Medications  Medication Dose Route Frequency Provider Last Rate Last Dose  . 0.9 %  sodium chloride infusion   Intravenous Continuous Daryl Huxley, MD      . ceFAZolin (ANCEF) IVPB 1 g/50 mL premix  1 g Intravenous Once Daryl Huxley, MD        Past Medical History:  Diagnosis Date  . Anemia   . Arthritis   . CHF (congestive heart failure) ([place])   . Colon polyps   . COPD (chronic obstructive pulmonary disease) ([place])   . Coronary artery disease    Non-ST elevation myocardial infarction in February of 2015. In the setting of hypertensive urgency and blood loss anemia. Cardiac catheterization showed significant three-vessel coronary artery disease. EF was 55% by echo. He underwent CABG at [facility] on May 19 with LIMA to LAD, Sequential SVG to OM1 and OM2, and SVG to RCA  . End stage renal disease ([place])    m-w-f   -Daryl Weeks  . ETOH abuse   . GERD (gastroesophageal reflux disease)    hx  .  Hyperlipidemia   . Hypertension   . IgA nephropathy   . Lower GI bleed    Due to colon polyps. Status post resection of 14 polyps  . Non-ST elevation MI (NSTEMI) ([place])   . Pneumonia    hx  . Shortness of breath   . Tobacco abuse   . Tobacco abuse     Past Surgical History:  Procedure Laterality Date  . AV FISTULA PLACEMENT    . CARDIAC CATHETERIZATION     RCA 90% and calcified mid LAD 80% Stenosis  . CORONARY ARTERY BYPASS GRAFT N/A 11/29/2013   Procedure: CORONARY ARTERY BYPASS GRAFTING (CABG) x 4 using endoscopically harvested right saphenous vein and left internal mammary artery;  Surgeon: Daryl Pollack, MD;  Location: [facility];  Service: Open Heart Surgery;  Laterality: N/A;  . dialysis catheterr  2/15  . INTRAOPERATIVE TRANSESOPHAGEAL ECHOCARDIOGRAM N/A 11/29/2013   Procedure: INTRAOPERATIVE TRANSESOPHAGEAL ECHOCARDIOGRAM;  Surgeon: Daryl Pollack, MD;  Location: [facility];  Service: Open Heart Surgery;  Laterality: N/A;  . PERIPHERAL VASCULAR CATHETERIZATION N/A 06/12/2015   Procedure: A/V Shuntogram/Fistulagram;  Surgeon: Daryl Huxley, MD;  Location: [facility];  Service: Cardiovascular;  Laterality: N/A;  . PERIPHERAL VASCULAR CATHETERIZATION Left 06/12/2015   Procedure: A/V Shunt Intervention;  Surgeon: Daryl Huxley, MD;  Location: [facility];  Service: Cardiovascular;  Laterality: Left;  . RENAL BIOPSY Left 14  Social History Social History  Substance Use Topics  . Smoking status: Current Every Day Smoker    Packs/day: 0.50    Years: 30.00    Types: Cigarettes  . Smokeless tobacco: Never Used  . Alcohol use 1.2 oz/week    2 Cans of beer per week     Comment:  less now    Family History Family History  Problem Relation Age of Onset  . Heart disease Father   . Heart disease Brother     No family history of bleeding or clotting disorders, autoimmune disease or porphyria  No Known Allergies   REVIEW OF SYSTEMS (Negative unless  checked)  Constitutional: [] Weight loss  [] Fever  [] Chills Cardiac: [] Chest pain   [] Chest pressure   [] Palpitations   [] Shortness of breath when laying flat   [] Shortness of breath at rest   [x] Shortness of breath with exertion. Vascular:  [] Pain in legs with walking   [] Pain in legs at rest   [] Pain in legs when laying flat   [] Claudication   [] Pain in feet when walking  [] Pain in feet at rest  [] Pain in feet when laying flat   [] History of DVT   [] Phlebitis   [] Swelling in legs   [] Varicose veins   [] Non-healing ulcers Pulmonary:   [] Uses home oxygen   [] Productive cough   [] Hemoptysis   [] Wheeze  [] COPD   [] Asthma Neurologic:  [] Dizziness  [] Blackouts   [] Seizures   [] History of stroke   [] History of TIA  [] Aphasia   [] Temporary blindness   [] Dysphagia   [] Weakness or numbness in arms   [] Weakness or numbness in legs Musculoskeletal:  [] Arthritis   [] Joint swelling   [] Joint pain   [] Low back pain Hematologic:  [] Easy bruising  [] Easy bleeding   [] Hypercoagulable state   [] Anemic  [] Hepatitis Gastrointestinal:  [] Blood in stool   [] Vomiting blood  [] Gastroesophageal reflux/heartburn   [] Difficulty swallowing. Genitourinary:  [x] Chronic kidney disease   [] Difficult urination  [] Frequent urination  [] Burning with urination   [] Blood in urine Skin:  [] Rashes   [] Ulcers   [] Wounds Psychological:  [] History of anxiety   []  History of major depression.  Physical Examination  Vitals:   10/28/16 0837  BP: (!) 156/99  Pulse: 90  Resp: 16  SpO2: 95%  Weight: 73.9 kg (163 lb)  Height: 5\' 8"  (1.727 m)   Body mass index is 24.78 kg/m. Gen: WD/WN, NAD Head: [place]/AT, No temporalis wasting. Prominent temp pulse not noted. Ear/Nose/Throat: Hearing grossly intact, nares w/o erythema or drainage, oropharynx w/o Erythema/Exudate,  Eyes: Conjunctiva clear, sclera non-icteric Neck: Trachea midline.  No JVD.  Pulmonary:  Good air movement, respirations not labored, no use of accessory muscles.  Cardiac:  RRR, normal S1, S2. Vascular: thrill is present in left arm AVF Vessel Right Left  Radial Palpable Palpable  Ulnar Not Palpable Not Palpable  Brachial Palpable Palpable  Carotid Palpable, without bruit Palpable, without bruit  Gastrointestinal: soft, non-tender/non-distended. No guarding/reflex.  Musculoskeletal: M/S 5/5 throughout.  Extremities without ischemic changes.  No deformity or atrophy.  Neurologic: Sensation grossly intact in extremities.  Symmetrical.  Speech is fluent. Motor exam as listed above. Psychiatric: Judgment intact, Mood & affect appropriate for pt's clinical situation. Dermatologic: No rashes or ulcers noted.  No cellulitis or open wounds. Lymph : No Cervical, Axillary, or Inguinal lymphadenopathy.   CBC Lab Results  Component Value Date   WBC 8.4 07/11/2014   HGB 13.7 07/11/2014   HCT 42.2 07/11/2014  MCV 109 (H) 07/11/2014   PLT 223 07/11/2014    BMET    Component Value Date/Time   NA 132 (L) 06/24/2014 0903   K 4.8 07/11/2014 1046   CL 96 (L) 06/24/2014 0903   CO2 21 06/24/2014 0903   GLUCOSE 92 06/24/2014 0903   BUN 60 (H) 06/24/2014 0903   CREATININE 9.10 (H) 06/24/2014 0903   CALCIUM 8.8 06/24/2014 0903   GFRNONAA 7 (L) 06/24/2014 0903   GFRNONAA 11 (L) 01/14/2014 1051   GFRAA 8 (L) 06/24/2014 0903   GFRAA 12 (L) 01/14/2014 1051   CrCl cannot be calculated (Patient's most recent lab result is older than the maximum 21 days allowed.).  COAG Lab Results  Component Value Date   INR 1.31 11/29/2013   INR 0.99 11/27/2013   INR 1.0 10/30/2013    Radiology No results found.  Assessment/Plan 1.  Complication dialysis device with low flow in AV access:  Patient's left arm dialysis access is malfunctioning per the dialysis center. The patient will undergo angiography and correction of any problems using interventional techniques with the hope of restoring function to the access.  The risks and benefits were described to the patient.  All  questions were answered.  The patient agrees to proceed with angiography and intervention. Potassium will be drawn to ensure that it is an appropriate level prior to performing intervention. 2.  End-stage renal disease requiring hemodialysis:  Patient will continue dialysis therapy without further interruption. If a successful intervention is not achieved then a tunneled catheter will be placed. Dialysis has already been arranged. 3.  Hypertension:  Patient will continue medical management; nephrology is following no changes in oral medications. 4. Hyperlipidemia: lipid control important in reducing the progression of atherosclerotic disease. Continue statin therapy     Leotis Pain, MD  10/28/2016 9:45 AM

## 2016-10-28 NOTE — Discharge Instructions (Signed)
Fistulogram, Care After °Introduction °Refer to this sheet in the next few weeks. These instructions provide you with information on caring for yourself after your procedure. Your health care provider may also give you more specific instructions. Your treatment has been planned according to current medical practices, but problems sometimes occur. Call your health care provider if you have any problems or questions after your procedure. °What can I expect after the procedure? °After your procedure, it is typical to have the following: °· A small amount of discomfort in the area where the catheters were placed. °· A small amount of bruising around the fistula. °· Sleepiness and fatigue. °Follow these instructions at home: °· Rest at home for the day following your procedure. °· Do not drive or operate heavy machinery while taking pain medicine. °· Take medicines only as directed by your health care provider. °· Do not take baths, swim, or use a hot tub until your health care provider approves. You may shower 24 hours after the procedure or as directed by your health care provider. °· There are many different ways to close and cover an incision, including stitches, skin glue, and adhesive strips. Follow your health care provider's instructions on: °¨ Incision care. °¨ Bandage (dressing) changes and removal. °¨ Incision closure removal. °· Monitor your dialysis fistula carefully. °Contact a health care provider if: °· You have drainage, redness, swelling, or pain at your catheter site. °· You have a fever. °· You have chills. °Get help right away if: °· You feel weak. °· You have trouble balancing. °· You have trouble moving your arms or legs. °· You have problems with your speech or vision. °· You can no longer feel a vibration or buzz when you put your fingers over your dialysis fistula. °· The limb that was used for the procedure: °¨ Swells. °¨ Is painful. °¨ Is cold. °¨ Is discolored, such as blue or pale  white. °This information is not intended to replace advice given to you by your health care provider. Make sure you discuss any questions you have with your health care provider. °Document Released: 01/14/2014 Document Revised: 02/05/2016 Document Reviewed: 10/19/2013 °© 2017 Elsevier ° °

## 2016-10-29 ENCOUNTER — Encounter: Payer: Self-pay | Admitting: Vascular Surgery

## 2016-10-29 DIAGNOSIS — N2581 Secondary hyperparathyroidism of renal origin: Secondary | ICD-10-CM | POA: Diagnosis not present

## 2016-10-29 DIAGNOSIS — D631 Anemia in chronic kidney disease: Secondary | ICD-10-CM | POA: Diagnosis not present

## 2016-10-29 DIAGNOSIS — D509 Iron deficiency anemia, unspecified: Secondary | ICD-10-CM | POA: Diagnosis not present

## 2016-10-29 DIAGNOSIS — N186 End stage renal disease: Secondary | ICD-10-CM | POA: Diagnosis not present

## 2016-10-29 DIAGNOSIS — Z992 Dependence on renal dialysis: Secondary | ICD-10-CM | POA: Diagnosis not present

## 2016-10-29 DIAGNOSIS — E8779 Other fluid overload: Secondary | ICD-10-CM | POA: Diagnosis not present

## 2016-11-01 DIAGNOSIS — N2581 Secondary hyperparathyroidism of renal origin: Secondary | ICD-10-CM | POA: Diagnosis not present

## 2016-11-01 DIAGNOSIS — N186 End stage renal disease: Secondary | ICD-10-CM | POA: Diagnosis not present

## 2016-11-01 DIAGNOSIS — D509 Iron deficiency anemia, unspecified: Secondary | ICD-10-CM | POA: Diagnosis not present

## 2016-11-01 DIAGNOSIS — E8779 Other fluid overload: Secondary | ICD-10-CM | POA: Diagnosis not present

## 2016-11-01 DIAGNOSIS — Z992 Dependence on renal dialysis: Secondary | ICD-10-CM | POA: Diagnosis not present

## 2016-11-01 DIAGNOSIS — D631 Anemia in chronic kidney disease: Secondary | ICD-10-CM | POA: Diagnosis not present

## 2016-11-03 DIAGNOSIS — E8779 Other fluid overload: Secondary | ICD-10-CM | POA: Diagnosis not present

## 2016-11-03 DIAGNOSIS — N2581 Secondary hyperparathyroidism of renal origin: Secondary | ICD-10-CM | POA: Diagnosis not present

## 2016-11-03 DIAGNOSIS — Z992 Dependence on renal dialysis: Secondary | ICD-10-CM | POA: Diagnosis not present

## 2016-11-03 DIAGNOSIS — N186 End stage renal disease: Secondary | ICD-10-CM | POA: Diagnosis not present

## 2016-11-03 DIAGNOSIS — D631 Anemia in chronic kidney disease: Secondary | ICD-10-CM | POA: Diagnosis not present

## 2016-11-03 DIAGNOSIS — D509 Iron deficiency anemia, unspecified: Secondary | ICD-10-CM | POA: Diagnosis not present

## 2016-11-05 DIAGNOSIS — N2581 Secondary hyperparathyroidism of renal origin: Secondary | ICD-10-CM | POA: Diagnosis not present

## 2016-11-05 DIAGNOSIS — N186 End stage renal disease: Secondary | ICD-10-CM | POA: Diagnosis not present

## 2016-11-05 DIAGNOSIS — D631 Anemia in chronic kidney disease: Secondary | ICD-10-CM | POA: Diagnosis not present

## 2016-11-05 DIAGNOSIS — E8779 Other fluid overload: Secondary | ICD-10-CM | POA: Diagnosis not present

## 2016-11-05 DIAGNOSIS — Z992 Dependence on renal dialysis: Secondary | ICD-10-CM | POA: Diagnosis not present

## 2016-11-05 DIAGNOSIS — D509 Iron deficiency anemia, unspecified: Secondary | ICD-10-CM | POA: Diagnosis not present

## 2016-11-08 DIAGNOSIS — N186 End stage renal disease: Secondary | ICD-10-CM | POA: Diagnosis not present

## 2016-11-08 DIAGNOSIS — D631 Anemia in chronic kidney disease: Secondary | ICD-10-CM | POA: Diagnosis not present

## 2016-11-08 DIAGNOSIS — N2581 Secondary hyperparathyroidism of renal origin: Secondary | ICD-10-CM | POA: Diagnosis not present

## 2016-11-08 DIAGNOSIS — Z992 Dependence on renal dialysis: Secondary | ICD-10-CM | POA: Diagnosis not present

## 2016-11-08 DIAGNOSIS — E8779 Other fluid overload: Secondary | ICD-10-CM | POA: Diagnosis not present

## 2016-11-08 DIAGNOSIS — D509 Iron deficiency anemia, unspecified: Secondary | ICD-10-CM | POA: Diagnosis not present

## 2016-11-10 DIAGNOSIS — N186 End stage renal disease: Secondary | ICD-10-CM | POA: Diagnosis not present

## 2016-11-10 DIAGNOSIS — Z992 Dependence on renal dialysis: Secondary | ICD-10-CM | POA: Diagnosis not present

## 2016-11-10 DIAGNOSIS — D509 Iron deficiency anemia, unspecified: Secondary | ICD-10-CM | POA: Diagnosis not present

## 2016-11-10 DIAGNOSIS — D631 Anemia in chronic kidney disease: Secondary | ICD-10-CM | POA: Diagnosis not present

## 2016-11-10 DIAGNOSIS — E8779 Other fluid overload: Secondary | ICD-10-CM | POA: Diagnosis not present

## 2016-11-10 DIAGNOSIS — N2581 Secondary hyperparathyroidism of renal origin: Secondary | ICD-10-CM | POA: Diagnosis not present

## 2016-11-11 DIAGNOSIS — D509 Iron deficiency anemia, unspecified: Secondary | ICD-10-CM | POA: Diagnosis not present

## 2016-11-11 DIAGNOSIS — N186 End stage renal disease: Secondary | ICD-10-CM | POA: Diagnosis not present

## 2016-11-11 DIAGNOSIS — Z992 Dependence on renal dialysis: Secondary | ICD-10-CM | POA: Diagnosis not present

## 2016-11-11 DIAGNOSIS — N2581 Secondary hyperparathyroidism of renal origin: Secondary | ICD-10-CM | POA: Diagnosis not present

## 2016-11-11 DIAGNOSIS — D631 Anemia in chronic kidney disease: Secondary | ICD-10-CM | POA: Diagnosis not present

## 2016-11-12 DIAGNOSIS — Z992 Dependence on renal dialysis: Secondary | ICD-10-CM | POA: Diagnosis not present

## 2016-11-12 DIAGNOSIS — N186 End stage renal disease: Secondary | ICD-10-CM | POA: Diagnosis not present

## 2016-11-12 DIAGNOSIS — N2581 Secondary hyperparathyroidism of renal origin: Secondary | ICD-10-CM | POA: Diagnosis not present

## 2016-11-12 DIAGNOSIS — D631 Anemia in chronic kidney disease: Secondary | ICD-10-CM | POA: Diagnosis not present

## 2016-11-12 DIAGNOSIS — D509 Iron deficiency anemia, unspecified: Secondary | ICD-10-CM | POA: Diagnosis not present

## 2016-11-15 DIAGNOSIS — D509 Iron deficiency anemia, unspecified: Secondary | ICD-10-CM | POA: Diagnosis not present

## 2016-11-15 DIAGNOSIS — Z992 Dependence on renal dialysis: Secondary | ICD-10-CM | POA: Diagnosis not present

## 2016-11-15 DIAGNOSIS — D631 Anemia in chronic kidney disease: Secondary | ICD-10-CM | POA: Diagnosis not present

## 2016-11-15 DIAGNOSIS — N186 End stage renal disease: Secondary | ICD-10-CM | POA: Diagnosis not present

## 2016-11-15 DIAGNOSIS — N2581 Secondary hyperparathyroidism of renal origin: Secondary | ICD-10-CM | POA: Diagnosis not present

## 2016-11-17 DIAGNOSIS — N2581 Secondary hyperparathyroidism of renal origin: Secondary | ICD-10-CM | POA: Diagnosis not present

## 2016-11-17 DIAGNOSIS — D509 Iron deficiency anemia, unspecified: Secondary | ICD-10-CM | POA: Diagnosis not present

## 2016-11-17 DIAGNOSIS — N186 End stage renal disease: Secondary | ICD-10-CM | POA: Diagnosis not present

## 2016-11-17 DIAGNOSIS — Z992 Dependence on renal dialysis: Secondary | ICD-10-CM | POA: Diagnosis not present

## 2016-11-17 DIAGNOSIS — D631 Anemia in chronic kidney disease: Secondary | ICD-10-CM | POA: Diagnosis not present

## 2016-11-19 DIAGNOSIS — Z992 Dependence on renal dialysis: Secondary | ICD-10-CM | POA: Diagnosis not present

## 2016-11-19 DIAGNOSIS — D509 Iron deficiency anemia, unspecified: Secondary | ICD-10-CM | POA: Diagnosis not present

## 2016-11-19 DIAGNOSIS — N2581 Secondary hyperparathyroidism of renal origin: Secondary | ICD-10-CM | POA: Diagnosis not present

## 2016-11-19 DIAGNOSIS — N186 End stage renal disease: Secondary | ICD-10-CM | POA: Diagnosis not present

## 2016-11-19 DIAGNOSIS — D631 Anemia in chronic kidney disease: Secondary | ICD-10-CM | POA: Diagnosis not present

## 2016-11-22 DIAGNOSIS — N2581 Secondary hyperparathyroidism of renal origin: Secondary | ICD-10-CM | POA: Diagnosis not present

## 2016-11-22 DIAGNOSIS — N186 End stage renal disease: Secondary | ICD-10-CM | POA: Diagnosis not present

## 2016-11-22 DIAGNOSIS — D509 Iron deficiency anemia, unspecified: Secondary | ICD-10-CM | POA: Diagnosis not present

## 2016-11-22 DIAGNOSIS — Z992 Dependence on renal dialysis: Secondary | ICD-10-CM | POA: Diagnosis not present

## 2016-11-22 DIAGNOSIS — D631 Anemia in chronic kidney disease: Secondary | ICD-10-CM | POA: Diagnosis not present

## 2016-11-24 DIAGNOSIS — N2581 Secondary hyperparathyroidism of renal origin: Secondary | ICD-10-CM | POA: Diagnosis not present

## 2016-11-24 DIAGNOSIS — N186 End stage renal disease: Secondary | ICD-10-CM | POA: Diagnosis not present

## 2016-11-24 DIAGNOSIS — Z992 Dependence on renal dialysis: Secondary | ICD-10-CM | POA: Diagnosis not present

## 2016-11-24 DIAGNOSIS — D509 Iron deficiency anemia, unspecified: Secondary | ICD-10-CM | POA: Diagnosis not present

## 2016-11-24 DIAGNOSIS — D631 Anemia in chronic kidney disease: Secondary | ICD-10-CM | POA: Diagnosis not present

## 2016-11-26 DIAGNOSIS — N186 End stage renal disease: Secondary | ICD-10-CM | POA: Diagnosis not present

## 2016-11-26 DIAGNOSIS — N2581 Secondary hyperparathyroidism of renal origin: Secondary | ICD-10-CM | POA: Diagnosis not present

## 2016-11-26 DIAGNOSIS — D509 Iron deficiency anemia, unspecified: Secondary | ICD-10-CM | POA: Diagnosis not present

## 2016-11-26 DIAGNOSIS — Z992 Dependence on renal dialysis: Secondary | ICD-10-CM | POA: Diagnosis not present

## 2016-11-26 DIAGNOSIS — D631 Anemia in chronic kidney disease: Secondary | ICD-10-CM | POA: Diagnosis not present

## 2016-11-29 DIAGNOSIS — D509 Iron deficiency anemia, unspecified: Secondary | ICD-10-CM | POA: Diagnosis not present

## 2016-11-29 DIAGNOSIS — Z992 Dependence on renal dialysis: Secondary | ICD-10-CM | POA: Diagnosis not present

## 2016-11-29 DIAGNOSIS — D631 Anemia in chronic kidney disease: Secondary | ICD-10-CM | POA: Diagnosis not present

## 2016-11-29 DIAGNOSIS — N2581 Secondary hyperparathyroidism of renal origin: Secondary | ICD-10-CM | POA: Diagnosis not present

## 2016-11-29 DIAGNOSIS — N186 End stage renal disease: Secondary | ICD-10-CM | POA: Diagnosis not present

## 2016-12-01 DIAGNOSIS — D631 Anemia in chronic kidney disease: Secondary | ICD-10-CM | POA: Diagnosis not present

## 2016-12-01 DIAGNOSIS — Z992 Dependence on renal dialysis: Secondary | ICD-10-CM | POA: Diagnosis not present

## 2016-12-01 DIAGNOSIS — D509 Iron deficiency anemia, unspecified: Secondary | ICD-10-CM | POA: Diagnosis not present

## 2016-12-01 DIAGNOSIS — N2581 Secondary hyperparathyroidism of renal origin: Secondary | ICD-10-CM | POA: Diagnosis not present

## 2016-12-01 DIAGNOSIS — N186 End stage renal disease: Secondary | ICD-10-CM | POA: Diagnosis not present

## 2016-12-03 DIAGNOSIS — D509 Iron deficiency anemia, unspecified: Secondary | ICD-10-CM | POA: Diagnosis not present

## 2016-12-03 DIAGNOSIS — Z992 Dependence on renal dialysis: Secondary | ICD-10-CM | POA: Diagnosis not present

## 2016-12-03 DIAGNOSIS — D631 Anemia in chronic kidney disease: Secondary | ICD-10-CM | POA: Diagnosis not present

## 2016-12-03 DIAGNOSIS — N2581 Secondary hyperparathyroidism of renal origin: Secondary | ICD-10-CM | POA: Diagnosis not present

## 2016-12-03 DIAGNOSIS — N186 End stage renal disease: Secondary | ICD-10-CM | POA: Diagnosis not present

## 2016-12-06 DIAGNOSIS — D631 Anemia in chronic kidney disease: Secondary | ICD-10-CM | POA: Diagnosis not present

## 2016-12-06 DIAGNOSIS — Z992 Dependence on renal dialysis: Secondary | ICD-10-CM | POA: Diagnosis not present

## 2016-12-06 DIAGNOSIS — D509 Iron deficiency anemia, unspecified: Secondary | ICD-10-CM | POA: Diagnosis not present

## 2016-12-06 DIAGNOSIS — N2581 Secondary hyperparathyroidism of renal origin: Secondary | ICD-10-CM | POA: Diagnosis not present

## 2016-12-06 DIAGNOSIS — N186 End stage renal disease: Secondary | ICD-10-CM | POA: Diagnosis not present

## 2016-12-08 DIAGNOSIS — D509 Iron deficiency anemia, unspecified: Secondary | ICD-10-CM | POA: Diagnosis not present

## 2016-12-08 DIAGNOSIS — N2581 Secondary hyperparathyroidism of renal origin: Secondary | ICD-10-CM | POA: Diagnosis not present

## 2016-12-08 DIAGNOSIS — D631 Anemia in chronic kidney disease: Secondary | ICD-10-CM | POA: Diagnosis not present

## 2016-12-08 DIAGNOSIS — Z992 Dependence on renal dialysis: Secondary | ICD-10-CM | POA: Diagnosis not present

## 2016-12-08 DIAGNOSIS — N186 End stage renal disease: Secondary | ICD-10-CM | POA: Diagnosis not present

## 2016-12-10 DIAGNOSIS — N2581 Secondary hyperparathyroidism of renal origin: Secondary | ICD-10-CM | POA: Diagnosis not present

## 2016-12-10 DIAGNOSIS — Z992 Dependence on renal dialysis: Secondary | ICD-10-CM | POA: Diagnosis not present

## 2016-12-10 DIAGNOSIS — N186 End stage renal disease: Secondary | ICD-10-CM | POA: Diagnosis not present

## 2016-12-10 DIAGNOSIS — D509 Iron deficiency anemia, unspecified: Secondary | ICD-10-CM | POA: Diagnosis not present

## 2016-12-10 DIAGNOSIS — D631 Anemia in chronic kidney disease: Secondary | ICD-10-CM | POA: Diagnosis not present

## 2016-12-11 DIAGNOSIS — Z992 Dependence on renal dialysis: Secondary | ICD-10-CM | POA: Diagnosis not present

## 2016-12-11 DIAGNOSIS — N186 End stage renal disease: Secondary | ICD-10-CM | POA: Diagnosis not present

## 2016-12-13 DIAGNOSIS — N186 End stage renal disease: Secondary | ICD-10-CM | POA: Diagnosis not present

## 2016-12-13 DIAGNOSIS — D631 Anemia in chronic kidney disease: Secondary | ICD-10-CM | POA: Diagnosis not present

## 2016-12-13 DIAGNOSIS — N2581 Secondary hyperparathyroidism of renal origin: Secondary | ICD-10-CM | POA: Diagnosis not present

## 2016-12-13 DIAGNOSIS — Z992 Dependence on renal dialysis: Secondary | ICD-10-CM | POA: Diagnosis not present

## 2016-12-13 DIAGNOSIS — D509 Iron deficiency anemia, unspecified: Secondary | ICD-10-CM | POA: Diagnosis not present

## 2016-12-15 DIAGNOSIS — N2581 Secondary hyperparathyroidism of renal origin: Secondary | ICD-10-CM | POA: Diagnosis not present

## 2016-12-15 DIAGNOSIS — D631 Anemia in chronic kidney disease: Secondary | ICD-10-CM | POA: Diagnosis not present

## 2016-12-15 DIAGNOSIS — D509 Iron deficiency anemia, unspecified: Secondary | ICD-10-CM | POA: Diagnosis not present

## 2016-12-15 DIAGNOSIS — N186 End stage renal disease: Secondary | ICD-10-CM | POA: Diagnosis not present

## 2016-12-15 DIAGNOSIS — Z992 Dependence on renal dialysis: Secondary | ICD-10-CM | POA: Diagnosis not present

## 2016-12-17 DIAGNOSIS — D631 Anemia in chronic kidney disease: Secondary | ICD-10-CM | POA: Diagnosis not present

## 2016-12-17 DIAGNOSIS — N186 End stage renal disease: Secondary | ICD-10-CM | POA: Diagnosis not present

## 2016-12-17 DIAGNOSIS — Z992 Dependence on renal dialysis: Secondary | ICD-10-CM | POA: Diagnosis not present

## 2016-12-17 DIAGNOSIS — D509 Iron deficiency anemia, unspecified: Secondary | ICD-10-CM | POA: Diagnosis not present

## 2016-12-17 DIAGNOSIS — N2581 Secondary hyperparathyroidism of renal origin: Secondary | ICD-10-CM | POA: Diagnosis not present

## 2016-12-20 DIAGNOSIS — Z992 Dependence on renal dialysis: Secondary | ICD-10-CM | POA: Diagnosis not present

## 2016-12-20 DIAGNOSIS — D631 Anemia in chronic kidney disease: Secondary | ICD-10-CM | POA: Diagnosis not present

## 2016-12-20 DIAGNOSIS — N2581 Secondary hyperparathyroidism of renal origin: Secondary | ICD-10-CM | POA: Diagnosis not present

## 2016-12-20 DIAGNOSIS — D509 Iron deficiency anemia, unspecified: Secondary | ICD-10-CM | POA: Diagnosis not present

## 2016-12-20 DIAGNOSIS — N186 End stage renal disease: Secondary | ICD-10-CM | POA: Diagnosis not present

## 2016-12-21 ENCOUNTER — Ambulatory Visit (INDEPENDENT_AMBULATORY_CARE_PROVIDER_SITE_OTHER): Payer: Medicare Other

## 2016-12-21 ENCOUNTER — Encounter (INDEPENDENT_AMBULATORY_CARE_PROVIDER_SITE_OTHER): Payer: Self-pay | Admitting: Vascular Surgery

## 2016-12-21 ENCOUNTER — Ambulatory Visit (INDEPENDENT_AMBULATORY_CARE_PROVIDER_SITE_OTHER): Payer: Medicare Other | Admitting: Vascular Surgery

## 2016-12-21 ENCOUNTER — Encounter (INDEPENDENT_AMBULATORY_CARE_PROVIDER_SITE_OTHER): Payer: Medicare Other | Admitting: Vascular Surgery

## 2016-12-21 ENCOUNTER — Encounter (INDEPENDENT_AMBULATORY_CARE_PROVIDER_SITE_OTHER): Payer: Medicare Other

## 2016-12-21 VITALS — BP 144/84 | HR 66 | Resp 17 | Wt 164.0 lb

## 2016-12-21 DIAGNOSIS — E785 Hyperlipidemia, unspecified: Secondary | ICD-10-CM | POA: Diagnosis not present

## 2016-12-21 DIAGNOSIS — Z992 Dependence on renal dialysis: Secondary | ICD-10-CM

## 2016-12-21 DIAGNOSIS — I10 Essential (primary) hypertension: Secondary | ICD-10-CM | POA: Diagnosis not present

## 2016-12-21 DIAGNOSIS — N186 End stage renal disease: Secondary | ICD-10-CM

## 2016-12-21 NOTE — Assessment & Plan Note (Signed)
blood pressure control important in reducing the progression of atherosclerotic disease. On appropriate oral medications.  

## 2016-12-21 NOTE — Assessment & Plan Note (Signed)
lipid control important in reducing the progression of atherosclerotic disease. Continue statin therapy  

## 2016-12-21 NOTE — Progress Notes (Signed)
MRN : [id number]  Daryl Weeks is a 56 y.o. (Jan 03, 1961) male who presents with chief complaint of  Chief Complaint  Patient presents with  . Follow-up  .  History of Present Illness: Patient returns today in follow up of Dialysis access. He underwent an intervention for his left radial basilic AV fistula about 6 weeks ago. The fistula is working well since that time. His duplex today shows marked improvement in the velocities in the perianastomotic area after the intervention. He has no other complaints today.  Current Outpatient Prescriptions  Medication Sig Dispense Refill  . amLODipine (NORVASC) 10 MG tablet take 1 tablet by mouth once daily 30 tablet 5  . aspirin EC 81 MG tablet Take 1 tablet (81 mg total) by mouth daily. 90 tablet 3  . atorvastatin (LIPITOR) 20 MG tablet take 1 tablet by mouth at bedtime 30 tablet 6  . cinacalcet (SENSIPAR) 30 MG tablet Take 30 mg by mouth daily.    . furosemide (LASIX) 80 MG tablet Take 80 mg by mouth daily.    . metoprolol (LOPRESSOR) 50 MG tablet take 1 tablet by mouth twice a day 60 tablet 6  . traMADol (ULTRAM) 50 MG tablet Take 1 tablet (50 mg total) by mouth every 6 (six) hours as needed. 40 tablet 0  . traZODone (DESYREL) 50 MG tablet Take 25 mg by mouth See admin instructions. Med is taking on nights pt does not have to be up for dialysis Monday Wed  Friday and Sat nights    . zolpidem (AMBIEN) 5 MG tablet Take 1 tablet (5 mg total) by mouth at bedtime as needed for sleep. (Patient taking differently: Take 5 mg by mouth See admin instructions. Pt takes on nights before dialysis. Lydia Guiles and Thursday nights) 30 tablet 0   Current Facility-Administered Medications  Medication Dose Route Frequency Provider Last Rate Last Dose  . ceFAZolin (ANCEF) IVPB 1 g/50 mL premix  1 g Intravenous Once [name], MD        Past Medical History:  Diagnosis Date  . Anemia   . Arthritis   . CHF (congestive heart failure) ([place])   . Colon polyps     . COPD (chronic obstructive pulmonary disease) ([place])   . Coronary artery disease    Non-ST elevation myocardial infarction in February of 2015. In the setting of hypertensive urgency and blood loss anemia. Cardiac catheterization showed significant three-vessel coronary artery disease. EF was 55% by echo. He underwent CABG at [facility] on May 19 with LIMA to LAD, Sequential SVG to OM1 and OM2, and SVG to RCA  . End stage renal disease ([place])    m-w-f   -davida [place]  . ETOH abuse   . GERD (gastroesophageal reflux disease)    hx  . Hyperlipidemia   . Hypertension   . IgA nephropathy   . Lower GI bleed    Due to colon polyps. Status post resection of 14 polyps  . Non-ST elevation MI (NSTEMI) ([place])   . Pneumonia    hx  . Shortness of breath   . Tobacco abuse   . Tobacco abuse     Past Surgical History:  Procedure Laterality Date  . A/V SHUNT INTERVENTION N/A 10/28/2016   Procedure: A/V Shunt Intervention;  Surgeon: [name], MD;  Location: [place] [facility];  Service: Cardiovascular;  Laterality: N/A;  . A/V SHUNTOGRAM Left 10/28/2016   Procedure: A/V Fistulagram;  Surgeon: [name], MD;  Location: [place] [facility];  Service: Cardiovascular;  Laterality: Left;  . AV FISTULA PLACEMENT    . CARDIAC CATHETERIZATION     RCA 90% and calcified mid LAD 80% Stenosis  . CORONARY ARTERY BYPASS GRAFT N/A 11/29/2013   Procedure: CORONARY ARTERY BYPASS GRAFTING (CABG) x 4 using endoscopically harvested right saphenous vein and left internal mammary artery;  Surgeon: [name], MD;  Location: [place] OR;  Service: Open Heart Surgery;  Laterality: N/A;  . dialysis catheterr  2/15  . INTRAOPERATIVE TRANSESOPHAGEAL ECHOCARDIOGRAM N/A 11/29/2013   Procedure: INTRAOPERATIVE TRANSESOPHAGEAL ECHOCARDIOGRAM;  Surgeon: [name], MD;  Location: [facility] OR;  Service: Open Heart Surgery;  Laterality: N/A;  . PERIPHERAL VASCULAR CATHETERIZATION N/A 06/12/2015   Procedure: A/V Shuntogram/Fistulagram;   Surgeon: [name], MD;  Location: [place] [facility];  Service: Cardiovascular;  Laterality: N/A;  . PERIPHERAL VASCULAR CATHETERIZATION Left 06/12/2015   Procedure: A/V Shunt Intervention;  Surgeon: [name], MD;  Location: [place] [facility];  Service: Cardiovascular;  Laterality: Left;  . RENAL BIOPSY Left 14    Social History Social History  Substance Use Topics  . Smoking status: Current Every Day Smoker    Packs/day: 0.50    Years: 30.00    Types: Cigarettes  . Smokeless tobacco: Never Used  . Alcohol use 1.2 oz/week    2 Cans of beer per week     Comment:  less now    Family History Family History  Problem Relation Age of Onset  . Heart disease Father   . Heart disease Brother     No Known Allergies   REVIEW OF SYSTEMS (Negative unless checked)  Constitutional: [] Weight loss  [] Fever  [] Chills Cardiac: [] Chest pain   [] Chest pressure   [] Palpitations   [] Shortness of breath when laying flat   [] Shortness of breath at rest   [] Shortness of breath with exertion. Vascular:  [] Pain in legs with walking   [] Pain in legs at rest   [] Pain in legs when laying flat   [] Claudication   [] Pain in feet when walking  [] Pain in feet at rest  [] Pain in feet when laying flat   [] History of DVT   [] Phlebitis   [] Swelling in legs   [] Varicose veins   [] Non-healing ulcers Pulmonary:   [] Uses home oxygen   [] Productive cough   [] Hemoptysis   [] Wheeze  [x] COPD   [] Asthma Neurologic:  [] Dizziness  [] Blackouts   [] Seizures   [] History of stroke   [] History of TIA  [] Aphasia   [] Temporary blindness   [] Dysphagia   [] Weakness or numbness in arms   [] Weakness or numbness in legs Musculoskeletal:  [x] Arthritis   [] Joint swelling   [] Joint pain   [] Low back pain Hematologic:  [] Easy bruising  [] Easy bleeding   [] Hypercoagulable state   [x] Anemic   Gastrointestinal:  [] Blood in stool   [] Vomiting blood  [] Gastroesophageal reflux/heartburn   [] Abdominal pain Genitourinary:  [x] Chronic kidney  disease   [] Difficult urination  [] Frequent urination  [] Burning with urination   [] Hematuria Skin:  [] Rashes   [] Ulcers   [] Wounds Psychological:  [] History of anxiety   []  History of major depression.  Physical Examination  BP (!) 144/84   Pulse 66   Resp 17   Wt 164 lb (74.4 kg)   BMI 24.94 kg/m  Gen:  WD/WN, NAD. Appears older than stated age Head: [place]/AT, No temporalis wasting. Ear/Nose/Throat: Hearing grossly intact, nares w/o erythema or drainage, trachea midline Eyes: Conjunctiva  clear. Sclera non-icteric Neck: Supple.  No JVD.  Pulmonary:  Good air movement, no use of accessory muscles.  Cardiac: RRR, normal S1, S2 Vascular: Aneurysmal left forearm AV fistula with good thrill Vessel Right Left  Radial Palpable Palpable                                   Gastrointestinal: soft, non-tender/non-distended Musculoskeletal: M/S 5/5 throughout.  No deformity or atrophy. Neurologic: Sensation grossly intact in extremities.  Symmetrical.  Speech is fluent.  Psychiatric: Judgment intact, Mood & affect appropriate for pt's clinical situation. Dermatologic: No rashes or ulcers noted.  No cellulitis or open wounds.      Labs Recent Results (from the past 2160 hour(s))  Potassium [facility] vascular lab only)     Status: None   Collection Time: 10/28/16  8:53 AM  Result Value Ref Range   Potassium [facility] vascular lab) 4.4 3.5 - 5.1    Radiology No results found.    Assessment/Plan  Hypertension blood pressure control important in reducing the progression of atherosclerotic disease. On appropriate oral medications.   Hyperlipidemia lipid control important in reducing the progression of atherosclerotic disease. Continue statin therapy   ESRD on dialysis [facility]) He underwent an intervention for his left radial basilic AV fistula about 6 weeks ago. The fistula is working well since that time. His duplex today shows marked improvement in the velocities in the  perianastomotic area after the intervention. No intervention currently necessary. Plan to recheck in about 6 months with duplex.    Leotis Pain, MD  12/21/2016 9:38 AM    This note was created with Dragon medical transcription system.  Any errors from dictation are purely unintentional

## 2016-12-21 NOTE — Assessment & Plan Note (Signed)
He underwent an intervention for his left radial basilic AV fistula about 6 weeks ago. The fistula is working well since that time. His duplex today shows marked improvement in the velocities in the perianastomotic area after the intervention. No intervention currently necessary. Plan to recheck in about 6 months with duplex.

## 2016-12-22 DIAGNOSIS — N186 End stage renal disease: Secondary | ICD-10-CM | POA: Diagnosis not present

## 2016-12-22 DIAGNOSIS — D509 Iron deficiency anemia, unspecified: Secondary | ICD-10-CM | POA: Diagnosis not present

## 2016-12-22 DIAGNOSIS — N2581 Secondary hyperparathyroidism of renal origin: Secondary | ICD-10-CM | POA: Diagnosis not present

## 2016-12-22 DIAGNOSIS — D631 Anemia in chronic kidney disease: Secondary | ICD-10-CM | POA: Diagnosis not present

## 2016-12-22 DIAGNOSIS — Z992 Dependence on renal dialysis: Secondary | ICD-10-CM | POA: Diagnosis not present

## 2016-12-24 DIAGNOSIS — D631 Anemia in chronic kidney disease: Secondary | ICD-10-CM | POA: Diagnosis not present

## 2016-12-24 DIAGNOSIS — N186 End stage renal disease: Secondary | ICD-10-CM | POA: Diagnosis not present

## 2016-12-24 DIAGNOSIS — D509 Iron deficiency anemia, unspecified: Secondary | ICD-10-CM | POA: Diagnosis not present

## 2016-12-24 DIAGNOSIS — N2581 Secondary hyperparathyroidism of renal origin: Secondary | ICD-10-CM | POA: Diagnosis not present

## 2016-12-24 DIAGNOSIS — Z992 Dependence on renal dialysis: Secondary | ICD-10-CM | POA: Diagnosis not present

## 2016-12-27 DIAGNOSIS — N2581 Secondary hyperparathyroidism of renal origin: Secondary | ICD-10-CM | POA: Diagnosis not present

## 2016-12-27 DIAGNOSIS — N186 End stage renal disease: Secondary | ICD-10-CM | POA: Diagnosis not present

## 2016-12-27 DIAGNOSIS — Z992 Dependence on renal dialysis: Secondary | ICD-10-CM | POA: Diagnosis not present

## 2016-12-27 DIAGNOSIS — D509 Iron deficiency anemia, unspecified: Secondary | ICD-10-CM | POA: Diagnosis not present

## 2016-12-27 DIAGNOSIS — D631 Anemia in chronic kidney disease: Secondary | ICD-10-CM | POA: Diagnosis not present

## 2016-12-29 DIAGNOSIS — D509 Iron deficiency anemia, unspecified: Secondary | ICD-10-CM | POA: Diagnosis not present

## 2016-12-29 DIAGNOSIS — N186 End stage renal disease: Secondary | ICD-10-CM | POA: Diagnosis not present

## 2016-12-29 DIAGNOSIS — Z992 Dependence on renal dialysis: Secondary | ICD-10-CM | POA: Diagnosis not present

## 2016-12-29 DIAGNOSIS — D631 Anemia in chronic kidney disease: Secondary | ICD-10-CM | POA: Diagnosis not present

## 2016-12-29 DIAGNOSIS — N2581 Secondary hyperparathyroidism of renal origin: Secondary | ICD-10-CM | POA: Diagnosis not present

## 2016-12-31 DIAGNOSIS — Z992 Dependence on renal dialysis: Secondary | ICD-10-CM | POA: Diagnosis not present

## 2016-12-31 DIAGNOSIS — D631 Anemia in chronic kidney disease: Secondary | ICD-10-CM | POA: Diagnosis not present

## 2016-12-31 DIAGNOSIS — D509 Iron deficiency anemia, unspecified: Secondary | ICD-10-CM | POA: Diagnosis not present

## 2016-12-31 DIAGNOSIS — N2581 Secondary hyperparathyroidism of renal origin: Secondary | ICD-10-CM | POA: Diagnosis not present

## 2016-12-31 DIAGNOSIS — N186 End stage renal disease: Secondary | ICD-10-CM | POA: Diagnosis not present

## 2017-01-03 DIAGNOSIS — N186 End stage renal disease: Secondary | ICD-10-CM | POA: Diagnosis not present

## 2017-01-03 DIAGNOSIS — Z992 Dependence on renal dialysis: Secondary | ICD-10-CM | POA: Diagnosis not present

## 2017-01-03 DIAGNOSIS — D631 Anemia in chronic kidney disease: Secondary | ICD-10-CM | POA: Diagnosis not present

## 2017-01-03 DIAGNOSIS — N2581 Secondary hyperparathyroidism of renal origin: Secondary | ICD-10-CM | POA: Diagnosis not present

## 2017-01-03 DIAGNOSIS — D509 Iron deficiency anemia, unspecified: Secondary | ICD-10-CM | POA: Diagnosis not present

## 2017-01-05 DIAGNOSIS — D631 Anemia in chronic kidney disease: Secondary | ICD-10-CM | POA: Diagnosis not present

## 2017-01-05 DIAGNOSIS — N2581 Secondary hyperparathyroidism of renal origin: Secondary | ICD-10-CM | POA: Diagnosis not present

## 2017-01-05 DIAGNOSIS — D509 Iron deficiency anemia, unspecified: Secondary | ICD-10-CM | POA: Diagnosis not present

## 2017-01-05 DIAGNOSIS — N186 End stage renal disease: Secondary | ICD-10-CM | POA: Diagnosis not present

## 2017-01-05 DIAGNOSIS — Z992 Dependence on renal dialysis: Secondary | ICD-10-CM | POA: Diagnosis not present

## 2017-01-07 DIAGNOSIS — Z992 Dependence on renal dialysis: Secondary | ICD-10-CM | POA: Diagnosis not present

## 2017-01-07 DIAGNOSIS — N2581 Secondary hyperparathyroidism of renal origin: Secondary | ICD-10-CM | POA: Diagnosis not present

## 2017-01-07 DIAGNOSIS — N186 End stage renal disease: Secondary | ICD-10-CM | POA: Diagnosis not present

## 2017-01-07 DIAGNOSIS — D631 Anemia in chronic kidney disease: Secondary | ICD-10-CM | POA: Diagnosis not present

## 2017-01-07 DIAGNOSIS — D509 Iron deficiency anemia, unspecified: Secondary | ICD-10-CM | POA: Diagnosis not present

## 2017-01-10 DIAGNOSIS — Z992 Dependence on renal dialysis: Secondary | ICD-10-CM | POA: Diagnosis not present

## 2017-01-10 DIAGNOSIS — N2581 Secondary hyperparathyroidism of renal origin: Secondary | ICD-10-CM | POA: Diagnosis not present

## 2017-01-10 DIAGNOSIS — D631 Anemia in chronic kidney disease: Secondary | ICD-10-CM | POA: Diagnosis not present

## 2017-01-10 DIAGNOSIS — D509 Iron deficiency anemia, unspecified: Secondary | ICD-10-CM | POA: Diagnosis not present

## 2017-01-10 DIAGNOSIS — N186 End stage renal disease: Secondary | ICD-10-CM | POA: Diagnosis not present

## 2017-01-11 DIAGNOSIS — N2581 Secondary hyperparathyroidism of renal origin: Secondary | ICD-10-CM | POA: Diagnosis not present

## 2017-01-11 DIAGNOSIS — Z992 Dependence on renal dialysis: Secondary | ICD-10-CM | POA: Diagnosis not present

## 2017-01-11 DIAGNOSIS — N186 End stage renal disease: Secondary | ICD-10-CM | POA: Diagnosis not present

## 2017-01-11 DIAGNOSIS — D631 Anemia in chronic kidney disease: Secondary | ICD-10-CM | POA: Diagnosis not present

## 2017-01-11 DIAGNOSIS — D509 Iron deficiency anemia, unspecified: Secondary | ICD-10-CM | POA: Diagnosis not present

## 2017-01-12 DIAGNOSIS — D631 Anemia in chronic kidney disease: Secondary | ICD-10-CM | POA: Diagnosis not present

## 2017-01-12 DIAGNOSIS — N186 End stage renal disease: Secondary | ICD-10-CM | POA: Diagnosis not present

## 2017-01-12 DIAGNOSIS — D509 Iron deficiency anemia, unspecified: Secondary | ICD-10-CM | POA: Diagnosis not present

## 2017-01-12 DIAGNOSIS — N2581 Secondary hyperparathyroidism of renal origin: Secondary | ICD-10-CM | POA: Diagnosis not present

## 2017-01-12 DIAGNOSIS — Z992 Dependence on renal dialysis: Secondary | ICD-10-CM | POA: Diagnosis not present

## 2017-01-13 ENCOUNTER — Ambulatory Visit (INDEPENDENT_AMBULATORY_CARE_PROVIDER_SITE_OTHER): Payer: Medicare Other | Admitting: Cardiovascular Disease

## 2017-01-13 ENCOUNTER — Encounter: Payer: Self-pay | Admitting: Cardiovascular Disease

## 2017-01-13 VITALS — BP 122/72 | HR 71 | Ht 65.0 in | Wt 159.0 lb

## 2017-01-13 DIAGNOSIS — E785 Hyperlipidemia, unspecified: Secondary | ICD-10-CM

## 2017-01-13 DIAGNOSIS — I2510 Atherosclerotic heart disease of native coronary artery without angina pectoris: Secondary | ICD-10-CM | POA: Diagnosis not present

## 2017-01-13 DIAGNOSIS — I5032 Chronic diastolic (congestive) heart failure: Secondary | ICD-10-CM

## 2017-01-13 DIAGNOSIS — I10 Essential (primary) hypertension: Secondary | ICD-10-CM

## 2017-01-13 NOTE — Patient Instructions (Signed)
Medication Instructions: Continue same medications.   Labwork: None.   Procedures/Testing: None.   Follow-Up: 6 months with Dr. Ajay Strubel.   Any Additional Special Instructions Will Be Listed Below (If Applicable).     If you need a refill on your cardiac medications before your next appointment, please call your pharmacy.   

## 2017-01-13 NOTE — Progress Notes (Signed)
Cardiology Office Note   Date:  01/13/2017   ID:  Daryl Weeks, DOB 10/02/1960, MRN [id number]  PCP:  Glendon Axe, MD  Cardiologist:   Kathlyn Sacramento, MD   Chief Complaint  Patient presents with  . other    33mo f/u. Pt denies sob, cp. Currently has a cold. Pt recently had shunt intervention 12/2016. Reviewed meds with pt verbally.      History of Present Illness: Daryl Weeks is a 56 y.o. male who presents for a followup visit regarding coronary artery disease.  He is status post CABG in March 2015 after he presented with non-ST elevation myocardial infarction in the setting of severe anemia due to GI bleed.  He has known history of end-stage renal disease currently on dialysis and he is on the transplant list at [facility]. Other medical conditions include chronic diastolic heart failure, hypertension, hyperlipidemia and previous tobacco and alcohol use.  Cardiac workup was done in March 2016 in preparation for kidney transplant. Nuclear stress test was normal. Echocardiogram showed normal LV systolic function with mildly dilated left atrium.  He has been doing well overall with no recent chest pain or shortness of breath. During last visit, I increased amlodipine to 10 mg once daily due to elevated blood pressure. Blood pressure improved after that.   Past Medical History:  Diagnosis Date  . Anemia   . Arthritis   . CHF (congestive heart failure) ([place])   . Colon polyps   . COPD (chronic obstructive pulmonary disease) ([place])   . Coronary artery disease    Non-ST elevation myocardial infarction in February of 2015. In the setting of hypertensive urgency and blood loss anemia. Cardiac catheterization showed significant three-vessel coronary artery disease. EF was 55% by echo. He underwent CABG at [facility] on May 19 with LIMA to LAD, Sequential SVG to OM1 and OM2, and SVG to RCA  . End stage renal disease ([place])    m-w-f   -davida [place]  . ETOH abuse   . GERD (gastroesophageal reflux  disease)    hx  . Hyperlipidemia   . Hypertension   . IgA nephropathy   . Lower GI bleed    Due to colon polyps. Status post resection of 14 polyps  . Non-ST elevation MI (NSTEMI) ([place])   . Pneumonia    hx  . Shortness of breath   . Tobacco abuse   . Tobacco abuse     Past Surgical History:  Procedure Laterality Date  . A/V SHUNT INTERVENTION N/A 10/28/2016   Procedure: A/V Shunt Intervention;  Surgeon: [name], MD;  Location: [place] [facility];  Service: Cardiovascular;  Laterality: N/A;  . A/V SHUNTOGRAM Left 10/28/2016   Procedure: A/V Fistulagram;  Surgeon: [name], MD;  Location: [place] [facility];  Service: Cardiovascular;  Laterality: Left;  . AV FISTULA PLACEMENT    . CARDIAC CATHETERIZATION     RCA 90% and calcified mid LAD 80% Stenosis  . CORONARY ARTERY BYPASS GRAFT N/A 11/29/2013   Procedure: CORONARY ARTERY BYPASS GRAFTING (CABG) x 4 using endoscopically harvested right saphenous vein and left internal mammary artery;  Surgeon: [name], MD;  Location: [place] OR;  Service: Open Heart Surgery;  Laterality: N/A;  . dialysis catheterr  2/15  . INTRAOPERATIVE TRANSESOPHAGEAL ECHOCARDIOGRAM N/A 11/29/2013   Procedure: INTRAOPERATIVE TRANSESOPHAGEAL ECHOCARDIOGRAM;  Surgeon: [name], MD;  Location: [facility] OR;  Service: Open Heart Surgery;  Laterality: N/A;  . PERIPHERAL VASCULAR CATHETERIZATION N/A  06/12/2015   Procedure: A/V Shuntogram/Fistulagram;  Surgeon: [name], MD;  Location: [place] [facility];  Service: Cardiovascular;  Laterality: N/A;  . PERIPHERAL VASCULAR CATHETERIZATION Left 06/12/2015   Procedure: A/V Shunt Intervention;  Surgeon: [name], MD;  Location: [place] [facility];  Service: Cardiovascular;  Laterality: Left;  . RENAL BIOPSY Left 14     Current Outpatient Prescriptions  Medication Sig Dispense Refill  . amLODipine (NORVASC) 10 MG tablet take 1 tablet by mouth once daily 30 tablet 5  . aspirin EC 81 MG tablet Take 1 tablet  (81 mg total) by mouth daily. 90 tablet 3  . atorvastatin (LIPITOR) 20 MG tablet take 1 tablet by mouth at bedtime 30 tablet 6  . cinacalcet (SENSIPAR) 30 MG tablet Take 30 mg by mouth daily.    . furosemide (LASIX) 80 MG tablet Take 80 mg by mouth daily.    . metoprolol (LOPRESSOR) 50 MG tablet take 1 tablet by mouth twice a day 60 tablet 6  . traMADol (ULTRAM) 50 MG tablet Take 1 tablet (50 mg total) by mouth every 6 (six) hours as needed. 40 tablet 0  . traZODone (DESYREL) 50 MG tablet Take 25 mg by mouth See admin instructions. Med is taking on nights pt does not have to be up for dialysis Monday Wed  Friday and Sat nights    . zolpidem (AMBIEN) 10 MG tablet Take 10 mg by mouth at bedtime as needed for sleep.     Current Facility-Administered Medications  Medication Dose Route Frequency Provider Last Rate Last Dose  . ceFAZolin (ANCEF) IVPB 1 g/50 mL premix  1 g Intravenous Once [name], MD        Allergies:   Patient has no known allergies.    Social History:  The patient  reports that he has been smoking Cigarettes.  He has a 15.00 pack-year smoking history. He has never used smokeless tobacco. He reports that he drinks about 1.2 oz of alcohol per week . He reports that he does not use drugs.   Family History:  The patient's family history includes Heart disease in his brother and father.    ROS:  Please see the history of present illness.   Otherwise, review of systems are positive for none.   All other systems are reviewed and negative.    PHYSICAL EXAM: VS:  BP 122/72 (BP Location: Right Arm, Patient Position: Sitting, Cuff Size: Normal)   Pulse 71   Ht 5\' 5"  (1.651 m)   Wt 159 lb (72.1 kg)   BMI 26.46 kg/m  , BMI Body mass index is 26.46 kg/m. GEN: Well nourished, well developed, in no acute distress  HEENT: normal  Neck: no JVD, carotid bruits, or masses Cardiac: RRR; no murmurs, rubs, or gallops,no edema  Respiratory:  clear to auscultation bilaterally, normal  work of breathing GI: soft, nontender, nondistended, + BS MS: no deformity or atrophy  Skin: warm and dry, no rash Neuro:  Strength and sensation are intact Psych: euthymic mood, full affect   EKG:  EKG is ordered today. The ekg ordered today demonstrates Normal sinus rhythm with PVCs. LVH with repolarization abnormalities   Recent Labs: No results found for requested labs within last 8760 hours.    Lipid Panel    Component Value Date/Time   CHOL 143 10/30/2013 0543   TRIG 62 10/30/2013 0543   HDL 65 (H) 10/30/2013 0543   VLDL 12 10/30/2013 0543   LDLCALC  66 10/30/2013 0543      Wt Readings from Last 3 Encounters:  01/13/17 159 lb (72.1 kg)  12/21/16 164 lb (74.4 kg)  10/28/16 163 lb (73.9 kg)       No flowsheet data found.    ASSESSMENT AND PLAN:  1.  Coronary artery disease involving native coronary arteries without angina:  He is doing reasonably well with no anginal symptoms. Continue medical therapy.  2. Chronic diastolic heart failure: He appears to be euvolemic.   3. Hyperlipidemia:Continue treatment with atorvastatin with a target LDL of less than 70. He gets labs done with dialysis.  4. Essential hypertension:   Blood pressure is now well controlled after amlodipine was increased during last visit.  Disposition:   FU with me in 6 months  Signed,  Kathlyn Sacramento, MD  01/13/2017 10:38 AM    [place]

## 2017-01-14 DIAGNOSIS — Z992 Dependence on renal dialysis: Secondary | ICD-10-CM | POA: Diagnosis not present

## 2017-01-14 DIAGNOSIS — N186 End stage renal disease: Secondary | ICD-10-CM | POA: Diagnosis not present

## 2017-01-14 DIAGNOSIS — D509 Iron deficiency anemia, unspecified: Secondary | ICD-10-CM | POA: Diagnosis not present

## 2017-01-14 DIAGNOSIS — D631 Anemia in chronic kidney disease: Secondary | ICD-10-CM | POA: Diagnosis not present

## 2017-01-14 DIAGNOSIS — N2581 Secondary hyperparathyroidism of renal origin: Secondary | ICD-10-CM | POA: Diagnosis not present

## 2017-01-17 DIAGNOSIS — D509 Iron deficiency anemia, unspecified: Secondary | ICD-10-CM | POA: Diagnosis not present

## 2017-01-17 DIAGNOSIS — D631 Anemia in chronic kidney disease: Secondary | ICD-10-CM | POA: Diagnosis not present

## 2017-01-17 DIAGNOSIS — Z992 Dependence on renal dialysis: Secondary | ICD-10-CM | POA: Diagnosis not present

## 2017-01-17 DIAGNOSIS — N2581 Secondary hyperparathyroidism of renal origin: Secondary | ICD-10-CM | POA: Diagnosis not present

## 2017-01-17 DIAGNOSIS — N186 End stage renal disease: Secondary | ICD-10-CM | POA: Diagnosis not present

## 2017-01-19 DIAGNOSIS — D509 Iron deficiency anemia, unspecified: Secondary | ICD-10-CM | POA: Diagnosis not present

## 2017-01-19 DIAGNOSIS — D631 Anemia in chronic kidney disease: Secondary | ICD-10-CM | POA: Diagnosis not present

## 2017-01-19 DIAGNOSIS — N2581 Secondary hyperparathyroidism of renal origin: Secondary | ICD-10-CM | POA: Diagnosis not present

## 2017-01-19 DIAGNOSIS — Z992 Dependence on renal dialysis: Secondary | ICD-10-CM | POA: Diagnosis not present

## 2017-01-19 DIAGNOSIS — N186 End stage renal disease: Secondary | ICD-10-CM | POA: Diagnosis not present

## 2017-01-21 DIAGNOSIS — N186 End stage renal disease: Secondary | ICD-10-CM | POA: Diagnosis not present

## 2017-01-21 DIAGNOSIS — D631 Anemia in chronic kidney disease: Secondary | ICD-10-CM | POA: Diagnosis not present

## 2017-01-21 DIAGNOSIS — D509 Iron deficiency anemia, unspecified: Secondary | ICD-10-CM | POA: Diagnosis not present

## 2017-01-21 DIAGNOSIS — N2581 Secondary hyperparathyroidism of renal origin: Secondary | ICD-10-CM | POA: Diagnosis not present

## 2017-01-21 DIAGNOSIS — Z992 Dependence on renal dialysis: Secondary | ICD-10-CM | POA: Diagnosis not present

## 2017-01-24 DIAGNOSIS — D631 Anemia in chronic kidney disease: Secondary | ICD-10-CM | POA: Diagnosis not present

## 2017-01-24 DIAGNOSIS — D509 Iron deficiency anemia, unspecified: Secondary | ICD-10-CM | POA: Diagnosis not present

## 2017-01-24 DIAGNOSIS — N2581 Secondary hyperparathyroidism of renal origin: Secondary | ICD-10-CM | POA: Diagnosis not present

## 2017-01-24 DIAGNOSIS — N186 End stage renal disease: Secondary | ICD-10-CM | POA: Diagnosis not present

## 2017-01-24 DIAGNOSIS — Z992 Dependence on renal dialysis: Secondary | ICD-10-CM | POA: Diagnosis not present

## 2017-01-26 DIAGNOSIS — N186 End stage renal disease: Secondary | ICD-10-CM | POA: Diagnosis not present

## 2017-01-26 DIAGNOSIS — N2581 Secondary hyperparathyroidism of renal origin: Secondary | ICD-10-CM | POA: Diagnosis not present

## 2017-01-26 DIAGNOSIS — D509 Iron deficiency anemia, unspecified: Secondary | ICD-10-CM | POA: Diagnosis not present

## 2017-01-26 DIAGNOSIS — D631 Anemia in chronic kidney disease: Secondary | ICD-10-CM | POA: Diagnosis not present

## 2017-01-26 DIAGNOSIS — Z992 Dependence on renal dialysis: Secondary | ICD-10-CM | POA: Diagnosis not present

## 2017-01-28 DIAGNOSIS — Z992 Dependence on renal dialysis: Secondary | ICD-10-CM | POA: Diagnosis not present

## 2017-01-28 DIAGNOSIS — D509 Iron deficiency anemia, unspecified: Secondary | ICD-10-CM | POA: Diagnosis not present

## 2017-01-28 DIAGNOSIS — D631 Anemia in chronic kidney disease: Secondary | ICD-10-CM | POA: Diagnosis not present

## 2017-01-28 DIAGNOSIS — N2581 Secondary hyperparathyroidism of renal origin: Secondary | ICD-10-CM | POA: Diagnosis not present

## 2017-01-28 DIAGNOSIS — N186 End stage renal disease: Secondary | ICD-10-CM | POA: Diagnosis not present

## 2017-01-31 DIAGNOSIS — D631 Anemia in chronic kidney disease: Secondary | ICD-10-CM | POA: Diagnosis not present

## 2017-01-31 DIAGNOSIS — Z992 Dependence on renal dialysis: Secondary | ICD-10-CM | POA: Diagnosis not present

## 2017-01-31 DIAGNOSIS — N186 End stage renal disease: Secondary | ICD-10-CM | POA: Diagnosis not present

## 2017-01-31 DIAGNOSIS — D509 Iron deficiency anemia, unspecified: Secondary | ICD-10-CM | POA: Diagnosis not present

## 2017-01-31 DIAGNOSIS — N2581 Secondary hyperparathyroidism of renal origin: Secondary | ICD-10-CM | POA: Diagnosis not present

## 2017-02-02 DIAGNOSIS — D631 Anemia in chronic kidney disease: Secondary | ICD-10-CM | POA: Diagnosis not present

## 2017-02-02 DIAGNOSIS — N186 End stage renal disease: Secondary | ICD-10-CM | POA: Diagnosis not present

## 2017-02-02 DIAGNOSIS — N2581 Secondary hyperparathyroidism of renal origin: Secondary | ICD-10-CM | POA: Diagnosis not present

## 2017-02-02 DIAGNOSIS — D509 Iron deficiency anemia, unspecified: Secondary | ICD-10-CM | POA: Diagnosis not present

## 2017-02-02 DIAGNOSIS — Z992 Dependence on renal dialysis: Secondary | ICD-10-CM | POA: Diagnosis not present

## 2017-02-04 DIAGNOSIS — D509 Iron deficiency anemia, unspecified: Secondary | ICD-10-CM | POA: Diagnosis not present

## 2017-02-04 DIAGNOSIS — D631 Anemia in chronic kidney disease: Secondary | ICD-10-CM | POA: Diagnosis not present

## 2017-02-04 DIAGNOSIS — N186 End stage renal disease: Secondary | ICD-10-CM | POA: Diagnosis not present

## 2017-02-04 DIAGNOSIS — N2581 Secondary hyperparathyroidism of renal origin: Secondary | ICD-10-CM | POA: Diagnosis not present

## 2017-02-04 DIAGNOSIS — Z992 Dependence on renal dialysis: Secondary | ICD-10-CM | POA: Diagnosis not present

## 2017-02-07 DIAGNOSIS — R079 Chest pain, unspecified: Secondary | ICD-10-CM | POA: Diagnosis not present

## 2017-02-07 DIAGNOSIS — D631 Anemia in chronic kidney disease: Secondary | ICD-10-CM | POA: Diagnosis not present

## 2017-02-07 DIAGNOSIS — N2581 Secondary hyperparathyroidism of renal origin: Secondary | ICD-10-CM | POA: Diagnosis not present

## 2017-02-07 DIAGNOSIS — N186 End stage renal disease: Secondary | ICD-10-CM | POA: Diagnosis not present

## 2017-02-07 DIAGNOSIS — Z992 Dependence on renal dialysis: Secondary | ICD-10-CM | POA: Diagnosis not present

## 2017-02-07 DIAGNOSIS — D509 Iron deficiency anemia, unspecified: Secondary | ICD-10-CM | POA: Diagnosis not present

## 2017-02-07 DIAGNOSIS — R05 Cough: Secondary | ICD-10-CM | POA: Diagnosis not present

## 2017-02-09 DIAGNOSIS — D509 Iron deficiency anemia, unspecified: Secondary | ICD-10-CM | POA: Diagnosis not present

## 2017-02-09 DIAGNOSIS — N2581 Secondary hyperparathyroidism of renal origin: Secondary | ICD-10-CM | POA: Diagnosis not present

## 2017-02-09 DIAGNOSIS — N186 End stage renal disease: Secondary | ICD-10-CM | POA: Diagnosis not present

## 2017-02-09 DIAGNOSIS — D631 Anemia in chronic kidney disease: Secondary | ICD-10-CM | POA: Diagnosis not present

## 2017-02-09 DIAGNOSIS — Z992 Dependence on renal dialysis: Secondary | ICD-10-CM | POA: Diagnosis not present

## 2017-02-10 DIAGNOSIS — Z992 Dependence on renal dialysis: Secondary | ICD-10-CM | POA: Diagnosis not present

## 2017-02-10 DIAGNOSIS — N186 End stage renal disease: Secondary | ICD-10-CM | POA: Diagnosis not present

## 2017-02-11 DIAGNOSIS — D509 Iron deficiency anemia, unspecified: Secondary | ICD-10-CM | POA: Diagnosis not present

## 2017-02-11 DIAGNOSIS — Z992 Dependence on renal dialysis: Secondary | ICD-10-CM | POA: Diagnosis not present

## 2017-02-11 DIAGNOSIS — N186 End stage renal disease: Secondary | ICD-10-CM | POA: Diagnosis not present

## 2017-02-11 DIAGNOSIS — N2581 Secondary hyperparathyroidism of renal origin: Secondary | ICD-10-CM | POA: Diagnosis not present

## 2017-02-11 DIAGNOSIS — D631 Anemia in chronic kidney disease: Secondary | ICD-10-CM | POA: Diagnosis not present

## 2017-02-14 DIAGNOSIS — Z992 Dependence on renal dialysis: Secondary | ICD-10-CM | POA: Diagnosis not present

## 2017-02-14 DIAGNOSIS — N2581 Secondary hyperparathyroidism of renal origin: Secondary | ICD-10-CM | POA: Diagnosis not present

## 2017-02-14 DIAGNOSIS — N186 End stage renal disease: Secondary | ICD-10-CM | POA: Diagnosis not present

## 2017-02-14 DIAGNOSIS — D631 Anemia in chronic kidney disease: Secondary | ICD-10-CM | POA: Diagnosis not present

## 2017-02-14 DIAGNOSIS — D509 Iron deficiency anemia, unspecified: Secondary | ICD-10-CM | POA: Diagnosis not present

## 2017-02-16 DIAGNOSIS — D631 Anemia in chronic kidney disease: Secondary | ICD-10-CM | POA: Diagnosis not present

## 2017-02-16 DIAGNOSIS — N186 End stage renal disease: Secondary | ICD-10-CM | POA: Diagnosis not present

## 2017-02-16 DIAGNOSIS — N2581 Secondary hyperparathyroidism of renal origin: Secondary | ICD-10-CM | POA: Diagnosis not present

## 2017-02-16 DIAGNOSIS — D509 Iron deficiency anemia, unspecified: Secondary | ICD-10-CM | POA: Diagnosis not present

## 2017-02-16 DIAGNOSIS — Z992 Dependence on renal dialysis: Secondary | ICD-10-CM | POA: Diagnosis not present

## 2017-02-18 DIAGNOSIS — D631 Anemia in chronic kidney disease: Secondary | ICD-10-CM | POA: Diagnosis not present

## 2017-02-18 DIAGNOSIS — D509 Iron deficiency anemia, unspecified: Secondary | ICD-10-CM | POA: Diagnosis not present

## 2017-02-18 DIAGNOSIS — Z992 Dependence on renal dialysis: Secondary | ICD-10-CM | POA: Diagnosis not present

## 2017-02-18 DIAGNOSIS — N186 End stage renal disease: Secondary | ICD-10-CM | POA: Diagnosis not present

## 2017-02-18 DIAGNOSIS — N2581 Secondary hyperparathyroidism of renal origin: Secondary | ICD-10-CM | POA: Diagnosis not present

## 2017-02-21 DIAGNOSIS — D509 Iron deficiency anemia, unspecified: Secondary | ICD-10-CM | POA: Diagnosis not present

## 2017-02-21 DIAGNOSIS — Z992 Dependence on renal dialysis: Secondary | ICD-10-CM | POA: Diagnosis not present

## 2017-02-21 DIAGNOSIS — N186 End stage renal disease: Secondary | ICD-10-CM | POA: Diagnosis not present

## 2017-02-21 DIAGNOSIS — N2581 Secondary hyperparathyroidism of renal origin: Secondary | ICD-10-CM | POA: Diagnosis not present

## 2017-02-21 DIAGNOSIS — D631 Anemia in chronic kidney disease: Secondary | ICD-10-CM | POA: Diagnosis not present

## 2017-02-23 DIAGNOSIS — D631 Anemia in chronic kidney disease: Secondary | ICD-10-CM | POA: Diagnosis not present

## 2017-02-23 DIAGNOSIS — N186 End stage renal disease: Secondary | ICD-10-CM | POA: Diagnosis not present

## 2017-02-23 DIAGNOSIS — Z992 Dependence on renal dialysis: Secondary | ICD-10-CM | POA: Diagnosis not present

## 2017-02-23 DIAGNOSIS — D509 Iron deficiency anemia, unspecified: Secondary | ICD-10-CM | POA: Diagnosis not present

## 2017-02-23 DIAGNOSIS — N2581 Secondary hyperparathyroidism of renal origin: Secondary | ICD-10-CM | POA: Diagnosis not present

## 2017-02-25 DIAGNOSIS — N186 End stage renal disease: Secondary | ICD-10-CM | POA: Diagnosis not present

## 2017-02-25 DIAGNOSIS — Z992 Dependence on renal dialysis: Secondary | ICD-10-CM | POA: Diagnosis not present

## 2017-02-25 DIAGNOSIS — D509 Iron deficiency anemia, unspecified: Secondary | ICD-10-CM | POA: Diagnosis not present

## 2017-02-25 DIAGNOSIS — D631 Anemia in chronic kidney disease: Secondary | ICD-10-CM | POA: Diagnosis not present

## 2017-02-25 DIAGNOSIS — N2581 Secondary hyperparathyroidism of renal origin: Secondary | ICD-10-CM | POA: Diagnosis not present

## 2017-02-28 DIAGNOSIS — N2581 Secondary hyperparathyroidism of renal origin: Secondary | ICD-10-CM | POA: Diagnosis not present

## 2017-02-28 DIAGNOSIS — D509 Iron deficiency anemia, unspecified: Secondary | ICD-10-CM | POA: Diagnosis not present

## 2017-02-28 DIAGNOSIS — N186 End stage renal disease: Secondary | ICD-10-CM | POA: Diagnosis not present

## 2017-02-28 DIAGNOSIS — Z992 Dependence on renal dialysis: Secondary | ICD-10-CM | POA: Diagnosis not present

## 2017-02-28 DIAGNOSIS — D631 Anemia in chronic kidney disease: Secondary | ICD-10-CM | POA: Diagnosis not present

## 2017-03-02 DIAGNOSIS — D631 Anemia in chronic kidney disease: Secondary | ICD-10-CM | POA: Diagnosis not present

## 2017-03-02 DIAGNOSIS — N2581 Secondary hyperparathyroidism of renal origin: Secondary | ICD-10-CM | POA: Diagnosis not present

## 2017-03-02 DIAGNOSIS — D509 Iron deficiency anemia, unspecified: Secondary | ICD-10-CM | POA: Diagnosis not present

## 2017-03-02 DIAGNOSIS — N186 End stage renal disease: Secondary | ICD-10-CM | POA: Diagnosis not present

## 2017-03-02 DIAGNOSIS — Z992 Dependence on renal dialysis: Secondary | ICD-10-CM | POA: Diagnosis not present

## 2017-03-04 DIAGNOSIS — D509 Iron deficiency anemia, unspecified: Secondary | ICD-10-CM | POA: Diagnosis not present

## 2017-03-04 DIAGNOSIS — N2581 Secondary hyperparathyroidism of renal origin: Secondary | ICD-10-CM | POA: Diagnosis not present

## 2017-03-04 DIAGNOSIS — D631 Anemia in chronic kidney disease: Secondary | ICD-10-CM | POA: Diagnosis not present

## 2017-03-04 DIAGNOSIS — N186 End stage renal disease: Secondary | ICD-10-CM | POA: Diagnosis not present

## 2017-03-04 DIAGNOSIS — Z992 Dependence on renal dialysis: Secondary | ICD-10-CM | POA: Diagnosis not present

## 2017-03-07 DIAGNOSIS — N2581 Secondary hyperparathyroidism of renal origin: Secondary | ICD-10-CM | POA: Diagnosis not present

## 2017-03-07 DIAGNOSIS — D631 Anemia in chronic kidney disease: Secondary | ICD-10-CM | POA: Diagnosis not present

## 2017-03-07 DIAGNOSIS — D509 Iron deficiency anemia, unspecified: Secondary | ICD-10-CM | POA: Diagnosis not present

## 2017-03-07 DIAGNOSIS — N186 End stage renal disease: Secondary | ICD-10-CM | POA: Diagnosis not present

## 2017-03-07 DIAGNOSIS — Z992 Dependence on renal dialysis: Secondary | ICD-10-CM | POA: Diagnosis not present

## 2017-03-09 DIAGNOSIS — Z992 Dependence on renal dialysis: Secondary | ICD-10-CM | POA: Diagnosis not present

## 2017-03-09 DIAGNOSIS — D509 Iron deficiency anemia, unspecified: Secondary | ICD-10-CM | POA: Diagnosis not present

## 2017-03-09 DIAGNOSIS — N186 End stage renal disease: Secondary | ICD-10-CM | POA: Diagnosis not present

## 2017-03-09 DIAGNOSIS — N2581 Secondary hyperparathyroidism of renal origin: Secondary | ICD-10-CM | POA: Diagnosis not present

## 2017-03-09 DIAGNOSIS — D631 Anemia in chronic kidney disease: Secondary | ICD-10-CM | POA: Diagnosis not present

## 2017-03-11 DIAGNOSIS — N186 End stage renal disease: Secondary | ICD-10-CM | POA: Diagnosis not present

## 2017-03-11 DIAGNOSIS — D631 Anemia in chronic kidney disease: Secondary | ICD-10-CM | POA: Diagnosis not present

## 2017-03-11 DIAGNOSIS — D509 Iron deficiency anemia, unspecified: Secondary | ICD-10-CM | POA: Diagnosis not present

## 2017-03-11 DIAGNOSIS — N2581 Secondary hyperparathyroidism of renal origin: Secondary | ICD-10-CM | POA: Diagnosis not present

## 2017-03-11 DIAGNOSIS — Z992 Dependence on renal dialysis: Secondary | ICD-10-CM | POA: Diagnosis not present

## 2017-03-12 DIAGNOSIS — N186 End stage renal disease: Secondary | ICD-10-CM | POA: Diagnosis not present

## 2017-03-12 DIAGNOSIS — Z992 Dependence on renal dialysis: Secondary | ICD-10-CM | POA: Diagnosis not present

## 2017-03-13 DIAGNOSIS — Z992 Dependence on renal dialysis: Secondary | ICD-10-CM | POA: Diagnosis not present

## 2017-03-13 DIAGNOSIS — N2581 Secondary hyperparathyroidism of renal origin: Secondary | ICD-10-CM | POA: Diagnosis not present

## 2017-03-13 DIAGNOSIS — D631 Anemia in chronic kidney disease: Secondary | ICD-10-CM | POA: Diagnosis not present

## 2017-03-13 DIAGNOSIS — N186 End stage renal disease: Secondary | ICD-10-CM | POA: Diagnosis not present

## 2017-03-13 DIAGNOSIS — D509 Iron deficiency anemia, unspecified: Secondary | ICD-10-CM | POA: Diagnosis not present

## 2017-03-14 DIAGNOSIS — Z992 Dependence on renal dialysis: Secondary | ICD-10-CM | POA: Diagnosis not present

## 2017-03-14 DIAGNOSIS — D631 Anemia in chronic kidney disease: Secondary | ICD-10-CM | POA: Diagnosis not present

## 2017-03-14 DIAGNOSIS — N186 End stage renal disease: Secondary | ICD-10-CM | POA: Diagnosis not present

## 2017-03-14 DIAGNOSIS — D509 Iron deficiency anemia, unspecified: Secondary | ICD-10-CM | POA: Diagnosis not present

## 2017-03-14 DIAGNOSIS — N2581 Secondary hyperparathyroidism of renal origin: Secondary | ICD-10-CM | POA: Diagnosis not present

## 2017-03-15 DIAGNOSIS — N186 End stage renal disease: Secondary | ICD-10-CM | POA: Diagnosis not present

## 2017-03-15 DIAGNOSIS — Z992 Dependence on renal dialysis: Secondary | ICD-10-CM | POA: Diagnosis not present

## 2017-03-15 DIAGNOSIS — D631 Anemia in chronic kidney disease: Secondary | ICD-10-CM | POA: Diagnosis not present

## 2017-03-15 DIAGNOSIS — D509 Iron deficiency anemia, unspecified: Secondary | ICD-10-CM | POA: Diagnosis not present

## 2017-03-15 DIAGNOSIS — N2581 Secondary hyperparathyroidism of renal origin: Secondary | ICD-10-CM | POA: Diagnosis not present

## 2017-03-16 DIAGNOSIS — N2581 Secondary hyperparathyroidism of renal origin: Secondary | ICD-10-CM | POA: Diagnosis not present

## 2017-03-16 DIAGNOSIS — N186 End stage renal disease: Secondary | ICD-10-CM | POA: Diagnosis not present

## 2017-03-16 DIAGNOSIS — D509 Iron deficiency anemia, unspecified: Secondary | ICD-10-CM | POA: Diagnosis not present

## 2017-03-16 DIAGNOSIS — Z992 Dependence on renal dialysis: Secondary | ICD-10-CM | POA: Diagnosis not present

## 2017-03-16 DIAGNOSIS — D631 Anemia in chronic kidney disease: Secondary | ICD-10-CM | POA: Diagnosis not present

## 2017-03-18 DIAGNOSIS — D509 Iron deficiency anemia, unspecified: Secondary | ICD-10-CM | POA: Diagnosis not present

## 2017-03-18 DIAGNOSIS — Z992 Dependence on renal dialysis: Secondary | ICD-10-CM | POA: Diagnosis not present

## 2017-03-18 DIAGNOSIS — N186 End stage renal disease: Secondary | ICD-10-CM | POA: Diagnosis not present

## 2017-03-18 DIAGNOSIS — D631 Anemia in chronic kidney disease: Secondary | ICD-10-CM | POA: Diagnosis not present

## 2017-03-18 DIAGNOSIS — N2581 Secondary hyperparathyroidism of renal origin: Secondary | ICD-10-CM | POA: Diagnosis not present

## 2017-03-21 DIAGNOSIS — D509 Iron deficiency anemia, unspecified: Secondary | ICD-10-CM | POA: Diagnosis not present

## 2017-03-21 DIAGNOSIS — D631 Anemia in chronic kidney disease: Secondary | ICD-10-CM | POA: Diagnosis not present

## 2017-03-21 DIAGNOSIS — N186 End stage renal disease: Secondary | ICD-10-CM | POA: Diagnosis not present

## 2017-03-21 DIAGNOSIS — Z992 Dependence on renal dialysis: Secondary | ICD-10-CM | POA: Diagnosis not present

## 2017-03-21 DIAGNOSIS — N2581 Secondary hyperparathyroidism of renal origin: Secondary | ICD-10-CM | POA: Diagnosis not present

## 2017-03-23 DIAGNOSIS — N186 End stage renal disease: Secondary | ICD-10-CM | POA: Diagnosis not present

## 2017-03-23 DIAGNOSIS — Z992 Dependence on renal dialysis: Secondary | ICD-10-CM | POA: Diagnosis not present

## 2017-03-23 DIAGNOSIS — D631 Anemia in chronic kidney disease: Secondary | ICD-10-CM | POA: Diagnosis not present

## 2017-03-23 DIAGNOSIS — D509 Iron deficiency anemia, unspecified: Secondary | ICD-10-CM | POA: Diagnosis not present

## 2017-03-23 DIAGNOSIS — N2581 Secondary hyperparathyroidism of renal origin: Secondary | ICD-10-CM | POA: Diagnosis not present

## 2017-03-25 DIAGNOSIS — Z992 Dependence on renal dialysis: Secondary | ICD-10-CM | POA: Diagnosis not present

## 2017-03-25 DIAGNOSIS — D509 Iron deficiency anemia, unspecified: Secondary | ICD-10-CM | POA: Diagnosis not present

## 2017-03-25 DIAGNOSIS — N2581 Secondary hyperparathyroidism of renal origin: Secondary | ICD-10-CM | POA: Diagnosis not present

## 2017-03-25 DIAGNOSIS — N186 End stage renal disease: Secondary | ICD-10-CM | POA: Diagnosis not present

## 2017-03-25 DIAGNOSIS — D631 Anemia in chronic kidney disease: Secondary | ICD-10-CM | POA: Diagnosis not present

## 2017-03-28 DIAGNOSIS — N2581 Secondary hyperparathyroidism of renal origin: Secondary | ICD-10-CM | POA: Diagnosis not present

## 2017-03-28 DIAGNOSIS — N186 End stage renal disease: Secondary | ICD-10-CM | POA: Diagnosis not present

## 2017-03-28 DIAGNOSIS — D509 Iron deficiency anemia, unspecified: Secondary | ICD-10-CM | POA: Diagnosis not present

## 2017-03-28 DIAGNOSIS — D631 Anemia in chronic kidney disease: Secondary | ICD-10-CM | POA: Diagnosis not present

## 2017-03-28 DIAGNOSIS — Z992 Dependence on renal dialysis: Secondary | ICD-10-CM | POA: Diagnosis not present

## 2017-03-30 DIAGNOSIS — D631 Anemia in chronic kidney disease: Secondary | ICD-10-CM | POA: Diagnosis not present

## 2017-03-30 DIAGNOSIS — N186 End stage renal disease: Secondary | ICD-10-CM | POA: Diagnosis not present

## 2017-03-30 DIAGNOSIS — Z992 Dependence on renal dialysis: Secondary | ICD-10-CM | POA: Diagnosis not present

## 2017-03-30 DIAGNOSIS — D509 Iron deficiency anemia, unspecified: Secondary | ICD-10-CM | POA: Diagnosis not present

## 2017-03-30 DIAGNOSIS — N2581 Secondary hyperparathyroidism of renal origin: Secondary | ICD-10-CM | POA: Diagnosis not present

## 2017-04-01 DIAGNOSIS — N186 End stage renal disease: Secondary | ICD-10-CM | POA: Diagnosis not present

## 2017-04-01 DIAGNOSIS — D509 Iron deficiency anemia, unspecified: Secondary | ICD-10-CM | POA: Diagnosis not present

## 2017-04-01 DIAGNOSIS — D631 Anemia in chronic kidney disease: Secondary | ICD-10-CM | POA: Diagnosis not present

## 2017-04-01 DIAGNOSIS — N2581 Secondary hyperparathyroidism of renal origin: Secondary | ICD-10-CM | POA: Diagnosis not present

## 2017-04-01 DIAGNOSIS — Z992 Dependence on renal dialysis: Secondary | ICD-10-CM | POA: Diagnosis not present

## 2017-04-04 DIAGNOSIS — N2581 Secondary hyperparathyroidism of renal origin: Secondary | ICD-10-CM | POA: Diagnosis not present

## 2017-04-04 DIAGNOSIS — N186 End stage renal disease: Secondary | ICD-10-CM | POA: Diagnosis not present

## 2017-04-04 DIAGNOSIS — D509 Iron deficiency anemia, unspecified: Secondary | ICD-10-CM | POA: Diagnosis not present

## 2017-04-04 DIAGNOSIS — D631 Anemia in chronic kidney disease: Secondary | ICD-10-CM | POA: Diagnosis not present

## 2017-04-04 DIAGNOSIS — Z992 Dependence on renal dialysis: Secondary | ICD-10-CM | POA: Diagnosis not present

## 2017-04-06 DIAGNOSIS — N186 End stage renal disease: Secondary | ICD-10-CM | POA: Diagnosis not present

## 2017-04-06 DIAGNOSIS — N2581 Secondary hyperparathyroidism of renal origin: Secondary | ICD-10-CM | POA: Diagnosis not present

## 2017-04-06 DIAGNOSIS — D631 Anemia in chronic kidney disease: Secondary | ICD-10-CM | POA: Diagnosis not present

## 2017-04-06 DIAGNOSIS — Z992 Dependence on renal dialysis: Secondary | ICD-10-CM | POA: Diagnosis not present

## 2017-04-06 DIAGNOSIS — D509 Iron deficiency anemia, unspecified: Secondary | ICD-10-CM | POA: Diagnosis not present

## 2017-04-07 ENCOUNTER — Encounter (INDEPENDENT_AMBULATORY_CARE_PROVIDER_SITE_OTHER): Payer: Medicare Other

## 2017-04-07 ENCOUNTER — Ambulatory Visit (INDEPENDENT_AMBULATORY_CARE_PROVIDER_SITE_OTHER): Payer: Medicare Other | Admitting: Vascular Surgery

## 2017-04-08 DIAGNOSIS — D631 Anemia in chronic kidney disease: Secondary | ICD-10-CM | POA: Diagnosis not present

## 2017-04-08 DIAGNOSIS — N2581 Secondary hyperparathyroidism of renal origin: Secondary | ICD-10-CM | POA: Diagnosis not present

## 2017-04-08 DIAGNOSIS — Z992 Dependence on renal dialysis: Secondary | ICD-10-CM | POA: Diagnosis not present

## 2017-04-08 DIAGNOSIS — N186 End stage renal disease: Secondary | ICD-10-CM | POA: Diagnosis not present

## 2017-04-08 DIAGNOSIS — D509 Iron deficiency anemia, unspecified: Secondary | ICD-10-CM | POA: Diagnosis not present

## 2017-04-11 DIAGNOSIS — N186 End stage renal disease: Secondary | ICD-10-CM | POA: Diagnosis not present

## 2017-04-11 DIAGNOSIS — D631 Anemia in chronic kidney disease: Secondary | ICD-10-CM | POA: Diagnosis not present

## 2017-04-11 DIAGNOSIS — Z992 Dependence on renal dialysis: Secondary | ICD-10-CM | POA: Diagnosis not present

## 2017-04-11 DIAGNOSIS — D509 Iron deficiency anemia, unspecified: Secondary | ICD-10-CM | POA: Diagnosis not present

## 2017-04-11 DIAGNOSIS — N2581 Secondary hyperparathyroidism of renal origin: Secondary | ICD-10-CM | POA: Diagnosis not present

## 2017-04-12 DIAGNOSIS — Z992 Dependence on renal dialysis: Secondary | ICD-10-CM | POA: Diagnosis not present

## 2017-04-12 DIAGNOSIS — N186 End stage renal disease: Secondary | ICD-10-CM | POA: Diagnosis not present

## 2017-04-13 DIAGNOSIS — N186 End stage renal disease: Secondary | ICD-10-CM | POA: Diagnosis not present

## 2017-04-13 DIAGNOSIS — D631 Anemia in chronic kidney disease: Secondary | ICD-10-CM | POA: Diagnosis not present

## 2017-04-13 DIAGNOSIS — D509 Iron deficiency anemia, unspecified: Secondary | ICD-10-CM | POA: Diagnosis not present

## 2017-04-13 DIAGNOSIS — Z992 Dependence on renal dialysis: Secondary | ICD-10-CM | POA: Diagnosis not present

## 2017-04-13 DIAGNOSIS — N2581 Secondary hyperparathyroidism of renal origin: Secondary | ICD-10-CM | POA: Diagnosis not present

## 2017-04-14 DIAGNOSIS — Z992 Dependence on renal dialysis: Secondary | ICD-10-CM | POA: Diagnosis not present

## 2017-04-14 DIAGNOSIS — N2581 Secondary hyperparathyroidism of renal origin: Secondary | ICD-10-CM | POA: Diagnosis not present

## 2017-04-14 DIAGNOSIS — D509 Iron deficiency anemia, unspecified: Secondary | ICD-10-CM | POA: Diagnosis not present

## 2017-04-14 DIAGNOSIS — D631 Anemia in chronic kidney disease: Secondary | ICD-10-CM | POA: Diagnosis not present

## 2017-04-14 DIAGNOSIS — N186 End stage renal disease: Secondary | ICD-10-CM | POA: Diagnosis not present

## 2017-04-15 DIAGNOSIS — D509 Iron deficiency anemia, unspecified: Secondary | ICD-10-CM | POA: Diagnosis not present

## 2017-04-15 DIAGNOSIS — D631 Anemia in chronic kidney disease: Secondary | ICD-10-CM | POA: Diagnosis not present

## 2017-04-15 DIAGNOSIS — Z992 Dependence on renal dialysis: Secondary | ICD-10-CM | POA: Diagnosis not present

## 2017-04-15 DIAGNOSIS — N2581 Secondary hyperparathyroidism of renal origin: Secondary | ICD-10-CM | POA: Diagnosis not present

## 2017-04-15 DIAGNOSIS — N186 End stage renal disease: Secondary | ICD-10-CM | POA: Diagnosis not present

## 2017-04-18 DIAGNOSIS — N186 End stage renal disease: Secondary | ICD-10-CM | POA: Diagnosis not present

## 2017-04-18 DIAGNOSIS — D631 Anemia in chronic kidney disease: Secondary | ICD-10-CM | POA: Diagnosis not present

## 2017-04-18 DIAGNOSIS — Z992 Dependence on renal dialysis: Secondary | ICD-10-CM | POA: Diagnosis not present

## 2017-04-18 DIAGNOSIS — N2581 Secondary hyperparathyroidism of renal origin: Secondary | ICD-10-CM | POA: Diagnosis not present

## 2017-04-18 DIAGNOSIS — D509 Iron deficiency anemia, unspecified: Secondary | ICD-10-CM | POA: Diagnosis not present

## 2017-04-20 DIAGNOSIS — N186 End stage renal disease: Secondary | ICD-10-CM | POA: Diagnosis not present

## 2017-04-20 DIAGNOSIS — D509 Iron deficiency anemia, unspecified: Secondary | ICD-10-CM | POA: Diagnosis not present

## 2017-04-20 DIAGNOSIS — N2581 Secondary hyperparathyroidism of renal origin: Secondary | ICD-10-CM | POA: Diagnosis not present

## 2017-04-20 DIAGNOSIS — D631 Anemia in chronic kidney disease: Secondary | ICD-10-CM | POA: Diagnosis not present

## 2017-04-20 DIAGNOSIS — Z992 Dependence on renal dialysis: Secondary | ICD-10-CM | POA: Diagnosis not present

## 2017-04-22 DIAGNOSIS — Z992 Dependence on renal dialysis: Secondary | ICD-10-CM | POA: Diagnosis not present

## 2017-04-22 DIAGNOSIS — N186 End stage renal disease: Secondary | ICD-10-CM | POA: Diagnosis not present

## 2017-04-22 DIAGNOSIS — D509 Iron deficiency anemia, unspecified: Secondary | ICD-10-CM | POA: Diagnosis not present

## 2017-04-22 DIAGNOSIS — N2581 Secondary hyperparathyroidism of renal origin: Secondary | ICD-10-CM | POA: Diagnosis not present

## 2017-04-22 DIAGNOSIS — D631 Anemia in chronic kidney disease: Secondary | ICD-10-CM | POA: Diagnosis not present

## 2017-04-25 DIAGNOSIS — D509 Iron deficiency anemia, unspecified: Secondary | ICD-10-CM | POA: Diagnosis not present

## 2017-04-25 DIAGNOSIS — N2581 Secondary hyperparathyroidism of renal origin: Secondary | ICD-10-CM | POA: Diagnosis not present

## 2017-04-25 DIAGNOSIS — D631 Anemia in chronic kidney disease: Secondary | ICD-10-CM | POA: Diagnosis not present

## 2017-04-25 DIAGNOSIS — Z992 Dependence on renal dialysis: Secondary | ICD-10-CM | POA: Diagnosis not present

## 2017-04-25 DIAGNOSIS — N186 End stage renal disease: Secondary | ICD-10-CM | POA: Diagnosis not present

## 2017-04-27 DIAGNOSIS — D631 Anemia in chronic kidney disease: Secondary | ICD-10-CM | POA: Diagnosis not present

## 2017-04-27 DIAGNOSIS — Z992 Dependence on renal dialysis: Secondary | ICD-10-CM | POA: Diagnosis not present

## 2017-04-27 DIAGNOSIS — N186 End stage renal disease: Secondary | ICD-10-CM | POA: Diagnosis not present

## 2017-04-27 DIAGNOSIS — N2581 Secondary hyperparathyroidism of renal origin: Secondary | ICD-10-CM | POA: Diagnosis not present

## 2017-04-27 DIAGNOSIS — D509 Iron deficiency anemia, unspecified: Secondary | ICD-10-CM | POA: Diagnosis not present

## 2017-04-29 DIAGNOSIS — D631 Anemia in chronic kidney disease: Secondary | ICD-10-CM | POA: Diagnosis not present

## 2017-04-29 DIAGNOSIS — D509 Iron deficiency anemia, unspecified: Secondary | ICD-10-CM | POA: Diagnosis not present

## 2017-04-29 DIAGNOSIS — N2581 Secondary hyperparathyroidism of renal origin: Secondary | ICD-10-CM | POA: Diagnosis not present

## 2017-04-29 DIAGNOSIS — Z992 Dependence on renal dialysis: Secondary | ICD-10-CM | POA: Diagnosis not present

## 2017-04-29 DIAGNOSIS — N186 End stage renal disease: Secondary | ICD-10-CM | POA: Diagnosis not present

## 2017-05-02 DIAGNOSIS — N186 End stage renal disease: Secondary | ICD-10-CM | POA: Diagnosis not present

## 2017-05-02 DIAGNOSIS — N2581 Secondary hyperparathyroidism of renal origin: Secondary | ICD-10-CM | POA: Diagnosis not present

## 2017-05-02 DIAGNOSIS — D509 Iron deficiency anemia, unspecified: Secondary | ICD-10-CM | POA: Diagnosis not present

## 2017-05-02 DIAGNOSIS — D631 Anemia in chronic kidney disease: Secondary | ICD-10-CM | POA: Diagnosis not present

## 2017-05-02 DIAGNOSIS — Z992 Dependence on renal dialysis: Secondary | ICD-10-CM | POA: Diagnosis not present

## 2017-05-03 DIAGNOSIS — R2689 Other abnormalities of gait and mobility: Secondary | ICD-10-CM | POA: Diagnosis not present

## 2017-05-03 DIAGNOSIS — H8113 Benign paroxysmal vertigo, bilateral: Secondary | ICD-10-CM | POA: Diagnosis not present

## 2017-05-04 ENCOUNTER — Other Ambulatory Visit: Payer: Self-pay | Admitting: Neurology

## 2017-05-04 DIAGNOSIS — D509 Iron deficiency anemia, unspecified: Secondary | ICD-10-CM | POA: Diagnosis not present

## 2017-05-04 DIAGNOSIS — D631 Anemia in chronic kidney disease: Secondary | ICD-10-CM | POA: Diagnosis not present

## 2017-05-04 DIAGNOSIS — R42 Dizziness and giddiness: Secondary | ICD-10-CM

## 2017-05-04 DIAGNOSIS — Z992 Dependence on renal dialysis: Secondary | ICD-10-CM | POA: Diagnosis not present

## 2017-05-04 DIAGNOSIS — N186 End stage renal disease: Secondary | ICD-10-CM | POA: Diagnosis not present

## 2017-05-04 DIAGNOSIS — N2581 Secondary hyperparathyroidism of renal origin: Secondary | ICD-10-CM | POA: Diagnosis not present

## 2017-05-06 DIAGNOSIS — Z992 Dependence on renal dialysis: Secondary | ICD-10-CM | POA: Diagnosis not present

## 2017-05-06 DIAGNOSIS — D509 Iron deficiency anemia, unspecified: Secondary | ICD-10-CM | POA: Diagnosis not present

## 2017-05-06 DIAGNOSIS — D631 Anemia in chronic kidney disease: Secondary | ICD-10-CM | POA: Diagnosis not present

## 2017-05-06 DIAGNOSIS — N2581 Secondary hyperparathyroidism of renal origin: Secondary | ICD-10-CM | POA: Diagnosis not present

## 2017-05-06 DIAGNOSIS — N186 End stage renal disease: Secondary | ICD-10-CM | POA: Diagnosis not present

## 2017-05-09 ENCOUNTER — Ambulatory Visit: Payer: Medicare Other

## 2017-05-09 DIAGNOSIS — D509 Iron deficiency anemia, unspecified: Secondary | ICD-10-CM | POA: Diagnosis not present

## 2017-05-09 DIAGNOSIS — N186 End stage renal disease: Secondary | ICD-10-CM | POA: Diagnosis not present

## 2017-05-09 DIAGNOSIS — Z992 Dependence on renal dialysis: Secondary | ICD-10-CM | POA: Diagnosis not present

## 2017-05-09 DIAGNOSIS — D631 Anemia in chronic kidney disease: Secondary | ICD-10-CM | POA: Diagnosis not present

## 2017-05-09 DIAGNOSIS — N2581 Secondary hyperparathyroidism of renal origin: Secondary | ICD-10-CM | POA: Diagnosis not present

## 2017-05-11 DIAGNOSIS — Z992 Dependence on renal dialysis: Secondary | ICD-10-CM | POA: Diagnosis not present

## 2017-05-11 DIAGNOSIS — D509 Iron deficiency anemia, unspecified: Secondary | ICD-10-CM | POA: Diagnosis not present

## 2017-05-11 DIAGNOSIS — D631 Anemia in chronic kidney disease: Secondary | ICD-10-CM | POA: Diagnosis not present

## 2017-05-11 DIAGNOSIS — N186 End stage renal disease: Secondary | ICD-10-CM | POA: Diagnosis not present

## 2017-05-11 DIAGNOSIS — N2581 Secondary hyperparathyroidism of renal origin: Secondary | ICD-10-CM | POA: Diagnosis not present

## 2017-05-13 DIAGNOSIS — D631 Anemia in chronic kidney disease: Secondary | ICD-10-CM | POA: Diagnosis not present

## 2017-05-13 DIAGNOSIS — N2581 Secondary hyperparathyroidism of renal origin: Secondary | ICD-10-CM | POA: Diagnosis not present

## 2017-05-13 DIAGNOSIS — N186 End stage renal disease: Secondary | ICD-10-CM | POA: Diagnosis not present

## 2017-05-13 DIAGNOSIS — D509 Iron deficiency anemia, unspecified: Secondary | ICD-10-CM | POA: Diagnosis not present

## 2017-05-13 DIAGNOSIS — Z992 Dependence on renal dialysis: Secondary | ICD-10-CM | POA: Diagnosis not present

## 2017-05-14 DIAGNOSIS — N186 End stage renal disease: Secondary | ICD-10-CM | POA: Diagnosis not present

## 2017-05-14 DIAGNOSIS — D509 Iron deficiency anemia, unspecified: Secondary | ICD-10-CM | POA: Diagnosis not present

## 2017-05-14 DIAGNOSIS — N2581 Secondary hyperparathyroidism of renal origin: Secondary | ICD-10-CM | POA: Diagnosis not present

## 2017-05-14 DIAGNOSIS — Z992 Dependence on renal dialysis: Secondary | ICD-10-CM | POA: Diagnosis not present

## 2017-05-14 DIAGNOSIS — D631 Anemia in chronic kidney disease: Secondary | ICD-10-CM | POA: Diagnosis not present

## 2017-05-16 DIAGNOSIS — D509 Iron deficiency anemia, unspecified: Secondary | ICD-10-CM | POA: Diagnosis not present

## 2017-05-16 DIAGNOSIS — N186 End stage renal disease: Secondary | ICD-10-CM | POA: Diagnosis not present

## 2017-05-16 DIAGNOSIS — D631 Anemia in chronic kidney disease: Secondary | ICD-10-CM | POA: Diagnosis not present

## 2017-05-16 DIAGNOSIS — Z992 Dependence on renal dialysis: Secondary | ICD-10-CM | POA: Diagnosis not present

## 2017-05-16 DIAGNOSIS — N2581 Secondary hyperparathyroidism of renal origin: Secondary | ICD-10-CM | POA: Diagnosis not present

## 2017-05-18 DIAGNOSIS — D631 Anemia in chronic kidney disease: Secondary | ICD-10-CM | POA: Diagnosis not present

## 2017-05-18 DIAGNOSIS — D509 Iron deficiency anemia, unspecified: Secondary | ICD-10-CM | POA: Diagnosis not present

## 2017-05-18 DIAGNOSIS — N2581 Secondary hyperparathyroidism of renal origin: Secondary | ICD-10-CM | POA: Diagnosis not present

## 2017-05-18 DIAGNOSIS — N186 End stage renal disease: Secondary | ICD-10-CM | POA: Diagnosis not present

## 2017-05-18 DIAGNOSIS — Z992 Dependence on renal dialysis: Secondary | ICD-10-CM | POA: Diagnosis not present

## 2017-05-20 DIAGNOSIS — N186 End stage renal disease: Secondary | ICD-10-CM | POA: Diagnosis not present

## 2017-05-20 DIAGNOSIS — D631 Anemia in chronic kidney disease: Secondary | ICD-10-CM | POA: Diagnosis not present

## 2017-05-20 DIAGNOSIS — N2581 Secondary hyperparathyroidism of renal origin: Secondary | ICD-10-CM | POA: Diagnosis not present

## 2017-05-20 DIAGNOSIS — Z992 Dependence on renal dialysis: Secondary | ICD-10-CM | POA: Diagnosis not present

## 2017-05-20 DIAGNOSIS — D509 Iron deficiency anemia, unspecified: Secondary | ICD-10-CM | POA: Diagnosis not present

## 2017-05-23 DIAGNOSIS — Z992 Dependence on renal dialysis: Secondary | ICD-10-CM | POA: Diagnosis not present

## 2017-05-23 DIAGNOSIS — N186 End stage renal disease: Secondary | ICD-10-CM | POA: Diagnosis not present

## 2017-05-23 DIAGNOSIS — N2581 Secondary hyperparathyroidism of renal origin: Secondary | ICD-10-CM | POA: Diagnosis not present

## 2017-05-23 DIAGNOSIS — D631 Anemia in chronic kidney disease: Secondary | ICD-10-CM | POA: Diagnosis not present

## 2017-05-23 DIAGNOSIS — D509 Iron deficiency anemia, unspecified: Secondary | ICD-10-CM | POA: Diagnosis not present

## 2017-05-24 ENCOUNTER — Encounter: Payer: Self-pay | Admitting: Internal Medicine

## 2017-05-24 ENCOUNTER — Ambulatory Visit (INDEPENDENT_AMBULATORY_CARE_PROVIDER_SITE_OTHER): Payer: Medicare Other | Admitting: Internal Medicine

## 2017-05-24 VITALS — BP 144/78 | HR 74 | Resp 16 | Ht 65.0 in | Wt 162.0 lb

## 2017-05-24 DIAGNOSIS — I2510 Atherosclerotic heart disease of native coronary artery without angina pectoris: Secondary | ICD-10-CM

## 2017-05-24 DIAGNOSIS — F17210 Nicotine dependence, cigarettes, uncomplicated: Secondary | ICD-10-CM

## 2017-05-24 DIAGNOSIS — J449 Chronic obstructive pulmonary disease, unspecified: Secondary | ICD-10-CM | POA: Diagnosis not present

## 2017-05-24 DIAGNOSIS — Z720 Tobacco use: Secondary | ICD-10-CM

## 2017-05-24 MED ORDER — TIOTROPIUM BROMIDE MONOHYDRATE 18 MCG IN CAPS: 18.0000 ug | ORAL_CAPSULE | Freq: Every day | RESPIRATORY_TRACT | 12 refills | Status: DC

## 2017-05-24 NOTE — Patient Instructions (Addendum)
--  Quitting smoking is the most important thing that you can do for your health.  --Quitting smoking will have greater affect on your health than any medicine that we can give you.   Start spiriva, can stop pulmicort.

## 2017-05-24 NOTE — Progress Notes (Signed)
[facility] Consultation      Assessment and Plan:  56 year old smoker with COPD and end-stage renal disease on hemodialysis.  COPD/emphysema. -Moderate emphysema, made worse with chronic bronchitis with continued smoking. Patient notes that the Pulmicort inhaler has not been helpful, and his breathing has continued to decline over the last several months. -He is started on Spiriva inhaler once daily, he can escalate up further as needed, he is told that he can stop his Pulmicort inhaler.  Nicotine abuse. -Discussed the importance of smoking cessation, spent greater than 3 minutes in discussion.    Date: 05/24/2017  MRN# [id number] TYKEL BADIE 09/12/1961    AUGUSTIN BUN is a 56 y.o. old male seen in consultation for chief complaint of:    Chief Complaint  Patient presents with  . Advice Only    Self referral: SOB mostly at night, wheezing and lots of chest tightness (duration) 3-4 months.    HPI:   The patient has been seen by the pulmonary service at [facility] in 2016 as part of a kidney transplant evaluation. On review of old records, the patient was noted to have COPD in the setting of active tobacco use, CAD with a 4 vessel CABG in 2015, and GERD.  He was recommended that he stop smoking, he was given a prescription for fluticasone and albuterol rescue inhaler. Patient brings in a copy of his most recent lab work performed on 04/25/2017, shows a hemoglobin of 10.5, ferritin of 800. WBC count of 10.8, absolute eosinophils 227, CO2 23  He notes that he can not get his breath a lot of time. He has pulmicort but stopped it because it does not seem to work, he can not taste anything. He is taking ventolin 3 or 4 times per week and feels that helps with his breathing. He is smoking half ppd or more per day depending on nerves. He has been thinking of quitting since his quadruple bypass about 2 years ago.  He drinks "about 2 beers" per day.   **Images personally  reviewed; chest x-ray 04/17/2016, lungs are hyperinflated consistent with emphysema. There are also changes of chronic bronchitis, mild right heart enlargement.  **PFT 06/24/15 @[facility]  Moderate/severe obstruction that is partly reversible and a decreased DLCO suggestive of emphysema  **Echo [facility] 12/2014 Study Conclusions - Left ventricle: The cavity size was normal. Systolic function was normal. The estimated ejection fraction was in the range of 55% to 60%. Wall motion was normal; there were no regional wall motion abnormalities. Left ventricular diastolic function parameters were normal.  **CT chest 08/19/15 IMPRESSION:  1. Mild emphysema.    2. Cardiomegaly.    3. Mildly dilated ascending aorta measuring 4.1 cm    PMHX:   Past Medical History:  Diagnosis Date  . Anemia   . Arthritis   . CHF (congestive heart failure) ([place])   . Colon polyps   . COPD (chronic obstructive pulmonary disease) ([place])   . Coronary artery disease    Non-ST elevation myocardial infarction in February of 2015. In the setting of hypertensive urgency and blood loss anemia. Cardiac catheterization showed significant three-vessel coronary artery disease. EF was 55% by echo. He underwent CABG at [facility] on May 19 with LIMA to LAD, Sequential SVG to OM1 and OM2, and SVG to RCA  . End stage renal disease ([place])    m-w-f   -davida [place]  . ETOH abuse   . GERD (gastroesophageal reflux disease)    hx  .  Hyperlipidemia   . Hypertension   . IgA nephropathy   . Lower GI bleed    Due to colon polyps. Status post resection of 14 polyps  . Non-ST elevation MI (NSTEMI) ([place])   . Pneumonia    hx  . Shortness of breath   . Tobacco abuse   . Tobacco abuse    Surgical Hx:  Past Surgical History:  Procedure Laterality Date  . A/V FISTULAGRAM Left 10/28/2016   Procedure: A/V Fistulagram;  Surgeon: [name], MD;  Location: [place] [facility];  Service: Cardiovascular;  Laterality: Left;  . A/V  SHUNT INTERVENTION N/A 10/28/2016   Procedure: A/V Shunt Intervention;  Surgeon: [name], MD;  Location: [place] [facility];  Service: Cardiovascular;  Laterality: N/A;  . AV FISTULA PLACEMENT    . CARDIAC CATHETERIZATION     RCA 90% and calcified mid LAD 80% Stenosis  . CORONARY ARTERY BYPASS GRAFT N/A 11/29/2013   Procedure: CORONARY ARTERY BYPASS GRAFTING (CABG) x 4 using endoscopically harvested right saphenous vein and left internal mammary artery;  Surgeon: [name], MD;  Location: [place] OR;  Service: Open Heart Surgery;  Laterality: N/A;  . dialysis catheterr  2/15  . INTRAOPERATIVE TRANSESOPHAGEAL ECHOCARDIOGRAM N/A 11/29/2013   Procedure: INTRAOPERATIVE TRANSESOPHAGEAL ECHOCARDIOGRAM;  Surgeon: [name], MD;  Location[facility];  Service: Open Heart Surgery;  Laterality: N/A;  . PERIPHERAL VASCULAR CATHETERIZATION N/A 06/12/2015   Procedure: A/V Shuntogram/Fistulagram;  Surgeon: [name], MD;  Location: [place] [facility];  Service: Cardiovascular;  Laterality: N/A;  . PERIPHERAL VASCULAR CATHETERIZATION Left 06/12/2015   Procedure: A/V Shunt Intervention;  Surgeon: [name], MD;  Location: [place] [facility];  Service: Cardiovascular;  Laterality: Left;  . RENAL BIOPSY Left 14   Family Hx:  Family History  Problem Relation Age of Onset  . Heart disease Father   . Heart disease Brother    Social Hx:   Social History  Substance Use Topics  . Smoking status: Current Every Day Smoker    Packs/day: 0.50    Years: 30.00    Types: Cigarettes  . Smokeless tobacco: Never Used     Comment: daily  . Alcohol use 1.2 oz/week    2 Cans of beer per week     Comment:  less now   Medication:    Current Outpatient Prescriptions:  .  albuterol (VENTOLIN HFA) 108 (90 Base) MCG/ACT inhaler, Inhale 2 puffs into the lungs every 6 (six) hours as needed for wheezing or shortness of breath., Disp: , Rfl:  .  amLODipine (NORVASC) 10 MG tablet, take 1 tablet by mouth once daily,  Disp: 30 tablet, Rfl: 5 .  aspirin EC 81 MG tablet, Take 1 tablet (81 mg total) by mouth daily., Disp: 90 tablet, Rfl: 3 .  atorvastatin (LIPITOR) 20 MG tablet, take 1 tablet by mouth at bedtime, Disp: 30 tablet, Rfl: 6 .  budesonide (PULMICORT) 180 MCG/ACT inhaler, Inhale 2 puffs into the lungs 2 (two) times daily., Disp: , Rfl:  .  cinacalcet (SENSIPAR) 30 MG tablet, Take 30 mg by mouth daily., Disp: , Rfl:  .  furosemide (LASIX) 80 MG tablet, Take 80 mg by mouth daily., Disp: , Rfl:  .  metoprolol (LOPRESSOR) 50 MG tablet, take 1 tablet by mouth twice a day, Disp: 60 tablet, Rfl: 6 .  traZODone (DESYREL) 50 MG tablet, Take 25 mg by mouth See admin instructions. Med is taking on nights pt  does not have to be up for dialysis Monday Wed  Friday and Sat nights, Disp: , Rfl:  .  zolpidem (AMBIEN) 10 MG tablet, Take 10 mg by mouth at bedtime as needed for sleep., Disp: , Rfl:   Current Facility-Administered Medications:  .  ceFAZolin (ANCEF) IVPB 1 g/50 mL premix, 1 g, Intravenous, Once, Dew, Erskine Squibb, MD   Allergies:  Patient has no known allergies.  Review of Systems: Gen:  Denies  fever, sweats, chills HEENT: Denies blurred vision, double vision. bleeds, sore throat Cvc:  No dizziness, chest pain. Resp:   Denies cough or sputum production, shortness of breath Gi: Denies swallowing difficulty, stomach pain. Gu:  Denies bladder incontinence, burning urine Ext:   No Joint pain, stiffness. Skin: No skin rash,  hives  Endoc:  No polyuria, polydipsia. Psych: No depression, insomnia. Other:  All other systems were reviewed with the patient and were negative other that what is mentioned in the HPI.   Physical Examination:   VS: BP (!) 144/78 (BP Location: Right Arm, Cuff Size: Normal)   Pulse 74   Resp 16   Ht 5\' 5"  (1.651 m)   Wt 162 lb (73.5 kg)   SpO2 96%   BMI 26.96 kg/m   General Appearance: No distress  Neuro:without focal findings,  speech normal,  HEENT: PERRLA, EOM intact.     Pulmonary: normal breath sounds, No wheezing.  CardiovascularNormal S1,S2.  No m/r/g.   Abdomen: Benign, Soft, non-tender. Renal:  No costovertebral tenderness  GU:  No performed at this time. Endoc: No evident thyromegaly, no signs of acromegaly. Skin:   warm, no rashes, no ecchymosis  Extremities: normal, no cyanosis, clubbing.  Other findings:    LABORATORY PANEL:   CBC No results for input(s): WBC, HGB, HCT, PLT in the last 168 hours. ------------------------------------------------------------------------------------------------------------------  Chemistries  No results for input(s): NA, K, CL, CO2, GLUCOSE, BUN, CREATININE, CALCIUM, MG, AST, ALT, ALKPHOS, BILITOT in the last 168 hours.  Invalid input(s): GFRCGP ------------------------------------------------------------------------------------------------------------------  Cardiac Enzymes No results for input(s): TROPONINI in the last 168 hours. ------------------------------------------------------------  RADIOLOGY:  No results found.     Thank  you for the consultation and for allowing [facility] to assist in the care of your patient. Our recommendations are noted above.  Please contact us if we can be of further service.   Marda Stalker, MD.  Board Certified in Internal Medicine, Pulmonary Medicine, [place], and Sleep Medicine.  [facility] Office Number: [phone]  Patricia Pesa, M.D.  Merton Border, M.D  05/24/2017

## 2017-05-25 DIAGNOSIS — N2581 Secondary hyperparathyroidism of renal origin: Secondary | ICD-10-CM | POA: Diagnosis not present

## 2017-05-25 DIAGNOSIS — N186 End stage renal disease: Secondary | ICD-10-CM | POA: Diagnosis not present

## 2017-05-25 DIAGNOSIS — Z992 Dependence on renal dialysis: Secondary | ICD-10-CM | POA: Diagnosis not present

## 2017-05-25 DIAGNOSIS — D509 Iron deficiency anemia, unspecified: Secondary | ICD-10-CM | POA: Diagnosis not present

## 2017-05-25 DIAGNOSIS — D631 Anemia in chronic kidney disease: Secondary | ICD-10-CM | POA: Diagnosis not present

## 2017-05-28 DIAGNOSIS — D631 Anemia in chronic kidney disease: Secondary | ICD-10-CM | POA: Diagnosis not present

## 2017-05-28 DIAGNOSIS — N186 End stage renal disease: Secondary | ICD-10-CM | POA: Diagnosis not present

## 2017-05-28 DIAGNOSIS — D509 Iron deficiency anemia, unspecified: Secondary | ICD-10-CM | POA: Diagnosis not present

## 2017-05-28 DIAGNOSIS — N2581 Secondary hyperparathyroidism of renal origin: Secondary | ICD-10-CM | POA: Diagnosis not present

## 2017-05-28 DIAGNOSIS — Z992 Dependence on renal dialysis: Secondary | ICD-10-CM | POA: Diagnosis not present

## 2017-05-30 DIAGNOSIS — Z992 Dependence on renal dialysis: Secondary | ICD-10-CM | POA: Diagnosis not present

## 2017-05-30 DIAGNOSIS — N2581 Secondary hyperparathyroidism of renal origin: Secondary | ICD-10-CM | POA: Diagnosis not present

## 2017-05-30 DIAGNOSIS — N186 End stage renal disease: Secondary | ICD-10-CM | POA: Diagnosis not present

## 2017-05-30 DIAGNOSIS — D509 Iron deficiency anemia, unspecified: Secondary | ICD-10-CM | POA: Diagnosis not present

## 2017-05-30 DIAGNOSIS — D631 Anemia in chronic kidney disease: Secondary | ICD-10-CM | POA: Diagnosis not present

## 2017-06-01 DIAGNOSIS — D631 Anemia in chronic kidney disease: Secondary | ICD-10-CM | POA: Diagnosis not present

## 2017-06-01 DIAGNOSIS — N186 End stage renal disease: Secondary | ICD-10-CM | POA: Diagnosis not present

## 2017-06-01 DIAGNOSIS — D509 Iron deficiency anemia, unspecified: Secondary | ICD-10-CM | POA: Diagnosis not present

## 2017-06-01 DIAGNOSIS — N2581 Secondary hyperparathyroidism of renal origin: Secondary | ICD-10-CM | POA: Diagnosis not present

## 2017-06-01 DIAGNOSIS — Z992 Dependence on renal dialysis: Secondary | ICD-10-CM | POA: Diagnosis not present

## 2017-06-03 ENCOUNTER — Telehealth: Payer: Self-pay | Admitting: Internal Medicine

## 2017-06-03 DIAGNOSIS — N186 End stage renal disease: Secondary | ICD-10-CM | POA: Diagnosis not present

## 2017-06-03 DIAGNOSIS — D631 Anemia in chronic kidney disease: Secondary | ICD-10-CM | POA: Diagnosis not present

## 2017-06-03 DIAGNOSIS — D509 Iron deficiency anemia, unspecified: Secondary | ICD-10-CM | POA: Diagnosis not present

## 2017-06-03 DIAGNOSIS — Z992 Dependence on renal dialysis: Secondary | ICD-10-CM | POA: Diagnosis not present

## 2017-06-03 DIAGNOSIS — N2581 Secondary hyperparathyroidism of renal origin: Secondary | ICD-10-CM | POA: Diagnosis not present

## 2017-06-03 NOTE — Telephone Encounter (Signed)
Received paper rx for signature to d/c budesinide inhaler and replace with nebs. DR has signed.and agreed. Med list has been updated.

## 2017-06-03 NOTE — Telephone Encounter (Signed)
Med for home pharmacy calling asking for [place] to please add nebulizer solution for Budesonide  In order for insurance to cover it Please advise

## 2017-06-06 DIAGNOSIS — D509 Iron deficiency anemia, unspecified: Secondary | ICD-10-CM | POA: Diagnosis not present

## 2017-06-06 DIAGNOSIS — D631 Anemia in chronic kidney disease: Secondary | ICD-10-CM | POA: Diagnosis not present

## 2017-06-06 DIAGNOSIS — Z992 Dependence on renal dialysis: Secondary | ICD-10-CM | POA: Diagnosis not present

## 2017-06-06 DIAGNOSIS — N186 End stage renal disease: Secondary | ICD-10-CM | POA: Diagnosis not present

## 2017-06-06 DIAGNOSIS — N2581 Secondary hyperparathyroidism of renal origin: Secondary | ICD-10-CM | POA: Diagnosis not present

## 2017-06-08 DIAGNOSIS — N2581 Secondary hyperparathyroidism of renal origin: Secondary | ICD-10-CM | POA: Diagnosis not present

## 2017-06-08 DIAGNOSIS — D509 Iron deficiency anemia, unspecified: Secondary | ICD-10-CM | POA: Diagnosis not present

## 2017-06-08 DIAGNOSIS — N186 End stage renal disease: Secondary | ICD-10-CM | POA: Diagnosis not present

## 2017-06-08 DIAGNOSIS — D631 Anemia in chronic kidney disease: Secondary | ICD-10-CM | POA: Diagnosis not present

## 2017-06-08 DIAGNOSIS — Z992 Dependence on renal dialysis: Secondary | ICD-10-CM | POA: Diagnosis not present

## 2017-06-10 DIAGNOSIS — Z992 Dependence on renal dialysis: Secondary | ICD-10-CM | POA: Diagnosis not present

## 2017-06-10 DIAGNOSIS — D509 Iron deficiency anemia, unspecified: Secondary | ICD-10-CM | POA: Diagnosis not present

## 2017-06-10 DIAGNOSIS — D631 Anemia in chronic kidney disease: Secondary | ICD-10-CM | POA: Diagnosis not present

## 2017-06-10 DIAGNOSIS — N186 End stage renal disease: Secondary | ICD-10-CM | POA: Diagnosis not present

## 2017-06-10 DIAGNOSIS — N2581 Secondary hyperparathyroidism of renal origin: Secondary | ICD-10-CM | POA: Diagnosis not present

## 2017-06-12 DIAGNOSIS — Z992 Dependence on renal dialysis: Secondary | ICD-10-CM | POA: Diagnosis not present

## 2017-06-12 DIAGNOSIS — N186 End stage renal disease: Secondary | ICD-10-CM | POA: Diagnosis not present

## 2017-06-13 DIAGNOSIS — Z23 Encounter for immunization: Secondary | ICD-10-CM | POA: Diagnosis not present

## 2017-06-13 DIAGNOSIS — N186 End stage renal disease: Secondary | ICD-10-CM | POA: Diagnosis not present

## 2017-06-13 DIAGNOSIS — N2581 Secondary hyperparathyroidism of renal origin: Secondary | ICD-10-CM | POA: Diagnosis not present

## 2017-06-13 DIAGNOSIS — D509 Iron deficiency anemia, unspecified: Secondary | ICD-10-CM | POA: Diagnosis not present

## 2017-06-13 DIAGNOSIS — Z992 Dependence on renal dialysis: Secondary | ICD-10-CM | POA: Diagnosis not present

## 2017-06-13 DIAGNOSIS — D631 Anemia in chronic kidney disease: Secondary | ICD-10-CM | POA: Diagnosis not present

## 2017-06-15 DIAGNOSIS — Z23 Encounter for immunization: Secondary | ICD-10-CM | POA: Diagnosis not present

## 2017-06-15 DIAGNOSIS — N2581 Secondary hyperparathyroidism of renal origin: Secondary | ICD-10-CM | POA: Diagnosis not present

## 2017-06-15 DIAGNOSIS — D631 Anemia in chronic kidney disease: Secondary | ICD-10-CM | POA: Diagnosis not present

## 2017-06-15 DIAGNOSIS — D509 Iron deficiency anemia, unspecified: Secondary | ICD-10-CM | POA: Diagnosis not present

## 2017-06-15 DIAGNOSIS — Z992 Dependence on renal dialysis: Secondary | ICD-10-CM | POA: Diagnosis not present

## 2017-06-15 DIAGNOSIS — N186 End stage renal disease: Secondary | ICD-10-CM | POA: Diagnosis not present

## 2017-06-17 DIAGNOSIS — Z992 Dependence on renal dialysis: Secondary | ICD-10-CM | POA: Diagnosis not present

## 2017-06-17 DIAGNOSIS — N186 End stage renal disease: Secondary | ICD-10-CM | POA: Diagnosis not present

## 2017-06-17 DIAGNOSIS — D509 Iron deficiency anemia, unspecified: Secondary | ICD-10-CM | POA: Diagnosis not present

## 2017-06-17 DIAGNOSIS — N2581 Secondary hyperparathyroidism of renal origin: Secondary | ICD-10-CM | POA: Diagnosis not present

## 2017-06-17 DIAGNOSIS — Z23 Encounter for immunization: Secondary | ICD-10-CM | POA: Diagnosis not present

## 2017-06-17 DIAGNOSIS — D631 Anemia in chronic kidney disease: Secondary | ICD-10-CM | POA: Diagnosis not present

## 2017-06-20 DIAGNOSIS — D509 Iron deficiency anemia, unspecified: Secondary | ICD-10-CM | POA: Diagnosis not present

## 2017-06-20 DIAGNOSIS — D631 Anemia in chronic kidney disease: Secondary | ICD-10-CM | POA: Diagnosis not present

## 2017-06-20 DIAGNOSIS — Z23 Encounter for immunization: Secondary | ICD-10-CM | POA: Diagnosis not present

## 2017-06-20 DIAGNOSIS — N2581 Secondary hyperparathyroidism of renal origin: Secondary | ICD-10-CM | POA: Diagnosis not present

## 2017-06-20 DIAGNOSIS — Z992 Dependence on renal dialysis: Secondary | ICD-10-CM | POA: Diagnosis not present

## 2017-06-20 DIAGNOSIS — N186 End stage renal disease: Secondary | ICD-10-CM | POA: Diagnosis not present

## 2017-06-22 DIAGNOSIS — D509 Iron deficiency anemia, unspecified: Secondary | ICD-10-CM | POA: Diagnosis not present

## 2017-06-22 DIAGNOSIS — N2581 Secondary hyperparathyroidism of renal origin: Secondary | ICD-10-CM | POA: Diagnosis not present

## 2017-06-22 DIAGNOSIS — Z992 Dependence on renal dialysis: Secondary | ICD-10-CM | POA: Diagnosis not present

## 2017-06-22 DIAGNOSIS — Z23 Encounter for immunization: Secondary | ICD-10-CM | POA: Diagnosis not present

## 2017-06-22 DIAGNOSIS — D631 Anemia in chronic kidney disease: Secondary | ICD-10-CM | POA: Diagnosis not present

## 2017-06-22 DIAGNOSIS — N186 End stage renal disease: Secondary | ICD-10-CM | POA: Diagnosis not present

## 2017-06-24 DIAGNOSIS — D631 Anemia in chronic kidney disease: Secondary | ICD-10-CM | POA: Diagnosis not present

## 2017-06-24 DIAGNOSIS — N186 End stage renal disease: Secondary | ICD-10-CM | POA: Diagnosis not present

## 2017-06-24 DIAGNOSIS — Z992 Dependence on renal dialysis: Secondary | ICD-10-CM | POA: Diagnosis not present

## 2017-06-24 DIAGNOSIS — Z23 Encounter for immunization: Secondary | ICD-10-CM | POA: Diagnosis not present

## 2017-06-24 DIAGNOSIS — N2581 Secondary hyperparathyroidism of renal origin: Secondary | ICD-10-CM | POA: Diagnosis not present

## 2017-06-24 DIAGNOSIS — D509 Iron deficiency anemia, unspecified: Secondary | ICD-10-CM | POA: Diagnosis not present

## 2017-06-27 DIAGNOSIS — Z992 Dependence on renal dialysis: Secondary | ICD-10-CM | POA: Diagnosis not present

## 2017-06-27 DIAGNOSIS — D509 Iron deficiency anemia, unspecified: Secondary | ICD-10-CM | POA: Diagnosis not present

## 2017-06-27 DIAGNOSIS — N2581 Secondary hyperparathyroidism of renal origin: Secondary | ICD-10-CM | POA: Diagnosis not present

## 2017-06-27 DIAGNOSIS — N186 End stage renal disease: Secondary | ICD-10-CM | POA: Diagnosis not present

## 2017-06-27 DIAGNOSIS — D631 Anemia in chronic kidney disease: Secondary | ICD-10-CM | POA: Diagnosis not present

## 2017-06-27 DIAGNOSIS — Z23 Encounter for immunization: Secondary | ICD-10-CM | POA: Diagnosis not present

## 2017-06-28 ENCOUNTER — Ambulatory Visit (INDEPENDENT_AMBULATORY_CARE_PROVIDER_SITE_OTHER): Payer: Medicare Other

## 2017-06-28 ENCOUNTER — Ambulatory Visit (INDEPENDENT_AMBULATORY_CARE_PROVIDER_SITE_OTHER): Payer: Medicare Other | Admitting: Vascular Surgery

## 2017-06-28 ENCOUNTER — Encounter (INDEPENDENT_AMBULATORY_CARE_PROVIDER_SITE_OTHER): Payer: Self-pay | Admitting: Vascular Surgery

## 2017-06-28 VITALS — BP 159/89 | HR 76 | Resp 15 | Ht 67.0 in | Wt 160.0 lb

## 2017-06-28 DIAGNOSIS — I10 Essential (primary) hypertension: Secondary | ICD-10-CM | POA: Diagnosis not present

## 2017-06-28 DIAGNOSIS — E785 Hyperlipidemia, unspecified: Secondary | ICD-10-CM

## 2017-06-28 DIAGNOSIS — Z992 Dependence on renal dialysis: Secondary | ICD-10-CM

## 2017-06-28 DIAGNOSIS — N186 End stage renal disease: Secondary | ICD-10-CM

## 2017-06-28 DIAGNOSIS — I2510 Atherosclerotic heart disease of native coronary artery without angina pectoris: Secondary | ICD-10-CM

## 2017-06-28 NOTE — Patient Instructions (Signed)
Vascular Access for Hemodialysis A vascular access is a connection between two blood vessels that allows blood to be easily removed from the body and returned to the body during hemodialysis. Hemodialysis is a procedure in which a machine outside of the body filters the blood. There are three types of vascular accesses:  Arteriovenous fistula. This is a connection between an artery and a vein (usually in the arm) that is made by sewing them together. Blood in the artery flows directly into the vein, causing it to get larger over time. This makes it easier for the vein to be used for hemodialysis. An arteriovenous fistula takes 1-6 months to develop after surgery.  Arteriovenous graft. This is a connection between an artery and a vein in the arm that is made with a tube. An arteriovenous graft can be used within 2-3 weeks of surgery.  Venous catheter. This is a thin, flexible tube that is placed in a large vein (usually in the neck, chest, or groin). A venous catheter for hemodialysis contains two tubes that come out of the skin. A venous catheter can be used right away. It is usually used as a temporary access if you need hemodialysis before a fistula or graft has developed. It may also be used as a permanent access if a fistula or graft cannot be created.  Which type of access is best for me? The type of access that is best for you depends on the size and strength of your veins. A fistula is usually the preferred type of access. It can last several years and is less likely than the other types of accesses to become infected or to cause blood clots within a blood vessel (thrombosis). However, a fistula is not an option for everyone. If your veins are not the right size, a graft may be used instead. Grafts require you to have strong veins. If your veins are not strong enough for a graft, a catheter may be used. Catheters are more likely than fistulas and grafts to become infected or to have  thrombosis. Sometimes, only one type of access is an option. Your health care provider will help you determine which type of access is best for you. How is a vascular access used? The way the access is used depends on the type of access:  If the access is a fistula or graft, two needles are inserted through the skin into the access before each hemodialysis session. Blood leaves the body through one of the needles and travels through a tube to the hemodialysis machine (dialyzer). It then flows through another tube and returns to the body through the second needle.  If the access is a catheter, one tube is connected directly to the tube that leads to the dialyzer and the other is connected to a tube that leads away from the dialyzer. Blood leaves the body through one tube and returns to the body through the other.  What kind of problems can occur with vascular accesses?  Blood clots within a blood vessel (thrombosis). Thrombosis can lead to a narrowing of a blood vessel or tube (stenosis). If thrombosis occurs frequently, another access site may be created as a backup.  Infection. These problems are most likely to occur with a venous catheter and least likely to occur with an arteriovenous fistula. How do I care for my vascular access? Wear a medical alert bracelet. This tells health care providers that you are a dialysis patient in the case of an emergency and   allows them to care for your veins appropriately. If you have a graft or fistula:  A "bruit" is a noise that is heard with a stethoscope and a "thrill" is a vibration felt over the graft or fistula. The presence of the bruit and thrill indicates that the access is working. You will be taught to feel for the thrill each day. If this is not felt, the access may be clotted. Call your health care provider.  You may use the arm where your vascular access is located freely after the site heals. Keep the following in mind: ? Avoid pressure on the  arm. ? Avoid lifting heavy objects with the arm. ? Avoid sleeping on the arm. ? Avoid wearing tight-sleeved shirts or jewelry around the graft or fistula.  Do not allow blood pressure monitoring or needle punctures on the side where the graft or fistula is located.  With permission from your health care provider, you may do exercises to help with blood flow through a fistula. These exercises involve squeezing a rubber ball or other soft objects as instructed.  Contact a health care provider if:  Chills develop.  You have an oral temperature above 102 F (38.9 C).  Swelling around the graft or fistula gets worse.  New pain develops.  Pus or other fluid (drainage) is seen at the vascular access site.  Skin redness or red streaking is seen on the skin around, above, or below the vascular access. Get help right away if:  Pain, numbness, or an unusual pale skin color develops in the hand on the side of your fistula.  Dizziness or weakness develops that you have not had before.  The vascular access has bleeding that cannot be easily controlled. This information is not intended to replace advice given to you by your health care provider. Make sure you discuss any questions you have with your health care provider. Document Released: 11/20/2002 Document Revised: 02/05/2016 Document Reviewed: 01/16/2013 Elsevier Interactive Patient Education  2017 Elsevier Inc.  

## 2017-06-28 NOTE — Progress Notes (Signed)
MRN : [id number]  Daryl Weeks is a 56 y.o. (1961-04-29) male who presents with chief complaint of No chief complaint on file. [name]  History of Present Illness: Patient returns today in follow up of his dialysis access.  It is working well without any major issues currently. The access is working well without any current issues with difficult access, prolonged bleeding or diminished flow.  Duplex today shows a patent, aneurysmal left radial basilic AV fistula without internal stenosis identified  Current Outpatient Prescriptions  Medication Sig Dispense Refill  . amLODipine (NORVASC) 10 MG tablet take 1 tablet by mouth once daily 30 tablet 5  . aspirin EC 81 MG tablet Take 1 tablet (81 mg total) by mouth daily. 90 tablet 3  . atorvastatin (LIPITOR) 20 MG tablet take 1 tablet by mouth at bedtime 30 tablet 6  . cinacalcet (SENSIPAR) 30 MG tablet Take 30 mg by mouth daily.    . furosemide (LASIX) 80 MG tablet Take 80 mg by mouth daily.    . metoprolol (LOPRESSOR) 50 MG tablet take 1 tablet by mouth twice a day 60 tablet 6  . traMADol (ULTRAM) 50 MG tablet Take 1 tablet (50 mg total) by mouth every 6 (six) hours as needed. 40 tablet 0  . traZODone (DESYREL) 50 MG tablet Take 25 mg by mouth See admin instructions. Med is taking on nights pt does not have to be up for dialysis Monday Wed  Friday and Sat nights    . zolpidem (AMBIEN) 5 MG tablet Take 1 tablet (5 mg total) by mouth at bedtime as needed for sleep. (Patient taking differently: Take 5 mg by mouth See admin instructions. Pt takes on nights before dialysis. Lydia Guiles and Thursday nights) 30 tablet 0            Current Facility-Administered Medications  Medication Dose Route Frequency Provider Last Rate Last Dose  . ceFAZolin (ANCEF) IVPB 1 g/50 mL premix  1 g Intravenous Once [name], MD            Past Medical History:  Diagnosis Date  . Anemia   . Arthritis   . CHF (congestive heart failure) ([place])   . Colon  polyps   . COPD (chronic obstructive pulmonary disease) ([place])   . Coronary artery disease    Non-ST elevation myocardial infarction in February of 2015. In the setting of hypertensive urgency and blood loss anemia. Cardiac catheterization showed significant three-vessel coronary artery disease. EF was 55% by echo. He underwent [facility]G at [facility] on May 19 with LIMA to LAD, Sequential SVG to OM1 and OM2, and SVG to RCA  . End stage renal disease ([place])    m-w-f   -[name]  . ETOH abuse   . GERD (gastroesophageal reflux disease)    hx  . Hyperlipidemia   . Hypertension   . IgA nephropathy   . Lower GI bleed    Due to colon polyps. Status post resection of 14 polyps  . Non-ST elevation MI (NSTEMI) ([place])   . Pneumonia    hx  . Shortness of breath   . Tobacco abuse   . Tobacco abuse          Past Surgical History:  Procedure Laterality Date  . A/V SHUNT INTERVENTION N/A 10/28/2016   Procedure: A/V Shunt Intervention;  Surgeon: [name], MD;  Location: [place] [facility];  Service: Cardiovascular;  Laterality: N/A;  . A/V SHUNTOGRAM Left 10/28/2016  Procedure: A/V Fistulagram;  Surgeon: [name], MD;  Location: [place] [facility];  Service: Cardiovascular;  Laterality: Left;  . AV FISTULA PLACEMENT    . CARDIAC CATHETERIZATION     RCA 90% and calcified mid LAD 80% Stenosis  . CORONARY ARTERY BYPASS GRAFT N/A 11/29/2013   Procedure: CORONARY ARTERY BYPASS GRAFTING (CABG) x 4 using endoscopically harvested right saphenous vein and left internal mammary artery;  Surgeon: [name], MD;  Location: [place] OR;  Service: Open Heart Surgery;  Laterality: N/A;  . dialysis catheterr  2/15  . INTRAOPERATIVE TRANSESOPHAGEAL ECHOCARDIOGRAM N/A 11/29/2013   Procedure: INTRAOPERATIVE TRANSESOPHAGEAL ECHOCARDIOGRAM;  Surgeon: [name], MD;  Location: [facility] OR;  Service: Open Heart Surgery;  Laterality: N/A;  . PERIPHERAL VASCULAR CATHETERIZATION N/A  06/12/2015   Procedure: A/V Shuntogram/Fistulagram;  Surgeon: [name], MD;  Location: [place] [facility];  Service: Cardiovascular;  Laterality: N/A;  . PERIPHERAL VASCULAR CATHETERIZATION Left 06/12/2015   Procedure: A/V Shunt Intervention;  Surgeon: [name], MD;  Location: [place] [facility];  Service: Cardiovascular;  Laterality: Left;  . RENAL BIOPSY Left 14    Social History        Social History  Substance Use Topics  . Smoking status: Current Every Day Smoker    Packs/day: 0.50    Years: 30.00    Types: Cigarettes  . Smokeless tobacco: Never Used  . Alcohol use 1.2 oz/week     2 Cans of beer per week      Comment:  less now    Family History      Family History  Problem Relation Age of Onset  . Heart disease Father   . Heart disease Brother     No Known Allergies   REVIEW OF SYSTEMS (Negative unless checked)  Constitutional: [] Weight loss  [] Fever  [] Chills Cardiac: [] Chest pain   [] Chest pressure   [] Palpitations   [] Shortness of breath when laying flat   [] Shortness of breath at rest   [] Shortness of breath with exertion. Vascular:  [] Pain in legs with walking   [] Pain in legs at rest   [] Pain in legs when laying flat   [] Claudication   [] Pain in feet when walking  [] Pain in feet at rest  [] Pain in feet when laying flat   [] History of DVT   [] Phlebitis   [] Swelling in legs   [] Varicose veins   [] Non-healing ulcers Pulmonary:   [] Uses home oxygen   [] Productive cough   [] Hemoptysis   [] Wheeze  [x] COPD   [] Asthma Neurologic:  [] Dizziness  [] Blackouts   [] Seizures   [] History of stroke   [] History of TIA  [] Aphasia   [] Temporary blindness   [] Dysphagia   [] Weakness or numbness in arms   [] Weakness or numbness in legs Musculoskeletal:  [x] Arthritis   [] Joint swelling   [] Joint pain   [] Low back pain Hematologic:  [] Easy bruising  [] Easy bleeding   [] Hypercoagulable state   [x] Anemic   Gastrointestinal:  [] Blood in stool   [] Vomiting  blood  [] Gastroesophageal reflux/heartburn   [] Abdominal pain Genitourinary:  [x] Chronic kidney disease   [] Difficult urination  [] Frequent urination  [] Burning with urination   [] Hematuria Skin:  [] Rashes   [] Ulcers   [] Wounds Psychological:  [] History of anxiety   []  History of major depression.    Physical Examination  There were no vitals taken for this visit. Gen:  WD/WN, NAD. Appears older than stated age. Head: Central/AT, No temporalis wasting. Ear/Nose/Throat: Hearing grossly  intact, nares w/o erythema or drainage, trachea midline Eyes: Conjunctiva clear. Sclera non-icteric Neck: Supple.  No JVD.  Pulmonary:  Good air movement, no use of accessory muscles.  Cardiac: RRR, normal S1, S2 Vascular: aneurysmal left forearm AVF with good thrill Vessel Right Left  Radial Palpable Palpable                                    Musculoskeletal: M/S 5/5 throughout.  No deformity or atrophy.  Neurologic: Sensation grossly intact in extremities.  Symmetrical.  Speech is fluent.  Psychiatric: Judgment intact, Mood & affect appropriate for pt's clinical situation. Dermatologic: No rashes or ulcers noted.  No cellulitis or open wounds.       Labs No results found for this or any previous visit (from the past 2160 hour(s)).  Radiology No results found.    Assessment/Plan Hypertension blood pressure control important in reducing the progression of atherosclerotic disease. On appropriate oral medications.   Hyperlipidemia lipid control important in reducing the progression of atherosclerotic disease. Continue statin therapy   ESRD on dialysis [facility]) He underwent an intervention for his left radial basilic AV fistula about 6-7 months ago. The fistula is working well since that time. His duplex today shows stable improvement in the velocities in the perianastomotic area after the intervention. No intervention currently necessary. Plan to recheck in about 12 months with  duplex. No problem-specific Assessment & Plan notes found for this encounter.    Leotis Pain, MD  06/28/2017 8:49 AM    This note was created with Dragon medical transcription system.  Any errors from dictation are purely unintentional

## 2017-06-29 DIAGNOSIS — D509 Iron deficiency anemia, unspecified: Secondary | ICD-10-CM | POA: Diagnosis not present

## 2017-06-29 DIAGNOSIS — Z23 Encounter for immunization: Secondary | ICD-10-CM | POA: Diagnosis not present

## 2017-06-29 DIAGNOSIS — N2581 Secondary hyperparathyroidism of renal origin: Secondary | ICD-10-CM | POA: Diagnosis not present

## 2017-06-29 DIAGNOSIS — D631 Anemia in chronic kidney disease: Secondary | ICD-10-CM | POA: Diagnosis not present

## 2017-06-29 DIAGNOSIS — Z992 Dependence on renal dialysis: Secondary | ICD-10-CM | POA: Diagnosis not present

## 2017-06-29 DIAGNOSIS — N186 End stage renal disease: Secondary | ICD-10-CM | POA: Diagnosis not present

## 2017-07-01 DIAGNOSIS — Z992 Dependence on renal dialysis: Secondary | ICD-10-CM | POA: Diagnosis not present

## 2017-07-01 DIAGNOSIS — Z23 Encounter for immunization: Secondary | ICD-10-CM | POA: Diagnosis not present

## 2017-07-01 DIAGNOSIS — D631 Anemia in chronic kidney disease: Secondary | ICD-10-CM | POA: Diagnosis not present

## 2017-07-01 DIAGNOSIS — N186 End stage renal disease: Secondary | ICD-10-CM | POA: Diagnosis not present

## 2017-07-01 DIAGNOSIS — N2581 Secondary hyperparathyroidism of renal origin: Secondary | ICD-10-CM | POA: Diagnosis not present

## 2017-07-01 DIAGNOSIS — D509 Iron deficiency anemia, unspecified: Secondary | ICD-10-CM | POA: Diagnosis not present

## 2017-07-04 DIAGNOSIS — N186 End stage renal disease: Secondary | ICD-10-CM | POA: Diagnosis not present

## 2017-07-04 DIAGNOSIS — D631 Anemia in chronic kidney disease: Secondary | ICD-10-CM | POA: Diagnosis not present

## 2017-07-04 DIAGNOSIS — Z992 Dependence on renal dialysis: Secondary | ICD-10-CM | POA: Diagnosis not present

## 2017-07-04 DIAGNOSIS — Z23 Encounter for immunization: Secondary | ICD-10-CM | POA: Diagnosis not present

## 2017-07-04 DIAGNOSIS — N2581 Secondary hyperparathyroidism of renal origin: Secondary | ICD-10-CM | POA: Diagnosis not present

## 2017-07-04 DIAGNOSIS — D509 Iron deficiency anemia, unspecified: Secondary | ICD-10-CM | POA: Diagnosis not present

## 2017-07-06 DIAGNOSIS — D509 Iron deficiency anemia, unspecified: Secondary | ICD-10-CM | POA: Diagnosis not present

## 2017-07-06 DIAGNOSIS — D631 Anemia in chronic kidney disease: Secondary | ICD-10-CM | POA: Diagnosis not present

## 2017-07-06 DIAGNOSIS — N2581 Secondary hyperparathyroidism of renal origin: Secondary | ICD-10-CM | POA: Diagnosis not present

## 2017-07-06 DIAGNOSIS — N186 End stage renal disease: Secondary | ICD-10-CM | POA: Diagnosis not present

## 2017-07-06 DIAGNOSIS — Z992 Dependence on renal dialysis: Secondary | ICD-10-CM | POA: Diagnosis not present

## 2017-07-06 DIAGNOSIS — Z23 Encounter for immunization: Secondary | ICD-10-CM | POA: Diagnosis not present

## 2017-07-07 ENCOUNTER — Ambulatory Visit (INDEPENDENT_AMBULATORY_CARE_PROVIDER_SITE_OTHER): Payer: Medicare Other | Admitting: Cardiovascular Disease

## 2017-07-07 ENCOUNTER — Encounter: Payer: Self-pay | Admitting: Cardiovascular Disease

## 2017-07-07 VITALS — BP 140/82 | HR 66 | Ht 66.0 in | Wt 156.8 lb

## 2017-07-07 DIAGNOSIS — I2510 Atherosclerotic heart disease of native coronary artery without angina pectoris: Secondary | ICD-10-CM | POA: Diagnosis not present

## 2017-07-07 DIAGNOSIS — I5032 Chronic diastolic (congestive) heart failure: Secondary | ICD-10-CM

## 2017-07-07 DIAGNOSIS — E785 Hyperlipidemia, unspecified: Secondary | ICD-10-CM

## 2017-07-07 DIAGNOSIS — I10 Essential (primary) hypertension: Secondary | ICD-10-CM

## 2017-07-07 DIAGNOSIS — Z720 Tobacco use: Secondary | ICD-10-CM | POA: Diagnosis not present

## 2017-07-07 NOTE — Patient Instructions (Signed)
Medication Instructions: Continue same medications.   Labwork: None.   Procedures/Testing: None.   Follow-Up: 6 months with Dr. Arida.   Any Additional Special Instructions Will Be Listed Below (If Applicable).     If you need a refill on your cardiac medications before your next appointment, please call your pharmacy.   

## 2017-07-07 NOTE — Progress Notes (Signed)
Cardiology Office Note   Date:  07/07/2017   ID:  Daryl Weeks, DOB 24-Dec-1960, MRN [id number]  PCP:  Glendon Axe, MD  Cardiologist:   Kathlyn Sacramento, MD   Chief Complaint  Patient presents with  . OTHER    6 month f/u c/o congestion.  Meds reviewed verbally with pt.      History of Present Illness: Daryl Weeks is a 56 y.o. male who presents for a followup visit regarding coronary artery disease.  He is status post CABG in March 2015 after he presented with non-ST elevation myocardial infarction in the setting of severe anemia due to GI bleed.  He has known history of end-stage renal disease currently on dialysis and he is on the transplant list at [facility]. Other medical conditions include chronic diastolic heart failure, hypertension, hyperlipidemia and previous tobacco and alcohol use.  Cardiac workup was done in March 2016 in preparation for kidney transplant. Nuclear stress test was normal. Echocardiogram showed normal LV systolic function with mildly dilated left atrium.  He was taken off the kidney transplant list due to continued tobacco use.  He continues to smoke half a pack per day.  He drinks occasional beer but he denies excessive use.  He had recent congestion with associated chest pain.  No worsening shortness of breath.  Past Medical History:  Diagnosis Date  . Anemia   . Arthritis   . CHF (congestive heart failure) ([place])   . Colon polyps   . COPD (chronic obstructive pulmonary disease) ([place])   . Coronary artery disease    Non-ST elevation myocardial infarction in February of 2015. In the setting of hypertensive urgency and blood loss anemia. Cardiac catheterization showed significant three-vessel coronary artery disease. EF was 55% by echo. He underwent CABG at [facility] on May 19 with LIMA to LAD, Sequential SVG to OM1 and OM2, and SVG to RCA  . End stage renal disease ([place])    m-w-f   -davida   . ETOH abuse   . GERD (gastroesophageal reflux disease)      hx  . Hyperlipidemia   . Hypertension   . IgA nephropathy   . Lower GI bleed    Due to colon polyps. Status post resection of 14 polyps  . Non-ST elevation MI (NSTEMI) ([place])   . Pneumonia    hx  . Shortness of breath   . Tobacco abuse   . Tobacco abuse     Past Surgical History:  Procedure Laterality Date  . A/V FISTULAGRAM Left 10/28/2016   Procedure: A/V Fistulagram;  Surgeon: [name], MD;  Location: [place] CV LAB;  Service: Cardiovascular;  Laterality: Left;  . A/V SHUNT INTERVENTION N/A 10/28/2016   Procedure: A/V Shunt Intervention;  Surgeon: [name], MD;  Location: [place] CV LAB;  Service: Cardiovascular;  Laterality: N/A;  . AV FISTULA PLACEMENT    . CARDIAC CATHETERIZATION     RCA 90% and calcified mid LAD 80% Stenosis  . CORONARY ARTERY BYPASS GRAFT N/A 11/29/2013   Procedure: CORONARY ARTERY BYPASS GRAFTING (CABG) x 4 using endoscopically harvested right saphenous vein and left internal mammary artery;  Surgeon: [name], MD;  Location: [place] OR;  Service: Open Heart Surgery;  Laterality: N/A;  . dialysis catheterr  2/15  . INTRAOPERATIVE TRANSESOPHAGEAL ECHOCARDIOGRAM N/A 11/29/2013   Procedure: INTRAOPERATIVE TRANSESOPHAGEAL ECHOCARDIOGRAM;  Surgeon: [name], MD;  Location: [facility] OR;  Service: Open Heart Surgery;  Laterality: N/A;  . PERIPHERAL  VASCULAR CATHETERIZATION N/A 06/12/2015   Procedure: A/V Shuntogram/Fistulagram;  Surgeon: [name], MD;  Location: [place] CV LAB;  Service: Cardiovascular;  Laterality: N/A;  . PERIPHERAL VASCULAR CATHETERIZATION Left 06/12/2015   Procedure: A/V Shunt Intervention;  Surgeon: [name], MD;  Location: [place] CV LAB;  Service: Cardiovascular;  Laterality: Left;  . RENAL BIOPSY Left 14     Current Outpatient Prescriptions  Medication Sig Dispense Refill  . albuterol (VENTOLIN HFA) 108 (90 Base) MCG/ACT inhaler Inhale 2 puffs into the lungs every 6 (six) hours as needed for wheezing or  shortness of breath.    [name] amLODipine (NORVASC) 10 MG tablet take 1 tablet by mouth once daily 30 tablet 5  . aspirin EC 81 MG tablet Take 1 tablet (81 mg total) by mouth daily. 90 tablet 3  . atorvastatin (LIPITOR) 20 MG tablet take 1 tablet by mouth at bedtime 30 tablet 6  . budesonide (PULMICORT) 0.5 MG/2ML nebulizer solution Take 2 mLs (0.5 mg total) by nebulization every 12 (twelve) hours. 60 mL 11  . cinacalcet (SENSIPAR) 30 MG tablet Take 30 mg by mouth daily.    . furosemide (LASIX) 80 MG tablet Take 80 mg by mouth daily.    . metoprolol (LOPRESSOR) 50 MG tablet take 1 tablet by mouth twice a day 60 tablet 6  . tiotropium (SPIRIVA) 18 MCG inhalation capsule Place 1 capsule (18 mcg total) into inhaler and inhale daily. 30 capsule 12  . traMADol (ULTRAM) 50 MG tablet Take by mouth.    . traZODone (DESYREL) 50 MG tablet Take 25 mg by mouth See admin instructions. Med is taking on nights pt does not have to be up for dialysis Monday Wed  Friday and Sat nights    . zolpidem (AMBIEN) 10 MG tablet Take 10 mg by mouth at bedtime as needed for sleep.     Current Facility-Administered Medications  Medication Dose Route Frequency Provider Last Rate Last Dose  . ceFAZolin (ANCEF) IVPB 1 g/50 mL premix  1 g Intravenous Once Dew, Erskine Squibb, MD        Allergies:   Patient has no known allergies.    Social History:  The patient  reports that he has been smoking Cigarettes.  He has a 15.00 pack-year smoking history. He has never used smokeless tobacco. He reports that he drinks about 1.2 oz of alcohol per week . He reports that he does not use drugs.   Family History:  The patient's family history includes Heart disease in his brother and father.    ROS:  Please see the history of present illness.   Otherwise, review of systems are positive for none.   All other systems are reviewed and negative.    PHYSICAL EXAM: VS:  BP 140/82 (BP Location: Right Arm, Patient Position: Sitting, Cuff Size: Normal)    Pulse 66   Ht 5\' 6"  (1.676 m)   Wt 156 lb 12 oz (71.1 kg)   BMI 25.30 kg/m  , BMI Body mass index is 25.3 kg/m. GEN: Well nourished, well developed, in no acute distress  HEENT: normal  Neck: no JVD, carotid bruits, or masses Cardiac: RRR; no murmurs, rubs, or gallops,no edema  Respiratory:  clear to auscultation bilaterally, normal work of breathing GI: soft, nontender, nondistended, + BS MS: no deformity or atrophy  Skin: warm and dry, no rash Neuro:  Strength and sensation are intact Psych: euthymic mood, full affect   EKG:  EKG is ordered  today. The ekg ordered today demonstrates normal sinus rhythm with PVCs. LVH with repolarization abnormalities   Recent Labs: No results found for requested labs within last 8760 hours.    Lipid Panel    Component Value Date/Time   CHOL 143 10/30/2013 0543   TRIG 62 10/30/2013 0543   HDL 65 (H) 10/30/2013 0543   VLDL 12 10/30/2013 0543   LDLCALC 66 10/30/2013 0543      Wt Readings from Last 3 Encounters:  07/07/17 156 lb 12 oz (71.1 kg)  06/28/17 160 lb (72.6 kg)  05/24/17 162 lb (73.5 kg)       No flowsheet data found.    ASSESSMENT AND PLAN:  1.  Coronary artery disease involving native coronary arteries without angina:  He is doing reasonably well with no anginal symptoms. Continue medical therapy.  2. Chronic diastolic heart failure: He appears to be euvolemic.  Fluid status is managed with dialysis.  3. Hyperlipidemia:Continue treatment with atorvastatin with a target LDL of less than 70.   4. Essential hypertension: Blood pressure is reasonably controlled on current medications.  5.  Tobacco use: I discussed with him the importance of smoking cessation.  I explained to him that he was taken off the kidney transplant list at [facility] due to this issue according to the documentation.  The patient reports being under stress.  His sister is also a heavy smoker which makes it harder for him to quit.  He is going to try  on his own and if not successful, we can consider Chantix.  Disposition:   FU with me in 6 months  Signed,  Kathlyn Sacramento, MD  07/07/2017 10:37 AM    [place]

## 2017-07-08 DIAGNOSIS — N2581 Secondary hyperparathyroidism of renal origin: Secondary | ICD-10-CM | POA: Diagnosis not present

## 2017-07-08 DIAGNOSIS — D509 Iron deficiency anemia, unspecified: Secondary | ICD-10-CM | POA: Diagnosis not present

## 2017-07-08 DIAGNOSIS — Z23 Encounter for immunization: Secondary | ICD-10-CM | POA: Diagnosis not present

## 2017-07-08 DIAGNOSIS — Z992 Dependence on renal dialysis: Secondary | ICD-10-CM | POA: Diagnosis not present

## 2017-07-08 DIAGNOSIS — N186 End stage renal disease: Secondary | ICD-10-CM | POA: Diagnosis not present

## 2017-07-08 DIAGNOSIS — D631 Anemia in chronic kidney disease: Secondary | ICD-10-CM | POA: Diagnosis not present

## 2017-07-11 DIAGNOSIS — N186 End stage renal disease: Secondary | ICD-10-CM | POA: Diagnosis not present

## 2017-07-11 DIAGNOSIS — Z23 Encounter for immunization: Secondary | ICD-10-CM | POA: Diagnosis not present

## 2017-07-11 DIAGNOSIS — Z992 Dependence on renal dialysis: Secondary | ICD-10-CM | POA: Diagnosis not present

## 2017-07-11 DIAGNOSIS — N2581 Secondary hyperparathyroidism of renal origin: Secondary | ICD-10-CM | POA: Diagnosis not present

## 2017-07-11 DIAGNOSIS — D509 Iron deficiency anemia, unspecified: Secondary | ICD-10-CM | POA: Diagnosis not present

## 2017-07-11 DIAGNOSIS — D631 Anemia in chronic kidney disease: Secondary | ICD-10-CM | POA: Diagnosis not present

## 2017-07-13 DIAGNOSIS — N2581 Secondary hyperparathyroidism of renal origin: Secondary | ICD-10-CM | POA: Diagnosis not present

## 2017-07-13 DIAGNOSIS — Z992 Dependence on renal dialysis: Secondary | ICD-10-CM | POA: Diagnosis not present

## 2017-07-13 DIAGNOSIS — Z23 Encounter for immunization: Secondary | ICD-10-CM | POA: Diagnosis not present

## 2017-07-13 DIAGNOSIS — N186 End stage renal disease: Secondary | ICD-10-CM | POA: Diagnosis not present

## 2017-07-13 DIAGNOSIS — D509 Iron deficiency anemia, unspecified: Secondary | ICD-10-CM | POA: Diagnosis not present

## 2017-07-13 DIAGNOSIS — D631 Anemia in chronic kidney disease: Secondary | ICD-10-CM | POA: Diagnosis not present

## 2017-07-14 DIAGNOSIS — N2581 Secondary hyperparathyroidism of renal origin: Secondary | ICD-10-CM | POA: Diagnosis not present

## 2017-07-14 DIAGNOSIS — D631 Anemia in chronic kidney disease: Secondary | ICD-10-CM | POA: Diagnosis not present

## 2017-07-14 DIAGNOSIS — Z992 Dependence on renal dialysis: Secondary | ICD-10-CM | POA: Diagnosis not present

## 2017-07-14 DIAGNOSIS — N186 End stage renal disease: Secondary | ICD-10-CM | POA: Diagnosis not present

## 2017-07-14 DIAGNOSIS — D509 Iron deficiency anemia, unspecified: Secondary | ICD-10-CM | POA: Diagnosis not present

## 2017-07-15 DIAGNOSIS — D509 Iron deficiency anemia, unspecified: Secondary | ICD-10-CM | POA: Diagnosis not present

## 2017-07-15 DIAGNOSIS — N186 End stage renal disease: Secondary | ICD-10-CM | POA: Diagnosis not present

## 2017-07-15 DIAGNOSIS — Z992 Dependence on renal dialysis: Secondary | ICD-10-CM | POA: Diagnosis not present

## 2017-07-15 DIAGNOSIS — D631 Anemia in chronic kidney disease: Secondary | ICD-10-CM | POA: Diagnosis not present

## 2017-07-15 DIAGNOSIS — N2581 Secondary hyperparathyroidism of renal origin: Secondary | ICD-10-CM | POA: Diagnosis not present

## 2017-07-18 DIAGNOSIS — Z992 Dependence on renal dialysis: Secondary | ICD-10-CM | POA: Diagnosis not present

## 2017-07-18 DIAGNOSIS — D631 Anemia in chronic kidney disease: Secondary | ICD-10-CM | POA: Diagnosis not present

## 2017-07-18 DIAGNOSIS — N2581 Secondary hyperparathyroidism of renal origin: Secondary | ICD-10-CM | POA: Diagnosis not present

## 2017-07-18 DIAGNOSIS — N186 End stage renal disease: Secondary | ICD-10-CM | POA: Diagnosis not present

## 2017-07-18 DIAGNOSIS — D509 Iron deficiency anemia, unspecified: Secondary | ICD-10-CM | POA: Diagnosis not present

## 2017-07-20 DIAGNOSIS — D509 Iron deficiency anemia, unspecified: Secondary | ICD-10-CM | POA: Diagnosis not present

## 2017-07-20 DIAGNOSIS — Z992 Dependence on renal dialysis: Secondary | ICD-10-CM | POA: Diagnosis not present

## 2017-07-20 DIAGNOSIS — N2581 Secondary hyperparathyroidism of renal origin: Secondary | ICD-10-CM | POA: Diagnosis not present

## 2017-07-20 DIAGNOSIS — D631 Anemia in chronic kidney disease: Secondary | ICD-10-CM | POA: Diagnosis not present

## 2017-07-20 DIAGNOSIS — N186 End stage renal disease: Secondary | ICD-10-CM | POA: Diagnosis not present

## 2017-07-22 DIAGNOSIS — Z992 Dependence on renal dialysis: Secondary | ICD-10-CM | POA: Diagnosis not present

## 2017-07-22 DIAGNOSIS — N2581 Secondary hyperparathyroidism of renal origin: Secondary | ICD-10-CM | POA: Diagnosis not present

## 2017-07-22 DIAGNOSIS — N186 End stage renal disease: Secondary | ICD-10-CM | POA: Diagnosis not present

## 2017-07-22 DIAGNOSIS — D509 Iron deficiency anemia, unspecified: Secondary | ICD-10-CM | POA: Diagnosis not present

## 2017-07-22 DIAGNOSIS — D631 Anemia in chronic kidney disease: Secondary | ICD-10-CM | POA: Diagnosis not present

## 2017-07-25 DIAGNOSIS — N2581 Secondary hyperparathyroidism of renal origin: Secondary | ICD-10-CM | POA: Diagnosis not present

## 2017-07-25 DIAGNOSIS — D631 Anemia in chronic kidney disease: Secondary | ICD-10-CM | POA: Diagnosis not present

## 2017-07-25 DIAGNOSIS — N186 End stage renal disease: Secondary | ICD-10-CM | POA: Diagnosis not present

## 2017-07-25 DIAGNOSIS — Z992 Dependence on renal dialysis: Secondary | ICD-10-CM | POA: Diagnosis not present

## 2017-07-25 DIAGNOSIS — D509 Iron deficiency anemia, unspecified: Secondary | ICD-10-CM | POA: Diagnosis not present

## 2017-07-27 DIAGNOSIS — N186 End stage renal disease: Secondary | ICD-10-CM | POA: Diagnosis not present

## 2017-07-27 DIAGNOSIS — D631 Anemia in chronic kidney disease: Secondary | ICD-10-CM | POA: Diagnosis not present

## 2017-07-27 DIAGNOSIS — Z992 Dependence on renal dialysis: Secondary | ICD-10-CM | POA: Diagnosis not present

## 2017-07-27 DIAGNOSIS — D509 Iron deficiency anemia, unspecified: Secondary | ICD-10-CM | POA: Diagnosis not present

## 2017-07-27 DIAGNOSIS — N2581 Secondary hyperparathyroidism of renal origin: Secondary | ICD-10-CM | POA: Diagnosis not present

## 2017-07-28 DIAGNOSIS — R42 Dizziness and giddiness: Secondary | ICD-10-CM | POA: Diagnosis not present

## 2017-07-29 DIAGNOSIS — D631 Anemia in chronic kidney disease: Secondary | ICD-10-CM | POA: Diagnosis not present

## 2017-07-29 DIAGNOSIS — N186 End stage renal disease: Secondary | ICD-10-CM | POA: Diagnosis not present

## 2017-07-29 DIAGNOSIS — D509 Iron deficiency anemia, unspecified: Secondary | ICD-10-CM | POA: Diagnosis not present

## 2017-07-29 DIAGNOSIS — Z992 Dependence on renal dialysis: Secondary | ICD-10-CM | POA: Diagnosis not present

## 2017-07-29 DIAGNOSIS — N2581 Secondary hyperparathyroidism of renal origin: Secondary | ICD-10-CM | POA: Diagnosis not present

## 2017-08-01 DIAGNOSIS — N2581 Secondary hyperparathyroidism of renal origin: Secondary | ICD-10-CM | POA: Diagnosis not present

## 2017-08-01 DIAGNOSIS — N186 End stage renal disease: Secondary | ICD-10-CM | POA: Diagnosis not present

## 2017-08-01 DIAGNOSIS — D631 Anemia in chronic kidney disease: Secondary | ICD-10-CM | POA: Diagnosis not present

## 2017-08-01 DIAGNOSIS — Z992 Dependence on renal dialysis: Secondary | ICD-10-CM | POA: Diagnosis not present

## 2017-08-01 DIAGNOSIS — D509 Iron deficiency anemia, unspecified: Secondary | ICD-10-CM | POA: Diagnosis not present

## 2017-08-03 DIAGNOSIS — N2581 Secondary hyperparathyroidism of renal origin: Secondary | ICD-10-CM | POA: Diagnosis not present

## 2017-08-03 DIAGNOSIS — D631 Anemia in chronic kidney disease: Secondary | ICD-10-CM | POA: Diagnosis not present

## 2017-08-03 DIAGNOSIS — N186 End stage renal disease: Secondary | ICD-10-CM | POA: Diagnosis not present

## 2017-08-03 DIAGNOSIS — D509 Iron deficiency anemia, unspecified: Secondary | ICD-10-CM | POA: Diagnosis not present

## 2017-08-03 DIAGNOSIS — Z992 Dependence on renal dialysis: Secondary | ICD-10-CM | POA: Diagnosis not present

## 2017-08-05 DIAGNOSIS — Z992 Dependence on renal dialysis: Secondary | ICD-10-CM | POA: Diagnosis not present

## 2017-08-05 DIAGNOSIS — D509 Iron deficiency anemia, unspecified: Secondary | ICD-10-CM | POA: Diagnosis not present

## 2017-08-05 DIAGNOSIS — N2581 Secondary hyperparathyroidism of renal origin: Secondary | ICD-10-CM | POA: Diagnosis not present

## 2017-08-05 DIAGNOSIS — N186 End stage renal disease: Secondary | ICD-10-CM | POA: Diagnosis not present

## 2017-08-05 DIAGNOSIS — D631 Anemia in chronic kidney disease: Secondary | ICD-10-CM | POA: Diagnosis not present

## 2017-08-08 DIAGNOSIS — N186 End stage renal disease: Secondary | ICD-10-CM | POA: Diagnosis not present

## 2017-08-08 DIAGNOSIS — N2581 Secondary hyperparathyroidism of renal origin: Secondary | ICD-10-CM | POA: Diagnosis not present

## 2017-08-08 DIAGNOSIS — Z992 Dependence on renal dialysis: Secondary | ICD-10-CM | POA: Diagnosis not present

## 2017-08-08 DIAGNOSIS — D631 Anemia in chronic kidney disease: Secondary | ICD-10-CM | POA: Diagnosis not present

## 2017-08-08 DIAGNOSIS — D509 Iron deficiency anemia, unspecified: Secondary | ICD-10-CM | POA: Diagnosis not present

## 2017-08-10 DIAGNOSIS — N2581 Secondary hyperparathyroidism of renal origin: Secondary | ICD-10-CM | POA: Diagnosis not present

## 2017-08-10 DIAGNOSIS — Z992 Dependence on renal dialysis: Secondary | ICD-10-CM | POA: Diagnosis not present

## 2017-08-10 DIAGNOSIS — D509 Iron deficiency anemia, unspecified: Secondary | ICD-10-CM | POA: Diagnosis not present

## 2017-08-10 DIAGNOSIS — N186 End stage renal disease: Secondary | ICD-10-CM | POA: Diagnosis not present

## 2017-08-10 DIAGNOSIS — D631 Anemia in chronic kidney disease: Secondary | ICD-10-CM | POA: Diagnosis not present

## 2017-08-12 DIAGNOSIS — N2581 Secondary hyperparathyroidism of renal origin: Secondary | ICD-10-CM | POA: Diagnosis not present

## 2017-08-12 DIAGNOSIS — D509 Iron deficiency anemia, unspecified: Secondary | ICD-10-CM | POA: Diagnosis not present

## 2017-08-12 DIAGNOSIS — D631 Anemia in chronic kidney disease: Secondary | ICD-10-CM | POA: Diagnosis not present

## 2017-08-12 DIAGNOSIS — N186 End stage renal disease: Secondary | ICD-10-CM | POA: Diagnosis not present

## 2017-08-12 DIAGNOSIS — Z992 Dependence on renal dialysis: Secondary | ICD-10-CM | POA: Diagnosis not present

## 2017-08-13 DIAGNOSIS — Z992 Dependence on renal dialysis: Secondary | ICD-10-CM | POA: Diagnosis not present

## 2017-08-13 DIAGNOSIS — N2581 Secondary hyperparathyroidism of renal origin: Secondary | ICD-10-CM | POA: Diagnosis not present

## 2017-08-13 DIAGNOSIS — N186 End stage renal disease: Secondary | ICD-10-CM | POA: Diagnosis not present

## 2017-08-13 DIAGNOSIS — D509 Iron deficiency anemia, unspecified: Secondary | ICD-10-CM | POA: Diagnosis not present

## 2017-08-13 DIAGNOSIS — D631 Anemia in chronic kidney disease: Secondary | ICD-10-CM | POA: Diagnosis not present

## 2017-08-15 DIAGNOSIS — N186 End stage renal disease: Secondary | ICD-10-CM | POA: Diagnosis not present

## 2017-08-15 DIAGNOSIS — D509 Iron deficiency anemia, unspecified: Secondary | ICD-10-CM | POA: Diagnosis not present

## 2017-08-15 DIAGNOSIS — D631 Anemia in chronic kidney disease: Secondary | ICD-10-CM | POA: Diagnosis not present

## 2017-08-15 DIAGNOSIS — N2581 Secondary hyperparathyroidism of renal origin: Secondary | ICD-10-CM | POA: Diagnosis not present

## 2017-08-15 DIAGNOSIS — Z992 Dependence on renal dialysis: Secondary | ICD-10-CM | POA: Diagnosis not present

## 2017-08-17 DIAGNOSIS — N2581 Secondary hyperparathyroidism of renal origin: Secondary | ICD-10-CM | POA: Diagnosis not present

## 2017-08-17 DIAGNOSIS — D509 Iron deficiency anemia, unspecified: Secondary | ICD-10-CM | POA: Diagnosis not present

## 2017-08-17 DIAGNOSIS — Z992 Dependence on renal dialysis: Secondary | ICD-10-CM | POA: Diagnosis not present

## 2017-08-17 DIAGNOSIS — D631 Anemia in chronic kidney disease: Secondary | ICD-10-CM | POA: Diagnosis not present

## 2017-08-17 DIAGNOSIS — N186 End stage renal disease: Secondary | ICD-10-CM | POA: Diagnosis not present

## 2017-08-19 DIAGNOSIS — Z992 Dependence on renal dialysis: Secondary | ICD-10-CM | POA: Diagnosis not present

## 2017-08-19 DIAGNOSIS — D509 Iron deficiency anemia, unspecified: Secondary | ICD-10-CM | POA: Diagnosis not present

## 2017-08-19 DIAGNOSIS — N2581 Secondary hyperparathyroidism of renal origin: Secondary | ICD-10-CM | POA: Diagnosis not present

## 2017-08-19 DIAGNOSIS — N186 End stage renal disease: Secondary | ICD-10-CM | POA: Diagnosis not present

## 2017-08-19 DIAGNOSIS — D631 Anemia in chronic kidney disease: Secondary | ICD-10-CM | POA: Diagnosis not present

## 2017-08-20 DIAGNOSIS — Z992 Dependence on renal dialysis: Secondary | ICD-10-CM | POA: Diagnosis not present

## 2017-08-20 DIAGNOSIS — N2581 Secondary hyperparathyroidism of renal origin: Secondary | ICD-10-CM | POA: Diagnosis not present

## 2017-08-20 DIAGNOSIS — D631 Anemia in chronic kidney disease: Secondary | ICD-10-CM | POA: Diagnosis not present

## 2017-08-20 DIAGNOSIS — D509 Iron deficiency anemia, unspecified: Secondary | ICD-10-CM | POA: Diagnosis not present

## 2017-08-20 DIAGNOSIS — N186 End stage renal disease: Secondary | ICD-10-CM | POA: Diagnosis not present

## 2017-08-23 DIAGNOSIS — N186 End stage renal disease: Secondary | ICD-10-CM | POA: Diagnosis not present

## 2017-08-23 DIAGNOSIS — Z992 Dependence on renal dialysis: Secondary | ICD-10-CM | POA: Diagnosis not present

## 2017-08-23 DIAGNOSIS — D509 Iron deficiency anemia, unspecified: Secondary | ICD-10-CM | POA: Diagnosis not present

## 2017-08-23 DIAGNOSIS — N2581 Secondary hyperparathyroidism of renal origin: Secondary | ICD-10-CM | POA: Diagnosis not present

## 2017-08-23 DIAGNOSIS — D631 Anemia in chronic kidney disease: Secondary | ICD-10-CM | POA: Diagnosis not present

## 2017-08-24 DIAGNOSIS — Z992 Dependence on renal dialysis: Secondary | ICD-10-CM | POA: Diagnosis not present

## 2017-08-24 DIAGNOSIS — N2581 Secondary hyperparathyroidism of renal origin: Secondary | ICD-10-CM | POA: Diagnosis not present

## 2017-08-24 DIAGNOSIS — D631 Anemia in chronic kidney disease: Secondary | ICD-10-CM | POA: Diagnosis not present

## 2017-08-24 DIAGNOSIS — D509 Iron deficiency anemia, unspecified: Secondary | ICD-10-CM | POA: Diagnosis not present

## 2017-08-24 DIAGNOSIS — N186 End stage renal disease: Secondary | ICD-10-CM | POA: Diagnosis not present

## 2017-08-26 DIAGNOSIS — N2581 Secondary hyperparathyroidism of renal origin: Secondary | ICD-10-CM | POA: Diagnosis not present

## 2017-08-26 DIAGNOSIS — Z992 Dependence on renal dialysis: Secondary | ICD-10-CM | POA: Diagnosis not present

## 2017-08-26 DIAGNOSIS — D509 Iron deficiency anemia, unspecified: Secondary | ICD-10-CM | POA: Diagnosis not present

## 2017-08-26 DIAGNOSIS — N186 End stage renal disease: Secondary | ICD-10-CM | POA: Diagnosis not present

## 2017-08-26 DIAGNOSIS — D631 Anemia in chronic kidney disease: Secondary | ICD-10-CM | POA: Diagnosis not present

## 2017-08-29 DIAGNOSIS — D631 Anemia in chronic kidney disease: Secondary | ICD-10-CM | POA: Diagnosis not present

## 2017-08-29 DIAGNOSIS — N186 End stage renal disease: Secondary | ICD-10-CM | POA: Diagnosis not present

## 2017-08-29 DIAGNOSIS — N2581 Secondary hyperparathyroidism of renal origin: Secondary | ICD-10-CM | POA: Diagnosis not present

## 2017-08-29 DIAGNOSIS — Z992 Dependence on renal dialysis: Secondary | ICD-10-CM | POA: Diagnosis not present

## 2017-08-29 DIAGNOSIS — D509 Iron deficiency anemia, unspecified: Secondary | ICD-10-CM | POA: Diagnosis not present

## 2017-08-31 DIAGNOSIS — D509 Iron deficiency anemia, unspecified: Secondary | ICD-10-CM | POA: Diagnosis not present

## 2017-08-31 DIAGNOSIS — D631 Anemia in chronic kidney disease: Secondary | ICD-10-CM | POA: Diagnosis not present

## 2017-08-31 DIAGNOSIS — Z992 Dependence on renal dialysis: Secondary | ICD-10-CM | POA: Diagnosis not present

## 2017-08-31 DIAGNOSIS — N186 End stage renal disease: Secondary | ICD-10-CM | POA: Diagnosis not present

## 2017-08-31 DIAGNOSIS — N2581 Secondary hyperparathyroidism of renal origin: Secondary | ICD-10-CM | POA: Diagnosis not present

## 2017-09-02 DIAGNOSIS — Z992 Dependence on renal dialysis: Secondary | ICD-10-CM | POA: Diagnosis not present

## 2017-09-02 DIAGNOSIS — D509 Iron deficiency anemia, unspecified: Secondary | ICD-10-CM | POA: Diagnosis not present

## 2017-09-02 DIAGNOSIS — D631 Anemia in chronic kidney disease: Secondary | ICD-10-CM | POA: Diagnosis not present

## 2017-09-02 DIAGNOSIS — N2581 Secondary hyperparathyroidism of renal origin: Secondary | ICD-10-CM | POA: Diagnosis not present

## 2017-09-02 DIAGNOSIS — N186 End stage renal disease: Secondary | ICD-10-CM | POA: Diagnosis not present

## 2017-09-04 DIAGNOSIS — D509 Iron deficiency anemia, unspecified: Secondary | ICD-10-CM | POA: Diagnosis not present

## 2017-09-04 DIAGNOSIS — N186 End stage renal disease: Secondary | ICD-10-CM | POA: Diagnosis not present

## 2017-09-04 DIAGNOSIS — N2581 Secondary hyperparathyroidism of renal origin: Secondary | ICD-10-CM | POA: Diagnosis not present

## 2017-09-04 DIAGNOSIS — D631 Anemia in chronic kidney disease: Secondary | ICD-10-CM | POA: Diagnosis not present

## 2017-09-04 DIAGNOSIS — Z992 Dependence on renal dialysis: Secondary | ICD-10-CM | POA: Diagnosis not present

## 2017-09-07 DIAGNOSIS — N2581 Secondary hyperparathyroidism of renal origin: Secondary | ICD-10-CM | POA: Diagnosis not present

## 2017-09-07 DIAGNOSIS — Z992 Dependence on renal dialysis: Secondary | ICD-10-CM | POA: Diagnosis not present

## 2017-09-07 DIAGNOSIS — D509 Iron deficiency anemia, unspecified: Secondary | ICD-10-CM | POA: Diagnosis not present

## 2017-09-07 DIAGNOSIS — N186 End stage renal disease: Secondary | ICD-10-CM | POA: Diagnosis not present

## 2017-09-07 DIAGNOSIS — D631 Anemia in chronic kidney disease: Secondary | ICD-10-CM | POA: Diagnosis not present

## 2017-09-09 DIAGNOSIS — Z992 Dependence on renal dialysis: Secondary | ICD-10-CM | POA: Diagnosis not present

## 2017-09-09 DIAGNOSIS — N186 End stage renal disease: Secondary | ICD-10-CM | POA: Diagnosis not present

## 2017-09-09 DIAGNOSIS — D509 Iron deficiency anemia, unspecified: Secondary | ICD-10-CM | POA: Diagnosis not present

## 2017-09-09 DIAGNOSIS — D631 Anemia in chronic kidney disease: Secondary | ICD-10-CM | POA: Diagnosis not present

## 2017-09-09 DIAGNOSIS — N2581 Secondary hyperparathyroidism of renal origin: Secondary | ICD-10-CM | POA: Diagnosis not present

## 2017-09-11 DIAGNOSIS — N186 End stage renal disease: Secondary | ICD-10-CM | POA: Diagnosis not present

## 2017-09-11 DIAGNOSIS — N2581 Secondary hyperparathyroidism of renal origin: Secondary | ICD-10-CM | POA: Diagnosis not present

## 2017-09-11 DIAGNOSIS — Z992 Dependence on renal dialysis: Secondary | ICD-10-CM | POA: Diagnosis not present

## 2017-09-11 DIAGNOSIS — D631 Anemia in chronic kidney disease: Secondary | ICD-10-CM | POA: Diagnosis not present

## 2017-09-11 DIAGNOSIS — D509 Iron deficiency anemia, unspecified: Secondary | ICD-10-CM | POA: Diagnosis not present

## 2017-09-12 DIAGNOSIS — N186 End stage renal disease: Secondary | ICD-10-CM | POA: Diagnosis not present

## 2017-09-12 DIAGNOSIS — D631 Anemia in chronic kidney disease: Secondary | ICD-10-CM | POA: Diagnosis not present

## 2017-09-12 DIAGNOSIS — Z992 Dependence on renal dialysis: Secondary | ICD-10-CM | POA: Diagnosis not present

## 2017-09-12 DIAGNOSIS — D509 Iron deficiency anemia, unspecified: Secondary | ICD-10-CM | POA: Diagnosis not present

## 2017-09-12 DIAGNOSIS — N2581 Secondary hyperparathyroidism of renal origin: Secondary | ICD-10-CM | POA: Diagnosis not present

## 2017-09-13 DIAGNOSIS — N186 End stage renal disease: Secondary | ICD-10-CM | POA: Diagnosis not present

## 2017-09-13 DIAGNOSIS — N2581 Secondary hyperparathyroidism of renal origin: Secondary | ICD-10-CM | POA: Diagnosis not present

## 2017-09-13 DIAGNOSIS — D631 Anemia in chronic kidney disease: Secondary | ICD-10-CM | POA: Diagnosis not present

## 2017-09-13 DIAGNOSIS — D509 Iron deficiency anemia, unspecified: Secondary | ICD-10-CM | POA: Diagnosis not present

## 2017-09-13 DIAGNOSIS — Z992 Dependence on renal dialysis: Secondary | ICD-10-CM | POA: Diagnosis not present

## 2017-09-14 DIAGNOSIS — N186 End stage renal disease: Secondary | ICD-10-CM | POA: Diagnosis not present

## 2017-09-14 DIAGNOSIS — N2581 Secondary hyperparathyroidism of renal origin: Secondary | ICD-10-CM | POA: Diagnosis not present

## 2017-09-14 DIAGNOSIS — D631 Anemia in chronic kidney disease: Secondary | ICD-10-CM | POA: Diagnosis not present

## 2017-09-14 DIAGNOSIS — Z992 Dependence on renal dialysis: Secondary | ICD-10-CM | POA: Diagnosis not present

## 2017-09-14 DIAGNOSIS — D509 Iron deficiency anemia, unspecified: Secondary | ICD-10-CM | POA: Diagnosis not present

## 2017-09-16 DIAGNOSIS — D631 Anemia in chronic kidney disease: Secondary | ICD-10-CM | POA: Diagnosis not present

## 2017-09-16 DIAGNOSIS — D509 Iron deficiency anemia, unspecified: Secondary | ICD-10-CM | POA: Diagnosis not present

## 2017-09-16 DIAGNOSIS — N186 End stage renal disease: Secondary | ICD-10-CM | POA: Diagnosis not present

## 2017-09-16 DIAGNOSIS — N2581 Secondary hyperparathyroidism of renal origin: Secondary | ICD-10-CM | POA: Diagnosis not present

## 2017-09-16 DIAGNOSIS — Z992 Dependence on renal dialysis: Secondary | ICD-10-CM | POA: Diagnosis not present

## 2017-09-19 DIAGNOSIS — Z992 Dependence on renal dialysis: Secondary | ICD-10-CM | POA: Diagnosis not present

## 2017-09-19 DIAGNOSIS — N186 End stage renal disease: Secondary | ICD-10-CM | POA: Diagnosis not present

## 2017-09-19 DIAGNOSIS — D509 Iron deficiency anemia, unspecified: Secondary | ICD-10-CM | POA: Diagnosis not present

## 2017-09-19 DIAGNOSIS — D631 Anemia in chronic kidney disease: Secondary | ICD-10-CM | POA: Diagnosis not present

## 2017-09-19 DIAGNOSIS — N2581 Secondary hyperparathyroidism of renal origin: Secondary | ICD-10-CM | POA: Diagnosis not present

## 2017-09-21 DIAGNOSIS — N186 End stage renal disease: Secondary | ICD-10-CM | POA: Diagnosis not present

## 2017-09-21 DIAGNOSIS — N2581 Secondary hyperparathyroidism of renal origin: Secondary | ICD-10-CM | POA: Diagnosis not present

## 2017-09-21 DIAGNOSIS — D631 Anemia in chronic kidney disease: Secondary | ICD-10-CM | POA: Diagnosis not present

## 2017-09-21 DIAGNOSIS — Z992 Dependence on renal dialysis: Secondary | ICD-10-CM | POA: Diagnosis not present

## 2017-09-21 DIAGNOSIS — D509 Iron deficiency anemia, unspecified: Secondary | ICD-10-CM | POA: Diagnosis not present

## 2017-09-23 DIAGNOSIS — N2581 Secondary hyperparathyroidism of renal origin: Secondary | ICD-10-CM | POA: Diagnosis not present

## 2017-09-23 DIAGNOSIS — D631 Anemia in chronic kidney disease: Secondary | ICD-10-CM | POA: Diagnosis not present

## 2017-09-23 DIAGNOSIS — Z992 Dependence on renal dialysis: Secondary | ICD-10-CM | POA: Diagnosis not present

## 2017-09-23 DIAGNOSIS — D509 Iron deficiency anemia, unspecified: Secondary | ICD-10-CM | POA: Diagnosis not present

## 2017-09-23 DIAGNOSIS — N186 End stage renal disease: Secondary | ICD-10-CM | POA: Diagnosis not present

## 2017-09-26 DIAGNOSIS — N2581 Secondary hyperparathyroidism of renal origin: Secondary | ICD-10-CM | POA: Diagnosis not present

## 2017-09-26 DIAGNOSIS — D509 Iron deficiency anemia, unspecified: Secondary | ICD-10-CM | POA: Diagnosis not present

## 2017-09-26 DIAGNOSIS — D631 Anemia in chronic kidney disease: Secondary | ICD-10-CM | POA: Diagnosis not present

## 2017-09-26 DIAGNOSIS — N186 End stage renal disease: Secondary | ICD-10-CM | POA: Diagnosis not present

## 2017-09-26 DIAGNOSIS — Z992 Dependence on renal dialysis: Secondary | ICD-10-CM | POA: Diagnosis not present

## 2017-09-28 DIAGNOSIS — N186 End stage renal disease: Secondary | ICD-10-CM | POA: Diagnosis not present

## 2017-09-28 DIAGNOSIS — Z992 Dependence on renal dialysis: Secondary | ICD-10-CM | POA: Diagnosis not present

## 2017-09-28 DIAGNOSIS — N2581 Secondary hyperparathyroidism of renal origin: Secondary | ICD-10-CM | POA: Diagnosis not present

## 2017-09-28 DIAGNOSIS — D509 Iron deficiency anemia, unspecified: Secondary | ICD-10-CM | POA: Diagnosis not present

## 2017-09-28 DIAGNOSIS — D631 Anemia in chronic kidney disease: Secondary | ICD-10-CM | POA: Diagnosis not present

## 2017-09-30 DIAGNOSIS — D631 Anemia in chronic kidney disease: Secondary | ICD-10-CM | POA: Diagnosis not present

## 2017-09-30 DIAGNOSIS — N186 End stage renal disease: Secondary | ICD-10-CM | POA: Diagnosis not present

## 2017-09-30 DIAGNOSIS — D509 Iron deficiency anemia, unspecified: Secondary | ICD-10-CM | POA: Diagnosis not present

## 2017-09-30 DIAGNOSIS — Z992 Dependence on renal dialysis: Secondary | ICD-10-CM | POA: Diagnosis not present

## 2017-09-30 DIAGNOSIS — N2581 Secondary hyperparathyroidism of renal origin: Secondary | ICD-10-CM | POA: Diagnosis not present

## 2017-10-03 DIAGNOSIS — D631 Anemia in chronic kidney disease: Secondary | ICD-10-CM | POA: Diagnosis not present

## 2017-10-03 DIAGNOSIS — Z992 Dependence on renal dialysis: Secondary | ICD-10-CM | POA: Diagnosis not present

## 2017-10-03 DIAGNOSIS — N186 End stage renal disease: Secondary | ICD-10-CM | POA: Diagnosis not present

## 2017-10-03 DIAGNOSIS — N2581 Secondary hyperparathyroidism of renal origin: Secondary | ICD-10-CM | POA: Diagnosis not present

## 2017-10-03 DIAGNOSIS — D509 Iron deficiency anemia, unspecified: Secondary | ICD-10-CM | POA: Diagnosis not present

## 2017-10-04 ENCOUNTER — Ambulatory Visit (INDEPENDENT_AMBULATORY_CARE_PROVIDER_SITE_OTHER): Payer: Medicare Other | Admitting: Nurse Practitioner

## 2017-10-04 ENCOUNTER — Encounter: Payer: Self-pay | Admitting: Nurse Practitioner

## 2017-10-04 ENCOUNTER — Other Ambulatory Visit: Payer: Self-pay

## 2017-10-04 VITALS — BP 185/89 | HR 69 | Temp 97.6°F | Resp 18 | Ht 66.0 in | Wt 158.0 lb

## 2017-10-04 DIAGNOSIS — J441 Chronic obstructive pulmonary disease with (acute) exacerbation: Secondary | ICD-10-CM | POA: Diagnosis not present

## 2017-10-04 DIAGNOSIS — L84 Corns and callosities: Secondary | ICD-10-CM | POA: Diagnosis not present

## 2017-10-04 DIAGNOSIS — Z7689 Persons encountering health services in other specified circumstances: Secondary | ICD-10-CM | POA: Diagnosis not present

## 2017-10-04 MED ORDER — PREDNISONE 50 MG PO TABS: 50.0000 mg | ORAL_TABLET | Freq: Every day | ORAL | 0 refills | Status: DC

## 2017-10-04 MED ORDER — AZITHROMYCIN 250 MG PO TABS: ORAL_TABLET | ORAL | 0 refills | Status: DC

## 2017-10-04 NOTE — Patient Instructions (Addendum)
Daryl Weeks, Thank you for coming in to clinic today.  1. For your foot, I am sending you to podiatry.  2. Work to cut back on your cigarettes to 1/4 packs per day.  Even 2 cigarettes less will be helpful.  3. You are having trouble breathing and the beginnings of a COPD exacerbation. - START taking prednisone 50 mg once daily for 5 days - START taking azithromycin 250 mg tablets.  Take 2 tablets today and take 1 daily for 4 days after today.  Please schedule a follow-up appointment with Cassell Smiles, AGNP. Return in about 6 weeks (around 11/15/2017) for smoking cessation, hyperlipidemia.  If you have any other questions or concerns, please feel free to call the clinic or send a message through [place]. You may also schedule an earlier appointment if necessary.  You will receive a survey after today's visit either digitally by e-mail or paper by [employer]. Your experiences and feedback matter to [place].  Please respond so we know how we are doing as we provide care for you.   Cassell Smiles, DNP, AGNP-BC Adult Gerontology Nurse Practitioner [place]

## 2017-10-04 NOTE — Progress Notes (Signed)
Subjective:    Patient ID: Daryl Weeks, male    DOB: 1960/09/25, 57 y.o.   MRN: [id number]  Daryl Weeks is a 57 y.o. male presenting on 10/04/2017 for Establish Care (corn of the left side of the great toe. Chest congestion, coughing, difficulty taking deep breath, and runny nose  x 3 days )   HPI Establish Care No past primary care doctor.  Only has used specialists since 2015 and has been recommended to obtain a PCP.  Pulmonology: Ashby Dawes Cardiology: Fletcher Anon Nephrology: [facility] M,W,F  Foot pain Pt notes he has a "corn" on lateral R great toe he would like to have cut off.  Is painful and is a minor concern for pt.  URI Pt reports new nasal congestion and drainage x 3 days that is also associated with chest congestion, increased productive cough, difficulty with getting full breath of air into his lungs.  Pt reports he has nebulizer treatments at home to help breathing and they are helping. - Denies ear pressure/pain/fullness, eye discharge, itchiness of eyes/throat, sore throat, headaches, sinus pressure. - Also denies fever, chills, sweats, nausea, vomiting, diarrhea and constipation.  Past Medical History:  Diagnosis Date  . Anemia   . Anxiety   . Arthritis   . CHF (congestive heart failure) ([place])   . Colon polyps   . COPD (chronic obstructive pulmonary disease) ([place])   . Coronary artery disease    Non-ST elevation myocardial infarction in February of 2015. In the setting of hypertensive urgency and blood loss anemia. Cardiac catheterization showed significant three-vessel coronary artery disease. EF was 55% by echo. He underwent CABG at [facility] on May 19 with LIMA to LAD, Sequential SVG to OM1 and OM2, and SVG to RCA  . End stage renal disease ([place])    m-w-f   -davida [place]  . End stage renal disease ([place])   . ETOH abuse   . GERD (gastroesophageal reflux disease)    hx  . Hyperlipidemia   . Hypertension   . IgA nephropathy   . Lower GI bleed    Due to colon polyps. Status post resection of 14 polyps  . Non-ST elevation MI (NSTEMI) ([place])   . Pneumonia    hx  . Rheumatoid arthritis ([place])   . Shortness of breath   . Sleep apnea   . Tobacco abuse   . Tobacco abuse    Past Surgical History:  Procedure Laterality Date  . A/V FISTULAGRAM Left 10/28/2016   Procedure: A/V Fistulagram;  Surgeon: [name], MD;  Location: [place] CV LAB;  Service: Cardiovascular;  Laterality: Left;  . A/V SHUNT INTERVENTION N/A 10/28/2016   Procedure: A/V Shunt Intervention;  Surgeon: [name], MD;  Location: [place] CV LAB;  Service: Cardiovascular;  Laterality: N/A;  . AV FISTULA PLACEMENT    . CARDIAC CATHETERIZATION     RCA 90% and calcified mid LAD 80% Stenosis  . CORONARY ARTERY BYPASS GRAFT N/A 11/29/2013   Procedure: CORONARY ARTERY BYPASS GRAFTING (CABG) x 4 using endoscopically harvested right saphenous vein and left internal mammary artery;  Surgeon: [name], MD;  Location: [place] OR;  Service: Open Heart Surgery;  Laterality: N/A;  . dialysis catheterr  2/15  . INTRAOPERATIVE TRANSESOPHAGEAL ECHOCARDIOGRAM N/A 11/29/2013   Procedure: INTRAOPERATIVE TRANSESOPHAGEAL ECHOCARDIOGRAM;  Surgeon: [name], MD;  Location: [facility] OR;  Service: Open Heart Surgery;  Laterality: N/A;  . PERIPHERAL VASCULAR CATHETERIZATION N/A 06/12/2015   Procedure:  A/V Shuntogram/Fistulagram;  Surgeon: [name], MD;  Location: [place] CV LAB;  Service: Cardiovascular;  Laterality: N/A;  . PERIPHERAL VASCULAR CATHETERIZATION Left 06/12/2015   Procedure: A/V Shunt Intervention;  Surgeon: [name], MD;  Location: [place] CV LAB;  Service: Cardiovascular;  Laterality: Left;  . RENAL BIOPSY Left 14   Social History   Socioeconomic History  . Marital status: Single    Spouse name: Not on file  . Number of children: Not on file  . Years of education: Not on file  . Highest education level: Not on file  Social Needs  . Financial  resource strain: Not on file  . Food insecurity - worry: Not on file  . Food insecurity - inability: Not on file  . Transportation needs - medical: Not on file  . Transportation needs - non-medical: Not on file  Occupational History  . Not on file  Tobacco Use  . Smoking status: Current Every Day Smoker    Packs/day: 0.25    Years: 30.00    Pack years: 7.50    Types: Cigarettes  . Smokeless tobacco: Never Used  . Tobacco comment: daily  Substance and Sexual Activity  . Alcohol use: Yes    Alcohol/week: 1.2 oz    Types: 2 Cans of beer per week    Comment:  less now  . Drug use: No  . Sexual activity: Not on file  Other Topics Concern  . Not on file  Social History Narrative  . Not on file   Family History  Problem Relation Age of Onset  . Heart disease Father   . Heart disease Brother   . Healthy Sister   . Stroke Neg Hx    Current Outpatient Medications on File Prior to Visit  Medication Sig  . albuterol (VENTOLIN HFA) 108 (90 Base) MCG/ACT inhaler Inhale 2 puffs into the lungs every 6 (six) hours as needed for wheezing or shortness of breath.  [name] amLODipine (NORVASC) 10 MG tablet take 1 tablet by mouth once daily  . aspirin EC 81 MG tablet Take 1 tablet (81 mg total) by mouth daily.  [name] atorvastatin (LIPITOR) 20 MG tablet take 1 tablet by mouth at bedtime  . calcium carbonate (TUMS - DOSED IN MG ELEMENTAL CALCIUM) 500 MG chewable tablet Chew 1 tablet by mouth daily.  . cinacalcet (SENSIPAR) 30 MG tablet Take 30 mg by mouth daily.  . cloNIDine (CATAPRES) 0.1 MG tablet TK 1 T PO BID FOR HIGH BP  . furosemide (LASIX) 80 MG tablet Take 80 mg by mouth daily.  [name] gabapentin (NEURONTIN) 300 MG capsule Take 300 mg by mouth 2 (two) times daily.   . irbesartan (AVAPRO) 150 MG tablet TK 1 T PO D AT NIGHT FOR HIGH BP  . lidocaine-prilocaine (EMLA) cream Apply 1 application topically as needed.  . metoprolol (LOPRESSOR) 50 MG tablet take 1 tablet by mouth twice a day  . sevelamer  carbonate (RENVELA) 800 MG tablet Take 800 mg by mouth 3 (three) times daily with meals.  . tiotropium (SPIRIVA) 18 MCG inhalation capsule Place 1 capsule (18 mcg total) into inhaler and inhale daily.  [name] zolpidem (AMBIEN) 10 MG tablet Take 10 mg by mouth at bedtime as needed for sleep.  Lorin Picket 1 GM 210 MG(Fe) tablet TK 1 T PO  TID BEFORE EACH MEAL  . budesonide (PULMICORT) 0.5 MG/2ML nebulizer solution Take 2 mLs (0.5 mg total) by nebulization every 12 (twelve) hours.  [name]  irbesartan (AVAPRO) 300 MG tablet TK 1 T PO ATN FOR HIGH BP  . traMADol (ULTRAM) 50 MG tablet Take by mouth.   Current Facility-Administered Medications on File Prior to Visit  Medication  . ceFAZolin (ANCEF) IVPB 1 g/50 mL premix    Review of Systems  Constitutional: Negative.   HENT: Positive for congestion and rhinorrhea. Negative for ear discharge and ear pain.   Eyes: Negative.   Respiratory: Positive for cough, chest tightness and shortness of breath.   Cardiovascular: Negative.   Gastrointestinal: Negative.   Endocrine: Negative.   Genitourinary: Negative.   Musculoskeletal: Positive for arthralgias.  Skin: Negative for color change, pallor, rash and wound.       Painful callus/corn on foot  Allergic/Immunologic: Negative.   Neurological: Negative.   Hematological: Negative.   Psychiatric/Behavioral: Negative.    Per HPI unless specifically indicated above      Objective:    BP (!) 185/89 (BP Location: Right Arm, Patient Position: Sitting, Cuff Size: Normal)   Pulse 69   Temp 97.6 F (36.4 C) (Oral)   Resp 18   Ht 5\' 6"  (1.676 m)   Wt 158 lb (71.7 kg)   SpO2 99%   BMI 25.50 kg/m   Wt Readings from Last 3 Encounters:  10/04/17 158 lb (71.7 kg)  07/07/17 156 lb 12 oz (71.1 kg)  06/28/17 160 lb (72.6 kg)    Physical Exam  Constitutional: He is oriented to person, place, and time. He appears well-developed and well-nourished. No distress.  HENT:  Head: Normocephalic and atraumatic.  Right  Ear: Hearing, tympanic membrane, external ear and ear canal normal.  Left Ear: Hearing, tympanic membrane, external ear and ear canal normal.  Nose: Mucosal edema and rhinorrhea present. Right sinus exhibits no maxillary sinus tenderness and no frontal sinus tenderness. Left sinus exhibits no maxillary sinus tenderness and no frontal sinus tenderness.  Mouth/Throat: Uvula is midline and mucous membranes are normal. No posterior oropharyngeal edema. Oropharyngeal exudate: clear secretions.  Eyes: Conjunctivae and EOM are normal. Pupils are equal, round, and reactive to light.  Neck: Normal range of motion. Neck supple.  Cardiovascular: Normal rate, regular rhythm, S1 normal, S2 normal, normal heart sounds and intact distal pulses.  Pulmonary/Chest: Effort normal. No accessory muscle usage. No respiratory distress. He has decreased breath sounds. He has wheezes. He has rhonchi. He has rales.  Lymphadenopathy:    He has no cervical adenopathy.  Neurological: He is alert and oriented to person, place, and time.  Skin: Skin is warm and dry.  Multiple calloused areas on heels, 1st digit MTP joints bilateral feet, and corn with sighificant thickening and hardening at 1st digit DIP joint R foot.  Psychiatric: He has a normal mood and affect. His behavior is normal.  Vitals reviewed.    Results for orders placed or performed during the hospital encounter of 10/28/16  Potassium [facility] vascular lab only)  Result Value Ref Range   Potassium [facility] vascular lab) 4.4 3.5 - 5.1      Assessment & Plan:   Problem List Items Addressed This Visit    None    Visit Diagnoses    Acute exacerbation of chronic obstructive pulmonary disease (COPD) ([place])    -  Primary Consistent with mild acute exacerbation of COPD with worsening productive cough. - No hypoxia (99% on RA), afebrile, no recent hospitalization - Continues Albuterol, Spiriva, Pulmicort (neb)  Plan: 1. Start Prednisone 50mg  x 5 day steroid  burst 2. Use albuterol q  4 hr regularly x 2-3 days. Continue maintenance inhalers 3. Start Azithromycin Z pack dosing.  250 mg x 2 today, then 250 mg x 1 daily for 4 days.  4. RTC about 1 week if not improving, otherwise strict return criteria to go to ED   Relevant Medications   predniSONE (DELTASONE) 50 MG tablet   azithromycin (ZITHROMAX) 250 MG tablet   Hard corn     Pt with hardened corn on R great toe.  Pt very dry skin on feet.    Plan: 1. Encouraged good skin care, using lotion nightly.  Advised not to use pumice stones or other scraping devices 2. Referral to podiatry for routine care and assessment of corn. 3. Followup as needed.   Relevant Orders   Ambulatory referral to Podiatry   Encounter to establish care     Previous PCP was > 5 years ago.  Has had specialist care regularly over last several years, however.  [facility] providers' records are reviewed in [facility].  Records will be requested from Dr. Candiss Norse.  Past medical, family, and surgical history reviewed w/ pt in clinic today.      Meds ordered this encounter  Medications  . predniSONE (DELTASONE) 50 MG tablet    Sig: Take 1 tablet (50 mg total) by mouth daily with breakfast.    Dispense:  5 tablet    Refill:  0    Order Specific Question:   Supervising Provider    Answer:   Olin Hauser [[id number]]  . azithromycin (ZITHROMAX) 250 MG tablet    Sig: Take one tablet twice today.  Then take 1 tablet daily for 4 days.    Dispense:  6 tablet    Refill:  0    Order Specific Question:   Supervising Provider    Answer:   Olin Hauser [[id number]]      Follow up plan: Return in about 6 weeks (around 11/15/2017) for smoking cessation, hyperlipidemia.  Cassell Smiles, DNP, AGPCNP-BC Adult Gerontology Primary Care Nurse Practitioner [facility] 10/31/2017, 7:52 AM

## 2017-10-05 DIAGNOSIS — N2581 Secondary hyperparathyroidism of renal origin: Secondary | ICD-10-CM | POA: Diagnosis not present

## 2017-10-05 DIAGNOSIS — D509 Iron deficiency anemia, unspecified: Secondary | ICD-10-CM | POA: Diagnosis not present

## 2017-10-05 DIAGNOSIS — D631 Anemia in chronic kidney disease: Secondary | ICD-10-CM | POA: Diagnosis not present

## 2017-10-05 DIAGNOSIS — N186 End stage renal disease: Secondary | ICD-10-CM | POA: Diagnosis not present

## 2017-10-05 DIAGNOSIS — Z992 Dependence on renal dialysis: Secondary | ICD-10-CM | POA: Diagnosis not present

## 2017-10-07 DIAGNOSIS — N2581 Secondary hyperparathyroidism of renal origin: Secondary | ICD-10-CM | POA: Diagnosis not present

## 2017-10-07 DIAGNOSIS — D509 Iron deficiency anemia, unspecified: Secondary | ICD-10-CM | POA: Diagnosis not present

## 2017-10-07 DIAGNOSIS — N186 End stage renal disease: Secondary | ICD-10-CM | POA: Diagnosis not present

## 2017-10-07 DIAGNOSIS — D631 Anemia in chronic kidney disease: Secondary | ICD-10-CM | POA: Diagnosis not present

## 2017-10-07 DIAGNOSIS — Z992 Dependence on renal dialysis: Secondary | ICD-10-CM | POA: Diagnosis not present

## 2017-10-10 DIAGNOSIS — D631 Anemia in chronic kidney disease: Secondary | ICD-10-CM | POA: Diagnosis not present

## 2017-10-10 DIAGNOSIS — D509 Iron deficiency anemia, unspecified: Secondary | ICD-10-CM | POA: Diagnosis not present

## 2017-10-10 DIAGNOSIS — Z992 Dependence on renal dialysis: Secondary | ICD-10-CM | POA: Diagnosis not present

## 2017-10-10 DIAGNOSIS — N2581 Secondary hyperparathyroidism of renal origin: Secondary | ICD-10-CM | POA: Diagnosis not present

## 2017-10-10 DIAGNOSIS — N186 End stage renal disease: Secondary | ICD-10-CM | POA: Diagnosis not present

## 2017-10-11 DIAGNOSIS — N186 End stage renal disease: Secondary | ICD-10-CM | POA: Diagnosis not present

## 2017-10-11 DIAGNOSIS — D631 Anemia in chronic kidney disease: Secondary | ICD-10-CM | POA: Diagnosis not present

## 2017-10-11 DIAGNOSIS — D509 Iron deficiency anemia, unspecified: Secondary | ICD-10-CM | POA: Diagnosis not present

## 2017-10-11 DIAGNOSIS — N2581 Secondary hyperparathyroidism of renal origin: Secondary | ICD-10-CM | POA: Diagnosis not present

## 2017-10-11 DIAGNOSIS — Z992 Dependence on renal dialysis: Secondary | ICD-10-CM | POA: Diagnosis not present

## 2017-10-12 DIAGNOSIS — D631 Anemia in chronic kidney disease: Secondary | ICD-10-CM | POA: Diagnosis not present

## 2017-10-12 DIAGNOSIS — N2581 Secondary hyperparathyroidism of renal origin: Secondary | ICD-10-CM | POA: Diagnosis not present

## 2017-10-12 DIAGNOSIS — Z992 Dependence on renal dialysis: Secondary | ICD-10-CM | POA: Diagnosis not present

## 2017-10-12 DIAGNOSIS — N186 End stage renal disease: Secondary | ICD-10-CM | POA: Diagnosis not present

## 2017-10-12 DIAGNOSIS — D509 Iron deficiency anemia, unspecified: Secondary | ICD-10-CM | POA: Diagnosis not present

## 2017-10-13 DIAGNOSIS — Z992 Dependence on renal dialysis: Secondary | ICD-10-CM | POA: Diagnosis not present

## 2017-10-13 DIAGNOSIS — N186 End stage renal disease: Secondary | ICD-10-CM | POA: Diagnosis not present

## 2017-10-14 DIAGNOSIS — N186 End stage renal disease: Secondary | ICD-10-CM | POA: Diagnosis not present

## 2017-10-14 DIAGNOSIS — N2581 Secondary hyperparathyroidism of renal origin: Secondary | ICD-10-CM | POA: Diagnosis not present

## 2017-10-14 DIAGNOSIS — D631 Anemia in chronic kidney disease: Secondary | ICD-10-CM | POA: Diagnosis not present

## 2017-10-14 DIAGNOSIS — Z992 Dependence on renal dialysis: Secondary | ICD-10-CM | POA: Diagnosis not present

## 2017-10-14 DIAGNOSIS — D509 Iron deficiency anemia, unspecified: Secondary | ICD-10-CM | POA: Diagnosis not present

## 2017-10-17 DIAGNOSIS — N2581 Secondary hyperparathyroidism of renal origin: Secondary | ICD-10-CM | POA: Diagnosis not present

## 2017-10-17 DIAGNOSIS — D631 Anemia in chronic kidney disease: Secondary | ICD-10-CM | POA: Diagnosis not present

## 2017-10-17 DIAGNOSIS — D509 Iron deficiency anemia, unspecified: Secondary | ICD-10-CM | POA: Diagnosis not present

## 2017-10-17 DIAGNOSIS — N186 End stage renal disease: Secondary | ICD-10-CM | POA: Diagnosis not present

## 2017-10-17 DIAGNOSIS — Z992 Dependence on renal dialysis: Secondary | ICD-10-CM | POA: Diagnosis not present

## 2017-10-19 DIAGNOSIS — N2581 Secondary hyperparathyroidism of renal origin: Secondary | ICD-10-CM | POA: Diagnosis not present

## 2017-10-19 DIAGNOSIS — Z992 Dependence on renal dialysis: Secondary | ICD-10-CM | POA: Diagnosis not present

## 2017-10-19 DIAGNOSIS — N186 End stage renal disease: Secondary | ICD-10-CM | POA: Diagnosis not present

## 2017-10-19 DIAGNOSIS — D631 Anemia in chronic kidney disease: Secondary | ICD-10-CM | POA: Diagnosis not present

## 2017-10-19 DIAGNOSIS — D509 Iron deficiency anemia, unspecified: Secondary | ICD-10-CM | POA: Diagnosis not present

## 2017-10-21 DIAGNOSIS — D509 Iron deficiency anemia, unspecified: Secondary | ICD-10-CM | POA: Diagnosis not present

## 2017-10-21 DIAGNOSIS — D631 Anemia in chronic kidney disease: Secondary | ICD-10-CM | POA: Diagnosis not present

## 2017-10-21 DIAGNOSIS — Z992 Dependence on renal dialysis: Secondary | ICD-10-CM | POA: Diagnosis not present

## 2017-10-21 DIAGNOSIS — N186 End stage renal disease: Secondary | ICD-10-CM | POA: Diagnosis not present

## 2017-10-21 DIAGNOSIS — N2581 Secondary hyperparathyroidism of renal origin: Secondary | ICD-10-CM | POA: Diagnosis not present

## 2017-10-24 DIAGNOSIS — N186 End stage renal disease: Secondary | ICD-10-CM | POA: Diagnosis not present

## 2017-10-24 DIAGNOSIS — D509 Iron deficiency anemia, unspecified: Secondary | ICD-10-CM | POA: Diagnosis not present

## 2017-10-24 DIAGNOSIS — D631 Anemia in chronic kidney disease: Secondary | ICD-10-CM | POA: Diagnosis not present

## 2017-10-24 DIAGNOSIS — N2581 Secondary hyperparathyroidism of renal origin: Secondary | ICD-10-CM | POA: Diagnosis not present

## 2017-10-24 DIAGNOSIS — Z992 Dependence on renal dialysis: Secondary | ICD-10-CM | POA: Diagnosis not present

## 2017-10-26 DIAGNOSIS — Z992 Dependence on renal dialysis: Secondary | ICD-10-CM | POA: Diagnosis not present

## 2017-10-26 DIAGNOSIS — D631 Anemia in chronic kidney disease: Secondary | ICD-10-CM | POA: Diagnosis not present

## 2017-10-26 DIAGNOSIS — D509 Iron deficiency anemia, unspecified: Secondary | ICD-10-CM | POA: Diagnosis not present

## 2017-10-26 DIAGNOSIS — N2581 Secondary hyperparathyroidism of renal origin: Secondary | ICD-10-CM | POA: Diagnosis not present

## 2017-10-26 DIAGNOSIS — N186 End stage renal disease: Secondary | ICD-10-CM | POA: Diagnosis not present

## 2017-10-28 DIAGNOSIS — N186 End stage renal disease: Secondary | ICD-10-CM | POA: Diagnosis not present

## 2017-10-28 DIAGNOSIS — Z992 Dependence on renal dialysis: Secondary | ICD-10-CM | POA: Diagnosis not present

## 2017-10-28 DIAGNOSIS — D631 Anemia in chronic kidney disease: Secondary | ICD-10-CM | POA: Diagnosis not present

## 2017-10-28 DIAGNOSIS — N2581 Secondary hyperparathyroidism of renal origin: Secondary | ICD-10-CM | POA: Diagnosis not present

## 2017-10-28 DIAGNOSIS — D509 Iron deficiency anemia, unspecified: Secondary | ICD-10-CM | POA: Diagnosis not present

## 2017-10-31 ENCOUNTER — Encounter: Payer: Self-pay | Admitting: Nurse Practitioner

## 2017-10-31 ENCOUNTER — Ambulatory Visit: Payer: Medicare Other | Admitting: Podiatry

## 2017-10-31 DIAGNOSIS — D631 Anemia in chronic kidney disease: Secondary | ICD-10-CM | POA: Diagnosis not present

## 2017-10-31 DIAGNOSIS — Z992 Dependence on renal dialysis: Secondary | ICD-10-CM | POA: Diagnosis not present

## 2017-10-31 DIAGNOSIS — D509 Iron deficiency anemia, unspecified: Secondary | ICD-10-CM | POA: Diagnosis not present

## 2017-10-31 DIAGNOSIS — N186 End stage renal disease: Secondary | ICD-10-CM | POA: Diagnosis not present

## 2017-10-31 DIAGNOSIS — N2581 Secondary hyperparathyroidism of renal origin: Secondary | ICD-10-CM | POA: Diagnosis not present

## 2017-11-02 DIAGNOSIS — D631 Anemia in chronic kidney disease: Secondary | ICD-10-CM | POA: Diagnosis not present

## 2017-11-02 DIAGNOSIS — N2581 Secondary hyperparathyroidism of renal origin: Secondary | ICD-10-CM | POA: Diagnosis not present

## 2017-11-02 DIAGNOSIS — D509 Iron deficiency anemia, unspecified: Secondary | ICD-10-CM | POA: Diagnosis not present

## 2017-11-02 DIAGNOSIS — N186 End stage renal disease: Secondary | ICD-10-CM | POA: Diagnosis not present

## 2017-11-02 DIAGNOSIS — Z992 Dependence on renal dialysis: Secondary | ICD-10-CM | POA: Diagnosis not present

## 2017-11-04 DIAGNOSIS — Z992 Dependence on renal dialysis: Secondary | ICD-10-CM | POA: Diagnosis not present

## 2017-11-04 DIAGNOSIS — D631 Anemia in chronic kidney disease: Secondary | ICD-10-CM | POA: Diagnosis not present

## 2017-11-04 DIAGNOSIS — D509 Iron deficiency anemia, unspecified: Secondary | ICD-10-CM | POA: Diagnosis not present

## 2017-11-04 DIAGNOSIS — N2581 Secondary hyperparathyroidism of renal origin: Secondary | ICD-10-CM | POA: Diagnosis not present

## 2017-11-04 DIAGNOSIS — N186 End stage renal disease: Secondary | ICD-10-CM | POA: Diagnosis not present

## 2017-11-07 DIAGNOSIS — D631 Anemia in chronic kidney disease: Secondary | ICD-10-CM | POA: Diagnosis not present

## 2017-11-07 DIAGNOSIS — D509 Iron deficiency anemia, unspecified: Secondary | ICD-10-CM | POA: Diagnosis not present

## 2017-11-07 DIAGNOSIS — N186 End stage renal disease: Secondary | ICD-10-CM | POA: Diagnosis not present

## 2017-11-07 DIAGNOSIS — N2581 Secondary hyperparathyroidism of renal origin: Secondary | ICD-10-CM | POA: Diagnosis not present

## 2017-11-07 DIAGNOSIS — Z992 Dependence on renal dialysis: Secondary | ICD-10-CM | POA: Diagnosis not present

## 2017-11-09 DIAGNOSIS — N2581 Secondary hyperparathyroidism of renal origin: Secondary | ICD-10-CM | POA: Diagnosis not present

## 2017-11-09 DIAGNOSIS — Z992 Dependence on renal dialysis: Secondary | ICD-10-CM | POA: Diagnosis not present

## 2017-11-09 DIAGNOSIS — N186 End stage renal disease: Secondary | ICD-10-CM | POA: Diagnosis not present

## 2017-11-09 DIAGNOSIS — D631 Anemia in chronic kidney disease: Secondary | ICD-10-CM | POA: Diagnosis not present

## 2017-11-09 DIAGNOSIS — D509 Iron deficiency anemia, unspecified: Secondary | ICD-10-CM | POA: Diagnosis not present

## 2017-11-10 DIAGNOSIS — N186 End stage renal disease: Secondary | ICD-10-CM | POA: Diagnosis not present

## 2017-11-10 DIAGNOSIS — D509 Iron deficiency anemia, unspecified: Secondary | ICD-10-CM | POA: Diagnosis not present

## 2017-11-10 DIAGNOSIS — N2581 Secondary hyperparathyroidism of renal origin: Secondary | ICD-10-CM | POA: Diagnosis not present

## 2017-11-10 DIAGNOSIS — Z992 Dependence on renal dialysis: Secondary | ICD-10-CM | POA: Diagnosis not present

## 2017-11-10 DIAGNOSIS — D631 Anemia in chronic kidney disease: Secondary | ICD-10-CM | POA: Diagnosis not present

## 2017-11-11 DIAGNOSIS — D631 Anemia in chronic kidney disease: Secondary | ICD-10-CM | POA: Diagnosis not present

## 2017-11-11 DIAGNOSIS — D509 Iron deficiency anemia, unspecified: Secondary | ICD-10-CM | POA: Diagnosis not present

## 2017-11-11 DIAGNOSIS — N186 End stage renal disease: Secondary | ICD-10-CM | POA: Diagnosis not present

## 2017-11-11 DIAGNOSIS — N2581 Secondary hyperparathyroidism of renal origin: Secondary | ICD-10-CM | POA: Diagnosis not present

## 2017-11-11 DIAGNOSIS — Z992 Dependence on renal dialysis: Secondary | ICD-10-CM | POA: Diagnosis not present

## 2017-11-14 DIAGNOSIS — N186 End stage renal disease: Secondary | ICD-10-CM | POA: Diagnosis not present

## 2017-11-14 DIAGNOSIS — D509 Iron deficiency anemia, unspecified: Secondary | ICD-10-CM | POA: Diagnosis not present

## 2017-11-14 DIAGNOSIS — N2581 Secondary hyperparathyroidism of renal origin: Secondary | ICD-10-CM | POA: Diagnosis not present

## 2017-11-14 DIAGNOSIS — Z992 Dependence on renal dialysis: Secondary | ICD-10-CM | POA: Diagnosis not present

## 2017-11-14 DIAGNOSIS — D631 Anemia in chronic kidney disease: Secondary | ICD-10-CM | POA: Diagnosis not present

## 2017-11-15 ENCOUNTER — Encounter: Payer: Self-pay | Admitting: Nurse Practitioner

## 2017-11-15 ENCOUNTER — Ambulatory Visit (INDEPENDENT_AMBULATORY_CARE_PROVIDER_SITE_OTHER): Payer: Medicare Other | Admitting: Nurse Practitioner

## 2017-11-15 ENCOUNTER — Other Ambulatory Visit: Payer: Self-pay

## 2017-11-15 VITALS — BP 136/72 | HR 66 | Temp 98.4°F | Ht 66.0 in | Wt 159.2 lb

## 2017-11-15 DIAGNOSIS — J431 Panlobular emphysema: Secondary | ICD-10-CM | POA: Diagnosis not present

## 2017-11-15 DIAGNOSIS — G4709 Other insomnia: Secondary | ICD-10-CM

## 2017-11-15 DIAGNOSIS — Z131 Encounter for screening for diabetes mellitus: Secondary | ICD-10-CM | POA: Diagnosis not present

## 2017-11-15 DIAGNOSIS — E785 Hyperlipidemia, unspecified: Secondary | ICD-10-CM

## 2017-11-15 DIAGNOSIS — K5909 Other constipation: Secondary | ICD-10-CM | POA: Diagnosis not present

## 2017-11-15 DIAGNOSIS — Z720 Tobacco use: Secondary | ICD-10-CM | POA: Diagnosis not present

## 2017-11-15 DIAGNOSIS — I10 Essential (primary) hypertension: Secondary | ICD-10-CM | POA: Diagnosis not present

## 2017-11-15 DIAGNOSIS — R799 Abnormal finding of blood chemistry, unspecified: Secondary | ICD-10-CM | POA: Diagnosis not present

## 2017-11-15 NOTE — Progress Notes (Signed)
Subjective:    Patient ID: JES COSTALES, male    DOB: 03/12/1961, 57 y.o.   MRN: [id number]  LAWAYNE HARTIG is a 57 y.o. male presenting on 11/15/2017 for COPD (smoking cessation)   HPI COPD  Pt is managed by Dr. Ashby Dawes at [facility].  He notes he still has shortness of breath and has episodes that occasionally require use of his nebulizer.  He always carries his inhaler and also occasionally uses this. He notes he has not been taking his Spiriva because he cannot get the inhaler to work properly.  Smoking Cessation Pt has tried to quit in past and would like to quit, but states that smoking is a stress reliever and any time something comes up he starts back.   - Not ready at this time to quit.  Hyperlipidemia Atorvastatin 20 mg once daily - no recent lab (2015) - Continued to be prescribed by Dr. Candiss Norse since 2015.  Pt has continued to take medication daily.  Hypertension - He is checking BP at outside of clinic at Dialysis.  Readings occasionally > 180 SBP.  He continues to have management of HTN by Cardiology Fletcher Anon) and Nephrology Candiss Norse). - Current medications: amlodipine 10 mg daily, clonidine 0.1 mg twice daily (scheduled and takes 2 doses daily), irbesartan 150 mg daily, metoprolol 50 mg bid tolerating well without side effects - He is not currently symptomatic.  However, he does report several episodes of diaphoresis, lightheadedness, no known changes in heart rate but maybe faster.  He reports no increased activity level or stress as precipitating factors.  He did take an extra 81 mg aspirin tablet and notes symptoms resolved within about 10 minutes of onset.   No new medications in last month. - Pt currently denies headache, lightheadedness, dizziness, changes in vision, chest tightness/pressure, palpitations, leg swelling, sudden loss of speech or loss of consciousness. - He  reports no regular exercise routine. - His diet is moderate in salt, moderate in fat, and  moderate in carbohydrates.  Insomnia Takes zolpidem which was previously prescribed by Dr. Candiss Norse and was increased to 10 mg once daily by Dr. Fletcher Anon.  Pt was still reporting difficulty sleeping with some sleep walking and has taken a drug holiday of about 2 weeks per my recommendations at last visit.  Now notes he has improvement in sleep again back on medication at 10 mg once daily.  Social History   Tobacco Use  . Smoking status: Current Every Day Smoker    Packs/day: 0.50    Years: 32.00    Pack years: 16.00    Types: Cigarettes  . Smokeless tobacco: Never Used  . Tobacco comment: daily  Substance Use Topics  . Alcohol use: Yes    Alcohol/week: 3.6 oz    Types: 6 Cans of beer per week  . Drug use: No    Review of Systems Per HPI unless specifically indicated above     Objective:    BP 136/72 (BP Location: Right Arm, Patient Position: Sitting, Cuff Size: Normal)   Pulse 66   Temp 98.4 F (36.9 C) (Oral)   Ht 5\' 6"  (1.676 m)   Wt 159 lb 3.2 oz (72.2 kg)   SpO2 99%   BMI 25.70 kg/m   Wt Readings from Last 3 Encounters:  11/15/17 159 lb 3.2 oz (72.2 kg)  10/04/17 158 lb (71.7 kg)  07/07/17 156 lb 12 oz (71.1 kg)    Physical Exam  General - healthy weight, chronically  ill-appearing, NAD HEENT - Normocephalic, atraumatic Neck - supple, non-tender, no LAD, no thyromegaly, no carotid bruit Heart - RRR, no murmurs heard Lungs - Diminished air movement throughout all lobes with scattered rhonchi. No wheezing, crackles, or rales. Normal work of breathing. Extremeties - non-tender, no edema, cap refill < 2 seconds, peripheral pulses intact +2 bilaterally Skin - warm, dry Neuro - awake, alert, oriented x3, normal gait Psych - Normal mood and affect, normal behavior    Results for orders placed or performed in visit on 11/15/17  Lipid panel  Result Value Ref Range   Cholesterol 108 <200 mg/dL   HDL 71 >40 mg/dL   Triglycerides 31 <150 mg/dL   LDL Cholesterol (Calc) 28  mg/dL (calc)   Total CHOL/HDL Ratio 1.5 <5.0 (calc)   Non-HDL Cholesterol (Calc) 37 <130 mg/dL (calc)  Hemoglobin A1c  Result Value Ref Range   Hgb A1c MFr Bld 5.1 <5.7 % of total Hgb   Mean Plasma Glucose 100 (calc)   eAG (mmol/L) 5.5 (calc)      Assessment & Plan:   Problem List Items Addressed This Visit      Cardiovascular and Mediastinum   Hypertension Uncontrolled hypertension with BP above goal < 130/80.  Pt is not currently working on lifestyle modifications as he is still smoking and eating regular diet.  Taking medications tolerating well without side effects. Complicated by CAD, hyperlipidemia.  Plan: 1. Continue taking irbesartan, furosemide, clonidine, amlodipine, metoprolol as prescribed. 2. Obtain labs CMP, lipid, A1c  3. Encouraged heart healthy diet and increasing exercise to 30 minutes most days of the week. 4. Check BP 1-2 x per week at home, keep log, and bring to clinic at next appointment. 5. Follow up 3 months.       Respiratory   COPD (chronic obstructive pulmonary disease) ([place]) Uncontrolled COPD without adequate use of inhalers.  Pt is unable to use his Spiriva inhaler.    Plan: 1. Continue management w/ Dr. Juanell Fairly 2. Encouraged pt to bring Spiriva inhaler to clinic for instructions. 3. Continue albuterol and spiriva.    4. Followup as needed.     Other   Hyperlipidemia - Primary Status unknown as pt has not had recent lipid panel.  Takes atorvastatin and is currently tolerating well.  Plan: 1. Continue management of hyperlipidemia for ASCVD prevention with statin. 2. Recheck fasting lipid panel. 3. Followup 3 months.   Relevant Orders   Lipid panel (Completed)    Other Visit Diagnoses    Screening for diabetes mellitus (DM)       Relevant Orders   Hemoglobin A1c (Completed)   Abnormal blood chemistry       Relevant Orders   Hemoglobin A1c (Completed)  Pt with elevated glucose in past.  No check of A1c.  Evaluate today with labs in setting  of CAD and hyperlipidemia.    Declined smoking cessation     Pt not yet ready to quit.  Offered services through [facility] once patient is ready.  Desires to followup for this in about 2 months per pt preference.    Other insomnia     Chronic insomnia that is currently stable.  Continue prn ambien.  Discussed sleep hygiene as well.      Other constipation     Pt reports regular constipation.  Encouraged pt to have adequate fiber and water intake.  May take prn colace if no BM in > 3 days.       Follow up plan: Return  in about 3 months (around 02/15/2018) for Hyperlipidemia and smoking cessation AND in 1-2 months with Tiffany for AWV.  Cassell Smiles, DNP, AGPCNP-BC Adult Gerontology Primary Care Nurse Practitioner [facility] 12/05/2017, 1:58 PM

## 2017-11-15 NOTE — Patient Instructions (Addendum)
Daryl Weeks, Thank you for coming in to clinic today.  1. Keep track of your sweats.  You may need to see your cardiologist very soon to examine this again.  There are many causes of this, so please track when they occur, how long they last, and if there was anything else going on at that time (stress, not eating, eating lots of sugar).  2. Constipation: - Avoid using Milk of Magnesia or Laxative pills (Dulcolax) - START using Miralax 1/2 dose once daily.  This is a bulk forming laxative that increases bulk and keeps water in your intestines.  3. Bring your purple inhaler to clinic any time for instructions on how to use it.  Please schedule a follow-up appointment with Cassell Smiles, AGNP. Return in about 3 months (around 02/15/2018) for Hyperlipidemia and smoking cessation AND in 1-2 months with Tiffany for AWV.  If you have any other questions or concerns, please feel free to call the clinic or send a message through [facility]. You may also schedule an earlier appointment if necessary.  You will receive a survey after today's visit either digitally by e-mail or paper by [employer]. Your experiences and feedback matter to [place].  Please respond so we know how we are doing as we provide care for you.   Cassell Smiles, DNP, AGNP-BC Adult Gerontology Nurse Practitioner [facility]   Constipation, Adult Constipation is when a person has fewer bowel movements in a week than normal, has difficulty having a bowel movement, or has stools that are dry, hard, or larger than normal. Constipation may be caused by an underlying condition. It may become worse with age if a person takes certain medicines and does not take in enough fluids. Follow these instructions at home: Eating and drinking   Eat foods that have a lot of fiber, such as fresh fruits and vegetables, whole grains, and beans.  Limit foods that are high in fat, low in fiber, or overly processed, such as french fries,  hamburgers, cookies, candies, and soda.  General instructions Exercise regularly or as told by your health care provider.  Go to the restroom when you have the urge to go. Do not hold it in.  Take over-the-counter and prescription medicines only as told by your health care provider. These include any fiber supplements.  Practice pelvic floor retraining exercises, such as deep breathing while relaxing the lower abdomen and pelvic floor relaxation during bowel movements.  Watch your condition for any changes.  Keep all follow-up visits as told by your health care provider. This is important. Contact a health care provider if:  You have pain that gets worse.  You have a fever.  You do not have a bowel movement after 4 days.  You vomit.  You are not hungry.  You lose weight.  You are bleeding from the anus.  You have thin, pencil-like stools. Get help right away if:  You have a fever and your symptoms suddenly get worse.  You leak stool or have blood in your stool.  Your abdomen is bloated.  You have severe pain in your abdomen.  You feel dizzy or you faint. This information is not intended to replace advice given to you by your health care provider. Make sure you discuss any questions you have with your health care provider. Document Released: 05/28/2004 Document Revised: 03/19/2016 Document Reviewed: 02/18/2016 Elsevier Interactive Patient Education  2018 [employer].

## 2017-11-16 DIAGNOSIS — D509 Iron deficiency anemia, unspecified: Secondary | ICD-10-CM | POA: Diagnosis not present

## 2017-11-16 DIAGNOSIS — N2581 Secondary hyperparathyroidism of renal origin: Secondary | ICD-10-CM | POA: Diagnosis not present

## 2017-11-16 DIAGNOSIS — N186 End stage renal disease: Secondary | ICD-10-CM | POA: Diagnosis not present

## 2017-11-16 DIAGNOSIS — D631 Anemia in chronic kidney disease: Secondary | ICD-10-CM | POA: Diagnosis not present

## 2017-11-16 DIAGNOSIS — Z992 Dependence on renal dialysis: Secondary | ICD-10-CM | POA: Diagnosis not present

## 2017-11-16 LAB — LIPID PANEL
Cholesterol: 108 mg/dL (ref <200)
HDL: 71 mg/dL (ref >40)
LDL Cholesterol (Calc): 28 mg/dL (calc)
Non-HDL Cholesterol (Calc): 37 mg/dL (calc) (ref <130)
Total CHOL/HDL Ratio: 1.5 (calc) (ref <5.0)
Triglycerides: 31 mg/dL (ref <150)

## 2017-11-16 LAB — HEMOGLOBIN A1C
Hgb A1c MFr Bld: 5.1 % of total Hgb (ref <5.7)
Mean Plasma Glucose: 100 (calc)
eAG (mmol/L): 5.5 (calc)

## 2017-11-18 DIAGNOSIS — Z992 Dependence on renal dialysis: Secondary | ICD-10-CM | POA: Diagnosis not present

## 2017-11-18 DIAGNOSIS — N2581 Secondary hyperparathyroidism of renal origin: Secondary | ICD-10-CM | POA: Diagnosis not present

## 2017-11-18 DIAGNOSIS — D631 Anemia in chronic kidney disease: Secondary | ICD-10-CM | POA: Diagnosis not present

## 2017-11-18 DIAGNOSIS — N186 End stage renal disease: Secondary | ICD-10-CM | POA: Diagnosis not present

## 2017-11-18 DIAGNOSIS — D509 Iron deficiency anemia, unspecified: Secondary | ICD-10-CM | POA: Diagnosis not present

## 2017-11-21 DIAGNOSIS — D631 Anemia in chronic kidney disease: Secondary | ICD-10-CM | POA: Diagnosis not present

## 2017-11-21 DIAGNOSIS — N2581 Secondary hyperparathyroidism of renal origin: Secondary | ICD-10-CM | POA: Diagnosis not present

## 2017-11-21 DIAGNOSIS — Z992 Dependence on renal dialysis: Secondary | ICD-10-CM | POA: Diagnosis not present

## 2017-11-21 DIAGNOSIS — D509 Iron deficiency anemia, unspecified: Secondary | ICD-10-CM | POA: Diagnosis not present

## 2017-11-21 DIAGNOSIS — N186 End stage renal disease: Secondary | ICD-10-CM | POA: Diagnosis not present

## 2017-11-23 DIAGNOSIS — N186 End stage renal disease: Secondary | ICD-10-CM | POA: Diagnosis not present

## 2017-11-23 DIAGNOSIS — D631 Anemia in chronic kidney disease: Secondary | ICD-10-CM | POA: Diagnosis not present

## 2017-11-23 DIAGNOSIS — N2581 Secondary hyperparathyroidism of renal origin: Secondary | ICD-10-CM | POA: Diagnosis not present

## 2017-11-23 DIAGNOSIS — Z992 Dependence on renal dialysis: Secondary | ICD-10-CM | POA: Diagnosis not present

## 2017-11-23 DIAGNOSIS — D509 Iron deficiency anemia, unspecified: Secondary | ICD-10-CM | POA: Diagnosis not present

## 2017-11-25 DIAGNOSIS — N2581 Secondary hyperparathyroidism of renal origin: Secondary | ICD-10-CM | POA: Diagnosis not present

## 2017-11-25 DIAGNOSIS — N186 End stage renal disease: Secondary | ICD-10-CM | POA: Diagnosis not present

## 2017-11-25 DIAGNOSIS — D509 Iron deficiency anemia, unspecified: Secondary | ICD-10-CM | POA: Diagnosis not present

## 2017-11-25 DIAGNOSIS — Z992 Dependence on renal dialysis: Secondary | ICD-10-CM | POA: Diagnosis not present

## 2017-11-25 DIAGNOSIS — D631 Anemia in chronic kidney disease: Secondary | ICD-10-CM | POA: Diagnosis not present

## 2017-11-28 DIAGNOSIS — D631 Anemia in chronic kidney disease: Secondary | ICD-10-CM | POA: Diagnosis not present

## 2017-11-28 DIAGNOSIS — Z992 Dependence on renal dialysis: Secondary | ICD-10-CM | POA: Diagnosis not present

## 2017-11-28 DIAGNOSIS — N2581 Secondary hyperparathyroidism of renal origin: Secondary | ICD-10-CM | POA: Diagnosis not present

## 2017-11-28 DIAGNOSIS — N186 End stage renal disease: Secondary | ICD-10-CM | POA: Diagnosis not present

## 2017-11-28 DIAGNOSIS — D509 Iron deficiency anemia, unspecified: Secondary | ICD-10-CM | POA: Diagnosis not present

## 2017-11-29 ENCOUNTER — Ambulatory Visit: Payer: Medicare Other

## 2017-11-29 DIAGNOSIS — N186 End stage renal disease: Secondary | ICD-10-CM | POA: Diagnosis not present

## 2017-11-29 DIAGNOSIS — Z992 Dependence on renal dialysis: Secondary | ICD-10-CM | POA: Diagnosis not present

## 2017-11-29 DIAGNOSIS — E8770 Fluid overload, unspecified: Secondary | ICD-10-CM | POA: Diagnosis not present

## 2017-11-30 DIAGNOSIS — Z992 Dependence on renal dialysis: Secondary | ICD-10-CM | POA: Diagnosis not present

## 2017-11-30 DIAGNOSIS — N186 End stage renal disease: Secondary | ICD-10-CM | POA: Diagnosis not present

## 2017-11-30 DIAGNOSIS — D631 Anemia in chronic kidney disease: Secondary | ICD-10-CM | POA: Diagnosis not present

## 2017-11-30 DIAGNOSIS — N2581 Secondary hyperparathyroidism of renal origin: Secondary | ICD-10-CM | POA: Diagnosis not present

## 2017-11-30 DIAGNOSIS — D509 Iron deficiency anemia, unspecified: Secondary | ICD-10-CM | POA: Diagnosis not present

## 2017-12-02 DIAGNOSIS — D631 Anemia in chronic kidney disease: Secondary | ICD-10-CM | POA: Diagnosis not present

## 2017-12-02 DIAGNOSIS — N186 End stage renal disease: Secondary | ICD-10-CM | POA: Diagnosis not present

## 2017-12-02 DIAGNOSIS — Z992 Dependence on renal dialysis: Secondary | ICD-10-CM | POA: Diagnosis not present

## 2017-12-02 DIAGNOSIS — D509 Iron deficiency anemia, unspecified: Secondary | ICD-10-CM | POA: Diagnosis not present

## 2017-12-02 DIAGNOSIS — N2581 Secondary hyperparathyroidism of renal origin: Secondary | ICD-10-CM | POA: Diagnosis not present

## 2017-12-05 ENCOUNTER — Encounter: Payer: Self-pay | Admitting: Nurse Practitioner

## 2017-12-05 DIAGNOSIS — D631 Anemia in chronic kidney disease: Secondary | ICD-10-CM | POA: Diagnosis not present

## 2017-12-05 DIAGNOSIS — N186 End stage renal disease: Secondary | ICD-10-CM | POA: Diagnosis not present

## 2017-12-05 DIAGNOSIS — J441 Chronic obstructive pulmonary disease with (acute) exacerbation: Secondary | ICD-10-CM | POA: Insufficient documentation

## 2017-12-05 DIAGNOSIS — N2581 Secondary hyperparathyroidism of renal origin: Secondary | ICD-10-CM | POA: Diagnosis not present

## 2017-12-05 DIAGNOSIS — D509 Iron deficiency anemia, unspecified: Secondary | ICD-10-CM | POA: Diagnosis not present

## 2017-12-05 DIAGNOSIS — J449 Chronic obstructive pulmonary disease, unspecified: Secondary | ICD-10-CM | POA: Insufficient documentation

## 2017-12-05 DIAGNOSIS — Z992 Dependence on renal dialysis: Secondary | ICD-10-CM | POA: Diagnosis not present

## 2017-12-07 DIAGNOSIS — D631 Anemia in chronic kidney disease: Secondary | ICD-10-CM | POA: Diagnosis not present

## 2017-12-07 DIAGNOSIS — D509 Iron deficiency anemia, unspecified: Secondary | ICD-10-CM | POA: Diagnosis not present

## 2017-12-07 DIAGNOSIS — N186 End stage renal disease: Secondary | ICD-10-CM | POA: Diagnosis not present

## 2017-12-07 DIAGNOSIS — N2581 Secondary hyperparathyroidism of renal origin: Secondary | ICD-10-CM | POA: Diagnosis not present

## 2017-12-07 DIAGNOSIS — Z992 Dependence on renal dialysis: Secondary | ICD-10-CM | POA: Diagnosis not present

## 2017-12-09 DIAGNOSIS — D631 Anemia in chronic kidney disease: Secondary | ICD-10-CM | POA: Diagnosis not present

## 2017-12-09 DIAGNOSIS — D509 Iron deficiency anemia, unspecified: Secondary | ICD-10-CM | POA: Diagnosis not present

## 2017-12-09 DIAGNOSIS — N2581 Secondary hyperparathyroidism of renal origin: Secondary | ICD-10-CM | POA: Diagnosis not present

## 2017-12-09 DIAGNOSIS — N186 End stage renal disease: Secondary | ICD-10-CM | POA: Diagnosis not present

## 2017-12-09 DIAGNOSIS — Z992 Dependence on renal dialysis: Secondary | ICD-10-CM | POA: Diagnosis not present

## 2017-12-11 DIAGNOSIS — N186 End stage renal disease: Secondary | ICD-10-CM | POA: Diagnosis not present

## 2017-12-11 DIAGNOSIS — Z992 Dependence on renal dialysis: Secondary | ICD-10-CM | POA: Diagnosis not present

## 2017-12-12 DIAGNOSIS — D509 Iron deficiency anemia, unspecified: Secondary | ICD-10-CM | POA: Diagnosis not present

## 2017-12-12 DIAGNOSIS — D631 Anemia in chronic kidney disease: Secondary | ICD-10-CM | POA: Diagnosis not present

## 2017-12-12 DIAGNOSIS — Z992 Dependence on renal dialysis: Secondary | ICD-10-CM | POA: Diagnosis not present

## 2017-12-12 DIAGNOSIS — N2581 Secondary hyperparathyroidism of renal origin: Secondary | ICD-10-CM | POA: Diagnosis not present

## 2017-12-12 DIAGNOSIS — N186 End stage renal disease: Secondary | ICD-10-CM | POA: Diagnosis not present

## 2017-12-14 DIAGNOSIS — D631 Anemia in chronic kidney disease: Secondary | ICD-10-CM | POA: Diagnosis not present

## 2017-12-14 DIAGNOSIS — N186 End stage renal disease: Secondary | ICD-10-CM | POA: Diagnosis not present

## 2017-12-14 DIAGNOSIS — Z992 Dependence on renal dialysis: Secondary | ICD-10-CM | POA: Diagnosis not present

## 2017-12-14 DIAGNOSIS — N2581 Secondary hyperparathyroidism of renal origin: Secondary | ICD-10-CM | POA: Diagnosis not present

## 2017-12-14 DIAGNOSIS — D509 Iron deficiency anemia, unspecified: Secondary | ICD-10-CM | POA: Diagnosis not present

## 2017-12-16 DIAGNOSIS — D509 Iron deficiency anemia, unspecified: Secondary | ICD-10-CM | POA: Diagnosis not present

## 2017-12-16 DIAGNOSIS — Z992 Dependence on renal dialysis: Secondary | ICD-10-CM | POA: Diagnosis not present

## 2017-12-16 DIAGNOSIS — N2581 Secondary hyperparathyroidism of renal origin: Secondary | ICD-10-CM | POA: Diagnosis not present

## 2017-12-16 DIAGNOSIS — N186 End stage renal disease: Secondary | ICD-10-CM | POA: Diagnosis not present

## 2017-12-16 DIAGNOSIS — D631 Anemia in chronic kidney disease: Secondary | ICD-10-CM | POA: Diagnosis not present

## 2017-12-19 DIAGNOSIS — D509 Iron deficiency anemia, unspecified: Secondary | ICD-10-CM | POA: Diagnosis not present

## 2017-12-19 DIAGNOSIS — N2581 Secondary hyperparathyroidism of renal origin: Secondary | ICD-10-CM | POA: Diagnosis not present

## 2017-12-19 DIAGNOSIS — N186 End stage renal disease: Secondary | ICD-10-CM | POA: Diagnosis not present

## 2017-12-19 DIAGNOSIS — Z992 Dependence on renal dialysis: Secondary | ICD-10-CM | POA: Diagnosis not present

## 2017-12-19 DIAGNOSIS — D631 Anemia in chronic kidney disease: Secondary | ICD-10-CM | POA: Diagnosis not present

## 2017-12-21 DIAGNOSIS — D509 Iron deficiency anemia, unspecified: Secondary | ICD-10-CM | POA: Diagnosis not present

## 2017-12-21 DIAGNOSIS — Z992 Dependence on renal dialysis: Secondary | ICD-10-CM | POA: Diagnosis not present

## 2017-12-21 DIAGNOSIS — N186 End stage renal disease: Secondary | ICD-10-CM | POA: Diagnosis not present

## 2017-12-21 DIAGNOSIS — D631 Anemia in chronic kidney disease: Secondary | ICD-10-CM | POA: Diagnosis not present

## 2017-12-21 DIAGNOSIS — N2581 Secondary hyperparathyroidism of renal origin: Secondary | ICD-10-CM | POA: Diagnosis not present

## 2017-12-23 DIAGNOSIS — N186 End stage renal disease: Secondary | ICD-10-CM | POA: Diagnosis not present

## 2017-12-23 DIAGNOSIS — D631 Anemia in chronic kidney disease: Secondary | ICD-10-CM | POA: Diagnosis not present

## 2017-12-23 DIAGNOSIS — D509 Iron deficiency anemia, unspecified: Secondary | ICD-10-CM | POA: Diagnosis not present

## 2017-12-23 DIAGNOSIS — Z992 Dependence on renal dialysis: Secondary | ICD-10-CM | POA: Diagnosis not present

## 2017-12-23 DIAGNOSIS — N2581 Secondary hyperparathyroidism of renal origin: Secondary | ICD-10-CM | POA: Diagnosis not present

## 2017-12-26 DIAGNOSIS — D509 Iron deficiency anemia, unspecified: Secondary | ICD-10-CM | POA: Diagnosis not present

## 2017-12-26 DIAGNOSIS — N186 End stage renal disease: Secondary | ICD-10-CM | POA: Diagnosis not present

## 2017-12-26 DIAGNOSIS — N2581 Secondary hyperparathyroidism of renal origin: Secondary | ICD-10-CM | POA: Diagnosis not present

## 2017-12-26 DIAGNOSIS — D631 Anemia in chronic kidney disease: Secondary | ICD-10-CM | POA: Diagnosis not present

## 2017-12-26 DIAGNOSIS — Z992 Dependence on renal dialysis: Secondary | ICD-10-CM | POA: Diagnosis not present

## 2017-12-27 ENCOUNTER — Ambulatory Visit: Payer: Medicare Other

## 2017-12-28 DIAGNOSIS — N186 End stage renal disease: Secondary | ICD-10-CM | POA: Diagnosis not present

## 2017-12-28 DIAGNOSIS — N2581 Secondary hyperparathyroidism of renal origin: Secondary | ICD-10-CM | POA: Diagnosis not present

## 2017-12-28 DIAGNOSIS — Z992 Dependence on renal dialysis: Secondary | ICD-10-CM | POA: Diagnosis not present

## 2017-12-28 DIAGNOSIS — D509 Iron deficiency anemia, unspecified: Secondary | ICD-10-CM | POA: Diagnosis not present

## 2017-12-28 DIAGNOSIS — D631 Anemia in chronic kidney disease: Secondary | ICD-10-CM | POA: Diagnosis not present

## 2017-12-30 DIAGNOSIS — Z992 Dependence on renal dialysis: Secondary | ICD-10-CM | POA: Diagnosis not present

## 2017-12-30 DIAGNOSIS — D509 Iron deficiency anemia, unspecified: Secondary | ICD-10-CM | POA: Diagnosis not present

## 2017-12-30 DIAGNOSIS — D631 Anemia in chronic kidney disease: Secondary | ICD-10-CM | POA: Diagnosis not present

## 2017-12-30 DIAGNOSIS — N2581 Secondary hyperparathyroidism of renal origin: Secondary | ICD-10-CM | POA: Diagnosis not present

## 2017-12-30 DIAGNOSIS — N186 End stage renal disease: Secondary | ICD-10-CM | POA: Diagnosis not present

## 2018-01-02 DIAGNOSIS — D509 Iron deficiency anemia, unspecified: Secondary | ICD-10-CM | POA: Diagnosis not present

## 2018-01-02 DIAGNOSIS — D631 Anemia in chronic kidney disease: Secondary | ICD-10-CM | POA: Diagnosis not present

## 2018-01-02 DIAGNOSIS — N2581 Secondary hyperparathyroidism of renal origin: Secondary | ICD-10-CM | POA: Diagnosis not present

## 2018-01-02 DIAGNOSIS — N186 End stage renal disease: Secondary | ICD-10-CM | POA: Diagnosis not present

## 2018-01-02 DIAGNOSIS — Z992 Dependence on renal dialysis: Secondary | ICD-10-CM | POA: Diagnosis not present

## 2018-01-04 DIAGNOSIS — N2581 Secondary hyperparathyroidism of renal origin: Secondary | ICD-10-CM | POA: Diagnosis not present

## 2018-01-04 DIAGNOSIS — Z992 Dependence on renal dialysis: Secondary | ICD-10-CM | POA: Diagnosis not present

## 2018-01-04 DIAGNOSIS — N186 End stage renal disease: Secondary | ICD-10-CM | POA: Diagnosis not present

## 2018-01-04 DIAGNOSIS — D631 Anemia in chronic kidney disease: Secondary | ICD-10-CM | POA: Diagnosis not present

## 2018-01-04 DIAGNOSIS — D509 Iron deficiency anemia, unspecified: Secondary | ICD-10-CM | POA: Diagnosis not present

## 2018-01-06 DIAGNOSIS — Z992 Dependence on renal dialysis: Secondary | ICD-10-CM | POA: Diagnosis not present

## 2018-01-06 DIAGNOSIS — D631 Anemia in chronic kidney disease: Secondary | ICD-10-CM | POA: Diagnosis not present

## 2018-01-06 DIAGNOSIS — N186 End stage renal disease: Secondary | ICD-10-CM | POA: Diagnosis not present

## 2018-01-06 DIAGNOSIS — D509 Iron deficiency anemia, unspecified: Secondary | ICD-10-CM | POA: Diagnosis not present

## 2018-01-06 DIAGNOSIS — N2581 Secondary hyperparathyroidism of renal origin: Secondary | ICD-10-CM | POA: Diagnosis not present

## 2018-01-09 DIAGNOSIS — N186 End stage renal disease: Secondary | ICD-10-CM | POA: Diagnosis not present

## 2018-01-09 DIAGNOSIS — N2581 Secondary hyperparathyroidism of renal origin: Secondary | ICD-10-CM | POA: Diagnosis not present

## 2018-01-09 DIAGNOSIS — D509 Iron deficiency anemia, unspecified: Secondary | ICD-10-CM | POA: Diagnosis not present

## 2018-01-09 DIAGNOSIS — Z992 Dependence on renal dialysis: Secondary | ICD-10-CM | POA: Diagnosis not present

## 2018-01-09 DIAGNOSIS — D631 Anemia in chronic kidney disease: Secondary | ICD-10-CM | POA: Diagnosis not present

## 2018-01-10 DIAGNOSIS — N186 End stage renal disease: Secondary | ICD-10-CM | POA: Diagnosis not present

## 2018-01-10 DIAGNOSIS — Z992 Dependence on renal dialysis: Secondary | ICD-10-CM | POA: Diagnosis not present

## 2018-01-11 DIAGNOSIS — D509 Iron deficiency anemia, unspecified: Secondary | ICD-10-CM | POA: Diagnosis not present

## 2018-01-11 DIAGNOSIS — N186 End stage renal disease: Secondary | ICD-10-CM | POA: Diagnosis not present

## 2018-01-11 DIAGNOSIS — Z992 Dependence on renal dialysis: Secondary | ICD-10-CM | POA: Diagnosis not present

## 2018-01-11 DIAGNOSIS — N2581 Secondary hyperparathyroidism of renal origin: Secondary | ICD-10-CM | POA: Diagnosis not present

## 2018-01-11 DIAGNOSIS — D631 Anemia in chronic kidney disease: Secondary | ICD-10-CM | POA: Diagnosis not present

## 2018-01-13 DIAGNOSIS — D509 Iron deficiency anemia, unspecified: Secondary | ICD-10-CM | POA: Diagnosis not present

## 2018-01-13 DIAGNOSIS — N186 End stage renal disease: Secondary | ICD-10-CM | POA: Diagnosis not present

## 2018-01-13 DIAGNOSIS — N2581 Secondary hyperparathyroidism of renal origin: Secondary | ICD-10-CM | POA: Diagnosis not present

## 2018-01-13 DIAGNOSIS — Z992 Dependence on renal dialysis: Secondary | ICD-10-CM | POA: Diagnosis not present

## 2018-01-13 DIAGNOSIS — D631 Anemia in chronic kidney disease: Secondary | ICD-10-CM | POA: Diagnosis not present

## 2018-01-16 DIAGNOSIS — N186 End stage renal disease: Secondary | ICD-10-CM | POA: Diagnosis not present

## 2018-01-16 DIAGNOSIS — N2581 Secondary hyperparathyroidism of renal origin: Secondary | ICD-10-CM | POA: Diagnosis not present

## 2018-01-16 DIAGNOSIS — D509 Iron deficiency anemia, unspecified: Secondary | ICD-10-CM | POA: Diagnosis not present

## 2018-01-16 DIAGNOSIS — Z992 Dependence on renal dialysis: Secondary | ICD-10-CM | POA: Diagnosis not present

## 2018-01-16 DIAGNOSIS — D631 Anemia in chronic kidney disease: Secondary | ICD-10-CM | POA: Diagnosis not present

## 2018-01-18 DIAGNOSIS — N2581 Secondary hyperparathyroidism of renal origin: Secondary | ICD-10-CM | POA: Diagnosis not present

## 2018-01-18 DIAGNOSIS — Z992 Dependence on renal dialysis: Secondary | ICD-10-CM | POA: Diagnosis not present

## 2018-01-18 DIAGNOSIS — D509 Iron deficiency anemia, unspecified: Secondary | ICD-10-CM | POA: Diagnosis not present

## 2018-01-18 DIAGNOSIS — N186 End stage renal disease: Secondary | ICD-10-CM | POA: Diagnosis not present

## 2018-01-18 DIAGNOSIS — D631 Anemia in chronic kidney disease: Secondary | ICD-10-CM | POA: Diagnosis not present

## 2018-01-20 DIAGNOSIS — N2581 Secondary hyperparathyroidism of renal origin: Secondary | ICD-10-CM | POA: Diagnosis not present

## 2018-01-20 DIAGNOSIS — Z992 Dependence on renal dialysis: Secondary | ICD-10-CM | POA: Diagnosis not present

## 2018-01-20 DIAGNOSIS — N186 End stage renal disease: Secondary | ICD-10-CM | POA: Diagnosis not present

## 2018-01-20 DIAGNOSIS — D631 Anemia in chronic kidney disease: Secondary | ICD-10-CM | POA: Diagnosis not present

## 2018-01-20 DIAGNOSIS — D509 Iron deficiency anemia, unspecified: Secondary | ICD-10-CM | POA: Diagnosis not present

## 2018-01-23 DIAGNOSIS — Z992 Dependence on renal dialysis: Secondary | ICD-10-CM | POA: Diagnosis not present

## 2018-01-23 DIAGNOSIS — N2581 Secondary hyperparathyroidism of renal origin: Secondary | ICD-10-CM | POA: Diagnosis not present

## 2018-01-23 DIAGNOSIS — N186 End stage renal disease: Secondary | ICD-10-CM | POA: Diagnosis not present

## 2018-01-23 DIAGNOSIS — D631 Anemia in chronic kidney disease: Secondary | ICD-10-CM | POA: Diagnosis not present

## 2018-01-23 DIAGNOSIS — D509 Iron deficiency anemia, unspecified: Secondary | ICD-10-CM | POA: Diagnosis not present

## 2018-01-25 DIAGNOSIS — N2581 Secondary hyperparathyroidism of renal origin: Secondary | ICD-10-CM | POA: Diagnosis not present

## 2018-01-25 DIAGNOSIS — D631 Anemia in chronic kidney disease: Secondary | ICD-10-CM | POA: Diagnosis not present

## 2018-01-25 DIAGNOSIS — D509 Iron deficiency anemia, unspecified: Secondary | ICD-10-CM | POA: Diagnosis not present

## 2018-01-25 DIAGNOSIS — Z992 Dependence on renal dialysis: Secondary | ICD-10-CM | POA: Diagnosis not present

## 2018-01-25 DIAGNOSIS — N186 End stage renal disease: Secondary | ICD-10-CM | POA: Diagnosis not present

## 2018-01-27 DIAGNOSIS — D631 Anemia in chronic kidney disease: Secondary | ICD-10-CM | POA: Diagnosis not present

## 2018-01-27 DIAGNOSIS — N186 End stage renal disease: Secondary | ICD-10-CM | POA: Diagnosis not present

## 2018-01-27 DIAGNOSIS — Z992 Dependence on renal dialysis: Secondary | ICD-10-CM | POA: Diagnosis not present

## 2018-01-27 DIAGNOSIS — D509 Iron deficiency anemia, unspecified: Secondary | ICD-10-CM | POA: Diagnosis not present

## 2018-01-27 DIAGNOSIS — N2581 Secondary hyperparathyroidism of renal origin: Secondary | ICD-10-CM | POA: Diagnosis not present

## 2018-01-30 DIAGNOSIS — N186 End stage renal disease: Secondary | ICD-10-CM | POA: Diagnosis not present

## 2018-01-30 DIAGNOSIS — N2581 Secondary hyperparathyroidism of renal origin: Secondary | ICD-10-CM | POA: Diagnosis not present

## 2018-01-30 DIAGNOSIS — D509 Iron deficiency anemia, unspecified: Secondary | ICD-10-CM | POA: Diagnosis not present

## 2018-01-30 DIAGNOSIS — Z992 Dependence on renal dialysis: Secondary | ICD-10-CM | POA: Diagnosis not present

## 2018-01-30 DIAGNOSIS — D631 Anemia in chronic kidney disease: Secondary | ICD-10-CM | POA: Diagnosis not present

## 2018-02-01 DIAGNOSIS — Z992 Dependence on renal dialysis: Secondary | ICD-10-CM | POA: Diagnosis not present

## 2018-02-01 DIAGNOSIS — N2581 Secondary hyperparathyroidism of renal origin: Secondary | ICD-10-CM | POA: Diagnosis not present

## 2018-02-01 DIAGNOSIS — D631 Anemia in chronic kidney disease: Secondary | ICD-10-CM | POA: Diagnosis not present

## 2018-02-01 DIAGNOSIS — D509 Iron deficiency anemia, unspecified: Secondary | ICD-10-CM | POA: Diagnosis not present

## 2018-02-01 DIAGNOSIS — N186 End stage renal disease: Secondary | ICD-10-CM | POA: Diagnosis not present

## 2018-02-03 DIAGNOSIS — Z992 Dependence on renal dialysis: Secondary | ICD-10-CM | POA: Diagnosis not present

## 2018-02-03 DIAGNOSIS — N2581 Secondary hyperparathyroidism of renal origin: Secondary | ICD-10-CM | POA: Diagnosis not present

## 2018-02-03 DIAGNOSIS — D631 Anemia in chronic kidney disease: Secondary | ICD-10-CM | POA: Diagnosis not present

## 2018-02-03 DIAGNOSIS — D509 Iron deficiency anemia, unspecified: Secondary | ICD-10-CM | POA: Diagnosis not present

## 2018-02-03 DIAGNOSIS — N186 End stage renal disease: Secondary | ICD-10-CM | POA: Diagnosis not present

## 2018-02-06 DIAGNOSIS — D509 Iron deficiency anemia, unspecified: Secondary | ICD-10-CM | POA: Diagnosis not present

## 2018-02-06 DIAGNOSIS — Z992 Dependence on renal dialysis: Secondary | ICD-10-CM | POA: Diagnosis not present

## 2018-02-06 DIAGNOSIS — N186 End stage renal disease: Secondary | ICD-10-CM | POA: Diagnosis not present

## 2018-02-06 DIAGNOSIS — D631 Anemia in chronic kidney disease: Secondary | ICD-10-CM | POA: Diagnosis not present

## 2018-02-06 DIAGNOSIS — N2581 Secondary hyperparathyroidism of renal origin: Secondary | ICD-10-CM | POA: Diagnosis not present

## 2018-02-08 DIAGNOSIS — Z992 Dependence on renal dialysis: Secondary | ICD-10-CM | POA: Diagnosis not present

## 2018-02-08 DIAGNOSIS — D631 Anemia in chronic kidney disease: Secondary | ICD-10-CM | POA: Diagnosis not present

## 2018-02-08 DIAGNOSIS — N2581 Secondary hyperparathyroidism of renal origin: Secondary | ICD-10-CM | POA: Diagnosis not present

## 2018-02-08 DIAGNOSIS — N186 End stage renal disease: Secondary | ICD-10-CM | POA: Diagnosis not present

## 2018-02-08 DIAGNOSIS — D509 Iron deficiency anemia, unspecified: Secondary | ICD-10-CM | POA: Diagnosis not present

## 2018-02-10 DIAGNOSIS — N186 End stage renal disease: Secondary | ICD-10-CM | POA: Diagnosis not present

## 2018-02-10 DIAGNOSIS — N2581 Secondary hyperparathyroidism of renal origin: Secondary | ICD-10-CM | POA: Diagnosis not present

## 2018-02-10 DIAGNOSIS — Z992 Dependence on renal dialysis: Secondary | ICD-10-CM | POA: Diagnosis not present

## 2018-02-10 DIAGNOSIS — D631 Anemia in chronic kidney disease: Secondary | ICD-10-CM | POA: Diagnosis not present

## 2018-02-10 DIAGNOSIS — D509 Iron deficiency anemia, unspecified: Secondary | ICD-10-CM | POA: Diagnosis not present

## 2018-02-13 DIAGNOSIS — D509 Iron deficiency anemia, unspecified: Secondary | ICD-10-CM | POA: Diagnosis not present

## 2018-02-13 DIAGNOSIS — D631 Anemia in chronic kidney disease: Secondary | ICD-10-CM | POA: Diagnosis not present

## 2018-02-13 DIAGNOSIS — N2581 Secondary hyperparathyroidism of renal origin: Secondary | ICD-10-CM | POA: Diagnosis not present

## 2018-02-13 DIAGNOSIS — N186 End stage renal disease: Secondary | ICD-10-CM | POA: Diagnosis not present

## 2018-02-13 DIAGNOSIS — Z992 Dependence on renal dialysis: Secondary | ICD-10-CM | POA: Diagnosis not present

## 2018-02-15 DIAGNOSIS — N186 End stage renal disease: Secondary | ICD-10-CM | POA: Diagnosis not present

## 2018-02-15 DIAGNOSIS — D631 Anemia in chronic kidney disease: Secondary | ICD-10-CM | POA: Diagnosis not present

## 2018-02-15 DIAGNOSIS — Z992 Dependence on renal dialysis: Secondary | ICD-10-CM | POA: Diagnosis not present

## 2018-02-15 DIAGNOSIS — D509 Iron deficiency anemia, unspecified: Secondary | ICD-10-CM | POA: Diagnosis not present

## 2018-02-15 DIAGNOSIS — N2581 Secondary hyperparathyroidism of renal origin: Secondary | ICD-10-CM | POA: Diagnosis not present

## 2018-02-16 ENCOUNTER — Ambulatory Visit: Payer: Medicare Other | Admitting: Nurse Practitioner

## 2018-02-17 ENCOUNTER — Ambulatory Visit: Payer: Medicare Other | Admitting: Nurse Practitioner

## 2018-02-17 DIAGNOSIS — N186 End stage renal disease: Secondary | ICD-10-CM | POA: Diagnosis not present

## 2018-02-17 DIAGNOSIS — Z992 Dependence on renal dialysis: Secondary | ICD-10-CM | POA: Diagnosis not present

## 2018-02-17 DIAGNOSIS — N2581 Secondary hyperparathyroidism of renal origin: Secondary | ICD-10-CM | POA: Diagnosis not present

## 2018-02-17 DIAGNOSIS — D509 Iron deficiency anemia, unspecified: Secondary | ICD-10-CM | POA: Diagnosis not present

## 2018-02-17 DIAGNOSIS — D631 Anemia in chronic kidney disease: Secondary | ICD-10-CM | POA: Diagnosis not present

## 2018-02-20 DIAGNOSIS — D509 Iron deficiency anemia, unspecified: Secondary | ICD-10-CM | POA: Diagnosis not present

## 2018-02-20 DIAGNOSIS — N186 End stage renal disease: Secondary | ICD-10-CM | POA: Diagnosis not present

## 2018-02-20 DIAGNOSIS — D631 Anemia in chronic kidney disease: Secondary | ICD-10-CM | POA: Diagnosis not present

## 2018-02-20 DIAGNOSIS — N2581 Secondary hyperparathyroidism of renal origin: Secondary | ICD-10-CM | POA: Diagnosis not present

## 2018-02-20 DIAGNOSIS — Z992 Dependence on renal dialysis: Secondary | ICD-10-CM | POA: Diagnosis not present

## 2018-02-21 ENCOUNTER — Encounter: Payer: Self-pay | Admitting: Nurse Practitioner

## 2018-02-21 ENCOUNTER — Ambulatory Visit (INDEPENDENT_AMBULATORY_CARE_PROVIDER_SITE_OTHER): Payer: Medicare Other | Admitting: Nurse Practitioner

## 2018-02-21 ENCOUNTER — Other Ambulatory Visit: Payer: Self-pay

## 2018-02-21 ENCOUNTER — Ambulatory Visit (INDEPENDENT_AMBULATORY_CARE_PROVIDER_SITE_OTHER): Payer: Medicare Other

## 2018-02-21 VITALS — BP 168/76 | HR 78 | Temp 98.2°F | Resp 17 | Ht 66.0 in | Wt 164.4 lb

## 2018-02-21 DIAGNOSIS — I150 Renovascular hypertension: Secondary | ICD-10-CM

## 2018-02-21 DIAGNOSIS — N186 End stage renal disease: Secondary | ICD-10-CM | POA: Diagnosis not present

## 2018-02-21 DIAGNOSIS — Z0000 Encounter for general adult medical examination without abnormal findings: Secondary | ICD-10-CM

## 2018-02-21 DIAGNOSIS — Z992 Dependence on renal dialysis: Secondary | ICD-10-CM | POA: Diagnosis not present

## 2018-02-21 DIAGNOSIS — Z1159 Encounter for screening for other viral diseases: Secondary | ICD-10-CM

## 2018-02-21 DIAGNOSIS — I10 Essential (primary) hypertension: Secondary | ICD-10-CM

## 2018-02-21 DIAGNOSIS — J449 Chronic obstructive pulmonary disease, unspecified: Secondary | ICD-10-CM | POA: Diagnosis not present

## 2018-02-21 DIAGNOSIS — Z1211 Encounter for screening for malignant neoplasm of colon: Secondary | ICD-10-CM | POA: Diagnosis not present

## 2018-02-21 DIAGNOSIS — Z114 Encounter for screening for human immunodeficiency virus [HIV]: Secondary | ICD-10-CM

## 2018-02-21 MED ORDER — BUDESONIDE 180 MCG/ACT IN AEPB: 1.0000 | INHALATION_SPRAY | Freq: Two times a day (BID) | RESPIRATORY_TRACT | 11 refills | Status: DC

## 2018-02-21 MED ORDER — METOPROLOL TARTRATE 100 MG PO TABS: 100.0000 mg | ORAL_TABLET | Freq: Two times a day (BID) | ORAL | 3 refills | Status: DC

## 2018-02-21 NOTE — Patient Instructions (Addendum)
Daryl Weeks,   Thank you for coming in to clinic today.  1. Really work hard on fluid status.  LIMIT intake to 48 oz per day or less daily.   - 2 cups coffee (try cutting back to 1 cup) - This will reduce your time in your dialysis chair and will keep your blood pressure lower.  2. INCREASE metoprolol to 100 mg twice daily.  Please schedule a follow-up appointment with Cassell Smiles, AGNP. Return in about 3 months (around 05/24/2018) for hypertension, COPD.  If you have any other questions or concerns, please feel free to call the clinic or send a message through [place]. You may also schedule an earlier appointment if necessary.  You will receive a survey after today's visit either digitally by e-mail or paper by [employer]. Your experiences and feedback matter to [place].  Please respond so we know how we are doing as we provide care for you.   Cassell Smiles, DNP, AGNP-BC Adult Gerontology Nurse Practitioner [place]

## 2018-02-21 NOTE — Progress Notes (Signed)
Subjective:   Daryl Weeks is a 57 y.o. male who presents for an Initial Medicare Annual Wellness Visit.  Review of Systems  Cardiac Risk Factors include: dyslipidemia;advanced age (>50men, >67 women);smoking/ tobacco exposure;male gender;hypertension;family history of premature cardiovascular disease    Objective:    Today's Vitals   02/21/18 0855  BP: (!) 168/76  Pulse: 78  Resp: 17  Temp: 98.2 F (36.8 C)  TempSrc: Oral  Weight: 164 lb 6.4 oz (74.6 kg)  Height: 5\' 6"  (1.676 m)   Body mass index is 26.53 kg/m.  Advanced Directives 02/21/2018 12/21/2016 10/28/2016 10/05/2016 06/12/2015 11/30/2013 11/27/2013  Does Patient Have a Medical Advance Directive? Yes Yes No Yes No;Yes Patient has advance directive, copy not in chart Patient has advance directive, copy not in chart  Type of Advance Directive Living will;Healthcare Power of [place] in Chart? No - copy requested - - - No - copy requested - Copy requested from family    Current Medications (verified) Outpatient Encounter Medications as of 02/21/2018  Medication Sig  . albuterol (VENTOLIN HFA) 108 (90 Base) MCG/ACT inhaler Inhale 2 puffs into the lungs every 6 (six) hours as needed for wheezing or shortness of breath.  [name] amLODipine (NORVASC) 10 MG tablet take 1 tablet by mouth once daily  . aspirin EC 81 MG tablet Take 1 tablet (81 mg total) by mouth daily.  [name] atorvastatin (LIPITOR) 20 MG tablet take 1 tablet by mouth at bedtime  . AURYXIA 1 GM 210 MG(Fe) tablet TK 1 T PO  TID BEFORE EACH MEAL  . budesonide (PULMICORT) 0.5 MG/2ML nebulizer solution Take 2 mLs (0.5 mg total) by nebulization every 12 (twelve) hours.  . calcium carbonate (TUMS - DOSED IN MG ELEMENTAL CALCIUM) 500 MG chewable tablet Chew 1 tablet by mouth daily.  . cloNIDine (CATAPRES) 0.1 MG tablet TK 1 T  PO BID FOR HIGH BP  . furosemide (LASIX) 80 MG tablet Take 80 mg by mouth daily.  . irbesartan (AVAPRO) 150 MG tablet TK 1 T PO D AT NIGHT FOR HIGH BP  . irbesartan (AVAPRO) 300 MG tablet TK 1 T PO ATN FOR HIGH BP  . lidocaine-prilocaine (EMLA) cream Apply 1 application topically as needed.  . metoprolol (LOPRESSOR) 50 MG tablet take 1 tablet by mouth twice a day  . sevelamer carbonate (RENVELA) 800 MG tablet Take 800 mg by mouth 3 (three) times daily with meals.  . tiotropium (SPIRIVA) 18 MCG inhalation capsule Place 1 capsule (18 mcg total) into inhaler and inhale daily.  [name] zolpidem (AMBIEN) 10 MG tablet Take 10 mg by mouth at bedtime as needed for sleep.  . cinacalcet (SENSIPAR) 30 MG tablet Take by mouth.  . gabapentin (NEURONTIN) 100 MG capsule TK 1 C PO TID  . gabapentin (NEURONTIN) 300 MG capsule Take 300 mg by mouth 2 (two) times daily.    Facility-Administered Encounter Medications as of 02/21/2018  Medication  . ceFAZolin (ANCEF) IVPB 1 g/50 mL premix    Allergies (verified) Patient has no known allergies.   History: Past Medical History:  Diagnosis Date  . Anemia   . Anxiety   . Arthritis   . CHF (congestive heart failure) ([place])   . Colon polyps   . COPD (chronic obstructive pulmonary disease) ([place])   . Coronary artery disease    Non-ST elevation myocardial infarction  in February of 2015. In the setting of hypertensive urgency and blood loss anemia. Cardiac catheterization showed significant three-vessel coronary artery disease. EF was 55% by echo. He underwent [facility]G at [facility] on May 19 with LIMA to LAD, Sequential SVG to OM1 and OM2, and SVG to RCA  . End stage renal disease ([place])    m-w-f   -[place]   . End stage renal disease ([place])   . ETOH abuse   . GERD (gastroesophageal reflux disease)    hx  . Hyperlipidemia   . Hypertension   . IgA nephropathy   . Lower GI bleed    Due to colon polyps. Status post resection of 14 polyps  . Non-ST elevation MI  (NSTEMI) ([place])   . Pneumonia    hx  . Rheumatoid arthritis ([place])   . Shortness of breath   . Sleep apnea   . Tobacco abuse   . Tobacco abuse    Past Surgical History:  Procedure Laterality Date  . A/V FISTULAGRAM Left 10/28/2016   Procedure: A/V Fistulagram;  Surgeon: [name], MD;  Location: [place] [facility];  Service: Cardiovascular;  Laterality: Left;  . A/V SHUNT INTERVENTION N/A 10/28/2016   Procedure: A/V Shunt Intervention;  Surgeon: [name], MD;  Location: [place] [facility];  Service: Cardiovascular;  Laterality: N/A;  . AV FISTULA PLACEMENT    . CARDIAC CATHETERIZATION     RCA 90% and calcified mid LAD 80% Stenosis  . CORONARY ARTERY BYPASS GRAFT N/A 11/29/2013   Procedure: CORONARY ARTERY BYPASS GRAFTING (CABG) x 4 using endoscopically harvested right saphenous vein and left internal mammary artery;  Surgeon: [name], MD;  Location: [place] OR;  Service: Open Heart Surgery;  Laterality: N/A;  . dialysis catheterr  2/15  . INTRAOPERATIVE TRANSESOPHAGEAL ECHOCARDIOGRAM N/A 11/29/2013   Procedure: INTRAOPERATIVE TRANSESOPHAGEAL ECHOCARDIOGRAM;  Surgeon: [name], MD;  [facility];  Service: Open Heart Surgery;  Laterality: N/A;  . PERIPHERAL VASCULAR CATHETERIZATION N/A 06/12/2015   Procedure: A/V Shuntogram/Fistulagram;  Surgeon: [name], MD;  Location: [place] [facility];  Service: Cardiovascular;  Laterality: N/A;  . PERIPHERAL VASCULAR CATHETERIZATION Left 06/12/2015   Procedure: A/V Shunt Intervention;  Surgeon: [name], MD;  Location: [place] [facility];  Service: Cardiovascular;  Laterality: Left;  . RENAL BIOPSY Left 14   Family History  Problem Relation Age of Onset  . Heart disease Father   . Heart disease Brother   . Healthy Sister   . Stroke Neg Hx    Social History   Socioeconomic History  . Marital status: Single    Spouse name: Not on file  . Number of children: Not on file  . Years of education: Not on file  . Highest  education level: 8th grade  Occupational History  . Not on file  Social Needs  . Financial resource strain: Not hard at all  . Food insecurity:    Worry: Never true    Inability: Never true  . Transportation needs:    Medical: No    Non-medical: No  Tobacco Use  . Smoking status: Current Every Day Smoker    Packs/day: 0.50    Years: 32.00    Pack years: 16.00    Types: Cigarettes  . Smokeless tobacco: Never Used  . Tobacco comment: daily  Substance and Sexual Activity  . Alcohol use: Yes    Alcohol/week: 3.6 oz    Types: 6 Cans of beer per  week    Comment: weekly  . Drug use: No  . Sexual activity: Not on file  Lifestyle  . Physical activity:    Days per week: 0 days    Minutes per session: 0 min  . Stress: Not at all  Relationships  . Social connections:    Talks on phone: More than three times a week    Gets together: More than three times a week    Attends religious service: Never    Active member of club or organization: No    Attends meetings of clubs or organizations: Never    Relationship status: Divorced  Other Topics Concern  . Not on file  Social History Narrative  . Not on file   Tobacco Counseling Ready to quit: Yes Counseling given: Yes Comment: daily   Clinical Intake:  Pre-visit preparation completed: Yes  Pain : No/denies pain     Nutritional Status: BMI 25 -29 Overweight Nutritional Risks: None Diabetes: No  How often do you need to have someone help you when you read instructions, pamphlets, or other written materials from your doctor or pharmacy?: 4 - Often What is the last grade level you completed in school?: 8th grade  Interpreter Needed?: No  Information entered by :: Payten Hobin Mcisaac,LPN  Activities of Daily Living In your present state of health, do you have any difficulty performing the following activities: 02/21/2018  Hearing? N  Vision? N  Difficulty concentrating or making decisions? Y  Walking or climbing stairs? Y    Comment SOB with multiple stairs  Dressing or bathing? N  Doing errands, shopping? N  Preparing Food and eating ? N  Using the Toilet? N  In the past six months, have you accidently leaked urine? N  Do you have problems with loss of bowel control? N  Managing your Medications? N  Managing your Finances? N  Housekeeping or managing your Housekeeping? N  Some recent data might be hidden     Immunizations and Health Maintenance Immunization History  Administered Date(s) Administered  . Influenza-Unspecified 06/20/2015   Health Maintenance Due  Topic Date Due  . Hepatitis C Screening  05-19-1961  . HIV Screening  04/25/1978  . TETANUS/TDAP  04/25/1982  . COLONOSCOPY  03/13/2017    Patient Care Team: Mikey College, NP as PCP - General (Adult Health Nurse Practitioner) Wellington Hampshire, MD as Consulting Physician (Cardiology) Murlean Iba, MD as Consulting Physician (Nephrology)  Indicate any recent Medical Services you may have received from other than [facility] providers in the past year (date may be approximate).    Assessment:   This is a routine wellness examination for Nyaire.  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 Quit Smoking     Smoking cessation discussed      Depression Screen PHQ 2/9 Scores 02/21/2018 10/04/2017  PHQ - 2 Score 3 4  PHQ- 9 Score 8 13    Fall Risk Fall Risk  02/21/2018  Falls in the past year? Yes  Number falls in past yr: 2 or more  Injury with Fall? No  Risk Factor Category  High Fall Risk  Follow up Falls prevention discussed    Is the patient's home free of loose throw rugs in walkways, pet beds,  electrical cords, etc?   no      Grab bars in the  bathroom? yes      Handrails on the stairs?   no      Adequate lighting?   yes  Timed Get Up and Go performed: Completed in 8 seconds with no use of assistive devices, steady gait. No intervention needed at this time.   Cognitive Function:     6CIT Screen 02/21/2018  What Year? 0 points  What month? 0 points  What time? 0 points  Count back from 20 0 points  Months in reverse 0 points  Repeat phrase 0 points  Total Score 0    Screening Tests Health Maintenance  Topic Date Due  . Hepatitis C Screening  Mar 25, 1961  . HIV Screening  04/25/1978  . TETANUS/TDAP  04/25/1982  . COLONOSCOPY  03/13/2017  . INFLUENZA VACCINE  04/13/2018    Qualifies for Shingles Vaccine? Yes. Discussed shingrix vaccine  Cancer Screenings: Lung: Low Dose CT Chest recommended if Age 13-80 years, 30 pack-year currently smoking OR have quit w/in 15years. Patient does not qualify. Colorectal: due now - referral placed  Additional Screenings:  Hepatitis C Screening: will order for future lab draw      Plan:    I have personally reviewed and addressed the Medicare Annual Wellness questionnaire and have noted the following in the patient's chart:  A. Medical and social history B. Use of alcohol, tobacco or illicit drugs  C. Current medications and supplements D. Functional ability and status E.  Nutritional status F.  Physical activity G. Advance directives H. List of other physicians I.  Hospitalizations, surgeries, and ER visits in previous 12 months J.  [place] such as hearing and vision if needed, cognitive and depression L. Referrals and appointments   In addition, I have reviewed and discussed with patient certain preventive protocols, quality metrics, and best practice recommendations. A written personalized care plan for preventive services as well as general preventive health recommendations were provided to patient.   Signed,  Tyler Aas, LPN Nurse Health  Advisor   Nurse Notes:none

## 2018-02-21 NOTE — Progress Notes (Signed)
Subjective:    Patient ID: Daryl Weeks, male    DOB: 03/25/1961, 57 y.o.   MRN: [id number]  Daryl Weeks is a 57 y.o. male presenting on 02/21/2018 for COPD (hyperlipidemia)   HPI COPD: He presents for evaluation and treatment of COPD. Symptoms include dyspnea on exertion, productive cough and worsening symptoms with hot/humid weather. Symptoms began several years ago, gradually worsening since that time.  He denies constant cough and dyspnea at rest.   Weight has been stable.  Appetite has been unchanged. Symptoms are exacerbated by moderate activity.  Symptoms are alleviated by rest and medication(s) (albuterol and budesonide).  Uses budesonide neb 2-3 times per week. More with increased humidity.No current exacerbation The patient has been deviating from this regimen as follows: not always taking Pulmicort second dose daily.  Is getting Spiriva daily. He does not have had any adverse reactions or side effects to medications.  Hyperlipidemia: Patient presents for followup of hyperlipidemia.  His last labs showed Total cholesterol of 108, HDL 71, LDL 28,  Triglycerides 31. Pt has a personal history of ASCVD with CAD s/p CABG x 4. There is a family history of hyperlipidemia. There is not a family history of early ischemia heart disease. He is taking atorvastatin 20 mg daily and is tolerating well without side effects.  Pt denies changes in vision, chest tightness/pressure, palpitations, shortness of breath, leg pain while walking, leg or arm weakness, and sudden loss of speech or loss of consciousness.   Hypertension/ CKD - He is not checking BP at home or outside of clinic.  Readings labile with HD treatments - Current medications: irbesartan, metoprolol, amlodipine, tolerating well without side effects - He is not currently symptomatic. - Pt denies headache, lightheadedness, dizziness, changes in vision, chest tightness/pressure, palpitations, leg swelling, sudden loss of speech or loss of  consciousness. - He  reports no regular exercise routine. - His diet is moderate in salt, moderate in fat, and moderate in carbohydrates.  - Does not usually adhere to lower fluid intake recommendations for HD and CKD.  Pt drinks upward of 2L daily.  Was increased from 4 hrs to 4.5 hr treatments with HD.  Has been asked to consider 4-day per week treatment regimen.   - Pt has had vascular access complications - has visit with Dr. Lucky Cowboy to consider revision tomorrow.  Social History   Tobacco Use  . Smoking status: Current Every Day Smoker    Packs/day: 0.50    Years: 32.00    Pack years: 16.00    Types: Cigarettes  . Smokeless tobacco: Never Used  . Tobacco comment: daily  Substance Use Topics  . Alcohol use: Yes    Alcohol/week: 3.6 oz    Types: 6 Cans of beer per week    Comment: weekly  . Drug use: No    Review of Systems Per HPI unless specifically indicated above     Objective:    BP (!) 168/76 (BP Location: Right Arm, Patient Position: Sitting, Cuff Size: Normal)   Pulse 78   Temp 98.2 F (36.8 C) (Oral)   Resp 17   Ht 5\' 6"  (1.676 m)   Wt 164 lb 6.4 oz (74.6 kg)   BMI 26.53 kg/m   Wt Readings from Last 3 Encounters:  02/21/18 164 lb 6.4 oz (74.6 kg)  02/21/18 164 lb 6.4 oz (74.6 kg)  11/15/17 159 lb 3.2 oz (72.2 kg)    Physical Exam  Constitutional: He is oriented to person,  place, and time. He appears well-developed and well-nourished. No distress.  HENT:  Head: Normocephalic and atraumatic.  Neck: Normal range of motion. Neck supple. Carotid bruit is not present.  Cardiovascular: Normal rate, regular rhythm, S1 normal, S2 normal, normal heart sounds and intact distal pulses.  Left anterior FA HD fistula with positive bruit and thrill.  Has pseudoaneurysm along length of forearm - worst at distal forearm near HD access.  Pulmonary/Chest: Effort normal. No respiratory distress. He has decreased breath sounds (throughout all lobes). He has no wheezes. He has no  rhonchi. He has no rales.  Abdominal: Soft. Bowel sounds are normal.  Musculoskeletal: He exhibits no edema (pedal).  Neurological: He is alert and oriented to person, place, and time.  Skin: Skin is warm and dry. Capillary refill takes less than 2 seconds.  Psychiatric: He has a normal mood and affect. His behavior is normal. Judgment and thought content normal.  Vitals reviewed.  Results for orders placed or performed in visit on 11/15/17  Lipid panel  Result Value Ref Range   Cholesterol 108 <200 mg/dL   HDL 71 >40 mg/dL   Triglycerides 31 <150 mg/dL   LDL Cholesterol (Calc) 28 mg/dL (calc)   Total CHOL/HDL Ratio 1.5 <5.0 (calc)   Non-HDL Cholesterol (Calc) 37 <130 mg/dL (calc)  Hemoglobin A1c  Result Value Ref Range   Hgb A1c MFr Bld 5.1 <5.7 % of total Hgb   Mean Plasma Glucose 100 (calc)   eAG (mmol/L) 5.5 (calc)      Assessment & Plan:   Problem List Items Addressed This Visit      Cardiovascular and Mediastinum   Essential hypertension   Relevant Medications   metoprolol tartrate (LOPRESSOR) 100 MG tablet     Respiratory   COPD (chronic obstructive pulmonary disease) (HCC) - Primary   Relevant Medications   budesonide (PULMICORT FLEXHALER) 180 MCG/ACT inhaler     Genitourinary   ESRD on dialysis ([place])    Hypertension and ESRD:  Labile per pt report and not appropriately managed with medications currently. Complicated by lack of adherence to fluid restriction with ESRD on dialysis. Medication adherence for morning doses is not always present if rushed to get to HD treatment. - Motivational interviewing performed today regarding fluid intake.  Discussed pt and nephrologists may be able to consider shorter HD treatment if not drinking as much fluid.  Regularly needs 2-3 L instead of 1.5 -2 L which would be goal per HD treatment per pt. Pt motivated by not wanting to add 4th treatment day. - Continue irbesartan and amlodipine without change - INCREASE metoprolol to  100 mg bid.  If not tolerated, can lower dose.  Instructed pt to call. - Followup 3 months  COPD: Stable symptoms.  Modest improvement in adventitious breath sounds after addition of maintenance inhalers.  Reduced use of rescue medications.  Continue to encourage smoking cessation. Pt not currently ready to quit.  Continue medications without changes.  Encouraged pt to use Pulmicort twice daily.  Followup 3 months.  Meds ordered this encounter  Medications  . budesonide (PULMICORT FLEXHALER) 180 MCG/ACT inhaler    Sig: Inhale 1 puff into the lungs 2 (two) times daily.    Dispense:  1 each    Refill:  11    Order Specific Question:   Supervising Provider    Answer:   Olin Hauser [[id number]]  . metoprolol tartrate (LOPRESSOR) 100 MG tablet    Sig: Take 1 tablet (100 mg total) by  mouth 2 (two) times daily.    Dispense:  180 tablet    Refill:  3    Order Specific Question:   Supervising Provider    Answer:   Olin Hauser [[id number]]    Follow up plan: Return in about 3 months (around 05/24/2018) for hypertension, COPD.  Cassell Smiles, DNP, AGPCNP-BC Adult Gerontology Primary Care Nurse Practitioner [facility] 02/21/2018, 9:44 AM

## 2018-02-21 NOTE — Patient Instructions (Addendum)
Daryl Weeks , Thank you for taking time to come for your Medicare Wellness Visit. I appreciate your ongoing commitment to your health goals. Please review the following plan we discussed and let me know if I can assist you in the future.   Screening recommendations/referrals: Colonoscopy: due now, referral placed- someone will call you to schedule this.  Recommended yearly ophthalmology/optometry visit for glaucoma screening and checkup Recommended yearly dental visit for hygiene and checkup  Vaccinations: Influenza vaccine: due 05/2018 Pneumococcal vaccine: due at age 54 Tdap vaccine: due now, check with your insurance company for coverage information Shingles vaccine: shingrix eligible, check with your insurance company for coverage    Advanced directives: Please bring a copy of your health care power of attorney and living will to the office at your convenience.  Conditions/risks identified: Smoking cessation discussed  Next appointment: Follow up in one year for your annual wellness exam.   Preventive Care 40-64 Years, Male Preventive care refers to lifestyle choices and visits with your health care provider that can promote health and wellness. What does preventive care include?  A yearly physical exam. This is also called an annual well check.  Dental exams once or twice a year.  Routine eye exams. Ask your health care provider how often you should have your eyes checked.  Personal lifestyle choices, including:  Daily care of your teeth and gums.  Regular physical activity.  Eating a healthy diet.  Avoiding tobacco and drug use.  Limiting alcohol use.  Practicing safe sex.  Taking low-dose aspirin every day starting at age 34. What happens during an annual well check? The services and screenings done by your health care provider during your annual well check will depend on your age, overall health, lifestyle risk factors, and family history of disease. Counseling    Your health care provider may ask you questions about your:  Alcohol use.  Tobacco use.  Drug use.  Emotional well-being.  Home and relationship well-being.  Sexual activity.  Eating habits.  Work and work Statistician. Screening  You may have the following tests or measurements:  Height, weight, and BMI.  Blood pressure.  Lipid and cholesterol levels. These may be checked every 5 years, or more frequently if you are over 41 years old.  Skin check.  Lung cancer screening. You may have this screening every year starting at age 34 if you have a 30-pack-year history of smoking and currently smoke or have quit within the past 15 years.  Fecal occult blood test (FOBT) of the stool. You may have this test every year starting at age 41.  Flexible sigmoidoscopy or colonoscopy. You may have a sigmoidoscopy every 5 years or a colonoscopy every 10 years starting at age 57.  Prostate cancer screening. Recommendations will vary depending on your family history and other risks.  Hepatitis C blood test.  Hepatitis B blood test.  Sexually transmitted disease (STD) testing.  Diabetes screening. This is done by checking your blood sugar (glucose) after you have not eaten for a while (fasting). You may have this done every 1-3 years. Discuss your test results, treatment options, and if necessary, the need for more tests with your health care provider. Vaccines  Your health care provider may recommend certain vaccines, such as:  Influenza vaccine. This is recommended every year.  Tetanus, diphtheria, and acellular pertussis (Tdap, Td) vaccine. You may need a Td booster every 10 years.  Zoster vaccine. You may need this after age 16.  Pneumococcal 13-valent  conjugate (PCV13) vaccine. You may need this if you have certain conditions and have not been vaccinated.  Pneumococcal polysaccharide (PPSV23) vaccine. You may need one or two doses if you smoke cigarettes or if you have  certain conditions. Talk to your health care provider about which screenings and vaccines you need and how often you need them. This information is not intended to replace advice given to you by your health care provider. Make sure you discuss any questions you have with your health care provider. Document Released: 09/26/2015 Document Revised: 05/19/2016 Document Reviewed: 07/01/2015 Elsevier Interactive Patient Education  2017 [employer] Prevention in the Home Falls can cause injuries. They can happen to people of all ages. There are many things you can do to make your home safe and to help prevent falls. What can I do on the outside of my home?  Regularly fix the edges of walkways and driveways and fix any cracks.  Remove anything that might make you trip as you walk through a door, such as a raised step or threshold.  Trim any bushes or trees on the path to your home.  Use bright outdoor lighting.  Clear any walking paths of anything that might make someone trip, such as rocks or tools.  Regularly check to see if handrails are loose or broken. Make sure that both sides of any steps have handrails.  Any raised decks and porches should have guardrails on the edges.  Have any leaves, snow, or ice cleared regularly.  Use sand or salt on walking paths during winter.  Clean up any spills in your garage right away. This includes oil or grease spills. What can I do in the bathroom?  Use night lights.  Install grab bars by the toilet and in the tub and shower. Do not use towel bars as grab bars.  Use non-skid mats or decals in the tub or shower.  If you need to sit down in the shower, use a plastic, non-slip stool.  Keep the floor dry. Clean up any water that spills on the floor as soon as it happens.  Remove soap buildup in the tub or shower regularly.  Attach bath mats securely with double-sided non-slip rug tape.  Do not have throw rugs and other things on the  floor that can make you trip. What can I do in the bedroom?  Use night lights.  Make sure that you have a light by your bed that is easy to reach.  Do not use any sheets or blankets that are too big for your bed. They should not hang down onto the floor.  Have a firm chair that has side arms. You can use this for support while you get dressed.  Do not have throw rugs and other things on the floor that can make you trip. What can I do in the kitchen?  Clean up any spills right away.  Avoid walking on wet floors.  Keep items that you use a lot in easy-to-reach places.  If you need to reach something above you, use a strong step stool that has a grab bar.  Keep electrical cords out of the way.  Do not use floor polish or wax that makes floors slippery. If you must use wax, use non-skid floor wax.  Do not have throw rugs and other things on the floor that can make you trip. What can I do with my stairs?  Do not leave any items on the stairs.  Make sure that there are handrails on both sides of the stairs and use them. Fix handrails that are broken or loose. Make sure that handrails are as long as the stairways.  Check any carpeting to make sure that it is firmly attached to the stairs. Fix any carpet that is loose or worn.  Avoid having throw rugs at the top or bottom of the stairs. If you do have throw rugs, attach them to the floor with carpet tape.  Make sure that you have a light switch at the top of the stairs and the bottom of the stairs. If you do not have them, ask someone to add them for you. What else can I do to help prevent falls?  Wear shoes that:  Do not have high heels.  Have rubber bottoms.  Are comfortable and fit you well.  Are closed at the toe. Do not wear sandals.  If you use a stepladder:  Make sure that it is fully opened. Do not climb a closed stepladder.  Make sure that both sides of the stepladder are locked into place.  Ask someone to  hold it for you, if possible.  Clearly mark and make sure that you can see:  Any grab bars or handrails.  First and last steps.  Where the edge of each step is.  Use tools that help you move around (mobility aids) if they are needed. These include:  Canes.  Walkers.  Scooters.  Crutches.  Turn on the lights when you go into a dark area. Replace any light bulbs as soon as they burn out.  Set up your furniture so you have a clear path. Avoid moving your furniture around.  If any of your floors are uneven, fix them.  If there are any pets around you, be aware of where they are.  Review your medicines with your doctor. Some medicines can make you feel dizzy. This can increase your chance of falling. Ask your doctor what other things that you can do to help prevent falls. This information is not intended to replace advice given to you by your health care provider. Make sure you discuss any questions you have with your health care provider. Document Released: 06/26/2009 Document Revised: 02/05/2016 Document Reviewed: 10/04/2014 Elsevier Interactive Patient Education  2017 [employer].   Steps to Quit Smoking Smoking tobacco can be bad for your health. It can also affect almost every organ in your body. Smoking puts you and people around you at risk for many serious long-lasting (chronic) diseases. Quitting smoking is hard, but it is one of the best things that you can do for your health. It is never too late to quit. What are the benefits of quitting smoking? When you quit smoking, you lower your risk for getting serious diseases and conditions. They can include:  Lung cancer or lung disease.  Heart disease.  Stroke.  Heart attack.  Not being able to have children (infertility).  Weak bones (osteoporosis) and broken bones (fractures).  If you have coughing, wheezing, and shortness of breath, those symptoms may get better when you quit. You may also get sick less often.  If you are pregnant, quitting smoking can help to lower your chances of having a baby of low birth weight. What can I do to help me quit smoking? Talk with your doctor about what can help you quit smoking. Some things you can do (strategies) include:  Quitting smoking totally, instead of slowly cutting back how much you  smoke over a period of time.  Going to in-person counseling. You are more likely to quit if you go to many counseling sessions.  Using resources and support systems, such as: ? Database administrator with a Social worker. ? Phone quitlines. ? Careers information officer. ? Support groups or group counseling. ? Text messaging programs. ? Mobile phone apps or applications.  Taking medicines. Some of these medicines may have nicotine in them. If you are pregnant or breastfeeding, do not take any medicines to quit smoking unless your doctor says it is okay. Talk with your doctor about counseling or other things that can help you.  Talk with your doctor about using more than one strategy at the same time, such as taking medicines while you are also going to in-person counseling. This can help make quitting easier. What things can I do to make it easier to quit? Quitting smoking might feel very hard at first, but there is a lot that you can do to make it easier. Take these steps:  Talk to your family and friends. Ask them to support and encourage you.  Call phone quitlines, reach out to support groups, or work with a Social worker.  Ask people who smoke to not smoke around you.  Avoid places that make you want (trigger) to smoke, such as: ? Bars. ? Parties. ? Smoke-break areas at work.  Spend time with people who do not smoke.  Lower the stress in your life. Stress can make you want to smoke. Try these things to help your stress: ? Getting regular exercise. ? Deep-breathing exercises. ? Yoga. ? Meditating. ? Doing a body scan. To do this, close your eyes, focus on one area of your  body at a time from head to toe, and notice which parts of your body are tense. Try to relax the muscles in those areas.  Download or buy apps on your mobile phone or tablet that can help you stick to your quit plan. There are many free apps, such as QuitGuide from the [employer] Office manager for Disease Control and Prevention). You can find more support from smokefree.gov and other websites.  This information is not intended to replace advice given to you by your health care provider. Make sure you discuss any questions you have with your health care provider. Document Released: 06/26/2009 Document Revised: 04/27/2016 Document Reviewed: 01/14/2015 Elsevier Interactive Patient Education  2018 [employer].

## 2018-02-22 DIAGNOSIS — N186 End stage renal disease: Secondary | ICD-10-CM | POA: Diagnosis not present

## 2018-02-22 DIAGNOSIS — N2581 Secondary hyperparathyroidism of renal origin: Secondary | ICD-10-CM | POA: Diagnosis not present

## 2018-02-22 DIAGNOSIS — D631 Anemia in chronic kidney disease: Secondary | ICD-10-CM | POA: Diagnosis not present

## 2018-02-22 DIAGNOSIS — D509 Iron deficiency anemia, unspecified: Secondary | ICD-10-CM | POA: Diagnosis not present

## 2018-02-22 DIAGNOSIS — Z992 Dependence on renal dialysis: Secondary | ICD-10-CM | POA: Diagnosis not present

## 2018-02-23 ENCOUNTER — Ambulatory Visit (INDEPENDENT_AMBULATORY_CARE_PROVIDER_SITE_OTHER): Payer: Medicare Other | Admitting: Vascular Surgery

## 2018-02-23 ENCOUNTER — Encounter (INDEPENDENT_AMBULATORY_CARE_PROVIDER_SITE_OTHER): Payer: Self-pay

## 2018-02-23 ENCOUNTER — Encounter (INDEPENDENT_AMBULATORY_CARE_PROVIDER_SITE_OTHER): Payer: Self-pay | Admitting: Vascular Surgery

## 2018-02-23 VITALS — BP 167/84 | HR 64 | Resp 13 | Ht 66.0 in | Wt 162.0 lb

## 2018-02-23 DIAGNOSIS — N186 End stage renal disease: Secondary | ICD-10-CM | POA: Diagnosis not present

## 2018-02-23 DIAGNOSIS — Z992 Dependence on renal dialysis: Secondary | ICD-10-CM

## 2018-02-23 DIAGNOSIS — Z720 Tobacco use: Secondary | ICD-10-CM | POA: Diagnosis not present

## 2018-02-23 DIAGNOSIS — E785 Hyperlipidemia, unspecified: Secondary | ICD-10-CM | POA: Diagnosis not present

## 2018-02-23 NOTE — Progress Notes (Signed)
Subjective:    Patient ID: Daryl Weeks, male    DOB: [birth year], 57 y.o.   MRN: [id number] Chief Complaint  Patient presents with  . Follow-up    Aneurysm   Patient presents at the request of his dialysis center for evaluation of his left upper extremity growing dialysis access aneurysm site.  As per the patient, his dialysis center has become concerned that his aneurysm has progressively grown and now is dangerously enlarged.  At this time, the patient denies any issues with dialysis. The patient denies any issues with hemodialysis such as cannulation problems, increased bleeding, decrease in doppler flow or recirculation. The patient also denies any fistula skin breakdown, pain, edema, pallor or ulceration of the arm / hand.  The aneurysmal fistula does cause the patient some intermittent discomfort at times and describes it as "tender".  He denies any bleeding episodes.  He denies any fever, nausea or vomiting.  The patient is right-hand dominant.  Review of Systems  Constitutional: Negative.   HENT: Negative.   Eyes: Negative.   Respiratory: Negative.   Cardiovascular: Negative.   Gastrointestinal: Negative.   Endocrine: Negative.   Genitourinary:       ESRD  Musculoskeletal: Negative.   Skin: Negative.   Allergic/Immunologic: Negative.   Neurological: Negative.   Hematological: Negative.   Psychiatric/Behavioral: Negative.       Objective:   Physical Exam  Constitutional: He is oriented to person, place, and time. He appears well-developed and well-nourished. No distress.  HENT:  Head: Normocephalic and atraumatic.  Right Ear: External ear normal.  Left Ear: External ear normal.  Eyes: Pupils are equal, round, and reactive to light. Conjunctivae and EOM are normal.  Neck: Normal range of motion.  Cardiovascular: Normal rate, regular rhythm, normal heart sounds and intact distal pulses.  Pulses:      Radial pulses are 2+ on the right side, and 2+ on the left side.    Left upper extremity dialysis access: Large aneurysmal fistula tracking from the wrist up to the antecubital fossa.  There is skin threatening noted throughout the 2 largest portions of the fistula.  Pulmonary/Chest: Effort normal and breath sounds normal.  Musculoskeletal: Normal range of motion. He exhibits no edema.  Neurological: He is alert and oriented to person, place, and time.  Skin: Skin is warm and dry. He is not diaphoretic.  Psychiatric: He has a normal mood and affect. His behavior is normal. Judgment and thought content normal.  Vitals reviewed.  BP (!) 167/84 (BP Location: Right Arm, Patient Position: Sitting)   Pulse 64   Resp 13   Ht 5\' 6"  (1.676 m)   Wt 162 lb (73.5 kg)   BMI 26.15 kg/m   Past Medical History:  Diagnosis Date  . Anemia   . Anxiety   . Arthritis   . CHF (congestive heart failure) ([place])   . Colon polyps   . COPD (chronic obstructive pulmonary disease) ([place])   . Coronary artery disease    Non-ST elevation myocardial infarction in February of 2015. In the setting of hypertensive urgency and blood loss anemia. Cardiac catheterization showed significant three-vessel coronary artery disease. EF was 55% by echo. He underwent CABG at [facility] on May 19 with LIMA to LAD, Sequential SVG to OM1 and OM2, and SVG to RCA  . End stage renal disease ([place])    m-w-f   -[facility] [place]  . End stage renal disease ([place])   . ETOH abuse   . GERD (  gastroesophageal reflux disease)    hx  . Hyperlipidemia   . Hypertension   . IgA nephropathy   . Lower GI bleed    Due to colon polyps. Status post resection of 14 polyps  . Non-ST elevation MI (NSTEMI) ([place])   . Pneumonia    hx  . Rheumatoid arthritis ([place])   . Shortness of breath   . Sleep apnea   . Tobacco abuse   . Tobacco abuse    Social History   Socioeconomic History  . Marital status: Single    Spouse name: Not on file  . Number of children: Not on file  . Years of education: Not on file  . Highest  education level: 8th grade  Occupational History  . Not on file  Social Needs  . Financial resource strain: Not hard at all  . Food insecurity:    Worry: Never true    Inability: Never true  . Transportation needs:    Medical: No    Non-medical: No  Tobacco Use  . Smoking status: Current Every Day Smoker    Packs/day: 0.50    Years: 32.00    Pack years: 16.00    Types: Cigarettes  . Smokeless tobacco: Never Used  . Tobacco comment: daily  Substance and Sexual Activity  . Alcohol use: Yes    Alcohol/week: 3.6 oz    Types: 6 Cans of beer per week    Comment: weekly  . Drug use: No  . Sexual activity: Not on file  Lifestyle  . Physical activity:    Days per week: 0 days    Minutes per session: 0 min  . Stress: Not at all  Relationships  . Social connections:    Talks on phone: More than three times a week    Gets together: More than three times a week    Attends religious service: Never    Active member of club or organization: No    Attends meetings of clubs or organizations: Never    Relationship status: Divorced  . Intimate partner violence:    Fear of current or ex partner: No    Emotionally abused: No    Physically abused: No    Forced sexual activity: No  Other Topics Concern  . Not on file  Social History Narrative  . Not on file   Past Surgical History:  Procedure Laterality Date  . A/V FISTULAGRAM Left 10/28/2016   Procedure: A/V Fistulagram;  Surgeon: [name], MD;  Location: [facility];  Service: Cardiovascular;  Laterality: Left;  . A/V SHUNT INTERVENTION N/A 10/28/2016   Procedure: A/V Shunt Intervention;  Surgeon: [name], MD;  Location: [facility];  Service: Cardiovascular;  Laterality: N/A;  . AV FISTULA PLACEMENT    . CARDIAC CATHETERIZATION     RCA 90% and calcified mid LAD 80% Stenosis  . CORONARY ARTERY BYPASS GRAFT N/A 11/29/2013   Procedure: CORONARY ARTERY BYPASS GRAFTING (CABG) x 4 using endoscopically harvested  right saphenous vein and left internal mammary artery;  Surgeon: [name], MD;  Location: [facility];  Service: Open Heart Surgery;  Laterality: N/A;  . dialysis catheterr  2/15  . INTRAOPERATIVE TRANSESOPHAGEAL ECHOCARDIOGRAM N/A 11/29/2013   Procedure: INTRAOPERATIVE TRANSESOPHAGEAL ECHOCARDIOGRAM;  Surgeon: [name], MD;  Location: [facility];  Service: Open Heart Surgery;  Laterality: N/A;  . PERIPHERAL VASCULAR CATHETERIZATION N/A 06/12/2015   Procedure: A/V Shuntogram/Fistulagram;  Surgeon: [name], MD;  Location:  [facility] [facility];  Service: Cardiovascular;  Laterality: N/A;  . PERIPHERAL VASCULAR CATHETERIZATION Left 06/12/2015   Procedure: A/V Shunt Intervention;  Surgeon: [name], MD;  Location: [facility];  Service: Cardiovascular;  Laterality: Left;  . RENAL BIOPSY Left 14   Family History  Problem Relation Age of Onset  . Heart disease Father   . Heart disease Brother   . Healthy Sister   . Stroke Neg Hx    No Known Allergies     Assessment & Plan:  Patient presents at the request of his dialysis center for evaluation of his left upper extremity growing dialysis access aneurysm site.  As per the patient, his dialysis center has become concerned that his aneurysm has progressively grown and now is dangerously enlarged.  At this time, the patient denies any issues with dialysis. The patient denies any issues with hemodialysis such as cannulation problems, increased bleeding, decrease in doppler flow or recirculation. The patient also denies any fistula skin breakdown, pain, edema, pallor or ulceration of the arm / hand.  The aneurysmal fistula does cause the patient some intermittent discomfort at times and describes it as "tender".  He denies any bleeding episodes.  He denies any fever, nausea or vomiting.  The patient is right-hand dominant.  1. ESRD on dialysis ([place]) - Stable Patient presents at the request of his dialysis center concerning his progressively  enlarging aneurysmal fistula site. Even though the patient presents today without any issues with dialysis on physical exam he does have a very large aneurysmal fistula to the left forearm which is notable for skin threatening. With the presence of skin threatening noted on exam worrisome for a significant bleeding event and therefore should undergo a bilateral upper extremity venogram in an attempt to assess the patient's anatomy for a planned revision / creation of a new access. The patient is right-hand dominant. Procedure, risks and benefits explained to the patient All questions answered The patient wishes to proceed The patient understands that he will need a permacath placed for approximately 4 to 6 weeks while his new access matures. He can continue to use his access while we plan his venogram and then revision surgery.  2. Tobacco abuse - Stable We had a discussion for approximately 10 minutes regarding the absolute need for smoking cessation due to the deleterious nature of tobacco on the vascular system. We discussed the tobacco use would diminish patency of any intervention, and likely significantly worsen progressio of disease. We discussed multiple agents for quitting including replacement therapy or medications to reduce cravings such as Chantix. The patient voices their understanding of the importance of smoking cessation.  3. Hyperlipidemia, unspecified hyperlipidemia type - Stable Encouraged good control as its slows the progression of atherosclerotic disease  Current Outpatient Medications on File Prior to Visit  Medication Sig Dispense Refill  . albuterol (VENTOLIN HFA) 108 (90 Base) MCG/ACT inhaler Inhale 2 puffs into the lungs every 6 (six) hours as needed for wheezing or shortness of breath.    [name] amLODipine (NORVASC) 10 MG tablet take 1 tablet by mouth once daily 30 tablet 5  . aspirin EC 81 MG tablet Take 1 tablet (81 mg total) by mouth daily. 90 tablet 3  . atorvastatin  (LIPITOR) 20 MG tablet take 1 tablet by mouth at bedtime 30 tablet 6  . AURYXIA 1 GM 210 MG(Fe) tablet TK 1 T PO  TID BEFORE EACH MEAL  11  . budesonide (PULMICORT FLEXHALER) 180 MCG/ACT  inhaler Inhale 1 puff into the lungs 2 (two) times daily. 1 each 11  . budesonide (PULMICORT) 0.5 MG/2ML nebulizer solution Take 2 mLs (0.5 mg total) by nebulization every 12 (twelve) hours. 60 mL 11  . calcium carbonate (TUMS - DOSED IN MG ELEMENTAL CALCIUM) 500 MG chewable tablet Chew 1 tablet by mouth daily.    . cinacalcet (SENSIPAR) 30 MG tablet Take by mouth.    . cloNIDine (CATAPRES) 0.1 MG tablet TK 1 T PO BID FOR HIGH BP  11  . furosemide (LASIX) 80 MG tablet Take 80 mg by mouth daily.    [name] gabapentin (NEURONTIN) 100 MG capsule TK 1 C PO TID  11  . irbesartan (AVAPRO) 150 MG tablet TK 1 T PO D AT NIGHT FOR HIGH BP  11  . irbesartan (AVAPRO) 300 MG tablet TK 1 T PO ATN FOR HIGH BP  0  . lidocaine-prilocaine (EMLA) cream Apply 1 application topically as needed.    . metoprolol tartrate (LOPRESSOR) 100 MG tablet Take 1 tablet (100 mg total) by mouth 2 (two) times daily. 180 tablet 3  . sevelamer carbonate (RENVELA) 800 MG tablet Take 800 mg by mouth 3 (three) times daily with meals.    . tiotropium (SPIRIVA) 18 MCG inhalation capsule Place 1 capsule (18 mcg total) into inhaler and inhale daily. 30 capsule 12  . zolpidem (AMBIEN) 10 MG tablet Take 10 mg by mouth at bedtime as needed for sleep.     Current Facility-Administered Medications on File Prior to Visit  Medication Dose Route Frequency Provider Last Rate Last Dose  . ceFAZolin (ANCEF) IVPB 1 g/50 mL premix  1 g Intravenous Once Dew, Erskine Squibb, MD       There are no Patient Instructions on file for this visit. No follow-ups on file.  Barnard Sharps A Sundance Moise, PA-C

## 2018-02-24 DIAGNOSIS — N2581 Secondary hyperparathyroidism of renal origin: Secondary | ICD-10-CM | POA: Diagnosis not present

## 2018-02-24 DIAGNOSIS — N186 End stage renal disease: Secondary | ICD-10-CM | POA: Diagnosis not present

## 2018-02-24 DIAGNOSIS — Z992 Dependence on renal dialysis: Secondary | ICD-10-CM | POA: Diagnosis not present

## 2018-02-24 DIAGNOSIS — D631 Anemia in chronic kidney disease: Secondary | ICD-10-CM | POA: Diagnosis not present

## 2018-02-24 DIAGNOSIS — D509 Iron deficiency anemia, unspecified: Secondary | ICD-10-CM | POA: Diagnosis not present

## 2018-02-27 DIAGNOSIS — Z992 Dependence on renal dialysis: Secondary | ICD-10-CM | POA: Diagnosis not present

## 2018-02-27 DIAGNOSIS — D631 Anemia in chronic kidney disease: Secondary | ICD-10-CM | POA: Diagnosis not present

## 2018-02-27 DIAGNOSIS — N2581 Secondary hyperparathyroidism of renal origin: Secondary | ICD-10-CM | POA: Diagnosis not present

## 2018-02-27 DIAGNOSIS — N186 End stage renal disease: Secondary | ICD-10-CM | POA: Diagnosis not present

## 2018-02-27 DIAGNOSIS — D509 Iron deficiency anemia, unspecified: Secondary | ICD-10-CM | POA: Diagnosis not present

## 2018-03-01 DIAGNOSIS — Z992 Dependence on renal dialysis: Secondary | ICD-10-CM | POA: Diagnosis not present

## 2018-03-01 DIAGNOSIS — N2581 Secondary hyperparathyroidism of renal origin: Secondary | ICD-10-CM | POA: Diagnosis not present

## 2018-03-01 DIAGNOSIS — N186 End stage renal disease: Secondary | ICD-10-CM | POA: Diagnosis not present

## 2018-03-01 DIAGNOSIS — D631 Anemia in chronic kidney disease: Secondary | ICD-10-CM | POA: Diagnosis not present

## 2018-03-01 DIAGNOSIS — D509 Iron deficiency anemia, unspecified: Secondary | ICD-10-CM | POA: Diagnosis not present

## 2018-03-03 DIAGNOSIS — N186 End stage renal disease: Secondary | ICD-10-CM | POA: Diagnosis not present

## 2018-03-03 DIAGNOSIS — N2581 Secondary hyperparathyroidism of renal origin: Secondary | ICD-10-CM | POA: Diagnosis not present

## 2018-03-03 DIAGNOSIS — D631 Anemia in chronic kidney disease: Secondary | ICD-10-CM | POA: Diagnosis not present

## 2018-03-03 DIAGNOSIS — Z992 Dependence on renal dialysis: Secondary | ICD-10-CM | POA: Diagnosis not present

## 2018-03-03 DIAGNOSIS — D509 Iron deficiency anemia, unspecified: Secondary | ICD-10-CM | POA: Diagnosis not present

## 2018-03-06 DIAGNOSIS — N2581 Secondary hyperparathyroidism of renal origin: Secondary | ICD-10-CM | POA: Diagnosis not present

## 2018-03-06 DIAGNOSIS — D631 Anemia in chronic kidney disease: Secondary | ICD-10-CM | POA: Diagnosis not present

## 2018-03-06 DIAGNOSIS — N186 End stage renal disease: Secondary | ICD-10-CM | POA: Diagnosis not present

## 2018-03-06 DIAGNOSIS — Z992 Dependence on renal dialysis: Secondary | ICD-10-CM | POA: Diagnosis not present

## 2018-03-06 DIAGNOSIS — D509 Iron deficiency anemia, unspecified: Secondary | ICD-10-CM | POA: Diagnosis not present

## 2018-03-08 DIAGNOSIS — N2581 Secondary hyperparathyroidism of renal origin: Secondary | ICD-10-CM | POA: Diagnosis not present

## 2018-03-08 DIAGNOSIS — N186 End stage renal disease: Secondary | ICD-10-CM | POA: Diagnosis not present

## 2018-03-08 DIAGNOSIS — D509 Iron deficiency anemia, unspecified: Secondary | ICD-10-CM | POA: Diagnosis not present

## 2018-03-08 DIAGNOSIS — D631 Anemia in chronic kidney disease: Secondary | ICD-10-CM | POA: Diagnosis not present

## 2018-03-08 DIAGNOSIS — Z992 Dependence on renal dialysis: Secondary | ICD-10-CM | POA: Diagnosis not present

## 2018-03-09 ENCOUNTER — Ambulatory Visit: Admit: 2018-03-09 | Payer: Medicare Other | Admitting: Vascular Surgery

## 2018-03-09 SURGERY — UPPER EXTREMITY VENOGRAPHY
Anesthesia: Moderate Sedation | Laterality: Bilateral

## 2018-03-10 DIAGNOSIS — Z992 Dependence on renal dialysis: Secondary | ICD-10-CM | POA: Diagnosis not present

## 2018-03-10 DIAGNOSIS — D631 Anemia in chronic kidney disease: Secondary | ICD-10-CM | POA: Diagnosis not present

## 2018-03-10 DIAGNOSIS — N2581 Secondary hyperparathyroidism of renal origin: Secondary | ICD-10-CM | POA: Diagnosis not present

## 2018-03-10 DIAGNOSIS — D509 Iron deficiency anemia, unspecified: Secondary | ICD-10-CM | POA: Diagnosis not present

## 2018-03-10 DIAGNOSIS — N186 End stage renal disease: Secondary | ICD-10-CM | POA: Diagnosis not present

## 2018-03-12 DIAGNOSIS — Z992 Dependence on renal dialysis: Secondary | ICD-10-CM | POA: Diagnosis not present

## 2018-03-12 DIAGNOSIS — N186 End stage renal disease: Secondary | ICD-10-CM | POA: Diagnosis not present

## 2018-03-13 DIAGNOSIS — N2581 Secondary hyperparathyroidism of renal origin: Secondary | ICD-10-CM | POA: Diagnosis not present

## 2018-03-13 DIAGNOSIS — Z992 Dependence on renal dialysis: Secondary | ICD-10-CM | POA: Diagnosis not present

## 2018-03-13 DIAGNOSIS — D631 Anemia in chronic kidney disease: Secondary | ICD-10-CM | POA: Diagnosis not present

## 2018-03-13 DIAGNOSIS — N186 End stage renal disease: Secondary | ICD-10-CM | POA: Diagnosis not present

## 2018-03-13 DIAGNOSIS — D509 Iron deficiency anemia, unspecified: Secondary | ICD-10-CM | POA: Diagnosis not present

## 2018-03-15 DIAGNOSIS — Z992 Dependence on renal dialysis: Secondary | ICD-10-CM | POA: Diagnosis not present

## 2018-03-15 DIAGNOSIS — N2581 Secondary hyperparathyroidism of renal origin: Secondary | ICD-10-CM | POA: Diagnosis not present

## 2018-03-15 DIAGNOSIS — D509 Iron deficiency anemia, unspecified: Secondary | ICD-10-CM | POA: Diagnosis not present

## 2018-03-15 DIAGNOSIS — D631 Anemia in chronic kidney disease: Secondary | ICD-10-CM | POA: Diagnosis not present

## 2018-03-15 DIAGNOSIS — N186 End stage renal disease: Secondary | ICD-10-CM | POA: Diagnosis not present

## 2018-03-17 DIAGNOSIS — Z992 Dependence on renal dialysis: Secondary | ICD-10-CM | POA: Diagnosis not present

## 2018-03-17 DIAGNOSIS — D631 Anemia in chronic kidney disease: Secondary | ICD-10-CM | POA: Diagnosis not present

## 2018-03-17 DIAGNOSIS — D509 Iron deficiency anemia, unspecified: Secondary | ICD-10-CM | POA: Diagnosis not present

## 2018-03-17 DIAGNOSIS — N2581 Secondary hyperparathyroidism of renal origin: Secondary | ICD-10-CM | POA: Diagnosis not present

## 2018-03-17 DIAGNOSIS — N186 End stage renal disease: Secondary | ICD-10-CM | POA: Diagnosis not present

## 2018-03-20 DIAGNOSIS — D509 Iron deficiency anemia, unspecified: Secondary | ICD-10-CM | POA: Diagnosis not present

## 2018-03-20 DIAGNOSIS — Z992 Dependence on renal dialysis: Secondary | ICD-10-CM | POA: Diagnosis not present

## 2018-03-20 DIAGNOSIS — D631 Anemia in chronic kidney disease: Secondary | ICD-10-CM | POA: Diagnosis not present

## 2018-03-20 DIAGNOSIS — N186 End stage renal disease: Secondary | ICD-10-CM | POA: Diagnosis not present

## 2018-03-20 DIAGNOSIS — N2581 Secondary hyperparathyroidism of renal origin: Secondary | ICD-10-CM | POA: Diagnosis not present

## 2018-03-22 DIAGNOSIS — N2581 Secondary hyperparathyroidism of renal origin: Secondary | ICD-10-CM | POA: Diagnosis not present

## 2018-03-22 DIAGNOSIS — D509 Iron deficiency anemia, unspecified: Secondary | ICD-10-CM | POA: Diagnosis not present

## 2018-03-22 DIAGNOSIS — N186 End stage renal disease: Secondary | ICD-10-CM | POA: Diagnosis not present

## 2018-03-22 DIAGNOSIS — Z992 Dependence on renal dialysis: Secondary | ICD-10-CM | POA: Diagnosis not present

## 2018-03-22 DIAGNOSIS — D631 Anemia in chronic kidney disease: Secondary | ICD-10-CM | POA: Diagnosis not present

## 2018-03-24 DIAGNOSIS — N186 End stage renal disease: Secondary | ICD-10-CM | POA: Diagnosis not present

## 2018-03-24 DIAGNOSIS — D509 Iron deficiency anemia, unspecified: Secondary | ICD-10-CM | POA: Diagnosis not present

## 2018-03-24 DIAGNOSIS — N2581 Secondary hyperparathyroidism of renal origin: Secondary | ICD-10-CM | POA: Diagnosis not present

## 2018-03-24 DIAGNOSIS — D631 Anemia in chronic kidney disease: Secondary | ICD-10-CM | POA: Diagnosis not present

## 2018-03-24 DIAGNOSIS — Z992 Dependence on renal dialysis: Secondary | ICD-10-CM | POA: Diagnosis not present

## 2018-03-27 DIAGNOSIS — N2581 Secondary hyperparathyroidism of renal origin: Secondary | ICD-10-CM | POA: Diagnosis not present

## 2018-03-27 DIAGNOSIS — Z992 Dependence on renal dialysis: Secondary | ICD-10-CM | POA: Diagnosis not present

## 2018-03-27 DIAGNOSIS — D509 Iron deficiency anemia, unspecified: Secondary | ICD-10-CM | POA: Diagnosis not present

## 2018-03-27 DIAGNOSIS — D631 Anemia in chronic kidney disease: Secondary | ICD-10-CM | POA: Diagnosis not present

## 2018-03-27 DIAGNOSIS — N186 End stage renal disease: Secondary | ICD-10-CM | POA: Diagnosis not present

## 2018-03-29 DIAGNOSIS — Z992 Dependence on renal dialysis: Secondary | ICD-10-CM | POA: Diagnosis not present

## 2018-03-29 DIAGNOSIS — N186 End stage renal disease: Secondary | ICD-10-CM | POA: Diagnosis not present

## 2018-03-29 DIAGNOSIS — D509 Iron deficiency anemia, unspecified: Secondary | ICD-10-CM | POA: Diagnosis not present

## 2018-03-29 DIAGNOSIS — D631 Anemia in chronic kidney disease: Secondary | ICD-10-CM | POA: Diagnosis not present

## 2018-03-29 DIAGNOSIS — N2581 Secondary hyperparathyroidism of renal origin: Secondary | ICD-10-CM | POA: Diagnosis not present

## 2018-03-31 DIAGNOSIS — N2581 Secondary hyperparathyroidism of renal origin: Secondary | ICD-10-CM | POA: Diagnosis not present

## 2018-03-31 DIAGNOSIS — N186 End stage renal disease: Secondary | ICD-10-CM | POA: Diagnosis not present

## 2018-03-31 DIAGNOSIS — D509 Iron deficiency anemia, unspecified: Secondary | ICD-10-CM | POA: Diagnosis not present

## 2018-03-31 DIAGNOSIS — D631 Anemia in chronic kidney disease: Secondary | ICD-10-CM | POA: Diagnosis not present

## 2018-03-31 DIAGNOSIS — Z992 Dependence on renal dialysis: Secondary | ICD-10-CM | POA: Diagnosis not present

## 2018-04-03 DIAGNOSIS — D631 Anemia in chronic kidney disease: Secondary | ICD-10-CM | POA: Diagnosis not present

## 2018-04-03 DIAGNOSIS — N186 End stage renal disease: Secondary | ICD-10-CM | POA: Diagnosis not present

## 2018-04-03 DIAGNOSIS — N2581 Secondary hyperparathyroidism of renal origin: Secondary | ICD-10-CM | POA: Diagnosis not present

## 2018-04-03 DIAGNOSIS — D509 Iron deficiency anemia, unspecified: Secondary | ICD-10-CM | POA: Diagnosis not present

## 2018-04-03 DIAGNOSIS — Z992 Dependence on renal dialysis: Secondary | ICD-10-CM | POA: Diagnosis not present

## 2018-04-04 DIAGNOSIS — T82898A Other specified complication of vascular prosthetic devices, implants and grafts, initial encounter: Secondary | ICD-10-CM | POA: Diagnosis not present

## 2018-04-04 DIAGNOSIS — N186 End stage renal disease: Secondary | ICD-10-CM | POA: Diagnosis not present

## 2018-04-04 DIAGNOSIS — Z992 Dependence on renal dialysis: Secondary | ICD-10-CM | POA: Diagnosis not present

## 2018-04-05 DIAGNOSIS — D509 Iron deficiency anemia, unspecified: Secondary | ICD-10-CM | POA: Diagnosis not present

## 2018-04-05 DIAGNOSIS — N186 End stage renal disease: Secondary | ICD-10-CM | POA: Diagnosis not present

## 2018-04-05 DIAGNOSIS — N2581 Secondary hyperparathyroidism of renal origin: Secondary | ICD-10-CM | POA: Diagnosis not present

## 2018-04-05 DIAGNOSIS — Z992 Dependence on renal dialysis: Secondary | ICD-10-CM | POA: Diagnosis not present

## 2018-04-05 DIAGNOSIS — D631 Anemia in chronic kidney disease: Secondary | ICD-10-CM | POA: Diagnosis not present

## 2018-04-07 DIAGNOSIS — Z992 Dependence on renal dialysis: Secondary | ICD-10-CM | POA: Diagnosis not present

## 2018-04-07 DIAGNOSIS — N2581 Secondary hyperparathyroidism of renal origin: Secondary | ICD-10-CM | POA: Diagnosis not present

## 2018-04-07 DIAGNOSIS — D631 Anemia in chronic kidney disease: Secondary | ICD-10-CM | POA: Diagnosis not present

## 2018-04-07 DIAGNOSIS — N186 End stage renal disease: Secondary | ICD-10-CM | POA: Diagnosis not present

## 2018-04-07 DIAGNOSIS — D509 Iron deficiency anemia, unspecified: Secondary | ICD-10-CM | POA: Diagnosis not present

## 2018-04-10 DIAGNOSIS — D631 Anemia in chronic kidney disease: Secondary | ICD-10-CM | POA: Diagnosis not present

## 2018-04-10 DIAGNOSIS — D509 Iron deficiency anemia, unspecified: Secondary | ICD-10-CM | POA: Diagnosis not present

## 2018-04-10 DIAGNOSIS — Z992 Dependence on renal dialysis: Secondary | ICD-10-CM | POA: Diagnosis not present

## 2018-04-10 DIAGNOSIS — N2581 Secondary hyperparathyroidism of renal origin: Secondary | ICD-10-CM | POA: Diagnosis not present

## 2018-04-10 DIAGNOSIS — N186 End stage renal disease: Secondary | ICD-10-CM | POA: Diagnosis not present

## 2018-04-12 DIAGNOSIS — N2581 Secondary hyperparathyroidism of renal origin: Secondary | ICD-10-CM | POA: Diagnosis not present

## 2018-04-12 DIAGNOSIS — D631 Anemia in chronic kidney disease: Secondary | ICD-10-CM | POA: Diagnosis not present

## 2018-04-12 DIAGNOSIS — Z992 Dependence on renal dialysis: Secondary | ICD-10-CM | POA: Diagnosis not present

## 2018-04-12 DIAGNOSIS — N186 End stage renal disease: Secondary | ICD-10-CM | POA: Diagnosis not present

## 2018-04-12 DIAGNOSIS — D509 Iron deficiency anemia, unspecified: Secondary | ICD-10-CM | POA: Diagnosis not present

## 2018-04-14 DIAGNOSIS — D631 Anemia in chronic kidney disease: Secondary | ICD-10-CM | POA: Diagnosis not present

## 2018-04-14 DIAGNOSIS — D509 Iron deficiency anemia, unspecified: Secondary | ICD-10-CM | POA: Diagnosis not present

## 2018-04-14 DIAGNOSIS — R110 Nausea: Secondary | ICD-10-CM | POA: Diagnosis not present

## 2018-04-14 DIAGNOSIS — Z992 Dependence on renal dialysis: Secondary | ICD-10-CM | POA: Diagnosis not present

## 2018-04-14 DIAGNOSIS — N2581 Secondary hyperparathyroidism of renal origin: Secondary | ICD-10-CM | POA: Diagnosis not present

## 2018-04-14 DIAGNOSIS — R1110 Vomiting, unspecified: Secondary | ICD-10-CM | POA: Diagnosis not present

## 2018-04-14 DIAGNOSIS — N186 End stage renal disease: Secondary | ICD-10-CM | POA: Diagnosis not present

## 2018-04-17 DIAGNOSIS — R110 Nausea: Secondary | ICD-10-CM | POA: Diagnosis not present

## 2018-04-17 DIAGNOSIS — N186 End stage renal disease: Secondary | ICD-10-CM | POA: Diagnosis not present

## 2018-04-17 DIAGNOSIS — Z992 Dependence on renal dialysis: Secondary | ICD-10-CM | POA: Diagnosis not present

## 2018-04-17 DIAGNOSIS — R1110 Vomiting, unspecified: Secondary | ICD-10-CM | POA: Diagnosis not present

## 2018-04-17 DIAGNOSIS — N2581 Secondary hyperparathyroidism of renal origin: Secondary | ICD-10-CM | POA: Diagnosis not present

## 2018-04-17 DIAGNOSIS — D509 Iron deficiency anemia, unspecified: Secondary | ICD-10-CM | POA: Diagnosis not present

## 2018-04-19 DIAGNOSIS — R1110 Vomiting, unspecified: Secondary | ICD-10-CM | POA: Diagnosis not present

## 2018-04-19 DIAGNOSIS — D509 Iron deficiency anemia, unspecified: Secondary | ICD-10-CM | POA: Diagnosis not present

## 2018-04-19 DIAGNOSIS — N186 End stage renal disease: Secondary | ICD-10-CM | POA: Diagnosis not present

## 2018-04-19 DIAGNOSIS — Z992 Dependence on renal dialysis: Secondary | ICD-10-CM | POA: Diagnosis not present

## 2018-04-19 DIAGNOSIS — N2581 Secondary hyperparathyroidism of renal origin: Secondary | ICD-10-CM | POA: Diagnosis not present

## 2018-04-19 DIAGNOSIS — R110 Nausea: Secondary | ICD-10-CM | POA: Diagnosis not present

## 2018-04-21 DIAGNOSIS — R1110 Vomiting, unspecified: Secondary | ICD-10-CM | POA: Diagnosis not present

## 2018-04-21 DIAGNOSIS — N2581 Secondary hyperparathyroidism of renal origin: Secondary | ICD-10-CM | POA: Diagnosis not present

## 2018-04-21 DIAGNOSIS — R110 Nausea: Secondary | ICD-10-CM | POA: Diagnosis not present

## 2018-04-21 DIAGNOSIS — N186 End stage renal disease: Secondary | ICD-10-CM | POA: Diagnosis not present

## 2018-04-21 DIAGNOSIS — D509 Iron deficiency anemia, unspecified: Secondary | ICD-10-CM | POA: Diagnosis not present

## 2018-04-21 DIAGNOSIS — Z992 Dependence on renal dialysis: Secondary | ICD-10-CM | POA: Diagnosis not present

## 2018-04-24 DIAGNOSIS — R110 Nausea: Secondary | ICD-10-CM | POA: Diagnosis not present

## 2018-04-24 DIAGNOSIS — N186 End stage renal disease: Secondary | ICD-10-CM | POA: Diagnosis not present

## 2018-04-24 DIAGNOSIS — D509 Iron deficiency anemia, unspecified: Secondary | ICD-10-CM | POA: Diagnosis not present

## 2018-04-24 DIAGNOSIS — Z992 Dependence on renal dialysis: Secondary | ICD-10-CM | POA: Diagnosis not present

## 2018-04-24 DIAGNOSIS — R1110 Vomiting, unspecified: Secondary | ICD-10-CM | POA: Diagnosis not present

## 2018-04-24 DIAGNOSIS — N2581 Secondary hyperparathyroidism of renal origin: Secondary | ICD-10-CM | POA: Diagnosis not present

## 2018-04-26 DIAGNOSIS — N186 End stage renal disease: Secondary | ICD-10-CM | POA: Diagnosis not present

## 2018-04-26 DIAGNOSIS — N2581 Secondary hyperparathyroidism of renal origin: Secondary | ICD-10-CM | POA: Diagnosis not present

## 2018-04-26 DIAGNOSIS — R1110 Vomiting, unspecified: Secondary | ICD-10-CM | POA: Diagnosis not present

## 2018-04-26 DIAGNOSIS — R110 Nausea: Secondary | ICD-10-CM | POA: Diagnosis not present

## 2018-04-26 DIAGNOSIS — D509 Iron deficiency anemia, unspecified: Secondary | ICD-10-CM | POA: Diagnosis not present

## 2018-04-26 DIAGNOSIS — Z992 Dependence on renal dialysis: Secondary | ICD-10-CM | POA: Diagnosis not present

## 2018-04-28 DIAGNOSIS — R1110 Vomiting, unspecified: Secondary | ICD-10-CM | POA: Diagnosis not present

## 2018-04-28 DIAGNOSIS — N2581 Secondary hyperparathyroidism of renal origin: Secondary | ICD-10-CM | POA: Diagnosis not present

## 2018-04-28 DIAGNOSIS — Z992 Dependence on renal dialysis: Secondary | ICD-10-CM | POA: Diagnosis not present

## 2018-04-28 DIAGNOSIS — D509 Iron deficiency anemia, unspecified: Secondary | ICD-10-CM | POA: Diagnosis not present

## 2018-04-28 DIAGNOSIS — R110 Nausea: Secondary | ICD-10-CM | POA: Diagnosis not present

## 2018-04-28 DIAGNOSIS — N186 End stage renal disease: Secondary | ICD-10-CM | POA: Diagnosis not present

## 2018-05-01 DIAGNOSIS — R110 Nausea: Secondary | ICD-10-CM | POA: Diagnosis not present

## 2018-05-01 DIAGNOSIS — N2581 Secondary hyperparathyroidism of renal origin: Secondary | ICD-10-CM | POA: Diagnosis not present

## 2018-05-01 DIAGNOSIS — D509 Iron deficiency anemia, unspecified: Secondary | ICD-10-CM | POA: Diagnosis not present

## 2018-05-01 DIAGNOSIS — Z992 Dependence on renal dialysis: Secondary | ICD-10-CM | POA: Diagnosis not present

## 2018-05-01 DIAGNOSIS — R1110 Vomiting, unspecified: Secondary | ICD-10-CM | POA: Diagnosis not present

## 2018-05-01 DIAGNOSIS — N186 End stage renal disease: Secondary | ICD-10-CM | POA: Diagnosis not present

## 2018-05-03 DIAGNOSIS — D509 Iron deficiency anemia, unspecified: Secondary | ICD-10-CM | POA: Diagnosis not present

## 2018-05-03 DIAGNOSIS — Z992 Dependence on renal dialysis: Secondary | ICD-10-CM | POA: Diagnosis not present

## 2018-05-03 DIAGNOSIS — N2581 Secondary hyperparathyroidism of renal origin: Secondary | ICD-10-CM | POA: Diagnosis not present

## 2018-05-03 DIAGNOSIS — R1110 Vomiting, unspecified: Secondary | ICD-10-CM | POA: Diagnosis not present

## 2018-05-03 DIAGNOSIS — N186 End stage renal disease: Secondary | ICD-10-CM | POA: Diagnosis not present

## 2018-05-03 DIAGNOSIS — R110 Nausea: Secondary | ICD-10-CM | POA: Diagnosis not present

## 2018-05-05 DIAGNOSIS — D509 Iron deficiency anemia, unspecified: Secondary | ICD-10-CM | POA: Diagnosis not present

## 2018-05-05 DIAGNOSIS — N186 End stage renal disease: Secondary | ICD-10-CM | POA: Diagnosis not present

## 2018-05-05 DIAGNOSIS — R110 Nausea: Secondary | ICD-10-CM | POA: Diagnosis not present

## 2018-05-05 DIAGNOSIS — Z992 Dependence on renal dialysis: Secondary | ICD-10-CM | POA: Diagnosis not present

## 2018-05-05 DIAGNOSIS — R1110 Vomiting, unspecified: Secondary | ICD-10-CM | POA: Diagnosis not present

## 2018-05-05 DIAGNOSIS — N2581 Secondary hyperparathyroidism of renal origin: Secondary | ICD-10-CM | POA: Diagnosis not present

## 2018-05-08 DIAGNOSIS — Z992 Dependence on renal dialysis: Secondary | ICD-10-CM | POA: Diagnosis not present

## 2018-05-08 DIAGNOSIS — R110 Nausea: Secondary | ICD-10-CM | POA: Diagnosis not present

## 2018-05-08 DIAGNOSIS — R1110 Vomiting, unspecified: Secondary | ICD-10-CM | POA: Diagnosis not present

## 2018-05-08 DIAGNOSIS — D509 Iron deficiency anemia, unspecified: Secondary | ICD-10-CM | POA: Diagnosis not present

## 2018-05-08 DIAGNOSIS — N186 End stage renal disease: Secondary | ICD-10-CM | POA: Diagnosis not present

## 2018-05-08 DIAGNOSIS — N2581 Secondary hyperparathyroidism of renal origin: Secondary | ICD-10-CM | POA: Diagnosis not present

## 2018-05-10 DIAGNOSIS — D509 Iron deficiency anemia, unspecified: Secondary | ICD-10-CM | POA: Diagnosis not present

## 2018-05-10 DIAGNOSIS — R110 Nausea: Secondary | ICD-10-CM | POA: Diagnosis not present

## 2018-05-10 DIAGNOSIS — N2581 Secondary hyperparathyroidism of renal origin: Secondary | ICD-10-CM | POA: Diagnosis not present

## 2018-05-10 DIAGNOSIS — N186 End stage renal disease: Secondary | ICD-10-CM | POA: Diagnosis not present

## 2018-05-10 DIAGNOSIS — Z992 Dependence on renal dialysis: Secondary | ICD-10-CM | POA: Diagnosis not present

## 2018-05-10 DIAGNOSIS — R1110 Vomiting, unspecified: Secondary | ICD-10-CM | POA: Diagnosis not present

## 2018-05-12 DIAGNOSIS — N2581 Secondary hyperparathyroidism of renal origin: Secondary | ICD-10-CM | POA: Diagnosis not present

## 2018-05-12 DIAGNOSIS — D509 Iron deficiency anemia, unspecified: Secondary | ICD-10-CM | POA: Diagnosis not present

## 2018-05-12 DIAGNOSIS — N186 End stage renal disease: Secondary | ICD-10-CM | POA: Diagnosis not present

## 2018-05-12 DIAGNOSIS — R1110 Vomiting, unspecified: Secondary | ICD-10-CM | POA: Diagnosis not present

## 2018-05-12 DIAGNOSIS — R110 Nausea: Secondary | ICD-10-CM | POA: Diagnosis not present

## 2018-05-12 DIAGNOSIS — Z992 Dependence on renal dialysis: Secondary | ICD-10-CM | POA: Diagnosis not present

## 2018-05-13 DIAGNOSIS — N186 End stage renal disease: Secondary | ICD-10-CM | POA: Diagnosis not present

## 2018-05-13 DIAGNOSIS — Z992 Dependence on renal dialysis: Secondary | ICD-10-CM | POA: Diagnosis not present

## 2018-05-15 DIAGNOSIS — Z992 Dependence on renal dialysis: Secondary | ICD-10-CM | POA: Diagnosis not present

## 2018-05-15 DIAGNOSIS — N186 End stage renal disease: Secondary | ICD-10-CM | POA: Diagnosis not present

## 2018-05-15 DIAGNOSIS — N2581 Secondary hyperparathyroidism of renal origin: Secondary | ICD-10-CM | POA: Diagnosis not present

## 2018-05-15 DIAGNOSIS — D509 Iron deficiency anemia, unspecified: Secondary | ICD-10-CM | POA: Diagnosis not present

## 2018-05-15 DIAGNOSIS — R1110 Vomiting, unspecified: Secondary | ICD-10-CM | POA: Diagnosis not present

## 2018-05-15 DIAGNOSIS — D631 Anemia in chronic kidney disease: Secondary | ICD-10-CM | POA: Diagnosis not present

## 2018-05-15 DIAGNOSIS — R110 Nausea: Secondary | ICD-10-CM | POA: Diagnosis not present

## 2018-05-17 DIAGNOSIS — N2581 Secondary hyperparathyroidism of renal origin: Secondary | ICD-10-CM | POA: Diagnosis not present

## 2018-05-17 DIAGNOSIS — Z992 Dependence on renal dialysis: Secondary | ICD-10-CM | POA: Diagnosis not present

## 2018-05-17 DIAGNOSIS — N186 End stage renal disease: Secondary | ICD-10-CM | POA: Diagnosis not present

## 2018-05-17 DIAGNOSIS — R110 Nausea: Secondary | ICD-10-CM | POA: Diagnosis not present

## 2018-05-17 DIAGNOSIS — R1110 Vomiting, unspecified: Secondary | ICD-10-CM | POA: Diagnosis not present

## 2018-05-17 DIAGNOSIS — D509 Iron deficiency anemia, unspecified: Secondary | ICD-10-CM | POA: Diagnosis not present

## 2018-05-19 DIAGNOSIS — R1110 Vomiting, unspecified: Secondary | ICD-10-CM | POA: Diagnosis not present

## 2018-05-19 DIAGNOSIS — R110 Nausea: Secondary | ICD-10-CM | POA: Diagnosis not present

## 2018-05-19 DIAGNOSIS — N186 End stage renal disease: Secondary | ICD-10-CM | POA: Diagnosis not present

## 2018-05-19 DIAGNOSIS — N2581 Secondary hyperparathyroidism of renal origin: Secondary | ICD-10-CM | POA: Diagnosis not present

## 2018-05-19 DIAGNOSIS — Z992 Dependence on renal dialysis: Secondary | ICD-10-CM | POA: Diagnosis not present

## 2018-05-19 DIAGNOSIS — D509 Iron deficiency anemia, unspecified: Secondary | ICD-10-CM | POA: Diagnosis not present

## 2018-05-22 DIAGNOSIS — R110 Nausea: Secondary | ICD-10-CM | POA: Diagnosis not present

## 2018-05-22 DIAGNOSIS — N186 End stage renal disease: Secondary | ICD-10-CM | POA: Diagnosis not present

## 2018-05-22 DIAGNOSIS — N2581 Secondary hyperparathyroidism of renal origin: Secondary | ICD-10-CM | POA: Diagnosis not present

## 2018-05-22 DIAGNOSIS — R1110 Vomiting, unspecified: Secondary | ICD-10-CM | POA: Diagnosis not present

## 2018-05-22 DIAGNOSIS — Z992 Dependence on renal dialysis: Secondary | ICD-10-CM | POA: Diagnosis not present

## 2018-05-22 DIAGNOSIS — D509 Iron deficiency anemia, unspecified: Secondary | ICD-10-CM | POA: Diagnosis not present

## 2018-05-24 DIAGNOSIS — R110 Nausea: Secondary | ICD-10-CM | POA: Diagnosis not present

## 2018-05-24 DIAGNOSIS — N186 End stage renal disease: Secondary | ICD-10-CM | POA: Diagnosis not present

## 2018-05-24 DIAGNOSIS — D509 Iron deficiency anemia, unspecified: Secondary | ICD-10-CM | POA: Diagnosis not present

## 2018-05-24 DIAGNOSIS — N2581 Secondary hyperparathyroidism of renal origin: Secondary | ICD-10-CM | POA: Diagnosis not present

## 2018-05-24 DIAGNOSIS — R1110 Vomiting, unspecified: Secondary | ICD-10-CM | POA: Diagnosis not present

## 2018-05-24 DIAGNOSIS — Z992 Dependence on renal dialysis: Secondary | ICD-10-CM | POA: Diagnosis not present

## 2018-05-24 NOTE — Progress Notes (Deleted)
[facility] Consultation      Assessment and Plan:  57 year old smoker with COPD and end-stage renal disease on hemodialysis.  COPD/emphysema -Moderate emphysema, made worse with chronic bronchitis with continued smoking. Patient notes that the Pulmicort inhaler has not been helpful, and his breathing has continued to decline over the last several months. -He is started on Spiriva inhaler once daily, he can escalate up further as needed, he is told that he can stop his Pulmicort inhaler.  Nicotine abuse. -Discussed the importance of smoking cessation, spent greater than 3 minutes in discussion.    Date: 05/24/2018  MRN# [id number] Daryl Weeks [birth year]    Daryl Weeks is a 57 y.o. old male seen in consultation for chief complaint of:    No chief complaint on file.   HPI:   The patient is a 57 year old male with a history of COPD, tobacco abuse, coronary artery disease, kidney transplant.  At last visit he was noted to have continued dyspnea symptoms on Pulmicort inhaler, therefore he was changed to Spiriva.  **chest x-ray 04/17/2016>> lungs are hyperinflated consistent with emphysema. There are also changes of chronic bronchitis, mild right heart enlargement.  **PFT 06/24/15 @[facility]  Moderate/severe obstruction that is partly reversible and a decreased DLCO suggestive of emphysema  **Echo [facility] 12/2014 Study Conclusions - Left ventricle: The cavity size was normal. Systolic function was normal. The estimated ejection fraction was in the range of 55% to 60%. Wall motion was normal; there were no regional wall motion abnormalities. Left ventricular diastolic function parameters were normal.  **CT chest 08/19/15 IMPRESSION:  1. Mild emphysema.    2. Cardiomegaly.    3. Mildly dilated ascending aorta measuring 4.1 cm   Medication:    Current Outpatient Medications:  .  albuterol (VENTOLIN HFA) 108 (90 Base) MCG/ACT inhaler, Inhale 2 puffs  into the lungs every 6 (six) hours as needed for wheezing or shortness of breath., Disp: , Rfl:  .  amLODipine (NORVASC) 10 MG tablet, take 1 tablet by mouth once daily, Disp: 30 tablet, Rfl: 5 .  aspirin EC 81 MG tablet, Take 1 tablet (81 mg total) by mouth daily., Disp: 90 tablet, Rfl: 3 .  atorvastatin (LIPITOR) 20 MG tablet, take 1 tablet by mouth at bedtime, Disp: 30 tablet, Rfl: 6 .  AURYXIA 1 GM 210 MG(Fe) tablet, TK 1 T PO  TID BEFORE EACH MEAL, Disp: , Rfl: 11 .  budesonide (PULMICORT FLEXHALER) 180 MCG/ACT inhaler, Inhale 1 puff into the lungs 2 (two) times daily., Disp: 1 each, Rfl: 11 .  budesonide (PULMICORT) 0.5 MG/2ML nebulizer solution, Take 2 mLs (0.5 mg total) by nebulization every 12 (twelve) hours., Disp: 60 mL, Rfl: 11 .  calcium carbonate (TUMS - DOSED IN MG ELEMENTAL CALCIUM) 500 MG chewable tablet, Chew 1 tablet by mouth daily., Disp: , Rfl:  .  cinacalcet (SENSIPAR) 30 MG tablet, Take by mouth., Disp: , Rfl:  .  cloNIDine (CATAPRES) 0.1 MG tablet, TK 1 T PO BID FOR HIGH BP, Disp: , Rfl: 11 .  furosemide (LASIX) 80 MG tablet, Take 80 mg by mouth daily., Disp: , Rfl:  .  gabapentin (NEURONTIN) 100 MG capsule, TK 1 C PO TID, Disp: , Rfl: 11 .  irbesartan (AVAPRO) 150 MG tablet, TK 1 T PO D AT NIGHT FOR HIGH BP, Disp: , Rfl: 11 .  irbesartan (AVAPRO) 300 MG tablet, TK 1 T PO ATN FOR HIGH BP, Disp: , Rfl: 0 .  lidocaine-prilocaine (EMLA)  cream, Apply 1 application topically as needed., Disp: , Rfl:  .  metoprolol tartrate (LOPRESSOR) 100 MG tablet, Take 1 tablet (100 mg total) by mouth 2 (two) times daily., Disp: 180 tablet, Rfl: 3 .  sevelamer carbonate (RENVELA) 800 MG tablet, Take 800 mg by mouth 3 (three) times daily with meals., Disp: , Rfl:  .  tiotropium (SPIRIVA) 18 MCG inhalation capsule, Place 1 capsule (18 mcg total) into inhaler and inhale daily., Disp: 30 capsule, Rfl: 12 .  zolpidem (AMBIEN) 10 MG tablet, Take 10 mg by mouth at bedtime as needed for sleep., Disp: ,  Rfl:   Current Facility-Administered Medications:  .  ceFAZolin (ANCEF) IVPB 1 g/50 mL premix, 1 g, Intravenous, Once, Dew, Erskine Squibb, MD   Allergies:  Patient has no known allergies.      LABORATORY PANEL:   CBC No results for input(s): WBC, HGB, HCT, PLT in the last 168 hours. ------------------------------------------------------------------------------------------------------------------  Chemistries  No results for input(s): NA, K, CL, CO2, GLUCOSE, BUN, CREATININE, CALCIUM, MG, AST, ALT, ALKPHOS, BILITOT in the last 168 hours.  Invalid input(s): GFRCGP ------------------------------------------------------------------------------------------------------------------  Cardiac Enzymes No results for input(s): TROPONINI in the last 168 hours. ------------------------------------------------------------  RADIOLOGY:  No results found.     Thank  you for the consultation and for allowing [facility] to assist in the care of your patient. Our recommendations are noted above.  Please contact us if we can be of further service.  Marda Stalker, M.D., F.C.C.P.  Board Certified in Internal Medicine, Pulmonary Medicine, [place], and Sleep Medicine.  [facility] Office Number: [phone]   05/24/2018

## 2018-05-25 ENCOUNTER — Ambulatory Visit: Payer: Medicare Other | Admitting: Internal Medicine

## 2018-05-26 DIAGNOSIS — N186 End stage renal disease: Secondary | ICD-10-CM | POA: Diagnosis not present

## 2018-05-26 DIAGNOSIS — R110 Nausea: Secondary | ICD-10-CM | POA: Diagnosis not present

## 2018-05-26 DIAGNOSIS — Z992 Dependence on renal dialysis: Secondary | ICD-10-CM | POA: Diagnosis not present

## 2018-05-26 DIAGNOSIS — N2581 Secondary hyperparathyroidism of renal origin: Secondary | ICD-10-CM | POA: Diagnosis not present

## 2018-05-26 DIAGNOSIS — D509 Iron deficiency anemia, unspecified: Secondary | ICD-10-CM | POA: Diagnosis not present

## 2018-05-26 DIAGNOSIS — R1110 Vomiting, unspecified: Secondary | ICD-10-CM | POA: Diagnosis not present

## 2018-05-29 DIAGNOSIS — R1110 Vomiting, unspecified: Secondary | ICD-10-CM | POA: Diagnosis not present

## 2018-05-29 DIAGNOSIS — N186 End stage renal disease: Secondary | ICD-10-CM | POA: Diagnosis not present

## 2018-05-29 DIAGNOSIS — D509 Iron deficiency anemia, unspecified: Secondary | ICD-10-CM | POA: Diagnosis not present

## 2018-05-29 DIAGNOSIS — N2581 Secondary hyperparathyroidism of renal origin: Secondary | ICD-10-CM | POA: Diagnosis not present

## 2018-05-29 DIAGNOSIS — R110 Nausea: Secondary | ICD-10-CM | POA: Diagnosis not present

## 2018-05-29 DIAGNOSIS — Z992 Dependence on renal dialysis: Secondary | ICD-10-CM | POA: Diagnosis not present

## 2018-05-30 ENCOUNTER — Ambulatory Visit: Payer: Medicare Other | Admitting: Internal Medicine

## 2018-05-30 ENCOUNTER — Ambulatory Visit: Payer: Medicare Other | Admitting: Nurse Practitioner

## 2018-05-31 DIAGNOSIS — D509 Iron deficiency anemia, unspecified: Secondary | ICD-10-CM | POA: Diagnosis not present

## 2018-05-31 DIAGNOSIS — N186 End stage renal disease: Secondary | ICD-10-CM | POA: Diagnosis not present

## 2018-05-31 DIAGNOSIS — N2581 Secondary hyperparathyroidism of renal origin: Secondary | ICD-10-CM | POA: Diagnosis not present

## 2018-05-31 DIAGNOSIS — Z992 Dependence on renal dialysis: Secondary | ICD-10-CM | POA: Diagnosis not present

## 2018-05-31 DIAGNOSIS — R1110 Vomiting, unspecified: Secondary | ICD-10-CM | POA: Diagnosis not present

## 2018-05-31 DIAGNOSIS — R110 Nausea: Secondary | ICD-10-CM | POA: Diagnosis not present

## 2018-06-02 DIAGNOSIS — R1110 Vomiting, unspecified: Secondary | ICD-10-CM | POA: Diagnosis not present

## 2018-06-02 DIAGNOSIS — Z992 Dependence on renal dialysis: Secondary | ICD-10-CM | POA: Diagnosis not present

## 2018-06-02 DIAGNOSIS — N2581 Secondary hyperparathyroidism of renal origin: Secondary | ICD-10-CM | POA: Diagnosis not present

## 2018-06-02 DIAGNOSIS — D509 Iron deficiency anemia, unspecified: Secondary | ICD-10-CM | POA: Diagnosis not present

## 2018-06-02 DIAGNOSIS — R110 Nausea: Secondary | ICD-10-CM | POA: Diagnosis not present

## 2018-06-02 DIAGNOSIS — N186 End stage renal disease: Secondary | ICD-10-CM | POA: Diagnosis not present

## 2018-06-05 DIAGNOSIS — N2581 Secondary hyperparathyroidism of renal origin: Secondary | ICD-10-CM | POA: Diagnosis not present

## 2018-06-05 DIAGNOSIS — N186 End stage renal disease: Secondary | ICD-10-CM | POA: Diagnosis not present

## 2018-06-05 DIAGNOSIS — R110 Nausea: Secondary | ICD-10-CM | POA: Diagnosis not present

## 2018-06-05 DIAGNOSIS — Z992 Dependence on renal dialysis: Secondary | ICD-10-CM | POA: Diagnosis not present

## 2018-06-05 DIAGNOSIS — D509 Iron deficiency anemia, unspecified: Secondary | ICD-10-CM | POA: Diagnosis not present

## 2018-06-05 DIAGNOSIS — R1110 Vomiting, unspecified: Secondary | ICD-10-CM | POA: Diagnosis not present

## 2018-06-07 DIAGNOSIS — R1110 Vomiting, unspecified: Secondary | ICD-10-CM | POA: Diagnosis not present

## 2018-06-07 DIAGNOSIS — R110 Nausea: Secondary | ICD-10-CM | POA: Diagnosis not present

## 2018-06-07 DIAGNOSIS — N2581 Secondary hyperparathyroidism of renal origin: Secondary | ICD-10-CM | POA: Diagnosis not present

## 2018-06-07 DIAGNOSIS — D509 Iron deficiency anemia, unspecified: Secondary | ICD-10-CM | POA: Diagnosis not present

## 2018-06-07 DIAGNOSIS — Z992 Dependence on renal dialysis: Secondary | ICD-10-CM | POA: Diagnosis not present

## 2018-06-07 DIAGNOSIS — N186 End stage renal disease: Secondary | ICD-10-CM | POA: Diagnosis not present

## 2018-06-09 DIAGNOSIS — N186 End stage renal disease: Secondary | ICD-10-CM | POA: Diagnosis not present

## 2018-06-09 DIAGNOSIS — D509 Iron deficiency anemia, unspecified: Secondary | ICD-10-CM | POA: Diagnosis not present

## 2018-06-09 DIAGNOSIS — Z992 Dependence on renal dialysis: Secondary | ICD-10-CM | POA: Diagnosis not present

## 2018-06-09 DIAGNOSIS — R110 Nausea: Secondary | ICD-10-CM | POA: Diagnosis not present

## 2018-06-09 DIAGNOSIS — N2581 Secondary hyperparathyroidism of renal origin: Secondary | ICD-10-CM | POA: Diagnosis not present

## 2018-06-09 DIAGNOSIS — R1110 Vomiting, unspecified: Secondary | ICD-10-CM | POA: Diagnosis not present

## 2018-06-12 DIAGNOSIS — Z992 Dependence on renal dialysis: Secondary | ICD-10-CM | POA: Diagnosis not present

## 2018-06-12 DIAGNOSIS — R110 Nausea: Secondary | ICD-10-CM | POA: Diagnosis not present

## 2018-06-12 DIAGNOSIS — R1110 Vomiting, unspecified: Secondary | ICD-10-CM | POA: Diagnosis not present

## 2018-06-12 DIAGNOSIS — N2581 Secondary hyperparathyroidism of renal origin: Secondary | ICD-10-CM | POA: Diagnosis not present

## 2018-06-12 DIAGNOSIS — D509 Iron deficiency anemia, unspecified: Secondary | ICD-10-CM | POA: Diagnosis not present

## 2018-06-12 DIAGNOSIS — N186 End stage renal disease: Secondary | ICD-10-CM | POA: Diagnosis not present

## 2018-06-14 DIAGNOSIS — Z992 Dependence on renal dialysis: Secondary | ICD-10-CM | POA: Diagnosis not present

## 2018-06-14 DIAGNOSIS — Z23 Encounter for immunization: Secondary | ICD-10-CM | POA: Diagnosis not present

## 2018-06-14 DIAGNOSIS — D509 Iron deficiency anemia, unspecified: Secondary | ICD-10-CM | POA: Diagnosis not present

## 2018-06-14 DIAGNOSIS — N186 End stage renal disease: Secondary | ICD-10-CM | POA: Diagnosis not present

## 2018-06-14 DIAGNOSIS — N2581 Secondary hyperparathyroidism of renal origin: Secondary | ICD-10-CM | POA: Diagnosis not present

## 2018-06-14 DIAGNOSIS — D631 Anemia in chronic kidney disease: Secondary | ICD-10-CM | POA: Diagnosis not present

## 2018-06-16 DIAGNOSIS — Z992 Dependence on renal dialysis: Secondary | ICD-10-CM | POA: Diagnosis not present

## 2018-06-16 DIAGNOSIS — N186 End stage renal disease: Secondary | ICD-10-CM | POA: Diagnosis not present

## 2018-06-16 DIAGNOSIS — D509 Iron deficiency anemia, unspecified: Secondary | ICD-10-CM | POA: Diagnosis not present

## 2018-06-16 DIAGNOSIS — D631 Anemia in chronic kidney disease: Secondary | ICD-10-CM | POA: Diagnosis not present

## 2018-06-16 DIAGNOSIS — N2581 Secondary hyperparathyroidism of renal origin: Secondary | ICD-10-CM | POA: Diagnosis not present

## 2018-06-16 DIAGNOSIS — Z23 Encounter for immunization: Secondary | ICD-10-CM | POA: Diagnosis not present

## 2018-06-19 DIAGNOSIS — N2581 Secondary hyperparathyroidism of renal origin: Secondary | ICD-10-CM | POA: Diagnosis not present

## 2018-06-19 DIAGNOSIS — Z23 Encounter for immunization: Secondary | ICD-10-CM | POA: Diagnosis not present

## 2018-06-19 DIAGNOSIS — N186 End stage renal disease: Secondary | ICD-10-CM | POA: Diagnosis not present

## 2018-06-19 DIAGNOSIS — Z992 Dependence on renal dialysis: Secondary | ICD-10-CM | POA: Diagnosis not present

## 2018-06-19 DIAGNOSIS — D631 Anemia in chronic kidney disease: Secondary | ICD-10-CM | POA: Diagnosis not present

## 2018-06-19 DIAGNOSIS — D509 Iron deficiency anemia, unspecified: Secondary | ICD-10-CM | POA: Diagnosis not present

## 2018-06-21 ENCOUNTER — Other Ambulatory Visit: Payer: Self-pay | Admitting: Internal Medicine

## 2018-06-21 DIAGNOSIS — N2581 Secondary hyperparathyroidism of renal origin: Secondary | ICD-10-CM | POA: Diagnosis not present

## 2018-06-21 DIAGNOSIS — D509 Iron deficiency anemia, unspecified: Secondary | ICD-10-CM | POA: Diagnosis not present

## 2018-06-21 DIAGNOSIS — D631 Anemia in chronic kidney disease: Secondary | ICD-10-CM | POA: Diagnosis not present

## 2018-06-21 DIAGNOSIS — Z23 Encounter for immunization: Secondary | ICD-10-CM | POA: Diagnosis not present

## 2018-06-21 DIAGNOSIS — Z992 Dependence on renal dialysis: Secondary | ICD-10-CM | POA: Diagnosis not present

## 2018-06-21 DIAGNOSIS — N186 End stage renal disease: Secondary | ICD-10-CM | POA: Diagnosis not present

## 2018-06-23 DIAGNOSIS — Z992 Dependence on renal dialysis: Secondary | ICD-10-CM | POA: Diagnosis not present

## 2018-06-23 DIAGNOSIS — N186 End stage renal disease: Secondary | ICD-10-CM | POA: Diagnosis not present

## 2018-06-23 DIAGNOSIS — Z23 Encounter for immunization: Secondary | ICD-10-CM | POA: Diagnosis not present

## 2018-06-23 DIAGNOSIS — N2581 Secondary hyperparathyroidism of renal origin: Secondary | ICD-10-CM | POA: Diagnosis not present

## 2018-06-23 DIAGNOSIS — D631 Anemia in chronic kidney disease: Secondary | ICD-10-CM | POA: Diagnosis not present

## 2018-06-23 DIAGNOSIS — D509 Iron deficiency anemia, unspecified: Secondary | ICD-10-CM | POA: Diagnosis not present

## 2018-06-26 DIAGNOSIS — D509 Iron deficiency anemia, unspecified: Secondary | ICD-10-CM | POA: Diagnosis not present

## 2018-06-26 DIAGNOSIS — Z23 Encounter for immunization: Secondary | ICD-10-CM | POA: Diagnosis not present

## 2018-06-26 DIAGNOSIS — Z992 Dependence on renal dialysis: Secondary | ICD-10-CM | POA: Diagnosis not present

## 2018-06-26 DIAGNOSIS — D631 Anemia in chronic kidney disease: Secondary | ICD-10-CM | POA: Diagnosis not present

## 2018-06-26 DIAGNOSIS — N2581 Secondary hyperparathyroidism of renal origin: Secondary | ICD-10-CM | POA: Diagnosis not present

## 2018-06-26 DIAGNOSIS — N186 End stage renal disease: Secondary | ICD-10-CM | POA: Diagnosis not present

## 2018-06-28 DIAGNOSIS — Z992 Dependence on renal dialysis: Secondary | ICD-10-CM | POA: Diagnosis not present

## 2018-06-28 DIAGNOSIS — N2581 Secondary hyperparathyroidism of renal origin: Secondary | ICD-10-CM | POA: Diagnosis not present

## 2018-06-28 DIAGNOSIS — D509 Iron deficiency anemia, unspecified: Secondary | ICD-10-CM | POA: Diagnosis not present

## 2018-06-28 DIAGNOSIS — D631 Anemia in chronic kidney disease: Secondary | ICD-10-CM | POA: Diagnosis not present

## 2018-06-28 DIAGNOSIS — Z23 Encounter for immunization: Secondary | ICD-10-CM | POA: Diagnosis not present

## 2018-06-28 DIAGNOSIS — N186 End stage renal disease: Secondary | ICD-10-CM | POA: Diagnosis not present

## 2018-06-30 DIAGNOSIS — N2581 Secondary hyperparathyroidism of renal origin: Secondary | ICD-10-CM | POA: Diagnosis not present

## 2018-06-30 DIAGNOSIS — Z23 Encounter for immunization: Secondary | ICD-10-CM | POA: Diagnosis not present

## 2018-06-30 DIAGNOSIS — D631 Anemia in chronic kidney disease: Secondary | ICD-10-CM | POA: Diagnosis not present

## 2018-06-30 DIAGNOSIS — D509 Iron deficiency anemia, unspecified: Secondary | ICD-10-CM | POA: Diagnosis not present

## 2018-06-30 DIAGNOSIS — Z992 Dependence on renal dialysis: Secondary | ICD-10-CM | POA: Diagnosis not present

## 2018-06-30 DIAGNOSIS — N186 End stage renal disease: Secondary | ICD-10-CM | POA: Diagnosis not present

## 2018-07-03 DIAGNOSIS — N186 End stage renal disease: Secondary | ICD-10-CM | POA: Diagnosis not present

## 2018-07-03 DIAGNOSIS — D631 Anemia in chronic kidney disease: Secondary | ICD-10-CM | POA: Diagnosis not present

## 2018-07-03 DIAGNOSIS — Z23 Encounter for immunization: Secondary | ICD-10-CM | POA: Diagnosis not present

## 2018-07-03 DIAGNOSIS — D509 Iron deficiency anemia, unspecified: Secondary | ICD-10-CM | POA: Diagnosis not present

## 2018-07-03 DIAGNOSIS — N2581 Secondary hyperparathyroidism of renal origin: Secondary | ICD-10-CM | POA: Diagnosis not present

## 2018-07-03 DIAGNOSIS — Z992 Dependence on renal dialysis: Secondary | ICD-10-CM | POA: Diagnosis not present

## 2018-07-04 ENCOUNTER — Encounter (INDEPENDENT_AMBULATORY_CARE_PROVIDER_SITE_OTHER): Payer: Self-pay

## 2018-07-04 ENCOUNTER — Encounter (INDEPENDENT_AMBULATORY_CARE_PROVIDER_SITE_OTHER): Payer: Self-pay | Admitting: Vascular Surgery

## 2018-07-04 ENCOUNTER — Ambulatory Visit (INDEPENDENT_AMBULATORY_CARE_PROVIDER_SITE_OTHER): Payer: Medicare Other | Admitting: Vascular Surgery

## 2018-07-04 ENCOUNTER — Ambulatory Visit (INDEPENDENT_AMBULATORY_CARE_PROVIDER_SITE_OTHER): Payer: Medicare Other

## 2018-07-04 VITALS — BP 159/75 | HR 62 | Resp 17 | Ht 66.0 in | Wt 165.0 lb

## 2018-07-04 DIAGNOSIS — F17210 Nicotine dependence, cigarettes, uncomplicated: Secondary | ICD-10-CM | POA: Diagnosis not present

## 2018-07-04 DIAGNOSIS — N186 End stage renal disease: Secondary | ICD-10-CM

## 2018-07-04 DIAGNOSIS — E785 Hyperlipidemia, unspecified: Secondary | ICD-10-CM | POA: Diagnosis not present

## 2018-07-04 DIAGNOSIS — I120 Hypertensive chronic kidney disease with stage 5 chronic kidney disease or end stage renal disease: Secondary | ICD-10-CM | POA: Diagnosis not present

## 2018-07-04 DIAGNOSIS — Z992 Dependence on renal dialysis: Secondary | ICD-10-CM

## 2018-07-04 DIAGNOSIS — I10 Essential (primary) hypertension: Secondary | ICD-10-CM

## 2018-07-04 NOTE — Assessment & Plan Note (Signed)
His aneurysmal fistula currently has no skin threat.  No plan for intervention at this time as long as he is okay with the aneurysmal fistula and they are rotating where the stick him.  I will see him back in 6 months with a follow-up duplex.

## 2018-07-04 NOTE — Progress Notes (Signed)
MRN : [id number]  Daryl Weeks is a 57 y.o. ([birth year]) male who presents with chief complaint of  Chief Complaint  Patient presents with  . Follow-up    1 year HDA check  .  History of Present Illness: Patient returns today in follow up of an aneurysmal AVF.  He had an area of skin threat several months ago and it was recommended he have a surgical revision.  He says that he stops taking that area and the skin healed.  He continues to have a very large aneurysmal fistula but there is no current sign of bleeding or skin threat. He denies pain at this time.  Fistula is running well.    Current Outpatient Medications  Medication Sig Dispense Refill  . albuterol (VENTOLIN HFA) 108 (90 Base) MCG/ACT inhaler Inhale 2 puffs into the lungs every 6 (six) hours as needed for wheezing or shortness of breath.    [name] amLODipine (NORVASC) 10 MG tablet take 1 tablet by mouth once daily 30 tablet 5  . aspirin EC 81 MG tablet Take 1 tablet (81 mg total) by mouth daily. 90 tablet 3  . atorvastatin (LIPITOR) 20 MG tablet take 1 tablet by mouth at bedtime 30 tablet 6  . AURYXIA 1 GM 210 MG(Fe) tablet TK 1 T PO  TID BEFORE EACH MEAL  11  . budesonide (PULMICORT FLEXHALER) 180 MCG/ACT inhaler Inhale 1 puff into the lungs 2 (two) times daily. 1 each 11  . budesonide (PULMICORT) 0.5 MG/2ML nebulizer solution Take 2 mLs (0.5 mg total) by nebulization every 12 (twelve) hours. 60 mL 11  . calcium carbonate (TUMS - DOSED IN MG ELEMENTAL CALCIUM) 500 MG chewable tablet Chew 1 tablet by mouth daily.    . cinacalcet (SENSIPAR) 30 MG tablet Take by mouth.    . cloNIDine (CATAPRES) 0.1 MG tablet TK 1 T PO BID FOR HIGH BP  11  . furosemide (LASIX) 80 MG tablet Take 80 mg by mouth daily.    [name] gabapentin (NEURONTIN) 100 MG capsule TK 1 C PO TID  11  . irbesartan (AVAPRO) 150 MG tablet TK 1 T PO D AT NIGHT FOR HIGH BP  11  . irbesartan (AVAPRO) 300 MG tablet TK 1 T PO ATN FOR HIGH BP  0  . lidocaine-prilocaine (EMLA)  cream Apply 1 application topically as needed.    . metoprolol tartrate (LOPRESSOR) 100 MG tablet Take 1 tablet (100 mg total) by mouth 2 (two) times daily. 180 tablet 3  . multivitamin (RENA-VIT) TABS tablet TK 1 T PO D  11  . omeprazole (PRILOSEC) 20 MG capsule TK 1 C PO D  5  . ondansetron (ZOFRAN) 4 MG tablet TK 1 T PO BID PRF NAUSEA  1  . sevelamer carbonate (RENVELA) 800 MG tablet Take 800 mg by mouth 3 (three) times daily with meals.    . tiotropium (SPIRIVA) 18 MCG inhalation capsule Place 1 capsule (18 mcg total) into inhaler and inhale daily. 30 capsule 12  . zolpidem (AMBIEN) 10 MG tablet Take 10 mg by mouth at bedtime as needed for sleep.     Current Facility-Administered Medications  Medication Dose Route Frequency Provider Last Rate Last Dose  . ceFAZolin (ANCEF) IVPB 1 g/50 mL premix  1 g Intravenous Once [name], MD        Past Medical History:  Diagnosis Date  . Anemia   . Anxiety   . Arthritis   . CHF (congestive heart  failure) ([place])   . Colon polyps   . COPD (chronic obstructive pulmonary disease) ([place])   . Coronary artery disease    Non-ST elevation myocardial infarction in February of 2015. In the setting of hypertensive urgency and blood loss anemia. Cardiac catheterization showed significant three-vessel coronary artery disease. EF was 55% by echo. He underwent [facility] at [facility] on May 19 with LIMA to LAD, Sequential SVG to OM1 and OM2, and SVG to RCA  . End stage renal disease ([place])    m-w-f   -[place]  . End stage renal disease ([place])   . ETOH abuse   . GERD (gastroesophageal reflux disease)    hx  . Hyperlipidemia   . Hypertension   . IgA nephropathy   . Lower GI bleed    Due to colon polyps. Status post resection of 14 polyps  . Non-ST elevation MI (NSTEMI) ([place])   . Pneumonia    hx  . Rheumatoid arthritis ([place])   . Shortness of breath   . Sleep apnea   . Tobacco abuse   . Tobacco abuse     Past Surgical History:  Procedure Laterality  Date  . A/V FISTULAGRAM Left 10/28/2016   Procedure: A/V Fistulagram;  Surgeon: [name], MD;  Location: [facility];  Service: Cardiovascular;  Laterality: Left;  . A/V SHUNT INTERVENTION N/A 10/28/2016   Procedure: A/V Shunt Intervention;  Surgeon: [name], MD;  Location: [facility];  Service: Cardiovascular;  Laterality: N/A;  . AV FISTULA PLACEMENT    . CARDIAC CATHETERIZATION     RCA 90% and calcified mid LAD 80% Stenosis  . CORONARY ARTERY BYPASS GRAFT N/A 11/29/2013   Procedure: CORONARY ARTERY BYPASS GRAFTING (CABG) x 4 using endoscopically harvested right saphenous vein and left internal mammary artery;  Surgeon: [name], MD;  Location: [facility];  Service: Open Heart Surgery;  Laterality: N/A;  . dialysis catheterr  2/15  . INTRAOPERATIVE TRANSESOPHAGEAL ECHOCARDIOGRAM N/A 11/29/2013   Procedure: INTRAOPERATIVE TRANSESOPHAGEAL ECHOCARDIOGRAM;  Surgeon: [name], MD;  Location: [facility] OR;  Service: Open Heart Surgery;  Laterality: N/A;  . PERIPHERAL VASCULAR CATHETERIZATION N/A 06/12/2015   Procedure: A/V Shuntogram/Fistulagram;  Surgeon: [name], MD;  Location: [facility];  Service: Cardiovascular;  Laterality: N/A;  . PERIPHERAL VASCULAR CATHETERIZATION Left 06/12/2015   Procedure: A/V Shunt Intervention;  Surgeon: [name], MD;  Location: [facility];  Service: Cardiovascular;  Laterality: Left;  . RENAL BIOPSY Left 14    Social History  Substance Use Topics  . Smoking status: Current Every Day Smoker    Packs/day: 0.50    Years: 30.00    Types: Cigarettes  . Smokeless tobacco: Never Used  . Alcohol use 1.2 oz/week     2 Cans of beer per week      Comment: less now    Family History      Family History  Problem Relation Age of Onset  . Heart disease Father   . Heart disease Brother     No Known Allergies   REVIEW OF SYSTEMS(Negative unless checked)  Constitutional: [] Weight  loss[] Fever[] Chills Cardiac:[] Chest pain[] Chest pressure[] Palpitations [] Shortness of breath when laying flat [] Shortness of breath at rest [] Shortness of breath with exertion. Vascular: [] Pain in legs with walking[] Pain in legsat rest[] Pain in legs when laying flat [] Claudication [] Pain in feet when walking [] Pain in feet at rest [] Pain in feet when laying flat [] History of DVT [] Phlebitis [] Swelling in  legs [] Varicose veins [] Non-healing ulcers Pulmonary: [] Uses home oxygen [] Productive cough[] Hemoptysis [] Wheeze [x] COPD [] Asthma Neurologic: [] Dizziness [] Blackouts [] Seizures [] History of stroke [] History of TIA[] Aphasia [] Temporary blindness[] Dysphagia [] Weaknessor numbness in arms [] Weakness or numbnessin legs Musculoskeletal: [x] Arthritis [] Joint swelling [] Joint pain [] Low back pain Hematologic:[] Easy bruising[] Easy bleeding [] Hypercoagulable state [x] Anemic  Gastrointestinal:[] Blood in stool[] Vomiting blood[] Gastroesophageal reflux/heartburn[] Abdominal pain Genitourinary: [x] Chronic kidney disease [] Difficulturination [] Frequenturination [] Burning with urination[] Hematuria Skin: [] Rashes [] Ulcers [] Wounds Psychological: [] History of anxiety[] History of major depression.    Physical Examination  BP (!) 159/75 (BP Location: Right Arm, Patient Position: Sitting)   Pulse 62   Resp 17   Ht 5\' 6"  (1.676 m)   Wt 165 lb (74.8 kg)   BMI 26.63 kg/m  Gen:  WD/WN, NAD Head: [place]/AT, No temporalis wasting. Ear/Nose/Throat: Hearing grossly intact, nares w/o erythema or drainage Eyes: Conjunctiva clear. Sclera non-icteric Neck: Supple.  Trachea midline Pulmonary:  Good air movement, no use of accessory muscles.  Cardiac: RRR, no JVD Vascular: good thrill in the aneurysmal left forearm fistula. Vessel Right Left  Radial Palpable Palpable                                     Musculoskeletal: M/S 5/5 throughout.  No deformity or atrophy. No edema. Neurologic: Sensation grossly intact in extremities.  Symmetrical.  Speech is fluent.  Psychiatric: Judgment intact, Mood & affect appropriate for pt's clinical situation. Dermatologic: No rashes or ulcers noted.  No cellulitis or open wounds.       Labs No results found for this or any previous visit (from the past 2160 hour(s)).  Radiology No results found.  Assessment/Plan Hypertension blood pressure control important in reducing the progression of atherosclerotic disease. On appropriate oral medications.   Hyperlipidemia lipid control important in reducing the progression of atherosclerotic disease. Continue statin therapy  ESRD [facility] His aneurysmal fistula currently has no skin threat.  No plan for intervention at this time as long as he is okay with the aneurysmal fistula and they are rotating where the stick him.  I will see him back in 6 months with a follow-up duplex.    Leotis Pain, MD  07/04/2018 10:33 AM    This note was created with Dragon medical transcription system.  Any errors from dictation are purely unintentional

## 2018-07-05 DIAGNOSIS — Z992 Dependence on renal dialysis: Secondary | ICD-10-CM | POA: Diagnosis not present

## 2018-07-05 DIAGNOSIS — N186 End stage renal disease: Secondary | ICD-10-CM | POA: Diagnosis not present

## 2018-07-05 DIAGNOSIS — D509 Iron deficiency anemia, unspecified: Secondary | ICD-10-CM | POA: Diagnosis not present

## 2018-07-05 DIAGNOSIS — Z23 Encounter for immunization: Secondary | ICD-10-CM | POA: Diagnosis not present

## 2018-07-05 DIAGNOSIS — D631 Anemia in chronic kidney disease: Secondary | ICD-10-CM | POA: Diagnosis not present

## 2018-07-05 DIAGNOSIS — N2581 Secondary hyperparathyroidism of renal origin: Secondary | ICD-10-CM | POA: Diagnosis not present

## 2018-07-07 DIAGNOSIS — N2581 Secondary hyperparathyroidism of renal origin: Secondary | ICD-10-CM | POA: Diagnosis not present

## 2018-07-07 DIAGNOSIS — Z992 Dependence on renal dialysis: Secondary | ICD-10-CM | POA: Diagnosis not present

## 2018-07-07 DIAGNOSIS — N186 End stage renal disease: Secondary | ICD-10-CM | POA: Diagnosis not present

## 2018-07-07 DIAGNOSIS — D631 Anemia in chronic kidney disease: Secondary | ICD-10-CM | POA: Diagnosis not present

## 2018-07-07 DIAGNOSIS — Z23 Encounter for immunization: Secondary | ICD-10-CM | POA: Diagnosis not present

## 2018-07-07 DIAGNOSIS — D509 Iron deficiency anemia, unspecified: Secondary | ICD-10-CM | POA: Diagnosis not present

## 2018-07-10 DIAGNOSIS — Z992 Dependence on renal dialysis: Secondary | ICD-10-CM | POA: Diagnosis not present

## 2018-07-10 DIAGNOSIS — D509 Iron deficiency anemia, unspecified: Secondary | ICD-10-CM | POA: Diagnosis not present

## 2018-07-10 DIAGNOSIS — D631 Anemia in chronic kidney disease: Secondary | ICD-10-CM | POA: Diagnosis not present

## 2018-07-10 DIAGNOSIS — Z23 Encounter for immunization: Secondary | ICD-10-CM | POA: Diagnosis not present

## 2018-07-10 DIAGNOSIS — N186 End stage renal disease: Secondary | ICD-10-CM | POA: Diagnosis not present

## 2018-07-10 DIAGNOSIS — N2581 Secondary hyperparathyroidism of renal origin: Secondary | ICD-10-CM | POA: Diagnosis not present

## 2018-07-12 DIAGNOSIS — N186 End stage renal disease: Secondary | ICD-10-CM | POA: Diagnosis not present

## 2018-07-12 DIAGNOSIS — Z992 Dependence on renal dialysis: Secondary | ICD-10-CM | POA: Diagnosis not present

## 2018-07-12 DIAGNOSIS — D509 Iron deficiency anemia, unspecified: Secondary | ICD-10-CM | POA: Diagnosis not present

## 2018-07-12 DIAGNOSIS — Z23 Encounter for immunization: Secondary | ICD-10-CM | POA: Diagnosis not present

## 2018-07-12 DIAGNOSIS — N2581 Secondary hyperparathyroidism of renal origin: Secondary | ICD-10-CM | POA: Diagnosis not present

## 2018-07-12 DIAGNOSIS — D631 Anemia in chronic kidney disease: Secondary | ICD-10-CM | POA: Diagnosis not present

## 2018-07-13 DIAGNOSIS — N186 End stage renal disease: Secondary | ICD-10-CM | POA: Diagnosis not present

## 2018-07-13 DIAGNOSIS — Z992 Dependence on renal dialysis: Secondary | ICD-10-CM | POA: Diagnosis not present

## 2018-07-14 DIAGNOSIS — D509 Iron deficiency anemia, unspecified: Secondary | ICD-10-CM | POA: Diagnosis not present

## 2018-07-14 DIAGNOSIS — Z992 Dependence on renal dialysis: Secondary | ICD-10-CM | POA: Diagnosis not present

## 2018-07-14 DIAGNOSIS — D631 Anemia in chronic kidney disease: Secondary | ICD-10-CM | POA: Diagnosis not present

## 2018-07-14 DIAGNOSIS — N2581 Secondary hyperparathyroidism of renal origin: Secondary | ICD-10-CM | POA: Diagnosis not present

## 2018-07-14 DIAGNOSIS — N186 End stage renal disease: Secondary | ICD-10-CM | POA: Diagnosis not present

## 2018-07-17 DIAGNOSIS — N186 End stage renal disease: Secondary | ICD-10-CM | POA: Diagnosis not present

## 2018-07-17 DIAGNOSIS — D631 Anemia in chronic kidney disease: Secondary | ICD-10-CM | POA: Diagnosis not present

## 2018-07-17 DIAGNOSIS — D509 Iron deficiency anemia, unspecified: Secondary | ICD-10-CM | POA: Diagnosis not present

## 2018-07-17 DIAGNOSIS — N2581 Secondary hyperparathyroidism of renal origin: Secondary | ICD-10-CM | POA: Diagnosis not present

## 2018-07-17 DIAGNOSIS — Z992 Dependence on renal dialysis: Secondary | ICD-10-CM | POA: Diagnosis not present

## 2018-07-19 DIAGNOSIS — D509 Iron deficiency anemia, unspecified: Secondary | ICD-10-CM | POA: Diagnosis not present

## 2018-07-19 DIAGNOSIS — D631 Anemia in chronic kidney disease: Secondary | ICD-10-CM | POA: Diagnosis not present

## 2018-07-19 DIAGNOSIS — N2581 Secondary hyperparathyroidism of renal origin: Secondary | ICD-10-CM | POA: Diagnosis not present

## 2018-07-19 DIAGNOSIS — Z992 Dependence on renal dialysis: Secondary | ICD-10-CM | POA: Diagnosis not present

## 2018-07-19 DIAGNOSIS — N186 End stage renal disease: Secondary | ICD-10-CM | POA: Diagnosis not present

## 2018-07-21 DIAGNOSIS — Z992 Dependence on renal dialysis: Secondary | ICD-10-CM | POA: Diagnosis not present

## 2018-07-21 DIAGNOSIS — N2581 Secondary hyperparathyroidism of renal origin: Secondary | ICD-10-CM | POA: Diagnosis not present

## 2018-07-21 DIAGNOSIS — N186 End stage renal disease: Secondary | ICD-10-CM | POA: Diagnosis not present

## 2018-07-21 DIAGNOSIS — D631 Anemia in chronic kidney disease: Secondary | ICD-10-CM | POA: Diagnosis not present

## 2018-07-21 DIAGNOSIS — D509 Iron deficiency anemia, unspecified: Secondary | ICD-10-CM | POA: Diagnosis not present

## 2018-07-24 DIAGNOSIS — N186 End stage renal disease: Secondary | ICD-10-CM | POA: Diagnosis not present

## 2018-07-24 DIAGNOSIS — D631 Anemia in chronic kidney disease: Secondary | ICD-10-CM | POA: Diagnosis not present

## 2018-07-24 DIAGNOSIS — Z992 Dependence on renal dialysis: Secondary | ICD-10-CM | POA: Diagnosis not present

## 2018-07-24 DIAGNOSIS — N2581 Secondary hyperparathyroidism of renal origin: Secondary | ICD-10-CM | POA: Diagnosis not present

## 2018-07-24 DIAGNOSIS — D509 Iron deficiency anemia, unspecified: Secondary | ICD-10-CM | POA: Diagnosis not present

## 2018-07-26 DIAGNOSIS — N2581 Secondary hyperparathyroidism of renal origin: Secondary | ICD-10-CM | POA: Diagnosis not present

## 2018-07-26 DIAGNOSIS — D631 Anemia in chronic kidney disease: Secondary | ICD-10-CM | POA: Diagnosis not present

## 2018-07-26 DIAGNOSIS — D509 Iron deficiency anemia, unspecified: Secondary | ICD-10-CM | POA: Diagnosis not present

## 2018-07-26 DIAGNOSIS — Z992 Dependence on renal dialysis: Secondary | ICD-10-CM | POA: Diagnosis not present

## 2018-07-26 DIAGNOSIS — N186 End stage renal disease: Secondary | ICD-10-CM | POA: Diagnosis not present

## 2018-07-28 DIAGNOSIS — Z992 Dependence on renal dialysis: Secondary | ICD-10-CM | POA: Diagnosis not present

## 2018-07-28 DIAGNOSIS — D631 Anemia in chronic kidney disease: Secondary | ICD-10-CM | POA: Diagnosis not present

## 2018-07-28 DIAGNOSIS — N186 End stage renal disease: Secondary | ICD-10-CM | POA: Diagnosis not present

## 2018-07-28 DIAGNOSIS — N2581 Secondary hyperparathyroidism of renal origin: Secondary | ICD-10-CM | POA: Diagnosis not present

## 2018-07-28 DIAGNOSIS — D509 Iron deficiency anemia, unspecified: Secondary | ICD-10-CM | POA: Diagnosis not present

## 2018-07-31 DIAGNOSIS — Z992 Dependence on renal dialysis: Secondary | ICD-10-CM | POA: Diagnosis not present

## 2018-07-31 DIAGNOSIS — D509 Iron deficiency anemia, unspecified: Secondary | ICD-10-CM | POA: Diagnosis not present

## 2018-07-31 DIAGNOSIS — N2581 Secondary hyperparathyroidism of renal origin: Secondary | ICD-10-CM | POA: Diagnosis not present

## 2018-07-31 DIAGNOSIS — N186 End stage renal disease: Secondary | ICD-10-CM | POA: Diagnosis not present

## 2018-07-31 DIAGNOSIS — D631 Anemia in chronic kidney disease: Secondary | ICD-10-CM | POA: Diagnosis not present

## 2018-08-02 DIAGNOSIS — N186 End stage renal disease: Secondary | ICD-10-CM | POA: Diagnosis not present

## 2018-08-02 DIAGNOSIS — N2581 Secondary hyperparathyroidism of renal origin: Secondary | ICD-10-CM | POA: Diagnosis not present

## 2018-08-02 DIAGNOSIS — D509 Iron deficiency anemia, unspecified: Secondary | ICD-10-CM | POA: Diagnosis not present

## 2018-08-02 DIAGNOSIS — D631 Anemia in chronic kidney disease: Secondary | ICD-10-CM | POA: Diagnosis not present

## 2018-08-02 DIAGNOSIS — Z992 Dependence on renal dialysis: Secondary | ICD-10-CM | POA: Diagnosis not present

## 2018-08-04 DIAGNOSIS — N186 End stage renal disease: Secondary | ICD-10-CM | POA: Diagnosis not present

## 2018-08-04 DIAGNOSIS — N2581 Secondary hyperparathyroidism of renal origin: Secondary | ICD-10-CM | POA: Diagnosis not present

## 2018-08-04 DIAGNOSIS — D631 Anemia in chronic kidney disease: Secondary | ICD-10-CM | POA: Diagnosis not present

## 2018-08-04 DIAGNOSIS — D509 Iron deficiency anemia, unspecified: Secondary | ICD-10-CM | POA: Diagnosis not present

## 2018-08-04 DIAGNOSIS — Z992 Dependence on renal dialysis: Secondary | ICD-10-CM | POA: Diagnosis not present

## 2018-08-07 DIAGNOSIS — N186 End stage renal disease: Secondary | ICD-10-CM | POA: Diagnosis not present

## 2018-08-07 DIAGNOSIS — N2581 Secondary hyperparathyroidism of renal origin: Secondary | ICD-10-CM | POA: Diagnosis not present

## 2018-08-07 DIAGNOSIS — D631 Anemia in chronic kidney disease: Secondary | ICD-10-CM | POA: Diagnosis not present

## 2018-08-07 DIAGNOSIS — D509 Iron deficiency anemia, unspecified: Secondary | ICD-10-CM | POA: Diagnosis not present

## 2018-08-07 DIAGNOSIS — Z992 Dependence on renal dialysis: Secondary | ICD-10-CM | POA: Diagnosis not present

## 2018-08-09 DIAGNOSIS — Z992 Dependence on renal dialysis: Secondary | ICD-10-CM | POA: Diagnosis not present

## 2018-08-09 DIAGNOSIS — N2581 Secondary hyperparathyroidism of renal origin: Secondary | ICD-10-CM | POA: Diagnosis not present

## 2018-08-09 DIAGNOSIS — D631 Anemia in chronic kidney disease: Secondary | ICD-10-CM | POA: Diagnosis not present

## 2018-08-09 DIAGNOSIS — N186 End stage renal disease: Secondary | ICD-10-CM | POA: Diagnosis not present

## 2018-08-09 DIAGNOSIS — D509 Iron deficiency anemia, unspecified: Secondary | ICD-10-CM | POA: Diagnosis not present

## 2018-08-11 DIAGNOSIS — D509 Iron deficiency anemia, unspecified: Secondary | ICD-10-CM | POA: Diagnosis not present

## 2018-08-11 DIAGNOSIS — N2581 Secondary hyperparathyroidism of renal origin: Secondary | ICD-10-CM | POA: Diagnosis not present

## 2018-08-11 DIAGNOSIS — N186 End stage renal disease: Secondary | ICD-10-CM | POA: Diagnosis not present

## 2018-08-11 DIAGNOSIS — D631 Anemia in chronic kidney disease: Secondary | ICD-10-CM | POA: Diagnosis not present

## 2018-08-11 DIAGNOSIS — Z992 Dependence on renal dialysis: Secondary | ICD-10-CM | POA: Diagnosis not present

## 2018-08-12 DIAGNOSIS — N186 End stage renal disease: Secondary | ICD-10-CM | POA: Diagnosis not present

## 2018-08-12 DIAGNOSIS — Z992 Dependence on renal dialysis: Secondary | ICD-10-CM | POA: Diagnosis not present

## 2018-08-14 DIAGNOSIS — R1110 Vomiting, unspecified: Secondary | ICD-10-CM | POA: Diagnosis not present

## 2018-08-14 DIAGNOSIS — D631 Anemia in chronic kidney disease: Secondary | ICD-10-CM | POA: Diagnosis not present

## 2018-08-14 DIAGNOSIS — Z992 Dependence on renal dialysis: Secondary | ICD-10-CM | POA: Diagnosis not present

## 2018-08-14 DIAGNOSIS — N2581 Secondary hyperparathyroidism of renal origin: Secondary | ICD-10-CM | POA: Diagnosis not present

## 2018-08-14 DIAGNOSIS — N186 End stage renal disease: Secondary | ICD-10-CM | POA: Diagnosis not present

## 2018-08-14 DIAGNOSIS — R110 Nausea: Secondary | ICD-10-CM | POA: Diagnosis not present

## 2018-08-14 DIAGNOSIS — D509 Iron deficiency anemia, unspecified: Secondary | ICD-10-CM | POA: Diagnosis not present

## 2018-08-16 DIAGNOSIS — R1110 Vomiting, unspecified: Secondary | ICD-10-CM | POA: Diagnosis not present

## 2018-08-16 DIAGNOSIS — R110 Nausea: Secondary | ICD-10-CM | POA: Diagnosis not present

## 2018-08-16 DIAGNOSIS — N2581 Secondary hyperparathyroidism of renal origin: Secondary | ICD-10-CM | POA: Diagnosis not present

## 2018-08-16 DIAGNOSIS — D509 Iron deficiency anemia, unspecified: Secondary | ICD-10-CM | POA: Diagnosis not present

## 2018-08-16 DIAGNOSIS — Z992 Dependence on renal dialysis: Secondary | ICD-10-CM | POA: Diagnosis not present

## 2018-08-16 DIAGNOSIS — N186 End stage renal disease: Secondary | ICD-10-CM | POA: Diagnosis not present

## 2018-08-18 DIAGNOSIS — R1110 Vomiting, unspecified: Secondary | ICD-10-CM | POA: Diagnosis not present

## 2018-08-18 DIAGNOSIS — N2581 Secondary hyperparathyroidism of renal origin: Secondary | ICD-10-CM | POA: Diagnosis not present

## 2018-08-18 DIAGNOSIS — R110 Nausea: Secondary | ICD-10-CM | POA: Diagnosis not present

## 2018-08-18 DIAGNOSIS — D509 Iron deficiency anemia, unspecified: Secondary | ICD-10-CM | POA: Diagnosis not present

## 2018-08-18 DIAGNOSIS — N186 End stage renal disease: Secondary | ICD-10-CM | POA: Diagnosis not present

## 2018-08-18 DIAGNOSIS — Z992 Dependence on renal dialysis: Secondary | ICD-10-CM | POA: Diagnosis not present

## 2018-08-21 DIAGNOSIS — D509 Iron deficiency anemia, unspecified: Secondary | ICD-10-CM | POA: Diagnosis not present

## 2018-08-21 DIAGNOSIS — N2581 Secondary hyperparathyroidism of renal origin: Secondary | ICD-10-CM | POA: Diagnosis not present

## 2018-08-21 DIAGNOSIS — N186 End stage renal disease: Secondary | ICD-10-CM | POA: Diagnosis not present

## 2018-08-21 DIAGNOSIS — Z992 Dependence on renal dialysis: Secondary | ICD-10-CM | POA: Diagnosis not present

## 2018-08-21 DIAGNOSIS — R110 Nausea: Secondary | ICD-10-CM | POA: Diagnosis not present

## 2018-08-21 DIAGNOSIS — R1110 Vomiting, unspecified: Secondary | ICD-10-CM | POA: Diagnosis not present

## 2018-08-23 DIAGNOSIS — R1110 Vomiting, unspecified: Secondary | ICD-10-CM | POA: Diagnosis not present

## 2018-08-23 DIAGNOSIS — Z992 Dependence on renal dialysis: Secondary | ICD-10-CM | POA: Diagnosis not present

## 2018-08-23 DIAGNOSIS — N2581 Secondary hyperparathyroidism of renal origin: Secondary | ICD-10-CM | POA: Diagnosis not present

## 2018-08-23 DIAGNOSIS — R110 Nausea: Secondary | ICD-10-CM | POA: Diagnosis not present

## 2018-08-23 DIAGNOSIS — N186 End stage renal disease: Secondary | ICD-10-CM | POA: Diagnosis not present

## 2018-08-23 DIAGNOSIS — D509 Iron deficiency anemia, unspecified: Secondary | ICD-10-CM | POA: Diagnosis not present

## 2018-08-25 DIAGNOSIS — N2581 Secondary hyperparathyroidism of renal origin: Secondary | ICD-10-CM | POA: Diagnosis not present

## 2018-08-25 DIAGNOSIS — D509 Iron deficiency anemia, unspecified: Secondary | ICD-10-CM | POA: Diagnosis not present

## 2018-08-25 DIAGNOSIS — R1110 Vomiting, unspecified: Secondary | ICD-10-CM | POA: Diagnosis not present

## 2018-08-25 DIAGNOSIS — N186 End stage renal disease: Secondary | ICD-10-CM | POA: Diagnosis not present

## 2018-08-25 DIAGNOSIS — Z992 Dependence on renal dialysis: Secondary | ICD-10-CM | POA: Diagnosis not present

## 2018-08-25 DIAGNOSIS — R110 Nausea: Secondary | ICD-10-CM | POA: Diagnosis not present

## 2018-08-28 DIAGNOSIS — N186 End stage renal disease: Secondary | ICD-10-CM | POA: Diagnosis not present

## 2018-08-28 DIAGNOSIS — D509 Iron deficiency anemia, unspecified: Secondary | ICD-10-CM | POA: Diagnosis not present

## 2018-08-28 DIAGNOSIS — R110 Nausea: Secondary | ICD-10-CM | POA: Diagnosis not present

## 2018-08-28 DIAGNOSIS — Z992 Dependence on renal dialysis: Secondary | ICD-10-CM | POA: Diagnosis not present

## 2018-08-28 DIAGNOSIS — N2581 Secondary hyperparathyroidism of renal origin: Secondary | ICD-10-CM | POA: Diagnosis not present

## 2018-08-28 DIAGNOSIS — R1110 Vomiting, unspecified: Secondary | ICD-10-CM | POA: Diagnosis not present

## 2018-08-30 DIAGNOSIS — D509 Iron deficiency anemia, unspecified: Secondary | ICD-10-CM | POA: Diagnosis not present

## 2018-08-30 DIAGNOSIS — N186 End stage renal disease: Secondary | ICD-10-CM | POA: Diagnosis not present

## 2018-08-30 DIAGNOSIS — R1110 Vomiting, unspecified: Secondary | ICD-10-CM | POA: Diagnosis not present

## 2018-08-30 DIAGNOSIS — N2581 Secondary hyperparathyroidism of renal origin: Secondary | ICD-10-CM | POA: Diagnosis not present

## 2018-08-30 DIAGNOSIS — R110 Nausea: Secondary | ICD-10-CM | POA: Diagnosis not present

## 2018-08-30 DIAGNOSIS — Z992 Dependence on renal dialysis: Secondary | ICD-10-CM | POA: Diagnosis not present

## 2018-09-01 DIAGNOSIS — D509 Iron deficiency anemia, unspecified: Secondary | ICD-10-CM | POA: Diagnosis not present

## 2018-09-01 DIAGNOSIS — R110 Nausea: Secondary | ICD-10-CM | POA: Diagnosis not present

## 2018-09-01 DIAGNOSIS — Z992 Dependence on renal dialysis: Secondary | ICD-10-CM | POA: Diagnosis not present

## 2018-09-01 DIAGNOSIS — N186 End stage renal disease: Secondary | ICD-10-CM | POA: Diagnosis not present

## 2018-09-01 DIAGNOSIS — R1110 Vomiting, unspecified: Secondary | ICD-10-CM | POA: Diagnosis not present

## 2018-09-01 DIAGNOSIS — N2581 Secondary hyperparathyroidism of renal origin: Secondary | ICD-10-CM | POA: Diagnosis not present

## 2018-09-04 DIAGNOSIS — Z992 Dependence on renal dialysis: Secondary | ICD-10-CM | POA: Diagnosis not present

## 2018-09-04 DIAGNOSIS — N186 End stage renal disease: Secondary | ICD-10-CM | POA: Diagnosis not present

## 2018-09-04 DIAGNOSIS — R1110 Vomiting, unspecified: Secondary | ICD-10-CM | POA: Diagnosis not present

## 2018-09-04 DIAGNOSIS — N2581 Secondary hyperparathyroidism of renal origin: Secondary | ICD-10-CM | POA: Diagnosis not present

## 2018-09-04 DIAGNOSIS — D509 Iron deficiency anemia, unspecified: Secondary | ICD-10-CM | POA: Diagnosis not present

## 2018-09-04 DIAGNOSIS — R110 Nausea: Secondary | ICD-10-CM | POA: Diagnosis not present

## 2018-09-07 DIAGNOSIS — Z992 Dependence on renal dialysis: Secondary | ICD-10-CM | POA: Diagnosis not present

## 2018-09-07 DIAGNOSIS — R1110 Vomiting, unspecified: Secondary | ICD-10-CM | POA: Diagnosis not present

## 2018-09-07 DIAGNOSIS — N186 End stage renal disease: Secondary | ICD-10-CM | POA: Diagnosis not present

## 2018-09-07 DIAGNOSIS — N2581 Secondary hyperparathyroidism of renal origin: Secondary | ICD-10-CM | POA: Diagnosis not present

## 2018-09-07 DIAGNOSIS — R110 Nausea: Secondary | ICD-10-CM | POA: Diagnosis not present

## 2018-09-07 DIAGNOSIS — D509 Iron deficiency anemia, unspecified: Secondary | ICD-10-CM | POA: Diagnosis not present

## 2018-09-08 DIAGNOSIS — N2581 Secondary hyperparathyroidism of renal origin: Secondary | ICD-10-CM | POA: Diagnosis not present

## 2018-09-08 DIAGNOSIS — D509 Iron deficiency anemia, unspecified: Secondary | ICD-10-CM | POA: Diagnosis not present

## 2018-09-08 DIAGNOSIS — Z992 Dependence on renal dialysis: Secondary | ICD-10-CM | POA: Diagnosis not present

## 2018-09-08 DIAGNOSIS — R110 Nausea: Secondary | ICD-10-CM | POA: Diagnosis not present

## 2018-09-08 DIAGNOSIS — N186 End stage renal disease: Secondary | ICD-10-CM | POA: Diagnosis not present

## 2018-09-08 DIAGNOSIS — R1110 Vomiting, unspecified: Secondary | ICD-10-CM | POA: Diagnosis not present

## 2018-09-11 DIAGNOSIS — Z992 Dependence on renal dialysis: Secondary | ICD-10-CM | POA: Diagnosis not present

## 2018-09-11 DIAGNOSIS — N186 End stage renal disease: Secondary | ICD-10-CM | POA: Diagnosis not present

## 2018-09-11 DIAGNOSIS — D509 Iron deficiency anemia, unspecified: Secondary | ICD-10-CM | POA: Diagnosis not present

## 2018-09-11 DIAGNOSIS — R1110 Vomiting, unspecified: Secondary | ICD-10-CM | POA: Diagnosis not present

## 2018-09-11 DIAGNOSIS — N2581 Secondary hyperparathyroidism of renal origin: Secondary | ICD-10-CM | POA: Diagnosis not present

## 2018-09-11 DIAGNOSIS — R110 Nausea: Secondary | ICD-10-CM | POA: Diagnosis not present

## 2018-09-12 DIAGNOSIS — Z992 Dependence on renal dialysis: Secondary | ICD-10-CM | POA: Diagnosis not present

## 2018-09-12 DIAGNOSIS — N186 End stage renal disease: Secondary | ICD-10-CM | POA: Diagnosis not present

## 2018-09-13 DIAGNOSIS — N2581 Secondary hyperparathyroidism of renal origin: Secondary | ICD-10-CM | POA: Diagnosis not present

## 2018-09-13 DIAGNOSIS — Z992 Dependence on renal dialysis: Secondary | ICD-10-CM | POA: Diagnosis not present

## 2018-09-13 DIAGNOSIS — D631 Anemia in chronic kidney disease: Secondary | ICD-10-CM | POA: Diagnosis not present

## 2018-09-13 DIAGNOSIS — N186 End stage renal disease: Secondary | ICD-10-CM | POA: Diagnosis not present

## 2018-09-13 DIAGNOSIS — Z23 Encounter for immunization: Secondary | ICD-10-CM | POA: Diagnosis not present

## 2018-09-13 DIAGNOSIS — D509 Iron deficiency anemia, unspecified: Secondary | ICD-10-CM | POA: Diagnosis not present

## 2018-09-15 DIAGNOSIS — D509 Iron deficiency anemia, unspecified: Secondary | ICD-10-CM | POA: Diagnosis not present

## 2018-09-15 DIAGNOSIS — Z992 Dependence on renal dialysis: Secondary | ICD-10-CM | POA: Diagnosis not present

## 2018-09-15 DIAGNOSIS — N186 End stage renal disease: Secondary | ICD-10-CM | POA: Diagnosis not present

## 2018-09-15 DIAGNOSIS — Z23 Encounter for immunization: Secondary | ICD-10-CM | POA: Diagnosis not present

## 2018-09-15 DIAGNOSIS — D631 Anemia in chronic kidney disease: Secondary | ICD-10-CM | POA: Diagnosis not present

## 2018-09-15 DIAGNOSIS — N2581 Secondary hyperparathyroidism of renal origin: Secondary | ICD-10-CM | POA: Diagnosis not present

## 2018-09-18 DIAGNOSIS — Z23 Encounter for immunization: Secondary | ICD-10-CM | POA: Diagnosis not present

## 2018-09-18 DIAGNOSIS — N186 End stage renal disease: Secondary | ICD-10-CM | POA: Diagnosis not present

## 2018-09-18 DIAGNOSIS — D631 Anemia in chronic kidney disease: Secondary | ICD-10-CM | POA: Diagnosis not present

## 2018-09-18 DIAGNOSIS — N2581 Secondary hyperparathyroidism of renal origin: Secondary | ICD-10-CM | POA: Diagnosis not present

## 2018-09-18 DIAGNOSIS — Z992 Dependence on renal dialysis: Secondary | ICD-10-CM | POA: Diagnosis not present

## 2018-09-18 DIAGNOSIS — D509 Iron deficiency anemia, unspecified: Secondary | ICD-10-CM | POA: Diagnosis not present

## 2018-09-20 DIAGNOSIS — Z23 Encounter for immunization: Secondary | ICD-10-CM | POA: Diagnosis not present

## 2018-09-20 DIAGNOSIS — D509 Iron deficiency anemia, unspecified: Secondary | ICD-10-CM | POA: Diagnosis not present

## 2018-09-20 DIAGNOSIS — N186 End stage renal disease: Secondary | ICD-10-CM | POA: Diagnosis not present

## 2018-09-20 DIAGNOSIS — N2581 Secondary hyperparathyroidism of renal origin: Secondary | ICD-10-CM | POA: Diagnosis not present

## 2018-09-20 DIAGNOSIS — Z992 Dependence on renal dialysis: Secondary | ICD-10-CM | POA: Diagnosis not present

## 2018-09-20 DIAGNOSIS — D631 Anemia in chronic kidney disease: Secondary | ICD-10-CM | POA: Diagnosis not present

## 2018-09-22 DIAGNOSIS — Z23 Encounter for immunization: Secondary | ICD-10-CM | POA: Diagnosis not present

## 2018-09-22 DIAGNOSIS — D509 Iron deficiency anemia, unspecified: Secondary | ICD-10-CM | POA: Diagnosis not present

## 2018-09-22 DIAGNOSIS — Z992 Dependence on renal dialysis: Secondary | ICD-10-CM | POA: Diagnosis not present

## 2018-09-22 DIAGNOSIS — D631 Anemia in chronic kidney disease: Secondary | ICD-10-CM | POA: Diagnosis not present

## 2018-09-22 DIAGNOSIS — N186 End stage renal disease: Secondary | ICD-10-CM | POA: Diagnosis not present

## 2018-09-22 DIAGNOSIS — N2581 Secondary hyperparathyroidism of renal origin: Secondary | ICD-10-CM | POA: Diagnosis not present

## 2018-09-25 DIAGNOSIS — Z23 Encounter for immunization: Secondary | ICD-10-CM | POA: Diagnosis not present

## 2018-09-25 DIAGNOSIS — N186 End stage renal disease: Secondary | ICD-10-CM | POA: Diagnosis not present

## 2018-09-25 DIAGNOSIS — D509 Iron deficiency anemia, unspecified: Secondary | ICD-10-CM | POA: Diagnosis not present

## 2018-09-25 DIAGNOSIS — N2581 Secondary hyperparathyroidism of renal origin: Secondary | ICD-10-CM | POA: Diagnosis not present

## 2018-09-25 DIAGNOSIS — Z992 Dependence on renal dialysis: Secondary | ICD-10-CM | POA: Diagnosis not present

## 2018-09-25 DIAGNOSIS — D631 Anemia in chronic kidney disease: Secondary | ICD-10-CM | POA: Diagnosis not present

## 2018-09-27 DIAGNOSIS — Z992 Dependence on renal dialysis: Secondary | ICD-10-CM | POA: Diagnosis not present

## 2018-09-27 DIAGNOSIS — D509 Iron deficiency anemia, unspecified: Secondary | ICD-10-CM | POA: Diagnosis not present

## 2018-09-27 DIAGNOSIS — D631 Anemia in chronic kidney disease: Secondary | ICD-10-CM | POA: Diagnosis not present

## 2018-09-27 DIAGNOSIS — Z23 Encounter for immunization: Secondary | ICD-10-CM | POA: Diagnosis not present

## 2018-09-27 DIAGNOSIS — N186 End stage renal disease: Secondary | ICD-10-CM | POA: Diagnosis not present

## 2018-09-27 DIAGNOSIS — N2581 Secondary hyperparathyroidism of renal origin: Secondary | ICD-10-CM | POA: Diagnosis not present

## 2018-09-29 DIAGNOSIS — N2581 Secondary hyperparathyroidism of renal origin: Secondary | ICD-10-CM | POA: Diagnosis not present

## 2018-09-29 DIAGNOSIS — D509 Iron deficiency anemia, unspecified: Secondary | ICD-10-CM | POA: Diagnosis not present

## 2018-09-29 DIAGNOSIS — Z992 Dependence on renal dialysis: Secondary | ICD-10-CM | POA: Diagnosis not present

## 2018-09-29 DIAGNOSIS — N186 End stage renal disease: Secondary | ICD-10-CM | POA: Diagnosis not present

## 2018-09-29 DIAGNOSIS — D631 Anemia in chronic kidney disease: Secondary | ICD-10-CM | POA: Diagnosis not present

## 2018-09-29 DIAGNOSIS — Z23 Encounter for immunization: Secondary | ICD-10-CM | POA: Diagnosis not present

## 2018-10-02 DIAGNOSIS — D631 Anemia in chronic kidney disease: Secondary | ICD-10-CM | POA: Diagnosis not present

## 2018-10-02 DIAGNOSIS — Z992 Dependence on renal dialysis: Secondary | ICD-10-CM | POA: Diagnosis not present

## 2018-10-02 DIAGNOSIS — Z23 Encounter for immunization: Secondary | ICD-10-CM | POA: Diagnosis not present

## 2018-10-02 DIAGNOSIS — N2581 Secondary hyperparathyroidism of renal origin: Secondary | ICD-10-CM | POA: Diagnosis not present

## 2018-10-02 DIAGNOSIS — N186 End stage renal disease: Secondary | ICD-10-CM | POA: Diagnosis not present

## 2018-10-02 DIAGNOSIS — D509 Iron deficiency anemia, unspecified: Secondary | ICD-10-CM | POA: Diagnosis not present

## 2018-10-04 DIAGNOSIS — D631 Anemia in chronic kidney disease: Secondary | ICD-10-CM | POA: Diagnosis not present

## 2018-10-04 DIAGNOSIS — Z992 Dependence on renal dialysis: Secondary | ICD-10-CM | POA: Diagnosis not present

## 2018-10-04 DIAGNOSIS — N2581 Secondary hyperparathyroidism of renal origin: Secondary | ICD-10-CM | POA: Diagnosis not present

## 2018-10-04 DIAGNOSIS — N186 End stage renal disease: Secondary | ICD-10-CM | POA: Diagnosis not present

## 2018-10-04 DIAGNOSIS — Z23 Encounter for immunization: Secondary | ICD-10-CM | POA: Diagnosis not present

## 2018-10-04 DIAGNOSIS — D509 Iron deficiency anemia, unspecified: Secondary | ICD-10-CM | POA: Diagnosis not present

## 2018-10-06 DIAGNOSIS — Z23 Encounter for immunization: Secondary | ICD-10-CM | POA: Diagnosis not present

## 2018-10-06 DIAGNOSIS — N186 End stage renal disease: Secondary | ICD-10-CM | POA: Diagnosis not present

## 2018-10-06 DIAGNOSIS — D509 Iron deficiency anemia, unspecified: Secondary | ICD-10-CM | POA: Diagnosis not present

## 2018-10-06 DIAGNOSIS — D631 Anemia in chronic kidney disease: Secondary | ICD-10-CM | POA: Diagnosis not present

## 2018-10-06 DIAGNOSIS — N2581 Secondary hyperparathyroidism of renal origin: Secondary | ICD-10-CM | POA: Diagnosis not present

## 2018-10-06 DIAGNOSIS — Z992 Dependence on renal dialysis: Secondary | ICD-10-CM | POA: Diagnosis not present

## 2018-10-09 DIAGNOSIS — Z23 Encounter for immunization: Secondary | ICD-10-CM | POA: Diagnosis not present

## 2018-10-09 DIAGNOSIS — N2581 Secondary hyperparathyroidism of renal origin: Secondary | ICD-10-CM | POA: Diagnosis not present

## 2018-10-09 DIAGNOSIS — D509 Iron deficiency anemia, unspecified: Secondary | ICD-10-CM | POA: Diagnosis not present

## 2018-10-09 DIAGNOSIS — D631 Anemia in chronic kidney disease: Secondary | ICD-10-CM | POA: Diagnosis not present

## 2018-10-09 DIAGNOSIS — Z992 Dependence on renal dialysis: Secondary | ICD-10-CM | POA: Diagnosis not present

## 2018-10-09 DIAGNOSIS — N186 End stage renal disease: Secondary | ICD-10-CM | POA: Diagnosis not present

## 2018-10-11 DIAGNOSIS — D509 Iron deficiency anemia, unspecified: Secondary | ICD-10-CM | POA: Diagnosis not present

## 2018-10-11 DIAGNOSIS — D631 Anemia in chronic kidney disease: Secondary | ICD-10-CM | POA: Diagnosis not present

## 2018-10-11 DIAGNOSIS — N2581 Secondary hyperparathyroidism of renal origin: Secondary | ICD-10-CM | POA: Diagnosis not present

## 2018-10-11 DIAGNOSIS — Z23 Encounter for immunization: Secondary | ICD-10-CM | POA: Diagnosis not present

## 2018-10-11 DIAGNOSIS — N186 End stage renal disease: Secondary | ICD-10-CM | POA: Diagnosis not present

## 2018-10-11 DIAGNOSIS — Z992 Dependence on renal dialysis: Secondary | ICD-10-CM | POA: Diagnosis not present

## 2018-10-13 DIAGNOSIS — D509 Iron deficiency anemia, unspecified: Secondary | ICD-10-CM | POA: Diagnosis not present

## 2018-10-13 DIAGNOSIS — Z992 Dependence on renal dialysis: Secondary | ICD-10-CM | POA: Diagnosis not present

## 2018-10-13 DIAGNOSIS — D631 Anemia in chronic kidney disease: Secondary | ICD-10-CM | POA: Diagnosis not present

## 2018-10-13 DIAGNOSIS — Z23 Encounter for immunization: Secondary | ICD-10-CM | POA: Diagnosis not present

## 2018-10-13 DIAGNOSIS — N186 End stage renal disease: Secondary | ICD-10-CM | POA: Diagnosis not present

## 2018-10-13 DIAGNOSIS — N2581 Secondary hyperparathyroidism of renal origin: Secondary | ICD-10-CM | POA: Diagnosis not present

## 2018-10-16 DIAGNOSIS — Z992 Dependence on renal dialysis: Secondary | ICD-10-CM | POA: Diagnosis not present

## 2018-10-16 DIAGNOSIS — D509 Iron deficiency anemia, unspecified: Secondary | ICD-10-CM | POA: Diagnosis not present

## 2018-10-16 DIAGNOSIS — D631 Anemia in chronic kidney disease: Secondary | ICD-10-CM | POA: Diagnosis not present

## 2018-10-16 DIAGNOSIS — N2581 Secondary hyperparathyroidism of renal origin: Secondary | ICD-10-CM | POA: Diagnosis not present

## 2018-10-16 DIAGNOSIS — N186 End stage renal disease: Secondary | ICD-10-CM | POA: Diagnosis not present

## 2018-10-18 DIAGNOSIS — N186 End stage renal disease: Secondary | ICD-10-CM | POA: Diagnosis not present

## 2018-10-18 DIAGNOSIS — D631 Anemia in chronic kidney disease: Secondary | ICD-10-CM | POA: Diagnosis not present

## 2018-10-18 DIAGNOSIS — D509 Iron deficiency anemia, unspecified: Secondary | ICD-10-CM | POA: Diagnosis not present

## 2018-10-18 DIAGNOSIS — Z992 Dependence on renal dialysis: Secondary | ICD-10-CM | POA: Diagnosis not present

## 2018-10-18 DIAGNOSIS — N2581 Secondary hyperparathyroidism of renal origin: Secondary | ICD-10-CM | POA: Diagnosis not present

## 2018-10-20 DIAGNOSIS — Z992 Dependence on renal dialysis: Secondary | ICD-10-CM | POA: Diagnosis not present

## 2018-10-20 DIAGNOSIS — N2581 Secondary hyperparathyroidism of renal origin: Secondary | ICD-10-CM | POA: Diagnosis not present

## 2018-10-20 DIAGNOSIS — N186 End stage renal disease: Secondary | ICD-10-CM | POA: Diagnosis not present

## 2018-10-20 DIAGNOSIS — D509 Iron deficiency anemia, unspecified: Secondary | ICD-10-CM | POA: Diagnosis not present

## 2018-10-20 DIAGNOSIS — D631 Anemia in chronic kidney disease: Secondary | ICD-10-CM | POA: Diagnosis not present

## 2018-10-23 DIAGNOSIS — N2581 Secondary hyperparathyroidism of renal origin: Secondary | ICD-10-CM | POA: Diagnosis not present

## 2018-10-23 DIAGNOSIS — Z992 Dependence on renal dialysis: Secondary | ICD-10-CM | POA: Diagnosis not present

## 2018-10-23 DIAGNOSIS — N186 End stage renal disease: Secondary | ICD-10-CM | POA: Diagnosis not present

## 2018-10-23 DIAGNOSIS — D509 Iron deficiency anemia, unspecified: Secondary | ICD-10-CM | POA: Diagnosis not present

## 2018-10-23 DIAGNOSIS — D631 Anemia in chronic kidney disease: Secondary | ICD-10-CM | POA: Diagnosis not present

## 2018-10-25 DIAGNOSIS — N186 End stage renal disease: Secondary | ICD-10-CM | POA: Diagnosis not present

## 2018-10-25 DIAGNOSIS — Z992 Dependence on renal dialysis: Secondary | ICD-10-CM | POA: Diagnosis not present

## 2018-10-25 DIAGNOSIS — N2581 Secondary hyperparathyroidism of renal origin: Secondary | ICD-10-CM | POA: Diagnosis not present

## 2018-10-25 DIAGNOSIS — D631 Anemia in chronic kidney disease: Secondary | ICD-10-CM | POA: Diagnosis not present

## 2018-10-25 DIAGNOSIS — D509 Iron deficiency anemia, unspecified: Secondary | ICD-10-CM | POA: Diagnosis not present

## 2018-10-27 DIAGNOSIS — D631 Anemia in chronic kidney disease: Secondary | ICD-10-CM | POA: Diagnosis not present

## 2018-10-27 DIAGNOSIS — N186 End stage renal disease: Secondary | ICD-10-CM | POA: Diagnosis not present

## 2018-10-27 DIAGNOSIS — D509 Iron deficiency anemia, unspecified: Secondary | ICD-10-CM | POA: Diagnosis not present

## 2018-10-27 DIAGNOSIS — N2581 Secondary hyperparathyroidism of renal origin: Secondary | ICD-10-CM | POA: Diagnosis not present

## 2018-10-27 DIAGNOSIS — Z992 Dependence on renal dialysis: Secondary | ICD-10-CM | POA: Diagnosis not present

## 2018-10-30 DIAGNOSIS — D631 Anemia in chronic kidney disease: Secondary | ICD-10-CM | POA: Diagnosis not present

## 2018-10-30 DIAGNOSIS — Z992 Dependence on renal dialysis: Secondary | ICD-10-CM | POA: Diagnosis not present

## 2018-10-30 DIAGNOSIS — D509 Iron deficiency anemia, unspecified: Secondary | ICD-10-CM | POA: Diagnosis not present

## 2018-10-30 DIAGNOSIS — N186 End stage renal disease: Secondary | ICD-10-CM | POA: Diagnosis not present

## 2018-10-30 DIAGNOSIS — N2581 Secondary hyperparathyroidism of renal origin: Secondary | ICD-10-CM | POA: Diagnosis not present

## 2018-11-01 DIAGNOSIS — D509 Iron deficiency anemia, unspecified: Secondary | ICD-10-CM | POA: Diagnosis not present

## 2018-11-01 DIAGNOSIS — N186 End stage renal disease: Secondary | ICD-10-CM | POA: Diagnosis not present

## 2018-11-01 DIAGNOSIS — Z992 Dependence on renal dialysis: Secondary | ICD-10-CM | POA: Diagnosis not present

## 2018-11-01 DIAGNOSIS — N2581 Secondary hyperparathyroidism of renal origin: Secondary | ICD-10-CM | POA: Diagnosis not present

## 2018-11-01 DIAGNOSIS — D631 Anemia in chronic kidney disease: Secondary | ICD-10-CM | POA: Diagnosis not present

## 2018-11-03 DIAGNOSIS — D631 Anemia in chronic kidney disease: Secondary | ICD-10-CM | POA: Diagnosis not present

## 2018-11-03 DIAGNOSIS — D509 Iron deficiency anemia, unspecified: Secondary | ICD-10-CM | POA: Diagnosis not present

## 2018-11-03 DIAGNOSIS — Z992 Dependence on renal dialysis: Secondary | ICD-10-CM | POA: Diagnosis not present

## 2018-11-03 DIAGNOSIS — N2581 Secondary hyperparathyroidism of renal origin: Secondary | ICD-10-CM | POA: Diagnosis not present

## 2018-11-03 DIAGNOSIS — N186 End stage renal disease: Secondary | ICD-10-CM | POA: Diagnosis not present

## 2018-11-06 DIAGNOSIS — N186 End stage renal disease: Secondary | ICD-10-CM | POA: Diagnosis not present

## 2018-11-06 DIAGNOSIS — N2581 Secondary hyperparathyroidism of renal origin: Secondary | ICD-10-CM | POA: Diagnosis not present

## 2018-11-06 DIAGNOSIS — D509 Iron deficiency anemia, unspecified: Secondary | ICD-10-CM | POA: Diagnosis not present

## 2018-11-06 DIAGNOSIS — D631 Anemia in chronic kidney disease: Secondary | ICD-10-CM | POA: Diagnosis not present

## 2018-11-06 DIAGNOSIS — Z992 Dependence on renal dialysis: Secondary | ICD-10-CM | POA: Diagnosis not present

## 2018-11-08 DIAGNOSIS — N186 End stage renal disease: Secondary | ICD-10-CM | POA: Diagnosis not present

## 2018-11-08 DIAGNOSIS — D509 Iron deficiency anemia, unspecified: Secondary | ICD-10-CM | POA: Diagnosis not present

## 2018-11-08 DIAGNOSIS — Z992 Dependence on renal dialysis: Secondary | ICD-10-CM | POA: Diagnosis not present

## 2018-11-08 DIAGNOSIS — D631 Anemia in chronic kidney disease: Secondary | ICD-10-CM | POA: Diagnosis not present

## 2018-11-08 DIAGNOSIS — N2581 Secondary hyperparathyroidism of renal origin: Secondary | ICD-10-CM | POA: Diagnosis not present

## 2018-11-10 DIAGNOSIS — D509 Iron deficiency anemia, unspecified: Secondary | ICD-10-CM | POA: Diagnosis not present

## 2018-11-10 DIAGNOSIS — N2581 Secondary hyperparathyroidism of renal origin: Secondary | ICD-10-CM | POA: Diagnosis not present

## 2018-11-10 DIAGNOSIS — Z992 Dependence on renal dialysis: Secondary | ICD-10-CM | POA: Diagnosis not present

## 2018-11-10 DIAGNOSIS — N186 End stage renal disease: Secondary | ICD-10-CM | POA: Diagnosis not present

## 2018-11-10 DIAGNOSIS — D631 Anemia in chronic kidney disease: Secondary | ICD-10-CM | POA: Diagnosis not present

## 2018-11-11 DIAGNOSIS — N186 End stage renal disease: Secondary | ICD-10-CM | POA: Diagnosis not present

## 2018-11-11 DIAGNOSIS — Z992 Dependence on renal dialysis: Secondary | ICD-10-CM | POA: Diagnosis not present

## 2018-11-13 DIAGNOSIS — N2581 Secondary hyperparathyroidism of renal origin: Secondary | ICD-10-CM | POA: Diagnosis not present

## 2018-11-13 DIAGNOSIS — N186 End stage renal disease: Secondary | ICD-10-CM | POA: Diagnosis not present

## 2018-11-13 DIAGNOSIS — D631 Anemia in chronic kidney disease: Secondary | ICD-10-CM | POA: Diagnosis not present

## 2018-11-13 DIAGNOSIS — D509 Iron deficiency anemia, unspecified: Secondary | ICD-10-CM | POA: Diagnosis not present

## 2018-11-13 DIAGNOSIS — Z992 Dependence on renal dialysis: Secondary | ICD-10-CM | POA: Diagnosis not present

## 2018-11-15 DIAGNOSIS — N2581 Secondary hyperparathyroidism of renal origin: Secondary | ICD-10-CM | POA: Diagnosis not present

## 2018-11-15 DIAGNOSIS — D509 Iron deficiency anemia, unspecified: Secondary | ICD-10-CM | POA: Diagnosis not present

## 2018-11-15 DIAGNOSIS — Z992 Dependence on renal dialysis: Secondary | ICD-10-CM | POA: Diagnosis not present

## 2018-11-15 DIAGNOSIS — D631 Anemia in chronic kidney disease: Secondary | ICD-10-CM | POA: Diagnosis not present

## 2018-11-15 DIAGNOSIS — N186 End stage renal disease: Secondary | ICD-10-CM | POA: Diagnosis not present

## 2018-11-17 DIAGNOSIS — Z992 Dependence on renal dialysis: Secondary | ICD-10-CM | POA: Diagnosis not present

## 2018-11-17 DIAGNOSIS — N2581 Secondary hyperparathyroidism of renal origin: Secondary | ICD-10-CM | POA: Diagnosis not present

## 2018-11-17 DIAGNOSIS — D631 Anemia in chronic kidney disease: Secondary | ICD-10-CM | POA: Diagnosis not present

## 2018-11-17 DIAGNOSIS — D509 Iron deficiency anemia, unspecified: Secondary | ICD-10-CM | POA: Diagnosis not present

## 2018-11-17 DIAGNOSIS — N186 End stage renal disease: Secondary | ICD-10-CM | POA: Diagnosis not present

## 2018-11-20 DIAGNOSIS — Z992 Dependence on renal dialysis: Secondary | ICD-10-CM | POA: Diagnosis not present

## 2018-11-20 DIAGNOSIS — N2581 Secondary hyperparathyroidism of renal origin: Secondary | ICD-10-CM | POA: Diagnosis not present

## 2018-11-20 DIAGNOSIS — N186 End stage renal disease: Secondary | ICD-10-CM | POA: Diagnosis not present

## 2018-11-20 DIAGNOSIS — D631 Anemia in chronic kidney disease: Secondary | ICD-10-CM | POA: Diagnosis not present

## 2018-11-20 DIAGNOSIS — D509 Iron deficiency anemia, unspecified: Secondary | ICD-10-CM | POA: Diagnosis not present

## 2018-11-22 DIAGNOSIS — Z992 Dependence on renal dialysis: Secondary | ICD-10-CM | POA: Diagnosis not present

## 2018-11-22 DIAGNOSIS — D631 Anemia in chronic kidney disease: Secondary | ICD-10-CM | POA: Diagnosis not present

## 2018-11-22 DIAGNOSIS — N186 End stage renal disease: Secondary | ICD-10-CM | POA: Diagnosis not present

## 2018-11-22 DIAGNOSIS — D509 Iron deficiency anemia, unspecified: Secondary | ICD-10-CM | POA: Diagnosis not present

## 2018-11-22 DIAGNOSIS — N2581 Secondary hyperparathyroidism of renal origin: Secondary | ICD-10-CM | POA: Diagnosis not present

## 2018-11-24 DIAGNOSIS — N2581 Secondary hyperparathyroidism of renal origin: Secondary | ICD-10-CM | POA: Diagnosis not present

## 2018-11-24 DIAGNOSIS — D631 Anemia in chronic kidney disease: Secondary | ICD-10-CM | POA: Diagnosis not present

## 2018-11-24 DIAGNOSIS — Z992 Dependence on renal dialysis: Secondary | ICD-10-CM | POA: Diagnosis not present

## 2018-11-24 DIAGNOSIS — D509 Iron deficiency anemia, unspecified: Secondary | ICD-10-CM | POA: Diagnosis not present

## 2018-11-24 DIAGNOSIS — N186 End stage renal disease: Secondary | ICD-10-CM | POA: Diagnosis not present

## 2018-11-27 DIAGNOSIS — D509 Iron deficiency anemia, unspecified: Secondary | ICD-10-CM | POA: Diagnosis not present

## 2018-11-27 DIAGNOSIS — D631 Anemia in chronic kidney disease: Secondary | ICD-10-CM | POA: Diagnosis not present

## 2018-11-27 DIAGNOSIS — N2581 Secondary hyperparathyroidism of renal origin: Secondary | ICD-10-CM | POA: Diagnosis not present

## 2018-11-27 DIAGNOSIS — N186 End stage renal disease: Secondary | ICD-10-CM | POA: Diagnosis not present

## 2018-11-27 DIAGNOSIS — Z992 Dependence on renal dialysis: Secondary | ICD-10-CM | POA: Diagnosis not present

## 2018-11-28 ENCOUNTER — Other Ambulatory Visit: Payer: Self-pay

## 2018-11-28 ENCOUNTER — Ambulatory Visit (INDEPENDENT_AMBULATORY_CARE_PROVIDER_SITE_OTHER): Payer: Medicare Other | Admitting: Nurse Practitioner

## 2018-11-28 ENCOUNTER — Ambulatory Visit (INDEPENDENT_AMBULATORY_CARE_PROVIDER_SITE_OTHER): Payer: Medicare Other

## 2018-11-28 ENCOUNTER — Encounter (INDEPENDENT_AMBULATORY_CARE_PROVIDER_SITE_OTHER): Payer: Self-pay | Admitting: Nurse Practitioner

## 2018-11-28 VITALS — BP 176/82 | HR 63 | Resp 10 | Ht 67.0 in | Wt 164.0 lb

## 2018-11-28 DIAGNOSIS — T829XXD Unspecified complication of cardiac and vascular prosthetic device, implant and graft, subsequent encounter: Secondary | ICD-10-CM

## 2018-11-28 DIAGNOSIS — N186 End stage renal disease: Secondary | ICD-10-CM | POA: Diagnosis not present

## 2018-11-28 DIAGNOSIS — Z79899 Other long term (current) drug therapy: Secondary | ICD-10-CM

## 2018-11-28 DIAGNOSIS — I10 Essential (primary) hypertension: Secondary | ICD-10-CM | POA: Diagnosis not present

## 2018-11-28 DIAGNOSIS — T82898A Other specified complication of vascular prosthetic devices, implants and grafts, initial encounter: Secondary | ICD-10-CM | POA: Diagnosis not present

## 2018-11-28 DIAGNOSIS — I2510 Atherosclerotic heart disease of native coronary artery without angina pectoris: Secondary | ICD-10-CM

## 2018-11-28 DIAGNOSIS — F17210 Nicotine dependence, cigarettes, uncomplicated: Secondary | ICD-10-CM | POA: Diagnosis not present

## 2018-11-28 DIAGNOSIS — J431 Panlobular emphysema: Secondary | ICD-10-CM | POA: Diagnosis not present

## 2018-11-28 DIAGNOSIS — Z992 Dependence on renal dialysis: Secondary | ICD-10-CM

## 2018-11-28 DIAGNOSIS — Z720 Tobacco use: Secondary | ICD-10-CM

## 2018-11-28 DIAGNOSIS — T829XXA Unspecified complication of cardiac and vascular prosthetic device, implant and graft, initial encounter: Secondary | ICD-10-CM | POA: Insufficient documentation

## 2018-11-28 NOTE — Progress Notes (Signed)
SUBJECTIVE:  Patient ID: Daryl Weeks, male    DOB: 04-May-1961, 58 y.o.   MRN: [id number] Chief Complaint  Patient presents with  . Follow-up    HPI  Daryl Weeks is a 58 y.o. male that presents today for evaluation of his dialysis access.  He was referred via his dialysis center and nephrologist, Dr. Candiss Norse, with concern for his aneurysmal fistula.  Since he was last seen his aneurysm has increased in size with some early signs of skin threatening.  Patient denies any excessive bleeding times.  He denies any fever, chills, nausea, vomiting or diarrhea.  He denies any chest pain or shortness of breath.  The patient underwent a hemodialysis access duplex today which revealed a flow volume of 831.  The AV fistula is patent with no hemodynamically significant stenosis.  He has a left radial basilic AV fistula.  There is aneurysmal dilation of the proximal to mid forearm level outflow vein.  There is also no flow detected in the left radial artery distal to the anastomosis consistent with previous ligation.  Past Medical History:  Diagnosis Date  . Anemia   . Anxiety   . Arthritis   . CHF (congestive heart failure) ([place])   . Colon polyps   . COPD (chronic obstructive pulmonary disease) ([place])   . Coronary artery disease    Non-ST elevation myocardial infarction in February of 2015. In the setting of hypertensive urgency and blood loss anemia. Cardiac catheterization showed significant three-vessel coronary artery disease. EF was 55% by echo. He underwent CABG at [facility] on May 19 with LIMA to LAD, Sequential SVG to OM1 and OM2, and SVG to RCA  . End stage renal disease ([name])    m-w-f   -dav[name]  . End stage renal disease ([name])   . ETOH abuse   . GERD (gastroesophageal reflux disease)    hx  . Hyperlipidemia   . Hypertension   . IgA nephropathy   . Lower GI bleed    Due to colon polyps. Status post resection of 14 polyps  . Non-ST elevation MI (NSTEMI) ([name])   . Pneumonia    hx  . Rheumatoid arthritis ([name])   . Shortness of breath   . Sleep apnea   . Tobacco abuse   . Tobacco abuse     Past Surgical History:  Procedure Laterality Date  . A/V FISTULAGRAM Left 10/28/2016   Procedure: A/V Fistulagram;  Surgeon: [name], MD;  Location: [facility];  Service: Cardiovascular;  Laterality: Left;  . A/V SHUNT INTERVENTION N/A 10/28/2016   Procedure: A/V Shunt Intervention;  Surgeon: [name], MD;  Location: [facility];  Service: Cardiovascular;  Laterality: N/A;  . AV FISTULA PLACEMENT    . CARDIAC CATHETERIZATION     RCA 90% and calcified mid LAD 80% Stenosis  . CORONARY ARTERY BYPASS GRAFT N/A 11/29/2013   Procedure: CORONARY ARTERY BYPASS GRAFTING (CABG) x 4 using endoscopically harvested right saphenous vein and left internal mammary artery;  Surgeon: [name], MD;  Location: [facility] OR;  Service: Open Heart Surgery;  Laterality: N/A;  . dialysis catheterr  2/15  . INTRAOPERATIVE TRANSESOPHAGEAL ECHOCARDIOGRAM N/A 11/29/2013   Procedure: INTRAOPERATIVE TRANSESOPHAGEAL ECHOCARDIOGRAM;  Surgeon: [name], MD;  Location[facility] OR;  Service: Open Heart Surgery;  Laterality: N/A;  . PERIPHERAL VASCULAR CATHETERIZATION N/A 06/12/2015   Procedure: A/V Shuntogram/Fistulagram;  Surgeon: [name], MD;  Location: [facility];  Service: Cardiovascular;  Laterality: N/A;  . PERIPHERAL VASCULAR CATHETERIZATION Left 06/12/2015   Procedure: A/V Shunt Intervention;  Surgeon: [name], MD;  Location: [facility];  Service: Cardiovascular;  Laterality: Left;  . RENAL BIOPSY Left 14    Social History   Socioeconomic History  . Marital status: Single    Spouse name: Not on file  . Number of children: Not on file  . Years of education: Not on file  . Highest education level: 8th grade  Occupational History  . Not on file  Social Needs  . Financial resource strain: Not hard at all  . Food insecurity:    Worry: Never true     Inability: Never true  . Transportation needs:    Medical: No    Non-medical: No  Tobacco Use  . Smoking status: Current Every Day Smoker    Packs/day: 0.50    Years: 32.00    Pack years: 16.00    Types: Cigarettes  . Smokeless tobacco: Never Used  . Tobacco comment: daily  Substance and Sexual Activity  . Alcohol use: Yes    Alcohol/week: 6.0 standard drinks    Types: 6 Cans of beer per week    Comment: weekly  . Drug use: No  . Sexual activity: Not on file  Lifestyle  . Physical activity:    Days per week: 0 days    Minutes per session: 0 min  . Stress: Not at all  Relationships  . Social connections:    Talks on phone: More than three times a week    Gets together: More than three times a week    Attends religious service: Never    Active member of club or organization: No    Attends meetings of clubs or organizations: Never    Relationship status: Divorced  . Intimate partner violence:    Fear of current or ex partner: No    Emotionally abused: No    Physically abused: No    Forced sexual activity: No  Other Topics Concern  . Not on file  Social History Narrative  . Not on file    Family History  Problem Relation Age of Onset  . Heart disease Father   . Heart disease Brother   . Healthy Sister   . Stroke Neg Hx     No Known Allergies   Review of Systems   Review of Systems: Negative Unless Checked Constitutional: [] Weight loss  [] Fever  [] Chills Cardiac: [] Chest pain   []  Atrial Fibrillation  [] Palpitations   [] Shortness of breath when laying flat   [] Shortness of breath with exertion. [] Shortness of breath at rest Vascular:  [] Pain in legs with walking   [] Pain in legs with standing [] Pain in legs when laying flat   [] Claudication    [] Pain in feet when laying flat    [] History of DVT   [] Phlebitis   [] Swelling in legs   [] Varicose veins   [] Non-healing ulcers Pulmonary:   [] Uses home oxygen   [] Productive cough   [] Hemoptysis   [] Wheeze  [x] COPD    [] Asthma Neurologic:  [] Dizziness   [] Seizures  [] Blackouts [] History of stroke   [] History of TIA  [] Aphasia   [] Temporary Blindness   [] Weakness or numbness in arm   [] Weakness or numbness in leg Musculoskeletal:   [] Joint swelling   [] Joint pain   [] Low back pain  []  History of Knee Replacement [] Arthritis [] back Surgeries  []  Spinal Stenosis    Hematologic:  []   Easy bruising  [] Easy bleeding   [] Hypercoagulable state   [x] Anemic Gastrointestinal:  [] Diarrhea   [] Vomiting  [] Gastroesophageal reflux/heartburn   [] Difficulty swallowing. [] Abdominal pain Genitourinary:  [x] Chronic kidney disease   [] Difficult urination  [] Anuric   [] Blood in urine [] Frequent urination  [] Burning with urination   [] Hematuria Skin:  [] Rashes   [] Ulcers [] Wounds Psychological:  [] History of anxiety   []  History of major depression  []  Memory Difficulties      OBJECTIVE:   Physical Exam  BP (!) 176/82 (BP Location: Left Arm, Patient Position: Sitting, Cuff Size: Small)   Pulse 63   Resp 10   Ht 5\' 7"  (1.702 m)   Wt 164 lb (74.4 kg)   BMI 25.69 kg/m   Gen: WD/WN, NAD Head: [place]/AT, No temporalis wasting.  Ear/Nose/Throat: Hearing grossly intact, nares w/o erythema or drainage Eyes: PER, EOMI, sclera nonicteric.  Neck: Supple, no masses.  No JVD.  Pulmonary:  Good air movement, no use of accessory muscles.  Cardiac: RRR Vascular:  Large 2.5 cm aneurysm of forearm.  Some skin threatening.  Good thrill and bruit at the distal portion of the fistula and aneurysm.  However at the second loop of the aneurysm it is pulsatile with no thrill or bruit. Vessel Right Left  Radial Not Palpable Palpable   Gastrointestinal: soft, non-distended. No guarding/no peritoneal signs.  Musculoskeletal: M/S 5/5 throughout.  No deformity or atrophy.  Neurologic: Pain and light touch intact in extremities.  Symmetrical.  Speech is fluent. Motor exam as listed above. Psychiatric: Judgment intact, Mood & affect appropriate for pt's  clinical situation. Dermatologic: No Venous rashes. No Ulcers Noted.  No changes consistent with cellulitis. Lymph : No Cervical lymphadenopathy, no lichenification or skin changes of chronic lymphedema.       ASSESSMENT AND PLAN:  1. Complication of vascular access for dialysis, subsequent encounter  Recommend:  Spoke extensively with the patient regarding revision of his dialysis access.  The patient was advised that we could search for a new access in a different place order given the fact of his young age we could revise this fistula.  After discussion, the decision was made to revise his current fistula.  Patient will first need to undergo a fistulogram with placement of a PermCath.  Patient should have a fistulagram with the intention for intervention.  The intention for intervention is to restore appropriate flow and prevent thrombosis and possible loss of the access.  As well as improve the quality of dialysis therapy, as well as prepare for surgical intervention.  The risks, benefits and alternative therapies were reviewed in detail with the patient.  All questions were answered.  The patient agrees to proceed with angio/intervention.     Once the PermCath was inserted and the fistulogram is done the patient will need cardiac clearance.  The patient states that his cardiologist is Dr. Fletcher Anon.  Once he has received clearance we will proceed with scheduling the revision of his AV fistula utilizing Artegraft.  Patient also understands that once this is placed it will take an additional 4 weeks for the fistula to mature.  The risks, benefits and alternative therapies were reviewed in detail with the patient.  All questions were answered.  The patient agrees to proceed with surgery.   Again discussed with patient that this will take several steps that it would likely be at least 8 weeks before he has a new usable fistula, possibly longer depending on cardiac clearance.  Patient understands.  2. Panlobular emphysema ([place]) Continue pulmonary medications and aerosols as already ordered, these medications have been reviewed and there are no changes at this time.    3. Coronary artery disease involving native coronary artery of native heart without angina pectoris Continue cardiac and antihypertensive medications as already ordered and reviewed, no changes at this time.  Continue statin as ordered and reviewed, no changes at this time  Nitrates PRN for chest pain   4. Essential hypertension Continue antihypertensive medications as already ordered, these medications have been reviewed and there are no changes at this time.   5. Tobacco abuse Smoking cessation was discussed, 3-10 minutes spent on this topic specifically    Current Outpatient Medications on File Prior to Visit  Medication Sig Dispense Refill  . albuterol (VENTOLIN HFA) 108 (90 Base) MCG/ACT inhaler Inhale 2 puffs into the lungs every 6 (six) hours as needed for wheezing or shortness of breath.    [name] amLODipine (NORVASC) 10 MG tablet take 1 tablet by mouth once daily 30 tablet 5  . aspirin EC 81 MG tablet Take 1 tablet (81 mg total) by mouth daily. 90 tablet 3  . atorvastatin (LIPITOR) 20 MG tablet take 1 tablet by mouth at bedtime 30 tablet 6  . AURYXIA 1 GM 210 MG(Fe) tablet TK 1 T PO  TID BEFORE EACH MEAL  11  . cloNIDine (CATAPRES) 0.1 MG tablet TK 1 T PO BID FOR HIGH BP  11  . doxycycline (VIBRA-TABS) 100 MG tablet Take 100 mg by mouth 2 (two) times daily.    [name] gabapentin (NEURONTIN) 100 MG capsule TK 1 C PO TID  11  . irbesartan (AVAPRO) 150 MG tablet TK 1 T PO D AT NIGHT FOR HIGH BP  11  . lidocaine-prilocaine (EMLA) cream Apply 1 application topically as needed.    . metoprolol tartrate (LOPRESSOR) 100 MG tablet Take 1 tablet (100 mg total) by mouth 2 (two) times daily. 180 tablet 3  . multivitamin (RENA-VIT) TABS tablet TK 1 T PO D  11  . omeprazole (PRILOSEC) 20 MG capsule TK 1 C PO D  5  .  ondansetron (ZOFRAN) 4 MG tablet TK 1 T PO BID PRF NAUSEA  1  . sevelamer carbonate (RENVELA) 800 MG tablet Take 800 mg by mouth 3 (three) times daily with meals.    [name] zolpidem (AMBIEN) 10 MG tablet Take 10 mg by mouth at bedtime as needed for sleep.    . budesonide (PULMICORT FLEXHALER) 180 MCG/ACT inhaler Inhale 1 puff into the lungs 2 (two) times daily. 1 each 11  . budesonide (PULMICORT) 0.5 MG/2ML nebulizer solution Take 2 mLs (0.5 mg total) by nebulization every 12 (twelve) hours. 60 mL 11  . calcium carbonate (TUMS - DOSED IN MG ELEMENTAL CALCIUM) 500 MG chewable tablet Chew 1 tablet by mouth daily.    . cinacalcet (SENSIPAR) 30 MG tablet Take by mouth.    . furosemide (LASIX) 80 MG tablet Take 80 mg by mouth daily.    . irbesartan (AVAPRO) 300 MG tablet TK 1 T PO ATN FOR HIGH BP  0  . tiotropium (SPIRIVA) 18 MCG inhalation capsule Place 1 capsule (18 mcg total) into inhaler and inhale daily. 30 capsule 12   Current Facility-Administered Medications on File Prior to Visit  Medication Dose Route Frequency Provider Last Rate Last Dose  . ceFAZolin (ANCEF) IVPB 1 g/50 mL premix  1 g Intravenous Once Dew, Erskine Squibb, MD  There are no Patient Instructions on file for this visit. No follow-ups on file.   Kris Hartmann, NP  This note was completed with Sales executive.  Any errors are purely unintentional.

## 2018-11-29 DIAGNOSIS — D509 Iron deficiency anemia, unspecified: Secondary | ICD-10-CM | POA: Diagnosis not present

## 2018-11-29 DIAGNOSIS — N2581 Secondary hyperparathyroidism of renal origin: Secondary | ICD-10-CM | POA: Diagnosis not present

## 2018-11-29 DIAGNOSIS — D631 Anemia in chronic kidney disease: Secondary | ICD-10-CM | POA: Diagnosis not present

## 2018-11-29 DIAGNOSIS — N186 End stage renal disease: Secondary | ICD-10-CM | POA: Diagnosis not present

## 2018-11-29 DIAGNOSIS — Z992 Dependence on renal dialysis: Secondary | ICD-10-CM | POA: Diagnosis not present

## 2018-12-01 ENCOUNTER — Encounter (INDEPENDENT_AMBULATORY_CARE_PROVIDER_SITE_OTHER): Payer: Self-pay

## 2018-12-01 DIAGNOSIS — Z992 Dependence on renal dialysis: Secondary | ICD-10-CM | POA: Diagnosis not present

## 2018-12-01 DIAGNOSIS — D631 Anemia in chronic kidney disease: Secondary | ICD-10-CM | POA: Diagnosis not present

## 2018-12-01 DIAGNOSIS — D509 Iron deficiency anemia, unspecified: Secondary | ICD-10-CM | POA: Diagnosis not present

## 2018-12-01 DIAGNOSIS — N2581 Secondary hyperparathyroidism of renal origin: Secondary | ICD-10-CM | POA: Diagnosis not present

## 2018-12-01 DIAGNOSIS — N186 End stage renal disease: Secondary | ICD-10-CM | POA: Diagnosis not present

## 2018-12-02 DIAGNOSIS — N186 End stage renal disease: Secondary | ICD-10-CM | POA: Diagnosis not present

## 2018-12-02 DIAGNOSIS — N2581 Secondary hyperparathyroidism of renal origin: Secondary | ICD-10-CM | POA: Diagnosis not present

## 2018-12-02 DIAGNOSIS — D509 Iron deficiency anemia, unspecified: Secondary | ICD-10-CM | POA: Diagnosis not present

## 2018-12-02 DIAGNOSIS — D631 Anemia in chronic kidney disease: Secondary | ICD-10-CM | POA: Diagnosis not present

## 2018-12-02 DIAGNOSIS — Z992 Dependence on renal dialysis: Secondary | ICD-10-CM | POA: Diagnosis not present

## 2018-12-04 DIAGNOSIS — Z992 Dependence on renal dialysis: Secondary | ICD-10-CM | POA: Diagnosis not present

## 2018-12-04 DIAGNOSIS — D509 Iron deficiency anemia, unspecified: Secondary | ICD-10-CM | POA: Diagnosis not present

## 2018-12-04 DIAGNOSIS — D631 Anemia in chronic kidney disease: Secondary | ICD-10-CM | POA: Diagnosis not present

## 2018-12-04 DIAGNOSIS — N186 End stage renal disease: Secondary | ICD-10-CM | POA: Diagnosis not present

## 2018-12-04 DIAGNOSIS — N2581 Secondary hyperparathyroidism of renal origin: Secondary | ICD-10-CM | POA: Diagnosis not present

## 2018-12-05 ENCOUNTER — Telehealth (INDEPENDENT_AMBULATORY_CARE_PROVIDER_SITE_OTHER): Payer: Self-pay

## 2018-12-05 NOTE — Telephone Encounter (Signed)
Patient was called and given his information about his fistulagram and it was mailed out to the patient earlier. I called the patient today and let him know about the restrictions for the hospital.

## 2018-12-06 ENCOUNTER — Other Ambulatory Visit (INDEPENDENT_AMBULATORY_CARE_PROVIDER_SITE_OTHER): Payer: Self-pay | Admitting: Nurse Practitioner

## 2018-12-06 DIAGNOSIS — N186 End stage renal disease: Secondary | ICD-10-CM | POA: Diagnosis not present

## 2018-12-06 DIAGNOSIS — Z992 Dependence on renal dialysis: Secondary | ICD-10-CM | POA: Diagnosis not present

## 2018-12-06 DIAGNOSIS — D509 Iron deficiency anemia, unspecified: Secondary | ICD-10-CM | POA: Diagnosis not present

## 2018-12-06 DIAGNOSIS — N2581 Secondary hyperparathyroidism of renal origin: Secondary | ICD-10-CM | POA: Diagnosis not present

## 2018-12-06 DIAGNOSIS — D631 Anemia in chronic kidney disease: Secondary | ICD-10-CM | POA: Diagnosis not present

## 2018-12-06 MED ORDER — CEFAZOLIN SODIUM-DEXTROSE 1-4 GM/50ML-% IV SOLN
1.0000 g | Freq: Once | INTRAVENOUS | Status: AC
  Administered 2018-12-07: 1 g via INTRAVENOUS

## 2018-12-07 ENCOUNTER — Encounter: Payer: Self-pay | Admitting: *Deleted

## 2018-12-07 ENCOUNTER — Encounter
Admission: RE | Disposition: A | Discharge status: Home / Self Care | Payer: Self-pay | Source: Home / Self Care | Attending: Vascular Surgery

## 2018-12-07 ENCOUNTER — Other Ambulatory Visit: Payer: Self-pay

## 2018-12-07 ENCOUNTER — Ambulatory Visit
Admission: RE | Admit: 2018-12-07 | Discharge: 2018-12-07 | Disposition: A | Discharge status: Home / Self Care | Payer: Medicare Other | Attending: Vascular Surgery | Admitting: Vascular Surgery

## 2018-12-07 DIAGNOSIS — E785 Hyperlipidemia, unspecified: Secondary | ICD-10-CM | POA: Insufficient documentation

## 2018-12-07 DIAGNOSIS — K219 Gastro-esophageal reflux disease without esophagitis: Secondary | ICD-10-CM | POA: Diagnosis not present

## 2018-12-07 DIAGNOSIS — Y832 Surgical operation with anastomosis, bypass or graft as the cause of abnormal reaction of the patient, or of later complication, without mention of misadventure at the time of the procedure: Secondary | ICD-10-CM | POA: Diagnosis not present

## 2018-12-07 DIAGNOSIS — Z951 Presence of aortocoronary bypass graft: Secondary | ICD-10-CM | POA: Insufficient documentation

## 2018-12-07 DIAGNOSIS — I2510 Atherosclerotic heart disease of native coronary artery without angina pectoris: Secondary | ICD-10-CM | POA: Insufficient documentation

## 2018-12-07 DIAGNOSIS — G4730 Sleep apnea, unspecified: Secondary | ICD-10-CM | POA: Insufficient documentation

## 2018-12-07 DIAGNOSIS — N186 End stage renal disease: Secondary | ICD-10-CM

## 2018-12-07 DIAGNOSIS — Z8249 Family history of ischemic heart disease and other diseases of the circulatory system: Secondary | ICD-10-CM | POA: Diagnosis not present

## 2018-12-07 DIAGNOSIS — J431 Panlobular emphysema: Secondary | ICD-10-CM | POA: Insufficient documentation

## 2018-12-07 DIAGNOSIS — Z7951 Long term (current) use of inhaled steroids: Secondary | ICD-10-CM | POA: Diagnosis not present

## 2018-12-07 DIAGNOSIS — I509 Heart failure, unspecified: Secondary | ICD-10-CM | POA: Insufficient documentation

## 2018-12-07 DIAGNOSIS — I132 Hypertensive heart and chronic kidney disease with heart failure and with stage 5 chronic kidney disease, or end stage renal disease: Secondary | ICD-10-CM | POA: Insufficient documentation

## 2018-12-07 DIAGNOSIS — F17210 Nicotine dependence, cigarettes, uncomplicated: Secondary | ICD-10-CM | POA: Diagnosis not present

## 2018-12-07 DIAGNOSIS — Z79899 Other long term (current) drug therapy: Secondary | ICD-10-CM | POA: Diagnosis not present

## 2018-12-07 DIAGNOSIS — Z7982 Long term (current) use of aspirin: Secondary | ICD-10-CM | POA: Insufficient documentation

## 2018-12-07 DIAGNOSIS — T82838A Hemorrhage of vascular prosthetic devices, implants and grafts, initial encounter: Secondary | ICD-10-CM | POA: Insufficient documentation

## 2018-12-07 DIAGNOSIS — Z992 Dependence on renal dialysis: Secondary | ICD-10-CM | POA: Insufficient documentation

## 2018-12-07 DIAGNOSIS — M069 Rheumatoid arthritis, unspecified: Secondary | ICD-10-CM | POA: Insufficient documentation

## 2018-12-07 DIAGNOSIS — T82898A Other specified complication of vascular prosthetic devices, implants and grafts, initial encounter: Secondary | ICD-10-CM | POA: Diagnosis not present

## 2018-12-07 DIAGNOSIS — M1990 Unspecified osteoarthritis, unspecified site: Secondary | ICD-10-CM | POA: Insufficient documentation

## 2018-12-07 HISTORY — PX: A/V FISTULAGRAM: CATH118298

## 2018-12-07 LAB — POTASSIUM (ARMC VASCULAR LAB ONLY): POTASSIUM (ARMC VASCULAR LAB): 4 (ref 3.5–5.1)

## 2018-12-07 SURGERY — A/V FISTULAGRAM
Anesthesia: Moderate Sedation | Site: Arm Upper | Laterality: Left

## 2018-12-07 MED ORDER — MIDAZOLAM HCL 5 MG/5ML IJ SOLN
INTRAMUSCULAR | Status: AC
  Administered 2018-12-07: 2 mg
  Filled 2018-12-07: qty 5

## 2018-12-07 MED ORDER — FAMOTIDINE 20 MG PO TABS: 40.0000 mg | ORAL_TABLET | Freq: Once | ORAL | Status: DC | PRN

## 2018-12-07 MED ORDER — ONDANSETRON HCL 4 MG/2ML IJ SOLN: 4.0000 mg | Freq: Four times a day (QID) | INTRAMUSCULAR | Status: DC | PRN

## 2018-12-07 MED ORDER — IOHEXOL 300 MG/ML  SOLN
INTRAMUSCULAR | Status: DC | PRN
  Administered 2018-12-07: 30 mL via INTRAVENOUS

## 2018-12-07 MED ORDER — SODIUM CHLORIDE 0.9 % IV SOLN
INTRAVENOUS | Status: DC
  Administered 2018-12-07: 08:00:00 via INTRAVENOUS

## 2018-12-07 MED ORDER — CEFAZOLIN SODIUM-DEXTROSE 1-4 GM/50ML-% IV SOLN
INTRAVENOUS | Status: AC
  Filled 2018-12-07: qty 50

## 2018-12-07 MED ORDER — MIDAZOLAM HCL 2 MG/ML PO SYRP: 8.0000 mg | ORAL_SOLUTION | Freq: Once | ORAL | Status: DC | PRN

## 2018-12-07 MED ORDER — METHYLPREDNISOLONE SODIUM SUCC 125 MG IJ SOLR: 125.0000 mg | Freq: Once | INTRAMUSCULAR | Status: DC | PRN

## 2018-12-07 MED ORDER — DIPHENHYDRAMINE HCL 50 MG/ML IJ SOLN: 50.0000 mg | Freq: Once | INTRAMUSCULAR | Status: DC | PRN

## 2018-12-07 MED ORDER — HYDROMORPHONE HCL 1 MG/ML IJ SOLN: 1.0000 mg | Freq: Once | INTRAMUSCULAR | Status: DC | PRN

## 2018-12-07 MED ORDER — FENTANYL CITRATE (PF) 100 MCG/2ML IJ SOLN
INTRAMUSCULAR | Status: AC
  Administered 2018-12-07: 50 ug
  Filled 2018-12-07: qty 2

## 2018-12-07 SURGICAL SUPPLY — 6 items
CANNULA 5F STIFF (CANNULA) ×3 IMPLANT
DRAPE BRACHIAL (DRAPES) ×3 IMPLANT
PACK ANGIOGRAPHY (CUSTOM PROCEDURE TRAY) ×3 IMPLANT
SHEATH BRITE TIP 6FRX5.5 (SHEATH) ×3 IMPLANT
SUT MNCRL AB 4-0 PS2 18 (SUTURE) ×3 IMPLANT
TOWEL OR 17X26 4PK STRL BLUE (TOWEL DISPOSABLE) ×3 IMPLANT

## 2018-12-07 NOTE — Op Note (Signed)
[facility]    OPERATIVE NOTE   PROCEDURE: 1.   Left radiocephalic arteriovenous fistula cannulation under ultrasound guidance 2.   Left arm fistulagram including central venogram   PRE-OPERATIVE DIAGNOSIS: 1. ESRD 2. Poorly functional left radiocephalic AVF  POST-OPERATIVE DIAGNOSIS: same as above   SURGEON: Leotis Pain, MD  ANESTHESIA: local with MCS  ESTIMATED BLOOD LOSS: 3 cc  FINDING(S): 1. Largely aneurysmal access sites in the forearm fistula with some moderate narrowing between access sites and just distal to the arterial access site.  There would be adequate room for surgical revision with an Artegraft near the radiocephalic anastomosis and anywhere around the elbow to the basilic vein.  The basilic vein was the outflow in the upper arm and was widely patent.  The central venous circulation was widely patent.  SPECIMEN(S):  None  CONTRAST: 30 cc  FLUORO TIME: 1.5 minutes  MODERATE CONSCIOUS SEDATION TIME: Approximately 15 minutes with 2 mg of Versed and 50 Mcg of Fentanyl   INDICATIONS: Daryl Weeks is a 58 y.o. male who presents with malfunctioning largely aneurysmal arteriovenous fistula with bleeding.  The patient is scheduled for left arm fistulagram and likely a surgical revision.  The patient is aware the risks include but are not limited to: bleeding, infection, thrombosis of the cannulated access, and possible anaphylactic reaction to the contrast.  The patient is aware of the risks of the procedure and elects to proceed forward.  DESCRIPTION: After full informed written consent was obtained, the patient was brought back to the angiography suite and placed supine upon the angiography table.  The patient was connected to monitoring equipment. Moderate conscious sedation was administered with a face to face encounter with the patient throughout the procedure with my supervision of the RN administering medicines and monitoring the patient's  vital signs and mental status throughout from the start of the procedure until the patient was taken to the recovery room. The left arm was prepped and draped in the standard fashion for a percutaneous access intervention.  Under ultrasound guidance, the left radiocephalic arteriovenous fistula was cannulated with a micropuncture needle under direct ultrasound guidance where it was patent and a permanent image was performed.  The microwire was advanced into the fistula and the needle was exchanged for the a microsheath. Hand injections were completed to image the access including the central venous system and with compression of the fistula to evaluate the stenosis. This demonstrated largely aneurysmal access sites in the forearm fistula with some moderate narrowing between access sites and just distal to the arterial access site.  There would be adequate room for surgical revision with an Artegraft near the radiocephalic anastomosis and anywhere around the elbow to the basilic vein.  The basilic vein was the outflow in the upper arm and was widely patent.  The central venous circulation was widely patent.  Based on the images, this patient will need open surgical revision and a permcath placement while it heals.  Based on the completion imaging, no further intervention is necessary.  The wire and balloon were removed from the sheath.  A 4-0 Monocryl purse-string suture was sewn around the sheath.  The sheath was removed while tying down the suture.  A sterile bandage was applied to the puncture site.  COMPLICATIONS: None  CONDITION: Stable   Leotis Pain  12/07/2018 8:46 AM   This note was created with Dragon Medical transcription system. Any errors in dictation are purely unintentional.

## 2018-12-07 NOTE — H&P (Signed)
[facility] History & Physical Update  The patient was interviewed and re-examined.  The patient's previous History and Physical has been reviewed and is unchanged.  There is no change in the plan of care. We plan to proceed with the scheduled procedure.  Leotis Pain, MD  12/07/2018, 8:11 AM

## 2018-12-08 ENCOUNTER — Encounter: Payer: Self-pay | Admitting: Vascular Surgery

## 2018-12-08 DIAGNOSIS — N2581 Secondary hyperparathyroidism of renal origin: Secondary | ICD-10-CM | POA: Diagnosis not present

## 2018-12-08 DIAGNOSIS — N186 End stage renal disease: Secondary | ICD-10-CM | POA: Diagnosis not present

## 2018-12-08 DIAGNOSIS — D631 Anemia in chronic kidney disease: Secondary | ICD-10-CM | POA: Diagnosis not present

## 2018-12-08 DIAGNOSIS — Z992 Dependence on renal dialysis: Secondary | ICD-10-CM | POA: Diagnosis not present

## 2018-12-08 DIAGNOSIS — D509 Iron deficiency anemia, unspecified: Secondary | ICD-10-CM | POA: Diagnosis not present

## 2018-12-11 ENCOUNTER — Other Ambulatory Visit (INDEPENDENT_AMBULATORY_CARE_PROVIDER_SITE_OTHER): Payer: Self-pay | Admitting: Nurse Practitioner

## 2018-12-11 DIAGNOSIS — D509 Iron deficiency anemia, unspecified: Secondary | ICD-10-CM | POA: Diagnosis not present

## 2018-12-11 DIAGNOSIS — D631 Anemia in chronic kidney disease: Secondary | ICD-10-CM | POA: Diagnosis not present

## 2018-12-11 DIAGNOSIS — Z992 Dependence on renal dialysis: Secondary | ICD-10-CM | POA: Diagnosis not present

## 2018-12-11 DIAGNOSIS — N186 End stage renal disease: Secondary | ICD-10-CM | POA: Diagnosis not present

## 2018-12-11 DIAGNOSIS — N2581 Secondary hyperparathyroidism of renal origin: Secondary | ICD-10-CM | POA: Diagnosis not present

## 2018-12-12 ENCOUNTER — Encounter (INDEPENDENT_AMBULATORY_CARE_PROVIDER_SITE_OTHER): Payer: Self-pay

## 2018-12-12 ENCOUNTER — Inpatient Hospital Stay: Admission: RE | Admit: 2018-12-12 | Payer: Medicare Other | Source: Ambulatory Visit

## 2018-12-12 ENCOUNTER — Telehealth (INDEPENDENT_AMBULATORY_CARE_PROVIDER_SITE_OTHER): Payer: Self-pay

## 2018-12-12 ENCOUNTER — Other Ambulatory Visit (INDEPENDENT_AMBULATORY_CARE_PROVIDER_SITE_OTHER): Payer: Self-pay | Admitting: Nurse Practitioner

## 2018-12-12 DIAGNOSIS — Z992 Dependence on renal dialysis: Secondary | ICD-10-CM | POA: Diagnosis not present

## 2018-12-12 DIAGNOSIS — N186 End stage renal disease: Secondary | ICD-10-CM | POA: Diagnosis not present

## 2018-12-12 NOTE — Telephone Encounter (Signed)
Patient missed his pre-op appt today and was called by pre-admit. Patient stated he knew nothing about the appt or surgery. I attempted to contact the patient on Friday regarding his pre-op and his surgery, I was unable to leave a message on the home phone but I did on the mobile voicemail. I attempted to contact the patient again today and left a message on the mobile number about contacting me regarding his pre-op and surgery.

## 2018-12-13 ENCOUNTER — Inpatient Hospital Stay: Admission: RE | Admit: 2018-12-13 | Payer: Medicare Other | Source: Ambulatory Visit

## 2018-12-13 ENCOUNTER — Encounter
Admission: RE | Admit: 2018-12-13 | Discharge: 2018-12-13 | Disposition: A | Discharge status: Home / Self Care | Payer: Medicare Other | Source: Ambulatory Visit | Attending: Vascular Surgery | Admitting: Vascular Surgery

## 2018-12-13 ENCOUNTER — Other Ambulatory Visit: Payer: Self-pay

## 2018-12-13 DIAGNOSIS — N2581 Secondary hyperparathyroidism of renal origin: Secondary | ICD-10-CM | POA: Diagnosis not present

## 2018-12-13 DIAGNOSIS — D509 Iron deficiency anemia, unspecified: Secondary | ICD-10-CM | POA: Diagnosis not present

## 2018-12-13 DIAGNOSIS — N186 End stage renal disease: Secondary | ICD-10-CM | POA: Diagnosis not present

## 2018-12-13 DIAGNOSIS — Z992 Dependence on renal dialysis: Secondary | ICD-10-CM | POA: Diagnosis not present

## 2018-12-13 DIAGNOSIS — D631 Anemia in chronic kidney disease: Secondary | ICD-10-CM | POA: Diagnosis not present

## 2018-12-13 HISTORY — DX: Dependence on renal dialysis: Z99.2

## 2018-12-13 HISTORY — DX: End stage renal disease: N18.6

## 2018-12-13 MED ORDER — CEFAZOLIN SODIUM-DEXTROSE 1-4 GM/50ML-% IV SOLN
1.0000 g | INTRAVENOUS | Status: AC
  Administered 2018-12-14: 1 g via INTRAVENOUS

## 2018-12-13 NOTE — Patient Instructions (Signed)
Your procedure is scheduled on: 12-14-18  Report to Same Day Surgery 2nd floor medical mall [facility] Entrance-take elevator on left to 2nd floor.  Check in with surgery information desk.) To find out your arrival time please call [phone] between 1PM - 3PM on 12-13-18  Remember: Instructions that are not followed completely may result in serious medical risk, up to and including death, or upon the discretion of your surgeon and anesthesiologist your surgery may need to be rescheduled.    _x___ 1. Do not eat food after midnight the night before your procedure. You may drink clear liquids up to 2 hours before you are scheduled to arrive at the hospital for your procedure.  Do not drink clear liquids within 2 hours of your scheduled arrival to the hospital.  Clear liquids include  --Water or Apple juice without pulp  --Clear carbohydrate beverage such as ClearFast or Gatorade  --Black Coffee or Clear Tea (No milk, no creamers, do not add anything to the coffee or Tea   ____Ensure clear carbohydrate drink on the way to the hospital for bariatric patients  ____Ensure clear carbohydrate drink 3 hours before surgery for Dr Dwyane Luo patients if physician instructed.   No gum chewing or hard candies.     __x__ 2. No Alcohol for 24 hours before or after surgery.   __x__3. No Smoking or e-cigarettes for 24 prior to surgery.  Do not use any chewable tobacco products for at least 6 hour prior to surgery   ____  4. Bring all medications with you on the day of surgery if instructed.    __x__ 5. Notify your doctor if there is any change in your medical condition     (cold, fever, infections).    x___6. On the morning of surgery brush your teeth with toothpaste and water.  You may rinse your mouth with mouth wash if you wish.  Do not swallow any toothpaste or mouthwash.   Do not wear jewelry, make-up, hairpins, clips or nail polish.  Do not wear lotions, powders, or perfumes. You may wear  deodorant.  Do not shave 48 hours prior to surgery. Men may shave face and neck.  Do not bring valuables to the hospital.    [facility] is not responsible for any belongings or valuables.               Contacts, dentures or bridgework may not be worn into surgery.  Leave your suitcase in the car. After surgery it may be brought to your room.  For patients admitted to the hospital, discharge time is determined by your  treatment team.  _  Patients discharged the day of surgery will not be allowed to drive home.  You will need someone to drive you home and stay with you the night of your procedure.    Please read over the following fact sheets that you were given:   [facility] Preparing for Surgery  _x___ TAKE THE FOLLOWING MEDICATION THE MORNING OF SURGERY WITH A SMALL SIP OF WATER. These include:  1. METOPROLOL  2. GABAPENTIN  3. CLONIDINE  4. PRILOSEC   5. TAKE AN EXTRA PRILOSEC TONIGHT BEFORE BED  6. PT TO BRING IN AVAPRO BOTTLE DAY OF SURGERY TO GIVE NURSE CORRECT DOSAGE -PT DID NOT HAVE BOTTLE AVAILABLE DURING PHONE INTERVIEW  ____Fleets enema or Magnesium Citrate as directed.   ____ Use CHG Soap or sage wipes as directed on instruction sheet   _X___ Use inhalers on the  day of surgery and bring to hospital day of surgery-USE YOUR PULMICORT NEBULIZER AND INHALER AND USE YOUR ALBUTEROL INHALER AND BRING ALBUTEROL INHALER TO HOSPITAL  ____ Stop Metformin and Janumet 2 days prior to surgery.    ____ Take 1/2 of usual insulin dose the night before surgery and none on the morning surgery.   _x___ Follow recommendations from Cardiologist, Pulmonologist or PCP regarding stopping Aspirin, Coumadin, Plavix ,Eliquis, Effient, or Pradaxa, and Pletal-CONTINUE ASA 81 MG-DO NOT TAKE AM OF SURGERY  X____Stop Anti-inflammatories such as Advil, Aleve, Ibuprofen, Motrin, Naproxen, Naprosyn, Goodies powders or aspirin products NOW-OK to take Tylenol   ____ Stop supplements until after surgery.      ____ Bring C-Pap to the hospital.

## 2018-12-13 NOTE — Pre-Procedure Instructions (Signed)
Wellington Hampshire, MD  Physician  Cardiology  Progress Notes  Signed  Encounter Date:  07/07/2017          Signed      Expand All Collapse All    Show:Clear all [x] Manual[x] Template[x] Copied  Added by: [x] Wellington Hampshire, MD  [] Hover for details     Cardiology Office Note   Date:  07/07/2017   ID:  WILMER SANTILLO, DOB [birth year], MRN [id number]  PCP:  Glendon Axe, MD      Cardiologist:   Kathlyn Sacramento, MD       Chief Complaint  Patient presents with  . OTHER    6 month f/u c/o congestion.  Meds reviewed verbally with pt.      History of Present Illness: Daryl Weeks is a 58 y.o. male who presents for a followup visit regarding coronary artery disease.  He is status post CABG in March 2015 after he presented with non-ST elevation myocardial infarction in the setting of severe anemia due to GI bleed.  He has known history of end-stage renal disease currently on dialysis and he is on the transplant list at [facility]. Other medical conditions include chronic diastolic heart failure, hypertension, hyperlipidemia and previous tobacco and alcohol use.  Cardiac workup was done in March 2016 in preparation for kidney transplant. Nuclear stress test was normal. Echocardiogram showed normal LV systolic function with mildly dilated left atrium.  He was taken off the kidney transplant list due to continued tobacco use.  He continues to smoke half a pack per day.  He drinks occasional beer but he denies excessive use.  He had recent congestion with associated chest pain.  No worsening shortness of breath.      Past Medical History:  Diagnosis Date  . Anemia   . Arthritis   . CHF (congestive heart failure) ([place])   . Colon polyps   . COPD (chronic obstructive pulmonary disease) ([place])   . Coronary artery disease    Non-ST elevation myocardial infarction in February of 2015. In the setting of hypertensive urgency and blood loss anemia. Cardiac catheterization  showed significant three-vessel coronary artery disease. EF was 55% by echo. He underwent CABG at [facility] on May 19 with LIMA to LAD, Sequential SVG to OM1 and OM2, and SVG to RCA  . End stage renal disease ([place])    m-w-f   -davida [place]  . ETOH abuse   . GERD (gastroesophageal reflux disease)    hx  . Hyperlipidemia   . Hypertension   . IgA nephropathy   . Lower GI bleed    Due to colon polyps. Status post resection of 14 polyps  . Non-ST elevation MI (NSTEMI) ([place])   . Pneumonia    hx  . Shortness of breath   . Tobacco abuse   . Tobacco abuse     Past Surgical History:  Procedure Laterality Date  . A/V FISTULAGRAM Left 10/28/2016   Procedure: A/V Fistulagram;  Surgeon: [name], MD;  Location: [facility];  Service: Cardiovascular;  Laterality: Left;  . A/V SHUNT INTERVENTION N/A 10/28/2016   Procedure: A/V Shunt Intervention;  Surgeon: [name], MD;  Location: [facility];  Service: Cardiovascular;  Laterality: N/A;  . AV FISTULA PLACEMENT    . CARDIAC CATHETERIZATION     RCA 90% and calcified mid LAD 80% Stenosis  . CORONARY ARTERY BYPASS GRAFT N/A 11/29/2013   Procedure: CORONARY ARTERY BYPASS GRAFTING (CABG) x 4 using endoscopically  harvested right saphenous vein and left internal mammary artery;  Surgeon: [name], MD;  Location: [facility] [place] OR;  Service: Open Heart Surgery;  Laterality: N/A;  . dialysis catheterr  2/15  . INTRAOPERATIVE TRANSESOPHAGEAL ECHOCARDIOGRAM N/A 11/29/2013   Procedure: INTRAOPERATIVE TRANSESOPHAGEAL ECHOCARDIOGRAM;  Surgeon: [name], MD;  Location: [facility] OR;  Service: Open Heart Surgery;  Laterality: N/A;  . PERIPHERAL VASCULAR CATHETERIZATION N/A 06/12/2015   Procedure: A/V Shuntogram/Fistulagram;  Surgeon: [name], MD;  Location: [facility];  Service: Cardiovascular;  Laterality: N/A;  . PERIPHERAL VASCULAR CATHETERIZATION Left 06/12/2015   Procedure: A/V Shunt Intervention;   Surgeon: [name], MD;  Location: [facility];  Service: Cardiovascular;  Laterality: Left;  . RENAL BIOPSY Left 14           Current Outpatient Prescriptions  Medication Sig Dispense Refill  . albuterol (VENTOLIN HFA) 108 (90 Base) MCG/ACT inhaler Inhale 2 puffs into the lungs every 6 (six) hours as needed for wheezing or shortness of breath.    [name] amLODipine (NORVASC) 10 MG tablet take 1 tablet by mouth once daily 30 tablet 5  . aspirin EC 81 MG tablet Take 1 tablet (81 mg total) by mouth daily. 90 tablet 3  . atorvastatin (LIPITOR) 20 MG tablet take 1 tablet by mouth at bedtime 30 tablet 6  . budesonide (PULMICORT) 0.5 MG/2ML nebulizer solution Take 2 mLs (0.5 mg total) by nebulization every 12 (twelve) hours. 60 mL 11  . cinacalcet (SENSIPAR) 30 MG tablet Take 30 mg by mouth daily.    . furosemide (LASIX) 80 MG tablet Take 80 mg by mouth daily.    . metoprolol (LOPRESSOR) 50 MG tablet take 1 tablet by mouth twice a day 60 tablet 6  . tiotropium (SPIRIVA) 18 MCG inhalation capsule Place 1 capsule (18 mcg total) into inhaler and inhale daily. 30 capsule 12  . traMADol (ULTRAM) 50 MG tablet Take by mouth.    . traZODone (DESYREL) 50 MG tablet Take 25 mg by mouth See admin instructions. Med is taking on nights pt does not have to be up for dialysis Monday Wed  Friday and Sat nights    . zolpidem (AMBIEN) 10 MG tablet Take 10 mg by mouth at bedtime as needed for sleep.              Current Facility-Administered Medications  Medication Dose Route Frequency Provider Last Rate Last Dose  . ceFAZolin (ANCEF) IVPB 1 g/50 mL premix  1 g Intravenous Once Dew, Erskine Squibb, MD        Allergies:   Patient has no known allergies.    Social History:  The patient  reports that he has been smoking Cigarettes.  He has a 15.00 pack-year smoking history. He has never used smokeless tobacco. He reports that he drinks about 1.2 oz of alcohol per week . He reports that he does not  use drugs.   Family History:  The patient's family history includes Heart disease in his brother and father.    ROS:  Please see the history of present illness.   Otherwise, review of systems are positive for none.   All other systems are reviewed and negative.    PHYSICAL EXAM: VS:  BP 140/82 (BP Location: Right Arm, Patient Position: Sitting, Cuff Size: Normal)   Pulse 66   Ht 5\' 6"  (1.676 m)   Wt 156 lb 12 oz (71.1 kg)   BMI 25.30 kg/m  , BMI  Body mass index is 25.3 kg/m. GEN: Well nourished, well developed, in no acute distress  HEENT: normal  Neck: no JVD, carotid bruits, or masses Cardiac: RRR; no murmurs, rubs, or gallops,no edema  Respiratory:  clear to auscultation bilaterally, normal work of breathing GI: soft, nontender, nondistended, + BS MS: no deformity or atrophy  Skin: warm and dry, no rash Neuro:  Strength and sensation are intact Psych: euthymic mood, full affect   EKG:  EKG is ordered today. The ekg ordered today demonstrates normal sinus rhythm with PVCs. LVH with repolarization abnormalities   Recent Labs: No results found for requested labs within last 8760 hours.    Lipid Panel Labs(Brief)          Component Value Date/Time   CHOL 143 10/30/2013 0543   TRIG 62 10/30/2013 0543   HDL 65 (H) 10/30/2013 0543   VLDL 12 10/30/2013 0543   LDLCALC 66 10/30/2013 0543           Wt Readings from Last 3 Encounters:  07/07/17 156 lb 12 oz (71.1 kg)  06/28/17 160 lb (72.6 kg)  05/24/17 162 lb (73.5 kg)       No flowsheet data found.    ASSESSMENT AND PLAN:  1.  Coronary artery disease involving native coronary arteries without angina:  He is doing reasonably well with no anginal symptoms. Continue medical therapy.  2. Chronic diastolic heart failure: He appears to be euvolemic.  Fluid status is managed with dialysis.  3. Hyperlipidemia:Continue treatment with atorvastatin with a target LDL of less than 70.    4. Essential hypertension: Blood pressure is reasonably controlled on current medications.  5.  Tobacco use: I discussed with him the importance of smoking cessation.  I explained to him that he was taken off the kidney transplant list at [facility] due to this issue according to the documentation.  The patient reports being under stress.  His sister is also a heavy smoker which makes it harder for him to quit.  He is going to try on his own and if not successful, we can consider Chantix.  Disposition:   FU with me in 6 months  Signed,  Kathlyn Sacramento, MD  07/07/2017 10:37 AM    [facility]        Electronically signed by Wellington Hampshire, MD at 07/07/2017 10:50 AM     Office Visit on 07/07/2017       Detailed Report

## 2018-12-13 NOTE — Pre-Procedure Instructions (Signed)
Daryl Weeks  2D Echocardiogram without contrast  Order# [id number]  Ordering physician: Wellington Hampshire, MD Study date: 12/17/14  2D Echocardiogram without contrast (Accession [id number]) (Order [id number])  Echocardiography  Date: 12/17/2014 Department: [place] Released By: Sharyne Richters Authorizing: Wellington Hampshire, MD  2D Echocardiogram without contrast  Order #: [id number] Accession #: [id number]  Patient Info   Patient name: Daryl Weeks  MRN: [id number]  Age: 58 y.o.  Sex: male  Vitals   BP Height Weight BSA (Calculated - sq m)  110/82 5\' 7"  (1.702 m) 66.7 kg 1.78 sq meters  Result Notes for 2D Echocardiogram without contrast   Notes Recorded by Stana Bunting, RN on 12/23/2014 at 3:21 PM Attempted to contact pt.  No answer, vm box is full. Letter mailed. ------  Notes Recorded by Stana Bunting, RN on 12/19/2014 at 5:39 PM Attempted to contact pt.  No answer, vm box is full. ------  Notes Recorded by Stana Bunting, RN on 12/18/2014 at 3:51 PM Attempted to contact pt.  No answer, pt's vm box is full. ------  Notes Recorded by Wellington Hampshire, MD on 12/18/2014 at 3:37 PM Inform patient that echo was fine. Normal EF. Send a copy to Dr. Abigail Butts      Study Result   Result status: Final result              *[address]            [place], [place] [address]              [id number]  ------------------------------------------------------------------- Transthoracic Echocardiography  Patient:  Daryl, Weeks MR #:    [id number] Study Date: 12/17/2014 Gender:   M Age:    57 Height:   170.2 cm Weight:   66.7 kg BSA:    1.78 m^2 Pt. Status: Room:  ATTENDING  Default, Provider [id number] Candelaria Stagers, MD REFERRING  Kathlyn Sacramento, MD SONOGRAPHER [facility] PERFORMING  [facility],  [place]  cc:  ------------------------------------------------------------------- LV EF: 55% -  60%  ------------------------------------------------------------------- History:  PMH:  Dyspnea. Coronary artery disease. Congestive heart failure. Chronic obstructive pulmonary disease. Risk factors: Current tobacco use. Hypertension. Dyslipidemia. ESRD.  ------------------------------------------------------------------- Study Conclusions  - Left ventricle: The cavity size was normal. Systolic function was normal. The estimated ejection fraction was in the range of 55% to 60%. Wall motion was normal; there were no regional wall motion abnormalities. Left ventricular diastolic function parameters were normal. - Left atrium: The atrium was mildly dilated.  ------------------------------------------------------------------- Labs, prior tests, procedures, and surgery: Echocardiography (February 2015).   EF was 65%.  Coronary artery bypass grafting. Transthoracic echocardiography. M-mode, complete 2D, spectral Doppler, and color Doppler. Birthdate: Patient birthdate: 10-09-1960. Age: Patient is 58 yr old. Sex: Gender: male. BMI: 23 kg/m^2. Blood pressure:   110/82 Patient status: Outpatient. Study date: Study date: 12/17/2014. Study time: 08:41 AM.  -------------------------------------------------------------------  ------------------------------------------------------------------- Left ventricle: The cavity size was normal. Systolic function was normal. The estimated ejection fraction was in the range of 55% to 60%. Wall motion was normal; there were no regional wall motion abnormalities. The transmitral flow pattern was normal. The deceleration time of the early transmitral flow velocity was normal. The pulmonary vein flow pattern was normal. The tissue Doppler parameters were normal. Left ventricular diastolic function parameters were  normal.  ------------------------------------------------------------------- Aortic valve:  Trileaflet; normal thickness leaflets.  Mobility was not restricted. Doppler: Transvalvular velocity was within the normal range. There was no stenosis. There was no regurgitation.  ------------------------------------------------------------------- Aorta: Aortic root: The aortic root was normal in size.  ------------------------------------------------------------------- Mitral valve:  Structurally normal valve.  Mobility was not restricted. Doppler: Transvalvular velocity was within the normal range. There was no evidence for stenosis. There was trivial regurgitation.  Peak gradient (D): 4 mm Hg.  ------------------------------------------------------------------- Left atrium: The atrium was mildly dilated.  ------------------------------------------------------------------- Right ventricle: The cavity size was normal. Wall thickness was normal. Systolic function was normal.  ------------------------------------------------------------------- Pulmonic valve:  Doppler: Transvalvular velocity was within the normal range. There was no evidence for stenosis. There was trivial regurgitation.  ------------------------------------------------------------------- Tricuspid valve:  Structurally normal valve.  Doppler: Transvalvular velocity was within the normal range. There was trivial regurgitation.  ------------------------------------------------------------------- Pulmonary artery:  The main pulmonary artery was normal-sized. Systolic pressure was within the normal range.  ------------------------------------------------------------------- Right atrium: The atrium was normal in size.  ------------------------------------------------------------------- Pericardium: There was no pericardial  effusion.  ------------------------------------------------------------------- Systemic veins: Inferior vena cava: The vessel was normal in size.  ------------------------------------------------------------------- Measurements  Left ventricle               Value    Reference LV ID, ED, PLAX chordal       (L)   41.8 mm   43 - 52 LV ID, ES, PLAX chordal           27  mm   23 - 38 LV fx shortening, PLAX chordal       35  %   >=29 LV PW thickness, ED             8.29 mm   --------- IVS/LV PW ratio, ED             1.1     <=1.3 Stroke volume, 2D              106  ml   --------- Stroke volume/bsa, 2D            60  ml/m^2 --------- LV e&', lateral               12.5 cm/s  --------- LV E/e&', lateral              8.08     --------- LV e&', medial                8.6  cm/s  --------- LV E/e&', medial               11.74    --------- LV e&', average               10.55 cm/s  --------- LV E/e&', average              9.57     ---------  Ventricular septum             Value    Reference IVS thickness, ED              9.11 mm   ---------  LVOT                    Value    Reference LVOT ID, S                 22  mm   --------- LVOT area  3.8  cm^2  --------- LVOT ID                   22  mm   --------- LVOT peak velocity, S            120  cm/s  --------- LVOT mean velocity, S            77.5 cm/s  --------- LVOT VTI, S                 27.8 cm   --------- LVOT peak gradient, S            6   mm Hg --------- Stroke volume (SV), LVOT DP         105.7 ml    --------- Stroke index (SV/bsa), LVOT DP       59.4 ml/m^2 ---------  Aorta                    Value    Reference Aortic root ID, ED             39  mm   ---------  Left atrium                 Value    Reference LA ID, A-P, ES               47  mm   --------- LA ID/bsa, A-P           (H)   2.64 cm/m^2 <=2.2 LA volume, ES, 1-p A4C           62  ml   --------- LA volume/bsa, ES, 1-p A4C         34.8 ml/m^2 ---------  Mitral valve                Value    Reference Mitral E-wave peak velocity         101  cm/s  --------- Mitral A-wave peak velocity         60.7 cm/s  --------- Mitral deceleration time          173  ms   150 - 230 Mitral peak gradient, D           4   mm Hg --------- Mitral E/A ratio, peak           1.7     --------- Mitral maximal regurg velocity,       199  cm/s  --------- PISA  Pulmonary arteries             Value    Reference PA pressure, S, DP             20  mm Hg <=30  Tricuspid valve               Value    Reference Tricuspid regurg peak velocity       204  cm/s  --------- Tricuspid peak RV-RA gradient        17  mm Hg ---------  Systemic veins               Value    Reference Estimated CVP                3   mm Hg ---------  Right ventricle               Value    Reference RV pressure, S, DP  20  mm Hg <=30 RV s&', lateral, S              7.6  cm/s  ---------  Legend: (L) and (H) mark values outside specified reference range.  ------------------------------------------------------------------- Prepared and Electronically Authenticated  by  Kathlyn Sacramento, MD 2016-04-06T14:49:56  Performing Technologist/Nurse   Performing Technologist/Nurse:   Reason for Exam  Priority: Routine  Dx: Dyspnea [R06.00 (ICD-10-CM)]  Comments:      Surgical History   Surgical History   Procedure Laterality Date Comment Source  CARDIAC CATHETERIZATION   RCA 90% and calcified mid LAD 80% Stenosis Provider  CORONARY ARTERY BYPASS GRAFT N/A 11/29/2013 Procedure: CORONARY ARTERY BYPASS GRAFTING (CABG) x 4 using endoscopically harvested right saphenous vein and left internal mammary artery; Surgeon: [name], MD; Location: [facility]; Service: Open Heart Surgery; Laterality: N/A; Provider    Other Surgical History   Procedure Laterality Date Comment Source  A/V FISTULAGRAM Left 10/28/2016 Procedure: A/V Fistulagram; Surgeon: [name], MD; Location: [place] [facility]; Service: Cardiovascular; Laterality: Left; Provider  A/V FISTULAGRAM Left 12/07/2018 Procedure: A/V FISTULAGRAM; Surgeon: [name], MD; Location: [place] [facility]; Service: Cardiovascular; Laterality: Left; Provider  A/V SHUNT INTERVENTION N/A 10/28/2016 Procedure: A/V Shunt Intervention; Surgeon: [name], MD; Location: [place] [facility]; Service: Cardiovascular; Laterality: N/A; Provider  AV FISTULA PLACEMENT    Provider  dialysis catheterr  2/15  Provider  INTRAOPERATIVE TRANSESOPHAGEAL ECHOCARDIOGRAM N/A 11/29/2013 Procedure: INTRAOPERATIVE TRANSESOPHAGEAL ECHOCARDIOGRAM; Surgeon: [name], MD; Location: [facility] OR; Service: Open Heart Surgery; Laterality: N/A; Provider  PERIPHERAL VASCULAR CATHETERIZATION N/A 06/12/2015 Procedure: A/V Shuntogram/Fistulagram; Surgeon: [name], MD; Location: [facility] [facility]; Service: Cardiovascular; Laterality: N/A; Provider  PERIPHERAL VASCULAR CATHETERIZATION Left 06/12/2015 Procedure: A/V Shunt Intervention; Surgeon: [name], MD; Location: [place] [facility]; Service: Cardiovascular;  Laterality: Left; Provider  RENAL BIOPSY Left 14  Provider    Implants    No active implants to display in this view.  Order-Level Documents:   There are no order-level documents.  Encounter-Level Documents - 12/17/2014:   Electronic signature on 12/17/2014 8:17 AM - 1 of 2 e-signatures recorded

## 2018-12-14 ENCOUNTER — Ambulatory Visit: Payer: Medicare Other | Admitting: Anesthesiology

## 2018-12-14 ENCOUNTER — Encounter
Admission: RE | Disposition: A | Discharge status: Home / Self Care | Payer: Self-pay | Source: Home / Self Care | Attending: Vascular Surgery

## 2018-12-14 ENCOUNTER — Ambulatory Visit: Payer: Medicare Other

## 2018-12-14 ENCOUNTER — Other Ambulatory Visit: Payer: Self-pay

## 2018-12-14 ENCOUNTER — Ambulatory Visit
Admission: RE | Admit: 2018-12-14 | Discharge: 2018-12-14 | Disposition: A | Discharge status: Home / Self Care | Payer: Medicare Other | Attending: Vascular Surgery | Admitting: Vascular Surgery

## 2018-12-14 DIAGNOSIS — J449 Chronic obstructive pulmonary disease, unspecified: Secondary | ICD-10-CM | POA: Diagnosis not present

## 2018-12-14 DIAGNOSIS — Z01810 Encounter for preprocedural cardiovascular examination: Secondary | ICD-10-CM | POA: Diagnosis not present

## 2018-12-14 DIAGNOSIS — M069 Rheumatoid arthritis, unspecified: Secondary | ICD-10-CM | POA: Insufficient documentation

## 2018-12-14 DIAGNOSIS — X58XXXA Exposure to other specified factors, initial encounter: Secondary | ICD-10-CM | POA: Diagnosis not present

## 2018-12-14 DIAGNOSIS — T82898A Other specified complication of vascular prosthetic devices, implants and grafts, initial encounter: Secondary | ICD-10-CM | POA: Diagnosis not present

## 2018-12-14 DIAGNOSIS — D631 Anemia in chronic kidney disease: Secondary | ICD-10-CM | POA: Insufficient documentation

## 2018-12-14 DIAGNOSIS — E785 Hyperlipidemia, unspecified: Secondary | ICD-10-CM | POA: Diagnosis not present

## 2018-12-14 DIAGNOSIS — Z8249 Family history of ischemic heart disease and other diseases of the circulatory system: Secondary | ICD-10-CM | POA: Insufficient documentation

## 2018-12-14 DIAGNOSIS — I2510 Atherosclerotic heart disease of native coronary artery without angina pectoris: Secondary | ICD-10-CM | POA: Insufficient documentation

## 2018-12-14 DIAGNOSIS — Z86010 Personal history of colonic polyps: Secondary | ICD-10-CM | POA: Diagnosis not present

## 2018-12-14 DIAGNOSIS — F17210 Nicotine dependence, cigarettes, uncomplicated: Secondary | ICD-10-CM | POA: Insufficient documentation

## 2018-12-14 DIAGNOSIS — Z992 Dependence on renal dialysis: Secondary | ICD-10-CM | POA: Insufficient documentation

## 2018-12-14 DIAGNOSIS — F419 Anxiety disorder, unspecified: Secondary | ICD-10-CM | POA: Diagnosis not present

## 2018-12-14 DIAGNOSIS — N186 End stage renal disease: Secondary | ICD-10-CM | POA: Insufficient documentation

## 2018-12-14 DIAGNOSIS — I5032 Chronic diastolic (congestive) heart failure: Secondary | ICD-10-CM | POA: Diagnosis not present

## 2018-12-14 DIAGNOSIS — I509 Heart failure, unspecified: Secondary | ICD-10-CM | POA: Insufficient documentation

## 2018-12-14 DIAGNOSIS — G4730 Sleep apnea, unspecified: Secondary | ICD-10-CM | POA: Diagnosis not present

## 2018-12-14 DIAGNOSIS — I252 Old myocardial infarction: Secondary | ICD-10-CM | POA: Insufficient documentation

## 2018-12-14 DIAGNOSIS — Z951 Presence of aortocoronary bypass graft: Secondary | ICD-10-CM | POA: Diagnosis not present

## 2018-12-14 DIAGNOSIS — I132 Hypertensive heart and chronic kidney disease with heart failure and with stage 5 chronic kidney disease, or end stage renal disease: Secondary | ICD-10-CM | POA: Diagnosis not present

## 2018-12-14 HISTORY — PX: REVISON OF ARTERIOVENOUS FISTULA: SHX6074

## 2018-12-14 HISTORY — PX: INSERTION OF DIALYSIS CATHETER: SHX1324

## 2018-12-14 LAB — BASIC METABOLIC PANEL
Anion gap: 10 (ref 5–15)
BUN: 22 mg/dL — ABNORMAL HIGH (ref 6–20)
CO2: 28 mmol/L (ref 22–32)
Calcium: 8.6 mg/dL — ABNORMAL LOW (ref 8.9–10.3)
Chloride: 94 mmol/L — ABNORMAL LOW (ref 98–111)
Creatinine, Ser: 7.76 mg/dL — ABNORMAL HIGH (ref 0.61–1.24)
GFR calc Af Amer: 8 mL/min — ABNORMAL LOW (ref >60)
GFR calc non Af Amer: 7 mL/min — ABNORMAL LOW (ref >60)
Glucose, Bld: 109 mg/dL — ABNORMAL HIGH (ref 70–99)
Potassium: 4.6 mmol/L (ref 3.5–5.1)
Sodium: 132 mmol/L — ABNORMAL LOW (ref 135–145)

## 2018-12-14 LAB — CBC WITH DIFFERENTIAL/PLATELET
Abs Immature Granulocytes: 0.01 10*3/uL (ref 0.00–0.07)
Basophils Absolute: 0 10*3/uL (ref 0.0–0.1)
Basophils Relative: 1 %
Eosinophils Absolute: 0.1 10*3/uL (ref 0.0–0.5)
Eosinophils Relative: 3 %
HCT: 32.2 % — ABNORMAL LOW (ref 39.0–52.0)
Hemoglobin: 10.6 g/dL — ABNORMAL LOW (ref 13.0–17.0)
Immature Granulocytes: 0 %
Lymphocytes Relative: 27 %
Lymphs Abs: 1.4 10*3/uL (ref 0.7–4.0)
MCH: 34.3 pg — ABNORMAL HIGH (ref 26.0–34.0)
MCHC: 32.9 g/dL (ref 30.0–36.0)
MCV: 104.2 fL — ABNORMAL HIGH (ref 80.0–100.0)
Monocytes Absolute: 1 10*3/uL (ref 0.1–1.0)
Monocytes Relative: 18 %
Neutro Abs: 2.7 10*3/uL (ref 1.7–7.7)
Neutrophils Relative %: 51 %
Platelets: 203 10*3/uL (ref 150–400)
RBC: 3.09 MIL/uL — ABNORMAL LOW (ref 4.22–5.81)
RDW: 12.6 % (ref 11.5–15.5)
WBC: 5.3 10*3/uL (ref 4.0–10.5)
nRBC: 0 % (ref 0.0–0.2)

## 2018-12-14 LAB — TYPE AND SCREEN
ABO/RH(D): A POS
Antibody Screen: NEGATIVE

## 2018-12-14 LAB — APTT: aPTT: 33 seconds (ref 24–36)

## 2018-12-14 LAB — PROTIME-INR
INR: 1 (ref 0.8–1.2)
Prothrombin Time: 13.4 seconds (ref 11.4–15.2)

## 2018-12-14 SURGERY — REVISON OF ARTERIOVENOUS FISTULA
Anesthesia: General

## 2018-12-14 MED ORDER — FENTANYL CITRATE (PF) 100 MCG/2ML IJ SOLN
INTRAMUSCULAR | Status: AC
  Administered 2018-12-14: 16:00:00 25 ug via INTRAVENOUS
  Filled 2018-12-14: qty 2

## 2018-12-14 MED ORDER — ONDANSETRON HCL 4 MG/2ML IJ SOLN
INTRAMUSCULAR | Status: DC | PRN
  Administered 2018-12-14: 4 mg via INTRAVENOUS

## 2018-12-14 MED ORDER — BUPIVACAINE HCL (PF) 0.5 % IJ SOLN
INTRAMUSCULAR | Status: AC
  Filled 2018-12-14: qty 30

## 2018-12-14 MED ORDER — PROPOFOL 10 MG/ML IV BOLUS
INTRAVENOUS | Status: AC
  Filled 2018-12-14: qty 20

## 2018-12-14 MED ORDER — LIDOCAINE HCL (PF) 2 % IJ SOLN
INTRAMUSCULAR | Status: AC
  Filled 2018-12-14: qty 10

## 2018-12-14 MED ORDER — HEPARIN SODIUM (PORCINE) 5000 UNIT/ML IJ SOLN
INTRAMUSCULAR | Status: AC
  Filled 2018-12-14: qty 1

## 2018-12-14 MED ORDER — CHLORHEXIDINE GLUCONATE CLOTH 2 % EX PADS: 6.0000 | MEDICATED_PAD | Freq: Once | CUTANEOUS | Status: DC

## 2018-12-14 MED ORDER — OXYCODONE HCL 5 MG PO TABS: 5.0000 mg | ORAL_TABLET | Freq: Once | ORAL | Status: DC | PRN

## 2018-12-14 MED ORDER — ONDANSETRON HCL 4 MG/2ML IJ SOLN: 4.0000 mg | Freq: Four times a day (QID) | INTRAMUSCULAR | Status: DC | PRN

## 2018-12-14 MED ORDER — PAPAVERINE HCL 30 MG/ML IJ SOLN
INTRAMUSCULAR | Status: AC
  Filled 2018-12-14: qty 2

## 2018-12-14 MED ORDER — EPHEDRINE SULFATE 50 MG/ML IJ SOLN
INTRAMUSCULAR | Status: DC | PRN
  Administered 2018-12-14 (×2): 10 mg via INTRAVENOUS

## 2018-12-14 MED ORDER — HEPARIN SODIUM (PORCINE) 1000 UNIT/ML IJ SOLN
INTRAMUSCULAR | Status: DC | PRN
  Administered 2018-12-14: 3000 [IU] via INTRAVENOUS

## 2018-12-14 MED ORDER — FENTANYL CITRATE (PF) 100 MCG/2ML IJ SOLN
INTRAMUSCULAR | Status: AC
  Filled 2018-12-14: qty 2

## 2018-12-14 MED ORDER — OXYCODONE HCL 5 MG/5ML PO SOLN: 5.0000 mg | Freq: Once | ORAL | Status: DC | PRN

## 2018-12-14 MED ORDER — HYDROCODONE-ACETAMINOPHEN 5-325 MG PO TABS: 1.0000 | ORAL_TABLET | Freq: Four times a day (QID) | ORAL | 0 refills | Status: DC | PRN

## 2018-12-14 MED ORDER — PROPOFOL 10 MG/ML IV BOLUS
INTRAVENOUS | Status: DC | PRN
  Administered 2018-12-14: 150 mg via INTRAVENOUS
  Administered 2018-12-14: 30 mg via INTRAVENOUS
  Administered 2018-12-14: 10 mg via INTRAVENOUS
  Administered 2018-12-14: 30 mg via INTRAVENOUS

## 2018-12-14 MED ORDER — EVICEL 2 ML EX KIT
PACK | CUTANEOUS | Status: DC | PRN
  Administered 2018-12-14: 1

## 2018-12-14 MED ORDER — LIDOCAINE HCL (CARDIAC) PF 100 MG/5ML IV SOSY
PREFILLED_SYRINGE | INTRAVENOUS | Status: DC | PRN
  Administered 2018-12-14: 40 mg via INTRAVENOUS

## 2018-12-14 MED ORDER — CEFAZOLIN SODIUM-DEXTROSE 1-4 GM/50ML-% IV SOLN
INTRAVENOUS | Status: AC
  Filled 2018-12-14: qty 50

## 2018-12-14 MED ORDER — EVICEL 2 ML EX KIT
PACK | CUTANEOUS | Status: AC
  Filled 2018-12-14: qty 1

## 2018-12-14 MED ORDER — FENTANYL CITRATE (PF) 100 MCG/2ML IJ SOLN
25.0000 ug | INTRAMUSCULAR | Status: DC | PRN
  Administered 2018-12-14: 16:00:00 25 ug via INTRAVENOUS

## 2018-12-14 MED ORDER — LIDOCAINE HCL URETHRAL/MUCOSAL 2 % EX GEL
CUTANEOUS | Status: AC
  Filled 2018-12-14: qty 5

## 2018-12-14 MED ORDER — HYDROMORPHONE HCL 1 MG/ML IJ SOLN: 1.0000 mg | Freq: Once | INTRAMUSCULAR | Status: DC | PRN

## 2018-12-14 MED ORDER — MIDAZOLAM HCL 2 MG/2ML IJ SOLN
INTRAMUSCULAR | Status: AC
  Filled 2018-12-14: qty 2

## 2018-12-14 MED ORDER — EPINEPHRINE PF 1 MG/ML IJ SOLN
INTRAMUSCULAR | Status: AC
  Filled 2018-12-14: qty 1

## 2018-12-14 MED ORDER — BUPIVACAINE-EPINEPHRINE (PF) 0.5% -1:200000 IJ SOLN
INTRAMUSCULAR | Status: AC
  Filled 2018-12-14: qty 30

## 2018-12-14 MED ORDER — HYDROCODONE-ACETAMINOPHEN 5-325 MG PO TABS: 1.0000 | ORAL_TABLET | Freq: Four times a day (QID) | ORAL | Status: DC | PRN

## 2018-12-14 MED ORDER — MIDAZOLAM HCL 2 MG/2ML IJ SOLN
INTRAMUSCULAR | Status: DC | PRN
  Administered 2018-12-14: 1 mg via INTRAVENOUS

## 2018-12-14 MED ORDER — SODIUM CHLORIDE 0.9 % IV SOLN
INTRAVENOUS | Status: DC
  Administered 2018-12-14 (×2): via INTRAVENOUS

## 2018-12-14 MED ORDER — PROMETHAZINE HCL 25 MG/ML IJ SOLN: 6.2500 mg | INTRAMUSCULAR | Status: DC | PRN

## 2018-12-14 MED ORDER — CHLORHEXIDINE GLUCONATE CLOTH 2 % EX PADS
6.0000 | MEDICATED_PAD | Freq: Once | CUTANEOUS | Status: AC
  Administered 2018-12-14: 6 via TOPICAL

## 2018-12-14 MED ORDER — MEPERIDINE HCL 50 MG/ML IJ SOLN: 6.2500 mg | INTRAMUSCULAR | Status: DC | PRN

## 2018-12-14 MED ORDER — FENTANYL CITRATE (PF) 100 MCG/2ML IJ SOLN
INTRAMUSCULAR | Status: DC | PRN
  Administered 2018-12-14 (×3): 50 ug via INTRAVENOUS

## 2018-12-14 SURGICAL SUPPLY — 53 items
BAG DECANTER FOR FLEXI CONT (MISCELLANEOUS) ×4 IMPLANT
BLADE SURG SZ11 CARB STEEL (BLADE) ×4 IMPLANT
BOOT SUTURE AID YELLOW STND (SUTURE) ×4 IMPLANT
BRUSH SCRUB EZ  4% CHG (MISCELLANEOUS) ×2
BRUSH SCRUB EZ 4% CHG (MISCELLANEOUS) ×2 IMPLANT
CANISTER SUCT 1200ML W/VALVE (MISCELLANEOUS) ×4 IMPLANT
CHLORAPREP W/TINT 26 (MISCELLANEOUS) ×4 IMPLANT
CLIP SPRNG 6MM S-JAW DBL (CLIP) ×4
COVER WAND RF STERILE (DRAPES) ×4 IMPLANT
DERMABOND ADVANCED (GAUZE/BANDAGES/DRESSINGS) ×2
DERMABOND ADVANCED .7 DNX12 (GAUZE/BANDAGES/DRESSINGS) ×2 IMPLANT
DRAPE C-ARM XRAY 36X54 (DRAPES) ×4 IMPLANT
ELECT CAUTERY BLADE 6.4 (BLADE) ×4 IMPLANT
ELECT REM PT RETURN 9FT ADLT (ELECTROSURGICAL) ×4
ELECTRODE REM PT RTRN 9FT ADLT (ELECTROSURGICAL) ×2 IMPLANT
GEL ULTRASOUND 20GR AQUASONIC (MISCELLANEOUS) IMPLANT
GLOVE BIO SURGEON STRL SZ7 (GLOVE) ×8 IMPLANT
GLOVE INDICATOR 7.5 STRL GRN (GLOVE) ×4 IMPLANT
GOWN STRL REUS W/ TWL LRG LVL3 (GOWN DISPOSABLE) ×4 IMPLANT
GOWN STRL REUS W/ TWL XL LVL3 (GOWN DISPOSABLE) ×2 IMPLANT
GOWN STRL REUS W/TWL LRG LVL3 (GOWN DISPOSABLE) ×4
GOWN STRL REUS W/TWL XL LVL3 (GOWN DISPOSABLE) ×2
GRAFT COLLAGEN VASCULAR 8X40 (Vascular Products) ×4 IMPLANT
HEMOSTAT SURGICEL 2X3 (HEMOSTASIS) ×4 IMPLANT
IV NS 500ML (IV SOLUTION) ×2
IV NS 500ML BAXH (IV SOLUTION) ×2 IMPLANT
KIT TURNOVER KIT A (KITS) ×4 IMPLANT
LABEL OR SOLS (LABEL) ×4 IMPLANT
LOOP RED MAXI  1X406MM (MISCELLANEOUS) ×2
LOOP VESSEL MAXI 1X406 RED (MISCELLANEOUS) ×2 IMPLANT
LOOP VESSEL MINI 0.8X406 BLUE (MISCELLANEOUS) ×2 IMPLANT
LOOPS BLUE MINI 0.8X406MM (MISCELLANEOUS) ×2
NEEDLE FILTER BLUNT 18X 1/2SAF (NEEDLE) ×2
NEEDLE FILTER BLUNT 18X1 1/2 (NEEDLE) ×2 IMPLANT
NS IRRIG 500ML POUR BTL (IV SOLUTION) ×4 IMPLANT
PACK EXTREMITY ARMC (MISCELLANEOUS) ×4 IMPLANT
PAD PREP 24X41 OB/GYN DISP (PERSONAL CARE ITEMS) ×4 IMPLANT
SOLUTION CELL SAVER (CLIP) ×2 IMPLANT
STOCKINETTE STRL 4IN 9604848 (GAUZE/BANDAGES/DRESSINGS) ×4 IMPLANT
SUT MNCRL AB 4-0 PS2 18 (SUTURE) ×4 IMPLANT
SUT PROLENE 6 0 BV (SUTURE) ×16 IMPLANT
SUT SILK 2 0 (SUTURE)
SUT SILK 2 0 SH (SUTURE) ×4 IMPLANT
SUT SILK 2-0 18XBRD TIE 12 (SUTURE) IMPLANT
SUT SILK 3 0 (SUTURE) ×2
SUT SILK 3-0 18XBRD TIE 12 (SUTURE) ×2 IMPLANT
SUT SILK 4 0 (SUTURE) ×2
SUT SILK 4-0 18XBRD TIE 12 (SUTURE) ×2 IMPLANT
SUT VIC AB 3-0 SH 27 (SUTURE) ×2
SUT VIC AB 3-0 SH 27X BRD (SUTURE) ×2 IMPLANT
SYR 20CC LL (SYRINGE) ×4 IMPLANT
SYR 3ML LL SCALE MARK (SYRINGE) ×4 IMPLANT
SYR TB 1ML 27GX1/2 LL (SYRINGE) IMPLANT

## 2018-12-14 NOTE — Anesthesia Preprocedure Evaluation (Signed)
Anesthesia Evaluation  Patient identified by MRN, date of birth, ID band Patient awake    Reviewed: Allergy & Precautions, NPO status , Patient's Chart, lab work & pertinent test results  History of Anesthesia Complications Negative for: history of anesthetic complications  Airway Mallampati: II  TM Distance: >3 FB Neck ROM: Full    Dental  (+) Poor Dentition, Missing   Pulmonary COPD,  COPD inhaler, Current Smoker,    breath sounds clear to auscultation- rhonchi (-) wheezing      Cardiovascular hypertension, Pt. on medications (-) angina+ CAD, + Past MI, + CABG (2015) and +CHF (preserved EF)  (-) Cardiac Stents  Rhythm:Regular Rate:Normal - Systolic murmurs and - Diastolic murmurs    Neuro/Psych neg Seizures PSYCHIATRIC DISORDERS Anxiety negative neurological ROS     GI/Hepatic Neg liver ROS, GERD  ,  Endo/Other  negative endocrine ROSneg diabetes  Renal/GU ESRF and DialysisRenal disease     Musculoskeletal  (+) Arthritis ,   Abdominal (+) - obese,   Peds  Hematology  (+) anemia ,   Anesthesia Other Findings Past Medical History: No date: Anemia No date: Anxiety No date: Arthritis No date: CHF (congestive heart failure) (HCC) No date: Chronic kidney disease on chronic dialysis (HCC)     Comment:  dialysis on m, w, f No date: Colon polyps No date: COPD (chronic obstructive pulmonary disease) (HCC) No date: Coronary artery disease     Comment:  Non-ST elevation myocardial infarction in February of               2015. In the setting of hypertensive urgency and blood               loss anemia. Cardiac catheterization showed significant               three-vessel coronary artery disease. EF was 55% by echo.              He underwent CABG at [facility] on May 19 with LIMA to LAD,               Sequential SVG to OM1 and OM2, and SVG to RCA No date: End stage renal disease (HCC)     Comment:  m-w-f   -[place]  [place] No date: End stage renal disease (HCC) No date: ETOH abuse No date: GERD (gastroesophageal reflux disease)     Comment:  hx No date: Hyperlipidemia No date: Hypertension No date: IgA nephropathy No date: Lower GI bleed     Comment:  Due to colon polyps. Status post resection of 14 polyps No date: Non-ST elevation MI (NSTEMI) ([place]) No date: Pneumonia     Comment:  hx No date: Rheumatoid arthritis ([place]) No date: Shortness of breath No date: Sleep apnea No date: Tobacco abuse No date: Tobacco abuse   Reproductive/Obstetrics                             Anesthesia Physical Anesthesia Plan  ASA: IV  Anesthesia Plan: General   Post-op Pain Management:    Induction: Intravenous  PONV Risk Score and Plan: 0 and Ondansetron  Airway Management Planned: LMA  Additional Equipment:   Intra-op Plan:   Post-operative Plan:   Informed Consent: I have reviewed the patients History and Physical, chart, labs and discussed the procedure including the risks, benefits and alternatives for the proposed anesthesia with the patient or authorized representative who has indicated his/her understanding and  acceptance.     Dental advisory given  Plan Discussed with: CRNA and Anesthesiologist  Anesthesia Plan Comments:         Anesthesia Quick Evaluation

## 2018-12-14 NOTE — H&P (Signed)
[facility] History & Physical Update  The patient was interviewed and re-examined.  The patient's previous History and Physical has been reviewed and is unchanged.  There is no change in the plan of care. We plan to proceed with the scheduled procedure.  Leotis Pain, MD  12/14/2018, 12:26 PM

## 2018-12-14 NOTE — Transfer of Care (Signed)
Immediate Anesthesia Transfer of Care Note  Patient: Daryl Weeks  Procedure(s) Performed: REVISON OF ARTERIOVENOUS FISTULA (Left ) INSERTION OF DIALYSIS CATHETER ( PERMCATH ) (N/A )  Patient Location: PACU  Anesthesia Type:General  Level of Consciousness: drowsy  Airway & Oxygen Therapy: Patient connected to face mask oxygen  Post-op Assessment: Post -op Vital signs reviewed and stable  Post vital signs: stable  Last Vitals:  Vitals Value Taken Time  BP 165/83 12/14/2018  3:23 PM  Temp    Pulse 73 12/14/2018  3:23 PM  Resp    SpO2      Last Pain:  Vitals:   12/14/18 1040  TempSrc: Oral         Complications: No apparent anesthesia complications

## 2018-12-14 NOTE — Anesthesia Procedure Notes (Signed)
Procedure Name: LMA Insertion Date/Time: 12/14/2018 1:25 PM Performed by: Allean Found, CRNA Pre-anesthesia Checklist: Patient identified, Patient being monitored, Timeout performed, Emergency Drugs available and Suction available Patient Re-evaluated:Patient Re-evaluated prior to induction Oxygen Delivery Method: Circle system utilized Preoxygenation: Pre-oxygenation with 100% oxygen Induction Type: IV induction Ventilation: Mask ventilation without difficulty LMA: LMA inserted LMA Size: 5.0 Tube type: Oral Number of attempts: 1 Placement Confirmation: positive ETCO2 and breath sounds checked- equal and bilateral Tube secured with: Tape Dental Injury: Teeth and Oropharynx as per pre-operative assessment

## 2018-12-14 NOTE — Progress Notes (Signed)
Contacted Dr. Randa Lynn who is aware of lab results.

## 2018-12-14 NOTE — Op Note (Signed)
OPERATIVE NOTE    PRE-OPERATIVE DIAGNOSIS: 1. ESRD 2. Surgical revision of aneurysmal AVF that will need time to heal before use  POST-OPERATIVE DIAGNOSIS: same as above  PROCEDURE: 1. Ultrasound guidance for vascular access to the right internal jugular vein 2. Fluoroscopic guidance for placement of catheter 3. Placement of a 19 cm tip to cuff tunneled hemodialysis catheter via the right internal jugular vein  SURGEON: Leotis Pain, MD  ANESTHESIA:  general  ESTIMATED BLOOD LOSS: 5 cc  FLUORO TIME: less than one minute  CONTRAST: none  FINDING(S): 1.  Patent right internal jugular vein  SPECIMEN(S):  None  INDICATIONS:   Daryl Weeks is a 58 y.o.male who presents with renal failure and need for surgical revision of an aneurysmal left arm AV fistula.  This will need time to heal before it can be used.  The patient needs long term dialysis access for their ESRD, and a Permcath is necessary.  Risks and benefits are discussed and informed consent is obtained.    DESCRIPTION: After obtaining full informed written consent, the patient was brought back to the vascular suited. The patient's right neck and chest were sterilely prepped and draped in a sterile surgical field was created.  This was done after his AV fistula has been surgically revised.  He remained under general anesthesia.  The right internal jugular vein was visualized with ultrasound and found to be patent. It was then accessed under direct ultrasound guidance and a permanent image was recorded. A wire was placed. After skin nick and dilatation, the peel-away sheath was placed over the wire. I then turned my attention to an area under the clavicle. Approximately 1-2 fingerbreadths below the clavicle a small counterincision was created and tunneled from the subclavicular incision to the access site. Using fluoroscopic guidance, a teen centimeter tip to cuff tunneled hemodialysis catheter was selected, and tunneled from the  subclavicular incision to the access site. It was then placed through the peel-away sheath and the peel-away sheath was removed. Using fluoroscopic guidance the catheter tips were parked in the right atrium. The appropriate distal connectors were placed. It withdrew blood well and flushed easily with heparinized saline and a concentrated heparin solution was then placed. It was secured to the chest wall with 2 Prolene sutures. The access incision was closed single 4-0 Monocryl. A 4-0 Monocryl pursestring suture was placed around the exit site. Sterile dressings were placed. The patient tolerated the procedure well and was taken to the recovery room in stable condition.  COMPLICATIONS: None  CONDITION: Stable  Leotis Pain, MD 12/14/2018 3:12 PM   This note was created with Dragon Medical transcription system. Any errors in dictation are purely unintentional.

## 2018-12-14 NOTE — Anesthesia Post-op Follow-up Note (Signed)
Anesthesia QCDR form completed.        

## 2018-12-14 NOTE — Op Note (Signed)
[facility]   OPERATIVE NOTE   PROCEDURE: 1. Jump graft revision left radial basilic arteriovenous fistula with 7 mm Artegraft 2. Ligation of left radial basilic AV fistula  PRE-OPERATIVE DIAGNOSIS: 1. aneurysmal degeneration of arteriovenous fistula  2. ESRD  POST-OPERATIVE DIAGNOSIS: same as above   SURGEON: Leotis Pain, MD  ASSISTANT(S): Hezzie Bump, PA-C  ANESTHESIA: General  ESTIMATED BLOOD LOSS: 50 cc  FINDING(S): 1. Left radial basilic AV fistula aneurysm 2.   palpable thrill at end of the case  SPECIMEN(S): None  INDICATIONS:   [name] is a 58 y.o. male who  presents with aneurysmal degeneration of left arm arteriovenous access.  In order to salvage the fistula and decrease the bleeding complication risks, I recommended a jump graft revision with ligation of the access.  Risk, benefits, and alternatives to access surgery were discussed.  The patient is aware the risks include but are not limited to: bleeding, infection, steal syndrome, nerve damage, ischemic monomelic neuropathy, loss of the access, need for additional procedures, death and stroke.  The patient agrees to proceed forward with the procedure. An assistant was present during the procedure to help facilitate the exposure and expedite the procedure.  DESCRIPTION: After obtaining full informed written consent, the patient was brought back to the operating room and placed supine upon the operating table.  The patient received IV antibiotics prior to induction.  After obtaining adequate anesthesia, the patient was prepped and draped in the standard fashion for the access procedure. The assistant provided retraction and mobilization to help facilitate exposure and expedite the procedure throughout the entire procedure.  This included following suture, using retractors, and optimizing lighting.  As incision was created near the arterial anastomosis prior to the aneurysmal segment.  The  access was encircled with vessel loops and prepared for control.  I then created an incision in the proximal forearm beyond the aneurysmal segment and encircled the access there for control with a vessel loop.  I then used the tunneller and tunnelled between the two incisions around the old access.  I brought a 7 mm Artergraft through the tunneller making sure to avoid twisting after marking for orientation.  The patient was then given 3000 units of intravenous heparin.  The access was then controlled and clamped and ligated distally with a silk suture ligature.  I prepared the end nearer the original arterial anastomosis for an anastomosis with the new Artegraft.  The anastomosis was created in an end to end fashion with two 6-0 Prolene sutures in the typical fashion.  I then flushed through the new graft and prepared this for the distal anastomosis.  The access was then divided and ligated again with a silk suture ligature.  The distal end was then prepared for anastomosis with the new graft.  An anastomosis was then created with two 6-0 Prolene sutures in the usual fashion.  The graft was flushed and de-aired prior to release of control.  Patch sutures with 6-0 Prolene sutures were used as needed for control of bleeding.  Surgicel and Evicel topical hemostatic agents were placed and hemostasis was complete.  I then closed the wound with 3-0 Vicryl suture in the subcutaneous space and a 4-0 Monocryl suture was used to close the skin.  Dermabond was placed as a dressing.  The patient was then awakened from anesthesia and taken to the recovery room in stable condition having tolerated the procedure well.    COMPLICATIONS: none  CONDITION: stable  Leotis Pain  12/14/2018, 2:42 PM   This note was created with Dragon Medical transcription system. Any errors in dictation are purely unintentional.

## 2018-12-14 NOTE — Anesthesia Postprocedure Evaluation (Signed)
Anesthesia Post Note  Patient: Hyman Bower  Procedure(s) Performed: REVISON OF ARTERIOVENOUS FISTULA (Left ) INSERTION OF DIALYSIS CATHETER ( PERMCATH ) (N/A )  Patient location during evaluation: PACU Anesthesia Type: General Level of consciousness: awake and alert Pain management: pain level controlled Vital Signs Assessment: post-procedure vital signs reviewed and stable Respiratory status: spontaneous breathing, nonlabored ventilation, respiratory function stable and patient connected to nasal cannula oxygen Cardiovascular status: blood pressure returned to baseline and stable Postop Assessment: no apparent nausea or vomiting Anesthetic complications: no     Last Vitals:  Vitals:   12/14/18 1628 12/14/18 1641  BP: (!) 184/82 (!) 188/83  Pulse: 68 69  Resp: 16 16  Temp: 36.5 C 36.8 C  SpO2: 96% 95%    Last Pain:  Vitals:   12/14/18 1641  TempSrc: Temporal  PainSc: 4                  Precious Haws Piscitello

## 2018-12-14 NOTE — Discharge Instructions (Signed)

## 2018-12-15 ENCOUNTER — Encounter: Payer: Self-pay | Admitting: Vascular Surgery

## 2018-12-15 DIAGNOSIS — N186 End stage renal disease: Secondary | ICD-10-CM | POA: Diagnosis not present

## 2018-12-15 DIAGNOSIS — D631 Anemia in chronic kidney disease: Secondary | ICD-10-CM | POA: Diagnosis not present

## 2018-12-15 DIAGNOSIS — D509 Iron deficiency anemia, unspecified: Secondary | ICD-10-CM | POA: Diagnosis not present

## 2018-12-15 DIAGNOSIS — Z992 Dependence on renal dialysis: Secondary | ICD-10-CM | POA: Diagnosis not present

## 2018-12-15 DIAGNOSIS — N2581 Secondary hyperparathyroidism of renal origin: Secondary | ICD-10-CM | POA: Diagnosis not present

## 2018-12-18 ENCOUNTER — Encounter: Payer: Self-pay | Admitting: Vascular Surgery

## 2018-12-18 DIAGNOSIS — N2581 Secondary hyperparathyroidism of renal origin: Secondary | ICD-10-CM | POA: Diagnosis not present

## 2018-12-18 DIAGNOSIS — D509 Iron deficiency anemia, unspecified: Secondary | ICD-10-CM | POA: Diagnosis not present

## 2018-12-18 DIAGNOSIS — N186 End stage renal disease: Secondary | ICD-10-CM | POA: Diagnosis not present

## 2018-12-18 DIAGNOSIS — Z992 Dependence on renal dialysis: Secondary | ICD-10-CM | POA: Diagnosis not present

## 2018-12-18 DIAGNOSIS — D631 Anemia in chronic kidney disease: Secondary | ICD-10-CM | POA: Diagnosis not present

## 2018-12-20 DIAGNOSIS — Z992 Dependence on renal dialysis: Secondary | ICD-10-CM | POA: Diagnosis not present

## 2018-12-20 DIAGNOSIS — D631 Anemia in chronic kidney disease: Secondary | ICD-10-CM | POA: Diagnosis not present

## 2018-12-20 DIAGNOSIS — N2581 Secondary hyperparathyroidism of renal origin: Secondary | ICD-10-CM | POA: Diagnosis not present

## 2018-12-20 DIAGNOSIS — N186 End stage renal disease: Secondary | ICD-10-CM | POA: Diagnosis not present

## 2018-12-20 DIAGNOSIS — D509 Iron deficiency anemia, unspecified: Secondary | ICD-10-CM | POA: Diagnosis not present

## 2018-12-22 DIAGNOSIS — D631 Anemia in chronic kidney disease: Secondary | ICD-10-CM | POA: Diagnosis not present

## 2018-12-22 DIAGNOSIS — D509 Iron deficiency anemia, unspecified: Secondary | ICD-10-CM | POA: Diagnosis not present

## 2018-12-22 DIAGNOSIS — N2581 Secondary hyperparathyroidism of renal origin: Secondary | ICD-10-CM | POA: Diagnosis not present

## 2018-12-22 DIAGNOSIS — Z992 Dependence on renal dialysis: Secondary | ICD-10-CM | POA: Diagnosis not present

## 2018-12-22 DIAGNOSIS — N186 End stage renal disease: Secondary | ICD-10-CM | POA: Diagnosis not present

## 2018-12-25 DIAGNOSIS — D631 Anemia in chronic kidney disease: Secondary | ICD-10-CM | POA: Diagnosis not present

## 2018-12-25 DIAGNOSIS — N186 End stage renal disease: Secondary | ICD-10-CM | POA: Diagnosis not present

## 2018-12-25 DIAGNOSIS — D509 Iron deficiency anemia, unspecified: Secondary | ICD-10-CM | POA: Diagnosis not present

## 2018-12-25 DIAGNOSIS — Z992 Dependence on renal dialysis: Secondary | ICD-10-CM | POA: Diagnosis not present

## 2018-12-25 DIAGNOSIS — N2581 Secondary hyperparathyroidism of renal origin: Secondary | ICD-10-CM | POA: Diagnosis not present

## 2018-12-27 DIAGNOSIS — N2581 Secondary hyperparathyroidism of renal origin: Secondary | ICD-10-CM | POA: Diagnosis not present

## 2018-12-27 DIAGNOSIS — D509 Iron deficiency anemia, unspecified: Secondary | ICD-10-CM | POA: Diagnosis not present

## 2018-12-27 DIAGNOSIS — Z992 Dependence on renal dialysis: Secondary | ICD-10-CM | POA: Diagnosis not present

## 2018-12-27 DIAGNOSIS — D631 Anemia in chronic kidney disease: Secondary | ICD-10-CM | POA: Diagnosis not present

## 2018-12-27 DIAGNOSIS — N186 End stage renal disease: Secondary | ICD-10-CM | POA: Diagnosis not present

## 2018-12-29 DIAGNOSIS — D631 Anemia in chronic kidney disease: Secondary | ICD-10-CM | POA: Diagnosis not present

## 2018-12-29 DIAGNOSIS — D509 Iron deficiency anemia, unspecified: Secondary | ICD-10-CM | POA: Diagnosis not present

## 2018-12-29 DIAGNOSIS — Z992 Dependence on renal dialysis: Secondary | ICD-10-CM | POA: Diagnosis not present

## 2018-12-29 DIAGNOSIS — N186 End stage renal disease: Secondary | ICD-10-CM | POA: Diagnosis not present

## 2018-12-29 DIAGNOSIS — N2581 Secondary hyperparathyroidism of renal origin: Secondary | ICD-10-CM | POA: Diagnosis not present

## 2019-01-01 DIAGNOSIS — D509 Iron deficiency anemia, unspecified: Secondary | ICD-10-CM | POA: Diagnosis not present

## 2019-01-01 DIAGNOSIS — N186 End stage renal disease: Secondary | ICD-10-CM | POA: Diagnosis not present

## 2019-01-01 DIAGNOSIS — D631 Anemia in chronic kidney disease: Secondary | ICD-10-CM | POA: Diagnosis not present

## 2019-01-01 DIAGNOSIS — Z992 Dependence on renal dialysis: Secondary | ICD-10-CM | POA: Diagnosis not present

## 2019-01-01 DIAGNOSIS — N2581 Secondary hyperparathyroidism of renal origin: Secondary | ICD-10-CM | POA: Diagnosis not present

## 2019-01-03 DIAGNOSIS — Z992 Dependence on renal dialysis: Secondary | ICD-10-CM | POA: Diagnosis not present

## 2019-01-03 DIAGNOSIS — D509 Iron deficiency anemia, unspecified: Secondary | ICD-10-CM | POA: Diagnosis not present

## 2019-01-03 DIAGNOSIS — N186 End stage renal disease: Secondary | ICD-10-CM | POA: Diagnosis not present

## 2019-01-03 DIAGNOSIS — D631 Anemia in chronic kidney disease: Secondary | ICD-10-CM | POA: Diagnosis not present

## 2019-01-03 DIAGNOSIS — N2581 Secondary hyperparathyroidism of renal origin: Secondary | ICD-10-CM | POA: Diagnosis not present

## 2019-01-04 ENCOUNTER — Other Ambulatory Visit: Payer: Self-pay

## 2019-01-04 ENCOUNTER — Encounter (INDEPENDENT_AMBULATORY_CARE_PROVIDER_SITE_OTHER): Payer: Self-pay | Admitting: Nurse Practitioner

## 2019-01-04 ENCOUNTER — Ambulatory Visit (INDEPENDENT_AMBULATORY_CARE_PROVIDER_SITE_OTHER): Payer: Medicare Other | Admitting: Nurse Practitioner

## 2019-01-04 VITALS — BP 179/88 | HR 59 | Resp 16 | Ht 67.0 in | Wt 162.0 lb

## 2019-01-04 DIAGNOSIS — E785 Hyperlipidemia, unspecified: Secondary | ICD-10-CM

## 2019-01-04 DIAGNOSIS — Z720 Tobacco use: Secondary | ICD-10-CM

## 2019-01-04 DIAGNOSIS — Z79899 Other long term (current) drug therapy: Secondary | ICD-10-CM

## 2019-01-04 DIAGNOSIS — N186 End stage renal disease: Secondary | ICD-10-CM

## 2019-01-04 DIAGNOSIS — Z992 Dependence on renal dialysis: Secondary | ICD-10-CM

## 2019-01-04 NOTE — Progress Notes (Signed)
SUBJECTIVE:  Patient ID: Daryl Weeks, male    DOB: Nov 30, 1960, 58 y.o.   MRN: [id number] Chief Complaint  Patient presents with   Follow-up    [facility] 4week follow up    HPI  Daryl Weeks is a 58 y.o. male that presents for follow up after surgical revision and jump graft placement due to his aneurysmal fistula.  The graft has not yet been used.  He denies and issues with his perm cath currently.  He states the the graft is still sore and has pains at times.  Otherwise he has had no issues. He denies any swelling of this arm. He denies any fever, chills, nausea or vomiting.  He denies chest pain or shortness of breath.    Past Medical History:  Diagnosis Date   Anemia    Anxiety    Arthritis    CHF (congestive heart failure) ([place])    Chronic kidney disease on chronic dialysis ([place])    dialysis on m, w, f   Colon polyps    COPD (chronic obstructive pulmonary disease) ([place])    Coronary artery disease    Non-ST elevation myocardial infarction in February of 2015. In the setting of hypertensive urgency and blood loss anemia. Cardiac catheterization showed significant three-vessel coronary artery disease. EF was 55% by echo. He underwent CABG at [facility] on May 19 with LIMA to LAD, Sequential SVG to OM1 and OM2, and SVG to RCA   End stage renal disease ([place])    m-w-f   -davida Cobb   End stage renal disease ([place])    ETOH abuse    GERD (gastroesophageal reflux disease)    hx   Hyperlipidemia    Hypertension    IgA nephropathy    Lower GI bleed    Due to colon polyps. Status post resection of 14 polyps   Non-ST elevation MI (NSTEMI) (HCC)    Pneumonia    hx   Rheumatoid arthritis (HCC)    Shortness of breath    Sleep apnea    Tobacco abuse    Tobacco abuse     Past Surgical History:  Procedure Laterality Date   A/V FISTULAGRAM Left 10/28/2016   Procedure: A/V Fistulagram;  Surgeon: [name], MD;  Location: [place] [facility];  Service:  Cardiovascular;  Laterality: Left;   A/V FISTULAGRAM Left 12/07/2018   Procedure: A/V FISTULAGRAM;  Surgeon: [name], MD;  Location: [place] [facility];  Service: Cardiovascular;  Laterality: Left;   A/V SHUNT INTERVENTION N/A 10/28/2016   Procedure: A/V Shunt Intervention;  Surgeon: [name], MD;  Location: [place] [facility];  Service: Cardiovascular;  Laterality: N/A;   AV FISTULA PLACEMENT     CARDIAC CATHETERIZATION     RCA 90% and calcified mid LAD 80% Stenosis   CORONARY ARTERY BYPASS GRAFT N/A 11/29/2013   Procedure: CORONARY ARTERY BYPASS GRAFTING (CABG) x 4 using endoscopically harvested right saphenous vein and left internal mammary artery;  Surgeon: [name], MD;  Location: [place] OR;  Service: Open Heart Surgery;  Laterality: N/A;   dialysis catheterr  2/15   INSERTION OF DIALYSIS CATHETER N/A 12/14/2018   Procedure: INSERTION OF DIALYSIS CATHETER ( [place] );  Surgeon: [name], MD;  Location: [facility];  Service: Vascular;  Laterality: N/A;   INTRAOPERATIVE TRANSESOPHAGEAL ECHOCARDIOGRAM N/A 11/29/2013   Procedure: INTRAOPERATIVE TRANSESOPHAGEAL ECHOCARDIOGRAM;  Surgeon: [name], MD;  Location: [place] [facility];  Service: Open Heart Surgery;  Laterality: N/A;   PERIPHERAL VASCULAR CATHETERIZATION N/A 06/12/2015   Procedure: A/V Shuntogram/Fistulagram;  Surgeon: [name], MD;  Location: [place] [facility];  Service: Cardiovascular;  Laterality: N/A;   PERIPHERAL VASCULAR CATHETERIZATION Left 06/12/2015   Procedure: A/V Shunt Intervention;  Surgeon: [name], MD;  Location: [place] [facility];  Service: Cardiovascular;  Laterality: Left;   RENAL BIOPSY Left 14   REVISON OF ARTERIOVENOUS FISTULA Left 12/14/2018   Procedure: REVISON OF ARTERIOVENOUS FISTULA;  Surgeon: [name], MD;  Location: [facility] OR[facility];  Service: Vascular;  Laterality: Left;    Social History   Socioeconomic History   Marital status: Single    Spouse name: Not on file   Number of  children: Not on file   Years of education: Not on file   Highest education level: 8th grade  Occupational History   Not on file  Social Needs   Financial resource strain: Not hard at all   Food insecurity:    Worry: Never true    Inability: Never true   Transportation needs:    Medical: No    Non-medical: No  Tobacco Use   Smoking status: Current Every Day Smoker    Packs/day: 0.50    Years: 32.00    Pack years: 16.00    Types: Cigarettes   Smokeless tobacco: Never Used   Tobacco comment: daily  Substance and Sexual Activity   Alcohol use: Yes    Alcohol/week: 6.0 standard drinks    Types: 6 Cans of beer per week    Comment: weekly   Drug use: No   Sexual activity: Not on file  Lifestyle   Physical activity:    Days per week: 0 days    Minutes per session: 0 min   Stress: Not at all  Relationships   Social connections:    Talks on phone: More than three times a week    Gets together: More than three times a week    Attends religious service: Never    Active member of club or organization: No    Attends meetings of clubs or organizations: Never    Relationship status: Divorced   Intimate partner violence:    Fear of current or ex partner: No    Emotionally abused: No    Physically abused: No    Forced sexual activity: No  Other Topics Concern   Not on file  Social History Narrative   Not on file    Family History  Problem Relation Age of Onset   Heart disease Father    Heart disease Brother    Healthy Sister    Stroke Neg Hx     Allergies  Allergen Reactions   Lisinopril      Review of Systems   Review of Systems: Negative Unless Checked Constitutional: [] Weight loss  [] Fever  [] Chills Cardiac: [] Chest pain   []  Atrial Fibrillation  [] Palpitations   [] Shortness of breath when laying flat   [] Shortness of breath with exertion. [] Shortness of breath at rest Vascular:  [] Pain in legs with walking   [] Pain in legs with standing  [] Pain in legs when laying flat   [] Claudication    [] Pain in feet when laying flat    [] History of DVT   [] Phlebitis   [] Swelling in legs   [] Varicose veins   [] Non-healing ulcers Pulmonary:   [] Uses home oxygen   [] Productive cough   [] Hemoptysis   [] Wheeze  [] COPD   [] Asthma Neurologic:  []   Dizziness   [] Seizures  [] Blackouts [] History of stroke   [] History of TIA  [] Aphasia   [] Temporary Blindness   [] Weakness or numbness in arm   [] Weakness or numbness in leg Musculoskeletal:   [] Joint swelling   [] Joint pain   [] Low back pain  []  History of Knee Replacement [] Arthritis [] back Surgeries  []  Spinal Stenosis    Hematologic:  [] Easy bruising  [] Easy bleeding   [] Hypercoagulable state   [] Anemic Gastrointestinal:  [] Diarrhea   [] Vomiting  [] Gastroesophageal reflux/heartburn   [] Difficulty swallowing. [] Abdominal pain Genitourinary:  [x] Chronic kidney disease   [] Difficult urination  [] Anuric   [] Blood in urine [] Frequent urination  [] Burning with urination   [] Hematuria Skin:  [] Rashes   [] Ulcers [] Wounds Psychological:  [] History of anxiety   []  History of major depression  []  Memory Difficulties      OBJECTIVE:   Physical Exam  BP (!) 179/88 (BP Location: Right Arm)    Pulse (!) 59    Resp 16    Ht 5\' 7"  (1.702 m)    Wt 162 lb (73.5 kg)    BMI 25.37 kg/m   Gen: WD/WN, NAD Head: [place]/AT, No temporalis wasting.  Ear/Nose/Throat: Hearing grossly intact, nares w/o erythema or drainage Eyes: PER, EOMI, sclera nonicteric.  Neck: Supple, no masses.  No JVD.  Pulmonary:  Good air movement, no use of accessory muscles.  Cardiac: RRR Vascular: pulsatile thrill, soft bruit Vessel Right Left  Radial Palpable Palpable   Gastrointestinal: soft, non-distended. No guarding/no peritoneal signs.  Musculoskeletal: M/S 5/5 throughout.  No deformity or atrophy.  Neurologic: Pain and light touch intact in extremities.  Symmetrical.  Speech is fluent. Motor exam as listed above. Psychiatric: Judgment  intact, Mood & affect appropriate for pt's clinical situation. Dermatologic: No Venous rashes. No Ulcers Noted.  No changes consistent with cellulitis. Lymph : No Cervical lymphadenopathy, no lichenification or skin changes of chronic lymphedema.       ASSESSMENT AND PLAN:  1. Hyperlipidemia, unspecified hyperlipidemia type Continue statin as ordered and reviewed, no changes at this time   2. Tobacco abuse Smoking cessation was discussed, 3-10 minutes spent on this topic specifically   3. ESRD on dialysis [facility]) We will allow his fistula to continue to heal for another 3 weeks due to his continued pain.  We will also have an HDA done to get a look at his fistula to ensure that it will be adequate once they begin using it due to the pulsatility of his fistula.  In the meantime he will continue to utilize his PermCath. - VAS US DUPLEX DIALYSIS ACCESS (AVF, AVG); Future   Current Outpatient Medications on File Prior to Visit  Medication Sig Dispense Refill   albuterol (VENTOLIN HFA) 108 (90 Base) MCG/ACT inhaler Inhale 2 puffs into the lungs every 6 (six) hours as needed for wheezing or shortness of breath.     amLODipine (NORVASC) 10 MG tablet take 1 tablet by mouth once daily (Patient taking differently: Take 10 mg by mouth at bedtime. take 1 tablet by mouth once daily) 30 tablet 5   aspirin EC 81 MG tablet Take 1 tablet (81 mg total) by mouth daily. 90 tablet 3   atorvastatin (LIPITOR) 20 MG tablet take 1 tablet by mouth at bedtime 30 tablet 6   AURYXIA 1 GM 210 MG(Fe) tablet TK 1 T PO  TID BEFORE EACH MEAL  11   budesonide (PULMICORT FLEXHALER) 180 MCG/ACT inhaler Inhale 1 puff into the lungs 2 (two) times  daily. 1 each 11   budesonide (PULMICORT) 0.5 MG/2ML nebulizer solution Take 2 mLs (0.5 mg total) by nebulization every 12 (twelve) hours. 60 mL 11   calcium carbonate (TUMS - DOSED IN MG ELEMENTAL CALCIUM) 500 MG chewable tablet Chew 1 tablet by mouth as needed.       cinacalcet (SENSIPAR) 30 MG tablet Take 30 mg by mouth daily with supper.      cloNIDine (CATAPRES) 0.1 MG tablet TK 1 T PO BID FOR HIGH BP  11   doxycycline (VIBRA-TABS) 100 MG tablet Take 100 mg by mouth 2 (two) times daily.     furosemide (LASIX) 80 MG tablet Take 80 mg by mouth 2 (two) times daily.      gabapentin (NEURONTIN) 100 MG capsule TK 1 C PO TID  11   HYDROcodone-acetaminophen (NORCO) 5-325 MG tablet Take 1 tablet by mouth every 6 (six) hours as needed for moderate pain. 30 tablet 0   irbesartan (AVAPRO) 150 MG tablet TK 1 T PO D AT NIGHT FOR HIGH BP  11   irbesartan (AVAPRO) 300 MG tablet Take 300 mg by mouth at bedtime.   0   lidocaine-prilocaine (EMLA) cream Apply 1 application topically as needed.     metoprolol tartrate (LOPRESSOR) 100 MG tablet Take 1 tablet (100 mg total) by mouth 2 (two) times daily. 180 tablet 3   multivitamin (RENA-VIT) TABS tablet Take 1 tablet by mouth daily.   11   omeprazole (PRILOSEC) 20 MG capsule Take 20 mg by mouth every morning.   5   ondansetron (ZOFRAN) 4 MG tablet TK 1 T PO BID PRF NAUSEA  1   sevelamer carbonate (RENVELA) 800 MG tablet Take 800 mg by mouth 3 (three) times daily with meals.     tiotropium (SPIRIVA) 18 MCG inhalation capsule Place 1 capsule (18 mcg total) into inhaler and inhale daily. (Patient taking differently: Place 18 mcg into inhaler and inhale every evening. ) 30 capsule 12   zolpidem (AMBIEN) 10 MG tablet Take 10 mg by mouth at bedtime.      Current Facility-Administered Medications on File Prior to Visit  Medication Dose Route Frequency Provider Last Rate Last Dose   ceFAZolin (ANCEF) IVPB 1 g/50 mL premix  1 g Intravenous Once Dew, Erskine Squibb, MD        There are no Patient Instructions on file for this visit. No follow-ups on file.   Kris Hartmann, NP  This note was completed with Sales executive.  Any errors are purely unintentional.

## 2019-01-05 DIAGNOSIS — N186 End stage renal disease: Secondary | ICD-10-CM | POA: Diagnosis not present

## 2019-01-05 DIAGNOSIS — D509 Iron deficiency anemia, unspecified: Secondary | ICD-10-CM | POA: Diagnosis not present

## 2019-01-05 DIAGNOSIS — N2581 Secondary hyperparathyroidism of renal origin: Secondary | ICD-10-CM | POA: Diagnosis not present

## 2019-01-05 DIAGNOSIS — D631 Anemia in chronic kidney disease: Secondary | ICD-10-CM | POA: Diagnosis not present

## 2019-01-05 DIAGNOSIS — Z992 Dependence on renal dialysis: Secondary | ICD-10-CM | POA: Diagnosis not present

## 2019-01-08 DIAGNOSIS — N2581 Secondary hyperparathyroidism of renal origin: Secondary | ICD-10-CM | POA: Diagnosis not present

## 2019-01-08 DIAGNOSIS — D509 Iron deficiency anemia, unspecified: Secondary | ICD-10-CM | POA: Diagnosis not present

## 2019-01-08 DIAGNOSIS — D631 Anemia in chronic kidney disease: Secondary | ICD-10-CM | POA: Diagnosis not present

## 2019-01-08 DIAGNOSIS — N186 End stage renal disease: Secondary | ICD-10-CM | POA: Diagnosis not present

## 2019-01-08 DIAGNOSIS — Z992 Dependence on renal dialysis: Secondary | ICD-10-CM | POA: Diagnosis not present

## 2019-01-09 ENCOUNTER — Encounter (INDEPENDENT_AMBULATORY_CARE_PROVIDER_SITE_OTHER): Payer: Medicare Other

## 2019-01-09 ENCOUNTER — Ambulatory Visit (INDEPENDENT_AMBULATORY_CARE_PROVIDER_SITE_OTHER): Payer: Medicare Other | Admitting: Vascular Surgery

## 2019-01-10 DIAGNOSIS — D631 Anemia in chronic kidney disease: Secondary | ICD-10-CM | POA: Diagnosis not present

## 2019-01-10 DIAGNOSIS — N186 End stage renal disease: Secondary | ICD-10-CM | POA: Diagnosis not present

## 2019-01-10 DIAGNOSIS — N2581 Secondary hyperparathyroidism of renal origin: Secondary | ICD-10-CM | POA: Diagnosis not present

## 2019-01-10 DIAGNOSIS — Z992 Dependence on renal dialysis: Secondary | ICD-10-CM | POA: Diagnosis not present

## 2019-01-10 DIAGNOSIS — D509 Iron deficiency anemia, unspecified: Secondary | ICD-10-CM | POA: Diagnosis not present

## 2019-01-11 DIAGNOSIS — Z992 Dependence on renal dialysis: Secondary | ICD-10-CM | POA: Diagnosis not present

## 2019-01-11 DIAGNOSIS — N186 End stage renal disease: Secondary | ICD-10-CM | POA: Diagnosis not present

## 2019-01-12 DIAGNOSIS — N2581 Secondary hyperparathyroidism of renal origin: Secondary | ICD-10-CM | POA: Diagnosis not present

## 2019-01-12 DIAGNOSIS — N186 End stage renal disease: Secondary | ICD-10-CM | POA: Diagnosis not present

## 2019-01-12 DIAGNOSIS — D631 Anemia in chronic kidney disease: Secondary | ICD-10-CM | POA: Diagnosis not present

## 2019-01-12 DIAGNOSIS — Z992 Dependence on renal dialysis: Secondary | ICD-10-CM | POA: Diagnosis not present

## 2019-01-12 DIAGNOSIS — D509 Iron deficiency anemia, unspecified: Secondary | ICD-10-CM | POA: Diagnosis not present

## 2019-01-15 DIAGNOSIS — D509 Iron deficiency anemia, unspecified: Secondary | ICD-10-CM | POA: Diagnosis not present

## 2019-01-15 DIAGNOSIS — N2581 Secondary hyperparathyroidism of renal origin: Secondary | ICD-10-CM | POA: Diagnosis not present

## 2019-01-15 DIAGNOSIS — N186 End stage renal disease: Secondary | ICD-10-CM | POA: Diagnosis not present

## 2019-01-15 DIAGNOSIS — Z992 Dependence on renal dialysis: Secondary | ICD-10-CM | POA: Diagnosis not present

## 2019-01-15 DIAGNOSIS — D631 Anemia in chronic kidney disease: Secondary | ICD-10-CM | POA: Diagnosis not present

## 2019-01-17 DIAGNOSIS — D631 Anemia in chronic kidney disease: Secondary | ICD-10-CM | POA: Diagnosis not present

## 2019-01-17 DIAGNOSIS — D509 Iron deficiency anemia, unspecified: Secondary | ICD-10-CM | POA: Diagnosis not present

## 2019-01-17 DIAGNOSIS — N2581 Secondary hyperparathyroidism of renal origin: Secondary | ICD-10-CM | POA: Diagnosis not present

## 2019-01-17 DIAGNOSIS — Z992 Dependence on renal dialysis: Secondary | ICD-10-CM | POA: Diagnosis not present

## 2019-01-17 DIAGNOSIS — N186 End stage renal disease: Secondary | ICD-10-CM | POA: Diagnosis not present

## 2019-01-19 DIAGNOSIS — N2581 Secondary hyperparathyroidism of renal origin: Secondary | ICD-10-CM | POA: Diagnosis not present

## 2019-01-19 DIAGNOSIS — D631 Anemia in chronic kidney disease: Secondary | ICD-10-CM | POA: Diagnosis not present

## 2019-01-19 DIAGNOSIS — D509 Iron deficiency anemia, unspecified: Secondary | ICD-10-CM | POA: Diagnosis not present

## 2019-01-19 DIAGNOSIS — N186 End stage renal disease: Secondary | ICD-10-CM | POA: Diagnosis not present

## 2019-01-19 DIAGNOSIS — Z992 Dependence on renal dialysis: Secondary | ICD-10-CM | POA: Diagnosis not present

## 2019-01-22 DIAGNOSIS — N186 End stage renal disease: Secondary | ICD-10-CM | POA: Diagnosis not present

## 2019-01-22 DIAGNOSIS — N2581 Secondary hyperparathyroidism of renal origin: Secondary | ICD-10-CM | POA: Diagnosis not present

## 2019-01-22 DIAGNOSIS — D631 Anemia in chronic kidney disease: Secondary | ICD-10-CM | POA: Diagnosis not present

## 2019-01-22 DIAGNOSIS — D509 Iron deficiency anemia, unspecified: Secondary | ICD-10-CM | POA: Diagnosis not present

## 2019-01-22 DIAGNOSIS — Z992 Dependence on renal dialysis: Secondary | ICD-10-CM | POA: Diagnosis not present

## 2019-01-24 DIAGNOSIS — N2581 Secondary hyperparathyroidism of renal origin: Secondary | ICD-10-CM | POA: Diagnosis not present

## 2019-01-24 DIAGNOSIS — N186 End stage renal disease: Secondary | ICD-10-CM | POA: Diagnosis not present

## 2019-01-24 DIAGNOSIS — D631 Anemia in chronic kidney disease: Secondary | ICD-10-CM | POA: Diagnosis not present

## 2019-01-24 DIAGNOSIS — D509 Iron deficiency anemia, unspecified: Secondary | ICD-10-CM | POA: Diagnosis not present

## 2019-01-24 DIAGNOSIS — Z992 Dependence on renal dialysis: Secondary | ICD-10-CM | POA: Diagnosis not present

## 2019-01-25 ENCOUNTER — Ambulatory Visit (INDEPENDENT_AMBULATORY_CARE_PROVIDER_SITE_OTHER): Payer: Medicare Other

## 2019-01-25 ENCOUNTER — Ambulatory Visit (INDEPENDENT_AMBULATORY_CARE_PROVIDER_SITE_OTHER): Payer: Medicare Other | Admitting: Nurse Practitioner

## 2019-01-25 ENCOUNTER — Other Ambulatory Visit: Payer: Self-pay

## 2019-01-25 ENCOUNTER — Encounter (INDEPENDENT_AMBULATORY_CARE_PROVIDER_SITE_OTHER): Payer: Self-pay | Admitting: Nurse Practitioner

## 2019-01-25 VITALS — BP 178/76 | HR 61 | Resp 16 | Wt 162.0 lb

## 2019-01-25 DIAGNOSIS — Z992 Dependence on renal dialysis: Secondary | ICD-10-CM

## 2019-01-25 DIAGNOSIS — I10 Essential (primary) hypertension: Secondary | ICD-10-CM

## 2019-01-25 DIAGNOSIS — I120 Hypertensive chronic kidney disease with stage 5 chronic kidney disease or end stage renal disease: Secondary | ICD-10-CM

## 2019-01-25 DIAGNOSIS — E785 Hyperlipidemia, unspecified: Secondary | ICD-10-CM

## 2019-01-25 DIAGNOSIS — N186 End stage renal disease: Secondary | ICD-10-CM

## 2019-01-25 DIAGNOSIS — Z720 Tobacco use: Secondary | ICD-10-CM

## 2019-01-25 NOTE — Progress Notes (Signed)
SUBJECTIVE:  Patient ID: Daryl Weeks, male    DOB: 1961/02/02, 58 y.o.   MRN: [id number] Chief Complaint  Patient presents with  . Follow-up    3week HDA    HPI  Daryl Weeks is a 58 y.o. male that presents today for evaluation of his recent revision of his right Artegraft following ligation of his aneurysmal AV fistula.  Currently the patient is still maintained via PermCath.  He denies any issues currently with his PermCath.  He denies any fever, chills, nausea, vomiting or diarrhea.  He underwent noninvasive studies today which showed a patent radial basilic forearm AV fistula with ligated area of old aneurysmal forearm AV fistula and a patent jump Artegraft.  There is significant high velocity flow at the arterial anastomosis site with velocities greater than 700 cm/s.  His overall flow volume was 575.  Past Medical History:  Diagnosis Date  . Anemia   . Anxiety   . Arthritis   . CHF (congestive heart failure) ([place])   . Chronic kidney disease on chronic dialysis ([place])    dialysis on m, w, f  . Colon polyps   . COPD (chronic obstructive pulmonary disease) ([place])   . Coronary artery disease    Non-ST elevation myocardial infarction in February of 2015. In the setting of hypertensive urgency and blood loss anemia. Cardiac catheterization showed significant three-vessel coronary artery disease. EF was 55% by echo. He underwent CABG at [facility] on May 19 with LIMA to LAD, Sequential SVG to OM1 and OM2, and SVG to RCA  . End stage renal disease ([place])    m-w-f   -davida Maxeys  . End stage renal disease ([place])   . ETOH abuse   . GERD (gastroesophageal reflux disease)    hx  . Hyperlipidemia   . Hypertension   . IgA nephropathy   . Lower GI bleed    Due to colon polyps. Status post resection of 14 polyps  . Non-ST elevation MI (NSTEMI) ([place])   . Pneumonia    hx  . Rheumatoid arthritis ([place])   . Shortness of breath   . Sleep apnea   . Tobacco abuse   . Tobacco abuse      Past Surgical History:  Procedure Laterality Date  . A/V FISTULAGRAM Left 10/28/2016   Procedure: A/V Fistulagram;  Surgeon: [name], MD;  Location: [place] [facility];  Service: Cardiovascular;  Laterality: Left;  . A/V FISTULAGRAM Left 12/07/2018   Procedure: A/V FISTULAGRAM;  Surgeon: [name], MD;  Location: [place] [facility];  Service: Cardiovascular;  Laterality: Left;  . A/V SHUNT INTERVENTION N/A 10/28/2016   Procedure: A/V Shunt Intervention;  Surgeon: [name], MD;  Location: [place] [facility];  Service: Cardiovascular;  Laterality: N/A;  . AV FISTULA PLACEMENT    . CARDIAC CATHETERIZATION     RCA 90% and calcified mid LAD 80% Stenosis  . CORONARY ARTERY BYPASS GRAFT N/A 11/29/2013   Procedure: CORONARY ARTERY BYPASS GRAFTING (CABG) x 4 using endoscopically harvested right saphenous vein and left internal mammary artery;  Surgeon: [name], MD;  Location: [place] OR;  Service: Open Heart Surgery;  Laterality: N/A;  . dialysis catheterr  2/15  . INSERTION OF DIALYSIS CATHETER N/A 12/14/2018   Procedure: INSERTION OF DIALYSIS CATHETER ( [place] );  Surgeon: [name], MD;  Location: [facility];  Service: Vascular;  Laterality: N/A;  . INTRAOPERATIVE TRANSESOPHAGEAL ECHOCARDIOGRAM N/A 11/29/2013   Procedure: INTRAOPERATIVE  TRANSESOPHAGEAL ECHOCARDIOGRAM;  Surgeon: [name], MD;  Location: [facility] OR;  Service: Open Heart Surgery;  Laterality: N/A;  . PERIPHERAL VASCULAR CATHETERIZATION N/A 06/12/2015   Procedure: A/V Shuntogram/Fistulagram;  Surgeon: [name], MD;  Location: [place] [facility];  Service: Cardiovascular;  Laterality: N/A;  . PERIPHERAL VASCULAR CATHETERIZATION Left 06/12/2015   Procedure: A/V Shunt Intervention;  Surgeon: [name], MD;  Location: [place] [facility];  Service: Cardiovascular;  Laterality: Left;  . RENAL BIOPSY Left 14  . REVISON OF ARTERIOVENOUS FISTULA Left 12/14/2018   Procedure: REVISON OF ARTERIOVENOUS FISTULA;  Surgeon: [name], MD;  Location: [facility];  Service: Vascular;  Laterality: Left;    Social History   Socioeconomic History  . Marital status: Single    Spouse name: Not on file  . Number of children: Not on file  . Years of education: Not on file  . Highest education level: 8th grade  Occupational History  . Not on file  Social Needs  . Financial resource strain: Not hard at all  . Food insecurity:    Worry: Never true    Inability: Never true  . Transportation needs:    Medical: No    Non-medical: No  Tobacco Use  . Smoking status: Current Every Day Smoker    Packs/day: 0.50    Years: 32.00    Pack years: 16.00    Types: Cigarettes  . Smokeless tobacco: Never Used  . Tobacco comment: daily  Substance and Sexual Activity  . Alcohol use: Yes    Alcohol/week: 6.0 standard drinks    Types: 6 Cans of beer per week    Comment: weekly  . Drug use: No  . Sexual activity: Not on file  Lifestyle  . Physical activity:    Days per week: 0 days    Minutes per session: 0 min  . Stress: Not at all  Relationships  . Social connections:    Talks on phone: More than three times a week    Gets together: More than three times a week    Attends religious service: Never    Active member of club or organization: No    Attends meetings of clubs or organizations: Never    Relationship status: Divorced  . Intimate partner violence:    Fear of current or ex partner: No    Emotionally abused: No    Physically abused: No    Forced sexual activity: No  Other Topics Concern  . Not on file  Social History Narrative  . Not on file    Family History  Problem Relation Age of Onset  . Heart disease Father   . Heart disease Brother   . Healthy Sister   . Stroke Neg Hx     Allergies  Allergen Reactions  . Lisinopril      Review of Systems   Review of Systems: Negative Unless Checked Constitutional: [] Weight loss  [] Fever  [] Chills Cardiac: [] Chest pain   []  Atrial Fibrillation   [] Palpitations   [] Shortness of breath when laying flat   [] Shortness of breath with exertion. [] Shortness of breath at rest Vascular:  [] Pain in legs with walking   [] Pain in legs with standing [] Pain in legs when laying flat   [] Claudication    [] Pain in feet when laying flat    [] History of DVT   [] Phlebitis   [] Swelling in legs   [] Varicose veins   [] Non-healing ulcers Pulmonary:   [] Uses home  oxygen   [] Productive cough   [] Hemoptysis   [] Wheeze  [] COPD   [] Asthma Neurologic:  [] Dizziness   [] Seizures  [] Blackouts [] History of stroke   [] History of TIA  [] Aphasia   [] Temporary Blindness   [] Weakness or numbness in arm   [] Weakness or numbness in leg Musculoskeletal:   [] Joint swelling   [] Joint pain   [] Low back pain  []  History of Knee Replacement [] Arthritis [] back Surgeries  []  Spinal Stenosis    Hematologic:  [] Easy bruising  [] Easy bleeding   [] Hypercoagulable state   [x] Anemic Gastrointestinal:  [] Diarrhea   [] Vomiting  [x] Gastroesophageal reflux/heartburn   [] Difficulty swallowing. [] Abdominal pain Genitourinary:  [x] Chronic kidney disease   [] Difficult urination  [] Anuric   [] Blood in urine [] Frequent urination  [] Burning with urination   [] Hematuria Skin:  [] Rashes   [] Ulcers [] Wounds Psychological:  [x] History of anxiety   []  History of major depression  []  Memory Difficulties      OBJECTIVE:   Physical Exam  BP (!) 178/76 (BP Location: Right Arm)   Pulse 61   Resp 16   Wt 162 lb (73.5 kg)   BMI 25.37 kg/m   Gen: WD/WN, NAD Head: [place]/AT, No temporalis wasting.  Ear/Nose/Throat: Hearing grossly intact, nares w/o erythema or drainage Eyes: PER, EOMI, sclera nonicteric.  Neck: Supple, no masses.  No JVD.  Pulmonary:  Good air movement, no use of accessory muscles.  Cardiac: RRR Vascular:  Good thrill and bruit Vessel Right Left  Radial Palpable Palpable   Gastrointestinal: soft, non-distended. No guarding/no peritoneal signs.  Musculoskeletal: M/S 5/5 throughout.  No  deformity or atrophy.  Neurologic: Pain and light touch intact in extremities.  Symmetrical.  Speech is fluent. Motor exam as listed above. Psychiatric: Judgment intact, Mood & affect appropriate for pt's clinical situation. Dermatologic: No Venous rashes. No Ulcers Noted.  No changes consistent with cellulitis. Lymph : No Cervical lymphadenopathy, no lichenification or skin changes of chronic lymphedema.       ASSESSMENT AND PLAN:  1. ESRD on dialysis [facility]) Recommend:  The patient is experiencing increasing problems with their dialysis access.  Patient should have a left shuntogram with the intention for intervention.  The intention for intervention is to restore appropriate flow and prevent thrombosis and possible loss of the access.  As well as improve the quality of dialysis therapy.  The risks, benefits and alternative therapies were reviewed in detail with the patient.  All questions were answered.  The patient agrees to proceed with angio/intervention.      2. Hyperlipidemia, unspecified hyperlipidemia type Continue statin as ordered and reviewed, no changes at this time   3. Essential hypertension Continue antihypertensive medications as already ordered, these medications have been reviewed and there are no changes at this time.   4. Tobacco abuse Smoking cessation was discussed, 3-10 minutes spent on this topic specifically    Current Outpatient Medications on File Prior to Visit  Medication Sig Dispense Refill  . albuterol (VENTOLIN HFA) 108 (90 Base) MCG/ACT inhaler Inhale 2 puffs into the lungs every 6 (six) hours as needed for wheezing or shortness of breath.    [name] amLODipine (NORVASC) 10 MG tablet take 1 tablet by mouth once daily (Patient taking differently: Take 10 mg by mouth at bedtime. take 1 tablet by mouth once daily) 30 tablet 5  . aspirin EC 81 MG tablet Take 1 tablet (81 mg total) by mouth daily. 90 tablet 3  . atorvastatin (LIPITOR) 20 MG tablet take 1  tablet  by mouth at bedtime 30 tablet 6  . AURYXIA 1 GM 210 MG(Fe) tablet TK 1 T PO  TID BEFORE EACH MEAL  11  . budesonide (PULMICORT FLEXHALER) 180 MCG/ACT inhaler Inhale 1 puff into the lungs 2 (two) times daily. 1 each 11  . budesonide (PULMICORT) 0.5 MG/2ML nebulizer solution Take 2 mLs (0.5 mg total) by nebulization every 12 (twelve) hours. 60 mL 11  . calcium carbonate (TUMS - DOSED IN MG ELEMENTAL CALCIUM) 500 MG chewable tablet Chew 1 tablet by mouth as needed.     . cinacalcet (SENSIPAR) 30 MG tablet Take 30 mg by mouth daily with supper.     . cloNIDine (CATAPRES) 0.1 MG tablet TK 1 T PO BID FOR HIGH BP  11  . doxycycline (VIBRA-TABS) 100 MG tablet Take 100 mg by mouth 2 (two) times daily.    . furosemide (LASIX) 80 MG tablet Take 80 mg by mouth 2 (two) times daily.     [name] gabapentin (NEURONTIN) 100 MG capsule TK 1 C PO TID  11  . HYDROcodone-acetaminophen (NORCO) 5-325 MG tablet Take 1 tablet by mouth every 6 (six) hours as needed for moderate pain. 30 tablet 0  . irbesartan (AVAPRO) 150 MG tablet TK 1 T PO D AT NIGHT FOR HIGH BP  11  . irbesartan (AVAPRO) 300 MG tablet Take 300 mg by mouth at bedtime.   0  . lidocaine-prilocaine (EMLA) cream Apply 1 application topically as needed.    . metoprolol tartrate (LOPRESSOR) 100 MG tablet Take 1 tablet (100 mg total) by mouth 2 (two) times daily. 180 tablet 3  . multivitamin (RENA-VIT) TABS tablet Take 1 tablet by mouth daily.   11  . omeprazole (PRILOSEC) 20 MG capsule Take 20 mg by mouth every morning.   5  . ondansetron (ZOFRAN) 4 MG tablet TK 1 T PO BID PRF NAUSEA  1  . sevelamer carbonate (RENVELA) 800 MG tablet Take 800 mg by mouth 3 (three) times daily with meals.    . tiotropium (SPIRIVA) 18 MCG inhalation capsule Place 1 capsule (18 mcg total) into inhaler and inhale daily. (Patient taking differently: Place 18 mcg into inhaler and inhale every evening. ) 30 capsule 12  . zolpidem (AMBIEN) 10 MG tablet Take 10 mg by mouth at bedtime.       Current Facility-Administered Medications on File Prior to Visit  Medication Dose Route Frequency Provider Last Rate Last Dose  . ceFAZolin (ANCEF) IVPB 1 g/50 mL premix  1 g Intravenous Once Dew, Erskine Squibb, MD        There are no Patient Instructions on file for this visit. No follow-ups on file.   Kris Hartmann, NP  This note was completed with Sales executive.  Any errors are purely unintentional.

## 2019-01-26 ENCOUNTER — Encounter (INDEPENDENT_AMBULATORY_CARE_PROVIDER_SITE_OTHER): Payer: Self-pay

## 2019-01-26 DIAGNOSIS — Z992 Dependence on renal dialysis: Secondary | ICD-10-CM | POA: Diagnosis not present

## 2019-01-26 DIAGNOSIS — D631 Anemia in chronic kidney disease: Secondary | ICD-10-CM | POA: Diagnosis not present

## 2019-01-26 DIAGNOSIS — D509 Iron deficiency anemia, unspecified: Secondary | ICD-10-CM | POA: Diagnosis not present

## 2019-01-26 DIAGNOSIS — N186 End stage renal disease: Secondary | ICD-10-CM | POA: Diagnosis not present

## 2019-01-26 DIAGNOSIS — N2581 Secondary hyperparathyroidism of renal origin: Secondary | ICD-10-CM | POA: Diagnosis not present

## 2019-01-29 ENCOUNTER — Other Ambulatory Visit (INDEPENDENT_AMBULATORY_CARE_PROVIDER_SITE_OTHER): Payer: Self-pay | Admitting: Nurse Practitioner

## 2019-01-29 DIAGNOSIS — N2581 Secondary hyperparathyroidism of renal origin: Secondary | ICD-10-CM | POA: Diagnosis not present

## 2019-01-29 DIAGNOSIS — D509 Iron deficiency anemia, unspecified: Secondary | ICD-10-CM | POA: Diagnosis not present

## 2019-01-29 DIAGNOSIS — D631 Anemia in chronic kidney disease: Secondary | ICD-10-CM | POA: Diagnosis not present

## 2019-01-29 DIAGNOSIS — N186 End stage renal disease: Secondary | ICD-10-CM | POA: Diagnosis not present

## 2019-01-29 DIAGNOSIS — Z992 Dependence on renal dialysis: Secondary | ICD-10-CM | POA: Diagnosis not present

## 2019-01-31 DIAGNOSIS — D509 Iron deficiency anemia, unspecified: Secondary | ICD-10-CM | POA: Diagnosis not present

## 2019-01-31 DIAGNOSIS — D631 Anemia in chronic kidney disease: Secondary | ICD-10-CM | POA: Diagnosis not present

## 2019-01-31 DIAGNOSIS — N186 End stage renal disease: Secondary | ICD-10-CM | POA: Diagnosis not present

## 2019-01-31 DIAGNOSIS — Z992 Dependence on renal dialysis: Secondary | ICD-10-CM | POA: Diagnosis not present

## 2019-01-31 DIAGNOSIS — N2581 Secondary hyperparathyroidism of renal origin: Secondary | ICD-10-CM | POA: Diagnosis not present

## 2019-02-01 ENCOUNTER — Other Ambulatory Visit
Admission: RE | Admit: 2019-02-01 | Discharge: 2019-02-01 | Disposition: A | Discharge status: Home / Self Care | Payer: Medicare Other | Source: Ambulatory Visit | Attending: Vascular Surgery | Admitting: Vascular Surgery

## 2019-02-01 ENCOUNTER — Other Ambulatory Visit: Payer: Self-pay

## 2019-02-01 ENCOUNTER — Encounter
Admission: RE | Disposition: A | Discharge status: Home / Self Care | Payer: Self-pay | Source: Home / Self Care | Attending: Vascular Surgery

## 2019-02-01 ENCOUNTER — Ambulatory Visit
Admission: RE | Admit: 2019-02-01 | Discharge: 2019-02-01 | Disposition: A | Discharge status: Home / Self Care | Payer: Medicare Other | Attending: Vascular Surgery | Admitting: Vascular Surgery

## 2019-02-01 DIAGNOSIS — E785 Hyperlipidemia, unspecified: Secondary | ICD-10-CM | POA: Insufficient documentation

## 2019-02-01 DIAGNOSIS — I132 Hypertensive heart and chronic kidney disease with heart failure and with stage 5 chronic kidney disease, or end stage renal disease: Secondary | ICD-10-CM | POA: Diagnosis not present

## 2019-02-01 DIAGNOSIS — F17210 Nicotine dependence, cigarettes, uncomplicated: Secondary | ICD-10-CM | POA: Diagnosis not present

## 2019-02-01 DIAGNOSIS — K219 Gastro-esophageal reflux disease without esophagitis: Secondary | ICD-10-CM | POA: Insufficient documentation

## 2019-02-01 DIAGNOSIS — T82858A Stenosis of vascular prosthetic devices, implants and grafts, initial encounter: Secondary | ICD-10-CM | POA: Insufficient documentation

## 2019-02-01 DIAGNOSIS — M069 Rheumatoid arthritis, unspecified: Secondary | ICD-10-CM | POA: Insufficient documentation

## 2019-02-01 DIAGNOSIS — Z992 Dependence on renal dialysis: Secondary | ICD-10-CM | POA: Diagnosis not present

## 2019-02-01 DIAGNOSIS — I2510 Atherosclerotic heart disease of native coronary artery without angina pectoris: Secondary | ICD-10-CM | POA: Insufficient documentation

## 2019-02-01 DIAGNOSIS — Z79899 Other long term (current) drug therapy: Secondary | ICD-10-CM | POA: Diagnosis not present

## 2019-02-01 DIAGNOSIS — F419 Anxiety disorder, unspecified: Secondary | ICD-10-CM | POA: Insufficient documentation

## 2019-02-01 DIAGNOSIS — N186 End stage renal disease: Secondary | ICD-10-CM | POA: Insufficient documentation

## 2019-02-01 DIAGNOSIS — Z1159 Encounter for screening for other viral diseases: Secondary | ICD-10-CM | POA: Diagnosis not present

## 2019-02-01 DIAGNOSIS — G4730 Sleep apnea, unspecified: Secondary | ICD-10-CM | POA: Insufficient documentation

## 2019-02-01 DIAGNOSIS — Y841 Kidney dialysis as the cause of abnormal reaction of the patient, or of later complication, without mention of misadventure at the time of the procedure: Secondary | ICD-10-CM | POA: Diagnosis not present

## 2019-02-01 DIAGNOSIS — Z7982 Long term (current) use of aspirin: Secondary | ICD-10-CM | POA: Insufficient documentation

## 2019-02-01 DIAGNOSIS — J449 Chronic obstructive pulmonary disease, unspecified: Secondary | ICD-10-CM | POA: Insufficient documentation

## 2019-02-01 DIAGNOSIS — I509 Heart failure, unspecified: Secondary | ICD-10-CM | POA: Insufficient documentation

## 2019-02-01 HISTORY — PX: A/V SHUNTOGRAM: CATH118297

## 2019-02-01 LAB — SARS CORONAVIRUS 2 BY RT PCR (HOSPITAL ORDER, PERFORMED IN ~~LOC~~ HOSPITAL LAB): SARS Coronavirus 2: NEGATIVE

## 2019-02-01 LAB — POTASSIUM (ARMC VASCULAR LAB ONLY): Potassium (ARMC vascular lab): 4.9 (ref 3.5–5.1)

## 2019-02-01 SURGERY — A/V SHUNTOGRAM
Anesthesia: Moderate Sedation | Laterality: Left

## 2019-02-01 MED ORDER — HEPARIN SODIUM (PORCINE) 1000 UNIT/ML IJ SOLN
INTRAMUSCULAR | Status: AC
  Filled 2019-02-01: qty 1

## 2019-02-01 MED ORDER — MIDAZOLAM HCL 2 MG/2ML IJ SOLN
INTRAMUSCULAR | Status: AC
  Filled 2019-02-01: qty 2

## 2019-02-01 MED ORDER — FENTANYL CITRATE (PF) 100 MCG/2ML IJ SOLN
INTRAMUSCULAR | Status: AC
  Filled 2019-02-01: qty 2

## 2019-02-01 MED ORDER — HYDRALAZINE HCL 20 MG/ML IJ SOLN
INTRAMUSCULAR | Status: AC
  Administered 2019-02-01: 11:00:00 10 mg via INTRAVENOUS
  Filled 2019-02-01: qty 1

## 2019-02-01 MED ORDER — FENTANYL CITRATE (PF) 100 MCG/2ML IJ SOLN
INTRAMUSCULAR | Status: DC | PRN
  Administered 2019-02-01 (×2): 25 ug via INTRAVENOUS
  Administered 2019-02-01: 50 ug via INTRAVENOUS

## 2019-02-01 MED ORDER — MIDAZOLAM HCL 2 MG/2ML IJ SOLN
INTRAMUSCULAR | Status: DC | PRN
  Administered 2019-02-01: 1 mg via INTRAVENOUS
  Administered 2019-02-01: 2 mg via INTRAVENOUS
  Administered 2019-02-01: 1 mg via INTRAVENOUS

## 2019-02-01 MED ORDER — SODIUM CHLORIDE 0.9 % IV SOLN
INTRAVENOUS | Status: DC
  Administered 2019-02-01: 10:00:00 via INTRAVENOUS

## 2019-02-01 MED ORDER — DIPHENHYDRAMINE HCL 50 MG/ML IJ SOLN: 50.0000 mg | Freq: Once | INTRAMUSCULAR | Status: DC | PRN

## 2019-02-01 MED ORDER — MIDAZOLAM HCL 2 MG/ML PO SYRP: 8.0000 mg | ORAL_SOLUTION | Freq: Once | ORAL | Status: DC | PRN

## 2019-02-01 MED ORDER — IOHEXOL 300 MG/ML  SOLN
INTRAMUSCULAR | Status: DC | PRN
  Administered 2019-02-01: 35 mL via INTRAVENOUS

## 2019-02-01 MED ORDER — HYDRALAZINE HCL 20 MG/ML IJ SOLN
10.0000 mg | Freq: Once | INTRAMUSCULAR | Status: AC
  Administered 2019-02-01: 11:00:00 10 mg via INTRAVENOUS

## 2019-02-01 MED ORDER — CEFAZOLIN SODIUM-DEXTROSE 1-4 GM/50ML-% IV SOLN
INTRAVENOUS | Status: AC
  Filled 2019-02-01: qty 50

## 2019-02-01 MED ORDER — HEPARIN SODIUM (PORCINE) 1000 UNIT/ML IJ SOLN
INTRAMUSCULAR | Status: DC | PRN
  Administered 2019-02-01: 3000 [IU] via INTRAVENOUS

## 2019-02-01 MED ORDER — FAMOTIDINE 20 MG PO TABS: 40.0000 mg | ORAL_TABLET | Freq: Once | ORAL | Status: DC | PRN

## 2019-02-01 MED ORDER — ONDANSETRON HCL 4 MG/2ML IJ SOLN: 4.0000 mg | Freq: Four times a day (QID) | INTRAMUSCULAR | Status: DC | PRN

## 2019-02-01 MED ORDER — LIDOCAINE-EPINEPHRINE (PF) 1 %-1:200000 IJ SOLN
INTRAMUSCULAR | Status: AC
  Filled 2019-02-01: qty 30

## 2019-02-01 MED ORDER — HYDROMORPHONE HCL 1 MG/ML IJ SOLN: 1.0000 mg | Freq: Once | INTRAMUSCULAR | Status: DC | PRN

## 2019-02-01 MED ORDER — CEFAZOLIN SODIUM-DEXTROSE 1-4 GM/50ML-% IV SOLN
1.0000 g | Freq: Once | INTRAVENOUS | Status: AC
  Administered 2019-02-01: 12:00:00 1 g via INTRAVENOUS
  Filled 2019-02-01: qty 50

## 2019-02-01 MED ORDER — METHYLPREDNISOLONE SODIUM SUCC 125 MG IJ SOLR: 125.0000 mg | Freq: Once | INTRAMUSCULAR | Status: DC | PRN

## 2019-02-01 SURGICAL SUPPLY — 13 items
BALLN LUTONIX AV 7X60X75 (BALLOONS) ×3
BALLN ULTRVRSE 7X60X75C (BALLOONS) ×3
BALLOON LUTONIX AV 7X60X75 (BALLOONS) ×1 IMPLANT
BALLOON ULTRVRSE 7X60X75C (BALLOONS) ×1 IMPLANT
CANNULA 5F STIFF (CANNULA) ×3 IMPLANT
CATH BEACON 5 .035 40 KMP TP (CATHETERS) ×1 IMPLANT
CATH BEACON 5 .038 40 KMP TP (CATHETERS) ×2
DEVICE PRESTO INFLATION (MISCELLANEOUS) ×3 IMPLANT
DRAPE BRACHIAL (DRAPES) ×3 IMPLANT
PACK ANGIOGRAPHY (CUSTOM PROCEDURE TRAY) ×3 IMPLANT
SHEATH BRITE TIP 6FRX5.5 (SHEATH) ×3 IMPLANT
SUT MNCRL AB 4-0 PS2 18 (SUTURE) ×3 IMPLANT
WIRE MAGIC TOR.035 180C (WIRE) ×3 IMPLANT

## 2019-02-01 NOTE — Discharge Instructions (Signed)
Dialysis Shuntogram, Care After This sheet gives you information about how to care for yourself after your procedure. Your health care provider may also give you more specific instructions. If you have problems or questions, contact your health care provider. What can I expect after the procedure? After the procedure, it is common to have:  A small amount of discomfort in the area where the small, thin tube (catheter) was placed for the procedure.  A small amount of bruising around the fistula.  Sleepiness and tiredness (fatigue). Follow these instructions at home: Activity   Rest at home and do not lift anything that is heavier than 5 lb (2.3 kg) on the day after your procedure.  Return to your normal activities as told by your health care provider. Ask your health care provider what activities are safe for you.  Do not drive or use heavy machinery while taking prescription pain medicine.  Do not drive for 24 hours if you were given a medicine to help you relax (sedative) during your procedure. Medicines   Take over-the-counter and prescription medicines only as told by your health care provider. Puncture site care  Follow instructions from your health care provider about how to take care of the site where catheters were inserted. Make sure you: ? Wash your hands with soap and water before you change your bandage (dressing). If soap and water are not available, use hand sanitizer. ? Change your dressing as told by your health care provider. ? Leave stitches (sutures), skin glue, or adhesive strips in place. These skin closures may need to stay in place for 2 weeks or longer. If adhesive strip edges start to loosen and curl up, you may trim the loose edges. Do not remove adhesive strips completely unless your health care provider tells you to do that.  Check your puncture area every day for signs of infection. Check for: ? Redness, swelling, or pain. ? Fluid or  blood. ? Warmth. ? Pus or a bad smell. General instructions  Do not take baths, swim, or use a hot tub until your health care provider approves. Ask your health care provider if you may take showers. You may only be allowed to take sponge baths.  Monitor your dialysis fistula closely. Check to make sure that you can feel a vibration or buzz (a thrill) when you put your fingers over the fistula.  Prevent damage to your graft or fistula: ? Do not wear tight-fitting clothing or jewelry on the arm or leg that has your graft or fistula. ? Tell all your health care providers that you have a dialysis fistula or graft. ? Do not allow blood draws, IVs, or blood pressure readings to be done in the arm that has your fistula or graft. ? Do not allow flu shots or vaccinations in the arm with your fistula or graft.  Keep all follow-up visits as told by your health care provider. This is important. Contact a health care provider if:  You have redness, swelling, or pain at the site where the catheter was put in.  You have fluid or blood coming from the catheter site.  The catheter site feels warm to the touch.  You have pus or a bad smell coming from the catheter site.  You have a fever or chills. Get help right away if:  You feel weak.  You have trouble balancing.  You have trouble moving your arms or legs.  You have problems with your speech or vision.  You  can no longer feel a vibration or buzz when you put your fingers over your dialysis fistula.  The limb that was used for the procedure: ? Swells. ? Is painful. ? Is cold. ? Is discolored, such as blue or pale white.  You have chest pain or shortness of breath. Summary  After a dialysis fistulogram, it is common to have a small amount of discomfort or bruising in the area where the small, thin tube (catheter) was placed.  Rest at home on the day after your procedure. Return to your normal activities as told by your health care  provider.  Take over-the-counter and prescription medicines only as told by your health care provider.  Follow instructions from your health care provider about how to take care of the site where the catheter was inserted.  Keep all follow-up visits as told by your health care provider. This information is not intended to replace advice given to you by your health care provider. Make sure you discuss any questions you have with your health care provider. Document Released: 01/14/2014 Document Revised: 09/30/2017 Document Reviewed: 09/30/2017 Elsevier Interactive Patient Education  2019 [employer]. Moderate Conscious Sedation, Adult, Care After These instructions provide you with information about caring for yourself after your procedure. Your health care provider may also give you more specific instructions. Your treatment has been planned according to current medical practices, but problems sometimes occur. Call your health care provider if you have any problems or questions after your procedure. What can I expect after the procedure? After your procedure, it is common:  To feel sleepy for several hours.  To feel clumsy and have poor balance for several hours.  To have poor judgment for several hours.  To vomit if you eat too soon. Follow these instructions at home: For at least 24 hours after the procedure:   Do not: ? Participate in activities where you could fall or become injured. ? Drive. ? Use heavy machinery. ? Drink alcohol. ? Take sleeping pills or medicines that cause drowsiness. ? Make important decisions or sign legal documents. ? Take care of children on your own.  Rest. Eating and drinking  Follow the diet recommended by your health care provider.  If you vomit: ? Drink water, juice, or soup when you can drink without vomiting. ? Make sure you have little or no nausea before eating solid foods. General instructions  Have a responsible adult stay with you  until you are awake and alert.  Take over-the-counter and prescription medicines only as told by your health care provider.  If you smoke, do not smoke without supervision.  Keep all follow-up visits as told by your health care provider. This is important. Contact a health care provider if:  You keep feeling nauseous or you keep vomiting.  You feel light-headed.  You develop a rash.  You have a fever. Get help right away if:  You have trouble breathing. This information is not intended to replace advice given to you by your health care provider. Make sure you discuss any questions you have with your health care provider. Document Released: 06/20/2013 Document Revised: 02/02/2016 Document Reviewed: 12/20/2015 Elsevier Interactive Patient Education  2019 [employer].

## 2019-02-01 NOTE — H&P (Signed)
[facility] History & Physical Update  The patient was interviewed and re-examined.  The patient's previous History and Physical has been reviewed and is unchanged.  There is no change in the plan of care. We plan to proceed with the scheduled procedure.  Leotis Pain, MD  02/01/2019, 10:18 AM

## 2019-02-01 NOTE — Op Note (Signed)
[facility]    OPERATIVE NOTE   PROCEDURE: 1.   Left radial basilic arteriovenous fistula and Jump graft revision cannulation under ultrasound guidance 2.   Left arm fistulagram including central venogram 3.   Percutaneous transluminal angioplasty of the distal forearm AV fistula to jump graft anastomosis with 7 mm diameter Lutonix drug-coated angioplasty balloon 4.   Percutaneous transluminal angioplasty of the proximal forearm AV fistula to jump graft anastomosis with 7 mm diameter angioplasty balloon  PRE-OPERATIVE DIAGNOSIS: 1. ESRD 2. 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 75 to 80% stenosis of the distal forearm AV fistula to jump graft anastomosis.  65 to 70% stenosis of the proximal forearm AV fistula to jump graft anastomosis.  The remainder of the outflow through the basilic vein in the upper arm was widely patent as was the central venous circulation  SPECIMEN(S):  None  CONTRAST: 35 cc  FLUORO TIME: 3.3 minutes  MODERATE CONSCIOUS SEDATION TIME: Approximately 20 minutes with 4 mg of Versed and 100 mcg of Fentanyl   INDICATIONS: Daryl Weeks is a 58 y.o. male who presents with malfunctioning recently revised left radial basilic arteriovenous fistula with a jump graft.  Noninvasive studies showed stenosis more prominent at the distal forearm anastomosis with reduced flow volumes.  The patient is scheduled for left arm fistulagram.  The patient is aware the risks include but are not limited to: bleeding, infection, thrombosis of the cannulated access, and possible anaphylactic reaction to the contrast.  The patient is aware of the risks of the procedure and elects to proceed forward.  DESCRIPTION: After full informed written consent  was obtained, the patient was brought back to the angiography suite and placed supine upon the angiography table.  The patient was connected to monitoring equipment. Moderate conscious sedation was administered with a face to face encounter with the patient throughout the procedure with my supervision of the RN administering medicines and monitoring the patient's vital signs and mental status throughout from the start of the procedure until the patient was taken to the recovery room. The left arm was prepped and draped in the standard fashion for a percutaneous access intervention.  Under ultrasound guidance, the left upper arm basilic vein to be able to access the jump graft and the arteriovenous fistula was cannulated with a micropuncture needle under direct ultrasound guidance in a retrograde fashion where it was patent and a permanent image was performed.  The microwire was advanced into the fistula and the needle was exchanged for the a microsheath.  I then upsized to a 6 Fr Sheath and advanced a Kumpe catheter and a Magic torque wire to the original radial basilic anastomosis and imaging was performed.  Hand injections were completed to image the access including the central venous system. This demonstrated 75 to 80% stenosis of the distal forearm AV fistula to jump graft anastomosis.  65 to 70% stenosis of the proximal forearm AV fistula to jump graft anastomosis.  The remainder of the outflow through the basilic vein in the upper arm was widely patent as was the central venous circulation.  Based on the images, this patient will need [place] to both areas. I then gave the patient 3000 units of intravenous heparin.  I then crossed the stenosis with a  Magic Tourqe wire.  Based on the imaging, a 7 mm x 6 cm Lutonix drug-coated angioplasty balloon was selected.  The balloon was centered around the distal forearm AV fistula to jump graft anastomosis stenosis and inflated to 8 ATM for 1 minute(s).  On completion  imaging, a 20-25 % residual stenosis was present.   I then addressed the proximal forearm AV fistula to jump graft anastomosis stenosis.  A 7 mm diameter by 6 cm length angioplasty balloon was then inflated to approximately 8 atm for 1 minute to address the more proximal forearm stenosis.  Completion imaging showed only about a 20% residual stenosis after angioplasty  Based on the completion imaging, no further intervention is necessary.  The wire and balloon were removed from the sheath.  A 4-0 Monocryl purse-string suture was sewn around the sheath.  The sheath was removed while tying down the suture.  A sterile bandage was applied to the puncture site.  COMPLICATIONS: None  CONDITION: Stable   Leotis Pain  02/01/2019 12:19 PM   This note was created with Dragon Medical transcription system. Any errors in dictation are purely unintentional.

## 2019-02-01 NOTE — Progress Notes (Signed)
Dr. Lucky Cowboy at bedside and made aware of BP 195/100. Informed patient to take 0.1 mg clonidine when he gets home. Patient is agreeable.

## 2019-02-02 DIAGNOSIS — N2581 Secondary hyperparathyroidism of renal origin: Secondary | ICD-10-CM | POA: Diagnosis not present

## 2019-02-02 DIAGNOSIS — Z992 Dependence on renal dialysis: Secondary | ICD-10-CM | POA: Diagnosis not present

## 2019-02-02 DIAGNOSIS — N186 End stage renal disease: Secondary | ICD-10-CM | POA: Diagnosis not present

## 2019-02-02 DIAGNOSIS — D509 Iron deficiency anemia, unspecified: Secondary | ICD-10-CM | POA: Diagnosis not present

## 2019-02-02 DIAGNOSIS — D631 Anemia in chronic kidney disease: Secondary | ICD-10-CM | POA: Diagnosis not present

## 2019-02-05 DIAGNOSIS — N2581 Secondary hyperparathyroidism of renal origin: Secondary | ICD-10-CM | POA: Diagnosis not present

## 2019-02-05 DIAGNOSIS — Z992 Dependence on renal dialysis: Secondary | ICD-10-CM | POA: Diagnosis not present

## 2019-02-05 DIAGNOSIS — D509 Iron deficiency anemia, unspecified: Secondary | ICD-10-CM | POA: Diagnosis not present

## 2019-02-05 DIAGNOSIS — D631 Anemia in chronic kidney disease: Secondary | ICD-10-CM | POA: Diagnosis not present

## 2019-02-05 DIAGNOSIS — N186 End stage renal disease: Secondary | ICD-10-CM | POA: Diagnosis not present

## 2019-02-07 DIAGNOSIS — D631 Anemia in chronic kidney disease: Secondary | ICD-10-CM | POA: Diagnosis not present

## 2019-02-07 DIAGNOSIS — Z992 Dependence on renal dialysis: Secondary | ICD-10-CM | POA: Diagnosis not present

## 2019-02-07 DIAGNOSIS — N186 End stage renal disease: Secondary | ICD-10-CM | POA: Diagnosis not present

## 2019-02-07 DIAGNOSIS — D509 Iron deficiency anemia, unspecified: Secondary | ICD-10-CM | POA: Diagnosis not present

## 2019-02-07 DIAGNOSIS — N2581 Secondary hyperparathyroidism of renal origin: Secondary | ICD-10-CM | POA: Diagnosis not present

## 2019-02-08 ENCOUNTER — Encounter (INDEPENDENT_AMBULATORY_CARE_PROVIDER_SITE_OTHER): Payer: Self-pay

## 2019-02-09 DIAGNOSIS — D631 Anemia in chronic kidney disease: Secondary | ICD-10-CM | POA: Diagnosis not present

## 2019-02-09 DIAGNOSIS — Z992 Dependence on renal dialysis: Secondary | ICD-10-CM | POA: Diagnosis not present

## 2019-02-09 DIAGNOSIS — N2581 Secondary hyperparathyroidism of renal origin: Secondary | ICD-10-CM | POA: Diagnosis not present

## 2019-02-09 DIAGNOSIS — N186 End stage renal disease: Secondary | ICD-10-CM | POA: Diagnosis not present

## 2019-02-09 DIAGNOSIS — D509 Iron deficiency anemia, unspecified: Secondary | ICD-10-CM | POA: Diagnosis not present

## 2019-02-11 ENCOUNTER — Other Ambulatory Visit (INDEPENDENT_AMBULATORY_CARE_PROVIDER_SITE_OTHER): Payer: Self-pay | Admitting: Nurse Practitioner

## 2019-02-11 DIAGNOSIS — Z992 Dependence on renal dialysis: Secondary | ICD-10-CM | POA: Diagnosis not present

## 2019-02-11 DIAGNOSIS — N186 End stage renal disease: Secondary | ICD-10-CM | POA: Diagnosis not present

## 2019-02-12 DIAGNOSIS — N186 End stage renal disease: Secondary | ICD-10-CM | POA: Diagnosis not present

## 2019-02-12 DIAGNOSIS — E8779 Other fluid overload: Secondary | ICD-10-CM | POA: Diagnosis not present

## 2019-02-12 DIAGNOSIS — Z992 Dependence on renal dialysis: Secondary | ICD-10-CM | POA: Diagnosis not present

## 2019-02-12 DIAGNOSIS — N2581 Secondary hyperparathyroidism of renal origin: Secondary | ICD-10-CM | POA: Diagnosis not present

## 2019-02-12 DIAGNOSIS — D509 Iron deficiency anemia, unspecified: Secondary | ICD-10-CM | POA: Diagnosis not present

## 2019-02-12 DIAGNOSIS — D631 Anemia in chronic kidney disease: Secondary | ICD-10-CM | POA: Diagnosis not present

## 2019-02-12 NOTE — H&P (Signed)
See Daryl Weeks Progress note from 5/14

## 2019-02-14 ENCOUNTER — Other Ambulatory Visit
Admission: RE | Admit: 2019-02-14 | Discharge: 2019-02-14 | Disposition: A | Discharge status: Home / Self Care | Payer: Medicare Other | Source: Ambulatory Visit | Attending: Vascular Surgery | Admitting: Vascular Surgery

## 2019-02-14 ENCOUNTER — Other Ambulatory Visit: Payer: Self-pay

## 2019-02-14 DIAGNOSIS — Z1159 Encounter for screening for other viral diseases: Secondary | ICD-10-CM | POA: Insufficient documentation

## 2019-02-14 DIAGNOSIS — N2581 Secondary hyperparathyroidism of renal origin: Secondary | ICD-10-CM | POA: Diagnosis not present

## 2019-02-14 DIAGNOSIS — Z992 Dependence on renal dialysis: Secondary | ICD-10-CM | POA: Diagnosis not present

## 2019-02-14 DIAGNOSIS — D509 Iron deficiency anemia, unspecified: Secondary | ICD-10-CM | POA: Diagnosis not present

## 2019-02-14 DIAGNOSIS — E8779 Other fluid overload: Secondary | ICD-10-CM | POA: Diagnosis not present

## 2019-02-14 DIAGNOSIS — N186 End stage renal disease: Secondary | ICD-10-CM | POA: Diagnosis not present

## 2019-02-14 DIAGNOSIS — D631 Anemia in chronic kidney disease: Secondary | ICD-10-CM | POA: Diagnosis not present

## 2019-02-14 LAB — SARS CORONAVIRUS 2 BY RT PCR (HOSPITAL ORDER, PERFORMED IN ~~LOC~~ HOSPITAL LAB): SARS Coronavirus 2: NEGATIVE

## 2019-02-14 MED ORDER — CEFAZOLIN SODIUM-DEXTROSE 1-4 GM/50ML-% IV SOLN: 1.0000 g | Freq: Once | INTRAVENOUS | Status: DC

## 2019-02-15 ENCOUNTER — Ambulatory Visit
Admission: RE | Admit: 2019-02-15 | Discharge: 2019-02-15 | Disposition: A | Discharge status: Home / Self Care | Payer: Medicare Other | Attending: Vascular Surgery | Admitting: Vascular Surgery

## 2019-02-15 ENCOUNTER — Encounter
Admission: RE | Disposition: A | Discharge status: Home / Self Care | Payer: Self-pay | Source: Home / Self Care | Attending: Vascular Surgery

## 2019-02-15 ENCOUNTER — Other Ambulatory Visit: Payer: Self-pay

## 2019-02-15 DIAGNOSIS — Z452 Encounter for adjustment and management of vascular access device: Secondary | ICD-10-CM | POA: Diagnosis not present

## 2019-02-15 DIAGNOSIS — Z992 Dependence on renal dialysis: Secondary | ICD-10-CM | POA: Diagnosis not present

## 2019-02-15 DIAGNOSIS — N186 End stage renal disease: Secondary | ICD-10-CM | POA: Insufficient documentation

## 2019-02-15 HISTORY — PX: DIALYSIS/PERMA CATHETER REMOVAL: CATH118289

## 2019-02-15 SURGERY — DIALYSIS/PERMA CATHETER REMOVAL
Anesthesia: LOCAL

## 2019-02-15 MED ORDER — ONDANSETRON HCL 4 MG/2ML IJ SOLN: 4.0000 mg | Freq: Four times a day (QID) | INTRAMUSCULAR | Status: DC | PRN

## 2019-02-15 MED ORDER — HYDROMORPHONE HCL 1 MG/ML IJ SOLN: 1.0000 mg | Freq: Once | INTRAMUSCULAR | Status: DC | PRN

## 2019-02-15 MED ORDER — SODIUM CHLORIDE 0.9 % IV SOLN: INTRAVENOUS | Status: DC

## 2019-02-15 MED ORDER — LIDOCAINE-EPINEPHRINE (PF) 1 %-1:200000 IJ SOLN
INTRAMUSCULAR | Status: AC
  Filled 2019-02-15: qty 10

## 2019-02-15 SURGICAL SUPPLY — 2 items
FORCEPS HALSTEAD CVD 5IN STRL (INSTRUMENTS) ×2 IMPLANT
TRAY LACERAT/PLASTIC (MISCELLANEOUS) ×2 IMPLANT

## 2019-02-15 NOTE — H&P (Signed)
[facility] Admission History & Physical  MRN : [id number]  Daryl Weeks is a 58 y.o. (08-04-1961) male who presents with chief complaint of No chief complaint on file. [name]  History of Present Illness: I am asked to evaluate the patient by the dialysis center. The patient was sent here because they have a nonfunctioning tunneled catheter and a functioning left arm AVF.  The patient reports they're not been any problems with any of their dialysis runs. They are reporting good flows with good parameters at dialysis.  Patient denies pain or tenderness overlying the access.  There is no pain with dialysis.  The patient denies hand pain or finger pain consistent with steal syndrome.  No fevers or chills while on dialysis.   No current facility-administered medications for this encounter.     Past Medical History:  Diagnosis Date  . Anemia   . Anxiety   . Arthritis   . CHF (congestive heart failure) ([place])   . Chronic kidney disease on chronic dialysis ([place])    dialysis on m, w, f  . Colon polyps   . COPD (chronic obstructive pulmonary disease) ([place])   . Coronary artery disease    Non-ST elevation myocardial infarction in February of 2015. In the setting of hypertensive urgency and blood loss anemia. Cardiac catheterization showed significant three-vessel coronary artery disease. EF was 55% by echo. He underwent CABG at [facility] on May 19 with LIMA to LAD, Sequential SVG to OM1 and OM2, and SVG to RCA  . End stage renal disease ([place])    m-w-f   -davida Elbert  . End stage renal disease ([place])   . ETOH abuse   . GERD (gastroesophageal reflux disease)    hx  . Hyperlipidemia   . Hypertension   . IgA nephropathy   . Lower GI bleed    Due to colon polyps. Status post resection of 14 polyps  . Non-ST elevation MI (NSTEMI) ([place])   . Pneumonia    hx  . Rheumatoid arthritis ([place])   . Shortness of breath   . Sleep apnea   . Tobacco abuse   . Tobacco abuse      Past Surgical History:  Procedure Laterality Date  . A/V FISTULAGRAM Left 10/28/2016   Procedure: A/V Fistulagram;  Surgeon: [name], MD;  Location: [facility];  Service: Cardiovascular;  Laterality: Left;  . A/V FISTULAGRAM Left 12/07/2018   Procedure: A/V FISTULAGRAM;  Surgeon: [name], MD;  Location: [place] [facility];  Service: Cardiovascular;  Laterality: Left;  . A/V SHUNT INTERVENTION N/A 10/28/2016   Procedure: A/V Shunt Intervention;  Surgeon: [name], MD;  Location: [place] [facility];  Service: Cardiovascular;  Laterality: N/A;  . A/V SHUNTOGRAM Left 02/01/2019   Procedure: A/V SHUNTOGRAM;  Surgeon: [name], MD;  Location: [place] [facility];  Service: Cardiovascular;  Laterality: Left;  . AV FISTULA PLACEMENT    . CARDIAC CATHETERIZATION     RCA 90% and calcified mid LAD 80% Stenosis  . CORONARY ARTERY BYPASS GRAFT N/A 11/29/2013   Procedure: CORONARY ARTERY BYPASS GRAFTING (CABG) x 4 using endoscopically harvested right saphenous vein and left internal mammary artery;  Surgeon: [name], MD;  Location: [facility];  Service: Open Heart Surgery;  Laterality: N/A;  . dialysis catheterr  2/15  . INSERTION OF DIALYSIS CATHETER N/A 12/14/2018   Procedure: INSERTION OF DIALYSIS CATHETER ( PERMCATH );  Surgeon: Leotis Pain  S, MD;  Location: [facility];  Service: Vascular;  Laterality: N/A;  . INTRAOPERATIVE TRANSESOPHAGEAL ECHOCARDIOGRAM N/A 11/29/2013   Procedure: INTRAOPERATIVE TRANSESOPHAGEAL ECHOCARDIOGRAM;  Surgeon: [name], MD;  Location: [facility];  Service: Open Heart Surgery;  Laterality: N/A;  . PERIPHERAL VASCULAR CATHETERIZATION N/A 06/12/2015   Procedure: A/V Shuntogram/Fistulagram;  Surgeon: [name], MD;  Location: [facility];  Service: Cardiovascular;  Laterality: N/A;  . PERIPHERAL VASCULAR CATHETERIZATION Left 06/12/2015   Procedure: A/V Shunt Intervention;  Surgeon: [name], MD;  Location: [facility];  Service:  Cardiovascular;  Laterality: Left;  . RENAL BIOPSY Left 14  . REVISON OF ARTERIOVENOUS FISTULA Left 12/14/2018   Procedure: REVISON OF ARTERIOVENOUS FISTULA;  Surgeon: [name], MD;  Location: [facility];  Service: Vascular;  Laterality: Left;    Social History Social History   Tobacco Use  . Smoking status: Current Every Day Smoker    Packs/day: 0.50    Years: 32.00    Pack years: 16.00    Types: Cigarettes  . Smokeless tobacco: Never Used  . Tobacco comment: daily  Substance Use Topics  . Alcohol use: Yes    Alcohol/week: 6.0 standard drinks    Types: 6 Cans of beer per week    Comment: weekly  . Drug use: No    Family History Family History  Problem Relation Age of Onset  . Heart disease Father   . Heart disease Brother   . Healthy Sister   . Stroke Neg Hx     No family history of bleeding or clotting disorders, autoimmune disease or porphyria  Allergies  Allergen Reactions  . Lisinopril      REVIEW OF SYSTEMS (Negative unless checked)  Constitutional: [] Weight loss  [] Fever  [] Chills Cardiac: [] Chest pain   [] Chest pressure   [x] Palpitations   [] Shortness of breath when laying flat   [] Shortness of breath at rest   [x] Shortness of breath with exertion. Vascular:  [] Pain in legs with walking   [] Pain in legs at rest   [] Pain in legs when laying flat   [] Claudication   [] Pain in feet when walking  [] Pain in feet at rest  [] Pain in feet when laying flat   [] History of DVT   [] Phlebitis   [] Swelling in legs   [] Varicose veins   [] Non-healing ulcers Pulmonary:   [] Uses home oxygen   [] Productive cough   [] Hemoptysis   [] Wheeze  [x] COPD   [] Asthma Neurologic:  [] Dizziness  [] Blackouts   [] Seizures   [] History of stroke   [] History of TIA  [] Aphasia   [] Temporary blindness   [] Dysphagia   [] Weakness or numbness in arms   [] Weakness or numbness in legs Musculoskeletal:  [x] Arthritis   [] Joint swelling   [] Joint pain   [] Low back pain Hematologic:  [] Easy bruising  [] Easy  bleeding   [] Hypercoagulable state   [] Anemic  [] Hepatitis Gastrointestinal:  [] Blood in stool   [] Vomiting blood  [x] Gastroesophageal reflux/heartburn   [] Difficulty swallowing. Genitourinary:  [x] Chronic kidney disease   [] Difficult urination  [] Frequent urination  [] Burning with urination   [] Blood in urine Skin:  [] Rashes   [] Ulcers   [] Wounds Psychological:  [] History of anxiety   []  History of major depression.  Physical Examination  Vitals:   02/15/19 1122  BP: (!) 165/81  Pulse: 60  Resp: 16  Temp: 98.4 F (36.9 C)  TempSrc: Oral  Weight: 73.5 kg  Height: 5\' 7"  (1.702 m)   Body mass  index is 25.38 kg/m. Gen: WD/WN, NAD Head: [place]/AT, No temporalis wasting.  Ear/Nose/Throat: Hearing grossly intact, nares w/o erythema or drainage, oropharynx w/o Erythema/Exudate,  Eyes: Conjunctiva clear, sclera non-icteric Neck: Trachea midline.  No JVD.  Pulmonary:  Good air movement, respirations not labored, no use of accessory muscles.  Cardiac: RRR, normal S1, S2. Vascular: good thrill left arm access Vessel Right Left  Radial Palpable Palpable   Musculoskeletal: M/S 5/5 throughout.  Extremities without ischemic changes.  No deformity or atrophy.  Neurologic: Sensation grossly intact in extremities.  Symmetrical.  Speech is fluent. Motor exam as listed above. Psychiatric: Judgment intact, Mood & affect appropriate for pt's clinical situation. Dermatologic: No rashes or ulcers noted.  No cellulitis or open wounds.    CBC Lab Results  Component Value Date   WBC 5.3 12/14/2018   HGB 10.6 (L) 12/14/2018   HCT 32.2 (L) 12/14/2018   MCV 104.2 (H) 12/14/2018   PLT 203 12/14/2018    BMET    Component Value Date/Time   NA 132 (L) 12/14/2018 1045   NA 132 (L) 06/24/2014 0903   K 4.6 12/14/2018 1045   K 4.8 07/11/2014 1046   CL 94 (L) 12/14/2018 1045   CL 96 (L) 06/24/2014 0903   CO2 28 12/14/2018 1045   CO2 21 06/24/2014 0903   GLUCOSE 109 (H) 12/14/2018 1045   GLUCOSE 92  06/24/2014 0903   BUN 22 (H) 12/14/2018 1045   BUN 60 (H) 06/24/2014 0903   CREATININE 7.76 (H) 12/14/2018 1045   CREATININE 9.10 (H) 06/24/2014 0903   CALCIUM 8.6 (L) 12/14/2018 1045   CALCIUM 8.8 06/24/2014 0903   GFRNONAA 7 (L) 12/14/2018 1045   GFRNONAA 7 (L) 06/24/2014 0903   GFRNONAA 11 (L) 01/14/2014 1051   GFRAA 8 (L) 12/14/2018 1045   GFRAA 8 (L) 06/24/2014 0903   GFRAA 12 (L) 01/14/2014 1051   CrCl cannot be calculated (Patient's most recent lab result is older than the maximum 21 days allowed.).  COAG Lab Results  Component Value Date   INR 1.0 12/14/2018   INR 1.31 11/29/2013   INR 0.99 11/27/2013    Radiology Vas US Duplex Dialysis Access (avf, Avg)  Result Date: 01/26/2019 DIALYSIS ACCESS Reason for Exam: S/p new artegraft forearm. Access Site: Left Upper Extremity. Access Type: Radial basilic. History: 3/26 New artegraft with ligation of previous aneurysmal avf in forearm. Comparison Study: 11/28/2018 Performing Technologist: Concha Norway RVT  Examination Guidelines: A complete evaluation includes B-mode imaging, spectral Doppler, color Doppler, and power Doppler as needed of all accessible portions of each vessel. Unilateral testing is considered an integral part of a complete examination. Limited examinations for reoccurring indications may be performed as noted.  Findings: +--------------------+----------+-----------------+--------+ AVF                 PSV (cm/s)Flow Vol (mL/min)Comments +--------------------+----------+-----------------+--------+ Native artery inflow   235           575                +--------------------+----------+-----------------+--------+ AVF Anastomosis        724                              +--------------------+----------+-----------------+--------+  +---------------+----------+-------------+----------+---------+ OUTFLOW VEIN   PSV (cm/s)Diameter (cm)Depth (cm)Describe   +---------------+----------+-------------+----------+---------+ Subclavian vein    75                                     +---------------+----------+-------------+----------+---------+  Axillary vein      88                                     +---------------+----------+-------------+----------+---------+ Prox UA           366                                     +---------------+----------+-------------+----------+---------+ Mid UA            209                           artegraft +---------------+----------+-------------+----------+---------+ Dist UA           121                           artegraft +---------------+----------+-------------+----------+---------+ Prox Forearm       94                           artegraft +---------------+----------+-------------+----------+---------+ Mid Forearm       121                           artegraft +---------------+----------+-------------+----------+---------+ Dist Forearm      353                                     +---------------+----------+-------------+----------+---------+  +-----------------+-------------+---------+---------+----------+---------------+                  Diameter (cm)  Depth  BranchingPSV (cm/s)  Flow Volume                                   (cm)                         (ml/min)     +-----------------+-------------+---------+---------+----------+---------------+ Left dis rad art                                    57                    +-----------------+-------------+---------+---------+----------+---------------+ now shows                                                                 retrograde flow                                                           +-----------------+-------------+---------+---------+----------+---------------+  Summary: Patent radial basilic forearm AVF with ligated area of old aneurysmal forearm AVF and patent new Jump Artegraft from mid  forearm to mid upper arm. Significant high velocity flow at the arterial anastomosis site with velocities >700cm/s.  *See table(s) above for measurements and observations.  Diagnosing physician: Leotis Pain MD Electronically signed by Leotis Pain MD on 01/26/2019 at 12:57:05 PM.   --------------------------------------------------------------------------------   Final     Assessment/Plan 1.  Complication dialysis device:  Patient's Tunneled catheter is not being used. The patient has an extremity access that is functioning well. Therefore, the patient will undergo removal of the tunneled catheter under local anesthesia.  The risks and benefits were described to the patient.  All questions were answered. 2.  End-stage renal disease requiring hemodialysis:  Patient will continue dialysis therapy without further interruption 3.  Hypertension:  Patient will continue medical management; nephrology is following no changes in oral medications. 4.  Coronary artery disease:  EKG will be monitored. Nitrates will be used if needed. The patient's oral cardiac medications will be continued.    Leotis Pain, MD  02/15/2019 11:28 AM

## 2019-02-15 NOTE — Op Note (Signed)
Operative Note     Preoperative diagnosis:   1. ESRD with functional permanent access  Postoperative diagnosis:  1. ESRD with functional permanent access  Procedure:  Removal of right jugular Permcath  Surgeon:  Leotis Pain, MD  Anesthesia:  Local  EBL:  Minimal  Indication for the Procedure:  The patient has a functional permanent dialysis access and no longer needs their permcath.  This can be removed.  Risks and benefits are discussed and informed consent is obtained.  Description of the Procedure:  The patient's right neck, chest and existing catheter were sterilely prepped and draped. The area around the catheter was anesthetized copiously with 1% lidocaine. The catheter was dissected out with curved hemostats until the cuff was freed from the surrounding fibrous sheath. The fiber sheath was transected, and the catheter was then removed in its entirety using gentle traction. Pressure was held and sterile dressings were placed. The patient tolerated the procedure well and was taken to the recovery room in stable condition.     Leotis Pain  02/15/2019, 12:44 PM This note was created with Dragon Medical transcription system. Any errors in dictation are purely unintentional.

## 2019-02-15 NOTE — Discharge Instructions (Signed)
Incision Care, Adult °An incision is a surgical cut that is made through your skin. Most incisions are closed after surgery. Your incision may be closed with stitches (sutures), staples, skin glue, or adhesive strips. You may need to return to your health care provider to have sutures or staples removed. This may occur several days to several weeks after your surgery. The incision needs to be cared for properly to prevent infection. °How to care for your incision °Incision care ° °· Follow instructions from your health care provider about how to take care of your incision. Make sure you: °? Wash your hands with soap and water before you change the bandage (dressing). If soap and water are not available, use hand sanitizer. °? Change your dressing as told by your health care provider. °? Leave sutures, skin glue, or adhesive strips in place. These skin closures may need to stay in place for 2 weeks or longer. If adhesive strip edges start to loosen and curl up, you may trim the loose edges. Do not remove adhesive strips completely unless your health care provider tells you to do that. °· Check your incision area every day for signs of infection. Check for: °? More redness, swelling, or pain. °? More fluid or blood. °? Warmth. °? Pus or a bad smell. °· Ask your health care provider how to clean the incision. This may include: °? Using mild soap and water. °? Using a clean towel to pat the incision dry after cleaning it. °? Applying a cream or ointment. Do this only as told by your health care provider. °? Covering the incision with a clean dressing. °· Ask your health care provider when you can leave the incision uncovered. °· Do not take baths, swim, or use a hot tub until your health care provider approves. Ask your health care provider if you can take showers. You may only be allowed to take sponge baths for bathing. °Medicines °· If you were prescribed an antibiotic medicine, cream, or ointment, take or apply the  antibiotic as told by your health care provider. Do not stop taking or applying the antibiotic even if your condition improves. °· Take over-the-counter and prescription medicines only as told by your health care provider. °General instructions °· Limit movement around your incision to improve healing. °? Avoid straining, lifting, or exercise for the first month, or for as long as told by your health care provider. °? Follow instructions from your health care provider about returning to your normal activities. °? Ask your health care provider what activities are safe. °· Protect your incision from the sun when you are outside for the first 6 months, or for as long as told by your health care provider. Apply sunscreen around the scar or cover it up. °· Keep all follow-up visits as told by your health care provider. This is important. °Contact a health care provider if: °· Your have more redness, swelling, or pain around the incision. °· You have more fluid or blood coming from the incision. °· Your incision feels warm to the touch. °· You have pus or a bad smell coming from the incision. °· You have a fever or shaking chills. °· You are nauseous or you vomit. °· You are dizzy. °· Your sutures or staples come undone. °Get help right away if: °· You have a red streak coming from your incision. °· Your incision bleeds through the dressing and the bleeding does not stop with gentle pressure. °· The edges of   your incision open up and separate. °· You have severe pain. °· You have a rash. °· You are confused. °· You faint. °· You have trouble breathing and a fast heartbeat. °This information is not intended to replace advice given to you by your health care provider. Make sure you discuss any questions you have with your health care provider. °Document Released: 03/19/2005 Document Revised: 05/07/2016 Document Reviewed: 03/17/2016 °[employer]vier Interactive Patient Education © 2019 [employer] ° °

## 2019-02-16 ENCOUNTER — Encounter: Payer: Self-pay | Admitting: Vascular Surgery

## 2019-02-16 DIAGNOSIS — N2581 Secondary hyperparathyroidism of renal origin: Secondary | ICD-10-CM | POA: Diagnosis not present

## 2019-02-16 DIAGNOSIS — D509 Iron deficiency anemia, unspecified: Secondary | ICD-10-CM | POA: Diagnosis not present

## 2019-02-16 DIAGNOSIS — E8779 Other fluid overload: Secondary | ICD-10-CM | POA: Diagnosis not present

## 2019-02-16 DIAGNOSIS — Z992 Dependence on renal dialysis: Secondary | ICD-10-CM | POA: Diagnosis not present

## 2019-02-16 DIAGNOSIS — N186 End stage renal disease: Secondary | ICD-10-CM | POA: Diagnosis not present

## 2019-02-16 DIAGNOSIS — D631 Anemia in chronic kidney disease: Secondary | ICD-10-CM | POA: Diagnosis not present

## 2019-02-19 DIAGNOSIS — D509 Iron deficiency anemia, unspecified: Secondary | ICD-10-CM | POA: Diagnosis not present

## 2019-02-19 DIAGNOSIS — D631 Anemia in chronic kidney disease: Secondary | ICD-10-CM | POA: Diagnosis not present

## 2019-02-19 DIAGNOSIS — E8779 Other fluid overload: Secondary | ICD-10-CM | POA: Diagnosis not present

## 2019-02-19 DIAGNOSIS — N2581 Secondary hyperparathyroidism of renal origin: Secondary | ICD-10-CM | POA: Diagnosis not present

## 2019-02-19 DIAGNOSIS — Z992 Dependence on renal dialysis: Secondary | ICD-10-CM | POA: Diagnosis not present

## 2019-02-19 DIAGNOSIS — N186 End stage renal disease: Secondary | ICD-10-CM | POA: Diagnosis not present

## 2019-02-21 DIAGNOSIS — E8779 Other fluid overload: Secondary | ICD-10-CM | POA: Diagnosis not present

## 2019-02-21 DIAGNOSIS — N2581 Secondary hyperparathyroidism of renal origin: Secondary | ICD-10-CM | POA: Diagnosis not present

## 2019-02-21 DIAGNOSIS — D509 Iron deficiency anemia, unspecified: Secondary | ICD-10-CM | POA: Diagnosis not present

## 2019-02-21 DIAGNOSIS — Z992 Dependence on renal dialysis: Secondary | ICD-10-CM | POA: Diagnosis not present

## 2019-02-21 DIAGNOSIS — N186 End stage renal disease: Secondary | ICD-10-CM | POA: Diagnosis not present

## 2019-02-21 DIAGNOSIS — D631 Anemia in chronic kidney disease: Secondary | ICD-10-CM | POA: Diagnosis not present

## 2019-02-23 DIAGNOSIS — E8779 Other fluid overload: Secondary | ICD-10-CM | POA: Diagnosis not present

## 2019-02-23 DIAGNOSIS — Z992 Dependence on renal dialysis: Secondary | ICD-10-CM | POA: Diagnosis not present

## 2019-02-23 DIAGNOSIS — N186 End stage renal disease: Secondary | ICD-10-CM | POA: Diagnosis not present

## 2019-02-23 DIAGNOSIS — D509 Iron deficiency anemia, unspecified: Secondary | ICD-10-CM | POA: Diagnosis not present

## 2019-02-23 DIAGNOSIS — N2581 Secondary hyperparathyroidism of renal origin: Secondary | ICD-10-CM | POA: Diagnosis not present

## 2019-02-23 DIAGNOSIS — D631 Anemia in chronic kidney disease: Secondary | ICD-10-CM | POA: Diagnosis not present

## 2019-02-26 DIAGNOSIS — D509 Iron deficiency anemia, unspecified: Secondary | ICD-10-CM | POA: Diagnosis not present

## 2019-02-26 DIAGNOSIS — N186 End stage renal disease: Secondary | ICD-10-CM | POA: Diagnosis not present

## 2019-02-26 DIAGNOSIS — E8779 Other fluid overload: Secondary | ICD-10-CM | POA: Diagnosis not present

## 2019-02-26 DIAGNOSIS — D631 Anemia in chronic kidney disease: Secondary | ICD-10-CM | POA: Diagnosis not present

## 2019-02-26 DIAGNOSIS — Z992 Dependence on renal dialysis: Secondary | ICD-10-CM | POA: Diagnosis not present

## 2019-02-26 DIAGNOSIS — N2581 Secondary hyperparathyroidism of renal origin: Secondary | ICD-10-CM | POA: Diagnosis not present

## 2019-02-28 DIAGNOSIS — N186 End stage renal disease: Secondary | ICD-10-CM | POA: Diagnosis not present

## 2019-02-28 DIAGNOSIS — D631 Anemia in chronic kidney disease: Secondary | ICD-10-CM | POA: Diagnosis not present

## 2019-02-28 DIAGNOSIS — D509 Iron deficiency anemia, unspecified: Secondary | ICD-10-CM | POA: Diagnosis not present

## 2019-02-28 DIAGNOSIS — N2581 Secondary hyperparathyroidism of renal origin: Secondary | ICD-10-CM | POA: Diagnosis not present

## 2019-02-28 DIAGNOSIS — E8779 Other fluid overload: Secondary | ICD-10-CM | POA: Diagnosis not present

## 2019-02-28 DIAGNOSIS — Z992 Dependence on renal dialysis: Secondary | ICD-10-CM | POA: Diagnosis not present

## 2019-03-02 DIAGNOSIS — N186 End stage renal disease: Secondary | ICD-10-CM | POA: Diagnosis not present

## 2019-03-02 DIAGNOSIS — Z992 Dependence on renal dialysis: Secondary | ICD-10-CM | POA: Diagnosis not present

## 2019-03-02 DIAGNOSIS — N2581 Secondary hyperparathyroidism of renal origin: Secondary | ICD-10-CM | POA: Diagnosis not present

## 2019-03-02 DIAGNOSIS — D509 Iron deficiency anemia, unspecified: Secondary | ICD-10-CM | POA: Diagnosis not present

## 2019-03-02 DIAGNOSIS — D631 Anemia in chronic kidney disease: Secondary | ICD-10-CM | POA: Diagnosis not present

## 2019-03-02 DIAGNOSIS — E8779 Other fluid overload: Secondary | ICD-10-CM | POA: Diagnosis not present

## 2019-03-03 DIAGNOSIS — N186 End stage renal disease: Secondary | ICD-10-CM | POA: Diagnosis not present

## 2019-03-03 DIAGNOSIS — D631 Anemia in chronic kidney disease: Secondary | ICD-10-CM | POA: Diagnosis not present

## 2019-03-03 DIAGNOSIS — N2581 Secondary hyperparathyroidism of renal origin: Secondary | ICD-10-CM | POA: Diagnosis not present

## 2019-03-03 DIAGNOSIS — Z992 Dependence on renal dialysis: Secondary | ICD-10-CM | POA: Diagnosis not present

## 2019-03-03 DIAGNOSIS — E8779 Other fluid overload: Secondary | ICD-10-CM | POA: Diagnosis not present

## 2019-03-03 DIAGNOSIS — D509 Iron deficiency anemia, unspecified: Secondary | ICD-10-CM | POA: Diagnosis not present

## 2019-03-05 DIAGNOSIS — D509 Iron deficiency anemia, unspecified: Secondary | ICD-10-CM | POA: Diagnosis not present

## 2019-03-05 DIAGNOSIS — D631 Anemia in chronic kidney disease: Secondary | ICD-10-CM | POA: Diagnosis not present

## 2019-03-05 DIAGNOSIS — N2581 Secondary hyperparathyroidism of renal origin: Secondary | ICD-10-CM | POA: Diagnosis not present

## 2019-03-05 DIAGNOSIS — E8779 Other fluid overload: Secondary | ICD-10-CM | POA: Diagnosis not present

## 2019-03-05 DIAGNOSIS — N186 End stage renal disease: Secondary | ICD-10-CM | POA: Diagnosis not present

## 2019-03-05 DIAGNOSIS — Z992 Dependence on renal dialysis: Secondary | ICD-10-CM | POA: Diagnosis not present

## 2019-03-07 DIAGNOSIS — N186 End stage renal disease: Secondary | ICD-10-CM | POA: Diagnosis not present

## 2019-03-07 DIAGNOSIS — D509 Iron deficiency anemia, unspecified: Secondary | ICD-10-CM | POA: Diagnosis not present

## 2019-03-07 DIAGNOSIS — D631 Anemia in chronic kidney disease: Secondary | ICD-10-CM | POA: Diagnosis not present

## 2019-03-07 DIAGNOSIS — E8779 Other fluid overload: Secondary | ICD-10-CM | POA: Diagnosis not present

## 2019-03-07 DIAGNOSIS — Z992 Dependence on renal dialysis: Secondary | ICD-10-CM | POA: Diagnosis not present

## 2019-03-07 DIAGNOSIS — N2581 Secondary hyperparathyroidism of renal origin: Secondary | ICD-10-CM | POA: Diagnosis not present

## 2019-03-09 DIAGNOSIS — Z992 Dependence on renal dialysis: Secondary | ICD-10-CM | POA: Diagnosis not present

## 2019-03-09 DIAGNOSIS — E8779 Other fluid overload: Secondary | ICD-10-CM | POA: Diagnosis not present

## 2019-03-09 DIAGNOSIS — D631 Anemia in chronic kidney disease: Secondary | ICD-10-CM | POA: Diagnosis not present

## 2019-03-09 DIAGNOSIS — N186 End stage renal disease: Secondary | ICD-10-CM | POA: Diagnosis not present

## 2019-03-09 DIAGNOSIS — D509 Iron deficiency anemia, unspecified: Secondary | ICD-10-CM | POA: Diagnosis not present

## 2019-03-09 DIAGNOSIS — N2581 Secondary hyperparathyroidism of renal origin: Secondary | ICD-10-CM | POA: Diagnosis not present

## 2019-03-10 DIAGNOSIS — Z992 Dependence on renal dialysis: Secondary | ICD-10-CM | POA: Diagnosis not present

## 2019-03-10 DIAGNOSIS — E8779 Other fluid overload: Secondary | ICD-10-CM | POA: Diagnosis not present

## 2019-03-10 DIAGNOSIS — N186 End stage renal disease: Secondary | ICD-10-CM | POA: Diagnosis not present

## 2019-03-12 DIAGNOSIS — Z992 Dependence on renal dialysis: Secondary | ICD-10-CM | POA: Diagnosis not present

## 2019-03-12 DIAGNOSIS — D509 Iron deficiency anemia, unspecified: Secondary | ICD-10-CM | POA: Diagnosis not present

## 2019-03-12 DIAGNOSIS — N2581 Secondary hyperparathyroidism of renal origin: Secondary | ICD-10-CM | POA: Diagnosis not present

## 2019-03-12 DIAGNOSIS — E8779 Other fluid overload: Secondary | ICD-10-CM | POA: Diagnosis not present

## 2019-03-12 DIAGNOSIS — D631 Anemia in chronic kidney disease: Secondary | ICD-10-CM | POA: Diagnosis not present

## 2019-03-12 DIAGNOSIS — N186 End stage renal disease: Secondary | ICD-10-CM | POA: Diagnosis not present

## 2019-03-13 DIAGNOSIS — Z992 Dependence on renal dialysis: Secondary | ICD-10-CM | POA: Diagnosis not present

## 2019-03-13 DIAGNOSIS — N186 End stage renal disease: Secondary | ICD-10-CM | POA: Diagnosis not present

## 2019-03-14 ENCOUNTER — Other Ambulatory Visit (INDEPENDENT_AMBULATORY_CARE_PROVIDER_SITE_OTHER): Payer: Self-pay | Admitting: Vascular Surgery

## 2019-03-14 DIAGNOSIS — Z9862 Peripheral vascular angioplasty status: Secondary | ICD-10-CM

## 2019-03-14 DIAGNOSIS — N186 End stage renal disease: Secondary | ICD-10-CM | POA: Diagnosis not present

## 2019-03-14 DIAGNOSIS — N2581 Secondary hyperparathyroidism of renal origin: Secondary | ICD-10-CM | POA: Diagnosis not present

## 2019-03-14 DIAGNOSIS — D509 Iron deficiency anemia, unspecified: Secondary | ICD-10-CM | POA: Diagnosis not present

## 2019-03-14 DIAGNOSIS — R110 Nausea: Secondary | ICD-10-CM | POA: Diagnosis not present

## 2019-03-14 DIAGNOSIS — D631 Anemia in chronic kidney disease: Secondary | ICD-10-CM | POA: Diagnosis not present

## 2019-03-14 DIAGNOSIS — R1110 Vomiting, unspecified: Secondary | ICD-10-CM | POA: Diagnosis not present

## 2019-03-14 DIAGNOSIS — Z992 Dependence on renal dialysis: Secondary | ICD-10-CM | POA: Diagnosis not present

## 2019-03-15 ENCOUNTER — Encounter (INDEPENDENT_AMBULATORY_CARE_PROVIDER_SITE_OTHER): Payer: Medicare Other

## 2019-03-15 ENCOUNTER — Ambulatory Visit (INDEPENDENT_AMBULATORY_CARE_PROVIDER_SITE_OTHER): Payer: Medicare Other | Admitting: Nurse Practitioner

## 2019-03-16 DIAGNOSIS — Z992 Dependence on renal dialysis: Secondary | ICD-10-CM | POA: Diagnosis not present

## 2019-03-16 DIAGNOSIS — R1110 Vomiting, unspecified: Secondary | ICD-10-CM | POA: Diagnosis not present

## 2019-03-16 DIAGNOSIS — N2581 Secondary hyperparathyroidism of renal origin: Secondary | ICD-10-CM | POA: Diagnosis not present

## 2019-03-16 DIAGNOSIS — D509 Iron deficiency anemia, unspecified: Secondary | ICD-10-CM | POA: Diagnosis not present

## 2019-03-16 DIAGNOSIS — N186 End stage renal disease: Secondary | ICD-10-CM | POA: Diagnosis not present

## 2019-03-16 DIAGNOSIS — R110 Nausea: Secondary | ICD-10-CM | POA: Diagnosis not present

## 2019-03-17 DIAGNOSIS — E8779 Other fluid overload: Secondary | ICD-10-CM | POA: Diagnosis not present

## 2019-03-17 DIAGNOSIS — N186 End stage renal disease: Secondary | ICD-10-CM | POA: Diagnosis not present

## 2019-03-17 DIAGNOSIS — Z992 Dependence on renal dialysis: Secondary | ICD-10-CM | POA: Diagnosis not present

## 2019-03-19 DIAGNOSIS — R1110 Vomiting, unspecified: Secondary | ICD-10-CM | POA: Diagnosis not present

## 2019-03-19 DIAGNOSIS — R110 Nausea: Secondary | ICD-10-CM | POA: Diagnosis not present

## 2019-03-19 DIAGNOSIS — Z992 Dependence on renal dialysis: Secondary | ICD-10-CM | POA: Diagnosis not present

## 2019-03-19 DIAGNOSIS — N186 End stage renal disease: Secondary | ICD-10-CM | POA: Diagnosis not present

## 2019-03-19 DIAGNOSIS — N2581 Secondary hyperparathyroidism of renal origin: Secondary | ICD-10-CM | POA: Diagnosis not present

## 2019-03-19 DIAGNOSIS — D509 Iron deficiency anemia, unspecified: Secondary | ICD-10-CM | POA: Diagnosis not present

## 2019-03-21 ENCOUNTER — Encounter (INDEPENDENT_AMBULATORY_CARE_PROVIDER_SITE_OTHER): Payer: Medicare Other

## 2019-03-21 ENCOUNTER — Ambulatory Visit (INDEPENDENT_AMBULATORY_CARE_PROVIDER_SITE_OTHER): Payer: Medicare Other | Admitting: Nurse Practitioner

## 2019-03-21 DIAGNOSIS — R1110 Vomiting, unspecified: Secondary | ICD-10-CM | POA: Diagnosis not present

## 2019-03-21 DIAGNOSIS — D509 Iron deficiency anemia, unspecified: Secondary | ICD-10-CM | POA: Diagnosis not present

## 2019-03-21 DIAGNOSIS — N186 End stage renal disease: Secondary | ICD-10-CM | POA: Diagnosis not present

## 2019-03-21 DIAGNOSIS — Z992 Dependence on renal dialysis: Secondary | ICD-10-CM | POA: Diagnosis not present

## 2019-03-21 DIAGNOSIS — R110 Nausea: Secondary | ICD-10-CM | POA: Diagnosis not present

## 2019-03-21 DIAGNOSIS — N2581 Secondary hyperparathyroidism of renal origin: Secondary | ICD-10-CM | POA: Diagnosis not present

## 2019-03-23 DIAGNOSIS — N186 End stage renal disease: Secondary | ICD-10-CM | POA: Diagnosis not present

## 2019-03-23 DIAGNOSIS — N2581 Secondary hyperparathyroidism of renal origin: Secondary | ICD-10-CM | POA: Diagnosis not present

## 2019-03-23 DIAGNOSIS — D509 Iron deficiency anemia, unspecified: Secondary | ICD-10-CM | POA: Diagnosis not present

## 2019-03-23 DIAGNOSIS — R110 Nausea: Secondary | ICD-10-CM | POA: Diagnosis not present

## 2019-03-23 DIAGNOSIS — R1110 Vomiting, unspecified: Secondary | ICD-10-CM | POA: Diagnosis not present

## 2019-03-23 DIAGNOSIS — Z992 Dependence on renal dialysis: Secondary | ICD-10-CM | POA: Diagnosis not present

## 2019-03-24 DIAGNOSIS — Z992 Dependence on renal dialysis: Secondary | ICD-10-CM | POA: Diagnosis not present

## 2019-03-24 DIAGNOSIS — N186 End stage renal disease: Secondary | ICD-10-CM | POA: Diagnosis not present

## 2019-03-24 DIAGNOSIS — E8779 Other fluid overload: Secondary | ICD-10-CM | POA: Diagnosis not present

## 2019-03-26 DIAGNOSIS — N186 End stage renal disease: Secondary | ICD-10-CM | POA: Diagnosis not present

## 2019-03-26 DIAGNOSIS — D509 Iron deficiency anemia, unspecified: Secondary | ICD-10-CM | POA: Diagnosis not present

## 2019-03-26 DIAGNOSIS — Z992 Dependence on renal dialysis: Secondary | ICD-10-CM | POA: Diagnosis not present

## 2019-03-26 DIAGNOSIS — R1110 Vomiting, unspecified: Secondary | ICD-10-CM | POA: Diagnosis not present

## 2019-03-26 DIAGNOSIS — R110 Nausea: Secondary | ICD-10-CM | POA: Diagnosis not present

## 2019-03-26 DIAGNOSIS — N2581 Secondary hyperparathyroidism of renal origin: Secondary | ICD-10-CM | POA: Diagnosis not present

## 2019-03-28 DIAGNOSIS — N186 End stage renal disease: Secondary | ICD-10-CM | POA: Diagnosis not present

## 2019-03-28 DIAGNOSIS — D509 Iron deficiency anemia, unspecified: Secondary | ICD-10-CM | POA: Diagnosis not present

## 2019-03-28 DIAGNOSIS — R110 Nausea: Secondary | ICD-10-CM | POA: Diagnosis not present

## 2019-03-28 DIAGNOSIS — Z992 Dependence on renal dialysis: Secondary | ICD-10-CM | POA: Diagnosis not present

## 2019-03-28 DIAGNOSIS — R1110 Vomiting, unspecified: Secondary | ICD-10-CM | POA: Diagnosis not present

## 2019-03-28 DIAGNOSIS — N2581 Secondary hyperparathyroidism of renal origin: Secondary | ICD-10-CM | POA: Diagnosis not present

## 2019-03-30 DIAGNOSIS — R110 Nausea: Secondary | ICD-10-CM | POA: Diagnosis not present

## 2019-03-30 DIAGNOSIS — N2581 Secondary hyperparathyroidism of renal origin: Secondary | ICD-10-CM | POA: Diagnosis not present

## 2019-03-30 DIAGNOSIS — D509 Iron deficiency anemia, unspecified: Secondary | ICD-10-CM | POA: Diagnosis not present

## 2019-03-30 DIAGNOSIS — Z992 Dependence on renal dialysis: Secondary | ICD-10-CM | POA: Diagnosis not present

## 2019-03-30 DIAGNOSIS — R1110 Vomiting, unspecified: Secondary | ICD-10-CM | POA: Diagnosis not present

## 2019-03-30 DIAGNOSIS — N186 End stage renal disease: Secondary | ICD-10-CM | POA: Diagnosis not present

## 2019-03-31 DIAGNOSIS — E8779 Other fluid overload: Secondary | ICD-10-CM | POA: Diagnosis not present

## 2019-03-31 DIAGNOSIS — Z992 Dependence on renal dialysis: Secondary | ICD-10-CM | POA: Diagnosis not present

## 2019-03-31 DIAGNOSIS — N186 End stage renal disease: Secondary | ICD-10-CM | POA: Diagnosis not present

## 2019-04-01 DIAGNOSIS — R110 Nausea: Secondary | ICD-10-CM | POA: Diagnosis not present

## 2019-04-01 DIAGNOSIS — D509 Iron deficiency anemia, unspecified: Secondary | ICD-10-CM | POA: Diagnosis not present

## 2019-04-01 DIAGNOSIS — Z992 Dependence on renal dialysis: Secondary | ICD-10-CM | POA: Diagnosis not present

## 2019-04-01 DIAGNOSIS — N2581 Secondary hyperparathyroidism of renal origin: Secondary | ICD-10-CM | POA: Diagnosis not present

## 2019-04-01 DIAGNOSIS — R1110 Vomiting, unspecified: Secondary | ICD-10-CM | POA: Diagnosis not present

## 2019-04-01 DIAGNOSIS — N186 End stage renal disease: Secondary | ICD-10-CM | POA: Diagnosis not present

## 2019-04-02 DIAGNOSIS — Z992 Dependence on renal dialysis: Secondary | ICD-10-CM | POA: Diagnosis not present

## 2019-04-02 DIAGNOSIS — R110 Nausea: Secondary | ICD-10-CM | POA: Diagnosis not present

## 2019-04-02 DIAGNOSIS — R1110 Vomiting, unspecified: Secondary | ICD-10-CM | POA: Diagnosis not present

## 2019-04-02 DIAGNOSIS — N186 End stage renal disease: Secondary | ICD-10-CM | POA: Diagnosis not present

## 2019-04-02 DIAGNOSIS — N2581 Secondary hyperparathyroidism of renal origin: Secondary | ICD-10-CM | POA: Diagnosis not present

## 2019-04-02 DIAGNOSIS — D509 Iron deficiency anemia, unspecified: Secondary | ICD-10-CM | POA: Diagnosis not present

## 2019-04-04 DIAGNOSIS — D509 Iron deficiency anemia, unspecified: Secondary | ICD-10-CM | POA: Diagnosis not present

## 2019-04-04 DIAGNOSIS — R1110 Vomiting, unspecified: Secondary | ICD-10-CM | POA: Diagnosis not present

## 2019-04-04 DIAGNOSIS — N2581 Secondary hyperparathyroidism of renal origin: Secondary | ICD-10-CM | POA: Diagnosis not present

## 2019-04-04 DIAGNOSIS — N186 End stage renal disease: Secondary | ICD-10-CM | POA: Diagnosis not present

## 2019-04-04 DIAGNOSIS — Z992 Dependence on renal dialysis: Secondary | ICD-10-CM | POA: Diagnosis not present

## 2019-04-04 DIAGNOSIS — R110 Nausea: Secondary | ICD-10-CM | POA: Diagnosis not present

## 2019-04-06 DIAGNOSIS — Z992 Dependence on renal dialysis: Secondary | ICD-10-CM | POA: Diagnosis not present

## 2019-04-06 DIAGNOSIS — N2581 Secondary hyperparathyroidism of renal origin: Secondary | ICD-10-CM | POA: Diagnosis not present

## 2019-04-06 DIAGNOSIS — N186 End stage renal disease: Secondary | ICD-10-CM | POA: Diagnosis not present

## 2019-04-06 DIAGNOSIS — D509 Iron deficiency anemia, unspecified: Secondary | ICD-10-CM | POA: Diagnosis not present

## 2019-04-06 DIAGNOSIS — R1110 Vomiting, unspecified: Secondary | ICD-10-CM | POA: Diagnosis not present

## 2019-04-06 DIAGNOSIS — R110 Nausea: Secondary | ICD-10-CM | POA: Diagnosis not present

## 2019-04-07 DIAGNOSIS — Z992 Dependence on renal dialysis: Secondary | ICD-10-CM | POA: Diagnosis not present

## 2019-04-07 DIAGNOSIS — E8779 Other fluid overload: Secondary | ICD-10-CM | POA: Diagnosis not present

## 2019-04-07 DIAGNOSIS — N186 End stage renal disease: Secondary | ICD-10-CM | POA: Diagnosis not present

## 2019-04-09 DIAGNOSIS — D509 Iron deficiency anemia, unspecified: Secondary | ICD-10-CM | POA: Diagnosis not present

## 2019-04-09 DIAGNOSIS — R110 Nausea: Secondary | ICD-10-CM | POA: Diagnosis not present

## 2019-04-09 DIAGNOSIS — R1110 Vomiting, unspecified: Secondary | ICD-10-CM | POA: Diagnosis not present

## 2019-04-09 DIAGNOSIS — Z992 Dependence on renal dialysis: Secondary | ICD-10-CM | POA: Diagnosis not present

## 2019-04-09 DIAGNOSIS — N186 End stage renal disease: Secondary | ICD-10-CM | POA: Diagnosis not present

## 2019-04-09 DIAGNOSIS — N2581 Secondary hyperparathyroidism of renal origin: Secondary | ICD-10-CM | POA: Diagnosis not present

## 2019-04-11 DIAGNOSIS — N186 End stage renal disease: Secondary | ICD-10-CM | POA: Diagnosis not present

## 2019-04-11 DIAGNOSIS — N2581 Secondary hyperparathyroidism of renal origin: Secondary | ICD-10-CM | POA: Diagnosis not present

## 2019-04-11 DIAGNOSIS — R110 Nausea: Secondary | ICD-10-CM | POA: Diagnosis not present

## 2019-04-11 DIAGNOSIS — Z992 Dependence on renal dialysis: Secondary | ICD-10-CM | POA: Diagnosis not present

## 2019-04-11 DIAGNOSIS — D509 Iron deficiency anemia, unspecified: Secondary | ICD-10-CM | POA: Diagnosis not present

## 2019-04-11 DIAGNOSIS — R1110 Vomiting, unspecified: Secondary | ICD-10-CM | POA: Diagnosis not present

## 2019-04-12 ENCOUNTER — Ambulatory Visit (INDEPENDENT_AMBULATORY_CARE_PROVIDER_SITE_OTHER): Payer: Medicare Other | Admitting: Nurse Practitioner

## 2019-04-12 ENCOUNTER — Other Ambulatory Visit: Payer: Self-pay

## 2019-04-12 ENCOUNTER — Encounter (INDEPENDENT_AMBULATORY_CARE_PROVIDER_SITE_OTHER): Payer: Self-pay | Admitting: Nurse Practitioner

## 2019-04-12 ENCOUNTER — Encounter (INDEPENDENT_AMBULATORY_CARE_PROVIDER_SITE_OTHER): Payer: Self-pay

## 2019-04-12 ENCOUNTER — Ambulatory Visit (INDEPENDENT_AMBULATORY_CARE_PROVIDER_SITE_OTHER): Payer: Medicare Other

## 2019-04-12 VITALS — BP 177/87 | HR 54 | Resp 16 | Ht 67.0 in | Wt 162.6 lb

## 2019-04-12 DIAGNOSIS — J431 Panlobular emphysema: Secondary | ICD-10-CM | POA: Diagnosis not present

## 2019-04-12 DIAGNOSIS — Z9862 Peripheral vascular angioplasty status: Secondary | ICD-10-CM

## 2019-04-12 DIAGNOSIS — I10 Essential (primary) hypertension: Secondary | ICD-10-CM | POA: Diagnosis not present

## 2019-04-12 DIAGNOSIS — E785 Hyperlipidemia, unspecified: Secondary | ICD-10-CM | POA: Diagnosis not present

## 2019-04-12 DIAGNOSIS — N186 End stage renal disease: Secondary | ICD-10-CM | POA: Diagnosis not present

## 2019-04-12 DIAGNOSIS — Z720 Tobacco use: Secondary | ICD-10-CM

## 2019-04-12 DIAGNOSIS — T829XXD Unspecified complication of cardiac and vascular prosthetic device, implant and graft, subsequent encounter: Secondary | ICD-10-CM

## 2019-04-12 NOTE — Progress Notes (Signed)
SUBJECTIVE:  Patient ID: Daryl Weeks, male    DOB: 02/23/1961, 58 y.o.   MRN: [id number] Chief Complaint  Patient presents with   Follow-up    [facility] 6week HDA    HPI  Daryl Weeks is a 58 y.o. male The patient returns to the office for followup status post intervention of the dialysis access left radial basilic AV fistula with a jump graft. Following the intervention the access function has significantly improved, with better flow rates and improved KT/V. The patient has not been experiencing increased bleeding times following decannulation and the patient denies increased recirculation. The patient denies an increase in arm swelling. At the present time the patient denies hand pain.  The patient denies amaurosis fugax or recent TIA symptoms. There are no recent neurological changes noted. The patient denies claudication symptoms or rest pain symptoms. The patient denies history of DVT, PE or superficial thrombophlebitis. The patient denies recent episodes of angina or shortness of breath.   Duplex ultrasound of the AV access shows a patent access with uniform velocities.  No focal hemodynamically significant stenosis.  The patient has a flow volume of 923.  When compared to previous exam on 01/25/2019 the maximum anastomosis velocity has decreased and flow volume is increased.      Past Medical History:  Diagnosis Date   Anemia    Anxiety    Arthritis    CHF (congestive heart failure) ([place])    Chronic kidney disease on chronic dialysis ([place])    dialysis on m, w, f   Colon polyps    COPD (chronic obstructive pulmonary disease) ([place])    Coronary artery disease    Non-ST elevation myocardial infarction in February of 2015. In the setting of hypertensive urgency and blood loss anemia. Cardiac catheterization showed significant three-vessel coronary artery disease. EF was 55% by echo. He underwent CABG at [facility] on May 19 with LIMA to LAD, Sequential SVG to OM1 and OM2, and  SVG to RCA   End stage renal disease ([place])    m-w-f   -davida Donnellson   End stage renal disease ([place])    ETOH abuse    GERD (gastroesophageal reflux disease)    hx   Hyperlipidemia    Hypertension    IgA nephropathy    Lower GI bleed    Due to colon polyps. Status post resection of 14 polyps   Non-ST elevation MI (NSTEMI) ([facility])    Pneumonia    hx   Rheumatoid arthritis ([facility])    Shortness of breath    Sleep apnea    Tobacco abuse    Tobacco abuse     Past Surgical History:  Procedure Laterality Date   A/V FISTULAGRAM Left 10/28/2016   Procedure: A/V Fistulagram;  Surgeon: [name], MD;  Location: [place] [facility];  Service: Cardiovascular;  Laterality: Left;   A/V FISTULAGRAM Left 12/07/2018   Procedure: A/V FISTULAGRAM;  Surgeon: [name], MD;  Location: [place] [facility];  Service: Cardiovascular;  Laterality: Left;   A/V SHUNT INTERVENTION N/A 10/28/2016   Procedure: A/V Shunt Intervention;  Surgeon: [name], MD;  Location: [place] [facility];  Service: Cardiovascular;  Laterality: N/A;   A/V SHUNTOGRAM Left 02/01/2019   Procedure: A/V SHUNTOGRAM;  Surgeon: [name], MD;  Location: [place] [facility];  Service: Cardiovascular;  Laterality: Left;   AV FISTULA PLACEMENT     CARDIAC CATHETERIZATION     RCA 90% and calcified  mid LAD 80% Stenosis   CORONARY ARTERY BYPASS GRAFT N/A 11/29/2013   Procedure: CORONARY ARTERY BYPASS GRAFTING (CABG) x 4 using endoscopically harvested right saphenous vein and left internal mammary artery;  Surgeon: [name], MD;  Location: [place] [facility];  Service: Open Heart Surgery;  Laterality: N/A;   dialysis catheterr  2/15   DIALYSIS/PERMA CATHETER REMOVAL N/A 02/15/2019   Procedure: DIALYSIS/PERMA CATHETER REMOVAL;  Surgeon: [name], MD;  Location: [place] [facility];  Service: Cardiovascular;  Laterality: N/A;   INSERTION OF DIALYSIS CATHETER N/A 12/14/2018   Procedure: INSERTION OF DIALYSIS  CATHETER ( PERMCATH );  Surgeon: [name], MD;  Location: [facility];  Service: Vascular;  Laterality: N/A;   INTRAOPERATIVE TRANSESOPHAGEAL ECHOCARDIOGRAM N/A 11/29/2013   Procedure: INTRAOPERATIVE TRANSESOPHAGEAL ECHOCARDIOGRAM;  Surgeon: [name], MD;  Location: [place] [facility];  Service: Open Heart Surgery;  Laterality: N/A;   PERIPHERAL VASCULAR CATHETERIZATION N/A 06/12/2015   Procedure: A/V Shuntogram/Fistulagram;  Surgeon: [name], MD;  Location: [place] [facility];  Service: Cardiovascular;  Laterality: N/A;   PERIPHERAL VASCULAR CATHETERIZATION Left 06/12/2015   Procedure: A/V Shunt Intervention;  Surgeon: [name], MD;  Location: [place] [facility];  Service: Cardiovascular;  Laterality: Left;   RENAL BIOPSY Left 14   REVISON OF ARTERIOVENOUS FISTULA Left 12/14/2018   Procedure: REVISON OF ARTERIOVENOUS FISTULA;  Surgeon: [name], MD;  Location: [facility];  Service: Vascular;  Laterality: Left;    Social History   Socioeconomic History   Marital status: Single    Spouse name: Not on file   Number of children: Not on file   Years of education: Not on file   Highest education level: 8th grade  Occupational History   Not on file  Social Needs   Financial resource strain: Not hard at all   Food insecurity    Worry: Never true    Inability: Never true   Transportation needs    Medical: No    Non-medical: No  Tobacco Use   Smoking status: Current Every Day Smoker    Packs/day: 0.50    Years: 32.00    Pack years: 16.00    Types: Cigarettes   Smokeless tobacco: Never Used   Tobacco comment: daily  Substance and Sexual Activity   Alcohol use: Yes    Alcohol/week: 6.0 standard drinks    Types: 6 Cans of beer per week    Comment: weekly   Drug use: No   Sexual activity: Not on file  Lifestyle   Physical activity    Days per week: 0 days    Minutes per session: 0 min   Stress: Not at all  Relationships   Social connections    Talks on  phone: More than three times a week    Gets together: More than three times a week    Attends religious service: Never    Active member of club or organization: No    Attends meetings of clubs or organizations: Never    Relationship status: Divorced   Intimate partner violence    Fear of current or ex partner: No    Emotionally abused: No    Physically abused: No    Forced sexual activity: No  Other Topics Concern   Not on file  Social History Narrative   Not on file    Family History  Problem Relation Age of Onset   Heart disease Father    Heart disease Brother  Healthy Sister    Stroke Neg Hx     Allergies  Allergen Reactions   Lisinopril      Review of Systems   Review of Systems: Negative Unless Checked Constitutional: [] Weight loss  [] Fever  [] Chills Cardiac: [] Chest pain   []  Atrial Fibrillation  [] Palpitations   [] Shortness of breath when laying flat   [x] Shortness of breath with exertion. [] Shortness of breath at rest Vascular:  [] Pain in legs with walking   [] Pain in legs with standing [] Pain in legs when laying flat   [] Claudication    [] Pain in feet when laying flat    [] History of DVT   [] Phlebitis   [] Swelling in legs   [] Varicose veins   [] Non-healing ulcers Pulmonary:   [] Uses home oxygen   [] Productive cough   [] Hemoptysis   [] Wheeze  [x] COPD   [] Asthma Neurologic:  [] Dizziness   [] Seizures  [] Blackouts [] History of stroke   [] History of TIA  [] Aphasia   [] Temporary Blindness   [] Weakness or numbness in arm   [] Weakness or numbness in leg Musculoskeletal:   [] Joint swelling   [] Joint pain   [] Low back pain  []  History of Knee Replacement [] Arthritis [] back Surgeries  []  Spinal Stenosis    Hematologic:  [] Easy bruising  [] Easy bleeding   [] Hypercoagulable state   [x] Anemic Gastrointestinal:  [] Diarrhea   [] Vomiting  [x] Gastroesophageal reflux/heartburn   [] Difficulty swallowing. [] Abdominal pain Genitourinary:  [x] Chronic kidney disease   [] Difficult  urination  [] Anuric   [] Blood in urine [] Frequent urination  [] Burning with urination   [] Hematuria Skin:  [] Rashes   [] Ulcers [] Wounds Psychological:  [x] History of anxiety   []  History of major depression  []  Memory Difficulties      OBJECTIVE:   Physical Exam  BP (!) 177/87 (BP Location: Right Arm)    Pulse (!) 54    Resp 16    Ht 5\' 7"  (1.702 m)    Wt 162 lb 9.6 oz (73.8 kg)    BMI 25.47 kg/m   Gen: WD/WN, NAD Head: [place]/AT, No temporalis wasting.  Ear/Nose/Throat: Hearing grossly intact, nares w/o erythema or drainage Eyes: PER, EOMI, sclera nonicteric.  Neck: Supple, no masses.  No JVD.  Pulmonary:  Good air movement, no use of accessory muscles.  Cardiac: RRR Vascular:  Good thrill and bruit Vessel Right Left  Radial Palpable Palpable   Gastrointestinal: soft, non-distended. No guarding/no peritoneal signs.  Musculoskeletal: M/S 5/5 throughout.  No deformity or atrophy.  Neurologic: Pain and light touch intact in extremities.  Symmetrical.  Speech is fluent. Motor exam as listed above. Psychiatric: Judgment intact, Mood & affect appropriate for pt's clinical situation. Dermatologic: No Venous rashes. No Ulcers Noted.  No changes consistent with cellulitis. Lymph : No Cervical lymphadenopathy, no lichenification or skin changes of chronic lymphedema.       ASSESSMENT AND PLAN:  1. Complication of vascular access for dialysis, subsequent encounter Duplex ultrasound of the AV access shows a patent access with uniform velocities.  No focal hemodynamically significant stenosis.  The patient has a flow volume of 923.  When compared to previous exam on 01/25/2019 the maximum anastomosis velocity has decreased and flow volume is increased.  Recommend:  The patient is doing well and currently has adequate dialysis access. The patient's dialysis center is not reporting any access issues. Flow pattern is stable when compared to the prior ultrasound.  The patient should have a  duplex ultrasound of the dialysis access in 6 months. The patient will follow-up  with me in the office after each ultrasound     - VAS [place] (AVF, AVG); Future  2. Panlobular emphysema ([place]) Continue pulmonary medications and aerosols as already ordered, these medications have been reviewed and there are no changes at this time.   3. Hyperlipidemia, unspecified hyperlipidemia type Continue statin as ordered and reviewed, no changes at this time   4. Tobacco abuse Smoking cessation was discussed, 3-10 minutes spent on this topic specifically   5. Essential hypertension Continue antihypertensive medications as already ordered, these medications have been reviewed and there are no changes at this time.    Current Outpatient Medications on File Prior to Visit  Medication Sig Dispense Refill   albuterol (VENTOLIN HFA) 108 (90 Base) MCG/ACT inhaler Inhale 2 puffs into the lungs every 6 (six) hours as needed for wheezing or shortness of breath.     amLODipine (NORVASC) 10 MG tablet take 1 tablet by mouth once daily (Patient taking differently: Take 10 mg by mouth at bedtime. take 1 tablet by mouth once daily) 30 tablet 5   aspirin EC 81 MG tablet Take 1 tablet (81 mg total) by mouth daily. 90 tablet 3   atorvastatin (LIPITOR) 20 MG tablet take 1 tablet by mouth at bedtime 30 tablet 6   AURYXIA 1 GM 210 MG(Fe) tablet TK 1 T PO  TID BEFORE EACH MEAL  11   budesonide (PULMICORT FLEXHALER) 180 MCG/ACT inhaler Inhale 1 puff into the lungs 2 (two) times daily. 1 each 11   budesonide (PULMICORT) 0.5 MG/2ML nebulizer solution Take 2 mLs (0.5 mg total) by nebulization every 12 (twelve) hours. 60 mL 11   calcium carbonate (TUMS - DOSED IN MG ELEMENTAL CALCIUM) 500 MG chewable tablet Chew 1 tablet by mouth as needed.      cinacalcet (SENSIPAR) 30 MG tablet Take 30 mg by mouth daily with supper.      cloNIDine (CATAPRES) 0.1 MG tablet TK 1 T PO BID FOR HIGH BP  11    doxycycline (VIBRA-TABS) 100 MG tablet Take 100 mg by mouth 2 (two) times daily.     furosemide (LASIX) 80 MG tablet Take 80 mg by mouth 2 (two) times daily.      gabapentin (NEURONTIN) 100 MG capsule TK 1 C PO TID  11   irbesartan (AVAPRO) 150 MG tablet TK 1 T PO D AT NIGHT FOR HIGH BP  11   irbesartan (AVAPRO) 300 MG tablet Take 300 mg by mouth at bedtime.   0   lidocaine-prilocaine (EMLA) cream Apply 1 application topically as needed.     metoprolol tartrate (LOPRESSOR) 100 MG tablet Take 1 tablet (100 mg total) by mouth 2 (two) times daily. 180 tablet 3   multivitamin (RENA-VIT) TABS tablet Take 1 tablet by mouth daily.   11   omeprazole (PRILOSEC) 20 MG capsule Take 20 mg by mouth every morning.   5   ondansetron (ZOFRAN) 4 MG tablet TK 1 T PO BID PRF NAUSEA  1   sevelamer carbonate (RENVELA) 800 MG tablet Take 800 mg by mouth 3 (three) times daily with meals.     tiotropium (SPIRIVA) 18 MCG inhalation capsule Place 1 capsule (18 mcg total) into inhaler and inhale daily. (Patient taking differently: Place 18 mcg into inhaler and inhale every evening. ) 30 capsule 12   zolpidem (AMBIEN) 10 MG tablet Take 10 mg by mouth at bedtime.      HYDROcodone-acetaminophen (NORCO) 5-325 MG tablet Take 1 tablet by  mouth every 6 (six) hours as needed for moderate pain. (Patient not taking: Reported on 02/01/2019) 30 tablet 0   Current Facility-Administered Medications on File Prior to Visit  Medication Dose Route Frequency Provider Last Rate Last Dose   ceFAZolin (ANCEF) IVPB 1 g/50 mL premix  1 g Intravenous Once Dew, Erskine Squibb, MD        There are no Patient Instructions on file for this visit. Return in about 6 months (around 10/13/2019) for HDA.   Kris Hartmann, NP  This note was completed with Sales executive.  Any errors are purely unintentional.

## 2019-04-13 DIAGNOSIS — N2581 Secondary hyperparathyroidism of renal origin: Secondary | ICD-10-CM | POA: Diagnosis not present

## 2019-04-13 DIAGNOSIS — Z992 Dependence on renal dialysis: Secondary | ICD-10-CM | POA: Diagnosis not present

## 2019-04-13 DIAGNOSIS — N186 End stage renal disease: Secondary | ICD-10-CM | POA: Diagnosis not present

## 2019-04-13 DIAGNOSIS — D509 Iron deficiency anemia, unspecified: Secondary | ICD-10-CM | POA: Diagnosis not present

## 2019-04-13 DIAGNOSIS — R1110 Vomiting, unspecified: Secondary | ICD-10-CM | POA: Diagnosis not present

## 2019-04-13 DIAGNOSIS — R110 Nausea: Secondary | ICD-10-CM | POA: Diagnosis not present

## 2019-04-14 DIAGNOSIS — D509 Iron deficiency anemia, unspecified: Secondary | ICD-10-CM | POA: Diagnosis not present

## 2019-04-14 DIAGNOSIS — Z992 Dependence on renal dialysis: Secondary | ICD-10-CM | POA: Diagnosis not present

## 2019-04-14 DIAGNOSIS — N2581 Secondary hyperparathyroidism of renal origin: Secondary | ICD-10-CM | POA: Diagnosis not present

## 2019-04-14 DIAGNOSIS — D631 Anemia in chronic kidney disease: Secondary | ICD-10-CM | POA: Diagnosis not present

## 2019-04-14 DIAGNOSIS — N186 End stage renal disease: Secondary | ICD-10-CM | POA: Diagnosis not present

## 2019-04-16 DIAGNOSIS — N2581 Secondary hyperparathyroidism of renal origin: Secondary | ICD-10-CM | POA: Diagnosis not present

## 2019-04-16 DIAGNOSIS — D631 Anemia in chronic kidney disease: Secondary | ICD-10-CM | POA: Diagnosis not present

## 2019-04-16 DIAGNOSIS — Z992 Dependence on renal dialysis: Secondary | ICD-10-CM | POA: Diagnosis not present

## 2019-04-16 DIAGNOSIS — N186 End stage renal disease: Secondary | ICD-10-CM | POA: Diagnosis not present

## 2019-04-16 DIAGNOSIS — D509 Iron deficiency anemia, unspecified: Secondary | ICD-10-CM | POA: Diagnosis not present

## 2019-04-18 DIAGNOSIS — N186 End stage renal disease: Secondary | ICD-10-CM | POA: Diagnosis not present

## 2019-04-18 DIAGNOSIS — D631 Anemia in chronic kidney disease: Secondary | ICD-10-CM | POA: Diagnosis not present

## 2019-04-18 DIAGNOSIS — D509 Iron deficiency anemia, unspecified: Secondary | ICD-10-CM | POA: Diagnosis not present

## 2019-04-18 DIAGNOSIS — Z992 Dependence on renal dialysis: Secondary | ICD-10-CM | POA: Diagnosis not present

## 2019-04-18 DIAGNOSIS — N2581 Secondary hyperparathyroidism of renal origin: Secondary | ICD-10-CM | POA: Diagnosis not present

## 2019-04-20 DIAGNOSIS — Z992 Dependence on renal dialysis: Secondary | ICD-10-CM | POA: Diagnosis not present

## 2019-04-20 DIAGNOSIS — D509 Iron deficiency anemia, unspecified: Secondary | ICD-10-CM | POA: Diagnosis not present

## 2019-04-20 DIAGNOSIS — N2581 Secondary hyperparathyroidism of renal origin: Secondary | ICD-10-CM | POA: Diagnosis not present

## 2019-04-20 DIAGNOSIS — D631 Anemia in chronic kidney disease: Secondary | ICD-10-CM | POA: Diagnosis not present

## 2019-04-20 DIAGNOSIS — N186 End stage renal disease: Secondary | ICD-10-CM | POA: Diagnosis not present

## 2019-04-23 DIAGNOSIS — N2581 Secondary hyperparathyroidism of renal origin: Secondary | ICD-10-CM | POA: Diagnosis not present

## 2019-04-23 DIAGNOSIS — N186 End stage renal disease: Secondary | ICD-10-CM | POA: Diagnosis not present

## 2019-04-23 DIAGNOSIS — Z992 Dependence on renal dialysis: Secondary | ICD-10-CM | POA: Diagnosis not present

## 2019-04-23 DIAGNOSIS — D509 Iron deficiency anemia, unspecified: Secondary | ICD-10-CM | POA: Diagnosis not present

## 2019-04-23 DIAGNOSIS — D631 Anemia in chronic kidney disease: Secondary | ICD-10-CM | POA: Diagnosis not present

## 2019-04-25 DIAGNOSIS — Z992 Dependence on renal dialysis: Secondary | ICD-10-CM | POA: Diagnosis not present

## 2019-04-25 DIAGNOSIS — N2581 Secondary hyperparathyroidism of renal origin: Secondary | ICD-10-CM | POA: Diagnosis not present

## 2019-04-25 DIAGNOSIS — D631 Anemia in chronic kidney disease: Secondary | ICD-10-CM | POA: Diagnosis not present

## 2019-04-25 DIAGNOSIS — D509 Iron deficiency anemia, unspecified: Secondary | ICD-10-CM | POA: Diagnosis not present

## 2019-04-25 DIAGNOSIS — N186 End stage renal disease: Secondary | ICD-10-CM | POA: Diagnosis not present

## 2019-04-27 DIAGNOSIS — D509 Iron deficiency anemia, unspecified: Secondary | ICD-10-CM | POA: Diagnosis not present

## 2019-04-27 DIAGNOSIS — N2581 Secondary hyperparathyroidism of renal origin: Secondary | ICD-10-CM | POA: Diagnosis not present

## 2019-04-27 DIAGNOSIS — N186 End stage renal disease: Secondary | ICD-10-CM | POA: Diagnosis not present

## 2019-04-27 DIAGNOSIS — D631 Anemia in chronic kidney disease: Secondary | ICD-10-CM | POA: Diagnosis not present

## 2019-04-27 DIAGNOSIS — Z992 Dependence on renal dialysis: Secondary | ICD-10-CM | POA: Diagnosis not present

## 2019-04-28 ENCOUNTER — Other Ambulatory Visit: Payer: Self-pay

## 2019-04-28 ENCOUNTER — Emergency Department
Admission: EM | Admit: 2019-04-28 | Discharge: 2019-04-28 | Disposition: A | Discharge status: Home / Self Care | Payer: Medicare Other | Attending: Emergency Medicine | Admitting: Emergency Medicine

## 2019-04-28 DIAGNOSIS — F17210 Nicotine dependence, cigarettes, uncomplicated: Secondary | ICD-10-CM | POA: Diagnosis not present

## 2019-04-28 DIAGNOSIS — I25810 Atherosclerosis of coronary artery bypass graft(s) without angina pectoris: Secondary | ICD-10-CM | POA: Diagnosis not present

## 2019-04-28 DIAGNOSIS — N186 End stage renal disease: Secondary | ICD-10-CM | POA: Insufficient documentation

## 2019-04-28 DIAGNOSIS — M069 Rheumatoid arthritis, unspecified: Secondary | ICD-10-CM | POA: Diagnosis not present

## 2019-04-28 DIAGNOSIS — Z20828 Contact with and (suspected) exposure to other viral communicable diseases: Secondary | ICD-10-CM | POA: Insufficient documentation

## 2019-04-28 DIAGNOSIS — Z79899 Other long term (current) drug therapy: Secondary | ICD-10-CM | POA: Diagnosis not present

## 2019-04-28 DIAGNOSIS — I5032 Chronic diastolic (congestive) heart failure: Secondary | ICD-10-CM | POA: Insufficient documentation

## 2019-04-28 DIAGNOSIS — I252 Old myocardial infarction: Secondary | ICD-10-CM | POA: Diagnosis not present

## 2019-04-28 DIAGNOSIS — Z7982 Long term (current) use of aspirin: Secondary | ICD-10-CM | POA: Diagnosis not present

## 2019-04-28 DIAGNOSIS — J449 Chronic obstructive pulmonary disease, unspecified: Secondary | ICD-10-CM | POA: Insufficient documentation

## 2019-04-28 DIAGNOSIS — Z992 Dependence on renal dialysis: Secondary | ICD-10-CM | POA: Insufficient documentation

## 2019-04-28 DIAGNOSIS — Z03818 Encounter for observation for suspected exposure to other biological agents ruled out: Secondary | ICD-10-CM | POA: Diagnosis not present

## 2019-04-28 DIAGNOSIS — I132 Hypertensive heart and chronic kidney disease with heart failure and with stage 5 chronic kidney disease, or end stage renal disease: Secondary | ICD-10-CM | POA: Insufficient documentation

## 2019-04-28 DIAGNOSIS — Z20822 Contact with and (suspected) exposure to covid-19: Secondary | ICD-10-CM

## 2019-04-28 LAB — SARS CORONAVIRUS 2 BY RT PCR (HOSPITAL ORDER, PERFORMED IN ~~LOC~~ HOSPITAL LAB): SARS Coronavirus 2: NEGATIVE

## 2019-04-28 NOTE — ED Triage Notes (Signed)
Pt states dialysis called him this AM stating that the lady who sits beside him at dialysis tested positive for COVID yesterday. Sent him to ED to have a test. Denies ALL symptoms of covid. A&O, ambulatory. States after he leaves here he will contact his dialysis center to see when he can get dialysis scheduled.

## 2019-04-28 NOTE — ED Notes (Signed)
First Nurse Note: patient was next to someone in dialysis 2 days ago. Patient was called this am and told to come in and get checked. Patient is supposed to have dialysis this am, but was told by dialysis center to instead come to ED and be checked. Patient denying symptoms at this time.

## 2019-04-28 NOTE — ED Provider Notes (Signed)
[facility] Emergency Department Provider Note  ____________________________________________  Time seen: Approximately 8:47 AM  I have reviewed the triage vital signs and the nursing notes.   HISTORY  Chief Complaint Covid test   HPI Daryl Weeks is a 58 y.o. male presenting to the emergency department for COVID test.  He was scheduled to have dialysis today, but because he sat next to a person who tested positive during his last dialysis session, he had to come in today for test before they would allow him to come to dialysis.  He has not experienced any symptoms of concern.  Past Medical History:  Diagnosis Date  . Anemia   . Anxiety   . Arthritis   . CHF (congestive heart failure) ([place])   . Chronic kidney disease on chronic dialysis ([place])    dialysis on m, w, f  . Colon polyps   . COPD (chronic obstructive pulmonary disease) ([place])   . Coronary artery disease    Non-ST elevation myocardial infarction in February of 2015. In the setting of hypertensive urgency and blood loss anemia. Cardiac catheterization showed significant three-vessel coronary artery disease. EF was 55% by echo. He underwent CABG at [facility] on May 19 with LIMA to LAD, Sequential SVG to OM1 and OM2, and SVG to RCA  . End stage renal disease ([place])    m-w-f   -[facility] [place]  . End stage renal disease ([place])   . ETOH abuse   . GERD (gastroesophageal reflux disease)    hx  . Hyperlipidemia   . Hypertension   . IgA nephropathy   . Lower GI bleed    Due to colon polyps. Status post resection of 14 polyps  . Non-ST elevation MI (NSTEMI) ([place])   . Pneumonia    hx  . Rheumatoid arthritis ([place])   . Shortness of breath   . Sleep apnea   . Tobacco abuse   . Tobacco abuse     Patient Active Problem List   Diagnosis Date Noted  . Complication of vascular access for dialysis 11/28/2018  . COPD (chronic obstructive pulmonary disease) ([place]) 12/05/2017  . ESRD on dialysis ([place])  10/05/2016  . IgA nephropathy 11/05/2014  . S/P CABG x 4 11/29/2013  . Chronic diastolic heart failure ([place]) 11/16/2013  . Coronary artery disease   . Hyperlipidemia   . Essential hypertension   . Dyspnea 11/20/2012  . Tobacco abuse     Past Surgical History:  Procedure Laterality Date  . A/V FISTULAGRAM Left 10/28/2016   Procedure: A/V Fistulagram;  Surgeon: [name], MD;  Location: [place] [facility];  Service: Cardiovascular;  Laterality: Left;  . A/V FISTULAGRAM Left 12/07/2018   Procedure: A/V FISTULAGRAM;  Surgeon: [name], MD;  Location: [place] [facility];  Service: Cardiovascular;  Laterality: Left;  . A/V SHUNT INTERVENTION N/A 10/28/2016   Procedure: A/V Shunt Intervention;  Surgeon: [name], MD;  Location: [place] [facility];  Service: Cardiovascular;  Laterality: N/A;  . A/V SHUNTOGRAM Left 02/01/2019   Procedure: A/V SHUNTOGRAM;  Surgeon: [name], MD;  Location: [place] [facility];  Service: Cardiovascular;  Laterality: Left;  . AV FISTULA PLACEMENT    . CARDIAC CATHETERIZATION     RCA 90% and calcified mid LAD 80% Stenosis  . CORONARY ARTERY BYPASS GRAFT N/A 11/29/2013   Procedure: CORONARY ARTERY BYPASS GRAFTING (CABG) x 4 using endoscopically harvested right saphenous vein and left internal mammary artery;  Surgeon: Gaspar Bidding  Alveria Apley, MD;  Location: [place];  Service: Open Heart Surgery;  Laterality: N/A;  . dialysis catheterr  2/15  . DIALYSIS/PERMA CATHETER REMOVAL N/A 02/15/2019   Procedure: DIALYSIS/PERMA CATHETER REMOVAL;  Surgeon: [name], MD;  Location: [place] [facility];  Service: Cardiovascular;  Laterality: N/A;  . INSERTION OF DIALYSIS CATHETER N/A 12/14/2018   Procedure: INSERTION OF DIALYSIS CATHETER ( [place] );  Surgeon: [name], MD;  Location: [facility];  Service: Vascular;  Laterality: N/A;  . INTRAOPERATIVE TRANSESOPHAGEAL ECHOCARDIOGRAM N/A 11/29/2013   Procedure: INTRAOPERATIVE TRANSESOPHAGEAL ECHOCARDIOGRAM;  Surgeon: [name], MD;  Location: [place] [facility];  Service: Open Heart Surgery;  Laterality: N/A;  . PERIPHERAL VASCULAR CATHETERIZATION N/A 06/12/2015   Procedure: A/V Shuntogram/Fistulagram;  Surgeon: [name], MD;  Location: [place] [facility];  Service: Cardiovascular;  Laterality: N/A;  . PERIPHERAL VASCULAR CATHETERIZATION Left 06/12/2015   Procedure: A/V Shunt Intervention;  Surgeon: [name], MD;  Location: [place] [facility];  Service: Cardiovascular;  Laterality: Left;  . RENAL BIOPSY Left 14  . REVISON OF ARTERIOVENOUS FISTULA Left 12/14/2018   Procedure: REVISON OF ARTERIOVENOUS FISTULA;  Surgeon: [name], MD;  Location: [facility];  Service: Vascular;  Laterality: Left;    Prior to Admission medications   Medication Sig Start Date End Date Taking? Authorizing Provider  albuterol (VENTOLIN HFA) 108 (90 Base) MCG/ACT inhaler Inhale 2 puffs into the lungs every 6 (six) hours as needed for wheezing or shortness of breath.    [provider]  amLODipine (NORVASC) 10 MG tablet take 1 tablet by mouth once daily Patient taking differently: Take 10 mg by mouth at bedtime. take 1 tablet by mouth once daily 06/29/16   Wellington Hampshire, MD  aspirin EC 81 MG tablet Take 1 tablet (81 mg total) by mouth daily. 05/28/14   Wellington Hampshire, MD  atorvastatin (LIPITOR) 20 MG tablet take 1 tablet by mouth at bedtime 04/25/14   Minna Merritts, MD  AURYXIA 1 GM 210 MG(Fe) tablet TK 1 T PO  TID BEFORE [facility] MEAL 08/24/17   [provider]  budesonide (PULMICORT FLEXHALER) 180 MCG/ACT inhaler Inhale 1 puff into the lungs 2 (two) times daily. 02/21/18   Mikey College, NP  budesonide (PULMICORT) 0.5 MG/2ML nebulizer solution Take 2 mLs (0.5 mg total) by nebulization every 12 (twelve) hours. 06/03/17   Laverle Hobby, MD  calcium carbonate (TUMS - DOSED IN MG ELEMENTAL CALCIUM) 500 MG chewable tablet Chew 1 tablet by mouth as needed.     [provider]  cinacalcet (SENSIPAR)  30 MG tablet Take 30 mg by mouth daily with supper.     [provider]  cloNIDine (CATAPRES) 0.1 MG tablet TK 1 T PO BID FOR HIGH BP 08/19/17   [provider]  doxycycline (VIBRA-TABS) 100 MG tablet Take 100 mg by mouth 2 (two) times daily.    [provider]  furosemide (LASIX) 80 MG tablet Take 80 mg by mouth 2 (two) times daily.     [provider]  gabapentin (NEURONTIN) 100 MG capsule TK 1 C PO TID 01/14/18   [provider]  HYDROcodone-acetaminophen (NORCO) 5-325 MG tablet Take 1 tablet by mouth every 6 (six) hours as needed for moderate pain. Patient not taking: Reported on 02/01/2019 12/14/18   [name], MD  irbesartan (AVAPRO) 150 MG tablet TK 1 T PO D AT NIGHT FOR HIGH BP 09/09/17   [provider]  irbesartan (AVAPRO) 300 MG tablet Take 300 mg by mouth at bedtime.  08/19/17   [provider]  lidocaine-prilocaine (EMLA) cream Apply 1 application topically as needed.    [provider]  metoprolol tartrate (LOPRESSOR) 100 MG tablet Take 1 tablet (100 mg total) by mouth 2 (two) times daily. 02/21/18   Mikey College, NP  multivitamin (RENA-VIT) TABS tablet Take 1 tablet by mouth daily.  05/22/18   [provider]  omeprazole (PRILOSEC) 20 MG capsule Take 20 mg by mouth every morning.  06/14/18   [provider]  ondansetron (ZOFRAN) 4 MG tablet TK 1 T PO BID PRF NAUSEA 06/14/18   [provider]  sevelamer carbonate (RENVELA) 800 MG tablet Take 800 mg by mouth 3 (three) times daily with meals.    [provider]  tiotropium (SPIRIVA) 18 MCG inhalation capsule Place 1 capsule (18 mcg total) into inhaler and inhale daily. Patient taking differently: Place 18 mcg into inhaler and inhale every evening.  05/24/17   Laverle Hobby, MD  zolpidem (AMBIEN) 10 MG tablet Take 10 mg by mouth at bedtime.     [provider]    Allergies Lisinopril  Family History  Problem  Relation Age of Onset  . Heart disease Father   . Heart disease Brother   . Healthy Sister   . Stroke Neg Hx     Social History Social History   Tobacco Use  . Smoking status: Current Every Day Smoker    Packs/day: 0.50    Years: 32.00    Pack years: 16.00    Types: Cigarettes  . Smokeless tobacco: Never Used  . Tobacco comment: daily  Substance Use Topics  . Alcohol use: Yes    Alcohol/week: 6.0 standard drinks    Types: 6 Cans of beer per week    Comment: weekly  . Drug use: No    Review of Systems Constitutional: Negative for fever. ENT: Negative for sore throat. Respiratory: Negative for cough or shortness of breath Gastrointestinal: No abdominal pain.  No nausea, no vomiting.  No diarrhea.  Musculoskeletal: Negative for generalized body aches. Skin: Negative for rash/lesion/wound. Neurological: Negative for headaches, focal weakness or numbness.  ____________________________________________   PHYSICAL EXAM:  VITAL SIGNS: ED Triage Vitals  Enc Vitals Group     BP 04/28/19 0703 (!) 180/81     Pulse Rate 04/28/19 0703 70     Resp 04/28/19 0703 16     Temp 04/28/19 0703 98.3 F (36.8 C)     Temp src --      SpO2 04/28/19 0703 98 %     Weight 04/28/19 0704 165 lb (74.8 kg)     Height 04/28/19 0704 5\' 6"  (1.676 m)     Head Circumference --      Peak Flow --      Pain Score 04/28/19 0704 0     Pain Loc --      Pain Edu? --      Excl. in [place]? --     Constitutional: Alert and oriented. Well appearing and in no acute distress. Eyes: Conjunctivae are normal. PERRL. EOMI. Head: Atraumatic. Nose: No congestion/rhinnorhea. Mouth/Throat: Mucous membranes are moist. Neck: No stridor.  Cardiovascular: Normal rate, regular rhythm. Good peripheral circulation. Respiratory: Normal respiratory effort. Musculoskeletal: Full ROM throughout.  Neurologic:  Normal speech and language. No gross focal neurologic deficits are appreciated. Speech is normal. No gait  instability. Skin:  Skin is warm, dry  and intact. No rash noted. Psychiatric: Mood and affect are normal. Speech and behavior are normal.  ____________________________________________   LABS (all labs ordered are listed, but only abnormal results are displayed)  Labs Reviewed  SARS CORONAVIRUS 2 (HOSPITAL ORDER, [place] [facility])   ____________________________________________  EKG  Not indicated ____________________________________________  RADIOLOGY  Not indicated ____________________________________________   PROCEDURES  None ____________________________________________   INITIAL IMPRESSION / ASSESSMENT AND PLAN / ED COURSE   58 year old male presenting to the emergency department for [place] screening prior to dialysis today.  COVID test is negative.  Patient will be discharged and will go to the dialysis center from here.  Pertinent labs & imaging results that were available during my care of the patient were reviewed by me and considered in my medical decision making (see chart for details).  ____________________________________________   FINAL CLINICAL IMPRESSION(S) / ED DIAGNOSES  Final diagnoses:  Close Exposure to Covid-19 Virus       Victorino Dike, FNP 04/28/19 [id number]    Blake Divine, MD 04/28/19 [phone]

## 2019-04-30 DIAGNOSIS — N186 End stage renal disease: Secondary | ICD-10-CM | POA: Diagnosis not present

## 2019-04-30 DIAGNOSIS — N2581 Secondary hyperparathyroidism of renal origin: Secondary | ICD-10-CM | POA: Diagnosis not present

## 2019-04-30 DIAGNOSIS — Z992 Dependence on renal dialysis: Secondary | ICD-10-CM | POA: Diagnosis not present

## 2019-04-30 DIAGNOSIS — D631 Anemia in chronic kidney disease: Secondary | ICD-10-CM | POA: Diagnosis not present

## 2019-04-30 DIAGNOSIS — D509 Iron deficiency anemia, unspecified: Secondary | ICD-10-CM | POA: Diagnosis not present

## 2019-05-01 DIAGNOSIS — N186 End stage renal disease: Secondary | ICD-10-CM | POA: Diagnosis not present

## 2019-05-01 DIAGNOSIS — D509 Iron deficiency anemia, unspecified: Secondary | ICD-10-CM | POA: Diagnosis not present

## 2019-05-01 DIAGNOSIS — N2581 Secondary hyperparathyroidism of renal origin: Secondary | ICD-10-CM | POA: Diagnosis not present

## 2019-05-01 DIAGNOSIS — D631 Anemia in chronic kidney disease: Secondary | ICD-10-CM | POA: Diagnosis not present

## 2019-05-01 DIAGNOSIS — Z992 Dependence on renal dialysis: Secondary | ICD-10-CM | POA: Diagnosis not present

## 2019-05-02 DIAGNOSIS — N186 End stage renal disease: Secondary | ICD-10-CM | POA: Diagnosis not present

## 2019-05-02 DIAGNOSIS — Z992 Dependence on renal dialysis: Secondary | ICD-10-CM | POA: Diagnosis not present

## 2019-05-02 DIAGNOSIS — N2581 Secondary hyperparathyroidism of renal origin: Secondary | ICD-10-CM | POA: Diagnosis not present

## 2019-05-02 DIAGNOSIS — D631 Anemia in chronic kidney disease: Secondary | ICD-10-CM | POA: Diagnosis not present

## 2019-05-02 DIAGNOSIS — D509 Iron deficiency anemia, unspecified: Secondary | ICD-10-CM | POA: Diagnosis not present

## 2019-05-04 DIAGNOSIS — D631 Anemia in chronic kidney disease: Secondary | ICD-10-CM | POA: Diagnosis not present

## 2019-05-04 DIAGNOSIS — N186 End stage renal disease: Secondary | ICD-10-CM | POA: Diagnosis not present

## 2019-05-04 DIAGNOSIS — N2581 Secondary hyperparathyroidism of renal origin: Secondary | ICD-10-CM | POA: Diagnosis not present

## 2019-05-04 DIAGNOSIS — D509 Iron deficiency anemia, unspecified: Secondary | ICD-10-CM | POA: Diagnosis not present

## 2019-05-04 DIAGNOSIS — Z992 Dependence on renal dialysis: Secondary | ICD-10-CM | POA: Diagnosis not present

## 2019-05-05 DIAGNOSIS — Z992 Dependence on renal dialysis: Secondary | ICD-10-CM | POA: Diagnosis not present

## 2019-05-05 DIAGNOSIS — N186 End stage renal disease: Secondary | ICD-10-CM | POA: Diagnosis not present

## 2019-05-05 DIAGNOSIS — E8779 Other fluid overload: Secondary | ICD-10-CM | POA: Diagnosis not present

## 2019-05-07 DIAGNOSIS — D509 Iron deficiency anemia, unspecified: Secondary | ICD-10-CM | POA: Diagnosis not present

## 2019-05-07 DIAGNOSIS — D631 Anemia in chronic kidney disease: Secondary | ICD-10-CM | POA: Diagnosis not present

## 2019-05-07 DIAGNOSIS — N186 End stage renal disease: Secondary | ICD-10-CM | POA: Diagnosis not present

## 2019-05-07 DIAGNOSIS — N2581 Secondary hyperparathyroidism of renal origin: Secondary | ICD-10-CM | POA: Diagnosis not present

## 2019-05-07 DIAGNOSIS — Z992 Dependence on renal dialysis: Secondary | ICD-10-CM | POA: Diagnosis not present

## 2019-05-08 ENCOUNTER — Other Ambulatory Visit: Payer: Self-pay | Admitting: Nurse Practitioner

## 2019-05-08 DIAGNOSIS — I150 Renovascular hypertension: Secondary | ICD-10-CM

## 2019-05-09 ENCOUNTER — Other Ambulatory Visit: Payer: Self-pay | Admitting: Nurse Practitioner

## 2019-05-09 DIAGNOSIS — Z992 Dependence on renal dialysis: Secondary | ICD-10-CM | POA: Diagnosis not present

## 2019-05-09 DIAGNOSIS — D509 Iron deficiency anemia, unspecified: Secondary | ICD-10-CM | POA: Diagnosis not present

## 2019-05-09 DIAGNOSIS — N186 End stage renal disease: Secondary | ICD-10-CM | POA: Diagnosis not present

## 2019-05-09 DIAGNOSIS — N2581 Secondary hyperparathyroidism of renal origin: Secondary | ICD-10-CM | POA: Diagnosis not present

## 2019-05-09 DIAGNOSIS — D631 Anemia in chronic kidney disease: Secondary | ICD-10-CM | POA: Diagnosis not present

## 2019-05-09 DIAGNOSIS — I150 Renovascular hypertension: Secondary | ICD-10-CM

## 2019-05-11 DIAGNOSIS — N186 End stage renal disease: Secondary | ICD-10-CM | POA: Diagnosis not present

## 2019-05-11 DIAGNOSIS — D631 Anemia in chronic kidney disease: Secondary | ICD-10-CM | POA: Diagnosis not present

## 2019-05-11 DIAGNOSIS — D509 Iron deficiency anemia, unspecified: Secondary | ICD-10-CM | POA: Diagnosis not present

## 2019-05-11 DIAGNOSIS — Z992 Dependence on renal dialysis: Secondary | ICD-10-CM | POA: Diagnosis not present

## 2019-05-11 DIAGNOSIS — N2581 Secondary hyperparathyroidism of renal origin: Secondary | ICD-10-CM | POA: Diagnosis not present

## 2019-05-12 DIAGNOSIS — N186 End stage renal disease: Secondary | ICD-10-CM | POA: Diagnosis not present

## 2019-05-12 DIAGNOSIS — E8779 Other fluid overload: Secondary | ICD-10-CM | POA: Diagnosis not present

## 2019-05-12 DIAGNOSIS — Z992 Dependence on renal dialysis: Secondary | ICD-10-CM | POA: Diagnosis not present

## 2019-05-14 DIAGNOSIS — Z992 Dependence on renal dialysis: Secondary | ICD-10-CM | POA: Diagnosis not present

## 2019-05-14 DIAGNOSIS — D509 Iron deficiency anemia, unspecified: Secondary | ICD-10-CM | POA: Diagnosis not present

## 2019-05-14 DIAGNOSIS — N2581 Secondary hyperparathyroidism of renal origin: Secondary | ICD-10-CM | POA: Diagnosis not present

## 2019-05-14 DIAGNOSIS — D631 Anemia in chronic kidney disease: Secondary | ICD-10-CM | POA: Diagnosis not present

## 2019-05-14 DIAGNOSIS — N186 End stage renal disease: Secondary | ICD-10-CM | POA: Diagnosis not present

## 2019-05-15 DIAGNOSIS — Z992 Dependence on renal dialysis: Secondary | ICD-10-CM | POA: Diagnosis not present

## 2019-05-15 DIAGNOSIS — N2581 Secondary hyperparathyroidism of renal origin: Secondary | ICD-10-CM | POA: Diagnosis not present

## 2019-05-15 DIAGNOSIS — D509 Iron deficiency anemia, unspecified: Secondary | ICD-10-CM | POA: Diagnosis not present

## 2019-05-15 DIAGNOSIS — D631 Anemia in chronic kidney disease: Secondary | ICD-10-CM | POA: Diagnosis not present

## 2019-05-15 DIAGNOSIS — N186 End stage renal disease: Secondary | ICD-10-CM | POA: Diagnosis not present

## 2019-05-16 DIAGNOSIS — Z992 Dependence on renal dialysis: Secondary | ICD-10-CM | POA: Diagnosis not present

## 2019-05-16 DIAGNOSIS — N186 End stage renal disease: Secondary | ICD-10-CM | POA: Diagnosis not present

## 2019-05-16 DIAGNOSIS — D509 Iron deficiency anemia, unspecified: Secondary | ICD-10-CM | POA: Diagnosis not present

## 2019-05-16 DIAGNOSIS — D631 Anemia in chronic kidney disease: Secondary | ICD-10-CM | POA: Diagnosis not present

## 2019-05-16 DIAGNOSIS — N2581 Secondary hyperparathyroidism of renal origin: Secondary | ICD-10-CM | POA: Diagnosis not present

## 2019-05-18 DIAGNOSIS — N186 End stage renal disease: Secondary | ICD-10-CM | POA: Diagnosis not present

## 2019-05-18 DIAGNOSIS — N2581 Secondary hyperparathyroidism of renal origin: Secondary | ICD-10-CM | POA: Diagnosis not present

## 2019-05-18 DIAGNOSIS — Z992 Dependence on renal dialysis: Secondary | ICD-10-CM | POA: Diagnosis not present

## 2019-05-18 DIAGNOSIS — D509 Iron deficiency anemia, unspecified: Secondary | ICD-10-CM | POA: Diagnosis not present

## 2019-05-18 DIAGNOSIS — D631 Anemia in chronic kidney disease: Secondary | ICD-10-CM | POA: Diagnosis not present

## 2019-05-19 DIAGNOSIS — Z992 Dependence on renal dialysis: Secondary | ICD-10-CM | POA: Diagnosis not present

## 2019-05-19 DIAGNOSIS — N186 End stage renal disease: Secondary | ICD-10-CM | POA: Diagnosis not present

## 2019-05-19 DIAGNOSIS — E8779 Other fluid overload: Secondary | ICD-10-CM | POA: Diagnosis not present

## 2019-05-21 DIAGNOSIS — D509 Iron deficiency anemia, unspecified: Secondary | ICD-10-CM | POA: Diagnosis not present

## 2019-05-21 DIAGNOSIS — N2581 Secondary hyperparathyroidism of renal origin: Secondary | ICD-10-CM | POA: Diagnosis not present

## 2019-05-21 DIAGNOSIS — D631 Anemia in chronic kidney disease: Secondary | ICD-10-CM | POA: Diagnosis not present

## 2019-05-21 DIAGNOSIS — N186 End stage renal disease: Secondary | ICD-10-CM | POA: Diagnosis not present

## 2019-05-21 DIAGNOSIS — Z992 Dependence on renal dialysis: Secondary | ICD-10-CM | POA: Diagnosis not present

## 2019-05-23 DIAGNOSIS — Z992 Dependence on renal dialysis: Secondary | ICD-10-CM | POA: Diagnosis not present

## 2019-05-23 DIAGNOSIS — D509 Iron deficiency anemia, unspecified: Secondary | ICD-10-CM | POA: Diagnosis not present

## 2019-05-23 DIAGNOSIS — N2581 Secondary hyperparathyroidism of renal origin: Secondary | ICD-10-CM | POA: Diagnosis not present

## 2019-05-23 DIAGNOSIS — N186 End stage renal disease: Secondary | ICD-10-CM | POA: Diagnosis not present

## 2019-05-23 DIAGNOSIS — D631 Anemia in chronic kidney disease: Secondary | ICD-10-CM | POA: Diagnosis not present

## 2019-05-25 DIAGNOSIS — N186 End stage renal disease: Secondary | ICD-10-CM | POA: Diagnosis not present

## 2019-05-25 DIAGNOSIS — D509 Iron deficiency anemia, unspecified: Secondary | ICD-10-CM | POA: Diagnosis not present

## 2019-05-25 DIAGNOSIS — D631 Anemia in chronic kidney disease: Secondary | ICD-10-CM | POA: Diagnosis not present

## 2019-05-25 DIAGNOSIS — N2581 Secondary hyperparathyroidism of renal origin: Secondary | ICD-10-CM | POA: Diagnosis not present

## 2019-05-25 DIAGNOSIS — Z992 Dependence on renal dialysis: Secondary | ICD-10-CM | POA: Diagnosis not present

## 2019-05-28 ENCOUNTER — Other Ambulatory Visit: Payer: Self-pay

## 2019-05-28 ENCOUNTER — Inpatient Hospital Stay
Admission: EM | Admit: 2019-05-28 | Discharge: 2019-06-05 | DRG: 853 | Disposition: A | Discharge status: Home / Self Care | Payer: Medicare Other | Attending: Internal Medicine | Admitting: Internal Medicine

## 2019-05-28 ENCOUNTER — Emergency Department: Payer: Medicare Other

## 2019-05-28 ENCOUNTER — Inpatient Hospital Stay: Payer: Medicare Other

## 2019-05-28 ENCOUNTER — Encounter: Payer: Self-pay | Admitting: Intensive Care

## 2019-05-28 DIAGNOSIS — D631 Anemia in chronic kidney disease: Secondary | ICD-10-CM | POA: Diagnosis present

## 2019-05-28 DIAGNOSIS — N186 End stage renal disease: Secondary | ICD-10-CM | POA: Diagnosis present

## 2019-05-28 DIAGNOSIS — Z951 Presence of aortocoronary bypass graft: Secondary | ICD-10-CM

## 2019-05-28 DIAGNOSIS — R0600 Dyspnea, unspecified: Secondary | ICD-10-CM

## 2019-05-28 DIAGNOSIS — I252 Old myocardial infarction: Secondary | ICD-10-CM | POA: Diagnosis not present

## 2019-05-28 DIAGNOSIS — Z7982 Long term (current) use of aspirin: Secondary | ICD-10-CM

## 2019-05-28 DIAGNOSIS — Z7951 Long term (current) use of inhaled steroids: Secondary | ICD-10-CM

## 2019-05-28 DIAGNOSIS — S0990XA Unspecified injury of head, initial encounter: Secondary | ICD-10-CM | POA: Diagnosis not present

## 2019-05-28 DIAGNOSIS — I340 Nonrheumatic mitral (valve) insufficiency: Secondary | ICD-10-CM | POA: Diagnosis not present

## 2019-05-28 DIAGNOSIS — R0689 Other abnormalities of breathing: Secondary | ICD-10-CM | POA: Diagnosis not present

## 2019-05-28 DIAGNOSIS — K219 Gastro-esophageal reflux disease without esophagitis: Secondary | ICD-10-CM | POA: Diagnosis present

## 2019-05-28 DIAGNOSIS — Y712 Prosthetic and other implants, materials and accessory cardiovascular devices associated with adverse incidents: Secondary | ICD-10-CM | POA: Diagnosis not present

## 2019-05-28 DIAGNOSIS — I25110 Atherosclerotic heart disease of native coronary artery with unstable angina pectoris: Secondary | ICD-10-CM | POA: Diagnosis not present

## 2019-05-28 DIAGNOSIS — I208 Other forms of angina pectoris: Secondary | ICD-10-CM | POA: Diagnosis not present

## 2019-05-28 DIAGNOSIS — F419 Anxiety disorder, unspecified: Secondary | ICD-10-CM | POA: Diagnosis present

## 2019-05-28 DIAGNOSIS — D509 Iron deficiency anemia, unspecified: Secondary | ICD-10-CM | POA: Diagnosis not present

## 2019-05-28 DIAGNOSIS — I480 Paroxysmal atrial fibrillation: Secondary | ICD-10-CM | POA: Diagnosis not present

## 2019-05-28 DIAGNOSIS — I639 Cerebral infarction, unspecified: Secondary | ICD-10-CM

## 2019-05-28 DIAGNOSIS — R7989 Other specified abnormal findings of blood chemistry: Secondary | ICD-10-CM | POA: Diagnosis not present

## 2019-05-28 DIAGNOSIS — R0781 Pleurodynia: Secondary | ICD-10-CM | POA: Diagnosis not present

## 2019-05-28 DIAGNOSIS — Z888 Allergy status to other drugs, medicaments and biological substances status: Secondary | ICD-10-CM

## 2019-05-28 DIAGNOSIS — Z23 Encounter for immunization: Secondary | ICD-10-CM | POA: Diagnosis not present

## 2019-05-28 DIAGNOSIS — E785 Hyperlipidemia, unspecified: Secondary | ICD-10-CM | POA: Diagnosis present

## 2019-05-28 DIAGNOSIS — W19XXXA Unspecified fall, initial encounter: Secondary | ICD-10-CM | POA: Diagnosis present

## 2019-05-28 DIAGNOSIS — N2581 Secondary hyperparathyroidism of renal origin: Secondary | ICD-10-CM | POA: Diagnosis present

## 2019-05-28 DIAGNOSIS — Z79899 Other long term (current) drug therapy: Secondary | ICD-10-CM

## 2019-05-28 DIAGNOSIS — F17210 Nicotine dependence, cigarettes, uncomplicated: Secondary | ICD-10-CM | POA: Diagnosis present

## 2019-05-28 DIAGNOSIS — E875 Hyperkalemia: Secondary | ICD-10-CM | POA: Diagnosis not present

## 2019-05-28 DIAGNOSIS — R069 Unspecified abnormalities of breathing: Secondary | ICD-10-CM | POA: Diagnosis not present

## 2019-05-28 DIAGNOSIS — I5032 Chronic diastolic (congestive) heart failure: Secondary | ICD-10-CM | POA: Diagnosis present

## 2019-05-28 DIAGNOSIS — T82898A Other specified complication of vascular prosthetic devices, implants and grafts, initial encounter: Secondary | ICD-10-CM | POA: Diagnosis not present

## 2019-05-28 DIAGNOSIS — R0902 Hypoxemia: Secondary | ICD-10-CM | POA: Diagnosis not present

## 2019-05-28 DIAGNOSIS — G4730 Sleep apnea, unspecified: Secondary | ICD-10-CM | POA: Diagnosis present

## 2019-05-28 DIAGNOSIS — R296 Repeated falls: Secondary | ICD-10-CM | POA: Diagnosis not present

## 2019-05-28 DIAGNOSIS — I361 Nonrheumatic tricuspid (valve) insufficiency: Secondary | ICD-10-CM | POA: Diagnosis not present

## 2019-05-28 DIAGNOSIS — I6523 Occlusion and stenosis of bilateral carotid arteries: Secondary | ICD-10-CM | POA: Diagnosis not present

## 2019-05-28 DIAGNOSIS — T82838A Hemorrhage of vascular prosthetic devices, implants and grafts, initial encounter: Secondary | ICD-10-CM | POA: Diagnosis not present

## 2019-05-28 DIAGNOSIS — Z716 Tobacco abuse counseling: Secondary | ICD-10-CM

## 2019-05-28 DIAGNOSIS — J189 Pneumonia, unspecified organism: Secondary | ICD-10-CM | POA: Diagnosis present

## 2019-05-28 DIAGNOSIS — D500 Iron deficiency anemia secondary to blood loss (chronic): Secondary | ICD-10-CM | POA: Diagnosis present

## 2019-05-28 DIAGNOSIS — F1010 Alcohol abuse, uncomplicated: Secondary | ICD-10-CM | POA: Diagnosis present

## 2019-05-28 DIAGNOSIS — I4891 Unspecified atrial fibrillation: Secondary | ICD-10-CM | POA: Diagnosis not present

## 2019-05-28 DIAGNOSIS — M069 Rheumatoid arthritis, unspecified: Secondary | ICD-10-CM | POA: Diagnosis present

## 2019-05-28 DIAGNOSIS — A419 Sepsis, unspecified organism: Secondary | ICD-10-CM | POA: Diagnosis present

## 2019-05-28 DIAGNOSIS — I132 Hypertensive heart and chronic kidney disease with heart failure and with stage 5 chronic kidney disease, or end stage renal disease: Secondary | ICD-10-CM | POA: Diagnosis present

## 2019-05-28 DIAGNOSIS — Z20828 Contact with and (suspected) exposure to other viral communicable diseases: Secondary | ICD-10-CM | POA: Diagnosis present

## 2019-05-28 DIAGNOSIS — Z8719 Personal history of other diseases of the digestive system: Secondary | ICD-10-CM

## 2019-05-28 DIAGNOSIS — Z8673 Personal history of transient ischemic attack (TIA), and cerebral infarction without residual deficits: Secondary | ICD-10-CM

## 2019-05-28 DIAGNOSIS — Z992 Dependence on renal dialysis: Secondary | ICD-10-CM | POA: Diagnosis not present

## 2019-05-28 DIAGNOSIS — I10 Essential (primary) hypertension: Secondary | ICD-10-CM | POA: Diagnosis not present

## 2019-05-28 DIAGNOSIS — J440 Chronic obstructive pulmonary disease with acute lower respiratory infection: Secondary | ICD-10-CM | POA: Diagnosis present

## 2019-05-28 DIAGNOSIS — R0602 Shortness of breath: Secondary | ICD-10-CM | POA: Diagnosis not present

## 2019-05-28 DIAGNOSIS — J9601 Acute respiratory failure with hypoxia: Secondary | ICD-10-CM | POA: Diagnosis present

## 2019-05-28 DIAGNOSIS — I169 Hypertensive crisis, unspecified: Secondary | ICD-10-CM | POA: Diagnosis not present

## 2019-05-28 DIAGNOSIS — T82510A Breakdown (mechanical) of surgically created arteriovenous fistula, initial encounter: Secondary | ICD-10-CM | POA: Diagnosis not present

## 2019-05-28 DIAGNOSIS — R079 Chest pain, unspecified: Secondary | ICD-10-CM | POA: Diagnosis not present

## 2019-05-28 DIAGNOSIS — R479 Unspecified speech disturbances: Secondary | ICD-10-CM | POA: Diagnosis not present

## 2019-05-28 DIAGNOSIS — T82590A Other mechanical complication of surgically created arteriovenous fistula, initial encounter: Secondary | ICD-10-CM

## 2019-05-28 DIAGNOSIS — I120 Hypertensive chronic kidney disease with stage 5 chronic kidney disease or end stage renal disease: Secondary | ICD-10-CM | POA: Diagnosis not present

## 2019-05-28 DIAGNOSIS — Z8249 Family history of ischemic heart disease and other diseases of the circulatory system: Secondary | ICD-10-CM

## 2019-05-28 DIAGNOSIS — I25118 Atherosclerotic heart disease of native coronary artery with other forms of angina pectoris: Secondary | ICD-10-CM | POA: Diagnosis not present

## 2019-05-28 HISTORY — DX: Personal history of pneumonia (recurrent): Z87.01

## 2019-05-28 HISTORY — DX: End stage renal disease: N18.6

## 2019-05-28 HISTORY — DX: Unspecified diastolic (congestive) heart failure: I50.30

## 2019-05-28 LAB — TROPONIN I (HIGH SENSITIVITY): Troponin I (High Sensitivity): 24 ng/L — ABNORMAL HIGH (ref <18)

## 2019-05-28 LAB — CBC WITH DIFFERENTIAL/PLATELET
Abs Immature Granulocytes: 0.04 10*3/uL (ref 0.00–0.07)
Basophils Absolute: 0 10*3/uL (ref 0.0–0.1)
Basophils Relative: 0 %
Eosinophils Absolute: 0 10*3/uL (ref 0.0–0.5)
Eosinophils Relative: 0 %
HCT: 32.3 % — ABNORMAL LOW (ref 39.0–52.0)
Hemoglobin: 11 g/dL — ABNORMAL LOW (ref 13.0–17.0)
Immature Granulocytes: 0 %
Lymphocytes Relative: 6 %
Lymphs Abs: 0.7 10*3/uL (ref 0.7–4.0)
MCH: 34.9 pg — ABNORMAL HIGH (ref 26.0–34.0)
MCHC: 34.1 g/dL (ref 30.0–36.0)
MCV: 102.5 fL — ABNORMAL HIGH (ref 80.0–100.0)
Monocytes Absolute: 1 10*3/uL (ref 0.1–1.0)
Monocytes Relative: 9 %
Neutro Abs: 9.7 10*3/uL — ABNORMAL HIGH (ref 1.7–7.7)
Neutrophils Relative %: 85 %
Platelets: 211 10*3/uL (ref 150–400)
RBC: 3.15 MIL/uL — ABNORMAL LOW (ref 4.22–5.81)
RDW: 13.6 % (ref 11.5–15.5)
WBC: 11.5 10*3/uL — ABNORMAL HIGH (ref 4.0–10.5)
nRBC: 0 % (ref 0.0–0.2)

## 2019-05-28 LAB — PHOSPHORUS: Phosphorus: 3.9 mg/dL (ref 2.5–4.6)

## 2019-05-28 LAB — BASIC METABOLIC PANEL
Anion gap: 17 — ABNORMAL HIGH (ref 5–15)
BUN: 61 mg/dL — ABNORMAL HIGH (ref 6–20)
CO2: 21 mmol/L — ABNORMAL LOW (ref 22–32)
Calcium: 9.3 mg/dL (ref 8.9–10.3)
Chloride: 93 mmol/L — ABNORMAL LOW (ref 98–111)
Creatinine, Ser: 9.38 mg/dL — ABNORMAL HIGH (ref 0.61–1.24)
GFR calc Af Amer: 6 mL/min — ABNORMAL LOW (ref >60)
GFR calc non Af Amer: 6 mL/min — ABNORMAL LOW (ref >60)
Glucose, Bld: 117 mg/dL — ABNORMAL HIGH (ref 70–99)
Potassium: 6.2 mmol/L — ABNORMAL HIGH (ref 3.5–5.1)
Sodium: 131 mmol/L — ABNORMAL LOW (ref 135–145)

## 2019-05-28 LAB — SARS CORONAVIRUS 2 BY RT PCR (HOSPITAL ORDER, PERFORMED IN ~~LOC~~ HOSPITAL LAB): SARS Coronavirus 2: NEGATIVE

## 2019-05-28 LAB — BRAIN NATRIURETIC PEPTIDE: B Natriuretic Peptide: >4500 pg/mL — ABNORMAL HIGH (ref 0.0–100.0)

## 2019-05-28 MED ORDER — IPRATROPIUM-ALBUTEROL 0.5-2.5 (3) MG/3ML IN SOLN
3.0000 mL | Freq: Once | RESPIRATORY_TRACT | Status: AC
  Administered 2019-05-28: 07:00:00 3 mL via RESPIRATORY_TRACT
  Filled 2019-05-28: qty 3

## 2019-05-28 MED ORDER — LEVOFLOXACIN IN D5W 750 MG/150ML IV SOLN
750.0000 mg | Freq: Once | INTRAVENOUS | Status: AC
  Administered 2019-05-28: 750 mg via INTRAVENOUS
  Filled 2019-05-28: qty 150

## 2019-05-28 MED ORDER — DOCUSATE SODIUM 100 MG PO CAPS: 100.0000 mg | ORAL_CAPSULE | Freq: Two times a day (BID) | ORAL | Status: DC | PRN

## 2019-05-28 MED ORDER — INFLUENZA VAC SPLIT QUAD 0.5 ML IM SUSY: 0.5000 mL | PREFILLED_SYRINGE | INTRAMUSCULAR | Status: DC

## 2019-05-28 MED ORDER — SODIUM CHLORIDE 0.9 % IV SOLN: 1.0000 g | Freq: Once | INTRAVENOUS | Status: DC

## 2019-05-28 MED ORDER — DEXTROSE 50 % IV SOLN
INTRAVENOUS | Status: AC
  Filled 2019-05-28: qty 50

## 2019-05-28 MED ORDER — ALBUTEROL SULFATE (2.5 MG/3ML) 0.083% IN NEBU
2.5000 mg | INHALATION_SOLUTION | RESPIRATORY_TRACT | Status: DC | PRN
  Administered 2019-05-28 – 2019-06-01 (×4): 2.5 mg via RESPIRATORY_TRACT
  Filled 2019-05-28 (×6): qty 3

## 2019-05-28 MED ORDER — VANCOMYCIN HCL 500 MG IV SOLR
500.0000 mg | Freq: Once | INTRAVENOUS | Status: DC
  Filled 2019-05-28: qty 500

## 2019-05-28 MED ORDER — BUDESONIDE 0.5 MG/2ML IN SUSP
0.5000 mg | Freq: Two times a day (BID) | RESPIRATORY_TRACT | Status: DC
  Administered 2019-05-28 – 2019-06-05 (×13): 0.5 mg via RESPIRATORY_TRACT
  Filled 2019-05-28 (×16): qty 2

## 2019-05-28 MED ORDER — HEPARIN SODIUM (PORCINE) 5000 UNIT/ML IJ SOLN
5000.0000 [IU] | Freq: Three times a day (TID) | INTRAMUSCULAR | Status: DC
  Administered 2019-05-28 – 2019-05-30 (×5): 5000 [IU] via SUBCUTANEOUS
  Filled 2019-05-28 (×5): qty 1

## 2019-05-28 MED ORDER — SODIUM CHLORIDE 0.9 % IV SOLN
1.0000 g | INTRAVENOUS | Status: DC
  Administered 2019-05-29 – 2019-05-31 (×3): 1 g via INTRAVENOUS
  Filled 2019-05-28 (×6): qty 1

## 2019-05-28 MED ORDER — TIOTROPIUM BROMIDE MONOHYDRATE 18 MCG IN CAPS
18.0000 ug | ORAL_CAPSULE | Freq: Every evening | RESPIRATORY_TRACT | Status: DC
  Administered 2019-05-29 – 2019-06-03 (×5): 18 ug via RESPIRATORY_TRACT
  Filled 2019-05-28 (×2): qty 5

## 2019-05-28 MED ORDER — FUROSEMIDE 40 MG PO TABS
80.0000 mg | ORAL_TABLET | Freq: Two times a day (BID) | ORAL | Status: DC
  Administered 2019-05-29 – 2019-06-05 (×13): 80 mg via ORAL
  Filled 2019-05-28: qty 2
  Filled 2019-05-28: qty 4
  Filled 2019-05-28: qty 2
  Filled 2019-05-28: qty 4
  Filled 2019-05-28: qty 2
  Filled 2019-05-28 (×3): qty 4
  Filled 2019-05-28 (×6): qty 2

## 2019-05-28 MED ORDER — CHLORHEXIDINE GLUCONATE CLOTH 2 % EX PADS
6.0000 | MEDICATED_PAD | Freq: Every day | CUTANEOUS | Status: DC
  Administered 2019-06-02 – 2019-06-05 (×3): 6 via TOPICAL
  Filled 2019-05-28: qty 6

## 2019-05-28 MED ORDER — CALCIUM GLUCONATE-NACL 1-0.675 GM/50ML-% IV SOLN
1.0000 g | Freq: Once | INTRAVENOUS | Status: AC
  Administered 2019-05-28: 1000 mg via INTRAVENOUS
  Filled 2019-05-28: qty 50

## 2019-05-28 MED ORDER — VANCOMYCIN HCL IN DEXTROSE 750-5 MG/150ML-% IV SOLN
750.0000 mg | INTRAVENOUS | Status: DC
  Filled 2019-05-28: qty 150

## 2019-05-28 MED ORDER — METHYLPREDNISOLONE SODIUM SUCC 125 MG IJ SOLR
125.0000 mg | Freq: Once | INTRAMUSCULAR | Status: AC
  Administered 2019-05-28: 07:00:00 125 mg via INTRAVENOUS
  Filled 2019-05-28: qty 2

## 2019-05-28 MED ORDER — PIPERACILLIN-TAZOBACTAM 3.375 G IVPB 30 MIN
3.3750 g | Freq: Once | INTRAVENOUS | Status: AC
  Administered 2019-05-28: 11:00:00 3.375 g via INTRAVENOUS
  Filled 2019-05-28: qty 50

## 2019-05-28 MED ORDER — AMLODIPINE BESYLATE 10 MG PO TABS
10.0000 mg | ORAL_TABLET | Freq: Every day | ORAL | Status: DC
  Administered 2019-05-28 – 2019-06-05 (×8): 10 mg via ORAL
  Filled 2019-05-28 (×5): qty 1
  Filled 2019-05-28: qty 2
  Filled 2019-05-28 (×2): qty 1

## 2019-05-28 MED ORDER — METOPROLOL TARTRATE 50 MG PO TABS
100.0000 mg | ORAL_TABLET | Freq: Two times a day (BID) | ORAL | Status: DC
  Administered 2019-05-28 – 2019-05-30 (×5): 100 mg via ORAL
  Filled 2019-05-28 (×3): qty 2
  Filled 2019-05-28 (×2): qty 4

## 2019-05-28 MED ORDER — ASPIRIN EC 81 MG PO TBEC
81.0000 mg | DELAYED_RELEASE_TABLET | Freq: Every day | ORAL | Status: DC
  Administered 2019-05-29 – 2019-06-05 (×7): 81 mg via ORAL
  Filled 2019-05-28 (×7): qty 1

## 2019-05-28 MED ORDER — ZOLPIDEM TARTRATE 5 MG PO TABS
10.0000 mg | ORAL_TABLET | Freq: Every day | ORAL | Status: DC
  Administered 2019-05-28 – 2019-06-04 (×8): 10 mg via ORAL
  Filled 2019-05-28 (×9): qty 2

## 2019-05-28 MED ORDER — ATORVASTATIN CALCIUM 20 MG PO TABS
40.0000 mg | ORAL_TABLET | Freq: Every day | ORAL | Status: DC
  Administered 2019-05-29 – 2019-06-03 (×6): 40 mg via ORAL
  Filled 2019-05-28 (×7): qty 2

## 2019-05-28 MED ORDER — GABAPENTIN 100 MG PO CAPS
100.0000 mg | ORAL_CAPSULE | Freq: Three times a day (TID) | ORAL | Status: DC
  Administered 2019-05-28 – 2019-06-05 (×20): 100 mg via ORAL
  Filled 2019-05-28 (×21): qty 1

## 2019-05-28 MED ORDER — LORAZEPAM 2 MG/ML IJ SOLN
1.0000 mg | Freq: Once | INTRAMUSCULAR | Status: AC
  Administered 2019-05-28: 1 mg via INTRAVENOUS
  Filled 2019-05-28: qty 1

## 2019-05-28 MED ORDER — IRBESARTAN 150 MG PO TABS
150.0000 mg | ORAL_TABLET | Freq: Every day | ORAL | Status: DC
  Administered 2019-05-29: 11:00:00 150 mg via ORAL
  Filled 2019-05-28: qty 1

## 2019-05-28 MED ORDER — RENA-VITE PO TABS
1.0000 | ORAL_TABLET | Freq: Every day | ORAL | Status: DC
  Administered 2019-05-29 – 2019-06-05 (×7): 1 via ORAL
  Filled 2019-05-28 (×7): qty 1

## 2019-05-28 MED ORDER — CALCIUM CARBONATE ANTACID 500 MG PO CHEW
1.0000 | CHEWABLE_TABLET | Freq: Two times a day (BID) | ORAL | Status: DC
  Administered 2019-05-29 – 2019-06-05 (×12): 200 mg via ORAL
  Filled 2019-05-28 (×14): qty 1

## 2019-05-28 MED ORDER — EPOETIN ALFA 4000 UNIT/ML IJ SOLN
4000.0000 [IU] | INTRAMUSCULAR | Status: DC
  Administered 2019-06-01: 4000 [IU] via INTRAVENOUS
  Filled 2019-05-28 (×3): qty 1

## 2019-05-28 MED ORDER — SEVELAMER CARBONATE 800 MG PO TABS
2400.0000 mg | ORAL_TABLET | Freq: Every day | ORAL | Status: DC
  Administered 2019-05-29 – 2019-06-05 (×6): 2400 mg via ORAL
  Filled 2019-05-28 (×6): qty 3

## 2019-05-28 MED ORDER — HYDRALAZINE HCL 20 MG/ML IJ SOLN
10.0000 mg | Freq: Four times a day (QID) | INTRAMUSCULAR | Status: DC | PRN
  Administered 2019-05-28 – 2019-05-30 (×4): 10 mg via INTRAVENOUS
  Filled 2019-05-28 (×4): qty 1

## 2019-05-28 MED ORDER — HEPARIN SODIUM (PORCINE) 1000 UNIT/ML DIALYSIS: 1000.0000 [IU] | INTRAMUSCULAR | Status: DC | PRN

## 2019-05-28 MED ORDER — CINACALCET HCL 30 MG PO TABS
30.0000 mg | ORAL_TABLET | Freq: Every day | ORAL | Status: DC
  Administered 2019-05-29 – 2019-06-03 (×5): 30 mg via ORAL
  Filled 2019-05-28 (×9): qty 1

## 2019-05-28 MED ORDER — LABETALOL HCL 5 MG/ML IV SOLN
10.0000 mg | Freq: Once | INTRAVENOUS | Status: AC
  Administered 2019-05-28: 07:00:00 10 mg via INTRAVENOUS
  Filled 2019-05-28: qty 4

## 2019-05-28 MED ORDER — BUDESONIDE 180 MCG/ACT IN AEPB: 1.0000 | INHALATION_SPRAY | Freq: Two times a day (BID) | RESPIRATORY_TRACT | Status: DC

## 2019-05-28 MED ORDER — VANCOMYCIN HCL IN DEXTROSE 1-5 GM/200ML-% IV SOLN
1000.0000 mg | Freq: Once | INTRAVENOUS | Status: AC
  Administered 2019-05-28: 11:00:00 1000 mg via INTRAVENOUS
  Filled 2019-05-28: qty 200

## 2019-05-28 MED ORDER — INSULIN ASPART 100 UNIT/ML ~~LOC~~ SOLN
10.0000 [IU] | Freq: Once | SUBCUTANEOUS | Status: AC
  Administered 2019-05-28: 08:00:00 10 [IU] via INTRAVENOUS
  Filled 2019-05-28: qty 1

## 2019-05-28 MED ORDER — CLONIDINE HCL 0.1 MG PO TABS
0.1000 mg | ORAL_TABLET | Freq: Two times a day (BID) | ORAL | Status: DC
  Administered 2019-05-28 – 2019-05-29 (×4): 0.1 mg via ORAL
  Filled 2019-05-28 (×4): qty 1

## 2019-05-28 MED ORDER — DEXTROSE 50 % IV SOLN
25.0000 g | Freq: Once | INTRAVENOUS | Status: AC
  Administered 2019-05-28: 08:00:00 25 g via INTRAVENOUS
  Filled 2019-05-28: qty 50

## 2019-05-28 NOTE — Progress Notes (Signed)
PT Cancellation Note  Patient Details Name: Daryl Weeks MRN: [id number] DOB: 01-Nov-1960   Cancelled Treatment:    Reason Eval/Treat Not Completed: Other (comment).  PT consult received.  Chart reviewed.  Pt currently off unit at dialysis and last noted potassium was 6.2 this morning.  Will re-attempt PT evaluation tomorrow as medically appropriate.  Leitha Bleak, PT 05/28/19, 4:18 PM [phone]

## 2019-05-28 NOTE — H&P (Signed)
[facility] NAME: Daryl Weeks    MR#:  [id number]  DATE OF BIRTH:  30-Aug-1961  DATE OF ADMISSION:  05/28/2019  PRIMARY CARE PHYSICIAN: Mikey College, NP   REQUESTING/REFERRING PHYSICIAN: Jimmye Norman  CHIEF COMPLAINT:   Chief Complaint  Patient presents with  . Shortness of Breath    HISTORY OF PRESENT ILLNESS: Costantino Rossberg  is a 58 y.o. male with a known history of CHF, ESRD on HD< CAD, HLD, Htn- lives alone and had 2 falls Saturday ( 2 days ago) which he claims to be accidental and denies any dizziness, focal weakness, numbness. Since last night he felt his speech is altered and have some weakness generalized. He woke up early for his morning HD_ spoke to his sister on phone at 4;30 am and she noticed his speech is not right. He went for HD and after 30 min they terminated treatment due to Hypoxia and high BP. In ER he was noted to have Pneumonia. COVID 19 test negative. His K is high. And he is hypoxic.   PAST MEDICAL HISTORY:   Past Medical History:  Diagnosis Date  . Anemia   . Anxiety   . Arthritis   . CHF (congestive heart failure) ([place])   . Chronic kidney disease on chronic dialysis ([place])    dialysis on m, w, f  . Colon polyps   . COPD (chronic obstructive pulmonary disease) ([place])   . Coronary artery disease    Non-ST elevation myocardial infarction in February of 2015. In the setting of hypertensive urgency and blood loss anemia. Cardiac catheterization showed significant three-vessel coronary artery disease. EF was 55% by echo. He underwent CABG at [facility] on May 19 with LIMA to LAD, Sequential SVG to OM1 and OM2, and SVG to RCA  . End stage renal disease ([place])    m-w-f   -[facility] [place]  . End stage renal disease ([place])   . ETOH abuse   . GERD (gastroesophageal reflux disease)    hx  . Hyperlipidemia   . Hypertension   . IgA nephropathy   . Lower GI bleed    Due to colon polyps. Status post resection of 14  polyps  . Non-ST elevation MI (NSTEMI) ([place])   . Pneumonia    hx  . Rheumatoid arthritis ([place])   . Shortness of breath   . Sleep apnea   . Tobacco abuse   . Tobacco abuse     PAST SURGICAL HISTORY:  Past Surgical History:  Procedure Laterality Date  . A/V FISTULAGRAM Left 10/28/2016   Procedure: A/V Fistulagram;  Surgeon: [name], MD;  Location: [facility];  Service: Cardiovascular;  Laterality: Left;  . A/V FISTULAGRAM Left 12/07/2018   Procedure: A/V FISTULAGRAM;  Surgeon: [name], MD;  Location: [facility];  Service: Cardiovascular;  Laterality: Left;  . A/V SHUNT INTERVENTION N/A 10/28/2016   Procedure: A/V Shunt Intervention;  Surgeon: [name], MD;  Location: [facility];  Service: Cardiovascular;  Laterality: N/A;  . A/V SHUNTOGRAM Left 02/01/2019   Procedure: A/V SHUNTOGRAM;  Surgeon: [name], MD;  Location: [facility];  Service: Cardiovascular;  Laterality: Left;  . AV FISTULA PLACEMENT    . CARDIAC CATHETERIZATION     RCA 90% and calcified mid LAD 80% Stenosis  . CORONARY ARTERY BYPASS GRAFT N/A 11/29/2013   Procedure: CORONARY ARTERY BYPASS GRAFTING (CABG) x 4 using endoscopically  harvested right saphenous vein and left internal mammary artery;  Surgeon: [name], MD;  Location: [facility];  Service: Open Heart Surgery;  Laterality: N/A;  . dialysis catheterr  2/15  . DIALYSIS/PERMA CATHETER REMOVAL N/A 02/15/2019   Procedure: DIALYSIS/PERMA CATHETER REMOVAL;  Surgeon: [name], MD;  Location: [facility];  Service: Cardiovascular;  Laterality: N/A;  . INSERTION OF DIALYSIS CATHETER N/A 12/14/2018   Procedure: INSERTION OF DIALYSIS CATHETER ( [place] );  Surgeon: [name], MD;  Location: [facility];  Service: Vascular;  Laterality: N/A;  . INTRAOPERATIVE TRANSESOPHAGEAL ECHOCARDIOGRAM N/A 11/29/2013   Procedure: INTRAOPERATIVE TRANSESOPHAGEAL ECHOCARDIOGRAM;  Surgeon: [name], MD;  Location: [facility];   Service: Open Heart Surgery;  Laterality: N/A;  . PERIPHERAL VASCULAR CATHETERIZATION N/A 06/12/2015   Procedure: A/V Shuntogram/Fistulagram;  Surgeon: [name], MD;  Location: [facility];  Service: Cardiovascular;  Laterality: N/A;  . PERIPHERAL VASCULAR CATHETERIZATION Left 06/12/2015   Procedure: A/V Shunt Intervention;  Surgeon: [name], MD;  Location: [facility];  Service: Cardiovascular;  Laterality: Left;  . RENAL BIOPSY Left 14  . REVISON OF ARTERIOVENOUS FISTULA Left 12/14/2018   Procedure: REVISON OF ARTERIOVENOUS FISTULA;  Surgeon: [name], MD;  Location: [facility];  Service: Vascular;  Laterality: Left;    SOCIAL HISTORY:  Social History   Tobacco Use  . Smoking status: Current Every Day Smoker    Packs/day: 0.50    Years: 32.00    Pack years: 16.00    Types: Cigarettes  . Smokeless tobacco: Never Used  . Tobacco comment: daily  Substance Use Topics  . Alcohol use: Yes    Alcohol/week: 21.0 standard drinks    Types: 21 Cans of beer per week    Comment: 3-4 beers a night    FAMILY HISTORY:  Family History  Problem Relation Age of Onset  . Heart disease Father   . Heart disease Brother   . Healthy Sister   . Stroke Neg Hx     DRUG ALLERGIES:  Allergies  Allergen Reactions  . Lisinopril     REVIEW OF SYSTEMS:   CONSTITUTIONAL: No fever, fatigue or weakness.  EYES: No blurred or double vision.  EARS, NOSE, AND THROAT: No tinnitus or ear pain.  RESPIRATORY: have cough, shortness of breath,no wheezing or hemoptysis.  CARDIOVASCULAR: No chest pain, orthopnea, edema.  GASTROINTESTINAL: No nausea, vomiting, diarrhea or abdominal pain.  GENITOURINARY: No dysuria, hematuria.  ENDOCRINE: No polyuria, nocturia,  HEMATOLOGY: No anemia, easy bruising or bleeding SKIN: No rash or lesion. MUSCULOSKELETAL: No joint pain or arthritis.   NEUROLOGIC: No tingling, numbness, weakness.  PSYCHIATRY: No anxiety or depression.   MEDICATIONS AT  HOME:  Prior to Admission medications   Medication Sig Start Date End Date Taking? Authorizing Provider  albuterol (VENTOLIN HFA) 108 (90 Base) MCG/ACT inhaler Inhale 2 puffs into the lungs every 4 (four) hours as needed for wheezing or shortness of breath.    Yes [provider]  amLODipine (NORVASC) 10 MG tablet take 1 tablet by mouth once daily 06/29/16  Yes Wellington Hampshire, MD  aspirin EC 81 MG tablet Take 1 tablet (81 mg total) by mouth daily. 05/28/14  Yes Wellington Hampshire, MD  atorvastatin (LIPITOR) 20 MG tablet take 1 tablet by mouth at bedtime 04/25/14  Yes Minna Merritts, MD  AURYXIA 1 GM 210 MG(Fe) tablet TK 1 T PO  TID BEFORE [facility] MEAL 08/24/17  Yes [provider]  budesonide (PULMICORT FLEXHALER) 180 MCG/ACT inhaler Inhale 1 puff into the lungs 2 (two) times daily. 02/21/18  Yes Mikey College, NP  budesonide (PULMICORT) 0.5 MG/2ML nebulizer solution Take 2 mLs (0.5 mg total) by nebulization every 12 (twelve) hours. 06/03/17  Yes Laverle Hobby, MD  calcium carbonate (TUMS - DOSED IN MG ELEMENTAL CALCIUM) 500 MG chewable tablet Chew 1 tablet by mouth as needed.    Yes [provider]  cinacalcet (SENSIPAR) 30 MG tablet Take 30 mg by mouth daily with supper.    Yes [provider]  cloNIDine (CATAPRES) 0.1 MG tablet TK 1 T PO BID FOR HIGH BP 08/19/17  Yes [provider]  furosemide (LASIX) 80 MG tablet Take 80 mg by mouth 2 (two) times daily.    Yes [provider]  gabapentin (NEURONTIN) 100 MG capsule TK 1 C PO TID 01/14/18  Yes [provider]  irbesartan (AVAPRO) 150 MG tablet TK 1 T PO D AT NIGHT FOR HIGH BP 09/09/17  Yes [provider]  lidocaine-prilocaine (EMLA) cream Apply 1 application topically as needed.   Yes [provider]  metoprolol tartrate (LOPRESSOR) 100 MG tablet Take 1 tablet (100 mg total) by mouth 2 (two) times daily. **NEED APPT** 05/09/19  Yes Mikey College,  NP  multivitamin (RENA-VIT) TABS tablet Take 1 tablet by mouth daily.  05/22/18  Yes [provider]  ondansetron (ZOFRAN) 4 MG tablet TK 1 T PO BID PRF NAUSEA 06/14/18  Yes [provider]  sevelamer carbonate (RENVELA) 800 MG tablet Take 2,400 mg by mouth daily.    Yes [provider]  tiotropium (SPIRIVA) 18 MCG inhalation capsule Place 1 capsule (18 mcg total) into inhaler and inhale daily. Patient taking differently: Place 18 mcg into inhaler and inhale every evening.  05/24/17  Yes Laverle Hobby, MD  zolpidem (AMBIEN) 10 MG tablet Take 10 mg by mouth at bedtime.    Yes [provider]  irbesartan (AVAPRO) 300 MG tablet Take 300 mg by mouth at bedtime.  08/19/17   [provider]      PHYSICAL EXAMINATION:   VITAL SIGNS: Blood pressure (!) 199/95, pulse 87, temperature 97.9 F (36.6 C), temperature source Oral, resp. rate 18, height 5\' 7"  (1.702 m), weight 70.3 kg, SpO2 97 %.  GENERAL:  58 y.o.-year-old patient lying in the bed with no acute distress.  EYES: Pupils equal, round, reactive to light and accommodation. No scleral icterus. Extraocular muscles intact.  HEENT: Head atraumatic, normocephalic. Oropharynx and nasopharynx clear.  NECK:  Supple, no jugular venous distention. No thyroid enlargement, no tenderness.  LUNGS: Normal breath sounds bilaterally, no wheezing, some crepitation. No use of accessory muscles of respiration.  CARDIOVASCULAR: S1, S2 normal. No murmurs, rubs, or gallops.  ABDOMEN: Soft, nontender, nondistended. Bowel sounds present. No organomegaly or mass.  EXTREMITIES: No pedal edema, cyanosis, or clubbing.  NEUROLOGIC: Cranial nerves II through XII are intact. Muscle strength 5/5 in right side and 4/5 in left side extremities. Sensation intact. Gait not checked. Speech is slightly stuttered. PSYCHIATRIC: The patient is alert and oriented x 3.  SKIN: No obvious rash, lesion, or ulcer.   LABORATORY PANEL:    CBC Recent Labs  Lab 05/28/19 0709  WBC 11.5*  HGB 11.0*  HCT 32.3*  PLT 211  MCV 102.5*  MCH 34.9*  MCHC 34.1  RDW 13.6  LYMPHSABS 0.7  MONOABS 1.0  EOSABS 0.0  BASOSABS 0.0   ------------------------------------------------------------------------------------------------------------------  Chemistries  Recent Labs  Lab 05/28/19 0709  NA 131*  K 6.2*  CL 93*  CO2 21*  GLUCOSE 117*  BUN 61*  CREATININE 9.38*  CALCIUM 9.3   ------------------------------------------------------------------------------------------------------------------ estimated creatinine clearance is 8.2 mL/min (A) (by C-G formula based on SCr of 9.38 mg/dL (H)). ------------------------------------------------------------------------------------------------------------------ No results for input(s): TSH, T4TOTAL, T3FREE, THYROIDAB in the last 72 hours.  Invalid input(s): FREET3   Coagulation profile No results for input(s): INR, PROTIME in the last 168 hours. ------------------------------------------------------------------------------------------------------------------- No results for input(s): DDIMER in the last 72 hours. -------------------------------------------------------------------------------------------------------------------  Cardiac Enzymes No results for input(s): CKMB, TROPONINI, MYOGLOBIN in the last 168 hours.  Invalid input(s): CK ------------------------------------------------------------------------------------------------------------------ Invalid input(s): POCBNP  ---------------------------------------------------------------------------------------------------------------  Urinalysis    Component Value Date/Time   COLORURINE Amber 01/12/2014 0513   COLORURINE YELLOW 11/27/2013 1309   APPEARANCEUR Cloudy 01/12/2014 0513   LABSPEC 1.028 01/12/2014 0513   PHURINE 5.0 01/12/2014 0513   PHURINE 7.5 11/27/2013 1309   GLUCOSEU 50 mg/dL 01/12/2014 0513    HGBUR 1+ 01/12/2014 0513   HGBUR SMALL (A) 11/27/2013 1309   BILIRUBINUR Negative 01/12/2014 0513   KETONESUR Trace 01/12/2014 0513   KETONESUR NEGATIVE 11/27/2013 1309   PROTEINUR >=500 01/12/2014 0513   PROTEINUR >300 (A) 11/27/2013 1309   UROBILINOGEN 0.2 11/27/2013 1309   NITRITE Negative 01/12/2014 0513   NITRITE NEGATIVE 11/27/2013 1309   LEUKOCYTESUR 3+ 01/12/2014 0513     RADIOLOGY: Dg Ribs Unilateral W/chest Left  Result Date: 05/28/2019 CLINICAL DATA:  Difficulty breathing.  Left rib pain. EXAM: LEFT RIBS AND CHEST - 3+ VIEW COMPARISON:  06/17/2016 FINDINGS: Grossly unchanged enlarged cardiac silhouette and mediastinal contours post median sternotomy and CABG. The lungs appear hyperexpanded with flattening the bilaterally diaphragms. Interval development of ill-defined heterogeneous airspace opacities within the right mid and bilateral lower lungs. Blunting the bilateral costophrenic angles without definite pleural effusion. No pneumothorax. No definite evidence of edema. No definite displaced left-sided rib fractures with special attention paid to the area demarcated by the radiopaque BB. Regional soft tissues appear normal. IMPRESSION: 1. Findings worrisome for multifocal infection, right greater than left, including atypical etiologies. A follow-up chest radiograph in 3 to 4 weeks after treatment is recommended to ensure resolution. 2. No definite displaced left-sided rib fractures special attention paid to the area demarcated by the radiopaque BB. Electronically Signed   By: Sandi Mariscal M.D.   On: 05/28/2019 08:06   Ct Head Wo Contrast  Result Date: 05/28/2019 CLINICAL DATA:  Dysarthria.  Recent fall EXAM: CT HEAD WITHOUT CONTRAST TECHNIQUE: Contiguous axial images were obtained from the base of the skull through the vertex without intravenous contrast. COMPARISON:  None. FINDINGS: Brain: There is mild diffuse atrophy. There is no intracranial mass, hemorrhage, extra-axial fluid  collection, or midline shift. There is patchy small vessel disease in the centra semiovale bilaterally. There are tiny lacunar type infarcts in the left lentiform nucleus. No acute appearing infarct is evident. Vascular: There is no hyperdense vessel. There is calcification in the carotid siphon regions. Skull: There are foci of sclerosis in the calvarium as well as scattered small lucencies. This salt and pepper appearance is indicative of hyperparathyroidism and is likely secondary to the known chronic end-stage renal disease. No evident fracture. Calcification along the falx is a benign etiology and has no clinical significance. Sinuses/Orbits: There is a retention cyst in the right frontal sinus. There is opacification of a posterior ethmoid air cell on the right. Orbits appear symmetric bilaterally except for  evidence of previous cataract removal on the right. Other: Mastoid air cells are clear. IMPRESSION: Atrophy with periventricular small vessel disease. Tiny lacunar infarcts in the left lentiform nucleus. No acute infarct. No mass or hemorrhage. There are foci of arterial vascular calcification. There are bony changes consistent with secondary hyperparathyroidism, likely due to known chronic end-stage renal disease. There are foci of paranasal sinus disease. Electronically Signed   By: Lowella Grip III M.D.   On: 05/28/2019 10:16   Mr Brain Wo Contrast  Result Date: 05/28/2019 CLINICAL DATA:  Speech difficulty, recent fall, dysarthria EXAM: MRI HEAD WITHOUT CONTRAST TECHNIQUE: Multiplanar, multiecho pulse sequences of the brain and surrounding structures were obtained without intravenous contrast. COMPARISON:  Head CT 05/28/2019 FINDINGS: Brain: Multiple sequences are significantly motion degraded, limiting evaluation. The sagittal acquired T1 weighted imaging is severely motion degraded. No evidence of acute infarct. No evidence of intracranial mass. No midline shift. No extra-axial fluid  collection identified, although significant motion degradation of the acquired axial FLAIR sequence at the level of the vertex limits evaluation. Small chronic hemorrhagic lacunar infarct within the left frontal lobe white matter. Additional small chronic hemorrhagic lacunar infarct within the left basal ganglia. An additional punctate focus of chronic microhemorrhage is questioned within the left cerebellum. Mild scattered and confluent T2/FLAIR hyperintensity within the cerebral white matter is nonspecific, but consistent with chronic small vessel ischemic disease. Moderate generalized parenchymal atrophy. Vascular: Flow voids maintained within the proximal large arterial vessels. Skull and upper cervical spine: Within limitations of motion degradation, normal marrow signal. Sinuses/Orbits: Moderate-sized mucous retention cyst versus polyp within the inferior right frontal sinus. Small bilateral maxillary sinus mucous retention cysts. No significant mastoid effusion. Other: Nonspecific 2.0 x 1.1 cm ovoid lesion within the right pre maxillary soft tissues demonstrating restricted diffusion, incompletely assessed (series 8, image 36). IMPRESSION: Significantly motion degraded examination. No evidence of acute intracranial abnormality, including acute infarct. Moderate generalized parenchymal atrophy with chronic small vessel ischemic disease. Chronic lacunar infarcts within the left frontal lobe white matter and left basal ganglia. Nonspecific 2.0 cm ovoid lesion within the right pre maxillary soft tissues, incompletely assessed. Correlate clinically and consider dedicated imaging, as warranted. Moderate sized maxillary sinus mucous retention cyst versus polyp within the inferior right frontal sinus. Electronically Signed   By: Kellie Simmering   On: 05/28/2019 12:36    EKG: Orders placed or performed during the hospital encounter of 05/28/19  . EKG 12-Lead  . EKG 12-Lead    IMPRESSION AND PLAN:  * sepsis due  to Pneumonia   Will give IV Abx.   Cx sent.    * Ac respi failure- hypoxia   Supplemental oxygen.    * Speech difficulty   Stat CT head as he had 2 falls- negative.    Get MRI brain. As symptoms started 2-3 days ago- and not major symptoms- WIll not rush on stroke work up, but will get MRI and decide further.  * Uncontrolled Htn   Cont homemeds, Give Hydralazine IV PRN  * ESRD On HD   Nephrology to help.  * Hyperkalemia   Need HD today, Nephrology aware.  * Active smoker  Counselled to Quit for 4 min.  All the records are reviewed and case discussed with ED provider. Management plans discussed with the patient, family and they are in agreement.  CODE STATUS: Full.    Code Status Orders  (From admission, onward)         Start  Ordered   05/28/19 1320  Full code  Continuous     05/28/19 1319        Code Status History    Date Active Date Inactive Code Status Order ID Comments User Context   06/12/2015 1039 06/12/2015 1425 Full Code [id number]  [name], MD Inpatient   11/29/2013 1231 12/03/2013 1810 Full Code [id number]  Caryn Bee Inpatient   Advance Care Planning Activity    Advance Directive Documentation     Most Recent Value  Type of Advance Directive  Living will  Pre-existing out of facility DNR order (yellow form or pink MOST form)  -  "MOST" Form in Place?  -       TOTAL TIME TAKING CARE OF THIS PATIENT: 50 minutes.    Vaughan Basta M.D on 05/28/2019   Between 7am to 6pm - Pager - [phone]  After 6pm go to [email or web address] - password [email or web address] [facility]  Office  [phone]  CC: Primary care physician; Mikey College, NP   Note: This dictation was prepared with Dragon dictation along with smaller phrase technology. Any transcriptional errors that result from this process are unintentional.

## 2019-05-28 NOTE — Progress Notes (Signed)
[facility], [place] 05/28/19  Subjective:   LOS: 0 No intake/output data recorded. Presents for SOB, severe HTN Dx with Pneumonia Also has Hyperkalemia Urgent HD requested   Objective:  Vital signs in last 24 hours:  Temp:  [97.9 F (36.6 C)] 97.9 F (36.6 C) (09/14 0700) Pulse Rate:  [75-98] 89 (09/14 1035) Resp:  [16-26] 19 (09/14 1035) BP: (196-228)/(94-112) 196/96 (09/14 1035) SpO2:  [87 %-99 %] 91 % (09/14 1035) Weight:  [70.3 kg] 70.3 kg (09/14 0703)  Weight change:  Filed Weights   05/28/19 0703  Weight: 70.3 kg    Intake/Output:   No intake or output data in the 24 hours ending 05/28/19 1121  Gen:   Alert, cooperative, no distress, appears older than stated age Head:   Normocephalic, without obvious abnormality, atraumatic Eyes/ENT:  conjunctiva/corneas clear,  moist oral mucus membranes Neck:  Supple,  thyroid: not enlarged, no JVD Lungs:   Crackles b/l, [place] O2 Heart:   Regular rhythm, no, rub or gallop Abdomen:   Soft, non-tender,   Extremities: no cyanosis or edema Skin:  Skin color, texture, turgor normal, no rashes or lesions Neurologic: Alert and oriented, able to answer questions appropriately     Basic Metabolic Panel:  Recent Labs  Lab 05/28/19 0709  NA 131*  K 6.2*  CL 93*  CO2 21*  GLUCOSE 117*  BUN 61*  CREATININE 9.38*  CALCIUM 9.3     CBC: Recent Labs  Lab 05/28/19 0709  WBC 11.5*  NEUTROABS 9.7*  HGB 11.0*  HCT 32.3*  MCV 102.5*  PLT 211      Lab Results  Component Value Date   HEPBSAG NEGATIVE 11/30/2013      Microbiology:  Recent Results (from the past 240 hour(s))  SARS Coronavirus 2 [facility] order, Performed in [facility] hospital lab) Nasopharyngeal Nasopharyngeal Swab     Status: None   Collection Time: 05/28/19  7:20 AM   Specimen: Nasopharyngeal Swab  Result Value Ref Range Status   SARS Coronavirus 2 NEGATIVE NEGATIVE Final    Comment: (NOTE) If result is  NEGATIVE SARS-CoV-2 target nucleic acids are NOT DETECTED. The SARS-CoV-2 RNA is generally detectable in upper and lower  respiratory specimens during the acute phase of infection. The lowest  concentration of SARS-CoV-2 viral copies this assay can detect is 250  copies / mL. A negative result does not preclude SARS-CoV-2 infection  and should not be used as the sole basis for treatment or other  patient management decisions.  A negative result may occur with  improper specimen collection / handling, submission of specimen other  than nasopharyngeal swab, presence of viral mutation(s) within the  areas targeted by this assay, and inadequate number of viral copies  (<250 copies / mL). A negative result must be combined with clinical  observations, patient history, and epidemiological information. If result is POSITIVE SARS-CoV-2 target nucleic acids are DETECTED. The SARS-CoV-2 RNA is generally detectable in upper and lower  respiratory specimens dur ing the acute phase of infection.  Positive  results are indicative of active infection with SARS-CoV-2.  Clinical  correlation with patient history and other diagnostic information is  necessary to determine patient infection status.  Positive results do  not rule out bacterial infection or co-infection with other viruses. If result is PRESUMPTIVE POSTIVE SARS-CoV-2 nucleic acids MAY BE PRESENT.   A presumptive positive result was obtained on the submitted specimen  and confirmed on repeat testing.  While 2019 novel  coronavirus  (SARS-CoV-2) nucleic acids may be present in the submitted sample  additional confirmatory testing may be necessary for epidemiological  and / or clinical management purposes  to differentiate between  SARS-CoV-2 and other Sarbecovirus currently known to infect humans.  If clinically indicated additional testing with an alternate test  methodology [id number]) is advised. The SARS-CoV-2 RNA is generally  detectable  in upper and lower respiratory sp ecimens during the acute  phase of infection. The expected result is Negative. Fact Sheet for Patients:  StrictlyIdeas.no Fact Sheet for Healthcare Providers: BankingDealers.co.za This test is not yet approved or cleared by the [place] FDA and has been authorized for detection and/or diagnosis of SARS-CoV-2 by FDA under an Emergency Use Authorization (EUA).  This EUA will remain in effect (meaning this test can be used) for the duration of the COVID-19 declaration under Section 564(b)(1) of the Act, 21 U.S.C. section 360bbb-3(b)(1), unless the authorization is terminated or revoked sooner. Performed at [facility], [address] [place], [place] [address]     Coagulation Studies: No results for input(s): LABPROT, INR in the last 72 hours.  Urinalysis: No results for input(s): COLORURINE, LABSPEC, PHURINE, GLUCOSEU, HGBUR, BILIRUBINUR, KETONESUR, PROTEINUR, UROBILINOGEN, NITRITE, LEUKOCYTESUR in the last 72 hours.  Invalid input(s): APPERANCEUR    Imaging: Dg Ribs Unilateral W/chest Left  Result Date: 05/28/2019 CLINICAL DATA:  Difficulty breathing.  Left rib pain. EXAM: LEFT RIBS AND CHEST - 3+ VIEW COMPARISON:  06/17/2016 FINDINGS: Grossly unchanged enlarged cardiac silhouette and mediastinal contours post median sternotomy and CABG. The lungs appear hyperexpanded with flattening the bilaterally diaphragms. Interval development of ill-defined heterogeneous airspace opacities within the right mid and bilateral lower lungs. Blunting the bilateral costophrenic angles without definite pleural effusion. No pneumothorax. No definite evidence of edema. No definite displaced left-sided rib fractures with special attention paid to the area demarcated by the radiopaque BB. Regional soft tissues appear normal. IMPRESSION: 1. Findings worrisome for multifocal infection, right greater than left,  including atypical etiologies. A follow-up chest radiograph in 3 to 4 weeks after treatment is recommended to ensure resolution. 2. No definite displaced left-sided rib fractures special attention paid to the area demarcated by the radiopaque BB. Electronically Signed   By: Sandi Mariscal M.D.   On: 05/28/2019 08:06   Ct Head Wo Contrast  Result Date: 05/28/2019 CLINICAL DATA:  Dysarthria.  Recent fall EXAM: CT HEAD WITHOUT CONTRAST TECHNIQUE: Contiguous axial images were obtained from the base of the skull through the vertex without intravenous contrast. COMPARISON:  None. FINDINGS: Brain: There is mild diffuse atrophy. There is no intracranial mass, hemorrhage, extra-axial fluid collection, or midline shift. There is patchy small vessel disease in the centra semiovale bilaterally. There are tiny lacunar type infarcts in the left lentiform nucleus. No acute appearing infarct is evident. Vascular: There is no hyperdense vessel. There is calcification in the carotid siphon regions. Skull: There are foci of sclerosis in the calvarium as well as scattered small lucencies. This salt and pepper appearance is indicative of hyperparathyroidism and is likely secondary to the known chronic end-stage renal disease. No evident fracture. Calcification along the falx is a benign etiology and has no clinical significance. Sinuses/Orbits: There is a retention cyst in the right frontal sinus. There is opacification of a posterior ethmoid air cell on the right. Orbits appear symmetric bilaterally except for evidence of previous cataract removal on the right. Other: Mastoid air cells are clear. IMPRESSION: Atrophy with periventricular small vessel disease. Tiny lacunar  infarcts in the left lentiform nucleus. No acute infarct. No mass or hemorrhage. There are foci of arterial vascular calcification. There are bony changes consistent with secondary hyperparathyroidism, likely due to known chronic end-stage renal disease. There are  foci of paranasal sinus disease. Electronically Signed   By: Lowella Grip III M.D.   On: 05/28/2019 10:16     Medications:   .  ceFAZolin (ANCEF) IV    . vancomycin 1,000 mg (05/28/19 1038)   . amLODipine  10 mg Oral Daily  . Chlorhexidine Gluconate Cloth  6 each Topical Q0600  . cloNIDine  0.1 mg Oral BID  . metoprolol tartrate  100 mg Oral BID   hydrALAZINE  Assessment/ Plan:  58 y.o. male with  ESRD, CAD/ CABG 2015, CHF, sleep apnea, alcohol abuse, current smoker  [facility] MWF-1// TW 70kg   Active Problems:   HCAP (healthcare-associated pneumonia)   #. ESRD with hyperkalemia Recent Labs    05/28/19 0709  CREATININE 9.38*  Schedule HD on M-W-F schedule Pre-weight 74 kg today at HD UF goal 2-3 kg as tolerated   #. Anemia of CKD  Lab Results  Component Value Date   HGB 11.0 (L) 05/28/2019  low dose EPO with HD   #. SHPTH  No results found for: PTH Lab Results  Component Value Date   PHOS 2.6 01/14/2014  follow low phos diet  #. HTN  BP expected to improve some with volume removal  # Pneumonia - Antibiotics as per Primary team - COVID-19 test neg    LOS: 0 Clydene Burack 9/14/202011:21 [place], [place]

## 2019-05-28 NOTE — Progress Notes (Signed)
TREATMENT COMPLETED TOLERATED WELL REMOVED 3L OF FLUID   05/28/19 1857  Vital Signs  Pulse Rate 92  Resp 16  BP (!) 205/95  During Hemodialysis Assessment  Blood Flow Rate (mL/min) 200 mL/min  Arterial Pressure (mmHg) -40 mmHg  Venous Pressure (mmHg) 90 mmHg  Transmembrane Pressure (mmHg) 60 mmHg  Ultrafiltration Rate (mL/min) 0 mL/min  Dialysate Flow Rate (mL/min) 600 ml/min  Conductivity: Machine  14  HD Safety Checks Performed Yes  Dialysis Fluid Bolus Normal Saline  Bolus Amount (mL) 250 mL  Intra-Hemodialysis Comments Tolerated well;Tx completed  Post-Hemodialysis Assessment  Rinseback Volume (mL) 250 mL  Dialyzer Clearance Clear  Duration of HD Treatment -hour(s) 3.5 hour(s)  Hemodialysis Intake (mL) 500 mL  UF Total -Machine (mL) 3513 mL  Net UF (mL) 3013 mL  Tolerated HD Treatment Yes  Post-Hemodialysis Comments treatment completed  Education / Care Plan  Dialysis Education Provided Yes  Documented Education in Care Plan Yes

## 2019-05-28 NOTE — ED Notes (Signed)
Patient given graham crackers and soda. 

## 2019-05-28 NOTE — ED Triage Notes (Addendum)
C/o sob that started today and left sided rib pain. EMs b/p 210/110. Only had 538ml fluid off at dialysis before EMS arrived.

## 2019-05-28 NOTE — Progress Notes (Signed)
Family Meeting Note  Advance Directive:yes  Today a meeting took place with the patient.  The following clinical team members were present during this meeting: MD  The following were discussed:Patient's diagnosis: Healthcare associated pneumonia, end-stage renal disease on hemodialysis, hyperkalemia, Patient's progosis: Unable to determine and Goals for treatment: Full code  Additional follow-up to be provided: Nephrology  Time spent during discussion: 16 minutes  Vaughan Basta, MD

## 2019-05-28 NOTE — Progress Notes (Signed)
Sent from HD center for SOB and not talking right.  Pt said- he had 2 falls on Saturday and speech is not right since last night.  Noted to have Pneumonia on Xray. BP high and K is high.  Assessment.  Broad spectrum Abx - COVID negative.  CT head negative, Get MRI after HD today. Contacted Nephrology- Need HD today. BP control with home and PRN meds.  FUll code.

## 2019-05-28 NOTE — Progress Notes (Signed)
This note also relates to the following rows which could not be included: Pulse Rate - Cannot attach notes to unvalidated device data Resp - Cannot attach notes to unvalidated device data  Hd started  

## 2019-05-28 NOTE — ED Notes (Signed)
Patient back from CT.

## 2019-05-28 NOTE — Progress Notes (Signed)
Pharmacy Antibiotic Note  Daryl Weeks is a 58 y.o. male admitted on 05/28/2019 with pneumonia.  Pharmacy has been consulted for Vancomycin and Cefepime dosing.  Plan: Vancomycin 1500mg  IV loading dose followed by Vancomycin 750mg  IV after each dialysis session.  Cefepime 1g IV q24h  Height: 5\' 7"  (170.2 cm) Weight: 155 lb (70.3 kg) IBW/kg (Calculated) : 66.1  Temp (24hrs), Avg:97.9 F (36.6 C), Min:97.9 F (36.6 C), Max:97.9 F (36.6 C)  Recent Labs  Lab 05/28/19 0709  WBC 11.5*  CREATININE 9.38*    Estimated Creatinine Clearance: 8.2 mL/min (A) (by C-G formula based on SCr of 9.38 mg/dL (H)).    Allergies  Allergen Reactions  . Lisinopril     Antimicrobials this admission: Zosyn 9/14 x 1 in ED Vancomycin 9/14 >>  Cefepime 9/14 >>  Thank you for allowing pharmacy to be a part of this patient's care.  Paulina Fusi, PharmD, BCPS 05/28/2019 11:40 AM

## 2019-05-28 NOTE — ED Provider Notes (Signed)
[facility] Emergency Department Provider Note       Time seen: ----------------------------------------- 7:10 AM on 05/28/2019 -----------------------------------------   I have reviewed the triage vital signs and the nursing notes.  HISTORY   Chief Complaint Shortness of Breath    HPI Daryl Weeks is a 58 y.o. male with a history of anemia, anxiety, CHF, chronic kidney disease, COPD, end-stage renal disease on dialysis, pneumonia, rheumatoid arthritis who presents to the ED for shortness of breath that started today.  Patient also has some left-sided rib pain after a fall yesterday.  In route he was hypertensive.  He only had 500 cc of fluid taken off at dialysis before EMS arrived.  He was noted to be hypoxic at 87%.  He does not normally wear oxygen.  Past Medical History:  Diagnosis Date  . Anemia   . Anxiety   . Arthritis   . CHF (congestive heart failure) ([place])   . Chronic kidney disease on chronic dialysis ([place])    dialysis on m, w, f  . Colon polyps   . COPD (chronic obstructive pulmonary disease) ([place])   . Coronary artery disease    Non-ST elevation myocardial infarction in February of 2015. In the setting of hypertensive urgency and blood loss anemia. Cardiac catheterization showed significant three-vessel coronary artery disease. EF was 55% by echo. He underwent CABG at [facility] on May 19 with LIMA to LAD, Sequential SVG to OM1 and OM2, and SVG to RCA  . End stage renal disease ([place])    m-w-f   -[facility] [place]  . End stage renal disease ([place])   . ETOH abuse   . GERD (gastroesophageal reflux disease)    hx  . Hyperlipidemia   . Hypertension   . IgA nephropathy   . Lower GI bleed    Due to colon polyps. Status post resection of 14 polyps  . Non-ST elevation MI (NSTEMI) ([place])   . Pneumonia    hx  . Rheumatoid arthritis ([place])   . Shortness of breath   . Sleep apnea   . Tobacco abuse   . Tobacco abuse     Patient Active Problem List    Diagnosis Date Noted  . Complication of vascular access for dialysis 11/28/2018  . COPD (chronic obstructive pulmonary disease) ([place]) 12/05/2017  . ESRD on dialysis ([place]) 10/05/2016  . IgA nephropathy 11/05/2014  . S/P CABG x 4 11/29/2013  . Chronic diastolic heart failure ([place]) 11/16/2013  . Coronary artery disease   . Hyperlipidemia   . Essential hypertension   . Dyspnea 11/20/2012  . Tobacco abuse     Past Surgical History:  Procedure Laterality Date  . A/V FISTULAGRAM Left 10/28/2016   Procedure: A/V Fistulagram;  Surgeon: [name], MD;  Location: [place] [facility];  Service: Cardiovascular;  Laterality: Left;  . A/V FISTULAGRAM Left 12/07/2018   Procedure: A/V FISTULAGRAM;  Surgeon: [name], MD;  Location: [place] [facility];  Service: Cardiovascular;  Laterality: Left;  . A/V SHUNT INTERVENTION N/A 10/28/2016   Procedure: A/V Shunt Intervention;  Surgeon: [name], MD;  Location: [place];  Service: Cardiovascular;  Laterality: N/A;  . A/V SHUNTOGRAM Left 02/01/2019   Procedure: A/V SHUNTOGRAM;  Surgeon: [name], MD;  Location: [facility] [place] [facility];  Service: Cardiovascular;  Laterality: Left;  . AV FISTULA PLACEMENT    . CARDIAC CATHETERIZATION     RCA 90% and calcified mid LAD 80% Stenosis  .  CORONARY ARTERY BYPASS GRAFT N/A 11/29/2013   Procedure: CORONARY ARTERY BYPASS GRAFTING (CABG) x 4 using endoscopically harvested right saphenous vein and left internal mammary artery;  Surgeon: [name], MD;  Location: [facility];  Service: Open Heart Surgery;  Laterality: N/A;  . dialysis catheterr  2/15  . DIALYSIS/PERMA CATHETER REMOVAL N/A 02/15/2019   Procedure: DIALYSIS/PERMA CATHETER REMOVAL;  Surgeon: [name], MD;  Location: [place] [facility] [facility];  Service: Cardiovascular;  Laterality: N/A;  . INSERTION OF DIALYSIS CATHETER N/A 12/14/2018   Procedure: INSERTION OF DIALYSIS CATHETER ( [place] );  Surgeon: [name], MD;  Location: [facility];   Service: Vascular;  Laterality: N/A;  . INTRAOPERATIVE TRANSESOPHAGEAL ECHOCARDIOGRAM N/A 11/29/2013   Procedure: INTRAOPERATIVE TRANSESOPHAGEAL ECHOCARDIOGRAM;  Surgeon: [name], MD;  Location: [facility];  Service: Open Heart Surgery;  Laterality: N/A;  . PERIPHERAL VASCULAR CATHETERIZATION N/A 06/12/2015   Procedure: A/V Shuntogram/Fistulagram;  Surgeon: [name], MD;  Location: [facility];  Service: Cardiovascular;  Laterality: N/A;  . PERIPHERAL VASCULAR CATHETERIZATION Left 06/12/2015   Procedure: A/V Shunt Intervention;  Surgeon: [name], MD;  Location: [place] [facility];  Service: Cardiovascular;  Laterality: Left;  . RENAL BIOPSY Left 14  . REVISON OF ARTERIOVENOUS FISTULA Left 12/14/2018   Procedure: REVISON OF ARTERIOVENOUS FISTULA;  Surgeon: [name], MD;  Location: [facility] OR[facility];  Service: Vascular;  Laterality: Left;    Allergies Lisinopril  Social History Social History   Tobacco Use  . Smoking status: Current Every Day Smoker    Packs/day: 0.50    Years: 32.00    Pack years: 16.00    Types: Cigarettes  . Smokeless tobacco: Never Used  . Tobacco comment: daily  Substance Use Topics  . Alcohol use: Yes    Alcohol/week: 21.0 standard drinks    Types: 21 Cans of beer per week    Comment: 3-4 beers a night  . Drug use: No   Review of Systems Constitutional: Negative for fever. Cardiovascular: Negative for chest pain. Respiratory: Positive for shortness of breath Gastrointestinal: Negative for abdominal pain, vomiting and diarrhea. Musculoskeletal: Positive for left-sided rib pain Skin: Negative for rash. Neurological: Negative for headaches, focal weakness or numbness.  All systems negative/normal/unremarkable except as stated in the HPI  ____________________________________________   PHYSICAL EXAM:  VITAL SIGNS: ED Triage Vitals  Enc Vitals Group     BP 05/28/19 0700 (!) 228/112     Pulse Rate 05/28/19 0700 98     Resp 05/28/19 0700  (!) 24     Temp 05/28/19 0700 97.9 F (36.6 C)     Temp Source 05/28/19 0700 Oral     SpO2 05/28/19 0700 (!) 87 %     Weight 05/28/19 0703 155 lb (70.3 kg)     Height 05/28/19 0703 5\' 7"  (1.702 m)     Head Circumference --      Peak Flow --      Pain Score 05/28/19 0703 10     Pain Loc --      Pain Edu? --      Excl. in [place]? --    Constitutional: Alert and oriented.  Mild to moderate distress Eyes: Conjunctivae are normal. Normal extraocular movements. ENT      Head: Normocephalic and atraumatic.      Nose: No congestion/rhinnorhea.      Mouth/Throat: Mucous membranes are moist.      Neck: No stridor. Cardiovascular: Normal rate, regular rhythm.  No murmurs, rubs, or gallops. Respiratory: Tachypnea with wheezing bilaterally Gastrointestinal: Soft and nontender. Normal bowel sounds Musculoskeletal: Nontender with normal range of motion in extremities. No lower extremity tenderness nor edema. Neurologic:  Normal speech and language. No gross focal neurologic deficits are appreciated.  Skin:  Skin is warm, dry and intact. No rash noted. Psychiatric: Mood and affect are normal. Speech and behavior are normal.  ____________________________________________  EKG: Interpreted by me.  Sinus rhythm with rate of 98 bpm, long PR interval, LVH with repolarization abnormality, possible hyperacute T waves, normal QT  ____________________________________________  ED COURSE:  As part of my medical decision making, I reviewed the following data within the [place] History obtained from family if available, nursing notes, old chart and ekg, as well as notes from prior ED visits. Patient presented for shortness of breath, we will assess with labs and imaging as indicated at this time.   Procedures  AIRAM RAIFSNIDER was evaluated in Emergency Department on 05/28/2019 for the symptoms described in the history of present illness. He was evaluated in the context of the global COVID-19  pandemic, which necessitated consideration that the patient might be at risk for infection with the SARS-CoV-2 virus that causes COVID-19. Institutional protocols and algorithms that pertain to the evaluation of patients at risk for COVID-19 are in a state of rapid change based on information released by regulatory bodies including the CDC and federal and state organizations. These policies and algorithms were followed during the patient's care in the ED.  ____________________________________________   LABS (pertinent positives/negatives)  Labs Reviewed  CBC WITH DIFFERENTIAL/PLATELET - Abnormal; Notable for the following components:      Result Value   WBC 11.5 (*)    RBC 3.15 (*)    Hemoglobin 11.0 (*)    HCT 32.3 (*)    MCV 102.5 (*)    MCH 34.9 (*)    Neutro Abs 9.7 (*)    All other components within normal limits  BASIC METABOLIC PANEL - Abnormal; Notable for the following components:   Sodium 131 (*)    Potassium 6.2 (*)    Chloride 93 (*)    CO2 21 (*)    Glucose, Bld 117 (*)    BUN 61 (*)    Creatinine, Ser 9.38 (*)    GFR calc non Af Amer 6 (*)    GFR calc Af Amer 6 (*)    Anion gap 17 (*)    All other components within normal limits  TROPONIN I (HIGH SENSITIVITY) - Abnormal; Notable for the following components:   Troponin I (High Sensitivity) 24 (*)    All other components within normal limits  SARS CORONAVIRUS 2 (HOSPITAL ORDER, [place] [facility])  BRAIN NATRIURETIC PEPTIDE   CRITICAL CARE Performed by: Laurence Aly   Total critical care time: 30 minutes  Critical care time was exclusive of separately billable procedures and treating other patients.  Critical care was necessary to treat or prevent imminent or life-threatening deterioration.  Critical care was time spent personally by me on the following activities: development of treatment plan with patient and/or surrogate as well as nursing, discussions with consultants, evaluation  of patient's response to treatment, examination of patient, obtaining history from patient or surrogate, ordering and performing treatments and interventions, ordering and review of laboratory studies, ordering and review of radiographic studies, pulse oximetry and re-evaluation of patient's condition.  RADIOLOGY Images were viewed by me  Chest x-ray IMPRESSION:  1. Findings worrisome for multifocal infection, right greater than  left, including atypical etiologies. A follow-up chest radiograph in  3 to 4 weeks after treatment is recommended to ensure resolution.  2. No definite displaced left-sided rib fractures special attention  paid to the area demarcated by the radiopaque BB.  ____________________________________________   DIFFERENTIAL DIAGNOSIS   CHF, COPD, pneumonia, COVID-19  FINAL ASSESSMENT AND PLAN  Dyspnea, multifocal pneumonia, end-stage renal disease on dialysis   Plan: The patient had presented for shortness of breath which appears to be accommodation of COPD and CHF. Patient's labs did reveal expected abnormality secondary to chronic kidney disease on dialysis.  Potassium was 6.2, he was given insulin and D50.  He was given broad-spectrum antibiotics to cover for healthcare associated pneumonia given that he is on dialysis.  Patient's imaging again revealed pneumonia, no rib fracture secondary to the fall.  He currently is requiring supplemental O2, will recommend ICU stepdown admission.   Laurence Aly, MD    Note: This note was generated in part or whole with voice recognition software. Voice recognition is usually quite accurate but there are transcription errors that can and very often do occur. I apologize for any typographical errors that were not detected and corrected.     Earleen Newport, MD 05/28/19 [phone]

## 2019-05-28 NOTE — Progress Notes (Signed)
PT Cancellation Note  Patient Details Name: JAHDIEL CORSON MRN: [id number] DOB: Apr 12, 1961   Cancelled Treatment:    Reason Eval/Treat Not Completed: (Consult received and chart reviewed. Patient remains in ER, awaiting admission to floor.  Will hold PT eval until admission process complete and patient appropriate for mobility assessment.)   Lya Holben H. Owens Shark, PT, DPT, NCS 05/28/19, 12:21 PM [phone]

## 2019-05-28 NOTE — ED Notes (Signed)
PAtient transported to CT

## 2019-05-29 ENCOUNTER — Inpatient Hospital Stay: Payer: Medicare Other

## 2019-05-29 ENCOUNTER — Inpatient Hospital Stay (HOSPITAL_COMMUNITY)
Admit: 2019-05-29 | Discharge: 2019-05-29 | Disposition: A | Discharge status: Home / Self Care | Payer: Medicare Other | Attending: Internal Medicine | Admitting: Internal Medicine

## 2019-05-29 DIAGNOSIS — I340 Nonrheumatic mitral (valve) insufficiency: Secondary | ICD-10-CM

## 2019-05-29 DIAGNOSIS — I361 Nonrheumatic tricuspid (valve) insufficiency: Secondary | ICD-10-CM

## 2019-05-29 LAB — BASIC METABOLIC PANEL
Anion gap: 12 (ref 5–15)
BUN: 37 mg/dL — ABNORMAL HIGH (ref 6–20)
CO2: 28 mmol/L (ref 22–32)
Calcium: 9.3 mg/dL (ref 8.9–10.3)
Chloride: 96 mmol/L — ABNORMAL LOW (ref 98–111)
Creatinine, Ser: 6.2 mg/dL — ABNORMAL HIGH (ref 0.61–1.24)
GFR calc Af Amer: 11 mL/min — ABNORMAL LOW (ref >60)
GFR calc non Af Amer: 9 mL/min — ABNORMAL LOW (ref >60)
Glucose, Bld: 121 mg/dL — ABNORMAL HIGH (ref 70–99)
Potassium: 5.1 mmol/L (ref 3.5–5.1)
Sodium: 136 mmol/L (ref 135–145)

## 2019-05-29 LAB — CBC
HCT: 28 % — ABNORMAL LOW (ref 39.0–52.0)
Hemoglobin: 9.6 g/dL — ABNORMAL LOW (ref 13.0–17.0)
MCH: 34.7 pg — ABNORMAL HIGH (ref 26.0–34.0)
MCHC: 34.3 g/dL (ref 30.0–36.0)
MCV: 101.1 fL — ABNORMAL HIGH (ref 80.0–100.0)
Platelets: 186 10*3/uL (ref 150–400)
RBC: 2.77 MIL/uL — ABNORMAL LOW (ref 4.22–5.81)
RDW: 13.6 % (ref 11.5–15.5)
WBC: 8.4 10*3/uL (ref 4.0–10.5)
nRBC: 0 % (ref 0.0–0.2)

## 2019-05-29 LAB — LIPID PANEL
Cholesterol: 137 mg/dL (ref 0–200)
HDL: 72 mg/dL (ref >40)
LDL Cholesterol: 54 mg/dL (ref 0–99)
Total CHOL/HDL Ratio: 1.9 RATIO
Triglycerides: 55 mg/dL (ref <150)
VLDL: 11 mg/dL (ref 0–40)

## 2019-05-29 LAB — HIV ANTIBODY (ROUTINE TESTING W REFLEX): HIV Screen 4th Generation wRfx: NONREACTIVE

## 2019-05-29 LAB — MRSA PCR SCREENING: MRSA by PCR: NEGATIVE

## 2019-05-29 MED ORDER — LORATADINE 10 MG PO TABS
10.0000 mg | ORAL_TABLET | Freq: Every day | ORAL | Status: DC
  Administered 2019-05-29 – 2019-06-05 (×7): 10 mg via ORAL
  Filled 2019-05-29 (×7): qty 1

## 2019-05-29 MED ORDER — CLONIDINE HCL 0.1 MG PO TABS
0.2000 mg | ORAL_TABLET | Freq: Two times a day (BID) | ORAL | Status: DC
  Administered 2019-05-29 – 2019-06-01 (×6): 0.2 mg via ORAL
  Filled 2019-05-29 (×6): qty 2

## 2019-05-29 MED ORDER — IRBESARTAN 150 MG PO TABS
300.0000 mg | ORAL_TABLET | Freq: Every day | ORAL | Status: DC
  Administered 2019-05-29 – 2019-06-04 (×7): 300 mg via ORAL
  Filled 2019-05-29 (×7): qty 2

## 2019-05-29 MED ORDER — ACETAMINOPHEN 325 MG PO TABS
650.0000 mg | ORAL_TABLET | Freq: Four times a day (QID) | ORAL | Status: DC | PRN
  Administered 2019-05-29 – 2019-06-05 (×10): 650 mg via ORAL
  Filled 2019-05-29 (×10): qty 2

## 2019-05-29 NOTE — Progress Notes (Signed)
[place], [place] 05/29/19  Subjective:   LOS: 1 09/14 0701 - 09/15 0700 In: 240 [P.O.:240] Out: 3013  Presents for SOB, severe HTN, underwent urgent hemodialysis yesterday Feels better this afternoon 3 L was removed with dialysis Breathing is better   Objective:  Vital signs in last 24 hours:  Temp:  [97.9 F (36.6 C)-99.1 F (37.3 C)] 98.4 F (36.9 C) (09/15 0522) Pulse Rate:  [74-92] 84 (09/15 1027) Resp:  [15-29] 18 (09/15 0522) BP: (175-208)/(77-102) 176/86 (09/15 1027) SpO2:  [96 %-99 %] 99 % (09/15 0522)  Weight change:  Filed Weights   05/28/19 0703  Weight: 70.3 kg    Intake/Output:    Intake/Output Summary (Last 24 hours) at 05/29/2019 1555 Last data filed at 05/29/2019 1357 Gross per 24 hour  Intake 480 ml  Output 3013 ml  Net -2533 ml    Gen:   Alert, cooperative, no distress, appears older than stated age Head:   Normocephalic, without obvious abnormality, atraumatic Eyes/ENT:  conjunctiva/corneas clear,  moist oral mucus membranes Neck:  Supple,  thyroid: not enlarged, no JVD Lungs:   Crackles b/l, [place] O2 Heart:   Regular rhythm, no, rub or gallop Abdomen:   Soft, non-tender,   Extremities: no cyanosis or edema Neurologic: Alert and oriented, able to answer questions appropriately     Basic Metabolic Panel:  Recent Labs  Lab 05/28/19 0709 05/28/19 1452 05/29/19 0340  NA 131*  --  136  K 6.2*  --  5.1  CL 93*  --  96*  CO2 21*  --  28  GLUCOSE 117*  --  121*  BUN 61*  --  37*  CREATININE 9.38*  --  6.20*  CALCIUM 9.3  --  9.3  PHOS  --  3.9  --      CBC: Recent Labs  Lab 05/28/19 0709 05/29/19 0340  WBC 11.5* 8.4  NEUTROABS 9.7*  --   HGB 11.0* 9.6*  HCT 32.3* 28.0*  MCV 102.5* 101.1*  PLT 211 186      Lab Results  Component Value Date   HEPBSAG NEGATIVE 11/30/2013      Microbiology:  Recent Results (from the past 240 hour(s))  SARS Coronavirus 2 [facility] order, Performed in  [facility] hospital lab) Nasopharyngeal Nasopharyngeal Swab     Status: None   Collection Time: 05/28/19  7:20 AM   Specimen: Nasopharyngeal Swab  Result Value Ref Range Status   SARS Coronavirus 2 NEGATIVE NEGATIVE Final    Comment: (NOTE) If result is NEGATIVE SARS-CoV-2 target nucleic acids are NOT DETECTED. The SARS-CoV-2 RNA is generally detectable in upper and lower  respiratory specimens during the acute phase of infection. The lowest  concentration of SARS-CoV-2 viral copies this assay can detect is 250  copies / mL. A negative result does not preclude SARS-CoV-2 infection  and should not be used as the sole basis for treatment or other  patient management decisions.  A negative result may occur with  improper specimen collection / handling, submission of specimen other  than nasopharyngeal swab, presence of viral mutation(s) within the  areas targeted by this assay, and inadequate number of viral copies  (<250 copies / mL). A negative result must be combined with clinical  observations, patient history, and epidemiological information. If result is POSITIVE SARS-CoV-2 target nucleic acids are DETECTED. The SARS-CoV-2 RNA is generally detectable in upper and lower  respiratory specimens dur ing the acute phase of infection.  Positive  results are indicative of active infection with SARS-CoV-2.  Clinical  correlation with patient history and other diagnostic information is  necessary to determine patient infection status.  Positive results do  not rule out bacterial infection or co-infection with other viruses. If result is PRESUMPTIVE POSTIVE SARS-CoV-2 nucleic acids MAY BE PRESENT.   A presumptive positive result was obtained on the submitted specimen  and confirmed on repeat testing.  While 2019 novel coronavirus  (SARS-CoV-2) nucleic acids may be present in the submitted sample  additional confirmatory testing may be necessary for epidemiological  and / or clinical  management purposes  to differentiate between  SARS-CoV-2 and other Sarbecovirus currently known to infect humans.  If clinically indicated additional testing with an alternate test  methodology [id number]) is advised. The SARS-CoV-2 RNA is generally  detectable in upper and lower respiratory sp ecimens during the acute  phase of infection. The expected result is Negative. Fact Sheet for Patients:  StrictlyIdeas.no Fact Sheet for Healthcare Providers: BankingDealers.co.za This test is not yet approved or cleared by the [place] FDA and has been authorized for detection and/or diagnosis of SARS-CoV-2 by FDA under an Emergency Use Authorization (EUA).  This EUA will remain in effect (meaning this test can be used) for the duration of the COVID-19 declaration under Section 564(b)(1) of the Act, 21 U.S.C. section 360bbb-3(b)(1), unless the authorization is terminated or revoked sooner. Performed at [facility], [address] [place], [place] [address]   MRSA PCR Screening     Status: None   Collection Time: 05/28/19 10:36 AM   Specimen: Nasal Mucosa; Nasopharyngeal  Result Value Ref Range Status   MRSA by PCR NEGATIVE NEGATIVE Final    Comment:        The GeneXpert MRSA Assay (FDA approved for NASAL specimens only), is one component of a comprehensive MRSA colonization surveillance program. It is not intended to diagnose MRSA infection nor to guide or monitor treatment for MRSA infections. Performed at [facility], [address] [place], [place] [address]     Coagulation Studies: No results for input(s): LABPROT, INR in the last 72 hours.  Urinalysis: No results for input(s): COLORURINE, LABSPEC, PHURINE, GLUCOSEU, HGBUR, BILIRUBINUR, KETONESUR, PROTEINUR, UROBILINOGEN, NITRITE, LEUKOCYTESUR in the last 72 hours.  Invalid input(s): APPERANCEUR    Imaging: Dg Chest 2 View  Result Date:  05/29/2019 CLINICAL DATA:  History of recent falls. EXAM: CHEST - 2 VIEW COMPARISON:  Rib series 05/28/2019. FINDINGS: Prior CABG. Stable cardiomegaly. Persistent but improved multifocal bilateral pulmonary infiltrates/edema. No pleural effusion or pneumothorax. Diffuse osteopenia. Thoracic spine difficult to evaluate due to osteopenia and overlying infiltrates. No evidence of displaced rib fracture. IMPRESSION: 1.  Prior CABG.  Stable cardiomegaly. 2. Persistent but improved multifocal bilateral pulmonary infiltrates/edema. Electronically Signed   By: Marcello Moores  Register   On: 05/29/2019 10:02   Dg Ribs Unilateral W/chest Left  Result Date: 05/28/2019 CLINICAL DATA:  Difficulty breathing.  Left rib pain. EXAM: LEFT RIBS AND CHEST - 3+ VIEW COMPARISON:  06/17/2016 FINDINGS: Grossly unchanged enlarged cardiac silhouette and mediastinal contours post median sternotomy and CABG. The lungs appear hyperexpanded with flattening the bilaterally diaphragms. Interval development of ill-defined heterogeneous airspace opacities within the right mid and bilateral lower lungs. Blunting the bilateral costophrenic angles without definite pleural effusion. No pneumothorax. No definite evidence of edema. No definite displaced left-sided rib fractures with special attention paid to the area demarcated by the radiopaque BB. Regional soft tissues appear normal. IMPRESSION: 1. Findings worrisome  for multifocal infection, right greater than left, including atypical etiologies. A follow-up chest radiograph in 3 to 4 weeks after treatment is recommended to ensure resolution. 2. No definite displaced left-sided rib fractures special attention paid to the area demarcated by the radiopaque BB. Electronically Signed   By: Sandi Mariscal M.D.   On: 05/28/2019 08:06   Ct Head Wo Contrast  Result Date: 05/28/2019 CLINICAL DATA:  Dysarthria.  Recent fall EXAM: CT HEAD WITHOUT CONTRAST TECHNIQUE: Contiguous axial images were obtained from the  base of the skull through the vertex without intravenous contrast. COMPARISON:  None. FINDINGS: Brain: There is mild diffuse atrophy. There is no intracranial mass, hemorrhage, extra-axial fluid collection, or midline shift. There is patchy small vessel disease in the centra semiovale bilaterally. There are tiny lacunar type infarcts in the left lentiform nucleus. No acute appearing infarct is evident. Vascular: There is no hyperdense vessel. There is calcification in the carotid siphon regions. Skull: There are foci of sclerosis in the calvarium as well as scattered small lucencies. This salt and pepper appearance is indicative of hyperparathyroidism and is likely secondary to the known chronic end-stage renal disease. No evident fracture. Calcification along the falx is a benign etiology and has no clinical significance. Sinuses/Orbits: There is a retention cyst in the right frontal sinus. There is opacification of a posterior ethmoid air cell on the right. Orbits appear symmetric bilaterally except for evidence of previous cataract removal on the right. Other: Mastoid air cells are clear. IMPRESSION: Atrophy with periventricular small vessel disease. Tiny lacunar infarcts in the left lentiform nucleus. No acute infarct. No mass or hemorrhage. There are foci of arterial vascular calcification. There are bony changes consistent with secondary hyperparathyroidism, likely due to known chronic end-stage renal disease. There are foci of paranasal sinus disease. Electronically Signed   By: Lowella Grip III M.D.   On: 05/28/2019 10:16   Mr Brain Wo Contrast  Result Date: 05/28/2019 CLINICAL DATA:  Speech difficulty, recent fall, dysarthria EXAM: MRI HEAD WITHOUT CONTRAST TECHNIQUE: Multiplanar, multiecho pulse sequences of the brain and surrounding structures were obtained without intravenous contrast. COMPARISON:  Head CT 05/28/2019 FINDINGS: Brain: Multiple sequences are significantly motion degraded, limiting  evaluation. The sagittal acquired T1 weighted imaging is severely motion degraded. No evidence of acute infarct. No evidence of intracranial mass. No midline shift. No extra-axial fluid collection identified, although significant motion degradation of the acquired axial FLAIR sequence at the level of the vertex limits evaluation. Small chronic hemorrhagic lacunar infarct within the left frontal lobe white matter. Additional small chronic hemorrhagic lacunar infarct within the left basal ganglia. An additional punctate focus of chronic microhemorrhage is questioned within the left cerebellum. Mild scattered and confluent T2/FLAIR hyperintensity within the cerebral white matter is nonspecific, but consistent with chronic small vessel ischemic disease. Moderate generalized parenchymal atrophy. Vascular: Flow voids maintained within the proximal large arterial vessels. Skull and upper cervical spine: Within limitations of motion degradation, normal marrow signal. Sinuses/Orbits: Moderate-sized mucous retention cyst versus polyp within the inferior right frontal sinus. Small bilateral maxillary sinus mucous retention cysts. No significant mastoid effusion. Other: Nonspecific 2.0 x 1.1 cm ovoid lesion within the right pre maxillary soft tissues demonstrating restricted diffusion, incompletely assessed (series 8, image 36). IMPRESSION: Significantly motion degraded examination. No evidence of acute intracranial abnormality, including acute infarct. Moderate generalized parenchymal atrophy with chronic small vessel ischemic disease. Chronic lacunar infarcts within the left frontal lobe white matter and left basal ganglia. Nonspecific 2.0 cm ovoid lesion  within the right pre maxillary soft tissues, incompletely assessed. Correlate clinically and consider dedicated imaging, as warranted. Moderate sized maxillary sinus mucous retention cyst versus polyp within the inferior right frontal sinus. Electronically Signed   By: Kellie Simmering   On: 05/28/2019 12:36     Medications:   . ceFEPime (MAXIPIME) IV    . vancomycin     . amLODipine  10 mg Oral Daily  . aspirin EC  81 mg Oral Daily  . atorvastatin  40 mg Oral q1800  . budesonide  0.5 mg Nebulization Q12H  . calcium carbonate  1 tablet Oral BID WC  . Chlorhexidine Gluconate Cloth  6 each Topical Q0600  . cinacalcet  30 mg Oral Q supper  . cloNIDine  0.1 mg Oral BID  . [START ON 05/30/2019] epoetin (EPOGEN/PROCRIT) injection  4,000 Units Intravenous Q M,W,F-HD  . furosemide  80 mg Oral BID  . gabapentin  100 mg Oral TID  . heparin  5,000 Units Subcutaneous Q8H  . influenza vac split quadrivalent PF  0.5 mL Intramuscular Tomorrow-1000  . irbesartan  150 mg Oral Daily  . loratadine  10 mg Oral Daily  . metoprolol tartrate  100 mg Oral BID  . multivitamin  1 tablet Oral Daily  . sevelamer carbonate  2,400 mg Oral Daily  . tiotropium  18 mcg Inhalation QPM  . zolpidem  10 mg Oral QHS   acetaminophen, albuterol, docusate sodium, hydrALAZINE  Assessment/ Plan:  58 y.o. male with  ESRD, CAD/ CABG 2015, CHF, sleep apnea, alcohol abuse, current smoker, history of chronic lacunar infarcts within left frontal lobe white matter and left basal ganglia  [facility] MWF-1// TW 70kg   Active Problems:   HCAP (healthcare-associated pneumonia)   #. ESRD with hyperkalemia Recent Labs    05/28/19 0709 05/29/19 0340  CREATININE 9.38* 6.20*  Schedule HD on M-W-F schedule Last weight is close to baseline Next hemodialysis will be scheduled for tomorrow   #. Anemia of CKD  Lab Results  Component Value Date   HGB 9.6 (L) 05/29/2019  low dose EPO with HD   #. SHPTH  No results found for: PTH Lab Results  Component Value Date   PHOS 3.9 05/28/2019  follow low phos diet  #. HTN  Isolated systolic Continue home medications Increase dose of irbesartan to 300 mg at bedtime  # Pneumonia - Antibiotics as per Primary team - COVID-19  test neg    LOS: 1 Maxey Ransom [facility] 9/15/20203:55 [place], [place]

## 2019-05-29 NOTE — Progress Notes (Signed)
Established hemodialysis patient known at [facility] MWF 05:45, patient drives self to treatments. Please contact me directly with any dialysis placement concerns.   Elvera Bicker Dialysis Coordinator  [phone]

## 2019-05-29 NOTE — Plan of Care (Signed)
Patient doing well today.  No pain.  Oxygen has been weaned to RA.  Tolerating his diet well.  No significant changes.

## 2019-05-29 NOTE — Evaluation (Signed)
Physical Therapy Evaluation Patient Details Name: Daryl Weeks MRN: [id number] DOB: Apr 25, 1961 Today's Date: 05/29/2019   History of Present Illness  58 y.o. male with a known history of CHF, ESRD on HD< CAD, HLD, HTN, had 2 falls 9/12 (denies dizziness). He felt his speech is altered and have some weakness generalized. He went for HD (drove himself) and after 30 min they terminated treatment due to Hypoxia and high BP.  Clinical Impression  Pt here with shortness of breath and chest pain, feeling somewhat better at time of PT exam.  He was able to ambulate ~175 ft and negotiate up/down steps w/o direct assist.  Cuing for breathing and consistent cadence.  On arrival sats 96% on room air, did a few minutes of bed exercise/assessment on room air with slow drop in O2 sats (down to 92%) and reapplied O2 for mobility/ambulation.  Pt was physically able to do all he needed safely and apart from need for O2 (sats stayed mid 90s t/o activity on 2L) reports feeling as though he is near his baseline.  Pt does not require further PT intervention, safe to return home once medically cleared.    Follow Up Recommendations No PT follow up    Equipment Recommendations  None recommended by PT    Recommendations for Other Services       Precautions / Restrictions Precautions Precautions: Fall Restrictions Weight Bearing Restrictions: No      Mobility  Bed Mobility Overal bed mobility: Independent             General bed mobility comments: easily transitions to sitting EOB w/o assist  Transfers Overall transfer level: Independent Equipment used: None             General transfer comment: Able to rise and confidently maintain balance w/o UEs or assist  Ambulation/Gait Ambulation/Gait assistance: Modified independent (Device/Increase time) Gait Distance (Feet): 175 Feet Assistive device: None       General Gait Details: Pt able to walk with consistent cadence and speed, no LOBs or  safety concerns.  Only minimal fatigue with O2 remaining in the mid 90s (on 2L) t/o the effort.    Stairs Stairs: Yes Stairs assistance: Modified independent (Device/Increase time) Stair Management: One rail Right Number of Stairs: 6 General stair comments: Pt was able to negotiate up/down steps w/o hesitation or safety concerns.  Wheelchair Mobility    Modified Rankin (Stroke Patients Only)       Balance Overall balance assessment: Independent                                           Pertinent Vitals/Pain Pain Assessment: (mild chest/rib pain from coughing (and recent fall))    Home Living Family/patient expects to be discharged to:: Private residence Living Arrangements: Alone Available Help at Discharge: Family(sister can help if needed)   Home Access: Stairs to enter Entrance Stairs-Rails: Right Entrance Stairs-Number of Steps: 5 Home Layout: One level Home Equipment: None      Prior Function Level of Independence: Independent         Comments: Pt drives, mows his lawn, overall able to do all he needs     Hand Dominance        Extremity/Trunk Assessment                Communication   Communication: No difficulties  Cognition Arousal/Alertness: Awake/alert  Behavior During Therapy: WFL for tasks assessed/performed Overall Cognitive Status: Within Functional Limits for tasks assessed                                        General Comments      Exercises     Assessment/Plan    PT Assessment Patent does not need any further PT services  PT Problem List Decreased activity tolerance;Cardiopulmonary status limiting activity       PT Treatment Interventions      PT Goals (Current goals can be found in the Care Plan section)  Acute Rehab PT Goals Patient Stated Goal: go home PT Goal Formulation: All assessment and education complete, DC therapy    Frequency     Barriers to discharge         Co-evaluation               AM-PAC PT "6 Clicks" Mobility  Outcome Measure Help needed turning from your back to your side while in a flat bed without using bedrails?: None Help needed moving from lying on your back to sitting on the side of a flat bed without using bedrails?: None Help needed moving to and from a bed to a chair (including a wheelchair)?: None Help needed standing up from a chair using your arms (e.g., wheelchair or bedside chair)?: None Help needed to walk in hospital room?: None Help needed climbing 3-5 steps with a railing? : None 6 Click Score: 24    End of Session Equipment Utilized During Treatment: Gait belt Activity Tolerance: Patient tolerated treatment well Patient left: with chair alarm set;with call bell/phone within reach Nurse Communication: Mobility status PT Visit Diagnosis: Muscle weakness (generalized) (M62.81);Difficulty in walking, not elsewhere classified (R26.2)    Time: [id number] PT Time Calculation (min) (ACUTE ONLY): 27 min   Charges:   PT Evaluation $PT Eval Low Complexity: 1 Low PT Treatments $Gait Training: 8-22 mins        Kreg Shropshire, DPT 05/29/2019, 11:17 AM

## 2019-05-29 NOTE — Progress Notes (Signed)
[facility] [place] NAME: Phong Meads    MR#:  [id number]  DATE OF BIRTH:  1960/12/14  SUBJECTIVE:  CHIEF COMPLAINT:   Chief Complaint  Patient presents with  . Shortness of Breath   Came with SOB, PNA- had HD and feels better today, still have cough and congestion.  REVIEW OF SYSTEMS:  CONSTITUTIONAL: No fever, fatigue or weakness.  EYES: No blurred or double vision.  EARS, NOSE, AND THROAT: No tinnitus or ear pain.  RESPIRATORY: have cough, shortness of breath, no wheezing or hemoptysis.  CARDIOVASCULAR: No chest pain, orthopnea, edema.  GASTROINTESTINAL: No nausea, vomiting, diarrhea or abdominal pain.  GENITOURINARY: No dysuria, hematuria.  ENDOCRINE: No polyuria, nocturia,  HEMATOLOGY: No anemia, easy bruising or bleeding SKIN: No rash or lesion. MUSCULOSKELETAL: No joint pain or arthritis.   NEUROLOGIC: No tingling, numbness, weakness.  PSYCHIATRY: No anxiety or depression.   ROS  DRUG ALLERGIES:   Allergies  Allergen Reactions  . Lisinopril     VITALS:  Blood pressure (!) 174/87, pulse 70, temperature 98 F (36.7 C), temperature source Oral, resp. rate 18, height 5\' 7"  (1.702 m), weight 70.3 kg, SpO2 98 %.  PHYSICAL EXAMINATION:  GENERAL:  58 y.o.-year-old patient lying in the bed with no acute distress.  EYES: Pupils equal, round, reactive to light and accommodation. No scleral icterus. Extraocular muscles intact.  HEENT: Head atraumatic, normocephalic. Oropharynx and nasopharynx clear.  NECK:  Supple, no jugular venous distention. No thyroid enlargement, no tenderness.  LUNGS: Normal breath sounds bilaterally, no wheezing, some crepitation. No use of accessory muscles of respiration.  CARDIOVASCULAR: S1, S2 normal. No murmurs, rubs, or gallops.  ABDOMEN: Soft, nontender, nondistended. Bowel sounds present. No organomegaly or mass.  EXTREMITIES: No pedal edema, cyanosis, or clubbing.  NEUROLOGIC: Cranial nerves II  through XII are intact. Muscle strength 5/5 in all extremities. Sensation intact. Gait not checked.  PSYCHIATRIC: The patient is alert and oriented x 3.  SKIN: No obvious rash, lesion, or ulcer.   Physical Exam LABORATORY PANEL:   CBC Recent Labs  Lab 05/29/19 0340  WBC 8.4  HGB 9.6*  HCT 28.0*  PLT 186   ------------------------------------------------------------------------------------------------------------------  Chemistries  Recent Labs  Lab 05/29/19 0340  NA 136  K 5.1  CL 96*  CO2 28  GLUCOSE 121*  BUN 37*  CREATININE 6.20*  CALCIUM 9.3   ------------------------------------------------------------------------------------------------------------------  Cardiac Enzymes No results for input(s): TROPONINI in the last 168 hours. ------------------------------------------------------------------------------------------------------------------  RADIOLOGY:  Dg Chest 2 View  Result Date: 05/29/2019 CLINICAL DATA:  History of recent falls. EXAM: CHEST - 2 VIEW COMPARISON:  Rib series 05/28/2019. FINDINGS: Prior CABG. Stable cardiomegaly. Persistent but improved multifocal bilateral pulmonary infiltrates/edema. No pleural effusion or pneumothorax. Diffuse osteopenia. Thoracic spine difficult to evaluate due to osteopenia and overlying infiltrates. No evidence of displaced rib fracture. IMPRESSION: 1.  Prior CABG.  Stable cardiomegaly. 2. Persistent but improved multifocal bilateral pulmonary infiltrates/edema. Electronically Signed   By: Marcello Moores  Register   On: 05/29/2019 10:02   Dg Ribs Unilateral W/chest Left  Result Date: 05/28/2019 CLINICAL DATA:  Difficulty breathing.  Left rib pain. EXAM: LEFT RIBS AND CHEST - 3+ VIEW COMPARISON:  06/17/2016 FINDINGS: Grossly unchanged enlarged cardiac silhouette and mediastinal contours post median sternotomy and CABG. The lungs appear hyperexpanded with flattening the bilaterally diaphragms. Interval development of ill-defined  heterogeneous airspace opacities within the right mid and bilateral lower lungs. Blunting the bilateral costophrenic angles without definite pleural effusion.  No pneumothorax. No definite evidence of edema. No definite displaced left-sided rib fractures with special attention paid to the area demarcated by the radiopaque BB. Regional soft tissues appear normal. IMPRESSION: 1. Findings worrisome for multifocal infection, right greater than left, including atypical etiologies. A follow-up chest radiograph in 3 to 4 weeks after treatment is recommended to ensure resolution. 2. No definite displaced left-sided rib fractures special attention paid to the area demarcated by the radiopaque BB. Electronically Signed   By: Sandi Mariscal M.D.   On: 05/28/2019 08:06   Ct Head Wo Contrast  Result Date: 05/28/2019 CLINICAL DATA:  Dysarthria.  Recent fall EXAM: CT HEAD WITHOUT CONTRAST TECHNIQUE: Contiguous axial images were obtained from the base of the skull through the vertex without intravenous contrast. COMPARISON:  None. FINDINGS: Brain: There is mild diffuse atrophy. There is no intracranial mass, hemorrhage, extra-axial fluid collection, or midline shift. There is patchy small vessel disease in the centra semiovale bilaterally. There are tiny lacunar type infarcts in the left lentiform nucleus. No acute appearing infarct is evident. Vascular: There is no hyperdense vessel. There is calcification in the carotid siphon regions. Skull: There are foci of sclerosis in the calvarium as well as scattered small lucencies. This salt and pepper appearance is indicative of hyperparathyroidism and is likely secondary to the known chronic end-stage renal disease. No evident fracture. Calcification along the falx is a benign etiology and has no clinical significance. Sinuses/Orbits: There is a retention cyst in the right frontal sinus. There is opacification of a posterior ethmoid air cell on the right. Orbits appear symmetric  bilaterally except for evidence of previous cataract removal on the right. Other: Mastoid air cells are clear. IMPRESSION: Atrophy with periventricular small vessel disease. Tiny lacunar infarcts in the left lentiform nucleus. No acute infarct. No mass or hemorrhage. There are foci of arterial vascular calcification. There are bony changes consistent with secondary hyperparathyroidism, likely due to known chronic end-stage renal disease. There are foci of paranasal sinus disease. Electronically Signed   By: Lowella Grip III M.D.   On: 05/28/2019 10:16   Mr Brain Wo Contrast  Result Date: 05/28/2019 CLINICAL DATA:  Speech difficulty, recent fall, dysarthria EXAM: MRI HEAD WITHOUT CONTRAST TECHNIQUE: Multiplanar, multiecho pulse sequences of the brain and surrounding structures were obtained without intravenous contrast. COMPARISON:  Head CT 05/28/2019 FINDINGS: Brain: Multiple sequences are significantly motion degraded, limiting evaluation. The sagittal acquired T1 weighted imaging is severely motion degraded. No evidence of acute infarct. No evidence of intracranial mass. No midline shift. No extra-axial fluid collection identified, although significant motion degradation of the acquired axial FLAIR sequence at the level of the vertex limits evaluation. Small chronic hemorrhagic lacunar infarct within the left frontal lobe white matter. Additional small chronic hemorrhagic lacunar infarct within the left basal ganglia. An additional punctate focus of chronic microhemorrhage is questioned within the left cerebellum. Mild scattered and confluent T2/FLAIR hyperintensity within the cerebral white matter is nonspecific, but consistent with chronic small vessel ischemic disease. Moderate generalized parenchymal atrophy. Vascular: Flow voids maintained within the proximal large arterial vessels. Skull and upper cervical spine: Within limitations of motion degradation, normal marrow signal. Sinuses/Orbits:  Moderate-sized mucous retention cyst versus polyp within the inferior right frontal sinus. Small bilateral maxillary sinus mucous retention cysts. No significant mastoid effusion. Other: Nonspecific 2.0 x 1.1 cm ovoid lesion within the right pre maxillary soft tissues demonstrating restricted diffusion, incompletely assessed (series 8, image 36). IMPRESSION: Significantly motion degraded examination. No evidence of acute  intracranial abnormality, including acute infarct. Moderate generalized parenchymal atrophy with chronic small vessel ischemic disease. Chronic lacunar infarcts within the left frontal lobe white matter and left basal ganglia. Nonspecific 2.0 cm ovoid lesion within the right pre maxillary soft tissues, incompletely assessed. Correlate clinically and consider dedicated imaging, as warranted. Moderate sized maxillary sinus mucous retention cyst versus polyp within the inferior right frontal sinus. Electronically Signed   By: Kellie Simmering   On: 05/28/2019 12:36    ASSESSMENT AND PLAN:   Active Problems:   HCAP (healthcare-associated pneumonia)  * sepsis due to Pneumonia   COnt IV Abx.   MRSA and COVID test negative.   * Ac respi failure- hypoxia   Supplemental oxygen.    * Speech difficulty   Stat CT head as he had 2 falls- negative.    Get MRI brain. As symptoms started 2-3 days ago- and not major symptoms- Speech improved now, MRI negative.  Will do Carotid doppler and echo to finish TIA work up. Pt is already on ASA and atorvastatin.  * Uncontrolled Htn   Cont home meds, Give Hydralazine IV PRN  * ESRD On HD   Nephrology to help.   Did urgent HD.  * Hyperkalemia   stable now after urgent HD.  * Active smoker  Counselled to Quit for 4 min.      All the records are reviewed and case discussed with Care Management/Social Workerr. Management plans discussed with the patient, family and they are in agreement.  CODE STATUS: full.  TOTAL TIME TAKING CARE OF  THIS PATIENT: 35 minutes.     POSSIBLE D/C IN 1-2 DAYS, DEPENDING ON CLINICAL CONDITION.   Vaughan Basta M.D on 05/29/2019   Between 7am to 6pm - Pager - [phone]  After 6pm go to [email or web address] - password [email or web address] [facility]  Office  [phone]  CC: Primary care physician; Mikey College, NP  Note: This dictation was prepared with Dragon dictation along with smaller phrase technology. Any transcriptional errors that result from this process are unintentional.

## 2019-05-30 ENCOUNTER — Encounter: Payer: Self-pay | Admitting: Nurse Practitioner

## 2019-05-30 DIAGNOSIS — I169 Hypertensive crisis, unspecified: Secondary | ICD-10-CM

## 2019-05-30 DIAGNOSIS — I208 Other forms of angina pectoris: Secondary | ICD-10-CM

## 2019-05-30 DIAGNOSIS — I480 Paroxysmal atrial fibrillation: Secondary | ICD-10-CM

## 2019-05-30 DIAGNOSIS — I25110 Atherosclerotic heart disease of native coronary artery with unstable angina pectoris: Secondary | ICD-10-CM

## 2019-05-30 DIAGNOSIS — I10 Essential (primary) hypertension: Secondary | ICD-10-CM

## 2019-05-30 LAB — CBC
HCT: 29.7 % — ABNORMAL LOW (ref 39.0–52.0)
Hemoglobin: 10.2 g/dL — ABNORMAL LOW (ref 13.0–17.0)
MCH: 34.8 pg — ABNORMAL HIGH (ref 26.0–34.0)
MCHC: 34.3 g/dL (ref 30.0–36.0)
MCV: 101.4 fL — ABNORMAL HIGH (ref 80.0–100.0)
Platelets: 191 10*3/uL (ref 150–400)
RBC: 2.93 MIL/uL — ABNORMAL LOW (ref 4.22–5.81)
RDW: 13.7 % (ref 11.5–15.5)
WBC: 9.1 10*3/uL (ref 4.0–10.5)
nRBC: 0 % (ref 0.0–0.2)

## 2019-05-30 LAB — PROTIME-INR
INR: 1.1 (ref 0.8–1.2)
Prothrombin Time: 13.7 seconds (ref 11.4–15.2)

## 2019-05-30 LAB — COMPREHENSIVE METABOLIC PANEL
ALT: 10 U/L (ref 0–44)
AST: 13 U/L — ABNORMAL LOW (ref 15–41)
Albumin: 3.5 g/dL (ref 3.5–5.0)
Alkaline Phosphatase: 115 U/L (ref 38–126)
Anion gap: 12 (ref 5–15)
BUN: 40 mg/dL — ABNORMAL HIGH (ref 6–20)
CO2: 27 mmol/L (ref 22–32)
Calcium: 9.1 mg/dL (ref 8.9–10.3)
Chloride: 96 mmol/L — ABNORMAL LOW (ref 98–111)
Creatinine, Ser: 6.98 mg/dL — ABNORMAL HIGH (ref 0.61–1.24)
GFR calc Af Amer: 9 mL/min — ABNORMAL LOW (ref >60)
GFR calc non Af Amer: 8 mL/min — ABNORMAL LOW (ref >60)
Glucose, Bld: 123 mg/dL — ABNORMAL HIGH (ref 70–99)
Potassium: 4.4 mmol/L (ref 3.5–5.1)
Sodium: 135 mmol/L (ref 135–145)
Total Bilirubin: 1 mg/dL (ref 0.3–1.2)
Total Protein: 7.2 g/dL (ref 6.5–8.1)

## 2019-05-30 LAB — GLUCOSE, CAPILLARY
Glucose-Capillary: 101 mg/dL — ABNORMAL HIGH (ref 70–99)
Glucose-Capillary: 120 mg/dL — ABNORMAL HIGH (ref 70–99)

## 2019-05-30 LAB — TSH: TSH: 0.953 u[IU]/mL (ref 0.350–4.500)

## 2019-05-30 LAB — MAGNESIUM: Magnesium: 2 mg/dL (ref 1.7–2.4)

## 2019-05-30 LAB — TROPONIN I (HIGH SENSITIVITY)
Troponin I (High Sensitivity): 29 ng/L — ABNORMAL HIGH (ref <18)
Troponin I (High Sensitivity): 33 ng/L — ABNORMAL HIGH (ref <18)

## 2019-05-30 LAB — ECHOCARDIOGRAM COMPLETE
Height: 67 in
Weight: 2480 oz

## 2019-05-30 LAB — PHOSPHORUS: Phosphorus: 3.7 mg/dL (ref 2.5–4.6)

## 2019-05-30 LAB — APTT: aPTT: 29 seconds (ref 24–36)

## 2019-05-30 LAB — HEPARIN LEVEL (UNFRACTIONATED): Heparin Unfractionated: <0.1 IU/mL — ABNORMAL LOW (ref 0.30–0.70)

## 2019-05-30 MED ORDER — DILTIAZEM HCL-DEXTROSE 100-5 MG/100ML-% IV SOLN (PREMIX)
5.0000 mg/h | INTRAVENOUS | Status: DC
  Administered 2019-05-30: 11:00:00 5 mg/h via INTRAVENOUS
  Filled 2019-05-30: qty 100

## 2019-05-30 MED ORDER — NITROGLYCERIN 0.4 MG SL SUBL
0.4000 mg | SUBLINGUAL_TABLET | SUBLINGUAL | Status: DC | PRN
  Administered 2019-05-30 (×2): 0.4 mg via SUBLINGUAL

## 2019-05-30 MED ORDER — HEPARIN (PORCINE) 25000 UT/250ML-% IV SOLN
1800.0000 [IU]/h | INTRAVENOUS | Status: DC
  Administered 2019-05-30 – 2019-05-31 (×2): 1000 [IU]/h via INTRAVENOUS
  Administered 2019-06-01: 1600 [IU]/h via INTRAVENOUS
  Administered 2019-06-02 (×2): 1800 [IU]/h via INTRAVENOUS
  Filled 2019-05-30 (×6): qty 250

## 2019-05-30 MED ORDER — NITROGLYCERIN 0.4 MG SL SUBL: 0.4000 mg | SUBLINGUAL_TABLET | SUBLINGUAL | Status: DC | PRN

## 2019-05-30 MED ORDER — HYDRALAZINE HCL 50 MG PO TABS
50.0000 mg | ORAL_TABLET | Freq: Three times a day (TID) | ORAL | Status: DC
  Administered 2019-05-30 – 2019-05-31 (×6): 50 mg via ORAL
  Filled 2019-05-30 (×6): qty 1

## 2019-05-30 MED ORDER — HEPARIN BOLUS VIA INFUSION
4000.0000 [IU] | Freq: Once | INTRAVENOUS | Status: AC
  Administered 2019-05-30: 12:00:00 4000 [IU] via INTRAVENOUS
  Filled 2019-05-30: qty 4000

## 2019-05-30 MED ORDER — NICOTINE 21 MG/24HR TD PT24
21.0000 mg | MEDICATED_PATCH | Freq: Every day | TRANSDERMAL | Status: DC
  Administered 2019-05-30 – 2019-06-04 (×6): 21 mg via TRANSDERMAL
  Filled 2019-05-30 (×6): qty 1

## 2019-05-30 NOTE — Progress Notes (Signed)
Pre HD Note:     05/30/19 0830  Vital Signs  Temp 98 F (36.7 C)  Temp Source Oral  Pulse Rate 68  Pulse Rate Source Dinamap  Resp 16  BP (!) 184/82  BP Location Right Arm  BP Method Automatic  Patient Position (if appropriate) Lying  Oxygen Therapy  SpO2 98 %  O2 Device Nasal Cannula  O2 Flow Rate (L/min) 2 L/min  Pain Assessment  Pain Scale 0-10  Pain Score 0  Dialysis Weight  Weight 77.5 kg  Type of Weight Pre-Dialysis  Time-Out for Hemodialysis  What Procedure? HD  Pt Identifiers(min of two) First/Last Name;MRN/Account#;Pt's DOB(use if MRN/Acct# not available  Correct Site? Yes  Correct Side? Yes  Correct Procedure? Yes  Consents Verified? Yes  Rad Studies Available? N/A  Safety Precautions Reviewed? Yes  Engineer, civil (consulting) Number [place] Number 2  UF/Alarm Test Passed  Conductivity: Meter 14  Conductivity: Machine  14.2  pH 7.2  Reverse Osmosis main  Normal Saline Lot Number [id number]  Dialyzer Lot Number [id number]  Disposable Set Lot Number [id number]  Machine Temperature 80 F (36.1 C)  Musician and Audible Yes  Blood Lines Intact and Secured Yes  Pre Treatment Patient Checks  Vascular access used during treatment Fistula  HD catheter dressing before treatment  (n/a)  Patient is receiving dialysis in a chair  (no)  Hepatitis B Surface Antigen Results Negative  Date Hepatitis B Surface Antigen Drawn 04/06/19  Isolation Initiated  (no)  Hepatitis B Surface Antibody  (24)  Date Hepatitis B Surface Antibody Drawn 04/06/19  Hemodialysis Consent Verified Yes  Hemodialysis Standing Orders Initiated Yes  ECG (Telemetry) Monitor On Yes  Prime Ordered Normal Saline  Length of  DialysisTreatment -hour(s) 3.5 Hour(s)  Dialysis Treatment Comments  (Na 140)  Dialyzer Elisio 17H NR  Dialysate 2K;2.5 Ca  Dialysate Flow Ordered 600  Blood Flow Rate Ordered 400 mL/min  Ultrafiltration Goal 2 Liters  Dialysis Blood Pressure Support Ordered  Normal Saline  Education / Care Plan  Dialysis Education Provided Yes  Documented Education in Care Plan Yes  Outpatient Plan of Care Reviewed and on Chart Yes  Fistula / Graft Left Forearm  Placement Date: (c) 12/14/18   Placed prior to admission: Yes  Orientation: Left  Access Location: Forearm  Site Condition No complications  Fistula / Graft Assessment Bruit;Thrill;Present  Status Accessed

## 2019-05-30 NOTE — Progress Notes (Signed)
HD Tx Initiated:    05/30/19 0843  Vital Signs  Temp 98 F (36.7 C)  Temp Source Oral  Pulse Rate 69  Pulse Rate Source Monitor  Resp 16  BP (!) 193/87  BP Location Right Arm  BP Method Automatic  Patient Position (if appropriate) Lying  Oxygen Therapy  SpO2 98 %  O2 Device Nasal Cannula  O2 Flow Rate (L/min) 2 L/min  Pain Assessment  Pain Scale 0-10  Pain Score 0  Dialysis Weight  Weight 77.5 kg  Type of Weight Pre-Dialysis  During Hemodialysis Assessment  Blood Flow Rate (mL/min) 400 mL/min  Arterial Pressure (mmHg) -110 mmHg  Venous Pressure (mmHg) 100 mmHg  Transmembrane Pressure (mmHg) 60 mmHg  Ultrafiltration Rate (mL/min) 720 mL/min  Dialysate Flow Rate (mL/min) 600 ml/min  Conductivity: Machine  14.2  HD Safety Checks Performed Yes  Dialysis Fluid Bolus Normal Saline  Bolus Amount (mL) 250 mL  Intra-Hemodialysis Comments Progressing as prescribed

## 2019-05-30 NOTE — Progress Notes (Signed)
   05/30/19 1200  Clinical Encounter Type  Visited With Health care provider  Visit Type Initial  Referral From Nurse  Consult/Referral To Chaplain  Spiritual Encounters  Spiritual Needs Other (Comment)  Chaplain was page for RR and arrived maintain a pastoral presence, patient was move to ICU. Follow up may be needed.

## 2019-05-30 NOTE — Progress Notes (Signed)
Rapid response called for no relief of chest tightness and burning. SpO2 98% RA, BP 188/11, HR 138, complains of pain and discomfort 10/10.

## 2019-05-30 NOTE — Progress Notes (Signed)
Pre HD Assessment:    05/30/19 0843  Neurological  Level of Consciousness Alert  Orientation Level Oriented X4  Respiratory  Respiratory Pattern Regular;Unlabored  Chest Assessment Chest expansion symmetrical  Bilateral Breath Sounds Diminished  Cardiac  Pulse Irregular  Heart Sounds S1, S2  ECG Monitor Yes  Cardiac Rhythm Atrial fibrillation  Vascular  R Radial Pulse +2  L Radial Pulse +2  Psychosocial  Psychosocial (WDL) WDL

## 2019-05-30 NOTE — Progress Notes (Signed)
Pt b/p cont to be elevated through out the night. Gave hydralazine prn x2. Cont to monitor.

## 2019-05-30 NOTE — Progress Notes (Signed)
Pt had chest pain episode in dialysis center. Dialysis RN called in a rapid response team to bedside. Pt was transported to ICU 12. Report given to [facility]

## 2019-05-30 NOTE — Progress Notes (Signed)
ASA 81 mg, calcium carbinate 200 mg administered for complaint of chest tightness and burning while on HD Tx.

## 2019-05-30 NOTE — Progress Notes (Signed)
HD Tx Completed: See nursing notes for details regarding early completion of HD Tx.    05/30/19 1000  Vital Signs  Temp 98 F (36.7 C)  Temp Source Oral  Pulse Rate (!) 127  Pulse Rate Source Dinamap  Resp 19  BP (!) 188/111  BP Location Right Arm  BP Method Automatic  Patient Position (if appropriate) Lying  Oxygen Therapy  SpO2 98 %  O2 Device Nasal Cannula  O2 Flow Rate (L/min) 2 L/min  Pain Assessment  Pain Scale 0-10  Pain Score 0  During Hemodialysis Assessment  Blood Flow Rate (mL/min) 400 mL/min  Arterial Pressure (mmHg) -120 mmHg  Venous Pressure (mmHg) 100 mmHg  Transmembrane Pressure (mmHg) 60 mmHg  Ultrafiltration Rate (mL/min) 720 mL/min  Dialysate Flow Rate (mL/min) 600 ml/min  Conductivity: Machine  14.2  HD Safety Checks Performed Yes  KECN 30.1 KECN  Dialysis Fluid Bolus Normal Saline  Bolus Amount (mL) 250 mL  Intra-Hemodialysis Comments Tx completed;See progress note

## 2019-05-30 NOTE — Progress Notes (Signed)
Post HD Assessment:     05/30/19 1000  Neurological  Level of Consciousness Alert  Orientation Level Oriented X4  Respiratory  Respiratory Pattern Regular;Labored  Chest Assessment Chest expansion symmetrical  Cardiac  Pulse Irregular  Heart Sounds S1, S2  ECG Monitor Yes  Cardiac Rhythm Atrial fibrillation  Vascular  R Radial Pulse +2  L Radial Pulse +2  Psychosocial  Psychosocial (WDL) WDL

## 2019-05-30 NOTE — Consult Note (Signed)
ANTICOAGULATION CONSULT NOTE - Follow Up Consult  Pharmacy Consult for Heparin Indication: atrial fibrillation  Allergies  Allergen Reactions  . Lisinopril     Patient Measurements: Height: 5\' 7"  (170.2 cm) Weight: 155 lb (70.3 kg) IBW/kg (Calculated) : 66.1 Heparin Dosing Weight: 70.3 kg  Vital Signs: Temp: 96.7 F (35.9 C) (09/16 1040) Temp Source: Axillary (09/16 1040) BP: 179/92 (09/16 1040) Pulse Rate: 151 (09/16 1040)  Labs: Recent Labs    05/28/19 0709 05/29/19 0340  HGB 11.0* 9.6*  HCT 32.3* 28.0*  PLT 211 186  CREATININE 9.38* 6.20*  TROPONINIHS 24*  --     Estimated Creatinine Clearance: 12.4 mL/min (A) (by C-G formula based on SCr of 6.2 mg/dL (H)).   Medications:  Heparin 5000 units Q8h (d/c, last dose 9/16 @0422 ) No anticoagulation prior to admission  Assessment: 58 y/o M with hx of CHF, ESRD on HD, CAD, HLD, and HTN admitted 9/14 with acute respiratory failure secondary to PNA. This morning patient complaining of chest tightness and burning while on HD and found to be in Afib with RVR. Now in the ICU on a diltiazem drip.   Goal of Therapy:  Heparin level 0.3-0.7 units/ml Monitor platelets by anticoagulation protocol: Yes   Plan:  Heparin bolus of 4000 units x1 followed by maintenance infusion rate of heparin 1000 units/hr. Will obtain anti-Xa level in 8 hours. Continue to follow daily CBC per protocol.   Benita Gutter 05/30/2019,10:51 AM

## 2019-05-30 NOTE — Progress Notes (Signed)
Post HD Note:    05/30/19 1015  Vital Signs  Temp 98 F (36.7 C)  Temp Source Oral  Pulse Rate (!) 137  Pulse Rate Source Dinamap  Resp 18  BP (!) 177/81  BP Location Right Arm  BP Method Automatic  Patient Position (if appropriate) Lying  Oxygen Therapy  SpO2 98 %  O2 Device Nasal Cannula  O2 Flow Rate (L/min) 2 L/min  Pain Assessment  Pain Scale 0-10  Pain Score 8  Post-Hemodialysis Assessment  Rinseback Volume (mL) 250 mL  KECN 30.1 V  Dialyzer Clearance Lightly streaked  Duration of HD Treatment -hour(s) 3 hour(s) (Tx ended early)  Hemodialysis Intake (mL) 500 mL  UF Total -Machine (mL) 740 mL  Net UF (mL) 240 mL  Tolerated HD Treatment No (Comment) (see nursing note for details, RRT, chest tightness and burni)  AVG/AVF Arterial Site Held (minutes)  (5)  AVG/AVF Venous Site Held (minutes)  (5)  Fistula / Graft Left Forearm  Placement Date: (c) 12/14/18   Placed prior to admission: Yes  Orientation: Left  Access Location: Forearm  Site Condition No complications  Fistula / Graft Assessment Bruit;Thrill;Present  Status Deaccessed  Drainage Description None

## 2019-05-30 NOTE — Progress Notes (Signed)
[facility] [place] NAME: Daryl Weeks    MR#:  [id number]  DATE OF BIRTH:  Mar 17, 1961  SUBJECTIVE:  CHIEF COMPLAINT:   Chief Complaint  Patient presents with  . Shortness of Breath   Laying in the bed, heart rate stabilized now.  Had earlier rapid response at dialysis needing transfer to stepdown  REVIEW OF SYSTEMS:  CONSTITUTIONAL: No fever, fatigue or weakness.  EYES: No blurred or double vision.  EARS, NOSE, AND THROAT: No tinnitus or ear pain.  RESPIRATORY: have cough, shortness of breath, no wheezing or hemoptysis.  CARDIOVASCULAR: No chest pain, orthopnea, edema.  GASTROINTESTINAL: No nausea, vomiting, diarrhea or abdominal pain.  GENITOURINARY: No dysuria, hematuria.  ENDOCRINE: No polyuria, nocturia,  HEMATOLOGY: No anemia, easy bruising or bleeding SKIN: No rash or lesion. MUSCULOSKELETAL: No joint pain or arthritis.   NEUROLOGIC: No tingling, numbness, weakness.  PSYCHIATRY: No anxiety or depression.   ROS  DRUG ALLERGIES:   Allergies  Allergen Reactions  . Lisinopril     VITALS:  Blood pressure (!) 150/75, pulse 70, temperature 97.7 F (36.5 C), temperature source Oral, resp. rate 14, height 5\' 7"  (1.702 m), weight 70.3 kg, SpO2 95 %.  PHYSICAL EXAMINATION:  GENERAL:  58 y.o.-year-old patient lying in the bed with no acute distress.  EYES: Pupils equal, round, reactive to light and accommodation. No scleral icterus. Extraocular muscles intact.  HEENT: Head atraumatic, normocephalic. Oropharynx and nasopharynx clear.  NECK:  Supple, no jugular venous distention. No thyroid enlargement, no tenderness.  LUNGS: Normal breath sounds bilaterally, no wheezing, some crepitation. No use of accessory muscles of respiration.  CARDIOVASCULAR: S1, S2 normal. No murmurs, rubs, or gallops.  ABDOMEN: Soft, nontender, nondistended. Bowel sounds present. No organomegaly or mass.  EXTREMITIES: No pedal edema, cyanosis, or clubbing.   NEUROLOGIC: Cranial nerves II through XII are intact. Muscle strength 5/5 in all extremities. Sensation intact. Gait not checked.  PSYCHIATRIC: The patient is alert and oriented x 3.  SKIN: No obvious rash, lesion, or ulcer.   Physical Exam LABORATORY PANEL:   CBC Recent Labs  Lab 05/30/19 1249  WBC 9.1  HGB 10.2*  HCT 29.7*  PLT 191   ------------------------------------------------------------------------------------------------------------------  Chemistries  Recent Labs  Lab 05/30/19 1249  NA 135  K 4.4  CL 96*  CO2 27  GLUCOSE 123*  BUN 40*  CREATININE 6.98*  CALCIUM 9.1  MG 2.0  AST 13*  ALT 10  ALKPHOS 115  BILITOT 1.0   ------------------------------------------------------------------------------------------------------------------  Cardiac Enzymes No results for input(s): TROPONINI in the last 168 hours. ------------------------------------------------------------------------------------------------------------------  RADIOLOGY:  Dg Chest 2 View  Result Date: 05/29/2019 CLINICAL DATA:  History of recent falls. EXAM: CHEST - 2 VIEW COMPARISON:  Rib series 05/28/2019. FINDINGS: Prior CABG. Stable cardiomegaly. Persistent but improved multifocal bilateral pulmonary infiltrates/edema. No pleural effusion or pneumothorax. Diffuse osteopenia. Thoracic spine difficult to evaluate due to osteopenia and overlying infiltrates. No evidence of displaced rib fracture. IMPRESSION: 1.  Prior CABG.  Stable cardiomegaly. 2. Persistent but improved multifocal bilateral pulmonary infiltrates/edema. Electronically Signed   By: [place]   On: 05/29/2019 10:02    ASSESSMENT AND PLAN:   Active Problems:   HCAP (healthcare-associated pneumonia)  * Atrial fibrillation with rapid ventricular response -new onset -Starting him on Cardizem drip -Obtain 2D echo -Transfer to stepdown, consult cardiology.  Continue metoprolol -Further evaluation per cardiology  * sepsis  due to Pneumonia   Cont IV Abx.   MRSA  and COVID test negative.   * Ac respi failure- hypoxia   Supplemental oxygen.    * Speech difficulty -could be TIA, it is resolved   Stat CT head as he had 2 falls- negative.  MRI negative. -Continue ASA and atorvastatin.  * Uncontrolled Htn   Cont home meds, Give Hydralazine IV PRN  * ESRD On HD  getting dialyzed per nephrology  * Hyperkalemia Resolved after dialysis  * Active smoker  Counselled to Quit for 4 min.  By admitting doctor      All the records are reviewed and case discussed with Care Management/Social Workerr. Management plans discussed with the patient, nursing, ICU team and they are in agreement.  CODE STATUS: full.  TOTAL TIME (critical care) TAKING CARE OF THIS PATIENT: 35 minutes.     POSSIBLE D/C IN 2-3 DAYS, DEPENDING ON CLINICAL CONDITION.   Max Sane M.D on 05/30/2019   Between 7am to 6pm - Pager - [phone]  After 6pm go to [email or web address] - password [email or web address] [facility]  Office  [phone]  CC: Primary care physician; Mikey College, NP  Note: This dictation was prepared with Dragon dictation along with smaller phrase technology. Any transcriptional errors that result from this process are unintentional.

## 2019-05-30 NOTE — Progress Notes (Signed)
Manuella Ghazi, MD notified of pt complaint of chest tightness and burning. Requested orders for Tums and nitrostat. New orders received.

## 2019-05-30 NOTE — Consult Note (Signed)
CRITICAL CARE NOTE  58 y.o. male with a known history of CHF, ESRD on HD< CAD, HLD, Htn- lives alone and had 2 falls Saturday ( 2 days ago) which he claims to be accidental and denies any dizziness, focal weakness, numbness. Since last night he felt his speech is altered and have some weakness generalized. He woke up early for his morning HD_ spoke to his sister on phone at 4;30 am and she noticed his speech is not right. He went for HD and after 30 min they terminated treatment due to Hypoxia and high BP. In ER he was noted to have Pneumonia. COVID 19 test negative. His K is high. And he is hypoxic.    Rapid response called for no relief of chest tightness and burning. SpO2 98% RA, BP 188/11, HR 138, complains of pain and discomfort 10/10.  CC  Acute chest pain  SUBJECTIVE Still with chest pain ECG shows afib Cardiology consulted Started on heparin and CCB   BP (!) 179/92 (BP Location: Right Arm)   Pulse (!) 151   Temp (!) 96.7 F (35.9 C) (Axillary)   Resp 20   Ht 5\' 7"  (1.702 m)   Wt 70.3 kg   SpO2 97%   BMI 24.28 kg/m    I/O last 3 completed shifts: In: 687.5 [P.O.:600; IV Piggyback:87.5] Out: -  No intake/output data recorded.  SpO2: 97 % O2 Flow Rate (L/min): 2 L/min    Review of Systems:  Gen:  Denies  fever, sweats, chills weight loss  HEENT: Denies blurred vision, double vision, ear pain, eye pain, hearing loss, nose bleeds, sore throat Cardiac:  +chest pain or +heaviness, +chest tightness Resp:   No cough, -sputum production, -shortness of breath,-wheezing, -hemoptysis,  Gi: Denies swallowing difficulty, stomach pain, nausea or vomiting, diarrhea, constipation, bowel incontinence Gu:  Denies bladder incontinence, burning urine Ext:   Denies Joint pain, stiffness or swelling Skin: Denies  skin rash, easy bruising or bleeding or hives Endoc:  Denies polyuria, polydipsia , polyphagia or weight change Psych:   Denies depression, insomnia or hallucinations   Other:  All other systems negative    GENERAL:critically ill appearing,  HEAD: Normocephalic, atraumatic.  EYES: Pupils equal, round, reactive to light.  No scleral icterus.  MOUTH: Moist mucosal membrane. NECK: Supple.  PULMONARY: +rhonchi,  CARDIOVASCULAR: S1 and S2. Regular rate and rhythm. No murmurs, rubs, or gallops.  GASTROINTESTINAL: Soft, nontender, -distended. No masses. Positive bowel sounds. No hepatosplenomegaly.  MUSCULOSKELETAL: No swelling, clubbing, or edema.  NEUROLOGIC: alert and awake SKIN:intact,warm,dry  MEDICATIONS: I have reviewed all medications and confirmed regimen as documented   CULTURE RESULTS   Recent Results (from the past 240 hour(s))  SARS Coronavirus 2 [facility] order, Performed in [facility] hospital lab) Nasopharyngeal Nasopharyngeal Swab     Status: None   Collection Time: 05/28/19  7:20 AM   Specimen: Nasopharyngeal Swab  Result Value Ref Range Status   SARS Coronavirus 2 NEGATIVE NEGATIVE Final    Comment: (NOTE) If result is NEGATIVE SARS-CoV-2 target nucleic acids are NOT DETECTED. The SARS-CoV-2 RNA is generally detectable in upper and lower  respiratory specimens during the acute phase of infection. The lowest  concentration of SARS-CoV-2 viral copies this assay can detect is 250  copies / mL. A negative result does not preclude SARS-CoV-2 infection  and should not be used as the sole basis for treatment or other  patient management decisions.  A negative result may occur with  improper specimen collection /  handling, submission of specimen other  than nasopharyngeal swab, presence of viral mutation(s) within the  areas targeted by this assay, and inadequate number of viral copies  (<250 copies / mL). A negative result must be combined with clinical  observations, patient history, and epidemiological information. If result is POSITIVE SARS-CoV-2 target nucleic acids are DETECTED. The SARS-CoV-2 RNA is generally detectable in  upper and lower  respiratory specimens dur ing the acute phase of infection.  Positive  results are indicative of active infection with SARS-CoV-2.  Clinical  correlation with patient history and other diagnostic information is  necessary to determine patient infection status.  Positive results do  not rule out bacterial infection or co-infection with other viruses. If result is PRESUMPTIVE POSTIVE SARS-CoV-2 nucleic acids MAY BE PRESENT.   A presumptive positive result was obtained on the submitted specimen  and confirmed on repeat testing.  While 2019 novel coronavirus  (SARS-CoV-2) nucleic acids may be present in the submitted sample  additional confirmatory testing may be necessary for epidemiological  and / or clinical management purposes  to differentiate between  SARS-CoV-2 and other Sarbecovirus currently known to infect humans.  If clinically indicated additional testing with an alternate test  methodology [phone]) is advised. The SARS-CoV-2 RNA is generally  detectable in upper and lower respiratory sp ecimens during the acute  phase of infection. The expected result is Negative. Fact Sheet for Patients:  StrictlyIdeas.no Fact Sheet for Healthcare Providers: BankingDealers.co.za This test is not yet approved or cleared by the [place] FDA and has been authorized for detection and/or diagnosis of SARS-CoV-2 by FDA under an Emergency Use Authorization (EUA).  This EUA will remain in effect (meaning this test can be used) for the duration of the COVID-19 declaration under Section 564(b)(1) of the Act, 21 U.S.C. section 360bbb-3(b)(1), unless the authorization is terminated or revoked sooner. Performed at [facility], [address] [place], [place] [address]   MRSA PCR Screening     Status: None   Collection Time: 05/28/19 10:36 AM   Specimen: Nasal Mucosa; Nasopharyngeal  Result Value Ref Range Status   MRSA  by PCR NEGATIVE NEGATIVE Final    Comment:        The GeneXpert MRSA Assay (FDA approved for NASAL specimens only), is one component of a comprehensive MRSA colonization surveillance program. It is not intended to diagnose MRSA infection nor to guide or monitor treatment for MRSA infections. Performed at [facility], [place], [place] [address]              ASSESSMENT AND PLAN SYNOPSIS  Acute chest pain Unstable angina/NSTEMI Check CE and Troponins Follow up cardiology recs     Renal Failure -follow chem 7 -follow UO -continue Foley Catheter-assess need -Avoid nephrotoxic agents    CARDIAC ICU monitoring   GI GI PROPHYLAXIS as indicated  NUTRITIONAL STATUS DIET-->as tolerated Constipation protocol as indicated  ENDO - will use ICU hypoglycemic\Hyperglycemia protocol if indicated   ELECTROLYTES -follow labs as needed -replace as needed -pharmacy consultation and following   DVT/GI PRX ordered TRANSFUSIONS AS NEEDED MONITOR FSBS ASSESS the need for LABS as needed   Critical Care Time devoted to patient care services described in this note is 35 minutes.   Overall, patient is critically ill, prognosis is guarded.      Corrin Parker, M.D.  [facility]  Medical Director [place] Director [facility] Cardio-Pulmonary Department

## 2019-05-30 NOTE — Significant Event (Signed)
Rapid Response Event Note  Overview: Page received at 1003 to respond to dialysis. Arrived to dialysis unit at 1006. Pt laying in bed, dialysis nurse putting zoll pads on patient.      Initial Focused Assessment: Pt alert, oriented.  Pt having chest pain, reports feeling of sledgehammer to chest, endorses some nausea, some SOB. Pt in a fib with RVR with rate 120-150s, 97% on 2LNC, 181/77. Pt's skin warm, appropriate for ethnicity, clammy. Pt reports having open heart surgery approximately 5 years ago. Prior to rapid response team arrival, HD RN had given pt 325mg  of ASA.     Interventions: EKG ordered, pt given two doses of SL nitro with little effect on pain, Dr. Manuella Ghazi called and order received for cardizem drip and transfer to stepdown for closer monitoring.   Plan of Care (if not transferred): Pt taken to stepdown unit.   Event Summary:   at      at          [facility]

## 2019-05-30 NOTE — Consult Note (Addendum)
Cardiology Consult    Patient ID: Daryl Weeks MRN: [id number], DOB/AGE: 1960-12-18   Admit date: 05/28/2019 Date of Consult: 05/30/2019  Primary Physician: Daryl College, Daryl Weeks Primary Cardiologist: Daryl [place], Daryl Weeks Requesting Provider: V. Manuella Ghazi, Daryl Weeks  Patient Profile    Daryl Weeks is a 58 y.o. male with a history of coronary artery disease status post four-vessel bypass in 2015, end-stage renal disease on dialysis 4 days a week, hypertension, hyperlipidemia, HFpEF (EF 50 to 55% 05/2019), anemia of chronic disease, anxiety, arthritis, sleep apnea, and ongoing tobacco and alcohol abuse, who is being seen today for the evaluation of atrial fibrillation with rapid ventricular response at the request of Dr. Manuella Weeks.  Past Medical History   Past Medical History:  Diagnosis Date   (HFpEF) heart failure with preserved ejection fraction ([place])    a. 05/2019 Echo: EF 50-55%, diast dysfxn, RVSP 56.39mmHg, Sev dil LA. Mildly dil PA.   Anemia    Anxiety    Arthritis    Colon polyps    COPD (chronic obstructive pulmonary disease) (HCC)    Coronary artery disease    a. 10/2013 NSTEMI/Cath: Severe 3VD-->CABG x 4 @ [facility] 01/2014 (LIMA->LAD, VG->OM1->OM2, VG->RCA); b. 11/2014 MV: No isch/infarct.   ESRD (end stage renal disease) ([facility])    a. MWFSat HD   ETOH abuse    a. 2 beers/night.   GERD (gastroesophageal reflux disease)    History of pneumonia    Hyperlipidemia    Hypertension    IgA nephropathy    Lower GI bleed    a. Due to colon polyps. Status post resection of 14 polyps   Non-ST elevation MI (NSTEMI) (HCC)    Rheumatoid arthritis (HCC)    Shortness of breath    Sleep apnea    Tobacco abuse     Past Surgical History:  Procedure Laterality Date   A/V FISTULAGRAM Left 10/28/2016   Procedure: A/V Fistulagram;  Surgeon: [name], Daryl Weeks;  Location: [place] [facility];  Service: Cardiovascular;  Laterality: Left;   A/V FISTULAGRAM Left 12/07/2018   Procedure:  A/V FISTULAGRAM;  Surgeon: [name], Daryl Weeks;  Location: [place] [facility];  Service: Cardiovascular;  Laterality: Left;   A/V SHUNT INTERVENTION N/A 10/28/2016   Procedure: A/V Shunt Intervention;  Surgeon: [name], Daryl Weeks;  Location: [place] [facility];  Service: Cardiovascular;  Laterality: N/A;   A/V SHUNTOGRAM Left 02/01/2019   Procedure: A/V SHUNTOGRAM;  Surgeon: [name], Daryl Weeks;  Location: [place] [facility];  Service: Cardiovascular;  Laterality: Left;   AV FISTULA PLACEMENT     CARDIAC CATHETERIZATION     RCA 90% and calcified mid LAD 80% Stenosis   CORONARY ARTERY BYPASS GRAFT N/A 11/29/2013   Procedure: CORONARY ARTERY BYPASS GRAFTING (CABG) x 4 using endoscopically harvested right saphenous vein and left internal mammary artery;  Surgeon: [name], Daryl Weeks;  Location: [place];  Service: Open Heart Surgery;  Laterality: N/A;   dialysis catheterr  2/15   DIALYSIS/PERMA CATHETER REMOVAL N/A 02/15/2019   Procedure: DIALYSIS/PERMA CATHETER REMOVAL;  Surgeon: [name], Daryl Weeks;  Location: [place] [facility];  Service: Cardiovascular;  Laterality: N/A;   INSERTION OF DIALYSIS CATHETER N/A 12/14/2018   Procedure: INSERTION OF DIALYSIS CATHETER ( PERMCATH );  Surgeon: [name], Daryl Weeks;  Location: [facility];  Service: Vascular;  Laterality: N/A;   INTRAOPERATIVE TRANSESOPHAGEAL ECHOCARDIOGRAM N/A 11/29/2013   Procedure: INTRAOPERATIVE TRANSESOPHAGEAL ECHOCARDIOGRAM;  Surgeon: [name],  Daryl Weeks;  Location: [facility];  Service: Open Heart Surgery;  Laterality: N/A;   PERIPHERAL VASCULAR CATHETERIZATION N/A 06/12/2015   Procedure: A/V Shuntogram/Fistulagram;  Surgeon: [name], Daryl Weeks;  Location: [place] [facility];  Service: Cardiovascular;  Laterality: N/A;   PERIPHERAL VASCULAR CATHETERIZATION Left 06/12/2015   Procedure: A/V Shunt Intervention;  Surgeon: [name], Daryl Weeks;  Location: [place] [facility];  Service: Cardiovascular;  Laterality: Left;   RENAL BIOPSY Left 14   REVISON OF  ARTERIOVENOUS FISTULA Left 12/14/2018   Procedure: REVISON OF ARTERIOVENOUS FISTULA;  Surgeon: [name], Daryl Weeks;  Location: [facility];  Service: Vascular;  Laterality: Left;     Allergies  Allergies  Allergen Reactions   Lisinopril     History of Present Illness    58 year old male with above complex past medical history including coronary artery disease, end-stage renal disease, hypertension, hyperlipidemia, HFpEF, anemia of chronic disease, anxiety, arthritis, sleep apnea, and ongoing tobacco and alcohol abuse.  In 2015, he suffered a non-STEMI with catheterization revealing severe multivessel disease.  Echo showed normal LV function at that time.  He was subsequently referred to CT surgery and underwent CABG x4 in May 2015.  His last ischemic evaluation was in 2016, as he was being evaluated at [facility] for renal transplant.  Stress testing was nonischemic.  He was last seen in cardiology clinic in October 2018 has been lost to follow-up since then.  He lives locally by himself.  He continues to smoke a half a pack a day and drink at least 2 beers every evening.  He has some degree of chronic dyspnea on exertion and also intermittent chest discomfort.  Since Saturday, September 12, he has been feeling off and unsteady.  He fell twice that day without any significant trauma.  He says that throughout the night between September 13 and 14, he was experiencing significant dyspnea and intermittent chest discomfort which caused him to pace the floor all night.  On the morning of September 14, he felt as though his speech was slurred.  He presented for dialysis that morning and he was noted to be hypertensive and hypoxic.  He was referred to the emergency department where his blood pressure was 199/95.  He was initially found to be hypoxic.  He was also hyperkalemic.  CT of the head was negative for any acute findings.  Chest x-ray was concerning for lower lobe infiltrates versus edema and he was placed on  antibiotics due to concern for sepsis and pneumonia.  In, MRI was performed and did not show any acute infarcts though chronic lacunar infarcts within the left frontal lobe white matter and left basal ganglia were noted.  This morning, he was at dialysis and complained of chest pain.  He was found to be tachycardic and in atrial fibrillation with a rapid ventricular response.  Rapid response was called and he was subsequently transferred to the intensive care unit.  There, heart rates were in the 130s to 150s and he was placed on IV diltiazem as well as heparin.  He continued to complain of chest discomfort though was otherwise hemodynamically stable.  High-sensitivity troponin has been recycled and is mildly elevated at 29  33.  Inpatient Medications     amLODipine  10 mg Oral Daily   aspirin EC  81 mg Oral Daily   atorvastatin  40 mg Oral q1800   budesonide  0.5 mg Nebulization Q12H   calcium carbonate  1 tablet Oral BID WC  Chlorhexidine Gluconate Cloth  6 each Topical Q0600   cinacalcet  30 mg Oral Q supper   cloNIDine  0.2 mg Oral BID   epoetin (EPOGEN/PROCRIT) injection  4,000 Units Intravenous Q M,W,F-HD   furosemide  80 mg Oral BID   gabapentin  100 mg Oral TID   hydrALAZINE  50 mg Oral TID   influenza vac split quadrivalent PF  0.5 mL Intramuscular Tomorrow-1000   irbesartan  300 mg Oral QHS   loratadine  10 mg Oral Daily   metoprolol tartrate  100 mg Oral BID   multivitamin  1 tablet Oral Daily   sevelamer carbonate  2,400 mg Oral Daily   tiotropium  18 mcg Inhalation QPM   zolpidem  10 mg Oral QHS    Family History    Family History  Problem Relation Age of Onset   Heart disease Father    Heart disease Brother    Healthy Sister    Stroke Neg Hx    He indicated that his mother is deceased. He indicated that his father is deceased. He indicated that his sister is alive. He indicated that his brother is alive. He indicated that his son is alive.  He indicated that the status of his neg hx is unknown.   Social History    Social History   Socioeconomic History   Marital status: Single    Spouse name: Not on file   Number of children: Not on file   Years of education: Not on file   Highest education level: 8th grade  Occupational History   Not on file  Social Needs   Financial resource strain: Not hard at all   Food insecurity    Worry: Never true    Inability: Never true   Transportation needs    Medical: No    Non-medical: No  Tobacco Use   Smoking status: Current Every Day Smoker    Packs/day: 0.50    Years: 32.00    Pack years: 16.00    Types: Cigarettes   Smokeless tobacco: Never Used   Tobacco comment: daily  Substance and Sexual Activity   Alcohol use: Yes    Alcohol/week: 14.0 standard drinks    Types: 14 Cans of beer per week    Comment: 2 beers/night   Drug use: No   Sexual activity: Not on file  Lifestyle   Physical activity    Days per week: 0 days    Minutes per session: 0 min   Stress: Not at all  Relationships   Social connections    Talks on phone: More than three times a week    Gets together: More than three times a week    Attends religious service: Never    Active member of club or organization: No    Attends meetings of clubs or organizations: Never    Relationship status: Divorced   Intimate partner violence    Fear of current or ex partner: No    Emotionally abused: No    Physically abused: No    Forced sexual activity: No  Other Topics Concern   Not on file  Social History Narrative   Lives locally by himself.  Does not routinely exercise.     Review of Systems    General:  No chills, fever, night sweats or weight changes.  Cardiovascular:  +++ chest pain, +++ dyspnea on exertion, no edema, orthopnea, +++ palpitations, no paroxysmal nocturnal dyspnea. Dermatological: No rash, lesions/masses Respiratory: No cough, +++  dyspnea Urologic: No hematuria,  dysuria Abdominal:   No nausea, vomiting, diarrhea, bright red blood per rectum, melena, or hematemesis Neurologic:  No visual changes, +++ wkns falls prior to admission, no changes in mental status.  +++ Recent slurring of speech though this has resolved. All other systems reviewed and are otherwise negative except as noted above.  Physical Exam    Blood pressure (!) 163/89, pulse 80, temperature 97.7 F (36.5 C), temperature source Oral, resp. rate 14, height 5\' 7"  (1.702 m), weight 70.3 kg, SpO2 97 %.  General: Pleasant, NAD Psych: Normal affect. Neuro: Alert and oriented X 3. Moves all extremities spontaneously. HEENT: Normal  Neck: Supple without bruits or JVD. Lungs:  Resp regular and unlabored, bibasilar crackles Heart: Irregularly irregular, tachycardic, no s3, s4, or murmurs. Abdomen: Soft, non-tender, non-distended, BS + x 4.  Extremities: No clubbing, cyanosis or edema. DP/PT/Radials 1+ and equal bilaterally.  Labs    Cardiac Enzymes Recent Labs  Lab 05/28/19 0709 05/30/19 1119 05/30/19 1249  TROPONINIHS 24* 29* 33*      Lab Results  Component Value Date   WBC 9.1 05/30/2019   HGB 10.2 (L) 05/30/2019   HCT 29.7 (L) 05/30/2019   MCV 101.4 (H) 05/30/2019   PLT 191 05/30/2019    Recent Labs  Lab 05/30/19 1249  NA 135  K 4.4  CL 96*  CO2 27  BUN 40*  CREATININE 6.98*  CALCIUM 9.1  PROT 7.2  BILITOT 1.0  ALKPHOS 115  ALT 10  AST 13*  GLUCOSE 123*   Lab Results  Component Value Date   CHOL 137 05/29/2019   HDL 72 05/29/2019   LDLCALC 54 05/29/2019   TRIG 55 05/29/2019     Radiology Studies    Dg Chest 2 View  Result Date: 05/29/2019 CLINICAL DATA:  History of recent falls. EXAM: CHEST - 2 VIEW COMPARISON:  Rib series 05/28/2019. FINDINGS: Prior CABG. Stable cardiomegaly. Persistent but improved multifocal bilateral pulmonary infiltrates/edema. No pleural effusion or pneumothorax. Diffuse osteopenia. Thoracic spine difficult to evaluate due to  osteopenia and overlying infiltrates. No evidence of displaced rib fracture. IMPRESSION: 1.  Prior CABG.  Stable cardiomegaly. 2. Persistent but improved multifocal bilateral pulmonary infiltrates/edema. Electronically Signed   By: Marcello Moores  Register   On: 05/29/2019 10:02   Dg Ribs Unilateral W/chest Left  Result Date: 05/28/2019 CLINICAL DATA:  Difficulty breathing.  Left rib pain. EXAM: LEFT RIBS AND CHEST - 3+ VIEW COMPARISON:  06/17/2016 FINDINGS: Grossly unchanged enlarged cardiac silhouette and mediastinal contours post median sternotomy and CABG. The lungs appear hyperexpanded with flattening the bilaterally diaphragms. Interval development of ill-defined heterogeneous airspace opacities within the right mid and bilateral lower lungs. Blunting the bilateral costophrenic angles without definite pleural effusion. No pneumothorax. No definite evidence of edema. No definite displaced left-sided rib fractures with special attention paid to the area demarcated by the radiopaque BB. Regional soft tissues appear normal. IMPRESSION: 1. Findings worrisome for multifocal infection, right greater than left, including atypical etiologies. A follow-up chest radiograph in 3 to 4 weeks after treatment is recommended to ensure resolution. 2. No definite displaced left-sided rib fractures special attention paid to the area demarcated by the radiopaque BB. Electronically Signed   By: Sandi Mariscal M.D.   On: 05/28/2019 08:06   Ct Head Wo Contrast  Result Date: 05/28/2019 CLINICAL DATA:  Dysarthria.  Recent fall EXAM: CT HEAD WITHOUT CONTRAST TECHNIQUE: Contiguous axial images were obtained from the base of the skull through the vertex  without intravenous contrast. COMPARISON:  None. FINDINGS: Brain: There is mild diffuse atrophy. There is no intracranial mass, hemorrhage, extra-axial fluid collection, or midline shift. There is patchy small vessel disease in the centra semiovale bilaterally. There are tiny lacunar type  infarcts in the left lentiform nucleus. No acute appearing infarct is evident. Vascular: There is no hyperdense vessel. There is calcification in the carotid siphon regions. Skull: There are foci of sclerosis in the calvarium as well as scattered small lucencies. This salt and pepper appearance is indicative of hyperparathyroidism and is likely secondary to the known chronic end-stage renal disease. No evident fracture. Calcification along the falx is a benign etiology and has no clinical significance. Sinuses/Orbits: There is a retention cyst in the right frontal sinus. There is opacification of a posterior ethmoid air cell on the right. Orbits appear symmetric bilaterally except for evidence of previous cataract removal on the right. Other: Mastoid air cells are clear. IMPRESSION: Atrophy with periventricular small vessel disease. Tiny lacunar infarcts in the left lentiform nucleus. No acute infarct. No mass or hemorrhage. There are foci of arterial vascular calcification. There are bony changes consistent with secondary hyperparathyroidism, likely due to known chronic end-stage renal disease. There are foci of paranasal sinus disease. Electronically Signed   By: Lowella Grip III M.D.   On: 05/28/2019 10:16   Mr Brain Wo Contrast  Result Date: 05/28/2019 CLINICAL DATA:  Speech difficulty, recent fall, dysarthria EXAM: MRI HEAD WITHOUT CONTRAST TECHNIQUE: Multiplanar, multiecho pulse sequences of the brain and surrounding structures were obtained without intravenous contrast. COMPARISON:  Head CT 05/28/2019 FINDINGS: Brain: Multiple sequences are significantly motion degraded, limiting evaluation. The sagittal acquired T1 weighted imaging is severely motion degraded. No evidence of acute infarct. No evidence of intracranial mass. No midline shift. No extra-axial fluid collection identified, although significant motion degradation of the acquired axial FLAIR sequence at the level of the vertex limits  evaluation. Small chronic hemorrhagic lacunar infarct within the left frontal lobe white matter. Additional small chronic hemorrhagic lacunar infarct within the left basal ganglia. An additional punctate focus of chronic microhemorrhage is questioned within the left cerebellum. Mild scattered and confluent T2/FLAIR hyperintensity within the cerebral white matter is nonspecific, but consistent with chronic small vessel ischemic disease. Moderate generalized parenchymal atrophy. Vascular: Flow voids maintained within the proximal large arterial vessels. Skull and upper cervical spine: Within limitations of motion degradation, normal marrow signal. Sinuses/Orbits: Moderate-sized mucous retention cyst versus polyp within the inferior right frontal sinus. Small bilateral maxillary sinus mucous retention cysts. No significant mastoid effusion. Other: Nonspecific 2.0 x 1.1 cm ovoid lesion within the right pre maxillary soft tissues demonstrating restricted diffusion, incompletely assessed (series 8, image 36). IMPRESSION: Significantly motion degraded examination. No evidence of acute intracranial abnormality, including acute infarct. Moderate generalized parenchymal atrophy with chronic small vessel ischemic disease. Chronic lacunar infarcts within the left frontal lobe white matter and left basal ganglia. Nonspecific 2.0 cm ovoid lesion within the right pre maxillary soft tissues, incompletely assessed. Correlate clinically and consider dedicated imaging, as warranted. Moderate sized maxillary sinus mucous retention cyst versus polyp within the inferior right frontal sinus. Electronically Signed   By: Kellie Simmering   On: 05/28/2019 12:36    ECG & Cardiac Imaging    Atrial fibrillation, 136, LVH with repolarization abnormalities.  No acute ST/T changes - personally reviewed.  Assessment & Plan    1.  Atrial fibrillation with rapid ventricular response: Patient does not carry a prior diagnosis of paroxysmal atrial  fibrillation though he reports that he has had intermittent palpitations associated with chest pain, most recently occurring prior to his admission on Sunday night/Monday morning.  He was in dialysis today when he developed recurrent chest pain, tachycardia, and was found to be in rapid A. fib.  We have placed him on IV diltiazem and heparin in hopes of rate control and conversion though it is notable that his left atrium was severely dilated on echocardiogram yesterday.  May need to consider cardioversion tomorrow if he remains in A. fib and or is not rate controlled.  CHA2DS2VASc equals 5 in the setting of prior history of CHF, hypertension, lacunar infarcts noted on MRI, and CAD.  We will plan to transition to [place] once it is clear that he will not require invasive evaluation.  He is already on maximal dose of metoprolol at home-100 mg twice daily.  If he converts on diltiazem, can add a long-acting p.o. dose in addition to beta-blocker therapy provided that he does not experience bradycardia.  2.  Pneumonia: Antibiotic therapy per internal medicine.  3.  Coronary artery disease/unstable angina/elevated troponin: Patient had acute onset of chest pain in the setting of rapid atrial fibrillation earlier this morning.  Troponin is minimally elevated at 33 currently.  He notes that he has been experiencing intermittent exertional chest pain and dyspnea at home as well.  It is been 2 years since his last office follow-up with [place] and 4 years since his last stress test.  Echocardiogram yesterday showed low-normal LV function.  We can consider outpatient stress testing following full recovery post hospitalization.  Continue aspirin, statin, beta-blocker  4.  HFpEF: Patient with dyspnea and crackles after dialysis was unable to be completed this morning.  Volume management per nephrology.  Heart rate and blood pressure both elevated in the setting of A. fib.  Rate control with IV diltiazem for the time being in  addition to home dose of beta-blocker therapy.  Otherwise continue ARB and hydralazine.  5.  Essential hypertension: Poorly controlled.  As above, continue beta-blocker, ARB, hydralazine, and calcium channel blocker.  Of note, he is on amlodipine at home and we will likely discontinue this if he continues to require diltiazem therapy going forward.  Would have a low threshold to convert metoprolol to carvedilol for improved blood pressure management.  6.  Hyperlipidemia: Continue statin therapy.  LDL was 54 yesterday.  LFTs within normal limits.  7.  Tobacco abuse: Still smoking half a pack a day.  Cessation advised.  8.  Alcohol abuse: Currently drinking 2 beers per night.  Cessation advised, especially now in the setting of A. Fib.  9.  End-stage renal disease: Monday, Wednesday, Friday, Saturday outpatient hemodialysis.  Per nephrology.  Signed, Daryl Hodgkins, Daryl Weeks 05/30/2019, 2:22 PM  For questions or updates, please contact   Please consult [email or web address] for contact info under Cardiology/STEMI.  Addendum: Attending Note Patient seen and examined, agree with detailed note above,   Patient presentation and plan discussed on rounds.   Cardiology consult placed by Dr. Manuella Weeks EKG lab work, chest x-ray, echocardiogram reviewed independently by myself  Briefly, Patient has a complex medical history of CAD/CABG 4 vessels, chronic kidney disease on hemodialysis, heart failure preserved ejection fraction, who assented to the hospital due to being short of breath, wobbly on his feet, slurred speech.  2 days ago on day of admission he was noted to be hypertensive and also hypoxic while on his dialysis treatment, this prompted him to be transferred  to the emergency room.  On admission head CT did not show any acute pathology, x-ray did show bilateral lung infiltrates.  He was being managed with antibiotics for possible infection.  While getting dialyzed today, he noted some chest discomfort and  known to be in tachycardia.  Rapid response was called and further evaluation showed patient was in atrial fibrillation with rapid ventricular response.  He was then transferred to the ICU for further management.  Patient was started on diltiazem drip and heparin infusion for stroke prophylaxis.  This was the first known episode of patient being in atrial fibrillation.  On physical exam, patient appeared tired, but was alert and oriented.  Heart exam revealed tachycardia, lung exam did show crackles at bases.  For atrial fibrillation we will continue with the diltiazem drip for rate control, will start patient home dose of beta-blocker 100 mg Lopressor twice daily.  Continue heparin drip for now for stroke prophylaxis with plan to convert to an oral agent prior to discharge.  Due to his significant cardiac history, will get troponin levels in light of his complaints of chest pain.  His chest pain was likely secondary to the irregular heart rhythm and not an ischemic event.    Signed: Kate Sable, M.D. [facility]

## 2019-05-30 NOTE — Progress Notes (Signed)
[facility], [place] 05/30/19  Subjective:   LOS: 2 09/15 0701 - 09/16 0700 In: 447.5 [P.O.:360; IV Piggyback:87.5] Out: -  Presents for SOB, severe HTN, urgent hemodialysis  With 3 L remived Says he sweated all night Still has cough with sputum Feels weak Appetite is fair   HEMODIALYSIS FLOWSHEET:  Blood Flow Rate (mL/min): 200 mL/min Arterial Pressure (mmHg): -40 mmHg Venous Pressure (mmHg): 90 mmHg Transmembrane Pressure (mmHg): 60 mmHg Ultrafiltration Rate (mL/min): 0 mL/min Dialysate Flow Rate (mL/min): 600 ml/min Conductivity: Machine : 14 Conductivity: Machine : 14 Dialysis Fluid Bolus: Normal Saline Bolus Amount (mL): 250 mL     Objective:  Vital signs in last 24 hours:  Temp:  [97.5 F (36.4 C)-98.4 F (36.9 C)] 98.4 F (36.9 C) (09/16 0415) Pulse Rate:  [61-84] 68 (09/16 0527) Resp:  [18-20] 20 (09/16 0415) BP: (160-206)/(71-94) 160/71 (09/16 0527) SpO2:  [96 %-99 %] 98 % (09/16 0730)  Weight change:  Filed Weights   05/28/19 0703  Weight: 70.3 kg    Intake/Output:    Intake/Output Summary (Last 24 hours) at 05/30/2019 0828 Last data filed at 05/30/2019 0455 Gross per 24 hour  Intake 447.47 ml  Output -  Net 447.47 ml    Gen:   Alert, cooperative, no distress, appears older than stated age Head:   Normocephalic, without obvious abnormality, atraumatic Eyes/ENT:  conjunctiva/corneas clear,  moist oral mucus membranes Neck:  Supple,  thyroid: not enlarged, no JVD Lungs:   Clear amt and laterally, [place] O2 Heart:   Regular rhythm, no, rub or gallop Abdomen:   Soft, non-tender,   Extremities: no cyanosis or edema Neurologic: Alert and oriented, able to answer questions appropriately Left arm AVF    Basic Metabolic Panel:  Recent Labs  Lab 05/28/19 0709 05/28/19 1452 05/29/19 0340  NA 131*  --  136  K 6.2*  --  5.1  CL 93*  --  96*  CO2 21*  --  28  GLUCOSE 117*  --  121*  BUN 61*  --  37*  CREATININE  9.38*  --  6.20*  CALCIUM 9.3  --  9.3  PHOS  --  3.9  --      CBC: Recent Labs  Lab 05/28/19 0709 05/29/19 0340  WBC 11.5* 8.4  NEUTROABS 9.7*  --   HGB 11.0* 9.6*  HCT 32.3* 28.0*  MCV 102.5* 101.1*  PLT 211 186      Lab Results  Component Value Date   HEPBSAG NEGATIVE 11/30/2013      Microbiology:  Recent Results (from the past 240 hour(s))  SARS Coronavirus 2 [facility] order, Performed in [facility] hospital lab) Nasopharyngeal Nasopharyngeal Swab     Status: None   Collection Time: 05/28/19  7:20 AM   Specimen: Nasopharyngeal Swab  Result Value Ref Range Status   SARS Coronavirus 2 NEGATIVE NEGATIVE Final    Comment: (NOTE) If result is NEGATIVE SARS-CoV-2 target nucleic acids are NOT DETECTED. The SARS-CoV-2 RNA is generally detectable in upper and lower  respiratory specimens during the acute phase of infection. The lowest  concentration of SARS-CoV-2 viral copies this assay can detect is 250  copies / mL. A negative result does not preclude SARS-CoV-2 infection  and should not be used as the sole basis for treatment or other  patient management decisions.  A negative result may occur with  improper specimen collection / handling, submission of specimen other  than nasopharyngeal swab, presence of viral mutation(s)  within the  areas targeted by this assay, and inadequate number of viral copies  (<250 copies / mL). A negative result must be combined with clinical  observations, patient history, and epidemiological information. If result is POSITIVE SARS-CoV-2 target nucleic acids are DETECTED. The SARS-CoV-2 RNA is generally detectable in upper and lower  respiratory specimens dur ing the acute phase of infection.  Positive  results are indicative of active infection with SARS-CoV-2.  Clinical  correlation with patient history and other diagnostic information is  necessary to determine patient infection status.  Positive results do  not rule out  bacterial infection or co-infection with other viruses. If result is PRESUMPTIVE POSTIVE SARS-CoV-2 nucleic acids MAY BE PRESENT.   A presumptive positive result was obtained on the submitted specimen  and confirmed on repeat testing.  While 2019 novel coronavirus  (SARS-CoV-2) nucleic acids may be present in the submitted sample  additional confirmatory testing may be necessary for epidemiological  and / or clinical management purposes  to differentiate between  SARS-CoV-2 and other Sarbecovirus currently known to infect humans.  If clinically indicated additional testing with an alternate test  methodology [phone]) is advised. The SARS-CoV-2 RNA is generally  detectable in upper and lower respiratory sp ecimens during the acute  phase of infection. The expected result is Negative. Fact Sheet for Patients:  StrictlyIdeas.no Fact Sheet for Healthcare Providers: BankingDealers.co.za This test is not yet approved or cleared by the [place] FDA and has been authorized for detection and/or diagnosis of SARS-CoV-2 by FDA under an Emergency Use Authorization (EUA).  This EUA will remain in effect (meaning this test can be used) for the duration of the COVID-19 declaration under Section 564(b)(1) of the Act, 21 U.S.C. section 360bbb-3(b)(1), unless the authorization is terminated or revoked sooner. Performed at [facility], [address] [place], [place] [address]   MRSA PCR Screening     Status: None   Collection Time: 05/28/19 10:36 AM   Specimen: Nasal Mucosa; Nasopharyngeal  Result Value Ref Range Status   MRSA by PCR NEGATIVE NEGATIVE Final    Comment:        The GeneXpert MRSA Assay (FDA approved for NASAL specimens only), is one component of a comprehensive MRSA colonization surveillance program. It is not intended to diagnose MRSA infection nor to guide or monitor treatment for MRSA infections. Performed at  [facility], [address] [place],  [address]     Coagulation Studies: No results for input(s): LABPROT, INR in the last 72 hours.  Urinalysis: No results for input(s): COLORURINE, LABSPEC, PHURINE, GLUCOSEU, HGBUR, BILIRUBINUR, KETONESUR, PROTEINUR, UROBILINOGEN, NITRITE, LEUKOCYTESUR in the last 72 hours.  Invalid input(s): APPERANCEUR    Imaging: Dg Chest 2 View  Result Date: 05/29/2019 CLINICAL DATA:  History of recent falls. EXAM: CHEST - 2 VIEW COMPARISON:  Rib series 05/28/2019. FINDINGS: Prior CABG. Stable cardiomegaly. Persistent but improved multifocal bilateral pulmonary infiltrates/edema. No pleural effusion or pneumothorax. Diffuse osteopenia. Thoracic spine difficult to evaluate due to osteopenia and overlying infiltrates. No evidence of displaced rib fracture. IMPRESSION: 1.  Prior CABG.  Stable cardiomegaly. 2. Persistent but improved multifocal bilateral pulmonary infiltrates/edema. Electronically Signed   By: Marcello Moores  Register   On: 05/29/2019 10:02   Ct Head Wo Contrast  Result Date: 05/28/2019 CLINICAL DATA:  Dysarthria.  Recent fall EXAM: CT HEAD WITHOUT CONTRAST TECHNIQUE: Contiguous axial images were obtained from the base of the skull through the vertex without intravenous contrast. COMPARISON:  None. FINDINGS: Brain:  There is mild diffuse atrophy. There is no intracranial mass, hemorrhage, extra-axial fluid collection, or midline shift. There is patchy small vessel disease in the centra semiovale bilaterally. There are tiny lacunar type infarcts in the left lentiform nucleus. No acute appearing infarct is evident. Vascular: There is no hyperdense vessel. There is calcification in the carotid siphon regions. Skull: There are foci of sclerosis in the calvarium as well as scattered small lucencies. This salt and pepper appearance is indicative of hyperparathyroidism and is likely secondary to the known chronic end-stage renal disease. No evident fracture.  Calcification along the falx is a benign etiology and has no clinical significance. Sinuses/Orbits: There is a retention cyst in the right frontal sinus. There is opacification of a posterior ethmoid air cell on the right. Orbits appear symmetric bilaterally except for evidence of previous cataract removal on the right. Other: Mastoid air cells are clear. IMPRESSION: Atrophy with periventricular small vessel disease. Tiny lacunar infarcts in the left lentiform nucleus. No acute infarct. No mass or hemorrhage. There are foci of arterial vascular calcification. There are bony changes consistent with secondary hyperparathyroidism, likely due to known chronic end-stage renal disease. There are foci of paranasal sinus disease. Electronically Signed   By: Lowella Grip III M.D.   On: 05/28/2019 10:16   Mr Brain Wo Contrast  Result Date: 05/28/2019 CLINICAL DATA:  Speech difficulty, recent fall, dysarthria EXAM: MRI HEAD WITHOUT CONTRAST TECHNIQUE: Multiplanar, multiecho pulse sequences of the brain and surrounding structures were obtained without intravenous contrast. COMPARISON:  Head CT 05/28/2019 FINDINGS: Brain: Multiple sequences are significantly motion degraded, limiting evaluation. The sagittal acquired T1 weighted imaging is severely motion degraded. No evidence of acute infarct. No evidence of intracranial mass. No midline shift. No extra-axial fluid collection identified, although significant motion degradation of the acquired axial FLAIR sequence at the level of the vertex limits evaluation. Small chronic hemorrhagic lacunar infarct within the left frontal lobe white matter. Additional small chronic hemorrhagic lacunar infarct within the left basal ganglia. An additional punctate focus of chronic microhemorrhage is questioned within the left cerebellum. Mild scattered and confluent T2/FLAIR hyperintensity within the cerebral white matter is nonspecific, but consistent with chronic small vessel ischemic  disease. Moderate generalized parenchymal atrophy. Vascular: Flow voids maintained within the proximal large arterial vessels. Skull and upper cervical spine: Within limitations of motion degradation, normal marrow signal. Sinuses/Orbits: Moderate-sized mucous retention cyst versus polyp within the inferior right frontal sinus. Small bilateral maxillary sinus mucous retention cysts. No significant mastoid effusion. Other: Nonspecific 2.0 x 1.1 cm ovoid lesion within the right pre maxillary soft tissues demonstrating restricted diffusion, incompletely assessed (series 8, image 36). IMPRESSION: Significantly motion degraded examination. No evidence of acute intracranial abnormality, including acute infarct. Moderate generalized parenchymal atrophy with chronic small vessel ischemic disease. Chronic lacunar infarcts within the left frontal lobe white matter and left basal ganglia. Nonspecific 2.0 cm ovoid lesion within the right pre maxillary soft tissues, incompletely assessed. Correlate clinically and consider dedicated imaging, as warranted. Moderate sized maxillary sinus mucous retention cyst versus polyp within the inferior right frontal sinus. Electronically Signed   By: Kellie Simmering   On: 05/28/2019 12:36     Medications:   . ceFEPime (MAXIPIME) IV Stopped (05/29/19 1732)  . vancomycin     . amLODipine  10 mg Oral Daily  . aspirin EC  81 mg Oral Daily  . atorvastatin  40 mg Oral q1800  . budesonide  0.5 mg Nebulization Q12H  . calcium carbonate  1 tablet Oral BID WC  . Chlorhexidine Gluconate Cloth  6 each Topical Q0600  . cinacalcet  30 mg Oral Q supper  . cloNIDine  0.2 mg Oral BID  . epoetin (EPOGEN/PROCRIT) injection  4,000 Units Intravenous Q M,W,F-HD  . furosemide  80 mg Oral BID  . gabapentin  100 mg Oral TID  . heparin  5,000 Units Subcutaneous Q8H  . influenza vac split quadrivalent PF  0.5 mL Intramuscular Tomorrow-1000  . irbesartan  300 mg Oral QHS  . loratadine  10 mg Oral  Daily  . metoprolol tartrate  100 mg Oral BID  . multivitamin  1 tablet Oral Daily  . sevelamer carbonate  2,400 mg Oral Daily  . tiotropium  18 mcg Inhalation QPM  . zolpidem  10 mg Oral QHS   acetaminophen, albuterol, docusate sodium, hydrALAZINE  Assessment/ Plan:  58 y.o. male with  ESRD, CAD/ CABG 2015, CHF, sleep apnea, alcohol abuse, current smoker, history of chronic lacunar infarcts within left frontal lobe white matter and left basal ganglia  [facility] MWF-1// TW 70kg   Active Problems:   HCAP (healthcare-associated pneumonia)   #. ESRD with hyperkalemia Recent Labs    05/28/19 0709 05/29/19 0340  CREATININE 9.38* 6.20*  Schedule HD on M-W-F schedule Seen during HD Tolerating well   #. Anemia of CKD  Lab Results  Component Value Date   HGB 9.6 (L) 05/29/2019  low dose EPO with HD   #. SHPTH  No results found for: PTH Lab Results  Component Value Date   PHOS 3.9 05/28/2019  follow low phos diet  #. HTN  Isolated systolic Continue home medications Added hydralazine 50 TID today  # Pneumonia - Antibiotics as per Primary team - COVID-19 test neg    LOS: 2 Daryl Weeks Daryl Weeks 9/16/20208:28 [place], [place]

## 2019-05-31 ENCOUNTER — Inpatient Hospital Stay: Payer: Medicare Other

## 2019-05-31 DIAGNOSIS — R7989 Other specified abnormal findings of blood chemistry: Secondary | ICD-10-CM

## 2019-05-31 DIAGNOSIS — I4891 Unspecified atrial fibrillation: Secondary | ICD-10-CM

## 2019-05-31 DIAGNOSIS — N186 End stage renal disease: Secondary | ICD-10-CM

## 2019-05-31 DIAGNOSIS — Z992 Dependence on renal dialysis: Secondary | ICD-10-CM

## 2019-05-31 DIAGNOSIS — I25118 Atherosclerotic heart disease of native coronary artery with other forms of angina pectoris: Secondary | ICD-10-CM

## 2019-05-31 DIAGNOSIS — R0600 Dyspnea, unspecified: Secondary | ICD-10-CM

## 2019-05-31 LAB — BASIC METABOLIC PANEL
Anion gap: 13 (ref 5–15)
BUN: 51 mg/dL — ABNORMAL HIGH (ref 6–20)
CO2: 24 mmol/L (ref 22–32)
Calcium: 8.5 mg/dL — ABNORMAL LOW (ref 8.9–10.3)
Chloride: 96 mmol/L — ABNORMAL LOW (ref 98–111)
Creatinine, Ser: 8.5 mg/dL — ABNORMAL HIGH (ref 0.61–1.24)
GFR calc Af Amer: 7 mL/min — ABNORMAL LOW (ref >60)
GFR calc non Af Amer: 6 mL/min — ABNORMAL LOW (ref >60)
Glucose, Bld: 102 mg/dL — ABNORMAL HIGH (ref 70–99)
Potassium: 4.7 mmol/L (ref 3.5–5.1)
Sodium: 133 mmol/L — ABNORMAL LOW (ref 135–145)

## 2019-05-31 LAB — HEPARIN LEVEL (UNFRACTIONATED): Heparin Unfractionated: 0.11 IU/mL — ABNORMAL LOW (ref 0.30–0.70)

## 2019-05-31 LAB — CBC WITH DIFFERENTIAL/PLATELET
Abs Immature Granulocytes: 0.03 10*3/uL (ref 0.00–0.07)
Basophils Absolute: 0.1 10*3/uL (ref 0.0–0.1)
Basophils Relative: 1 %
Eosinophils Absolute: 0.7 10*3/uL — ABNORMAL HIGH (ref 0.0–0.5)
Eosinophils Relative: 8 %
HCT: 28 % — ABNORMAL LOW (ref 39.0–52.0)
Hemoglobin: 9.9 g/dL — ABNORMAL LOW (ref 13.0–17.0)
Immature Granulocytes: 0 %
Lymphocytes Relative: 17 %
Lymphs Abs: 1.5 10*3/uL (ref 0.7–4.0)
MCH: 34.9 pg — ABNORMAL HIGH (ref 26.0–34.0)
MCHC: 35.4 g/dL (ref 30.0–36.0)
MCV: 98.6 fL (ref 80.0–100.0)
Monocytes Absolute: 1.2 10*3/uL — ABNORMAL HIGH (ref 0.1–1.0)
Monocytes Relative: 13 %
Neutro Abs: 5.7 10*3/uL (ref 1.7–7.7)
Neutrophils Relative %: 61 %
Platelets: 173 10*3/uL (ref 150–400)
RBC: 2.84 MIL/uL — ABNORMAL LOW (ref 4.22–5.81)
RDW: 13.7 % (ref 11.5–15.5)
WBC: 9.2 10*3/uL (ref 4.0–10.5)
nRBC: 0 % (ref 0.0–0.2)

## 2019-05-31 LAB — MAGNESIUM: Magnesium: 2 mg/dL (ref 1.7–2.4)

## 2019-05-31 LAB — TROPONIN I (HIGH SENSITIVITY): Troponin I (High Sensitivity): 42 ng/L — ABNORMAL HIGH (ref <18)

## 2019-05-31 LAB — PHOSPHORUS: Phosphorus: 4 mg/dL (ref 2.5–4.6)

## 2019-05-31 MED ORDER — SODIUM CHLORIDE 0.9 % IV SOLN: INTRAVENOUS | Status: DC | PRN

## 2019-05-31 MED ORDER — HEPARIN BOLUS VIA INFUSION
2100.0000 [IU] | Freq: Once | INTRAVENOUS | Status: AC
  Administered 2019-05-31: 20:00:00 2100 [IU] via INTRAVENOUS
  Filled 2019-05-31: qty 2100

## 2019-05-31 MED ORDER — CARVEDILOL 25 MG PO TABS
25.0000 mg | ORAL_TABLET | Freq: Two times a day (BID) | ORAL | Status: DC
  Administered 2019-05-31 – 2019-06-05 (×9): 25 mg via ORAL
  Filled 2019-05-31 (×2): qty 1
  Filled 2019-05-31: qty 2
  Filled 2019-05-31 (×4): qty 1
  Filled 2019-05-31 (×2): qty 2
  Filled 2019-05-31: qty 1

## 2019-05-31 MED ORDER — AMIODARONE HCL 200 MG PO TABS
400.0000 mg | ORAL_TABLET | Freq: Two times a day (BID) | ORAL | Status: DC
  Administered 2019-05-31 – 2019-06-05 (×10): 400 mg via ORAL
  Filled 2019-05-31 (×10): qty 2

## 2019-05-31 MED ORDER — DIPHENHYDRAMINE HCL 12.5 MG/5ML PO ELIX
12.5000 mg | ORAL_SOLUTION | Freq: Every evening | ORAL | Status: DC | PRN
  Administered 2019-05-31 – 2019-06-01 (×3): 12.5 mg via ORAL
  Filled 2019-05-31 (×4): qty 5

## 2019-05-31 MED ORDER — METOPROLOL TARTRATE 5 MG/5ML IV SOLN
2.5000 mg | Freq: Four times a day (QID) | INTRAVENOUS | Status: DC | PRN
  Administered 2019-05-31: 5 mg via INTRAVENOUS
  Filled 2019-05-31: qty 5

## 2019-05-31 MED ORDER — HEPARIN BOLUS VIA INFUSION
2000.0000 [IU] | Freq: Once | INTRAVENOUS | Status: AC
  Administered 2019-05-31: 09:00:00 2000 [IU] via INTRAVENOUS
  Filled 2019-05-31: qty 2000

## 2019-05-31 NOTE — Progress Notes (Signed)
[facility] [place] NAME: Daryl Weeks    MR#:  [id number]  DATE OF BIRTH:  04-23-1961  SUBJECTIVE:  CHIEF COMPLAINT:   Chief Complaint  Patient presents with  . Shortness of Breath  Heart rate little bit better controlled than yesterday, denies any new complaint REVIEW OF SYSTEMS:  CONSTITUTIONAL: No fever, fatigue or weakness.  EYES: No blurred or double vision.  EARS, NOSE, AND THROAT: No tinnitus or ear pain.  RESPIRATORY: have cough, shortness of breath, no wheezing or hemoptysis.  CARDIOVASCULAR: No chest pain, orthopnea, edema.  GASTROINTESTINAL: No nausea, vomiting, diarrhea or abdominal pain.  GENITOURINARY: No dysuria, hematuria.  ENDOCRINE: No polyuria, nocturia,  HEMATOLOGY: No anemia, easy bruising or bleeding SKIN: No rash or lesion. MUSCULOSKELETAL: No joint pain or arthritis.   NEUROLOGIC: No tingling, numbness, weakness.  PSYCHIATRY: No anxiety or depression.   ROS  DRUG ALLERGIES:   Allergies  Allergen Reactions  . Lisinopril     VITALS:  Blood pressure (!) 175/111, pulse 78, temperature 99.1 F (37.3 C), resp. rate 16, height 5\' 7"  (1.702 m), weight 77.5 kg, SpO2 94 %.  PHYSICAL EXAMINATION:  GENERAL:  58 y.o.-year-old patient lying in the bed with no acute distress.  EYES: Pupils equal, round, reactive to light and accommodation. No scleral icterus. Extraocular muscles intact.  HEENT: Head atraumatic, normocephalic. Oropharynx and nasopharynx clear.  NECK:  Supple, no jugular venous distention. No thyroid enlargement, no tenderness.  LUNGS: Normal breath sounds bilaterally, no wheezing, some crepitation. No use of accessory muscles of respiration.  CARDIOVASCULAR: S1, S2 normal. No murmurs, rubs, or gallops.  ABDOMEN: Soft, nontender, nondistended. Bowel sounds present. No organomegaly or mass.  EXTREMITIES: No pedal edema, cyanosis, or clubbing.  NEUROLOGIC: Cranial nerves II through XII are intact. Muscle  strength 5/5 in all extremities. Sensation intact. Gait not checked.  PSYCHIATRIC: The patient is alert and oriented x 3.  SKIN: No obvious rash, lesion, or ulcer.   Physical Exam LABORATORY PANEL:   CBC Recent Labs  Lab 05/31/19 0446  WBC 9.2  HGB 9.9*  HCT 28.0*  PLT 173   ------------------------------------------------------------------------------------------------------------------  Chemistries  Recent Labs  Lab 05/30/19 1249 05/31/19 0446  NA 135 133*  K 4.4 4.7  CL 96* 96*  CO2 27 24  GLUCOSE 123* 102*  BUN 40* 51*  CREATININE 6.98* 8.50*  CALCIUM 9.1 8.5*  MG 2.0 2.0  AST 13*  --   ALT 10  --   ALKPHOS 115  --   BILITOT 1.0  --    ------------------------------------------------------------------------------------------------------------------  Cardiac Enzymes No results for input(s): TROPONINI in the last 168 hours. ------------------------------------------------------------------------------------------------------------------  RADIOLOGY:  No results found.  ASSESSMENT AND PLAN:   Active Problems:   HCAP (healthcare-associated pneumonia)  * Atrial fibrillation with rapid ventricular response -new onset -Cardiology started amiodarone today -2D echo shows EF of 50 to 55% - Continue Coreg for rate control -Further evaluation per cardiology  * sepsis due to Pneumonia   Cont IV Abx.   MRSA and COVID test negative.   * Ac respi failure- hypoxia   Supplemental oxygen.    * Speech difficulty -could be TIA, it is resolved   Stat CT head as he had 2 falls- negative.  MRI negative. -Continue ASA and atorvastatin.  * Uncontrolled Htn   Cont home meds, Give Hydralazine IV PRN  * ESRD On HD  getting dialyzed per nephrology.  Monday Wednesday Friday  * Hyperkalemia Resolved after  dialysis  * Active smoker  Counselled to Quit for 4 min.  By admitting doctor      All the records are reviewed and case discussed with Care  Management/Social Workerr. Management plans discussed with the patient, nursing, ICU team and they are in agreement.  CODE STATUS: full.  TOTAL TIME TAKING CARE OF THIS PATIENT: 15 minutes.     POSSIBLE D/C IN 2-3 DAYS, DEPENDING ON CLINICAL CONDITION.   Max Sane M.D on 05/31/2019   Between 7am to 6pm - Pager - [phone]  After 6pm go to [email or web address] - password [email or web address] [facility]  Office  [phone]  CC: Primary care physician; Mikey College, NP  Note: This dictation was prepared with Dragon dictation along with smaller phrase technology. Any transcriptional errors that result from this process are unintentional.

## 2019-05-31 NOTE — Plan of Care (Signed)
  Problem: Clinical Measurements: Goal: Respiratory complications will improve Outcome: Progressing Goal: Cardiovascular complication will be avoided Outcome: Progressing   Problem: Activity: Goal: Risk for activity intolerance will decrease Outcome: Progressing   Problem: Nutrition: Goal: Adequate nutrition will be maintained Outcome: Progressing   Problem: Elimination: Goal: Will not experience complications related to bowel motility Outcome: Progressing   Problem: Safety: Goal: Ability to remain free from injury will improve Outcome: Progressing   Problem: Skin Integrity: Goal: Risk for impaired skin integrity will decrease Outcome: Progressing   Problem: Elimination: Goal: Will not experience complications related to urinary retention Outcome: Not Applicable Note: Pt is a chronic hemodialysis patient, no urine output for shift although patient is oliguric

## 2019-05-31 NOTE — Progress Notes (Signed)
Shift summary:  - U/S of carotids completed today.  - Plan for NM cardiac scan tomorrow. - H/D tomorrow.

## 2019-05-31 NOTE — Consult Note (Signed)
ANTICOAGULATION CONSULT NOTE - Follow Up Consult  Pharmacy Consult for Heparin Indication: atrial fibrillation  Allergies  Allergen Reactions  . Lisinopril     Patient Measurements: Height: 5\' 7"  (170.2 cm) Weight: 170 lb 13.7 oz (77.5 kg) IBW/kg (Calculated) : 66.1 Heparin Dosing Weight: 70.3 kg  Vital Signs: Temp: 97.5 F (36.4 C) (09/17 1552) Temp Source: Axillary (09/17 1552) BP: 162/80 (09/17 1600) Pulse Rate: 105 (09/17 1800)  Labs: Recent Labs    05/29/19 0340 05/30/19 1119 05/30/19 1249 05/30/19 1933 05/31/19 0446 05/31/19 0853 05/31/19 1756  HGB 9.6*  --  10.2*  --  9.9*  --   --   HCT 28.0*  --  29.7*  --  28.0*  --   --   PLT 186  --  191  --  173  --   --   APTT  --  29  --   --   --   --   --   LABPROT  --  13.7  --   --   --   --   --   INR  --  1.1  --   --   --   --   --   HEPARINUNFRC  --   --   --  <0.10*  --   --  0.11*  CREATININE 6.20*  --  6.98*  --  8.50*  --   --   TROPONINIHS  --  29* 33*  --   --  42*  --     Estimated Creatinine Clearance: 9.1 mL/min (A) (by C-G formula based on SCr of 8.5 mg/dL (H)).   Medications:  Heparin 5000 units Q8h (d/c, last dose 9/16 @0422 ) No anticoagulation prior to admission  Assessment: 58 y/o M with hx of CHF, ESRD on HD, CAD, HLD, and HTN admitted 9/14 with acute respiratory failure secondary to PNA. This morning patient complaining of chest tightness and burning while on HD and found to be in Afib with RVR. Now in ICU on rate control with carvedilol and rhythm control with amiodarone. Per cardiology plan is to transition to a apixaban once it is clear that no intervention is required.  [id number] @ 1756 HL 0.11, subtherapeutic. Confirmed with nurse, that heparin was temporarily interrupted for lab draw (for ~10-15 minutes). Likely will not cause heparin to be subtherapeutic. However, heparin level could have been impacted by obtaining a delayed level.    Goal of Therapy:  Heparin level 0.3-0.7  units/ml Monitor platelets by anticoagulation protocol: Yes   Plan:  Heparin bolus of 2100 units x1 and increase maintenance infusion rate of heparin to 1400 units/hr. Will obtain anti-Xa level in 6 hours, s/p dose change. Continue to follow daily CBC per protocol.   [facility] Resident 05/31/2019,7:20 PM

## 2019-05-31 NOTE — TOC Initial Note (Signed)
Transition of Care [facility]) - Initial/Assessment Note    Patient Details  Name: Daryl Weeks MRN: [id number] Date of Birth: 1961-02-01  Transition of [facility]) CM/SW Contact:    Candie Chroman, LCSW Phone Number: 05/31/2019, 1:35 PM  Clinical Narrative:  Readmission prevention screen complete. CSW met with patient. No supports at bedside. CSW introduced role and explained that discharge planning would be discussed. Patient had no home health/DME prior to admission. His PCP is Cassell Smiles, NP. Pharmacy is [facility] in [place]. He has no issues affording medications. Patient drives himself. No further concerns. CSW encouraged patient to contact CSW as needed. CSW will continue to follow patient for support and facilitate return home when stable.   Expected Discharge Plan: Home/Self Care Barriers to Discharge: Continued Medical Work up   Patient Goals and CMS Choice     Choice offered to / list presented to : NA  Expected Discharge Plan and Services Expected Discharge Plan: Home/Self Care     Post Acute Care Choice: NA Living arrangements for the past 2 months: Single Family Home                                      Prior Living Arrangements/Services Living arrangements for the past 2 months: Single Family Home Lives with:: Self Patient language and need for interpreter reviewed:: Yes Do you feel safe going back to the place where you live?: Yes      Need for Family Participation in Patient Care: Yes (Comment) Care giver support system in place?: Yes (comment)(Sister available prn.)   Criminal Activity/Legal Involvement Pertinent to Current Situation/Hospitalization: No - Comment as needed  Activities of Daily Living Home Assistive Devices/Equipment: None ADL Screening (condition at time of admission) Patient's cognitive ability adequate to safely complete daily activities?: Yes Is the patient deaf or have difficulty hearing?: No Does the patient have difficulty  seeing, even when wearing glasses/contacts?: No Does the patient have difficulty concentrating, remembering, or making decisions?: No Patient able to express need for assistance with ADLs?: Yes Does the patient have difficulty dressing or bathing?: No Independently performs ADLs?: Yes (appropriate for developmental age) Does the patient have difficulty walking or climbing stairs?: No Weakness of Legs: None Weakness of Arms/Hands: None  Permission Sought/Granted                  Emotional Assessment Appearance:: Appears stated age Attitude/Demeanor/Rapport: Engaged, Gracious Affect (typically observed): Accepting, Appropriate, Calm, Flat Orientation: : Oriented to Self, Oriented to Place, Oriented to  Time, Oriented to Situation Alcohol / Substance Use: Tobacco Use Psych Involvement: No (comment)  Admission diagnosis:  End stage renal disease on dialysis ([place]) [N18.6, Z99.2] HCAP (healthcare-associated pneumonia) [J18.9] Dyspnea, unspecified type [R06.00] Patient Active Problem List   Diagnosis Date Noted  . HCAP (healthcare-associated pneumonia) 05/28/2019  . Complication of vascular access for dialysis 11/28/2018  . COPD (chronic obstructive pulmonary disease) ([place]) 12/05/2017  . ESRD on dialysis ([place]) 10/05/2016  . IgA nephropathy 11/05/2014  . S/P CABG x 4 11/29/2013  . Chronic diastolic heart failure ([place]) 11/16/2013  . Coronary artery disease   . Hyperlipidemia   . Essential hypertension   . Dyspnea 11/20/2012  . Tobacco abuse    PCP:  Mikey College, NP Pharmacy:   [place], [place] [address] Phone: [phone]  Fax: [place], [address] [place] [address] Phone: [phone] Fax: [phone]     Social Determinants of Health (SDOH) Interventions    Readmission  Risk Interventions Readmission Risk Prevention Plan 05/31/2019  Transportation Screening Complete  HRI or Home Care Consult Complete  Social Work Consult for [place] Planning/Counseling Complete  Palliative Care Screening Not Applicable  Medication Review Press photographer) Complete  Some recent data might be hidden

## 2019-05-31 NOTE — Progress Notes (Signed)
Pharmacy Antibiotic Note  Daryl Weeks is a 58 y.o. male admitted on 05/28/2019 with pneumonia, suspected to be healthcare-associated due to patient on dialysis.  History significant for CHF, ESRD on HD, CAD, hyperlipidemia, hypertension. Patient's HD scheduled for MWF. Previously given vancomycin, Zosyn, and levofloxacin for sepsis on admission, but has since been narrowed down to cefepime for HCAP. Pharmacy has been consulted for cefepime dosing.   Today is Day 4 of therapy, and leukocytosis has resolved. Patient is afebrile.  Plan: Continue cefepime 1g Q24H (renally adjusted).  Height: 5\' 7"  (170.2 cm) Weight: 170 lb 13.7 oz (77.5 kg) IBW/kg (Calculated) : 66.1  Temp (24hrs), Avg:98.8 F (37.1 C), Min:98.5 F (36.9 C), Max:99.1 F (37.3 C)  Recent Labs  Lab 05/28/19 0709 05/29/19 0340 05/30/19 1249 05/31/19 0446  WBC 11.5* 8.4 9.1 9.2  CREATININE 9.38* 6.20* 6.98* 8.50*    Estimated Creatinine Clearance: 9.1 mL/min (A) (by C-G formula based on SCr of 8.5 mg/dL (H)).    Allergies  Allergen Reactions  . Lisinopril     Antimicrobials this admission: Levofloxacin 09/14 x 1 Zosyn 09/14 x 1 Vancomycin 09/14 x 1 Cefepime 09/15 >>   Dose adjustments this admission: n/a  Microbiology results: 09/14 MRSA PCR: negative  Thank you for allowing pharmacy to be a part of this patient's care.  Alisha Burgo Dear Nicholes Mango 05/31/2019 1:13 PM

## 2019-05-31 NOTE — Progress Notes (Signed)
Progress Note  Patient Name: Daryl Weeks Date of Encounter: 05/31/2019  Primary Cardiologist: Kathlyn Sacramento, MD  Subjective   HRs improved this am.  Off of IV dilt - trending 80's to 90's.  Noted c/p last night while shifting in bed.  He thinks elevated HR was associated w/ this.  Inpatient Medications    Scheduled Meds:  amiodarone  400 mg Oral BID   amLODipine  10 mg Oral Daily   aspirin EC  81 mg Oral Daily   atorvastatin  40 mg Oral q1800   budesonide  0.5 mg Nebulization Q12H   calcium carbonate  1 tablet Oral BID WC   carvedilol  25 mg Oral BID WC   Chlorhexidine Gluconate Cloth  6 each Topical Q0600   cinacalcet  30 mg Oral Q supper   cloNIDine  0.2 mg Oral BID   epoetin (EPOGEN/PROCRIT) injection  4,000 Units Intravenous Q M,W,F-HD   furosemide  80 mg Oral BID   gabapentin  100 mg Oral TID   hydrALAZINE  50 mg Oral TID   influenza vac split quadrivalent PF  0.5 mL Intramuscular Tomorrow-1000   irbesartan  300 mg Oral QHS   loratadine  10 mg Oral Daily   multivitamin  1 tablet Oral Daily   nicotine  21 mg Transdermal Daily   sevelamer carbonate  2,400 mg Oral Daily   tiotropium  18 mcg Inhalation QPM   zolpidem  10 mg Oral QHS   Continuous Infusions:  ceFEPime (MAXIPIME) IV Stopped (05/30/19 1922)   heparin 1,200 Units/hr (05/31/19 0911)   PRN Meds: acetaminophen, albuterol, diphenhydrAMINE, docusate sodium, hydrALAZINE, nitroGLYCERIN, nitroGLYCERIN   Vital Signs    Vitals:   05/31/19 0400 05/31/19 0500 05/31/19 0600 05/31/19 0700  BP:  (!) 157/86 (!) 178/102 (!) 175/111  Pulse: 91 96 81 78  Resp: 18 20 (!) 5 16  Temp:      TempSrc:      SpO2: 94% 93% 93% 94%  Weight:      Height:        Intake/Output Summary (Last 24 hours) at 05/31/2019 1140 Last data filed at 05/31/2019 0900 Gross per 24 hour  Intake 723.13 ml  Output --  Net 723.13 ml   Filed Weights   05/28/19 0703 05/30/19 0830 05/30/19 0843  Weight: 70.3 kg  77.5 kg 77.5 kg    Physical Exam   GEN: Well nourished, well developed, in no acute distress.  HEENT: Grossly normal.  Neck: Supple, mod elev jvp, no carotid bruits, or masses. Cardiac: IR, IR, no murmurs, rubs, or gallops. No clubbing, cyanosis, edema.  Radials/DP/PT 1+ and equal bilaterally.  Respiratory:  Respirations regular and unlabored, markedly diminished breath sounds bilat. GI: Soft, nontender, nondistended, BS + x 4. MS: no deformity or atrophy. Skin: warm and dry, no rash. Neuro:  Strength and sensation are intact. Psych: AAOx3.  Normal affect.  Labs    Chemistry Recent Labs  Lab 05/29/19 0340 05/30/19 1249 05/31/19 0446  NA 136 135 133*  K 5.1 4.4 4.7  CL 96* 96* 96*  CO2 28 27 24   GLUCOSE 121* 123* 102*  BUN 37* 40* 51*  CREATININE 6.20* 6.98* 8.50*  CALCIUM 9.3 9.1 8.5*  PROT  --  7.2  --   ALBUMIN  --  3.5  --   AST  --  13*  --   ALT  --  10  --   ALKPHOS  --  115  --  BILITOT  --  1.0  --   GFRNONAA 9* 8* 6*  GFRAA 11* 9* 7*  ANIONGAP 12 12 13      Hematology Recent Labs  Lab 05/29/19 0340 05/30/19 1249 05/31/19 0446  WBC 8.4 9.1 9.2  RBC 2.77* 2.93* 2.84*  HGB 9.6* 10.2* 9.9*  HCT 28.0* 29.7* 28.0*  MCV 101.1* 101.4* 98.6  MCH 34.7* 34.8* 34.9*  MCHC 34.3 34.3 35.4  RDW 13.6 13.7 13.7  PLT 186 191 173    Cardiac Enzymes  Recent Labs  Lab 05/28/19 0709 05/30/19 1119 05/30/19 1249 05/31/19 0853  TROPONINIHS 24* 29* 33* 42*      BNP Recent Labs  Lab 05/28/19 0709  BNP >4,500.0*     Radiology    No results found.  Telemetry    Afib, mostly 80's to 90's - Personally Reviewed  Cardiac Studies   2D Echocardiogram 9.15.2020  1. The left ventricle has low normal systolic function, with an ejection fraction of 50-55%. The cavity size was normal. There is moderately increased left ventricular wall thickness. Left ventricular diastolic Doppler parameters are consistent with  pseudonormalization. Elevated mean left atrial  pressure No evidence of left ventricular regional wall motion abnormalities.  2. The right ventricle has moderately reduced systolic function. The cavity was mildly enlarged. There is no increase in right ventricular wall thickness. Right ventricular systolic pressure is moderately elevated with an estimated pressure of 56.7  mmHg.  3. Left atrial size was severely dilated.  4. The aortic valve is tricuspid.  5. The aorta is normal unless otherwise noted.  6. Mildly dilated pulmonary artery.  7. The inferior vena cava was dilated in size with <50% respiratory variability.  8. The interatrial septum was not well visualized.   Patient Profile     58 y.o. male with a history of coronary artery disease status post four-vessel bypass in 2015, end-stage renal disease on dialysis 4 days a week, hypertension, hyperlipidemia, HFpEF (EF 50 to 55% 05/2019), anemia of chronic disease, anxiety, arthritis, sleep apnea, and ongoing tobacco and alcohol abuse, who was admitted 9/14 w/ HTN, hypoxia, intermittent c/p, and slurred speech, and developed afib rvr 9/16 during HD.  Assessment & Plan    1.  Atrial fibrillation with rapid ventricular response: Patient reports a recent history of intermittent palpitations associated with chest pain, and most recently occurring just prior to his admission.  On September 16, he was in dialysis and developed recurrent chest pain tachycardia and was found to be in rapid A. fib.  He was able to be well rate controlled with IV diltiazem and this was subsequently discontinued and he is currently on oral beta-blocker-100 mg twice daily.  He remains in A. fib with rates in the 80s to 90s though rates elevate and become associated with chest pain when shifting in bed.  He remains anticoagulated with heparin and we will plan to transition to Eliquis once it is clear that he will not require invasive evaluation (CHA2DS2VASc equals 5).  Adding amiodarone today as his multiple comorbidities  and severely dilated left atrium on echo make it likely that he will have recurrent issues with A. fib in the future. Hopefully he will convert on amiodarone however if he does not, assuming rate control is achieved, we can consider outpatient cardioversion in a few weeks.  2.  Pneumonia: Antibiotic therapy per internal medicine.  3.  Coronary artery disease/unstable angina/elevated troponin: He has significant chest discomfort earlier this week and then again yesterday in  the setting of atrial fibrillation.  Troponin here is minimally elevated at 24  29  33  42 (this AM).  Echocardiogram on the 15th showed low normal LV function.  We will plan on Lexiscan Myoview in the a.m. to rule out high risk ischemia.  Continue aspirin, statin, and beta-blocker.  4.  HFpEF: Heart rates stable however blood pressures remain elevated.  Switching metoprolol to carvedilol for better blood pressure control.  He otherwise remains on ARB, hydralazine, clonidine, and amlodipine.  Defer volume management to nephrology.  5.  Essential hypertension: As above, switching metoprolol to carvedilol.  Otherwise continue current regimen.  6.  Hyperlipidemia: LDL 54 earlier this admission with normal LFTs.  Continue statin therapy.  7.  Tobacco abuse: Still smoking half a pack a day.  Cessation advised.  8.  Alcohol abuse: Currently drinking 2 beers per night.  Cessation advised, especially now in the setting of A. Fib.  9.  End-stage renal disease: Monday, Wednesday, Friday, Saturday outpatient hemodialysis.  Session was cut short yesterday due to A. fib.  Per nephrology.  Signed, Murray Hodgkins, NP  05/31/2019, 11:40 AM    For questions or updates, please contact   Please consult [email or web address] for contact info under Cardiology/STEMI.

## 2019-05-31 NOTE — Consult Note (Signed)
ANTICOAGULATION CONSULT NOTE - Follow Up Consult  Pharmacy Consult for Heparin Indication: atrial fibrillation  Allergies  Allergen Reactions  . Lisinopril     Patient Measurements: Height: 5\' 7"  (170.2 cm) Weight: 170 lb 13.7 oz (77.5 kg) IBW/kg (Calculated) : 66.1 Heparin Dosing Weight: 70.3 kg  Vital Signs: Temp: 99.1 F (37.3 C) (09/17 0200) BP: 175/111 (09/17 0700) Pulse Rate: 78 (09/17 0700)  Labs: Recent Labs    05/29/19 0340 05/30/19 1119 05/30/19 1249 05/30/19 1933 05/31/19 0446  HGB 9.6*  --  10.2*  --  9.9*  HCT 28.0*  --  29.7*  --  28.0*  PLT 186  --  191  --  173  APTT  --  29  --   --   --   LABPROT  --  13.7  --   --   --   INR  --  1.1  --   --   --   HEPARINUNFRC  --   --   --  <0.10*  --   CREATININE 6.20*  --  6.98*  --  8.50*  TROPONINIHS  --  29* 33*  --   --     Estimated Creatinine Clearance: 9.1 mL/min (A) (by C-G formula based on SCr of 8.5 mg/dL (H)).   Medications:  Heparin 5000 units Q8h (d/c, last dose 9/16 @0422 ) No anticoagulation prior to admission  Assessment: 58 y/o M with hx of CHF, ESRD on HD, CAD, HLD, and HTN admitted 9/14 with acute respiratory failure secondary to PNA. This morning patient complaining of chest tightness and burning while on HD and found to be in Afib with RVR. Now in ICU on rate control with carvedilol and rhythm control with amiodarone. Per cardiology plan is to transition to a apixaban once it is clear that no intervention is required.  [id number] @ 1933 HL <0.10, subtherapeutic. No action taken after heparin level yesterday.   Goal of Therapy:  Heparin level 0.3-0.7 units/ml Monitor platelets by anticoagulation protocol: Yes   Plan:  Heparin bolus of 2000 units x1 and increase maintenance infusion rate of heparin to 1200 units/hr. Will obtain anti-Xa level in 8 hours. Continue to follow daily CBC per protocol.   [place] Resident 05/31/2019,8:57 AM

## 2019-05-31 NOTE — Progress Notes (Signed)
PHARMACY CONSULT NOTE - FOLLOW UP  Pharmacy Consult for Electrolyte Monitoring and Replacement   Recent Labs: Potassium (mmol/L)  Date Value  05/31/2019 4.7  07/11/2014 4.8   Magnesium (mg/dL)  Date Value  05/31/2019 2.0   Calcium (mg/dL)  Date Value  05/31/2019 8.5 (L)   Calcium, Total (mg/dL)  Date Value  06/24/2014 8.8   Albumin (g/dL)  Date Value  05/30/2019 3.5  06/24/2014 3.0 (L)   Phosphorus (mg/dL)  Date Value  05/31/2019 4.0  01/14/2014 2.6   Sodium (mmol/L)  Date Value  05/31/2019 133 (L)  06/24/2014 132 (L)    Pharmacy consulted to assist in monitoring and replacing electrolytes in this 58 YO male admitted on 05/28/2019 with HCAP. History significant for CHF, ESRD on HD (scheduled MWF), CAD, hyperlipidemia, and hypertension. Patient was hyperkalemic (6.2) on admission.   Assessment: Currently on Lasix 80 mg PO BID and irbesartan 300 mg PO QHS. Patient has HD scheduled for tomorrow. Potassium level decreasing.  Goal of Therapy:  K ~ 4, Mg ~ 2 in the setting of atrial fibrillation.  Plan:  No replacement warranted at this time. Monitor BMP with AM labs.  Pharmacy will monitor and adjust per consult.  Geovanie Winnett Dear Nicholes Mango, pharmacy student 05/31/2019 1:39 PM

## 2019-05-31 NOTE — Progress Notes (Signed)
[facility], [place] 05/31/19  Subjective:   LOS: 3 09/16 0701 - 09/17 0700 In: 603.1 [P.O.:235; I.V.:278.1; IV Piggyback:90] Out: 240    Patient had tachycardia and chest pain at dialysis yesterday leading to rapid response Dx with A Fib with RVR Feels well this morning Denies SOB Currently on room air  Objective:  Vital signs in last 24 hours:  Temp:  [96.7 F (35.9 C)-99.1 F (37.3 C)] 99.1 F (37.3 C) (09/17 0200) Pulse Rate:  [62-151] 78 (09/17 0700) Resp:  [5-20] 16 (09/17 0700) BP: (132-187)/(75-111) 175/111 (09/17 0700) SpO2:  [90 %-98 %] 94 % (09/17 0700)  Weight change:  Filed Weights   05/28/19 0703 05/30/19 0830 05/30/19 0843  Weight: 70.3 kg 77.5 kg 77.5 kg    Intake/Output:    Intake/Output Summary (Last 24 hours) at 05/31/2019 1007 Last data filed at 05/31/2019 0700 Gross per 24 hour  Intake 603.13 ml  Output 240 ml  Net 363.13 ml    Gen:   Alert, cooperative, no distress, appears older than stated age Head:   Normocephalic, without obvious abnormality, atraumatic Eyes/ENT:  conjunctiva/corneas clear,  moist oral mucus membranes Neck:  Supple,  thyroid: not enlarged, no JVD Lungs:   Clear to auscultation today, room air Heart:   Regular rhythm, no, rub or gallop Abdomen:   Soft, non-tender,   Extremities: no cyanosis or edema Neurologic: Alert and oriented, able to answer questions appropriately Left arm AVG, good thrill    Basic Metabolic Panel:  Recent Labs  Lab 05/28/19 0709 05/28/19 1452 05/29/19 0340 05/30/19 1249 05/31/19 0446  NA 131*  --  136 135 133*  K 6.2*  --  5.1 4.4 4.7  CL 93*  --  96* 96* 96*  CO2 21*  --  28 27 24   GLUCOSE 117*  --  121* 123* 102*  BUN 61*  --  37* 40* 51*  CREATININE 9.38*  --  6.20* 6.98* 8.50*  CALCIUM 9.3  --  9.3 9.1 8.5*  MG  --   --   --  2.0 2.0  PHOS  --  3.9  --  3.7 4.0     CBC: Recent Labs  Lab 05/28/19 0709 05/29/19 0340 05/30/19 1249  05/31/19 0446  WBC 11.5* 8.4 9.1 9.2  NEUTROABS 9.7*  --   --  5.7  HGB 11.0* 9.6* 10.2* 9.9*  HCT 32.3* 28.0* 29.7* 28.0*  MCV 102.5* 101.1* 101.4* 98.6  PLT 211 186 191 173      Lab Results  Component Value Date   HEPBSAG NEGATIVE 11/30/2013      Microbiology:  Recent Results (from the past 240 hour(s))  SARS Coronavirus 2 [facility] order, Performed in [facility] hospital lab) Nasopharyngeal Nasopharyngeal Swab     Status: None   Collection Time: 05/28/19  7:20 AM   Specimen: Nasopharyngeal Swab  Result Value Ref Range Status   SARS Coronavirus 2 NEGATIVE NEGATIVE Final    Comment: (NOTE) If result is NEGATIVE SARS-CoV-2 target nucleic acids are NOT DETECTED. The SARS-CoV-2 RNA is generally detectable in upper and lower  respiratory specimens during the acute phase of infection. The lowest  concentration of SARS-CoV-2 viral copies this assay can detect is 250  copies / mL. A negative result does not preclude SARS-CoV-2 infection  and should not be used as the sole basis for treatment or other  patient management decisions.  A negative result may occur with  improper specimen collection / handling, submission  of specimen other  than nasopharyngeal swab, presence of viral mutation(s) within the  areas targeted by this assay, and inadequate number of viral copies  (<250 copies / mL). A negative result must be combined with clinical  observations, patient history, and epidemiological information. If result is POSITIVE SARS-CoV-2 target nucleic acids are DETECTED. The SARS-CoV-2 RNA is generally detectable in upper and lower  respiratory specimens dur ing the acute phase of infection.  Positive  results are indicative of active infection with SARS-CoV-2.  Clinical  correlation with patient history and other diagnostic information is  necessary to determine patient infection status.  Positive results do  not rule out bacterial infection or co-infection with other  viruses. If result is PRESUMPTIVE POSTIVE SARS-CoV-2 nucleic acids MAY BE PRESENT.   A presumptive positive result was obtained on the submitted specimen  and confirmed on repeat testing.  While 2019 novel coronavirus  (SARS-CoV-2) nucleic acids may be present in the submitted sample  additional confirmatory testing may be necessary for epidemiological  and / or clinical management purposes  to differentiate between  SARS-CoV-2 and other Sarbecovirus currently known to infect humans.  If clinically indicated additional testing with an alternate test  methodology [phone]) is advised. The SARS-CoV-2 RNA is generally  detectable in upper and lower respiratory sp ecimens during the acute  phase of infection. The expected result is Negative. Fact Sheet for Patients:  StrictlyIdeas.no Fact Sheet for Healthcare Providers: BankingDealers.co.za This test is not yet approved or cleared by the [place] FDA and has been authorized for detection and/or diagnosis of SARS-CoV-2 by FDA under an Emergency Use Authorization (EUA).  This EUA will remain in effect (meaning this test can be used) for the duration of the COVID-19 declaration under Section 564(b)(1) of the Act, 21 U.S.C. section 360bbb-3(b)(1), unless the authorization is terminated or revoked sooner. Performed at [facility], [address] [place], [place] [address]   MRSA PCR Screening     Status: None   Collection Time: 05/28/19 10:36 AM   Specimen: Nasal Mucosa; Nasopharyngeal  Result Value Ref Range Status   MRSA by PCR NEGATIVE NEGATIVE Final    Comment:        The GeneXpert MRSA Assay (FDA approved for NASAL specimens only), is one component of a comprehensive MRSA colonization surveillance program. It is not intended to diagnose MRSA infection nor to guide or monitor treatment for MRSA infections. Performed at [facility], [address]  [place], [place] [address]     Coagulation Studies: Recent Labs    05/30/19 1119  LABPROT 13.7  INR 1.1    Urinalysis: No results for input(s): COLORURINE, LABSPEC, PHURINE, GLUCOSEU, HGBUR, BILIRUBINUR, KETONESUR, PROTEINUR, UROBILINOGEN, NITRITE, LEUKOCYTESUR in the last 72 hours.  Invalid input(s): APPERANCEUR    Imaging: No results found.   Medications:   . ceFEPime (MAXIPIME) IV Stopped (05/30/19 1922)  . heparin 1,200 Units/hr (05/31/19 0911)   . amiodarone  400 mg Oral BID  . amLODipine  10 mg Oral Daily  . aspirin EC  81 mg Oral Daily  . atorvastatin  40 mg Oral q1800  . budesonide  0.5 mg Nebulization Q12H  . calcium carbonate  1 tablet Oral BID WC  . carvedilol  25 mg Oral BID WC  . Chlorhexidine Gluconate Cloth  6 each Topical Q0600  . cinacalcet  30 mg Oral Q supper  . cloNIDine  0.2 mg Oral BID  . epoetin (EPOGEN/PROCRIT) injection  4,000 Units Intravenous Q  M,W,F-HD  . furosemide  80 mg Oral BID  . gabapentin  100 mg Oral TID  . hydrALAZINE  50 mg Oral TID  . influenza vac split quadrivalent PF  0.5 mL Intramuscular Tomorrow-1000  . irbesartan  300 mg Oral QHS  . loratadine  10 mg Oral Daily  . multivitamin  1 tablet Oral Daily  . nicotine  21 mg Transdermal Daily  . sevelamer carbonate  2,400 mg Oral Daily  . tiotropium  18 mcg Inhalation QPM  . zolpidem  10 mg Oral QHS   acetaminophen, albuterol, diphenhydrAMINE, docusate sodium, hydrALAZINE, nitroGLYCERIN, nitroGLYCERIN  Assessment/ Plan:  58 y.o. male with  ESRD, CAD/ CABG 2015, CHF, sleep apnea, alcohol abuse, current smoker, history of chronic lacunar infarcts within left frontal lobe white matter and left basal ganglia  [facility] MWF-1// TW 70kg   Active Problems:   HCAP (healthcare-associated pneumonia)   #. ESRD with hyperkalemia Recent Labs    05/28/19 0709 05/29/19 0340 05/30/19 1249 05/31/19 0446  CREATININE 9.38* 6.20* 6.98* 8.50*  Schedule HD on M-W-F  schedule  nxt HD to be scheduled tomorrow   #. Anemia of CKD  Lab Results  Component Value Date   HGB 9.9 (L) 05/31/2019  low dose EPO with HD   #. SHPTH  No results found for: PTH Lab Results  Component Value Date   PHOS 4.0 05/31/2019  follow low phos diet  #. HTN  Isolated systolic Continue home medications Added hydralazine 50 TID on wednesday  # Pneumonia - Antibiotics as per Primary team - COVID-19 test neg    LOS: [place] 9/17/202010:07 [place], [place]

## 2019-05-31 NOTE — Plan of Care (Signed)

## 2019-06-01 ENCOUNTER — Encounter: Payer: Self-pay | Admitting: Radiology

## 2019-06-01 ENCOUNTER — Inpatient Hospital Stay (HOSPITAL_COMMUNITY): Payer: Medicare Other

## 2019-06-01 DIAGNOSIS — R079 Chest pain, unspecified: Secondary | ICD-10-CM

## 2019-06-01 LAB — BASIC METABOLIC PANEL
Anion gap: 12 (ref 5–15)
BUN: 61 mg/dL — ABNORMAL HIGH (ref 6–20)
CO2: 24 mmol/L (ref 22–32)
Calcium: 8.5 mg/dL — ABNORMAL LOW (ref 8.9–10.3)
Chloride: 97 mmol/L — ABNORMAL LOW (ref 98–111)
Creatinine, Ser: 10.17 mg/dL — ABNORMAL HIGH (ref 0.61–1.24)
GFR calc Af Amer: 6 mL/min — ABNORMAL LOW (ref >60)
GFR calc non Af Amer: 5 mL/min — ABNORMAL LOW (ref >60)
Glucose, Bld: 97 mg/dL (ref 70–99)
Potassium: 4.8 mmol/L (ref 3.5–5.1)
Sodium: 133 mmol/L — ABNORMAL LOW (ref 135–145)

## 2019-06-01 LAB — CBC
HCT: 26 % — ABNORMAL LOW (ref 39.0–52.0)
Hemoglobin: 8.9 g/dL — ABNORMAL LOW (ref 13.0–17.0)
MCH: 34.8 pg — ABNORMAL HIGH (ref 26.0–34.0)
MCHC: 34.2 g/dL (ref 30.0–36.0)
MCV: 101.6 fL — ABNORMAL HIGH (ref 80.0–100.0)
Platelets: 161 10*3/uL (ref 150–400)
RBC: 2.56 MIL/uL — ABNORMAL LOW (ref 4.22–5.81)
RDW: 13.6 % (ref 11.5–15.5)
WBC: 7.9 10*3/uL (ref 4.0–10.5)
nRBC: 0 % (ref 0.0–0.2)

## 2019-06-01 LAB — NM MYOCAR MULTI W/SPECT W/WALL MOTION / EF
Estimated workload: 1 METS
Exercise duration (min): 0 min
Exercise duration (sec): 0 s
LV dias vol: 161 mL (ref 62–150)
LV sys vol: 76 mL
MPHR: 164 {beats}/min
Peak HR: 83 {beats}/min
Percent HR: 50 %
Rest HR: 72 {beats}/min
SDS: 0
SRS: 0
SSS: 0
TID: 1

## 2019-06-01 LAB — HEPARIN LEVEL (UNFRACTIONATED)
Heparin Unfractionated: 0.17 IU/mL — ABNORMAL LOW (ref 0.30–0.70)
Heparin Unfractionated: 0.11 IU/mL — ABNORMAL LOW (ref 0.30–0.70)
Heparin Unfractionated: 0.47 IU/mL (ref 0.30–0.70)

## 2019-06-01 MED ORDER — HYDRALAZINE HCL 50 MG PO TABS
100.0000 mg | ORAL_TABLET | Freq: Three times a day (TID) | ORAL | Status: DC
  Administered 2019-06-01: 100 mg via ORAL
  Filled 2019-06-01: qty 2

## 2019-06-01 MED ORDER — HEPARIN BOLUS VIA INFUSION
2300.0000 [IU] | Freq: Once | INTRAVENOUS | Status: AC
  Administered 2019-06-01: 2300 [IU] via INTRAVENOUS
  Filled 2019-06-01: qty 2300

## 2019-06-01 MED ORDER — REGADENOSON 0.4 MG/5ML IV SOLN
0.4000 mg | Freq: Once | INTRAVENOUS | Status: AC
  Administered 2019-06-01: 10:00:00 0.4 mg via INTRAVENOUS
  Filled 2019-06-01: qty 5

## 2019-06-01 MED ORDER — TECHNETIUM TC 99M TETROFOSMIN IV KIT
10.9920 | PACK | Freq: Once | INTRAVENOUS | Status: AC | PRN
  Administered 2019-06-01: 08:00:00 10.992 via INTRAVENOUS

## 2019-06-01 MED ORDER — TECHNETIUM TC 99M TETROFOSMIN IV KIT
31.2380 | PACK | Freq: Once | INTRAVENOUS | Status: AC | PRN
  Administered 2019-06-01: 31.238 via INTRAVENOUS

## 2019-06-01 MED ORDER — HEPARIN BOLUS VIA INFUSION
2000.0000 [IU] | Freq: Once | INTRAVENOUS | Status: AC
  Administered 2019-06-01: 02:00:00 2000 [IU] via INTRAVENOUS
  Filled 2019-06-01: qty 2000

## 2019-06-01 NOTE — Consult Note (Signed)
ANTICOAGULATION CONSULT NOTE - Follow Up Consult  Pharmacy Consult for Heparin Indication: atrial fibrillation  Allergies  Allergen Reactions  . Lisinopril     Patient Measurements: Height: 5\' 7"  (170.2 cm) Weight: 170 lb 13.7 oz (77.5 kg) IBW/kg (Calculated) : 66.1 Heparin Dosing Weight: 70.3 kg  Vital Signs: Temp: 98.2 F (36.8 C) (09/18 0800) Temp Source: Oral (09/18 0800) BP: 194/83 (09/18 0800) Pulse Rate: 66 (09/18 0800)  Labs: Recent Labs    05/30/19 1119  05/30/19 1249  05/31/19 0446 05/31/19 0853 05/31/19 1756 06/01/19 0012 06/01/19 0547 06/01/19 1046  HGB  --    < > 10.2*  --  9.9*  --   --   --  8.9*  --   HCT  --   --  29.7*  --  28.0*  --   --   --  26.0*  --   PLT  --   --  191  --  173  --   --   --  161  --   APTT 29  --   --   --   --   --   --   --   --   --   LABPROT 13.7  --   --   --   --   --   --   --   --   --   INR 1.1  --   --   --   --   --   --   --   --   --   HEPARINUNFRC  --   --   --    < >  --   --  0.11* 0.17*  --  0.11*  CREATININE  --   --  6.98*  --  8.50*  --   --   --  10.17*  --   TROPONINIHS 29*  --  33*  --   --  42*  --   --   --   --    < > = values in this interval not displayed.    Estimated Creatinine Clearance: 7.6 mL/min (A) (by C-G formula based on SCr of 10.17 mg/dL (H)).   Medications:  Heparin 5000 units Q8h (d/c, last dose 9/16 @0422 ) No anticoagulation prior to admission  Assessment: 58 y/o M with hx of CHF, ESRD on HD, CAD, HLD, and HTN admitted 9/14 with acute respiratory failure secondary to PNA. This morning patient complaining of chest tightness and burning while on HD and found to be in Afib with RVR. Now in ICU on rate control with carvedilol and rhythm control with amiodarone. Per cardiology plan is to transition to a apixaban once it is clear that no intervention is required. Patient with CHA2DS2VASc of 5 and will required continued anticoagulation.   Goal of Therapy:  Heparin level 0.3-0.7  units/ml Monitor platelets by anticoagulation protocol: Yes   Plan:  Confirmed with RN, no known stoppages int therapy. Will bolus 2300 units and increase rate to 1800 units/hr. Will obtain anti-Xa level at 2000. CBC with am labs.   Pharmacy will continue to monitor and adjust per consult.   Simpson,Michael L  06/01/2019,11:53 AM

## 2019-06-01 NOTE — Progress Notes (Signed)
HD Tx completed tolerated well, UF goal met, Pt BP elevated, MD made aware, all B/P meds held til post Tx, primary RN to dose when pt returns to unit, per protocol.    06/01/19 1845  Vital Signs  Pulse Rate 78  Pulse Rate Source Monitor  Resp 17  BP (!) 198/103  BP Location Right Arm  BP Method Automatic  Patient Position (if appropriate) Lying  Oxygen Therapy  SpO2 99 %  O2 Device Room Air  Pulse Oximetry Type Continuous  During Hemodialysis Assessment  HD Safety Checks Performed Yes  KECN 72.1 KECN  Dialysis Fluid Bolus Normal Saline  Bolus Amount (mL) 250 mL  Intra-Hemodialysis Comments Tx completed;Tolerated well

## 2019-06-01 NOTE — Progress Notes (Signed)
Pre HD Tx    06/01/19 1515  Vital Signs  Temp 98.3 F (36.8 C)  Temp Source Oral  Pulse Rate Source Monitor  Resp 15  BP (!) 186/94  BP Location Right Arm  BP Method Automatic  Patient Position (if appropriate) Lying  Oxygen Therapy  Pulse Oximetry Type Continuous  Pain Assessment  Pain Scale 0-10  Pain Score 0  Time-Out for Hemodialysis  What Procedure? HD  Pt Identifiers(min of two) First/Last Name;MRN/Account#  Correct Site? Yes  Correct Side? Yes  Correct Procedure? Yes  Consents Verified? Yes  Rad Studies Available? Yes  Safety Precautions Reviewed? Yes  Engineer, civil (consulting) Number 3  Station Number 1  UF/Alarm Test Passed  Conductivity: Meter 14  Conductivity: Machine  14  pH 7.4  Reverse Osmosis Main  Normal Saline Lot Number [id number]  Dialyzer Lot Number [id number]  Disposable Set Lot Number [id number]  Machine Temperature 98.6 F (37 C)  Musician and Audible Yes  Blood Lines Intact and Secured Yes  Pre Treatment Patient Checks  Vascular access used during treatment Fistula  Hepatitis B Surface Antigen Results Negative  Date Hepatitis B Surface Antigen Drawn 03/19/15  Hepatitis B Surface Antibody 24  Date Hepatitis B Surface Antibody Drawn 04/09/19  Hemodialysis Consent Verified Yes  Hemodialysis Standing Orders Initiated Yes  ECG (Telemetry) Monitor On Yes  Prime Ordered Normal Saline  Length of  DialysisTreatment -hour(s) 3.5 Hour(s)  Dialysis Treatment Comments Na 140  Dialyzer Elisio 17H NR  Dialysate 2K;2.5 Ca  Dialysis Anticoagulant None  Dialysate Flow Ordered 600  Blood Flow Rate Ordered 400 mL/min  Ultrafiltration Goal 3 Liters (Verbal Order change by Dr. Candiss Norse )  Dialysis Blood Pressure Support Ordered Normal Saline  Fistula / Graft Left Forearm  Placement Date: (c) 12/14/18   Placed prior to admission: Yes  Orientation: Left  Access Location: Forearm  Site Condition No complications  Fistula / Graft Assessment  Present;Thrill;Bruit  Status Patent  Drainage Description None

## 2019-06-01 NOTE — Progress Notes (Signed)
[place] [facility] [place] NAME: Daryl Weeks    MR#:  [id number]  DATE OF BIRTH:  02-09-1961  SUBJECTIVE:  CHIEF COMPLAINT:   Chief Complaint  Patient presents with  . Shortness of Breath  Leaning on one side of bed, feels short of breath REVIEW OF SYSTEMS:  CONSTITUTIONAL: No fever, fatigue or weakness.  EYES: No blurred or double vision.  EARS, NOSE, AND THROAT: No tinnitus or ear pain.  RESPIRATORY: have cough, shortness of breath, no wheezing or hemoptysis.  CARDIOVASCULAR: No chest pain, orthopnea, edema.  GASTROINTESTINAL: No nausea, vomiting, diarrhea or abdominal pain.  GENITOURINARY: No dysuria, hematuria.  ENDOCRINE: No polyuria, nocturia,  HEMATOLOGY: No anemia, easy bruising or bleeding SKIN: No rash or lesion. MUSCULOSKELETAL: No joint pain or arthritis.   NEUROLOGIC: No tingling, numbness, weakness.  PSYCHIATRY: No anxiety or depression.   ROS  DRUG ALLERGIES:   Allergies  Allergen Reactions  . Lisinopril     VITALS:  Blood pressure (!) 175/93, pulse 82, temperature 98.2 F (36.8 C), temperature source Oral, resp. rate 15, height 5\' 7"  (1.702 m), weight 77.5 kg, SpO2 96 %.  PHYSICAL EXAMINATION:  GENERAL:  58 y.o.-year-old patient lying in the bed with no acute distress.  EYES: Pupils equal, round, reactive to light and accommodation. No scleral icterus. Extraocular muscles intact.  HEENT: Head atraumatic, normocephalic. Oropharynx and nasopharynx clear.  NECK:  Supple, no jugular venous distention. No thyroid enlargement, no tenderness.  LUNGS: Normal breath sounds bilaterally, no wheezing, some crepitation. No use of accessory muscles of respiration.  CARDIOVASCULAR: S1, S2 normal. No murmurs, rubs, or gallops.  ABDOMEN: Soft, nontender, nondistended. Bowel sounds present. No organomegaly or mass.  EXTREMITIES: No pedal edema, cyanosis, or clubbing.  NEUROLOGIC: Cranial nerves II through XII are intact. Muscle  strength 5/5 in all extremities. Sensation intact. Gait not checked.  PSYCHIATRIC: The patient is alert and oriented x 3.  SKIN: No obvious rash, lesion, or ulcer.   Physical Exam LABORATORY PANEL:   CBC Recent Labs  Lab 06/01/19 0547  WBC 7.9  HGB 8.9*  HCT 26.0*  PLT 161   ------------------------------------------------------------------------------------------------------------------  Chemistries  Recent Labs  Lab 05/30/19 1249 05/31/19 0446 06/01/19 0547  NA 135 133* 133*  K 4.4 4.7 4.8  CL 96* 96* 97*  CO2 27 24 24   GLUCOSE 123* 102* 97  BUN 40* 51* 61*  CREATININE 6.98* 8.50* 10.17*  CALCIUM 9.1 8.5* 8.5*  MG 2.0 2.0  --   AST 13*  --   --   ALT 10  --   --   ALKPHOS 115  --   --   BILITOT 1.0  --   --    ------------------------------------------------------------------------------------------------------------------  Cardiac Enzymes No results for input(s): TROPONINI in the last 168 hours. ------------------------------------------------------------------------------------------------------------------  RADIOLOGY:  US Carotid Bilateral  Result Date: 05/31/2019 CLINICAL DATA:  58 year old male with a history of chest pain EXAM: BILATERAL CAROTID DUPLEX ULTRASOUND TECHNIQUE: Pearline Cables scale imaging, color Doppler and duplex ultrasound were performed of bilateral carotid and vertebral arteries in the neck. COMPARISON:  No prior duplex FINDINGS: Criteria: Quantification of carotid stenosis is based on velocity parameters that correlate the residual internal carotid diameter with NASCET-based stenosis levels, using the diameter of the distal internal carotid lumen as the denominator for stenosis measurement. The following velocity measurements were obtained: RIGHT ICA:  Systolic 99991111 cm/sec, Diastolic 24 cm/sec CCA:  81 cm/sec SYSTOLIC ICA/CCA RATIO:  1.4 ECA:  87 cm/sec  LEFT ICA:  Systolic 96 cm/sec, Diastolic 22 cm/sec CCA:  83 cm/sec SYSTOLIC ICA/CCA RATIO:  1.2 ECA:   135 cm/sec Right Brachial SBP: Not acquired Left Brachial SBP: Not acquired RIGHT CAROTID ARTERY: No significant calcifications of the right common carotid artery. Intermediate waveform maintained. Moderate heterogeneous and partially calcified plaque at the right carotid bifurcation. No significant lumen shadowing. Low resistance waveform of the right ICA. Tortuosity RIGHT VERTEBRAL ARTERY: Antegrade flow with low resistance waveform. LEFT CAROTID ARTERY: No significant calcifications of the left common carotid artery. Intermediate waveform maintained. Moderate heterogeneous and partially calcified plaque at the left carotid bifurcation. No significant lumen shadowing. Low resistance waveform of the left ICA. Tortuosity LEFT VERTEBRAL ARTERY:  Antegrade flow with low resistance waveform. Additional: Nonocclusive thrombus of the right internal jugular vein IMPRESSION: Color duplex indicates moderate heterogeneous and calcified plaque, with no hemodynamically significant stenosis by duplex criteria in the extracranial cerebrovascular circulation. Incidental note made of nonocclusive thrombus of the right IJ vein. Correlation with any history of central venous catheter may be useful. Signed, Dulcy Fanny. Dellia Nims, RPVI [facility] Electronically Signed   By: Corrie Mckusick D.O.   On: 05/31/2019 15:15    ASSESSMENT AND PLAN:   Active Problems:   HCAP (healthcare-associated pneumonia)  * Atrial fibrillation with rapid ventricular response -new onset, now converted to sinus rhythm -2D echo shows EF of 50 to 55% - Continue Coreg for rate control - on heparin. stress test earlier today-cardiology considering transition to NOAC --Continue BB, amiodarone.   * sepsis due to Pneumonia   Cont IV Abx.   MRSA and COVID test negative.   * Ac respi failure- hypoxia   Supplemental oxygen.    * Speech difficulty -could be TIA, it is resolved   Stat CT head  as he had 2 falls- negative.  MRI negative. -Continue ASA and atorvastatin.  * Uncontrolled Htn   Cont home meds, Give Hydralazine IV PRN  * ESRD On HD  getting dialyzed per nephrology.  Monday Wednesday Friday -Dialysis scheduled for today  * Hyperkalemia Resolved after dialysis  * Active smoker  Counselled to Quit for 4 min.  By admitting doctor      All the records are reviewed and case discussed with Care Management/Social Workerr. Management plans discussed with the patient, nursing, ICU team and they are in agreement.  CODE STATUS: full.  TOTAL TIME TAKING CARE OF THIS PATIENT: 15 minutes.     POSSIBLE D/C IN 2-3 DAYS, DEPENDING ON CLINICAL CONDITION.   Max Sane M.D on 06/01/2019   Between 7am to 6pm - Pager - [phone]  After 6pm go to [email or web address] - password [email or web address] [facility]  Office  [phone]  CC: Primary care physician; Mikey College, NP  Note: This dictation was prepared with Dragon dictation along with smaller phrase technology. Any transcriptional errors that result from this process are unintentional.

## 2019-06-01 NOTE — Progress Notes (Signed)
[facility], [place] 06/01/19  Subjective:   LOS: 4 09/17 0701 - 09/18 0700 In: 1130.2 [P.O.:600; I.V.:420.2; IV Piggyback:100] Out: 0    Patient had tachycardia and chest pain at dialysis last treament leading to rapid response. Dx with A Fib with RVR Underwent cardiac stress test this morning Feels well this morning Denies SOB Currently on room air  Objective:  Vital signs in last 24 hours:  Temp:  [97.4 F (36.3 C)-98.2 F (36.8 C)] 97.9 F (36.6 C) (09/18 1412) Pulse Rate:  [63-124] 66 (09/18 1412) Resp:  [8-21] 8 (09/18 1412) BP: (153-218)/(72-116) 173/91 (09/18 1412) SpO2:  [90 %-98 %] 92 % (09/18 1412)  Weight change:  Filed Weights   05/28/19 0703 05/30/19 0830 05/30/19 0843  Weight: 70.3 kg 77.5 kg 77.5 kg    Intake/Output:    Intake/Output Summary (Last 24 hours) at 06/01/2019 1510 Last data filed at 06/01/2019 0841 Gross per 24 hour  Intake 715.75 ml  Output 0 ml  Net 715.75 ml    Gen:   Alert, cooperative, no distress, appears older than stated age Head:   Normocephalic, without obvious abnormality, atraumatic Eyes/ENT:  conjunctiva clear,  moist oral mucus membranes, periorbital edema Neck:  Supple,  thyroid: not enlarged, no JVD Lungs:   Clear to auscultation today, room air Heart:   Regular rhythm, no, rub or gallop Abdomen:   Soft, non-tender,   Extremities: no cyanosis or edema Neurologic: Alert and oriented, able to answer questions appropriately Left arm AVG, good thrill    Basic Metabolic Panel:  Recent Labs  Lab 05/28/19 0709 05/28/19 1452 05/29/19 0340 05/30/19 1249 05/31/19 0446 06/01/19 0547  NA 131*  --  136 135 133* 133*  K 6.2*  --  5.1 4.4 4.7 4.8  CL 93*  --  96* 96* 96* 97*  CO2 21*  --  28 27 24 24   GLUCOSE 117*  --  121* 123* 102* 97  BUN 61*  --  37* 40* 51* 61*  CREATININE 9.38*  --  6.20* 6.98* 8.50* 10.17*  CALCIUM 9.3  --  9.3 9.1 8.5* 8.5*  MG  --   --   --  2.0 2.0  --   PHOS   --  3.9  --  3.7 4.0  --      CBC: Recent Labs  Lab 05/28/19 0709 05/29/19 0340 05/30/19 1249 05/31/19 0446 06/01/19 0547  WBC 11.5* 8.4 9.1 9.2 7.9  NEUTROABS 9.7*  --   --  5.7  --   HGB 11.0* 9.6* 10.2* 9.9* 8.9*  HCT 32.3* 28.0* 29.7* 28.0* 26.0*  MCV 102.5* 101.1* 101.4* 98.6 101.6*  PLT 211 186 191 173 161      Lab Results  Component Value Date   HEPBSAG NEGATIVE 11/30/2013      Microbiology:  Recent Results (from the past 240 hour(s))  SARS Coronavirus 2 [facility] order, Performed in [facility] hospital lab) Nasopharyngeal Nasopharyngeal Swab     Status: None   Collection Time: 05/28/19  7:20 AM   Specimen: Nasopharyngeal Swab  Result Value Ref Range Status   SARS Coronavirus 2 NEGATIVE NEGATIVE Final    Comment: (NOTE) If result is NEGATIVE SARS-CoV-2 target nucleic acids are NOT DETECTED. The SARS-CoV-2 RNA is generally detectable in upper and lower  respiratory specimens during the acute phase of infection. The lowest  concentration of SARS-CoV-2 viral copies this assay can detect is 250  copies / mL. A negative result does not  preclude SARS-CoV-2 infection  and should not be used as the sole basis for treatment or other  patient management decisions.  A negative result may occur with  improper specimen collection / handling, submission of specimen other  than nasopharyngeal swab, presence of viral mutation(s) within the  areas targeted by this assay, and inadequate number of viral copies  (<250 copies / mL). A negative result must be combined with clinical  observations, patient history, and epidemiological information. If result is POSITIVE SARS-CoV-2 target nucleic acids are DETECTED. The SARS-CoV-2 RNA is generally detectable in upper and lower  respiratory specimens dur ing the acute phase of infection.  Positive  results are indicative of active infection with SARS-CoV-2.  Clinical  correlation with patient history and other diagnostic  information is  necessary to determine patient infection status.  Positive results do  not rule out bacterial infection or co-infection with other viruses. If result is PRESUMPTIVE POSTIVE SARS-CoV-2 nucleic acids MAY BE PRESENT.   A presumptive positive result was obtained on the submitted specimen  and confirmed on repeat testing.  While 2019 novel coronavirus  (SARS-CoV-2) nucleic acids may be present in the submitted sample  additional confirmatory testing may be necessary for epidemiological  and / or clinical management purposes  to differentiate between  SARS-CoV-2 and other Sarbecovirus currently known to infect humans.  If clinically indicated additional testing with an alternate test  methodology [phone]) is advised. The SARS-CoV-2 RNA is generally  detectable in upper and lower respiratory sp ecimens during the acute  phase of infection. The expected result is Negative. Fact Sheet for Patients:  StrictlyIdeas.no Fact Sheet for Healthcare Providers: BankingDealers.co.za This test is not yet approved or cleared by the [place] FDA and has been authorized for detection and/or diagnosis of SARS-CoV-2 by FDA under an Emergency Use Authorization (EUA).  This EUA will remain in effect (meaning this test can be used) for the duration of the COVID-19 declaration under Section 564(b)(1) of the Act, 21 U.S.C. section 360bbb-3(b)(1), unless the authorization is terminated or revoked sooner. Performed at [facility], [address] [place], [place] [address]   MRSA PCR Screening     Status: None   Collection Time: 05/28/19 10:36 AM   Specimen: Nasal Mucosa; Nasopharyngeal  Result Value Ref Range Status   MRSA by PCR NEGATIVE NEGATIVE Final    Comment:        The GeneXpert MRSA Assay (FDA approved for NASAL specimens only), is one component of a comprehensive MRSA colonization surveillance program. It is  not intended to diagnose MRSA infection nor to guide or monitor treatment for MRSA infections. Performed at [facility], [address] [place], [place] [address]     Coagulation Studies: Recent Labs    05/30/19 1119  LABPROT 13.7  INR 1.1    Urinalysis: No results for input(s): COLORURINE, LABSPEC, PHURINE, GLUCOSEU, HGBUR, BILIRUBINUR, KETONESUR, PROTEINUR, UROBILINOGEN, NITRITE, LEUKOCYTESUR in the last 72 hours.  Invalid input(s): APPERANCEUR    Imaging: US Carotid Bilateral  Result Date: 05/31/2019 CLINICAL DATA:  58 year old male with a history of chest pain EXAM: BILATERAL CAROTID DUPLEX ULTRASOUND TECHNIQUE: Pearline Cables scale imaging, color Doppler and duplex ultrasound were performed of bilateral carotid and vertebral arteries in the neck. COMPARISON:  No prior duplex FINDINGS: Criteria: Quantification of carotid stenosis is based on velocity parameters that correlate the residual internal carotid diameter with NASCET-based stenosis levels, using the diameter of the distal internal carotid lumen as the denominator for stenosis measurement.  The following velocity measurements were obtained: RIGHT ICA:  Systolic 99991111 cm/sec, Diastolic 24 cm/sec CCA:  81 cm/sec SYSTOLIC ICA/CCA RATIO:  1.4 ECA:  87 cm/sec LEFT ICA:  Systolic 96 cm/sec, Diastolic 22 cm/sec CCA:  83 cm/sec SYSTOLIC ICA/CCA RATIO:  1.2 ECA:  135 cm/sec Right Brachial SBP: Not acquired Left Brachial SBP: Not acquired RIGHT CAROTID ARTERY: No significant calcifications of the right common carotid artery. Intermediate waveform maintained. Moderate heterogeneous and partially calcified plaque at the right carotid bifurcation. No significant lumen shadowing. Low resistance waveform of the right ICA. Tortuosity RIGHT VERTEBRAL ARTERY: Antegrade flow with low resistance waveform. LEFT CAROTID ARTERY: No significant calcifications of the left common carotid artery. Intermediate waveform maintained. Moderate heterogeneous and  partially calcified plaque at the left carotid bifurcation. No significant lumen shadowing. Low resistance waveform of the left ICA. Tortuosity LEFT VERTEBRAL ARTERY:  Antegrade flow with low resistance waveform. Additional: Nonocclusive thrombus of the right internal jugular vein IMPRESSION: Color duplex indicates moderate heterogeneous and calcified plaque, with no hemodynamically significant stenosis by duplex criteria in the extracranial cerebrovascular circulation. Incidental note made of nonocclusive thrombus of the right IJ vein. Correlation with any history of central venous catheter may be useful. Signed, Dulcy Fanny. Dellia Nims, RPVI [facility] Electronically Signed   By: Corrie Mckusick D.O.   On: 05/31/2019 15:15   Nm Myocar Multi W/spect W/wall Motion / Ef  Result Date: 06/01/2019 Pharmacological myocardial perfusion imaging study with no significant ischemia Small mild fixed apical perfusion defect, unable to exclude attenuation artifact vs small prior MI. Normal wall motion, EF estimated at 40% (decreased EF secondary to GI uptake artifact) No EKG changes concerning for ischemia at peak stress or in recovery. Baseline EKG with ST abn in V5 and V6, more pronounced in V4 to V6 with lexiscan infusion Low risk scan Signed, Esmond Plants, MD, Ph.D [facility]     Medications:   . sodium chloride 5 mL/hr at 06/01/19 0841  . heparin 1,800 Units/hr (06/01/19 1155)   . amiodarone  400 mg Oral BID  . amLODipine  10 mg Oral Daily  . aspirin EC  81 mg Oral Daily  . atorvastatin  40 mg Oral q1800  . budesonide  0.5 mg Nebulization Q12H  . calcium carbonate  1 tablet Oral BID WC  . carvedilol  25 mg Oral BID WC  . Chlorhexidine Gluconate Cloth  6 each Topical Q0600  . cinacalcet  30 mg Oral Q supper  . cloNIDine  0.2 mg Oral BID  . epoetin (EPOGEN/PROCRIT) injection  4,000 Units Intravenous Q M,W,F-HD  . furosemide  80 mg Oral BID  .  gabapentin  100 mg Oral TID  . hydrALAZINE  50 mg Oral TID  . influenza vac split quadrivalent PF  0.5 mL Intramuscular Tomorrow-1000  . irbesartan  300 mg Oral QHS  . loratadine  10 mg Oral Daily  . multivitamin  1 tablet Oral Daily  . nicotine  21 mg Transdermal Daily  . sevelamer carbonate  2,400 mg Oral Daily  . tiotropium  18 mcg Inhalation QPM  . zolpidem  10 mg Oral QHS   sodium chloride, acetaminophen, albuterol, diphenhydrAMINE, docusate sodium, hydrALAZINE, metoprolol tartrate, nitroGLYCERIN, nitroGLYCERIN  Assessment/ Plan:  58 y.o. male with  ESRD, CAD/ CABG 2015, CHF, sleep apnea, alcohol abuse, current smoker, history of chronic lacunar infarcts within left frontal lobe white matter and left basal ganglia  [facility] MWF-1// TW 70kg  Active Problems:   HCAP (healthcare-associated pneumonia)   #. ESRD with hyperkalemia Recent Labs    05/29/19 0340 05/30/19 1249 05/31/19 0446 06/01/19 0547  CREATININE 6.20* 6.98* 8.50* 10.17*  Schedule HD on M-W-F schedule Patient seen during dialysis Tolerating well    HEMODIALYSIS FLOWSHEET:  Blood Flow Rate (mL/min): 400 mL/min Arterial Pressure (mmHg): -120 mmHg Venous Pressure (mmHg): 100 mmHg Transmembrane Pressure (mmHg): 60 mmHg Ultrafiltration Rate (mL/min): 720 mL/min Dialysate Flow Rate (mL/min): 600 ml/min Conductivity: Machine : 14.2 Conductivity: Machine : 14.2 Dialysis Fluid Bolus: Normal Saline Bolus Amount (mL): 250 mL     #. Anemia of CKD  Lab Results  Component Value Date   HGB 8.9 (L) 06/01/2019  low dose EPO with HD   #. SHPTH  No results found for: PTH Lab Results  Component Value Date   PHOS 4.0 05/31/2019  follow low phos diet  #. HTN  Isolated systolic Continue home medications Added hydralazine 50 TID on Wednesday Increase to 100 tid  # Pneumonia - Antibiotics as per Primary team - COVID-19 test neg  # A Fib with RVR Stress test done  today Converted to sinus with amiodarone    LOS: Daryl Weeks 9/18/20203:10 PM  [place], [place]

## 2019-06-01 NOTE — Progress Notes (Signed)
Post HD Tx    06/01/19 1858  Hand-Off documentation  Report given to (Full Name) Adalberto Ill, RN  Report received from (Full Name) Beatris Ship, RN   Vital Signs  Temp 98.4 F (36.9 C)  Temp Source Oral  Pulse Rate 79  Pulse Rate Source Monitor  Resp (!) 26  BP (!) 205/108  BP Location Right Arm  BP Method Automatic  Patient Position (if appropriate) Lying  Oxygen Therapy  SpO2 98 %  O2 Device Room Air  Pulse Oximetry Type Continuous  Pain Assessment  Pain Scale 0-10  Pain Score 0  Post-Hemodialysis Assessment  Rinseback Volume (mL) 250 mL  KECN 72.1 V  Dialyzer Clearance Lightly streaked  Duration of HD Treatment -hour(s) 3.5 hour(s)  Hemodialysis Intake (mL) 500 mL  UF Total -Machine (mL) 3523 mL  Net UF (mL) 3023 mL  Tolerated HD Treatment Yes  AVG/AVF Arterial Site Held (minutes) 10 minutes  AVG/AVF Venous Site Held (minutes) 5 minutes  Education / Care Plan  Dialysis Education Provided Yes  Documented Education in Care Plan Yes  Fistula / Graft Left Forearm  Placement Date: (c) 12/14/18   Placed prior to admission: Yes  Orientation: Left  Access Location: Forearm  Site Condition No complications  Fistula / Graft Assessment Present;Thrill;Bruit  Drainage Description None

## 2019-06-01 NOTE — Progress Notes (Signed)
Attestation signed by Minna Merritts, MD at 06/01/2019 4:53 PM  Attending Note Patient seen and examined, agree with detailed note above,  Patient presentation and plan discussed on rounds.   EKG lab work, chest x-ray, echocardiogram, stress test reviewed independently by myself  Reports that he feels better, less shortness of breath, less chest tightness Telemetry reviewed showing converted to normal sinus rhythm 12:20 AM  Stress test this morning, no significant ischemia  On examination : alert oriented, no JVD, lungs clear to auscultation bilaterally, heart sounds regular normal S1-S2 no murmurs appreciated, abdomen soft nontender no significant lower extremity edema.  Musculoskeletal exam with good range of motion, neurologic exam grossly nonfocal  Lab work reviewed showing creatinine 10, BUN 61 calcium 4.8 sodium 133 hemoglobin 8.9  A/P: Atrial fibrillation with RVR Based on his history likely with paroxysmal episodes Severe symptoms on dialysis 16 2020 with rapid rate, symptomatic with shortness of breath and chest pain -Echocardiogram reviewed showing low normal ejection fraction, severely dilated left atrium -Would continue metoprolol 100 twice daily, amiodarone 400 twice daily x 4 days, then 200 BID with outpt follow up -CHA2DS2VASc equals 5 Eliquis 5 BID (will need to confirm that nephrology is okay with a NOAC)  CAD with stable angina Had anginal symptoms in the setting of atrial fibrillation Stress test today no ischemia Results discussed with him  End-stage renal disease on hemodialysis Fluid status managed by dialysis 3 days a week On low-dose EPO Examined on hemodialysis today  Essential hypertension  Carvedilol, amlodipine, clonidine, irbesartan, hydralazine Potentially could add isosorbide mononitrate as blood pressure continues to run high  Smoker Cessation recommended  Alcohol abuse Recommended alcohol cessation    Greater than 50%  was spent in counseling and coordination of care with patient Total encounter time 25 minutes or more   Signed: Esmond Plants  M.D., Ph.D. [facility]

## 2019-06-01 NOTE — Progress Notes (Signed)
ANTICOAGULATION CONSULT NOTE - Initial Consult  Pharmacy Consult for Heparin  Indication: atrial fibrillation  Allergies  Allergen Reactions  . Lisinopril     Patient Measurements: Height: 5\' 7"  (170.2 cm) Weight: 170 lb 13.7 oz (77.5 kg) IBW/kg (Calculated) : 66.1 Heparin Dosing Weight:  70.3 kg   Vital Signs: Temp: 97.6 F (36.4 C) (09/18 1934) Temp Source: Axillary (09/18 1934) BP: 176/80 (09/18 2100) Pulse Rate: 85 (09/18 2100)  Labs: Recent Labs    05/30/19 1119  05/30/19 1249  05/31/19 0446 05/31/19 0853  06/01/19 0012 06/01/19 0547 06/01/19 1046 06/01/19 1951  HGB  --    < > 10.2*  --  9.9*  --   --   --  8.9*  --   --   HCT  --   --  29.7*  --  28.0*  --   --   --  26.0*  --   --   PLT  --   --  191  --  173  --   --   --  161  --   --   APTT 29  --   --   --   --   --   --   --   --   --   --   LABPROT 13.7  --   --   --   --   --   --   --   --   --   --   INR 1.1  --   --   --   --   --   --   --   --   --   --   HEPARINUNFRC  --   --   --    < >  --   --    < > 0.17*  --  0.11* 0.47  CREATININE  --   --  6.98*  --  8.50*  --   --   --  10.17*  --   --   TROPONINIHS 29*  --  33*  --   --  42*  --   --   --   --   --    < > = values in this interval not displayed.    Estimated Creatinine Clearance: 7.6 mL/min (A) (by C-G formula based on SCr of 10.17 mg/dL (H)).   Medical History: Past Medical History:  Diagnosis Date  . (HFpEF) heart failure with preserved ejection fraction ([place])    a. 05/2019 Echo: EF 50-55%, diast dysfxn, RVSP 56.62mmHg, Sev dil LA. Mildly dil PA.  [name] Anemia   . Anxiety   . Arthritis   . Colon polyps   . COPD (chronic obstructive pulmonary disease) ([place])   . Coronary artery disease    a. 10/2013 NSTEMI/Cath: Severe 3VD-->CABG x 4 @ [facility] 01/2014 (LIMA->LAD, VG->OM1->OM2, VG->RCA); b. 11/2014 MV: No isch/infarct.  [name] ESRD (end stage renal disease) ([place])    a. MWFSat HD  . ETOH abuse    a. 2 beers/night.  [name] GERD (gastroesophageal  reflux disease)   . History of pneumonia   . Hyperlipidemia   . Hypertension   . IgA nephropathy   . Lower GI bleed    a. Due to colon polyps. Status post resection of 14 polyps  . Non-ST elevation MI (NSTEMI) ([place])   . Rheumatoid arthritis ([place])   . Shortness of breath   . Sleep apnea   . Tobacco abuse  Medications:  Heparin 5000 units Q8h (d/c, last dose 9/16 @0422 ) No anticoagulation prior to admission  Assessment: 58 y/o M with hx of CHF, ESRD on HD, CAD, HLD, and HTN admitted 9/14 with acute respiratory failure secondary to PNA. This morning patient complaining of chest tightness and burning while on HD and found to be in Afib with RVR. Now in ICU on rate control with carvedilol and rhythm control with amiodarone. Per cardiology plan is to transition to a apixaban once it is clear that no intervention is required. Patient with CHA2DS2VASc of 5 and will required continued anticoagulation.   Goal of Therapy:  Heparin level 0.3-0.7 units/ml Monitor platelets by anticoagulation protocol: Yes   Plan:  Confirmed with RN, no known stoppages int therapy. Will bolus 2300 units and increase rate to 1800 units/hr. Will obtain anti-Xa level at 2000. CBC with am labs.   9/18:  HL @ 1951 = 0.47 Will continue pt on current rate and recheck HL in 8 hrs on 9/19 @ 0400.   Catalia Massett D 06/01/2019,9:43 PM

## 2019-06-01 NOTE — Progress Notes (Signed)
Pharmacy Antibiotic Note  Daryl Weeks is a 58 y.o. male admitted on 05/28/2019 with pneumonia, suspected to be healthcare-associated due to patient on dialysis. CXR on admission concerning for multifocal infection. History significant for CHF, ESRD on HD, CAD, hyperlipidemia, hypertension. Patient's HD scheduled for MWF. Previously given vancomycin, Zosyn, and levofloxacin for sepsis on admission, but has since been narrowed down to cefepime for HCAP. Pharmacy has been consulted for cefepime dosing.   Today is Day 5 of therapy. Leukocytosis has resolved and patient afebrile. Plan to discontinue therapy today per discussion with provider.  Plan: Stop cefepime 1g Q24H (renally adjusted).  Height: 5\' 7"  (170.2 cm) Weight: 170 lb 13.7 oz (77.5 kg) IBW/kg (Calculated) : 66.1  Temp (24hrs), Avg:97.8 F (36.6 C), Min:97.4 F (36.3 C), Max:98.2 F (36.8 C)  Recent Labs  Lab 05/28/19 0709 05/29/19 0340 05/30/19 1249 05/31/19 0446 06/01/19 0547  WBC 11.5* 8.4 9.1 9.2 7.9  CREATININE 9.38* 6.20* 6.98* 8.50* 10.17*    Estimated Creatinine Clearance: 7.6 mL/min (A) (by C-G formula based on SCr of 10.17 mg/dL (H)).    Allergies  Allergen Reactions  . Lisinopril     Antimicrobials this admission: Levofloxacin 09/14 x 1 Zosyn 09/14 x 1 Vancomycin 09/14 x 1 Cefepime 09/15 >> 9/18  Dose adjustments this admission: n/a  Microbiology results: 09/14 MRSA PCR: negative  Thank you for allowing pharmacy to be a part of this patient's care.  [place] Resident 06/01/2019 1:05 PM

## 2019-06-01 NOTE — Consult Note (Signed)
ANTICOAGULATION CONSULT NOTE - Follow Up Consult  Pharmacy Consult for Heparin Indication: atrial fibrillation  Allergies  Allergen Reactions  . Lisinopril     Patient Measurements: Height: 5\' 7"  (170.2 cm) Weight: 170 lb 13.7 oz (77.5 kg) IBW/kg (Calculated) : 66.1 Heparin Dosing Weight: 70.3 kg  Vital Signs: Temp: 97.4 F (36.3 C) (09/17 2000) Temp Source: Oral (09/17 2000) BP: 153/72 (09/18 0047) Pulse Rate: 63 (09/18 0047)  Labs: Recent Labs    05/29/19 0340 05/30/19 1119 05/30/19 1249 05/30/19 1933 05/31/19 0446 05/31/19 0853 05/31/19 1756 06/01/19 0012  HGB 9.6*  --  10.2*  --  9.9*  --   --   --   HCT 28.0*  --  29.7*  --  28.0*  --   --   --   PLT 186  --  191  --  173  --   --   --   APTT  --  29  --   --   --   --   --   --   LABPROT  --  13.7  --   --   --   --   --   --   INR  --  1.1  --   --   --   --   --   --   HEPARINUNFRC  --   --   --  <0.10*  --   --  0.11* 0.17*  CREATININE 6.20*  --  6.98*  --  8.50*  --   --   --   TROPONINIHS  --  29* 33*  --   --  42*  --   --     Estimated Creatinine Clearance: 9.1 mL/min (A) (by C-G formula based on SCr of 8.5 mg/dL (H)).   Medications:  Heparin 5000 units Q8h (d/c, last dose 9/16 @0422 ) No anticoagulation prior to admission  Assessment: 58 y/o M with hx of CHF, ESRD on HD, CAD, HLD, and HTN admitted 9/14 with acute respiratory failure secondary to PNA. This morning patient complaining of chest tightness and burning while on HD and found to be in Afib with RVR. Now in ICU on rate control with carvedilol and rhythm control with amiodarone. Per cardiology plan is to transition to a apixaban once it is clear that no intervention is required.  [id number] @ 1756 HL 0.11, subtherapeutic. Confirmed with nurse, that heparin was temporarily interrupted for lab draw (for ~10-15 minutes). Likely will not cause heparin to be subtherapeutic. However, heparin level could have been impacted by obtaining a delayed level.   [id number] @ 0012 HL 0.17, subtherapeutic.    Goal of Therapy:  Heparin level 0.3-0.7 units/ml Monitor platelets by anticoagulation protocol: Yes   Plan:  Heparin bolus of 2000 units x1 and increase maintenance infusion rate of heparin to 1600 units/hr. Will obtain anti-Xa level in 6 hours, s/p dose change. Continue to follow daily CBC per protocol.   [place] Crane, Ilan Kahrs A  06/01/2019,1:42 AM

## 2019-06-01 NOTE — Progress Notes (Signed)
HD Tx started   06/01/19 1517  Vital Signs  Pulse Rate 73  Pulse Rate Source Monitor  Resp 16  BP (!) 184/92  BP Location Right Arm  BP Method Automatic  Patient Position (if appropriate) Lying  Oxygen Therapy  SpO2 96 %  Pulse Oximetry Type Continuous  During Hemodialysis Assessment  Blood Flow Rate (mL/min) 400 mL/min  Arterial Pressure (mmHg) -130 mmHg  Venous Pressure (mmHg) 170 mmHg  Transmembrane Pressure (mmHg) 60 mmHg  Ultrafiltration Rate (mL/min) 1150 mL/min  Dialysate Flow Rate (mL/min) 600 ml/min  Conductivity: Machine  14  HD Safety Checks Performed Yes  Dialysis Fluid Bolus Normal Saline  Bolus Amount (mL) 250 mL  Intra-Hemodialysis Comments Tx initiated  Fistula / Graft Left Forearm  Placement Date: (c) 12/14/18   Placed prior to admission: Yes  Orientation: Left  Access Location: Forearm  Site Condition No complications  Status Accessed  Needle Size 15 g

## 2019-06-01 NOTE — Progress Notes (Addendum)
Progress Note  Patient Name: Daryl Weeks Date of Encounter: 06/01/2019  Primary Cardiologist: Kathlyn Sacramento, MD   Subjective   Examined during his stress test today, at which time his is in [place].  Stated he feels his heart racing and occasional palpitations. No chest pain. Stated he "feels SOB and wheezy." Also feels he has some LEE, stating he needs to go to dialysis.   Inpatient Medications    Scheduled Meds: . amiodarone  400 mg Oral BID  . amLODipine  10 mg Oral Daily  . aspirin EC  81 mg Oral Daily  . atorvastatin  40 mg Oral q1800  . budesonide  0.5 mg Nebulization Q12H  . calcium carbonate  1 tablet Oral BID WC  . carvedilol  25 mg Oral BID WC  . Chlorhexidine Gluconate Cloth  6 each Topical Q0600  . cinacalcet  30 mg Oral Q supper  . cloNIDine  0.2 mg Oral BID  . epoetin (EPOGEN/PROCRIT) injection  4,000 Units Intravenous Q M,W,F-HD  . furosemide  80 mg Oral BID  . gabapentin  100 mg Oral TID  . hydrALAZINE  50 mg Oral TID  . influenza vac split quadrivalent PF  0.5 mL Intramuscular Tomorrow-1000  . irbesartan  300 mg Oral QHS  . loratadine  10 mg Oral Daily  . multivitamin  1 tablet Oral Daily  . nicotine  21 mg Transdermal Daily  . sevelamer carbonate  2,400 mg Oral Daily  . tiotropium  18 mcg Inhalation QPM  . zolpidem  10 mg Oral QHS   Continuous Infusions: . sodium chloride 5 mL/hr at 06/01/19 0841  . ceFEPime (MAXIPIME) IV 1 g (05/31/19 1732)  . heparin 1,600 Units/hr (06/01/19 0841)   PRN Meds: sodium chloride, acetaminophen, albuterol, diphenhydrAMINE, docusate sodium, hydrALAZINE, metoprolol tartrate, nitroGLYCERIN, nitroGLYCERIN   Vital Signs    Vitals:   06/01/19 0413 06/01/19 0500 06/01/19 0600 06/01/19 0800  BP: (!) 169/94   (!) 194/83  Pulse: 64 63 75 66  Resp: 19 (!) 8 18 (!) 8  Temp:    98.2 F (36.8 C)  TempSrc:    Oral  SpO2: 94% 92% 94% 95%  Weight:      Height:        Intake/Output Summary (Last 24 hours) at 06/01/2019 0927  Last data filed at 06/01/2019 0841 Gross per 24 hour  Intake 1066.38 ml  Output 0 ml  Net 1066.38 ml   Last 3 Weights 05/30/2019 05/30/2019 05/28/2019  Weight (lbs) 170 lb 13.7 oz 170 lb 13.7 oz 155 lb  Weight (kg) 77.5 kg 77.5 kg 70.308 kg      Telemetry    SR/ST on telemetry at stress testing, previously noted to be in Afib - Personally Reviewed  ECG    No new tracings- Personally Reviewed  Physical Exam   GEN: No acute distress.  Pending stress testing Neck: No JVD, JVP ~10-12cm Cardiac: tachycardic but regular, no murmurs, rubs, or gallops.  Respiratory: Bilateral weezing GI: Soft, nontender, non-distended  MS: No edema; No deformity. Neuro:  Nonfocal  Psych: Normal affect   Labs    High Sensitivity Troponin:   Recent Labs  Lab 05/28/19 0709 05/30/19 1119 05/30/19 1249 05/31/19 0853  TROPONINIHS 24* 29* 33* 42*      Cardiac EnzymesNo results for input(s): TROPONINI in the last 168 hours. No results for input(s): TROPIPOC in the last 168 hours.   Chemistry Recent Labs  Lab 05/30/19 1249 05/31/19 0446 06/01/19 [id number]  NA 135 133* 133*  K 4.4 4.7 4.8  CL 96* 96* 97*  CO2 27 24 24   GLUCOSE 123* 102* 97  BUN 40* 51* 61*  CREATININE 6.98* 8.50* 10.17*  CALCIUM 9.1 8.5* 8.5*  PROT 7.2  --   --   ALBUMIN 3.5  --   --   AST 13*  --   --   ALT 10  --   --   ALKPHOS 115  --   --   BILITOT 1.0  --   --   GFRNONAA 8* 6* 5*  GFRAA 9* 7* 6*  ANIONGAP 12 13 12      Hematology Recent Labs  Lab 05/30/19 1249 05/31/19 0446 06/01/19 0547  WBC 9.1 9.2 7.9  RBC 2.93* 2.84* 2.56*  HGB 10.2* 9.9* 8.9*  HCT 29.7* 28.0* 26.0*  MCV 101.4* 98.6 101.6*  MCH 34.8* 34.9* 34.8*  MCHC 34.3 35.4 34.2  RDW 13.7 13.7 13.6  PLT 191 173 161    BNP Recent Labs  Lab 05/28/19 0709  BNP >4,500.0*     DDimer No results for input(s): DDIMER in the last 168 hours.   Radiology    US Carotid Bilateral  Result Date: 05/31/2019 CLINICAL DATA:  58 year old male with a  history of chest pain EXAM: BILATERAL CAROTID DUPLEX ULTRASOUND TECHNIQUE: Pearline Cables scale imaging, color Doppler and duplex ultrasound were performed of bilateral carotid and vertebral arteries in the neck. COMPARISON:  No prior duplex FINDINGS: Criteria: Quantification of carotid stenosis is based on velocity parameters that correlate the residual internal carotid diameter with NASCET-based stenosis levels, using the diameter of the distal internal carotid lumen as the denominator for stenosis measurement. The following velocity measurements were obtained: RIGHT ICA:  Systolic 99991111 cm/sec, Diastolic 24 cm/sec CCA:  81 cm/sec SYSTOLIC ICA/CCA RATIO:  1.4 ECA:  87 cm/sec LEFT ICA:  Systolic 96 cm/sec, Diastolic 22 cm/sec CCA:  83 cm/sec SYSTOLIC ICA/CCA RATIO:  1.2 ECA:  135 cm/sec Right Brachial SBP: Not acquired Left Brachial SBP: Not acquired RIGHT CAROTID ARTERY: No significant calcifications of the right common carotid artery. Intermediate waveform maintained. Moderate heterogeneous and partially calcified plaque at the right carotid bifurcation. No significant lumen shadowing. Low resistance waveform of the right ICA. Tortuosity RIGHT VERTEBRAL ARTERY: Antegrade flow with low resistance waveform. LEFT CAROTID ARTERY: No significant calcifications of the left common carotid artery. Intermediate waveform maintained. Moderate heterogeneous and partially calcified plaque at the left carotid bifurcation. No significant lumen shadowing. Low resistance waveform of the left ICA. Tortuosity LEFT VERTEBRAL ARTERY:  Antegrade flow with low resistance waveform. Additional: Nonocclusive thrombus of the right internal jugular vein IMPRESSION: Color duplex indicates moderate heterogeneous and calcified plaque, with no hemodynamically significant stenosis by duplex criteria in the extracranial cerebrovascular circulation. Incidental note made of nonocclusive thrombus of the right IJ vein. Correlation with any history of central  venous catheter may be useful. Signed, Dulcy Fanny. Dellia Nims, RPVI [facility] Radiology Electronically Signed   By: Corrie Mckusick D.O.   On: 05/31/2019 15:15    Cardiac Studies   Echo 05/29/2019 1. The left ventricle has low normal systolic function, with an ejection fraction of 50-55%. The cavity size was normal. There is moderately increased left ventricular wall thickness. Left ventricular diastolic Doppler parameters are consistent with  pseudonormalization. Elevated mean left atrial pressure No evidence of left ventricular regional wall motion abnormalities. 2. The right ventricle has moderately reduced systolic function. The cavity was mildly enlarged. There  is no increase in right ventricular wall thickness. Right ventricular systolic pressure is moderately elevated with an estimated pressure of 56.7  mmHg. 3. Left atrial size was severely dilated. 4. The aortic valve is tricuspid. 5. The aorta is normal unless otherwise noted. 6. Mildly dilated pulmonary artery. 7. The inferior vena cava was dilated in size with <50% respiratory variability. 8. The interatrial septum was not well visualized.  Patient Profile     58 y.o. male with a history of CAD s/p 4v bypass 2015, ESRD on HD 4 days per week, HTN, HLD, HFpEF (50-55%), anemia of chronic dz, anxiety, arthritis, sleep apnea, ongoing tobacco and alcohol abuse, and who was admitted 9/14 with HTN, hypoxia, intermittent, CP, and slurred speech and developed Afib wit RVR 9/16 during HD.   Assessment & Plan    Afib with RVR --Now in SR at time of stress testing s/p starting on amiodarone yesterday. Will need to review telemetry feed once patient is back upstairs to see if stored the time at which the patient converted from Atrial fibrillation to SR/ST. --Reports can feel his heart racing and sometimes feels palpitations. No chest pain.  --Remains on heparin. Pending stress test results  - will plan to transition to Eliquis if he will not require further ischemic evaluation after stress testing. [id number] score of at least  5.  --Continue BB, amiodarone 400 BID x4 days, then 200 BID with outpt follow up --No plan for outpatient cardioversion as long as he remains in SR.  PNA --Abx. Per IM.  CAD/unstable angina / elevated HS Tn --No current chest pain prior to stress test. HS Tn as above and minimally elevated, flat trending. Echo 9/15 with normal LV function. Currently undergoing stress test. Pending official results, can transition to Eliquis if low risk study as above. Continue ASA, ARB, BB, statin, SL nitro as needed for CP.   HFpEF --Blood pressures remain elevated prior to stress test ad s/p switching from metoprolol to Coreg. Recommend uptitrate and adjust antihypertensives as needed for further BP control. Continue lasix, ARB, hydralazine, clonidine, Coreg, and amlodipine. Further volume management per nephrology.   HTN --Medication management as above.   HLD --Continue statin therapy.  Tobacco abuse, Alcohol abuse --Cessation advised. Nicotine patch.   ESRD with anemia of chronic dz --Dialysis M, W, F, Saturday as outpatient. Previous session cut short due to Afib. EPO. Per nephrology.    For questions or updates, please contact [place] Please consult [email or web address] for contact info under        Signed, Arvil Chaco, PA-C  06/01/2019, 9:27 AM

## 2019-06-01 NOTE — Progress Notes (Signed)
1930 - Pt c/o headache.  He has not received his BP meds today due to cardiac stress test and dialysis.   1800 meds administered at 1930.  Pt blood pressure decreasing.  Continue to monitor.

## 2019-06-02 LAB — CBC
HCT: 26.3 % — ABNORMAL LOW (ref 39.0–52.0)
Hemoglobin: 9 g/dL — ABNORMAL LOW (ref 13.0–17.0)
MCH: 34.2 pg — ABNORMAL HIGH (ref 26.0–34.0)
MCHC: 34.2 g/dL (ref 30.0–36.0)
MCV: 100 fL (ref 80.0–100.0)
Platelets: 170 10*3/uL (ref 150–400)
RBC: 2.63 MIL/uL — ABNORMAL LOW (ref 4.22–5.81)
RDW: 13.5 % (ref 11.5–15.5)
WBC: 7.6 10*3/uL (ref 4.0–10.5)
nRBC: 0 % (ref 0.0–0.2)

## 2019-06-02 LAB — MAGNESIUM: Magnesium: 1.9 mg/dL (ref 1.7–2.4)

## 2019-06-02 LAB — BASIC METABOLIC PANEL
Anion gap: 11 (ref 5–15)
BUN: 32 mg/dL — ABNORMAL HIGH (ref 6–20)
CO2: 26 mmol/L (ref 22–32)
Calcium: 8.7 mg/dL — ABNORMAL LOW (ref 8.9–10.3)
Chloride: 98 mmol/L (ref 98–111)
Creatinine, Ser: 6.9 mg/dL — ABNORMAL HIGH (ref 0.61–1.24)
GFR calc Af Amer: 9 mL/min — ABNORMAL LOW (ref >60)
GFR calc non Af Amer: 8 mL/min — ABNORMAL LOW (ref >60)
Glucose, Bld: 93 mg/dL (ref 70–99)
Potassium: 4.2 mmol/L (ref 3.5–5.1)
Sodium: 135 mmol/L (ref 135–145)

## 2019-06-02 LAB — HEPARIN LEVEL (UNFRACTIONATED): Heparin Unfractionated: 0.45 IU/mL (ref 0.30–0.70)

## 2019-06-02 LAB — HEMOGLOBIN: Hemoglobin: 8.5 g/dL — ABNORMAL LOW (ref 13.0–17.0)

## 2019-06-02 MED ORDER — HYDRALAZINE HCL 50 MG PO TABS
100.0000 mg | ORAL_TABLET | Freq: Four times a day (QID) | ORAL | Status: DC
  Administered 2019-06-02 – 2019-06-05 (×10): 100 mg via ORAL
  Filled 2019-06-02 (×11): qty 2

## 2019-06-02 MED ORDER — CLONIDINE HCL 0.1 MG PO TABS
0.2000 mg | ORAL_TABLET | Freq: Three times a day (TID) | ORAL | Status: DC
  Administered 2019-06-02 – 2019-06-05 (×10): 0.2 mg via ORAL
  Filled 2019-06-02 (×11): qty 2

## 2019-06-02 MED ORDER — ONDANSETRON HCL 4 MG/2ML IJ SOLN
4.0000 mg | Freq: Four times a day (QID) | INTRAMUSCULAR | Status: DC | PRN
  Administered 2019-06-02: 4 mg via INTRAVENOUS
  Filled 2019-06-02: qty 2

## 2019-06-02 NOTE — Progress Notes (Signed)
Transferred to Room 206, notified [place], son.  Sent phone and wallet with patient.

## 2019-06-02 NOTE — Progress Notes (Signed)
[facility], [place] 06/02/19  Subjective:   LOS: 5 09/18 0701 - 09/19 0700 In: 645.3 [P.O.:340; I.V.:305.3] Out: 3023    Patient had tachycardia and chest pain at dialysis on Wed leading to rapid response. Dx with A Fib with RVR Underwent cardiac stress test on Friday Feels well this morning Denies SOB Currently on room air States he needs help with shower Some oozing overnight from AV access  Objective:  Vital signs in last 24 hours:  Temp:  [97.4 F (36.3 C)-98.4 F (36.9 C)] 98.1 F (36.7 C) (09/19 [id number]) Pulse Rate:  [54-88] 54 (09/19 0826) Resp:  [1-26] 20 (09/19 [id number]) BP: (135-218)/(56-119) 159/72 (09/19 0826) SpO2:  [92 %-99 %] 98 % (09/19 0633) Weight:  [74.3 kg] 74.3 kg (09/19 0631)  Weight change:  Filed Weights   05/30/19 0830 05/30/19 0843 06/02/19 0631  Weight: 77.5 kg 77.5 kg 74.3 kg    Intake/Output:    Intake/Output Summary (Last 24 hours) at 06/02/2019 1155 Last data filed at 06/02/2019 0800 Gross per 24 hour  Intake 695.78 ml  Output 3023 ml  Net -2327.22 ml    Gen:   Alert, cooperative, no distress, appears older than stated age Head:   Normocephalic, without obvious abnormality, atraumatic Eyes/ENT:  conjunctiva clear,  moist oral mucus membranes, periorbital edema Neck:  Supple,  thyroid: not enlarged, no JVD Lungs:   Clear to auscultation today, room air Heart:   Regular rhythm, no, rub or gallop Abdomen:   Soft, non-tender,   Extremities: no cyanosis or edema Neurologic: Alert and oriented, able to answer questions appropriately Left arm AVG, good thrill    Basic Metabolic Panel:  Recent Labs  Lab 05/28/19 1452 05/29/19 0340 05/30/19 1249 05/31/19 0446 06/01/19 0547 06/02/19 0424  NA  --  136 135 133* 133* 135  K  --  5.1 4.4 4.7 4.8 4.2  CL  --  96* 96* 96* 97* 98  CO2  --  28 27 24 24 26   GLUCOSE  --  121* 123* 102* 97 93  BUN  --  37* 40* 51* 61* 32*  CREATININE  --  6.20* 6.98* 8.50*  10.17* 6.90*  CALCIUM  --  9.3 9.1 8.5* 8.5* 8.7*  MG  --   --  2.0 2.0  --  1.9  PHOS 3.9  --  3.7 4.0  --   --      CBC: Recent Labs  Lab 05/28/19 0709 05/29/19 0340 05/30/19 1249 05/31/19 0446 06/01/19 0547 06/02/19 0424  WBC 11.5* 8.4 9.1 9.2 7.9 7.6  NEUTROABS 9.7*  --   --  5.7  --   --   HGB 11.0* 9.6* 10.2* 9.9* 8.9* 9.0*  HCT 32.3* 28.0* 29.7* 28.0* 26.0* 26.3*  MCV 102.5* 101.1* 101.4* 98.6 101.6* 100.0  PLT 211 186 191 173 161 170      Lab Results  Component Value Date   HEPBSAG NEGATIVE 11/30/2013      Microbiology:  Recent Results (from the past 240 hour(s))  SARS Coronavirus 2 [facility] order, Performed in [facility] hospital lab) Nasopharyngeal Nasopharyngeal Swab     Status: None   Collection Time: 05/28/19  7:20 AM   Specimen: Nasopharyngeal Swab  Result Value Ref Range Status   SARS Coronavirus 2 NEGATIVE NEGATIVE Final    Comment: (NOTE) If result is NEGATIVE SARS-CoV-2 target nucleic acids are NOT DETECTED. The SARS-CoV-2 RNA is generally detectable in upper and lower  respiratory specimens during the acute  phase of infection. The lowest  concentration of SARS-CoV-2 viral copies this assay can detect is 250  copies / mL. A negative result does not preclude SARS-CoV-2 infection  and should not be used as the sole basis for treatment or other  patient management decisions.  A negative result may occur with  improper specimen collection / handling, submission of specimen other  than nasopharyngeal swab, presence of viral mutation(s) within the  areas targeted by this assay, and inadequate number of viral copies  (<250 copies / mL). A negative result must be combined with clinical  observations, patient history, and epidemiological information. If result is POSITIVE SARS-CoV-2 target nucleic acids are DETECTED. The SARS-CoV-2 RNA is generally detectable in upper and lower  respiratory specimens dur ing the acute phase of infection.  Positive   results are indicative of active infection with SARS-CoV-2.  Clinical  correlation with patient history and other diagnostic information is  necessary to determine patient infection status.  Positive results do  not rule out bacterial infection or co-infection with other viruses. If result is PRESUMPTIVE POSTIVE SARS-CoV-2 nucleic acids MAY BE PRESENT.   A presumptive positive result was obtained on the submitted specimen  and confirmed on repeat testing.  While 2019 novel coronavirus  (SARS-CoV-2) nucleic acids may be present in the submitted sample  additional confirmatory testing may be necessary for epidemiological  and / or clinical management purposes  to differentiate between  SARS-CoV-2 and other Sarbecovirus currently known to infect humans.  If clinically indicated additional testing with an alternate test  methodology [phone]) is advised. The SARS-CoV-2 RNA is generally  detectable in upper and lower respiratory sp ecimens during the acute  phase of infection. The expected result is Negative. Fact Sheet for Patients:  StrictlyIdeas.no Fact Sheet for Healthcare Providers: BankingDealers.co.za This test is not yet approved or cleared by the [place] FDA and has been authorized for detection and/or diagnosis of SARS-CoV-2 by FDA under an Emergency Use Authorization (EUA).  This EUA will remain in effect (meaning this test can be used) for the duration of the COVID-19 declaration under Section 564(b)(1) of the Act, 21 U.S.C. section 360bbb-3(b)(1), unless the authorization is terminated or revoked sooner. Performed at [facility], [address] [place], [place] [address]   MRSA PCR Screening     Status: None   Collection Time: 05/28/19 10:36 AM   Specimen: Nasal Mucosa; Nasopharyngeal  Result Value Ref Range Status   MRSA by PCR NEGATIVE NEGATIVE Final    Comment:        The GeneXpert MRSA Assay  (FDA approved for NASAL specimens only), is one component of a comprehensive MRSA colonization surveillance program. It is not intended to diagnose MRSA infection nor to guide or monitor treatment for MRSA infections. Performed at [facility], [address] [place], [place] [address]     Coagulation Studies: No results for input(s): LABPROT, INR in the last 72 hours.  Urinalysis: No results for input(s): COLORURINE, LABSPEC, PHURINE, GLUCOSEU, HGBUR, BILIRUBINUR, KETONESUR, PROTEINUR, UROBILINOGEN, NITRITE, LEUKOCYTESUR in the last 72 hours.  Invalid input(s): APPERANCEUR    Imaging: US Carotid Bilateral  Result Date: 05/31/2019 CLINICAL DATA:  58 year old male with a history of chest pain EXAM: BILATERAL CAROTID DUPLEX ULTRASOUND TECHNIQUE: Pearline Cables scale imaging, color Doppler and duplex ultrasound were performed of bilateral carotid and vertebral arteries in the neck. COMPARISON:  No prior duplex FINDINGS: Criteria: Quantification of carotid stenosis is based on velocity parameters that correlate the residual  internal carotid diameter with NASCET-based stenosis levels, using the diameter of the distal internal carotid lumen as the denominator for stenosis measurement. The following velocity measurements were obtained: RIGHT ICA:  Systolic 99991111 cm/sec, Diastolic 24 cm/sec CCA:  81 cm/sec SYSTOLIC ICA/CCA RATIO:  1.4 ECA:  87 cm/sec LEFT ICA:  Systolic 96 cm/sec, Diastolic 22 cm/sec CCA:  83 cm/sec SYSTOLIC ICA/CCA RATIO:  1.2 ECA:  135 cm/sec Right Brachial SBP: Not acquired Left Brachial SBP: Not acquired RIGHT CAROTID ARTERY: No significant calcifications of the right common carotid artery. Intermediate waveform maintained. Moderate heterogeneous and partially calcified plaque at the right carotid bifurcation. No significant lumen shadowing. Low resistance waveform of the right ICA. Tortuosity RIGHT VERTEBRAL ARTERY: Antegrade flow with low resistance waveform. LEFT CAROTID  ARTERY: No significant calcifications of the left common carotid artery. Intermediate waveform maintained. Moderate heterogeneous and partially calcified plaque at the left carotid bifurcation. No significant lumen shadowing. Low resistance waveform of the left ICA. Tortuosity LEFT VERTEBRAL ARTERY:  Antegrade flow with low resistance waveform. Additional: Nonocclusive thrombus of the right internal jugular vein IMPRESSION: Color duplex indicates moderate heterogeneous and calcified plaque, with no hemodynamically significant stenosis by duplex criteria in the extracranial cerebrovascular circulation. Incidental note made of nonocclusive thrombus of the right IJ vein. Correlation with any history of central venous catheter may be useful. Signed, Dulcy Fanny. Dellia Nims, RPVI [facility] Inter[facility] Radiology Specialists [facility] Rad[facility] Electronically Signed   By: Corrie Mckusick D.O.   On: 05/31/2019 15:15   Nm Myocar Multi W/spect W/wall Motion / Ef  Result Date: 06/01/2019 Pharmacological myocardial perfusion imaging study with no significant ischemia Small mild fixed apical perfusion defect, unable to exclude attenuation artifact vs small prior MI. Normal wall motion, EF estimated at 40% (decreased EF secondary to GI uptake artifact) No EKG changes concerning for ischemia at peak stress or in recovery. Baseline EKG with ST abn in V5 and V6, more pronounced in V4 to V6 with lexiscan infusion Low risk scan Signed, Esmond Plants, MD, Ph.D [facility]     Medications:   . sodium chloride Stopped (06/02/19 0700)  . heparin 1,800 Units/hr (06/02/19 0800)   . amiodarone  400 mg Oral BID  . amLODipine  10 mg Oral Daily  . aspirin EC  81 mg Oral Daily  . atorvastatin  40 mg Oral q1800  . budesonide  0.5 mg Nebulization Q12H  . calcium carbonate  1 tablet Oral BID WC  . carvedilol  25 mg Oral BID WC  . Chlorhexidine Gluconate Cloth  6 each Topical Q0600  . cinacalcet  30 mg Oral Q supper  .  cloNIDine  0.2 mg Oral TID  . epoetin (EPOGEN/PROCRIT) injection  4,000 Units Intravenous Q M,W,F-HD  . furosemide  80 mg Oral BID  . gabapentin  100 mg Oral TID  . hydrALAZINE  100 mg Oral Q6H  . influenza vac split quadrivalent PF  0.5 mL Intramuscular Tomorrow-1000  . irbesartan  300 mg Oral QHS  . loratadine  10 mg Oral Daily  . multivitamin  1 tablet Oral Daily  . nicotine  21 mg Transdermal Daily  . sevelamer carbonate  2,400 mg Oral Daily  . tiotropium  18 mcg Inhalation QPM  . zolpidem  10 mg Oral QHS   sodium chloride, acetaminophen, albuterol, diphenhydrAMINE, docusate sodium, hydrALAZINE, metoprolol tartrate, nitroGLYCERIN  Assessment/ Plan:  58 y.o. male with  ESRD, CAD/ CABG 2015, CHF, sleep apnea, alcohol abuse, current smoker, history of chronic lacunar  infarcts within left frontal lobe white matter and left basal ganglia  [facility] MWF-1// TW 70kg   Active Problems:   HCAP (healthcare-associated pneumonia)   #. ESRD with hyperkalemia Next hemodialysis will be scheduled on Monday  #. Anemia of CKD  Lab Results  Component Value Date   HGB 9.0 (L) 06/02/2019  low dose EPO with HD   #. SHPTH  No results found for: PTH Lab Results  Component Value Date   PHOS 4.0 05/31/2019  follow low phos diet  #. HTN  Isolated systolic Continue home medications Multiple antihypertensives.  Blood pressure control is a little better -Could also consider isosorbide as outpatient  # Pneumonia - Antibiotics as per Primary team - COVID-19 test neg  # A Fib with RVR Stress test negative for ischemia Converted to sinus with amiodarone Evaluated by cardiologist, Dr. Gwenlyn Perking on Eliquis 5 mg twice a day.  Agree.    LOS: [place] 9/19/202011:55 AM  [facility] [place], [place]

## 2019-06-02 NOTE — Progress Notes (Signed)
[place] at [place] NAME: Daryl Weeks    MR#:  [id number]  DATE OF BIRTH:  1961/05/29  SUBJECTIVE:  CHIEF COMPLAINT:   Chief Complaint  Patient presents with  . Shortness of Breath  Denies any shortness of breath today.  Had hemodialysis yesterday Patient gets hemodialysis Monday Wednesday Friday according to the scheduled REVIEW OF SYSTEMS:  CONSTITUTIONAL: No fever, fatigue or weakness.  EYES: No blurred or double vision.  EARS, NOSE, AND THROAT: No tinnitus or ear pain.  RESPIRATORY: have cough, shortness of breath with exertion, no wheezing or hemoptysis.  CARDIOVASCULAR: No chest pain, orthopnea, edema.  GASTROINTESTINAL: No nausea, vomiting, diarrhea or abdominal pain.  GENITOURINARY: No dysuria, hematuria.  ENDOCRINE: No polyuria, nocturia,  HEMATOLOGY: No anemia, easy bruising or bleeding SKIN: No rash or lesion. MUSCULOSKELETAL: No joint pain or arthritis.   NEUROLOGIC: No tingling, numbness, weakness.  PSYCHIATRY: No anxiety or depression.   ROS  DRUG ALLERGIES:   Allergies  Allergen Reactions  . Lisinopril     VITALS:  Blood pressure (!) 159/72, pulse (!) 54, temperature 98.1 F (36.7 C), temperature source Oral, resp. rate 20, height 5\' 7"  (1.702 m), weight 74.3 kg, SpO2 98 %.  PHYSICAL EXAMINATION:  GENERAL:  58 y.o.-year-old patient lying in the bed with no acute distress.  EYES: Pupils equal, round, reactive to light and accommodation. No scleral icterus. Extraocular muscles intact.  HEENT: Head atraumatic, normocephalic. Oropharynx and nasopharynx clear.  NECK:  Supple, no jugular venous distention. No thyroid enlargement, no tenderness.  LUNGS: Normal breath sounds bilaterally, no wheezing, some crepitation. No use of accessory muscles of respiration.  CARDIOVASCULAR: S1, S2 normal. No murmurs, rubs, or gallops.  ABDOMEN: Soft, nontender, nondistended. Bowel sounds present. EXTREMITIES: No pedal edema,  cyanosis, or clubbing.  NEUROLOGIC: Cranial nerves II through XII are intact. Muscle strength 5/5 in all extremities. Sensation intact. Gait not checked.  PSYCHIATRIC: The patient is alert and oriented x 3.  SKIN: No obvious rash, lesion, or ulcer.   Physical Exam LABORATORY PANEL:   CBC Recent Labs  Lab 06/02/19 0424  WBC 7.6  HGB 9.0*  HCT 26.3*  PLT 170   ------------------------------------------------------------------------------------------------------------------  Chemistries  Recent Labs  Lab 05/30/19 1249  06/02/19 0424  NA 135   < > 135  K 4.4   < > 4.2  CL 96*   < > 98  CO2 27   < > 26  GLUCOSE 123*   < > 93  BUN 40*   < > 32*  CREATININE 6.98*   < > 6.90*  CALCIUM 9.1   < > 8.7*  MG 2.0   < > 1.9  AST 13*  --   --   ALT 10  --   --   ALKPHOS 115  --   --   BILITOT 1.0  --   --    < > = values in this interval not displayed.   ------------------------------------------------------------------------------------------------------------------  Cardiac Enzymes No results for input(s): TROPONINI in the last 168 hours. ------------------------------------------------------------------------------------------------------------------  RADIOLOGY:  US Carotid Bilateral  Result Date: 05/31/2019 CLINICAL DATA:  58 year old male with a history of chest pain EXAM: BILATERAL CAROTID DUPLEX ULTRASOUND TECHNIQUE: Pearline Cables scale imaging, color Doppler and duplex ultrasound were performed of bilateral carotid and vertebral arteries in the neck. COMPARISON:  No prior duplex FINDINGS: Criteria: Quantification of carotid stenosis is based on velocity parameters that correlate the residual internal carotid diameter with NASCET-based stenosis levels,  using the diameter of the distal internal carotid lumen as the denominator for stenosis measurement. The following velocity measurements were obtained: RIGHT ICA:  Systolic 99991111 cm/sec, Diastolic 24 cm/sec CCA:  81 cm/sec SYSTOLIC ICA/CCA  RATIO:  1.4 ECA:  87 cm/sec LEFT ICA:  Systolic 96 cm/sec, Diastolic 22 cm/sec CCA:  83 cm/sec SYSTOLIC ICA/CCA RATIO:  1.2 ECA:  135 cm/sec Right Brachial SBP: Not acquired Left Brachial SBP: Not acquired RIGHT CAROTID ARTERY: No significant calcifications of the right common carotid artery. Intermediate waveform maintained. Moderate heterogeneous and partially calcified plaque at the right carotid bifurcation. No significant lumen shadowing. Low resistance waveform of the right ICA. Tortuosity RIGHT VERTEBRAL ARTERY: Antegrade flow with low resistance waveform. LEFT CAROTID ARTERY: No significant calcifications of the left common carotid artery. Intermediate waveform maintained. Moderate heterogeneous and partially calcified plaque at the left carotid bifurcation. No significant lumen shadowing. Low resistance waveform of the left ICA. Tortuosity LEFT VERTEBRAL ARTERY:  Antegrade flow with low resistance waveform. Additional: Nonocclusive thrombus of the right internal jugular vein IMPRESSION: Color duplex indicates moderate heterogeneous and calcified plaque, with no hemodynamically significant stenosis by duplex criteria in the extracranial cerebrovascular circulation. Incidental note made of nonocclusive thrombus of the right IJ vein. Correlation with any history of central venous catheter may be useful. Signed, Dulcy Fanny. Dellia Nims, RPVI [facility] Electronically Signed   By: Corrie Mckusick D.O.   On: 05/31/2019 15:15   Nm Myocar Multi W/spect W/wall Motion / Ef  Result Date: 06/01/2019 Pharmacological myocardial perfusion imaging study with no significant ischemia Small mild fixed apical perfusion defect, unable to exclude attenuation artifact vs small prior MI. Normal wall motion, EF estimated at 40% (decreased EF secondary to GI uptake artifact) No EKG changes concerning for ischemia at peak stress or in recovery. Baseline EKG with ST abn in V5  and V6, more pronounced in V4 to V6 with lexiscan infusion Low risk scan Signed, Esmond Plants, MD, Ph.D [facility]    ASSESSMENT AND PLAN:   Active Problems:   HCAP (healthcare-associated pneumonia)  * Atrial fibrillation with rapid ventricular response -new onset, now converted to sinus rhythm -2D echo shows EF of 50 to 55% - Continue Coreg for rate control - on heparin GTT. -cardiology considering transition to NOAC --Continue BB, amiodarone.  -Stress test on 06/01/2019-myocardial perfusion imaging study with no significant ischemia.  Small mild fixed apical perfusion defect unable to exclude attenuation artifact versus small prior MI  * sepsis due to Pneumonia   Cont IV Abx.   MRSA and COVID test negative.   * Ac respi failure- hypoxia   Supplemental oxygen.    * Speech difficulty -could be TIA, it is resolved   Stat CT head as he had 2 falls- negative.  MRI negative. -Continue ASA and atorvastatin.  * Uncontrolled Htn   Cont home meds, Give Hydralazine IV PRN  * ESRD On HD  getting dialyzed per nephrology.  Monday Wednesday Friday -Dialysis scheduled for for Monday appreciate nephrology recommendations  * Hyperkalemia Resolved after dialysis  * Active smoker  Counselled to Quit for 4 min. by admitting doctor      All the records are reviewed and case discussed with Care Management/Social Workerr. Management plans discussed with the patient, nursing, ICU team and they are in agreement.  CODE STATUS: full.  TOTAL TIME TAKING CARE OF THIS PATIENT: 25 minutes.     POSSIBLE D/C IN 2-3 DAYS, DEPENDING ON CLINICAL CONDITION.  Nicholes Mango M.D on 06/02/2019   Between 7am to 6pm - Pager - [phone]  After 6pm go to [email or web address] - password [email or web address] [facility]  Office  [phone]  CC: Primary care physician; Mikey College, NP  Note: This dictation was prepared with Dragon dictation along with smaller phrase technology.  Any transcriptional errors that result from this process are unintentional.

## 2019-06-02 NOTE — Progress Notes (Signed)
ANTICOAGULATION CONSULT NOTE -  Pharmacy Consult for Heparin  Indication: atrial fibrillation  Allergies  Allergen Reactions  . Lisinopril     Patient Measurements: Height: 5\' 7"  (170.2 cm) Weight: 170 lb 13.7 oz (77.5 kg) IBW/kg (Calculated) : 66.1 Heparin Dosing Weight:  70.3 kg   Vital Signs: Temp: 97.4 F (36.3 C) (09/19 0200) Temp Source: Axillary (09/19 0200) BP: 173/109 (09/19 0500) Pulse Rate: 71 (09/19 0500)  Labs: Recent Labs    05/30/19 1119  05/30/19 1249  05/31/19 0446 05/31/19 0853  06/01/19 0547 06/01/19 1046 06/01/19 1951 06/02/19 0424  HGB  --    < > 10.2*  --  9.9*  --   --  8.9*  --   --  9.0*  HCT  --    < > 29.7*  --  28.0*  --   --  26.0*  --   --  26.3*  PLT  --    < > 191  --  173  --   --  161  --   --  170  APTT 29  --   --   --   --   --   --   --   --   --   --   LABPROT 13.7  --   --   --   --   --   --   --   --   --   --   INR 1.1  --   --   --   --   --   --   --   --   --   --   HEPARINUNFRC  --   --   --    < >  --   --    < >  --  0.11* 0.47 0.45  CREATININE  --    < > 6.98*  --  8.50*  --   --  10.17*  --   --  6.90*  TROPONINIHS 29*  --  33*  --   --  42*  --   --   --   --   --    < > = values in this interval not displayed.    Estimated Creatinine Clearance: 11.2 mL/min (A) (by C-G formula based on SCr of 6.9 mg/dL (H)).   Medical History: Past Medical History:  Diagnosis Date  . (HFpEF) heart failure with preserved ejection fraction ([place])    a. 05/2019 Echo: EF 50-55%, diast dysfxn, RVSP 56.34mmHg, Sev dil LA. Mildly dil PA.  [name] Anemia   . Anxiety   . Arthritis   . Colon polyps   . COPD (chronic obstructive pulmonary disease) ([place])   . Coronary artery disease    a. 10/2013 NSTEMI/Cath: Severe 3VD-->CABG x 4 @ [facility] 01/2014 (LIMA->LAD, VG->OM1->OM2, VG->RCA); b. 11/2014 MV: No isch/infarct.  [name] ESRD (end stage renal disease) ([place])    a. MWFSat HD  . ETOH abuse    a. 2 beers/night.  [name] GERD (gastroesophageal reflux disease)    . History of pneumonia   . Hyperlipidemia   . Hypertension   . IgA nephropathy   . Lower GI bleed    a. Due to colon polyps. Status post resection of 14 polyps  . Non-ST elevation MI (NSTEMI) ([place])   . Rheumatoid arthritis ([place])   . Shortness of breath   . Sleep apnea   . Tobacco abuse     Medications:  Heparin 5000 units Q8h (  d/c, last dose 9/16 @0422 ) No anticoagulation prior to admission  Assessment: 58 y/o M with hx of CHF, ESRD on HD, CAD, HLD, and HTN admitted 9/14 with acute respiratory failure secondary to PNA. This morning patient complaining of chest tightness and burning while on HD and found to be in Afib with RVR. Now in ICU on rate control with carvedilol and rhythm control with amiodarone. Per cardiology plan is to transition to a apixaban once it is clear that no intervention is required. Patient with CHA2DS2VASc of 5 and will required continued anticoagulation.   Goal of Therapy:  Heparin level 0.3-0.7 units/ml Monitor platelets by anticoagulation protocol: Yes   Plan:  Confirmed with RN, no known stoppages int therapy. Will bolus 2300 units and increase rate to 1800 units/hr. Will obtain anti-Xa level at 2000. CBC with am labs.   9/18:  HL @ 1951 = 0.47 9/19 @ 0424 HL = 0.45, therapeutic x 2.  CBC appears stable.    Will continue pt on current rate and recheck HL in am. CBC daily while on Heparin per protocol   Hart Robinsons A 06/02/2019,5:24 AM

## 2019-06-03 LAB — CBC
HCT: 24.7 % — ABNORMAL LOW (ref 39.0–52.0)
Hemoglobin: 8.6 g/dL — ABNORMAL LOW (ref 13.0–17.0)
MCH: 35.1 pg — ABNORMAL HIGH (ref 26.0–34.0)
MCHC: 34.8 g/dL (ref 30.0–36.0)
MCV: 100.8 fL — ABNORMAL HIGH (ref 80.0–100.0)
Platelets: 169 10*3/uL (ref 150–400)
RBC: 2.45 MIL/uL — ABNORMAL LOW (ref 4.22–5.81)
RDW: 13.8 % (ref 11.5–15.5)
WBC: 7.9 10*3/uL (ref 4.0–10.5)
nRBC: 0 % (ref 0.0–0.2)

## 2019-06-03 LAB — HEMOGLOBIN AND HEMATOCRIT, BLOOD
HCT: 24.7 % — ABNORMAL LOW (ref 39.0–52.0)
Hemoglobin: 8.4 g/dL — ABNORMAL LOW (ref 13.0–17.0)

## 2019-06-03 LAB — SAMPLE TO BLOOD BANK

## 2019-06-03 LAB — HEPARIN LEVEL (UNFRACTIONATED): Heparin Unfractionated: <0.1 IU/mL — ABNORMAL LOW (ref 0.30–0.70)

## 2019-06-03 NOTE — Progress Notes (Signed)
ANTICOAGULATION CONSULT NOTE -  Pharmacy Consult for Heparin  Indication: atrial fibrillation  Allergies  Allergen Reactions  . Lisinopril     Patient Measurements: Height: 5\' 7"  (170.2 cm) Weight: 163 lb 14.4 oz (74.3 kg) IBW/kg (Calculated) : 66.1 Heparin Dosing Weight:  70.3 kg   Vital Signs: Temp: 98.4 F (36.9 C) (09/20 0415) Temp Source: Oral (09/20 0415) BP: 148/75 (09/20 0415) Pulse Rate: 67 (09/20 0415)  Labs: Recent Labs    05/31/19 0853  06/01/19 0547  06/01/19 1951 06/02/19 0424 06/02/19 2230 06/03/19 0431  HGB  --    < > 8.9*  --   --  9.0* 8.5* 8.6*  HCT  --   --  26.0*  --   --  26.3*  --  24.7*  PLT  --   --  161  --   --  170  --  169  HEPARINUNFRC  --    < >  --    < > 0.47 0.45  --  <0.10*  CREATININE  --   --  10.17*  --   --  6.90*  --   --   TROPONINIHS 42*  --   --   --   --   --   --   --    < > = values in this interval not displayed.    Estimated Creatinine Clearance: 11.2 mL/min (A) (by C-G formula based on SCr of 6.9 mg/dL (H)).   Medical History: Past Medical History:  Diagnosis Date  . (HFpEF) heart failure with preserved ejection fraction ([place])    a. 05/2019 Echo: EF 50-55%, diast dysfxn, RVSP 56.64mmHg, Sev dil LA. Mildly dil PA.  [name] Anemia   . Anxiety   . Arthritis   . Colon polyps   . COPD (chronic obstructive pulmonary disease) ([place])   . Coronary artery disease    a. 10/2013 NSTEMI/Cath: Severe 3VD-->CABG x 4 @ [facility] 01/2014 (LIMA->LAD, VG->OM1->OM2, VG->RCA); b. 11/2014 MV: No isch/infarct.  [name] ESRD (end stage renal disease) ([place])    a. MWFSat HD  . ETOH abuse    a. 2 beers/night.  [name] GERD (gastroesophageal reflux disease)   . History of pneumonia   . Hyperlipidemia   . Hypertension   . IgA nephropathy   . Lower GI bleed    a. Due to colon polyps. Status post resection of 14 polyps  . Non-ST elevation MI (NSTEMI) ([place])   . Rheumatoid arthritis ([place])   . Shortness of breath   . Sleep apnea   . Tobacco abuse      Medications:  Heparin 5000 units Q8h (d/c, last dose 9/16 @0422 ) No anticoagulation prior to admission  Assessment: 58 y/o M with hx of CHF, ESRD on HD, CAD, HLD, and HTN admitted 9/14 with acute respiratory failure secondary to PNA. This morning patient complaining of chest tightness and burning while on HD and found to be in Afib with RVR. Now in ICU on rate control with carvedilol and rhythm control with amiodarone. Per cardiology plan is to transition to a apixaban once it is clear that no intervention is required. Patient with CHA2DS2VASc of 5 and will required continued anticoagulation.   Goal of Therapy:  Heparin level 0.3-0.7 units/ml Monitor platelets by anticoagulation protocol: Yes   Plan:  Confirmed with RN, no known stoppages int therapy. Will bolus 2300 units and increase rate to 1800 units/hr. Will obtain anti-Xa level at 2000. CBC with am labs.   9/18:  HL @  1951 = 0.47 9/19 @ 0424 HL = 0.45, therapeutic x 2.  CBC appears stable.   9/20 @ 0431 HL = < 0.10, per RN heparin was turned off due to bleeding after d/w MD  Will follow up plan to see if Heparin to be resumed later today.    CBC daily while on Heparin per protocol   Hart Robinsons A 06/03/2019,6:23 AM

## 2019-06-03 NOTE — Plan of Care (Signed)
  Problem: Activity: Goal: Risk for activity intolerance will decrease Outcome: Progressing  Pt ambulating in room.

## 2019-06-03 NOTE — Progress Notes (Signed)
[place] at [place] NAME: Daryl Weeks    MR#:  [id number]  DATE OF BIRTH:  11/17/1960  SUBJECTIVE:  CHIEF COMPLAINT:   Chief Complaint  Patient presents with  . Shortness of Breath  Denies any shortness of breath today.  Had hemodialysis  On Friday Fistula site was bleeding yesterday night , pt is worried Patient gets hemodialysis Monday Wednesday Friday according to the scheduled REVIEW OF SYSTEMS:  CONSTITUTIONAL: No fever, fatigue or weakness.  EYES: No blurred or double vision.  EARS, NOSE, AND THROAT: No tinnitus or ear pain.  RESPIRATORY: have cough, shortness of breath with exertion, no wheezing or hemoptysis.  CARDIOVASCULAR: No chest pain, orthopnea, edema.  GASTROINTESTINAL: No nausea, vomiting, diarrhea or abdominal pain.  GENITOURINARY: No dysuria, hematuria.  ENDOCRINE: No polyuria, nocturia,  HEMATOLOGY: No anemia, easy bruising or bleeding SKIN: No rash or lesion. MUSCULOSKELETAL: No joint pain or arthritis.   NEUROLOGIC: No tingling, numbness, weakness.  PSYCHIATRY: No anxiety or depression.   ROS  DRUG ALLERGIES:   Allergies  Allergen Reactions  . Lisinopril     VITALS:  Blood pressure (!) 148/75, pulse 67, temperature 98.4 F (36.9 C), temperature source Oral, resp. rate 20, height 5\' 7"  (1.702 m), weight 74.3 kg, SpO2 93 %.  PHYSICAL EXAMINATION:  GENERAL:  58 y.o.-year-old patient lying in the bed with no acute distress.  EYES: Pupils equal, round, reactive to light and accommodation. No scleral icterus. Extraocular muscles intact.  HEENT: Head atraumatic, normocephalic. Oropharynx and nasopharynx clear.  NECK:  Supple, no jugular venous distention. No thyroid enlargement, no tenderness.  LUNGS: Normal breath sounds bilaterally, no wheezing, some crepitation. No use of accessory muscles of respiration.  CARDIOVASCULAR: S1, S2 normal. No murmurs, rubs, or gallops.  ABDOMEN: Soft, nontender, nondistended.  Bowel sounds present. EXTREMITIES: No pedal edema, cyanosis, or clubbing.  NEUROLOGIC: Cranial nerves II through XII are intact. Muscle strength 5/5 in all extremities. Sensation intact. Gait not checked.  PSYCHIATRIC: The patient is alert and oriented x 3.  SKIN: No obvious rash, lesion, or ulcer.   Physical Exam LABORATORY PANEL:   CBC Recent Labs  Lab 06/03/19 0431  WBC 7.9  HGB 8.6*  HCT 24.7*  PLT 169   ------------------------------------------------------------------------------------------------------------------  Chemistries  Recent Labs  Lab 05/30/19 1249  06/02/19 0424  NA 135   < > 135  K 4.4   < > 4.2  CL 96*   < > 98  CO2 27   < > 26  GLUCOSE 123*   < > 93  BUN 40*   < > 32*  CREATININE 6.98*   < > 6.90*  CALCIUM 9.1   < > 8.7*  MG 2.0   < > 1.9  AST 13*  --   --   ALT 10  --   --   ALKPHOS 115  --   --   BILITOT 1.0  --   --    < > = values in this interval not displayed.   ------------------------------------------------------------------------------------------------------------------  Cardiac Enzymes No results for input(s): TROPONINI in the last 168 hours. ------------------------------------------------------------------------------------------------------------------  RADIOLOGY:  No results found.  ASSESSMENT AND PLAN:  * Bleeding from fistula site  Check cbc No active bleeding  Vascular consult for possible fistulogram  * Atrial fibrillation with rapid ventricular response -new onset, now converted to sinus rhythm -2D echo shows EF of 50 to 55% - Continue Coreg for rate control - holding  heparin GTT  in view of bleeding from fistula site  - --Continue BB, amiodarone.  -Stress test on 06/01/2019-myocardial perfusion imaging study with no significant ischemia.  Small mild fixed apical perfusion defect unable to exclude attenuation artifact versus small prior MI  * sepsis due to Pneumonia   Cont IV Abx.   MRSA and COVID test negative.    * Ac respi failure- hypoxia   Supplemental oxygen.    * Speech difficulty -could be TIA, it is resolved   Stat CT head as he had 2 falls- negative.  MRI negative. -Continue ASA and atorvastatin.  * Uncontrolled Htn   Cont home meds, Give Hydralazine IV PRN  * ESRD On HD  getting dialyzed per nephrology.  Monday Wednesday Friday -Dialysis scheduled for for Monday appreciate nephrology recommendations  * Hyperkalemia Resolved after dialysis  * Active smoker  Counselled to Quit for 4 min. by admitting doctor      All the records are reviewed and case discussed with Care Management/Social Workerr. Management plans discussed with the patient, nursing, and they are in agreement.  CODE STATUS: full.  TOTAL TIME TAKING CARE OF THIS PATIENT: 35 minutes.     POSSIBLE D/C IN 2-3 DAYS, DEPENDING ON CLINICAL CONDITION.   Nicholes Mango M.D on 06/03/2019   Between 7am to 6pm - Pager - [phone]  After 6pm go to [email or web address] - password [email or web address] [facility]  Office  [phone]  CC: Primary care physician; Mikey College, NP  Note: This dictation was prepared with Dragon dictation along with smaller phrase technology. Any transcriptional errors that result from this process are unintentional.

## 2019-06-03 NOTE — Consult Note (Signed)
Reason for Consult:Malfunctioning LEFT AV fistula Referring Physician: Dr. [name] is an 58 y.o. male.  HPI: Patient well known to service, s/p removal of Perm Cath and  1.   Left radial basilic arteriovenous fistula and Jump graft revision cannulation under ultrasound guidance 2.   Left arm fistulagram including central venogram 3.   Percutaneous transluminal angioplasty of the distal forearm AV fistula to jump graft anastomosis with 7 mm diameter Lutonix drug-coated angioplasty balloon 4.   Percutaneous transluminal angioplasty of the proximal forearm AV fistula to jump graft anastomosis with 7 mm diameter angioplasty balloon  Patient admitted for falls, change in speech and weakness. During HD noted to have chest pain, AFib with RVR, placed on Heparin. Noted extensive bleeding of fistula. Difficult to control. Bleeding has subsided. Currently without complaint.  Past Medical History:  Diagnosis Date  . (HFpEF) heart failure with preserved ejection fraction ([place])    a. 05/2019 Echo: EF 50-55%, diast dysfxn, RVSP 56.21mmHg, Sev dil LA. Mildly dil PA.  [name] Anemia   . Anxiety   . Arthritis   . Colon polyps   . COPD (chronic obstructive pulmonary disease) ([place])   . Coronary artery disease    a. 10/2013 NSTEMI/Cath: Severe 3VD-->CABG x 4 @ [facility] 01/2014 (LIMA->LAD, VG->OM1->OM2, VG->RCA); b. 11/2014 MV: No isch/infarct.  [name] ESRD (end stage renal disease) ([place])    a. MWFSat HD  . ETOH abuse    a. 2 beers/night.  [name] GERD (gastroesophageal reflux disease)   . History of pneumonia   . Hyperlipidemia   . Hypertension   . IgA nephropathy   . Lower GI bleed    a. Due to colon polyps. Status post resection of 14 polyps  . Non-ST elevation MI (NSTEMI) ([place])   . Rheumatoid arthritis ([place])   . Shortness of breath   . Sleep apnea   . Tobacco abuse     Past Surgical History:  Procedure Laterality Date  . A/V FISTULAGRAM Left 10/28/2016   Procedure: A/V Fistulagram;  Surgeon: [name], MD;  Location: [place] [facility];  Service: Cardiovascular;  Laterality: Left;  . A/V FISTULAGRAM Left 12/07/2018   Procedure: A/V FISTULAGRAM;  Surgeon: [name], MD;  Location: [place] [facility];  Service: Cardiovascular;  Laterality: Left;  . A/V SHUNT INTERVENTION N/A 10/28/2016   Procedure: A/V Shunt Intervention;  Surgeon: [name], MD;  Location: [place] [facility];  Service: Cardiovascular;  Laterality: N/A;  . A/V SHUNTOGRAM Left 02/01/2019   Procedure: A/V SHUNTOGRAM;  Surgeon: [name], MD;  Location: [place] [facility];  Service: Cardiovascular;  Laterality: Left;  . AV FISTULA PLACEMENT    . CARDIAC CATHETERIZATION     RCA 90% and calcified mid LAD 80% Stenosis  . CORONARY ARTERY BYPASS GRAFT N/A 11/29/2013   Procedure: CORONARY ARTERY BYPASS GRAFTING (CABG) x 4 using endoscopically harvested right saphenous vein and left internal mammary artery;  Surgeon: [name], MD;  Location: [place] [facility];  Service: Open Heart Surgery;  Laterality: N/A;  . dialysis catheterr  2/15  . DIALYSIS/PERMA CATHETER REMOVAL N/A 02/15/2019   Procedure: DIALYSIS/PERMA CATHETER REMOVAL;  Surgeon: [name], MD;  Location: [place] [facility];  Service: Cardiovascular;  Laterality: N/A;  . INSERTION OF DIALYSIS CATHETER N/A 12/14/2018   Procedure: INSERTION OF DIALYSIS CATHETER ( [place] );  Surgeon: [name], MD;  Location: [facility];  Service: Vascular;  Laterality: N/A;  . INTRAOPERATIVE  TRANSESOPHAGEAL ECHOCARDIOGRAM N/A 11/29/2013   Procedure: INTRAOPERATIVE TRANSESOPHAGEAL ECHOCARDIOGRAM;  Surgeon: [name], MD;  Location: [facility] OR;  Service: Open Heart Surgery;  Laterality: N/A;  . PERIPHERAL VASCULAR CATHETERIZATION N/A 06/12/2015   Procedure: A/V Shuntogram/Fistulagram;  Surgeon: [name], MD;  Location: [place] [facility];  Service: Cardiovascular;  Laterality: N/A;  . PERIPHERAL VASCULAR CATHETERIZATION Left 06/12/2015   Procedure: A/V Shunt Intervention;  Surgeon:  [name], MD;  Location: [place] [facility];  Service: Cardiovascular;  Laterality: Left;  . RENAL BIOPSY Left 14  . REVISON OF ARTERIOVENOUS FISTULA Left 12/14/2018   Procedure: REVISON OF ARTERIOVENOUS FISTULA;  Surgeon: [name], MD;  Location: [facility];  Service: Vascular;  Laterality: Left;    Family History  Problem Relation Age of Onset  . Heart disease Father   . Heart disease Brother   . Healthy Sister   . Stroke Neg Hx     Social History:  reports that he has been smoking cigarettes. He has a 16.00 pack-year smoking history. He has never used smokeless tobacco. He reports current alcohol use of about 14.0 standard drinks of alcohol per week. He reports that he does not use drugs.  Allergies:  Allergies  Allergen Reactions  . Lisinopril     Medications: I have reviewed the patient's current medications.  Results for orders placed or performed during the hospital encounter of 05/28/19 (from the past 48 hour(s))  Heparin level (unfractionated)     Status: None   Collection Time: 06/01/19  7:51 PM  Result Value Ref Range   Heparin Unfractionated 0.47 0.30 - 0.70 IU/mL    Comment: (NOTE) If heparin results are below expected values, and patient dosage has  been confirmed, suggest follow up testing of antithrombin III levels. Performed at [facility], [address] [place], [place] [address]   CBC     Status: Abnormal   Collection Time: 06/02/19  4:24 AM  Result Value Ref Range   WBC 7.6 4.0 - 10.5 K/uL   RBC 2.63 (L) 4.22 - 5.81 MIL/uL   Hemoglobin 9.0 (L) 13.0 - 17.0 g/dL   HCT 26.3 (L) 39.0 - 52.0 %   MCV 100.0 80.0 - 100.0 fL   MCH 34.2 (H) 26.0 - 34.0 pg   MCHC 34.2 30.0 - 36.0 g/dL   RDW 13.5 11.5 - 15.5 %   Platelets 170 150 - 400 K/uL   nRBC 0.0 0.0 - 0.2 %    Comment: Performed at [facility], [address] [place], [place] [id number]  Basic metabolic panel     Status: Abnormal   Collection Time: 06/02/19  4:24 AM  Result  Value Ref Range   Sodium 135 135 - 145 mmol/L   Potassium 4.2 3.5 - 5.1 mmol/L   Chloride 98 98 - 111 mmol/L   CO2 26 22 - 32 mmol/L   Glucose, Bld 93 70 - 99 mg/dL   BUN 32 (H) 6 - 20 mg/dL   Creatinine, Ser 6.90 (H) 0.61 - 1.24 mg/dL   Calcium 8.7 (L) 8.9 - 10.3 mg/dL   GFR calc non Af Amer 8 (L) >60 mL/min   GFR calc Af Amer 9 (L) >60 mL/min   Anion gap 11 5 - 15    Comment: Performed at [facility], [address], [place], [place] [address]  Magnesium     Status: None   Collection Time: 06/02/19  4:24 AM  Result Value Ref  Range   Magnesium 1.9 1.7 - 2.4 mg/dL    Comment: Performed at [facility], [place], [place] [address]  Heparin level (unfractionated)     Status: None   Collection Time: 06/02/19  4:24 AM  Result Value Ref Range   Heparin Unfractionated 0.45 0.30 - 0.70 IU/mL    Comment: (NOTE) If heparin results are below expected values, and patient dosage has  been confirmed, suggest follow up testing of antithrombin III levels. Performed at [facility], [address] [place], [place] [address]   Hemoglobin     Status: Abnormal   Collection Time: 06/02/19 10:30 PM  Result Value Ref Range   Hemoglobin 8.5 (L) 13.0 - 17.0 g/dL    Comment: Performed at [facility], [place], [place] [address]  Heparin level (unfractionated)     Status: Abnormal   Collection Time: 06/03/19  4:31 AM  Result Value Ref Range   Heparin Unfractionated <0.10 (L) 0.30 - 0.70 IU/mL    Comment: RESULT REPEATED AND VERIFIED (NOTE) If heparin results are below expected values, and patient dosage has  been confirmed, suggest follow up testing of antithrombin III levels. Performed at [facility], [address] [place], [place] [address]   CBC     Status: Abnormal   Collection Time: 06/03/19  4:31 AM  Result Value Ref Range   WBC 7.9 4.0 - 10.5 K/uL   RBC 2.45 (L) 4.22 - 5.81 MIL/uL   Hemoglobin 8.6 (L)  13.0 - 17.0 g/dL   HCT 24.7 (L) 39.0 - 52.0 %   MCV 100.8 (H) 80.0 - 100.0 fL   MCH 35.1 (H) 26.0 - 34.0 pg   MCHC 34.8 30.0 - 36.0 g/dL   RDW 13.8 11.5 - 15.5 %   Platelets 169 150 - 400 K/uL   nRBC 0.0 0.0 - 0.2 %    Comment: Performed at [facility], [address], [place], [place] [address]    No results found.  Review of Systems  Constitutional: Positive for malaise/fatigue.  HENT: Negative.   Respiratory: Positive for cough and shortness of breath.   Cardiovascular: Positive for chest pain and palpitations.  Gastrointestinal: Negative for abdominal pain.  Genitourinary: Negative.   Musculoskeletal: Negative.   Skin: Negative.   Neurological: Positive for speech change.   Blood pressure (!) 148/75, pulse 67, temperature 98.4 F (36.9 C), temperature source Oral, resp. rate 20, height 5\' 7"  (1.702 m), weight 74.3 kg, SpO2 93 %. Physical Exam  Nursing note and vitals reviewed. Constitutional: He is oriented to person, place, and time. He appears well-developed and well-nourished.  Neck: Normal range of motion. Neck supple.  Cardiovascular: Normal heart sounds.  +Thrill/Bruit in LEFT AV fistula, hand warm, no bleeding in fistula  Respiratory: Effort normal.  GI: Soft.  Musculoskeletal:        General: No edema.  Neurological: He is alert and oriented to person, place, and time.  Skin: Skin is warm.    Assessment/Plan: Patient with bleeding of Left Radial Basilic AV fistula/Jump graft.  Patient has had interventions in the last few months for stenosis.  Will plan for fistulogram in the morning per Dr. Lucky Cowboy.  NPO after MN.  Daryl Weeks A 06/03/2019, 11:53 AM

## 2019-06-03 NOTE — Plan of Care (Signed)
Patient left arm fistula continued to bleed, patient complained of nausea, and dizziness. New dressing and pressure applied to fistula and MD notified. MD ordered HGB, Zofran and to stop Heparin gtt. Left arm fistula remains free from bleeding. Zofran administered and patient denies nausea. Current hemoglobin is 8.5. Will continue to monitor patient.

## 2019-06-03 NOTE — Progress Notes (Signed)
[facility], [place] 06/03/19  Subjective:   LOS: 6 09/19 0701 - 09/20 0700 In: 314.7 [P.O.:100; I.V.:214.7] Out: 0    Patient had tachycardia and chest pain at dialysis on Wed leading to rapid response. Dx with A Fib with RVR Underwent cardiac stress test on Friday Feels well this morning Denies SOB Currently on room air, able to eat without nausea or vomiting Continued oozing overnight from AV access. Currently bandaged  Objective:  Vital signs in last 24 hours:  Temp:  [97.5 F (36.4 C)-98.4 F (36.9 C)] 98.4 F (36.9 C) (09/20 0415) Pulse Rate:  [61-67] 67 (09/20 0415) Resp:  [16-20] 20 (09/20 0415) BP: (137-180)/(59-87) 148/75 (09/20 0415) SpO2:  [93 %-97 %] 93 % (09/20 0415)  Weight change:  Filed Weights   05/30/19 0830 05/30/19 0843 06/02/19 0631  Weight: 77.5 kg 77.5 kg 74.3 kg    Intake/Output:    Intake/Output Summary (Last 24 hours) at 06/03/2019 0916 Last data filed at 06/03/2019 0415 Gross per 24 hour  Intake 208 ml  Output 0 ml  Net 208 ml    Gen:   Alert, cooperative, no distress, appears older than stated age Head:   Normocephalic, without obvious abnormality, atraumatic Eyes/ENT:  conjunctiva clear,  moist oral mucus membranes, periorbital edema Neck:  Supple,  thyroid: not enlarged, no JVD Lungs:   Clear to auscultation today, room air Heart:   Regular rhythm, no, rub or gallop Abdomen:   Soft, non-tender,   Extremities: no cyanosis or edema Neurologic: Alert and oriented, able to answer questions appropriately Left arm AVG, good thrill    Basic Metabolic Panel:  Recent Labs  Lab 05/28/19 1452 05/29/19 0340 05/30/19 1249 05/31/19 0446 06/01/19 0547 06/02/19 0424  NA  --  136 135 133* 133* 135  K  --  5.1 4.4 4.7 4.8 4.2  CL  --  96* 96* 96* 97* 98  CO2  --  28 27 24 24 26   GLUCOSE  --  121* 123* 102* 97 93  BUN  --  37* 40* 51* 61* 32*  CREATININE  --  6.20* 6.98* 8.50* 10.17* 6.90*  CALCIUM  --   9.3 9.1 8.5* 8.5* 8.7*  MG  --   --  2.0 2.0  --  1.9  PHOS 3.9  --  3.7 4.0  --   --      CBC: Recent Labs  Lab 05/28/19 0709  05/30/19 1249 05/31/19 0446 06/01/19 0547 06/02/19 0424 06/02/19 2230 06/03/19 0431  WBC 11.5*   < > 9.1 9.2 7.9 7.6  --  7.9  NEUTROABS 9.7*  --   --  5.7  --   --   --   --   HGB 11.0*   < > 10.2* 9.9* 8.9* 9.0* 8.5* 8.6*  HCT 32.3*   < > 29.7* 28.0* 26.0* 26.3*  --  24.7*  MCV 102.5*   < > 101.4* 98.6 101.6* 100.0  --  100.8*  PLT 211   < > 191 173 161 170  --  169   < > = values in this interval not displayed.      Lab Results  Component Value Date   HEPBSAG NEGATIVE 11/30/2013      Microbiology:  Recent Results (from the past 240 hour(s))  SARS Coronavirus 2 [facility] order, Performed in [facility] hospital lab) Nasopharyngeal Nasopharyngeal Swab     Status: None   Collection Time: 05/28/19  7:20 AM   Specimen:  Nasopharyngeal Swab  Result Value Ref Range Status   SARS Coronavirus 2 NEGATIVE NEGATIVE Final    Comment: (NOTE) If result is NEGATIVE SARS-CoV-2 target nucleic acids are NOT DETECTED. The SARS-CoV-2 RNA is generally detectable in upper and lower  respiratory specimens during the acute phase of infection. The lowest  concentration of SARS-CoV-2 viral copies this assay can detect is 250  copies / mL. A negative result does not preclude SARS-CoV-2 infection  and should not be used as the sole basis for treatment or other  patient management decisions.  A negative result may occur with  improper specimen collection / handling, submission of specimen other  than nasopharyngeal swab, presence of viral mutation(s) within the  areas targeted by this assay, and inadequate number of viral copies  (<250 copies / mL). A negative result must be combined with clinical  observations, patient history, and epidemiological information. If result is POSITIVE SARS-CoV-2 target nucleic acids are DETECTED. The SARS-CoV-2 RNA is generally  detectable in upper and lower  respiratory specimens dur ing the acute phase of infection.  Positive  results are indicative of active infection with SARS-CoV-2.  Clinical  correlation with patient history and other diagnostic information is  necessary to determine patient infection status.  Positive results do  not rule out bacterial infection or co-infection with other viruses. If result is PRESUMPTIVE POSTIVE SARS-CoV-2 nucleic acids MAY BE PRESENT.   A presumptive positive result was obtained on the submitted specimen  and confirmed on repeat testing.  While 2019 novel coronavirus  (SARS-CoV-2) nucleic acids may be present in the submitted sample  additional confirmatory testing may be necessary for epidemiological  and / or clinical management purposes  to differentiate between  SARS-CoV-2 and other Sarbecovirus currently known to infect humans.  If clinically indicated additional testing with an alternate test  methodology [phone]) is advised. The SARS-CoV-2 RNA is generally  detectable in upper and lower respiratory sp ecimens during the acute  phase of infection. The expected result is Negative. Fact Sheet for Patients:  StrictlyIdeas.no Fact Sheet for Healthcare Providers: BankingDealers.co.za This test is not yet approved or cleared by the [place] FDA and has been authorized for detection and/or diagnosis of SARS-CoV-2 by FDA under an Emergency Use Authorization (EUA).  This EUA will remain in effect (meaning this test can be used) for the duration of the COVID-19 declaration under Section 564(b)(1) of the Act, 21 U.S.C. section 360bbb-3(b)(1), unless the authorization is terminated or revoked sooner. Performed at [facility], [address] [place], [place] [address]   MRSA PCR Screening     Status: None   Collection Time: 05/28/19 10:36 AM   Specimen: Nasal Mucosa; Nasopharyngeal  Result Value Ref Range  Status   MRSA by PCR NEGATIVE NEGATIVE Final    Comment:        The GeneXpert MRSA Assay (FDA approved for NASAL specimens only), is one component of a comprehensive MRSA colonization surveillance program. It is not intended to diagnose MRSA infection nor to guide or monitor treatment for MRSA infections. Performed at [facility], [address] [place], [place] [address]     Coagulation Studies: No results for input(s): LABPROT, INR in the last 72 hours.  Urinalysis: No results for input(s): COLORURINE, LABSPEC, PHURINE, GLUCOSEU, HGBUR, BILIRUBINUR, KETONESUR, PROTEINUR, UROBILINOGEN, NITRITE, LEUKOCYTESUR in the last 72 hours.  Invalid input(s): APPERANCEUR    Imaging: Nm Myocar Multi W/spect W/wall Motion / Ef  Result Date: 06/01/2019 Pharmacological myocardial perfusion  imaging study with no significant ischemia Small mild fixed apical perfusion defect, unable to exclude attenuation artifact vs small prior MI. Normal wall motion, EF estimated at 40% (decreased EF secondary to GI uptake artifact) No EKG changes concerning for ischemia at peak stress or in recovery. Baseline EKG with ST abn in V5 and V6, more pronounced in V4 to V6 with lexiscan infusion Low risk scan Signed, Esmond Plants, MD, Ph.D [facility]     Medications:   . sodium chloride Stopped (06/02/19 0700)   . amiodarone  400 mg Oral BID  . amLODipine  10 mg Oral Daily  . aspirin EC  81 mg Oral Daily  . atorvastatin  40 mg Oral q1800  . budesonide  0.5 mg Nebulization Q12H  . calcium carbonate  1 tablet Oral BID WC  . carvedilol  25 mg Oral BID WC  . Chlorhexidine Gluconate Cloth  6 each Topical Q0600  . cinacalcet  30 mg Oral Q supper  . cloNIDine  0.2 mg Oral TID  . epoetin (EPOGEN/PROCRIT) injection  4,000 Units Intravenous Q M,W,F-HD  . furosemide  80 mg Oral BID  . gabapentin  100 mg Oral TID  . hydrALAZINE  100 mg Oral Q6H  . influenza vac split quadrivalent PF  0.5 mL  Intramuscular Tomorrow-1000  . irbesartan  300 mg Oral QHS  . loratadine  10 mg Oral Daily  . multivitamin  1 tablet Oral Daily  . nicotine  21 mg Transdermal Daily  . sevelamer carbonate  2,400 mg Oral Daily  . tiotropium  18 mcg Inhalation QPM  . zolpidem  10 mg Oral QHS   sodium chloride, acetaminophen, albuterol, diphenhydrAMINE, docusate sodium, hydrALAZINE, metoprolol tartrate, nitroGLYCERIN, ondansetron (ZOFRAN) IV  Assessment/ Plan:  58 y.o. male with  ESRD, CAD/ CABG 2015, CHF, sleep apnea, alcohol abuse, current smoker, history of chronic lacunar infarcts within left frontal lobe white matter and left basal ganglia  [facility] MWF-1// TW 70kg   Active Problems:   HCAP (healthcare-associated pneumonia)   #. ESRD with hyperkalemia Bleeding from AVG. Last HD was Friday Anticoagulation may be contributing Contacted Dr Lorenso Courier from vascular surgery to evaluate Patient may need a fistulogram HD will be scheduled on Monday after the procedure  #. Anemia of CKD  Lab Results  Component Value Date   HGB 8.6 (L) 06/03/2019  low dose EPO with HD   #. SHPTH  No results found for: PTH Lab Results  Component Value Date   PHOS 4.0 05/31/2019  follow low phos diet  #. HTN  Isolated systolic Continue home medications Multiple antihypertensives.  Blood pressure control is better -Could also consider isosorbide as outpatient  # Pneumonia - Antibiotics as per Primary team - COVID-19 test neg  # A Fib with RVR Stress test negative for ischemia Converted to sinus with amiodarone Evaluated by cardiologist, Dr. Rockey Situ consider starting Eliquis 2.5 mg twice a day after angiogram    LOS: [place] 9/20/20209:16 AM  [facility] [place], [place]

## 2019-06-04 ENCOUNTER — Inpatient Hospital Stay
Admission: EM | Disposition: A | Discharge status: Home / Self Care | Payer: Self-pay | Source: Home / Self Care | Attending: Internal Medicine

## 2019-06-04 DIAGNOSIS — T82898A Other specified complication of vascular prosthetic devices, implants and grafts, initial encounter: Secondary | ICD-10-CM

## 2019-06-04 DIAGNOSIS — Z992 Dependence on renal dialysis: Secondary | ICD-10-CM | POA: Diagnosis not present

## 2019-06-04 DIAGNOSIS — N186 End stage renal disease: Secondary | ICD-10-CM

## 2019-06-04 HISTORY — PX: A/V FISTULAGRAM: CATH118298

## 2019-06-04 LAB — CBC
HCT: 24.2 % — ABNORMAL LOW (ref 39.0–52.0)
Hemoglobin: 8.2 g/dL — ABNORMAL LOW (ref 13.0–17.0)
MCH: 34 pg (ref 26.0–34.0)
MCHC: 33.9 g/dL (ref 30.0–36.0)
MCV: 100.4 fL — ABNORMAL HIGH (ref 80.0–100.0)
Platelets: 163 10*3/uL (ref 150–400)
RBC: 2.41 MIL/uL — ABNORMAL LOW (ref 4.22–5.81)
RDW: 13.5 % (ref 11.5–15.5)
WBC: 7.6 10*3/uL (ref 4.0–10.5)
nRBC: 0 % (ref 0.0–0.2)

## 2019-06-04 SURGERY — A/V FISTULAGRAM
Anesthesia: Moderate Sedation

## 2019-06-04 MED ORDER — MIDAZOLAM HCL 5 MG/5ML IJ SOLN
INTRAMUSCULAR | Status: AC
  Filled 2019-06-04: qty 5

## 2019-06-04 MED ORDER — MIDAZOLAM HCL 2 MG/2ML IJ SOLN
INTRAMUSCULAR | Status: DC | PRN
  Administered 2019-06-04: 2 mg via INTRAVENOUS

## 2019-06-04 MED ORDER — HEPARIN SODIUM (PORCINE) 1000 UNIT/ML IJ SOLN
INTRAMUSCULAR | Status: AC
  Filled 2019-06-04: qty 1

## 2019-06-04 MED ORDER — HEPARIN SODIUM (PORCINE) 1000 UNIT/ML IJ SOLN
INTRAMUSCULAR | Status: DC | PRN
  Administered 2019-06-04: 3000 [IU] via INTRAVENOUS

## 2019-06-04 MED ORDER — FENTANYL CITRATE (PF) 100 MCG/2ML IJ SOLN
INTRAMUSCULAR | Status: DC | PRN
  Administered 2019-06-04: 50 ug via INTRAVENOUS

## 2019-06-04 MED ORDER — FENTANYL CITRATE (PF) 100 MCG/2ML IJ SOLN
INTRAMUSCULAR | Status: AC
  Filled 2019-06-04: qty 2

## 2019-06-04 MED ORDER — IODIXANOL 320 MG/ML IV SOLN
INTRAVENOUS | Status: DC | PRN
  Administered 2019-06-04: 15:00:00 40 mL via INTRAVENOUS

## 2019-06-04 SURGICAL SUPPLY — 10 items
BALLN LUTONIX DCB 7X60X130 (BALLOONS) ×3
BALLOON LUTONIX DCB 7X60X130 (BALLOONS) IMPLANT
CANNULA 5F STIFF (CANNULA) ×2 IMPLANT
COVER PROBE U/S 5X48 (MISCELLANEOUS) ×2 IMPLANT
DEVICE PRESTO INFLATION (MISCELLANEOUS) ×2 IMPLANT
DRAPE BRACHIAL (DRAPES) ×2 IMPLANT
PACK ANGIOGRAPHY (CUSTOM PROCEDURE TRAY) ×3 IMPLANT
SHEATH BRITE TIP 6FRX5.5 (SHEATH) ×2 IMPLANT
SUT MNCRL AB 4-0 PS2 18 (SUTURE) ×2 IMPLANT
WIRE MAGIC TOR.035 180C (WIRE) ×2 IMPLANT

## 2019-06-04 NOTE — Progress Notes (Signed)
Dr. Lucky Cowboy came by to see pt. Pre procedure. Pt. With slow response, slurred speech. Appropriate responses. Left arm and leg weaker than right.

## 2019-06-04 NOTE — Progress Notes (Signed)
[place], [place] 06/04/19  Subjective:  Patient for fistulogram today. Thereafter due for dialysis. Resting comfortably.  Objective:  Vital signs in last 24 hours:  Temp:  [97.4 F (36.3 C)-98.4 F (36.9 C)] 98.4 F (36.9 C) (09/21 0429) Pulse Rate:  [57-63] 59 (09/21 0911) Resp:  [16-19] 17 (09/21 0429) BP: (128-156)/(65-81) 156/75 (09/21 0911) SpO2:  [91 %-96 %] 92 % (09/21 0429)  Weight change:  Filed Weights   05/30/19 0830 05/30/19 0843 06/02/19 0631  Weight: 77.5 kg 77.5 kg 74.3 kg    Intake/Output:   No intake or output data in the 24 hours ending 06/04/19 1201  Gen:   Alert, cooperative, no distress Head:   Normocephalic, atraumatic Eyes/ENT:  conjunctiva clear,  moist oral mucus membranes, periorbital edema Neck:  Supple, no JVD Lungs:   Clear to auscultation today, room air Heart:   Regular rhythm, no, rub or gallop Abdomen:   Soft, non-tender,  BS present Extremities: no cyanosis or edema Neurologic: Alert and oriented, able to answer questions appropriately Left arm AVG, good thrill    Basic Metabolic Panel:  Recent Labs  Lab 05/28/19 1452  05/29/19 0340 05/30/19 1249 05/31/19 0446 06/01/19 0547 06/02/19 0424  NA  --   --  136 135 133* 133* 135  K  --   --  5.1 4.4 4.7 4.8 4.2  CL  --   --  96* 96* 96* 97* 98  CO2  --   --  28 27 24 24 26   GLUCOSE  --   --  121* 123* 102* 97 93  BUN  --   --  37* 40* 51* 61* 32*  CREATININE  --   --  6.20* 6.98* 8.50* 10.17* 6.90*  CALCIUM  --    < > 9.3 9.1 8.5* 8.5* 8.7*  MG  --   --   --  2.0 2.0  --  1.9  PHOS 3.9  --   --  3.7 4.0  --   --    < > = values in this interval not displayed.     CBC: Recent Labs  Lab 05/31/19 0446 06/01/19 0547 06/02/19 0424 06/02/19 2230 06/03/19 0431 06/03/19 1227 06/04/19 0420  WBC 9.2 7.9 7.6  --  7.9  --  7.6  NEUTROABS 5.7  --   --   --   --   --   --   HGB 9.9* 8.9* 9.0* 8.5* 8.6* 8.4* 8.2*  HCT 28.0* 26.0* 26.3*  --   24.7* 24.7* 24.2*  MCV 98.6 101.6* 100.0  --  100.8*  --  100.4*  PLT 173 161 170  --  169  --  163      Lab Results  Component Value Date   HEPBSAG NEGATIVE 11/30/2013      Microbiology:  Recent Results (from the past 240 hour(s))  SARS Coronavirus 2 [facility] order, Performed in [facility] hospital lab) Nasopharyngeal Nasopharyngeal Swab     Status: None   Collection Time: 05/28/19  7:20 AM   Specimen: Nasopharyngeal Swab  Result Value Ref Range Status   SARS Coronavirus 2 NEGATIVE NEGATIVE Final    Comment: (NOTE) If result is NEGATIVE SARS-CoV-2 target nucleic acids are NOT DETECTED. The SARS-CoV-2 RNA is generally detectable in upper and lower  respiratory specimens during the acute phase of infection. The lowest  concentration of SARS-CoV-2 viral copies this assay can detect is 250  copies / mL. A negative result does not  preclude SARS-CoV-2 infection  and should not be used as the sole basis for treatment or other  patient management decisions.  A negative result may occur with  improper specimen collection / handling, submission of specimen other  than nasopharyngeal swab, presence of viral mutation(s) within the  areas targeted by this assay, and inadequate number of viral copies  (<250 copies / mL). A negative result must be combined with clinical  observations, patient history, and epidemiological information. If result is POSITIVE SARS-CoV-2 target nucleic acids are DETECTED. The SARS-CoV-2 RNA is generally detectable in upper and lower  respiratory specimens dur ing the acute phase of infection.  Positive  results are indicative of active infection with SARS-CoV-2.  Clinical  correlation with patient history and other diagnostic information is  necessary to determine patient infection status.  Positive results do  not rule out bacterial infection or co-infection with other viruses. If result is PRESUMPTIVE POSTIVE SARS-CoV-2 nucleic acids MAY BE PRESENT.    A presumptive positive result was obtained on the submitted specimen  and confirmed on repeat testing.  While 2019 novel coronavirus  (SARS-CoV-2) nucleic acids may be present in the submitted sample  additional confirmatory testing may be necessary for epidemiological  and / or clinical management purposes  to differentiate between  SARS-CoV-2 and other Sarbecovirus currently known to infect humans.  If clinically indicated additional testing with an alternate test  methodology [phone]) is advised. The SARS-CoV-2 RNA is generally  detectable in upper and lower respiratory sp ecimens during the acute  phase of infection. The expected result is Negative. Fact Sheet for Patients:  StrictlyIdeas.no Fact Sheet for Healthcare Providers: BankingDealers.co.za This test is not yet approved or cleared by the [place] FDA and has been authorized for detection and/or diagnosis of SARS-CoV-2 by FDA under an Emergency Use Authorization (EUA).  This EUA will remain in effect (meaning this test can be used) for the duration of the COVID-19 declaration under Section 564(b)(1) of the Act, 21 U.S.C. section 360bbb-3(b)(1), unless the authorization is terminated or revoked sooner. Performed at [facility], [address] [place], [place] [address]   MRSA PCR Screening     Status: None   Collection Time: 05/28/19 10:36 AM   Specimen: Nasal Mucosa; Nasopharyngeal  Result Value Ref Range Status   MRSA by PCR NEGATIVE NEGATIVE Final    Comment:        The GeneXpert MRSA Assay (FDA approved for NASAL specimens only), is one component of a comprehensive MRSA colonization surveillance program. It is not intended to diagnose MRSA infection nor to guide or monitor treatment for MRSA infections. Performed at [facility], [address] [place], [place] [address]     Coagulation Studies: No results for input(s): LABPROT, INR  in the last 72 hours.  Urinalysis: No results for input(s): COLORURINE, LABSPEC, PHURINE, GLUCOSEU, HGBUR, BILIRUBINUR, KETONESUR, PROTEINUR, UROBILINOGEN, NITRITE, LEUKOCYTESUR in the last 72 hours.  Invalid input(s): APPERANCEUR    Imaging: No results found.   Medications:   . sodium chloride Stopped (06/02/19 0700)   . amiodarone  400 mg Oral BID  . amLODipine  10 mg Oral Daily  . aspirin EC  81 mg Oral Daily  . atorvastatin  40 mg Oral q1800  . budesonide  0.5 mg Nebulization Q12H  . calcium carbonate  1 tablet Oral BID WC  . carvedilol  25 mg Oral BID WC  . Chlorhexidine Gluconate Cloth  6 each Topical Q0600  . cinacalcet  30 mg Oral Q supper  . cloNIDine  0.2 mg Oral TID  . epoetin (EPOGEN/PROCRIT) injection  4,000 Units Intravenous Q M,W,F-HD  . furosemide  80 mg Oral BID  . gabapentin  100 mg Oral TID  . hydrALAZINE  100 mg Oral Q6H  . influenza vac split quadrivalent PF  0.5 mL Intramuscular Tomorrow-1000  . irbesartan  300 mg Oral QHS  . loratadine  10 mg Oral Daily  . multivitamin  1 tablet Oral Daily  . nicotine  21 mg Transdermal Daily  . sevelamer carbonate  2,400 mg Oral Daily  . tiotropium  18 mcg Inhalation QPM  . zolpidem  10 mg Oral QHS   sodium chloride, acetaminophen, albuterol, diphenhydrAMINE, docusate sodium, hydrALAZINE, metoprolol tartrate, nitroGLYCERIN, ondansetron (ZOFRAN) IV  Assessment/ Plan:  58 y.o. male with  ESRD, CAD/ CABG 2015, CHF, sleep apnea, alcohol abuse, current smoker, history of chronic lacunar infarcts within left frontal lobe white matter and left basal ganglia  [facility] MWF-1// TW 70kg   Active Problems:   HCAP (healthcare-associated pneumonia)   #. ESRD with hyperkalemia Bleeding from AVG. Last HD was Friday Patient due for fistulogram today.  We will plan for dialysis thereafter.  #. Anemia of CKD  Lab Results  Component Value Date   HGB 8.2 (L) 06/04/2019  - continue Epogen 4000 units  IV with dialysis treatments   #. SHPTH  No results found for: PTH Lab Results  Component Value Date   PHOS 4.0 05/31/2019  Continue cinacalcet and Renvela.  #. HTN  Blood pressure currently 150/75.  Maintain the patient on amlodipine, carvedilol, clonidine, hydralazine, irbesartan  # Pneumonia -Treatment as per primary team.  # A Fib with RVR Stress test negative for ischemia Converted to sinus with amiodarone Evaluated by cardiologist, Dr. Rockey Situ Defer anticoagulation to hospitalist or cardiology.    LOS: 7 Mariselda Badalamenti 9/21/202012:01 PM  [facility] [place], [place]

## 2019-06-04 NOTE — Progress Notes (Signed)
Pre HD Note:    06/04/19 1530  Vital Signs  Temp (!) 97.1 F (36.2 C)  Temp Source Oral  Pulse Rate 62  Pulse Rate Source Dinamap  Resp (!) 21  BP 133/70  BP Location Right Arm  BP Method Automatic  Patient Position (if appropriate) Lying  Oxygen Therapy  O2 Device Room Air  Pain Assessment  Pain Scale 0-10  Pain Score 0  Dialysis Weight  Weight  (unable to weigh pt on bed)  Type of Weight Pre-Dialysis  Time-Out for Hemodialysis  What Procedure? HD   Pt Identifiers(min of two) First/Last Name;MRN/Account#;Pt's DOB(use if MRN/Acct# not available  Correct Site? Yes  Correct Side? Yes  Correct Procedure? Yes  Consents Verified? Yes  Rad Studies Available? N/A  Safety Precautions Reviewed? Yes  Engineer, civil (consulting) Number 3  Station Number 1  Conductivity: Meter 14  Conductivity: Machine  14  pH 7.4  Reverse Osmosis main  Normal Saline Lot Number [id number]  Dialyzer Lot Number [id number]  Disposable Set Lot Number [id number]  Machine Temperature 98.6 F (37 C)  Musician and Audible Yes  Blood Lines Intact and Secured Yes  Pre Treatment Patient Checks  Vascular access used during treatment Fistula  HD catheter dressing before treatment  (n/a)  Patient is receiving dialysis in a chair  (no)  Hepatitis B Surface Antigen Results Pending  Date Hepatitis B Surface Antigen Drawn 06/04/19  Isolation Initiated  (yes)  Hepatitis B Surface Antibody  (24)  Date Hepatitis B Surface Antibody Drawn 04/09/19  Hemodialysis Consent Verified Yes  Hemodialysis Standing Orders Initiated Yes  ECG (Telemetry) Monitor On Yes  Prime Ordered Normal Saline  Dialysis Treatment Comments  (Na 140)  Dialyzer Elisio 17H NR  Dialysate 2K;2.5 Ca  Dialysate Flow Ordered 800  Blood Flow Rate Ordered 400 mL/min  Ultrafiltration Goal 3 Liters  Dialysis Blood Pressure Support Ordered Normal Saline  Education / Care Plan  Dialysis Education Provided Yes  Documented Education in Care  Plan Yes  Outpatient Plan of Care Reviewed and on Chart Yes  Fistula / Graft Left Forearm  Placement Date: (c) 12/14/18   Placed prior to admission: Yes  Orientation: Left  Access Location: Forearm  Site Condition No complications  Fistula / Graft Assessment Bruit;Thrill;Present  Status Accessed  Needle Size 15  Drainage Description None

## 2019-06-04 NOTE — Progress Notes (Signed)
HD Completed:    06/04/19 1930  Vital Signs  Temp 97.7 F (36.5 C)  Temp Source Oral  Pulse Rate 60  Pulse Rate Source Dinamap  Resp 11  BP (!) 169/70  BP Location Right Arm  BP Method Automatic  Patient Position (if appropriate) Lying  Oxygen Therapy  SpO2 97 %  O2 Device Nasal Cannula  O2 Flow Rate (L/min) 2 L/min  Pain Assessment  Pain Scale 0-10  Pain Score 0  During Hemodialysis Assessment  HD Safety Checks Performed Yes  KECN 59.7 KECN  Dialysis Fluid Bolus Normal Saline  Bolus Amount (mL) 250 mL  Intra-Hemodialysis Comments Progressing as prescribed

## 2019-06-04 NOTE — Progress Notes (Signed)
[place] at [place] NAME: Daryl Weeks    MR#:  [id number]  DATE OF BIRTH:  1961/01/13  SUBJECTIVE:  CHIEF COMPLAINT:   Chief Complaint  Patient presents with  . Shortness of Breath  No other episodes of bleeding from the patient last night.  Patient is awaiting for fistulogram today followed by hemodialysis  patient gets hemodialysis Monday Wednesday Friday according to the schedule REVIEW OF SYSTEMS:  CONSTITUTIONAL: No fever, fatigue or weakness.  EYES: No blurred or double vision.  EARS, NOSE, AND THROAT: No tinnitus or ear pain.  RESPIRATORY: have cough, shortness of breath with exertion, no wheezing or hemoptysis.  CARDIOVASCULAR: No chest pain, orthopnea, edema.  GASTROINTESTINAL: No nausea, vomiting, diarrhea or abdominal pain.  GENITOURINARY: No dysuria, hematuria.  ENDOCRINE: No polyuria, nocturia,  HEMATOLOGY: No anemia, easy bruising or bleeding SKIN: No rash or lesion. MUSCULOSKELETAL: No joint pain or arthritis.   NEUROLOGIC: No tingling, numbness, weakness.  PSYCHIATRY: No anxiety or depression.   ROS  DRUG ALLERGIES:   Allergies  Allergen Reactions  . Lisinopril     VITALS:  Blood pressure (!) 154/77, pulse (!) 54, temperature 97.8 F (36.6 C), temperature source Oral, resp. rate 20, height 5\' 7"  (1.702 m), weight 74.3 kg, SpO2 94 %.  PHYSICAL EXAMINATION:  GENERAL:  58 y.o.-year-old patient lying in the bed with no acute distress.  EYES: Pupils equal, round, reactive to light and accommodation. No scleral icterus. Extraocular muscles intact.  HEENT: Head atraumatic, normocephalic. Oropharynx and nasopharynx clear.  NECK:  Supple, no jugular venous distention. No thyroid enlargement, no tenderness.  LUNGS: Normal breath sounds bilaterally, no wheezing, some crepitation. No use of accessory muscles of respiration.  CARDIOVASCULAR: S1, S2 normal. No murmurs, rubs, or gallops.  ABDOMEN: Soft, nontender,  nondistended. Bowel sounds present. EXTREMITIES: No pedal edema, cyanosis, or clubbing.  NEUROLOGIC: Cranial nerves II through XII are intact. Muscle strength 5/5 in all extremities. Sensation intact. Gait not checked.  PSYCHIATRIC: The patient is alert and oriented x 3.  SKIN: No obvious rash, lesion, or ulcer.   Physical Exam LABORATORY PANEL:   CBC Recent Labs  Lab 06/04/19 0420  WBC 7.6  HGB 8.2*  HCT 24.2*  PLT 163   ------------------------------------------------------------------------------------------------------------------  Chemistries  Recent Labs  Lab 05/30/19 1249  06/02/19 0424  NA 135   < > 135  K 4.4   < > 4.2  CL 96*   < > 98  CO2 27   < > 26  GLUCOSE 123*   < > 93  BUN 40*   < > 32*  CREATININE 6.98*   < > 6.90*  CALCIUM 9.1   < > 8.7*  MG 2.0   < > 1.9  AST 13*  --   --   ALT 10  --   --   ALKPHOS 115  --   --   BILITOT 1.0  --   --    < > = values in this interval not displayed.   ------------------------------------------------------------------------------------------------------------------  Cardiac Enzymes No results for input(s): TROPONINI in the last 168 hours. ------------------------------------------------------------------------------------------------------------------  RADIOLOGY:  No results found.  ASSESSMENT AND PLAN:  * Bleeding from fistula site  Monitor hemoglobin closely and transfuse as needed Hemodynamically stable and hemoglobin at 8.5-8.6-8.4 No active bleeding  Vascular -scheduled fistulogram today Check a.m. labs  * Atrial fibrillation with rapid ventricular response -new onset, now converted to sinus rhythm -2D echo shows EF of 50  to 55% - Continue Coreg for rate control - holding  heparin GTT in view of bleeding from fistula site  - --Continue BB, amiodarone.  -Stress test on 06/01/2019-myocardial perfusion imaging study with no significant ischemia.  Small mild fixed apical perfusion defect unable to exclude  attenuation artifact versus small prior MI  * sepsis due to Pneumonia   Cont IV Abx.   MRSA and COVID test negative.   * Ac respi failure- hypoxia   Supplemental oxygen.    * Speech difficulty -could be TIA, it is resolved   Stat CT head as he had 2 falls- negative.  MRI negative. -Continue ASA and atorvastatin.  * Uncontrolled Htn   Cont home meds, Give Hydralazine IV PRN  * ESRD On HD  getting dialyzed per nephrology.  Monday Wednesday Friday -Dialysis scheduled for for Monday hopefully after fistulogram appreciate nephrology recommendations, discussed with Dr. Zollie Scale  * Hyperkalemia Resolved after dialysis  * Active smoker  Counselled to Quit for 4 min. by admitting doctor      All the records are reviewed and case discussed with Care Management/Social Workerr. Management plans discussed with the patient, nursing, and they are in agreement.  CODE STATUS: full.  TOTAL TIME TAKING CARE OF THIS PATIENT: 35 minutes.     POSSIBLE D/C IN 2-3 DAYS, DEPENDING ON CLINICAL CONDITION.   Nicholes Mango M.D on 06/04/2019   Between 7am to 6pm - Pager - [phone]  After 6pm go to [email or web address] - password [email or web address] [facility]  Office  [phone]  CC: Primary care physician; Mikey College, NP  Note: This dictation was prepared with Dragon dictation along with smaller phrase technology. Any transcriptional errors that result from this process are unintentional.

## 2019-06-04 NOTE — Progress Notes (Signed)
Post HD:    06/04/19 1945  Vital Signs  Temp 97.7 F (36.5 C)  Temp Source Oral  Pulse Rate 62  Pulse Rate Source Dinamap  Resp 12  BP (!) 176/72  BP Location Right Arm  BP Method Automatic  Patient Position (if appropriate) Lying  Oxygen Therapy  SpO2 97 %  O2 Device Nasal Cannula  O2 Flow Rate (L/min) 2 L/min  Pain Assessment  Pain Scale 0-10  Pain Score 0  Post-Hemodialysis Assessment  Rinseback Volume (mL) 250 mL  KECN 59.7 V  Dialyzer Clearance Lightly streaked  Duration of HD Treatment -hour(s) 3.5 hour(s)  Hemodialysis Intake (mL) 500 mL  UF Total -Machine (mL) 3012 mL  Net UF (mL) 2512 mL  Tolerated HD Treatment Yes  AVG/AVF Arterial Site Held (minutes)  (5)  AVG/AVF Venous Site Held (minutes)  (5)  Education / Care Plan  Dialysis Education Provided Yes  Documented Education in Care Plan Yes  Outpatient Plan of Care Reviewed and on Chart Yes  Fistula / Graft Left Forearm  Placement Date: (c) 12/14/18   Placed prior to admission: Yes  Orientation: Left  Access Location: Forearm  Site Condition No complications  Fistula / Graft Assessment Bruit;Thrill;Present  Status Deaccessed  Drainage Description None

## 2019-06-04 NOTE — Progress Notes (Signed)
Pre HD Assessment:     06/04/19 1531  Neurological  Level of Consciousness Alert  Orientation Level Oriented X4  Respiratory  Respiratory Pattern Regular;Unlabored  Chest Assessment Chest expansion symmetrical  Bilateral Breath Sounds Clear  Cardiac  Pulse Regular  Heart Sounds S1, S2  Jugular Venous Distention (JVD) No  ECG Monitor Yes  Cardiac Rhythm SB  Vascular  R Radial Pulse +2  L Radial Pulse +2  Psychosocial  Psychosocial (WDL) X  Patient Behaviors Sad

## 2019-06-04 NOTE — Progress Notes (Signed)
Post HD Assessment:    06/04/19 1945  Neurological  Level of Consciousness Alert  Orientation Level Oriented X4  Respiratory  Respiratory Pattern Regular;Unlabored  Chest Assessment Chest expansion symmetrical  Bilateral Breath Sounds Clear;Diminished  Cardiac  Pulse Regular  Heart Sounds S1, S2  Jugular Venous Distention (JVD) No  ECG Monitor Yes  Cardiac Rhythm SB  Vascular  R Radial Pulse +2  L Radial Pulse +2  Psychosocial  Psychosocial (WDL) X  Patient Behaviors Sad

## 2019-06-04 NOTE — Plan of Care (Signed)
  Problem: Activity: Goal: Risk for activity intolerance will decrease Outcome: Progressing  Pt ambulating in room.

## 2019-06-04 NOTE — Progress Notes (Signed)
HD Tx Initiated:    06/04/19 1554  Vital Signs  Temp (!) 97.1 F (36.2 C)  Temp Source Oral  Pulse Rate 62  Pulse Rate Source Dinamap  BP 132/73  BP Location Right Arm  BP Method Automatic  Patient Position (if appropriate) Lying  Oxygen Therapy  SpO2  (pulse ox not registering)  O2 Device Room Air  Pain Assessment  Pain Scale 0-10  Pain Score 0  Dialysis Weight  Weight  (unable to weigh pt on bed)  Type of Weight Pre-Dialysis  During Hemodialysis Assessment  Blood Flow Rate (mL/min) 400 mL/min  Arterial Pressure (mmHg) -260 mmHg  Venous Pressure (mmHg) 120 mmHg  Transmembrane Pressure (mmHg) 90 mmHg  Ultrafiltration Rate (mL/min) 1000 mL/min  Dialysate Flow Rate (mL/min) 600 ml/min  Conductivity: Machine  14  HD Safety Checks Performed Yes  Intra-Hemodialysis Comments Tx initiated

## 2019-06-04 NOTE — Op Note (Signed)
[facility]    OPERATIVE NOTE   PROCEDURE: 1.   Left brachiobasilic/Artergraft jump graft arteriovenous fistula cannulation under ultrasound guidance 2.   Left arm fistulagram including central venogram 3.   Percutaneous transluminal angioplasty of the anastomosis of the jump graft to the basilic vein at the elbow with 7 mm diameter Lutonix drug coated balloon.  PRE-OPERATIVE DIAGNOSIS: 1. ESRD 2. Poorly functional left arm AVF  POST-OPERATIVE DIAGNOSIS: same as above   SURGEON: Leotis Pain, MD  ANESTHESIA: local with MCS  ESTIMATED BLOOD LOSS: 3 cc  FINDING(S): 1. There was a napkin ringlike stenosis of the Artegraft jump graft to the basilic vein anastomosis near the elbow in the 70 to 80% range.  The remainder of the jump graft and the anastomosis near the wrist were widely patent.  The original radial basilic anastomosis was widely patent.  The basilic vein outflow was normal and the central venous circulation was patent  SPECIMEN(S):  None  CONTRAST: 40 cc  FLUORO TIME: 1.8 minutes  MODERATE CONSCIOUS SEDATION TIME: Approximately 15 minutes with 2 mg of Versed and 50 mcg of Fentanyl   INDICATIONS: Daryl Weeks is a 58 y.o. male who presents with malfunctioning left radial basilic/Artegraft jump graft arteriovenous fistula.  The patient is scheduled for left arm fistulagram.  The patient is aware the risks include but are not limited to: bleeding, infection, thrombosis of the cannulated access, and possible anaphylactic reaction to the contrast.  The patient is aware of the risks of the procedure and elects to proceed forward.  DESCRIPTION: After full informed written consent was obtained, the patient was brought back to the angiography suite and placed supine upon the angiography table.  The patient was connected to monitoring equipment. Moderate conscious sedation was administered with a face to face encounter with the patient throughout the procedure  with my supervision of the RN administering medicines and monitoring the patient's vital signs and mental status throughout from the start of the procedure until the patient was taken to the recovery room. The left arm was prepped and draped in the standard fashion for a percutaneous access intervention.  Under ultrasound guidance, the original portion of the left radial basilic arteriovenous fistula was cannulated with a micropuncture needle in an antegrade fashion just beyond the original left radial basilic anastomosis under direct ultrasound guidance where it was patent and a permanent image was performed.  The microwire was advanced into the fistula and the needle was exchanged for the a microsheath.  I then upsized to a 6 Fr Sheath and imaging was performed.  Hand injections were completed to image the access including the central venous system. This demonstrated a napkin ringlike stenosis of the Artegraft jump graft to the basilic vein anastomosis near the elbow in the 70 to 80% range.  The remainder of the jump graft and the anastomosis near the wrist were widely patent.  The original radial basilic anastomosis was widely patent.  The basilic vein outflow was normal and the central venous circulation was patent.  Based on the images, this patient will need intervention to the anastomotic stenosis near the elbow. I then gave the patient 3000 units of intravenous heparin.  I then crossed the stenosis with a Magic Tourqe wire.  Based on the imaging, a 7 mm x 6 cm Lutonix drug-coated angioplasty balloon was selected.  The balloon was centered around the anastomotic stenosis and inflated to 12 ATM for 1 minute(s).  On completion imaging, a  15-20% residual stenosis was present.     Based on the completion imaging, no further intervention is necessary.  The wire and balloon were removed from the sheath.  A 4-0 Monocryl purse-string suture was sewn around the sheath.  The sheath was removed while tying down the  suture.  A sterile bandage was applied to the puncture site.  COMPLICATIONS: None  CONDITION: Stable   Leotis Pain  06/04/2019 2:43 PM   This note was created with Dragon Medical transcription system. Any errors in dictation are purely unintentional.

## 2019-06-04 NOTE — Care Management Important Message (Signed)
Important Message  Patient Details  Name: ZAIAH RUA MRN: [id number] Date of Birth: 08-Jul-1961   Medicare Important Message Given:  Yes     Dannette Barbara 06/04/2019, 11:16 AM

## 2019-06-05 ENCOUNTER — Encounter: Payer: Self-pay | Admitting: Vascular Surgery

## 2019-06-05 LAB — CBC
HCT: 25.3 % — ABNORMAL LOW (ref 39.0–52.0)
Hemoglobin: 8.5 g/dL — ABNORMAL LOW (ref 13.0–17.0)
MCH: 34 pg (ref 26.0–34.0)
MCHC: 33.6 g/dL (ref 30.0–36.0)
MCV: 101.2 fL — ABNORMAL HIGH (ref 80.0–100.0)
Platelets: 180 10*3/uL (ref 150–400)
RBC: 2.5 MIL/uL — ABNORMAL LOW (ref 4.22–5.81)
RDW: 13.6 % (ref 11.5–15.5)
WBC: 8 10*3/uL (ref 4.0–10.5)
nRBC: 0 % (ref 0.0–0.2)

## 2019-06-05 LAB — BASIC METABOLIC PANEL
Anion gap: 10 (ref 5–15)
BUN: 29 mg/dL — ABNORMAL HIGH (ref 6–20)
CO2: 26 mmol/L (ref 22–32)
Calcium: 8.2 mg/dL — ABNORMAL LOW (ref 8.9–10.3)
Chloride: 99 mmol/L (ref 98–111)
Creatinine, Ser: 7.41 mg/dL — ABNORMAL HIGH (ref 0.61–1.24)
GFR calc Af Amer: 9 mL/min — ABNORMAL LOW (ref >60)
GFR calc non Af Amer: 7 mL/min — ABNORMAL LOW (ref >60)
Glucose, Bld: 88 mg/dL (ref 70–99)
Potassium: 4.2 mmol/L (ref 3.5–5.1)
Sodium: 135 mmol/L (ref 135–145)

## 2019-06-05 MED ORDER — APIXABAN 5 MG PO TABS
5.0000 mg | ORAL_TABLET | Freq: Two times a day (BID) | ORAL | Status: DC
  Administered 2019-06-05: 5 mg via ORAL
  Filled 2019-06-05: qty 1

## 2019-06-05 MED ORDER — AMIODARONE HCL 400 MG PO TABS: 200.0000 mg | ORAL_TABLET | Freq: Two times a day (BID) | ORAL | 0 refills | Status: DC

## 2019-06-05 MED ORDER — LORATADINE 10 MG PO TABS: 10.0000 mg | ORAL_TABLET | Freq: Every day | ORAL | 0 refills | Status: AC

## 2019-06-05 MED ORDER — IRBESARTAN 300 MG PO TABS: 300.0000 mg | ORAL_TABLET | Freq: Every day | ORAL | 0 refills | Status: DC

## 2019-06-05 MED ORDER — APIXABAN 5 MG PO TABS: 5.0000 mg | ORAL_TABLET | Freq: Two times a day (BID) | ORAL | 0 refills | Status: DC

## 2019-06-05 MED ORDER — NITROGLYCERIN 0.4 MG SL SUBL: 0.4000 mg | SUBLINGUAL_TABLET | SUBLINGUAL | 0 refills | Status: DC | PRN

## 2019-06-05 MED ORDER — CARVEDILOL 25 MG PO TABS: 25.0000 mg | ORAL_TABLET | Freq: Two times a day (BID) | ORAL | 0 refills | Status: DC

## 2019-06-05 MED ORDER — AMIODARONE HCL 400 MG PO TABS: 400.0000 mg | ORAL_TABLET | Freq: Two times a day (BID) | ORAL | 0 refills | Status: DC

## 2019-06-05 MED ORDER — DOCUSATE SODIUM 100 MG PO CAPS: 100.0000 mg | ORAL_CAPSULE | Freq: Two times a day (BID) | ORAL | 0 refills | Status: AC | PRN

## 2019-06-05 MED ORDER — HYDRALAZINE HCL 100 MG PO TABS: 100.0000 mg | ORAL_TABLET | Freq: Four times a day (QID) | ORAL | 0 refills | Status: DC

## 2019-06-05 MED ORDER — CLONIDINE HCL 0.2 MG PO TABS: 0.2000 mg | ORAL_TABLET | Freq: Three times a day (TID) | ORAL | 0 refills | Status: DC

## 2019-06-05 MED ORDER — NICOTINE 21 MG/24HR TD PT24: 21.0000 mg | MEDICATED_PATCH | Freq: Every day | TRANSDERMAL | 0 refills | Status: DC

## 2019-06-05 NOTE — TOC Transition Note (Signed)
Transition of Care [facility]) - CM/SW Discharge Note   Patient Details  Name: Daryl Weeks MRN: [id number] Date of Birth: 11/19/1960  Transition of Care [facility]) CM/SW Contact:  Beverly Sessions, RN Phone Number: 06/05/2019, 3:56 PM   Clinical Narrative:    Patient to discharge today.  Sister at bedside for transport  Patient request that [facility] call [facility] to determine his out of pocket expense for his medications.   Pharmacy states there are 7 medications ready for pick up.  Cost for 7 is $30.54  Irbesartan can not be filled till November.    Amiodarone is only covered by 200mg  daily per insurance  Patient provided with goodrx coupons $17.63 at [employer], $58.96 at [facility]   Final next level of care: Home/Self Care Barriers to Discharge: Continued Medical Work up   Patient Goals and CMS Choice     Choice offered to / list presented to : NA  Discharge Placement                       Discharge Plan and Services     Post Acute Care Choice: NA                               Social Determinants of Health (SDOH) Interventions     Readmission Risk Interventions Readmission Risk Prevention Plan 05/31/2019  Transportation Screening Complete  HRI or [place] Complete  Social Work Consult for [place] Planning/Counseling Complete  Palliative Care Screening Not Applicable  Medication Review [employer]) Complete  Some recent data might be hidden

## 2019-06-05 NOTE — Discharge Summary (Addendum)
Daryl Weeks at [place] NAME: Daryl Weeks    MR#:  [id number]  DATE OF BIRTH:  19-Jan-1961  DATE OF ADMISSION:  05/28/2019 ADMITTING PHYSICIAN: Vaughan Basta, MD  DATE OF DISCHARGE: 06/05/19  PRIMARY CARE PHYSICIAN: Mikey College, NP    ADMISSION DIAGNOSIS:  End stage renal disease on dialysis ([place]) [N18.6, Z99.2] HCAP (healthcare-associated pneumonia) [J18.9] Dyspnea, unspecified type [R06.00]  DISCHARGE DIAGNOSIS:  Active Problems:   HCAP (healthcare-associated pneumonia)   SECONDARY DIAGNOSIS:   Past Medical History:  Diagnosis Date  . (HFpEF) heart failure with preserved ejection fraction ([place])    a. 05/2019 Echo: EF 50-55%, diast dysfxn, RVSP 56.89mmHg, Sev dil LA. Mildly dil PA.  [name] Anemia   . Anxiety   . Arthritis   . Colon polyps   . COPD (chronic obstructive pulmonary disease) ([place])   . Coronary artery disease    a. 10/2013 NSTEMI/Cath: Severe 3VD-->CABG x 4 @ [facility] 01/2014 (LIMA->LAD, VG->OM1->OM2, VG->RCA); b. 11/2014 MV: No isch/infarct.  [name] ESRD (end stage renal disease) ([place])    a. MWFSat HD  . ETOH abuse    a. 2 beers/night.  [name] GERD (gastroesophageal reflux disease)   . History of pneumonia   . Hyperlipidemia   . Hypertension   . IgA nephropathy   . Lower GI bleed    a. Due to colon polyps. Status post resection of 14 polyps  . Non-ST elevation MI (NSTEMI) ([place])   . Rheumatoid arthritis ([place])   . Shortness of breath   . Sleep apnea   . Tobacco abuse     HOSPITAL COURSE:   HPI  HISTORY OF PRESENT ILLNESS: Daryl Weeks  is a 58 y.o. male with a known history of CHF, ESRD on HD< CAD, HLD, Htn- lives alone and had 2 falls Saturday ( 2 days ago) which he claims to be accidental and denies any dizziness, focal weakness, numbness. Since last night he felt his speech is altered and have some weakness generalized. He woke up early for his morning HD_ spoke to his sister on phone at 4;30 am and she  noticed his speech is not right. He went for HD and after 30 min they terminated treatment due to Hypoxia and high BP. In ER he was noted to have Pneumonia. COVID 19 test negative. His K is high. And he is hypoxic.   * Bleeding from fistula site  Monitored hemoglobin closely  Hemodynamically stable and hemoglobin at 8.5-8.6-8.4-8.5  No active bleeding today Vascular  has seen the patient and patient had fistulogram  with angioplasty on 06/04/2019.  Tolerated procedure well.  Hemodialysis was done yesterday after fistulogram and okay to discharge patient from vascular standpoint Agreeable with the Eliquis, which is started today  * Atrial fibrillation with rapid ventricular response -new onset, now converted to sinus rhythm -2D echo shows EF of 50 to 55% - Continue Coreg for rate control - held  heparin GTT in view of bleeding from fistula site  - --Continue BB, amiodarone 200 mg bid as he completed 400 bid at hospital -Stress test on 06/01/2019-myocardial perfusion imaging study with no significant ischemia.  Small mild fixed apical perfusion defect unable to exclude attenuation artifact versus small prior MI -Resuming Eliquis today -  * sepsis due to Pneumonia  Abx. MRSA and COVID test negative.  * Ac respi failure- hypoxia Supplemental oxygen.  * Speech difficulty -could be TIA, it is resolved Stat CT head as he had 2  falls- negative.  MRI negative. -D/C  ASA and continue atorvastatin.  * Uncontrolled Htn- better now  Cont home meds, Give Hydralazine IV PRN  * ESRD On HD  getting dialyzed per nephrology.  Monday Wednesday Friday -Dialysis scheduled for for Monday hopefully after fistulogram appreciate nephrology recommendations, discussed with Dr. Zollie Scale  * Hyperkalemia Resolved after dialysis  * Active smoker Counselled to Quit for 4 min. by admitting doctor    DISCHARGE CONDITIONS:   fair  CONSULTS OBTAINED:  Treatment Team:  Murlean Iba,  MD   PROCEDURES fistulogram and angioplasty  DRUG ALLERGIES:   Allergies  Allergen Reactions  . Lisinopril     DISCHARGE MEDICATIONS:   Allergies as of 06/05/2019      Reactions   Lisinopril       Medication List    STOP taking these medications   aspirin EC 81 MG tablet   lidocaine-prilocaine cream Commonly known as: EMLA   metoprolol tartrate 100 MG tablet Commonly known as: LOPRESSOR     TAKE these medications   Ambien 10 MG tablet Generic drug: zolpidem Take 10 mg by mouth at bedtime.   amiodarone 400 MG tablet Commonly known as: PACERONE Take 0.5 tablets (200 mg total) by mouth 2 (two) times daily.   amLODipine 10 MG tablet Commonly known as: NORVASC take 1 tablet by mouth once daily   apixaban 5 MG Tabs tablet Commonly known as: ELIQUIS Take 1 tablet (5 mg total) by mouth 2 (two) times daily.   atorvastatin 20 MG tablet Commonly known as: LIPITOR take 1 tablet by mouth at bedtime   Auryxia 1 GM 210 MG(Fe) tablet Generic drug: ferric citrate TK 1 T PO  TID BEFORE EACH MEAL   calcium carbonate 500 MG chewable tablet Commonly known as: TUMS - dosed in mg elemental calcium Chew 1 tablet by mouth as needed.   carvedilol 25 MG tablet Commonly known as: COREG Take 1 tablet (25 mg total) by mouth 2 (two) times daily with a meal.   cloNIDine 0.2 MG tablet Commonly known as: CATAPRES Take 1 tablet (0.2 mg total) by mouth 3 (three) times daily. What changed:   medication strength  See the new instructions.   docusate sodium 100 MG capsule Commonly known as: COLACE Take 1 capsule (100 mg total) by mouth 2 (two) times daily as needed for mild constipation.   furosemide 80 MG tablet Commonly known as: LASIX Take 80 mg by mouth 2 (two) times daily.   gabapentin 100 MG capsule Commonly known as: NEURONTIN TK 1 C PO TID   hydrALAZINE 100 MG tablet Commonly known as: APRESOLINE Take 1 tablet (100 mg total) by mouth every 6 (six) hours.    irbesartan 300 MG tablet Commonly known as: AVAPRO Take 1 tablet (300 mg total) by mouth at bedtime. What changed:   medication strength  See the new instructions.   irbesartan 300 MG tablet Commonly known as: AVAPRO Take 1 tablet (300 mg total) by mouth at bedtime. What changed: Another medication with the same name was changed. Make sure you understand how and when to take each.   loratadine 10 MG tablet Commonly known as: CLARITIN Take 1 tablet (10 mg total) by mouth daily. Start taking on: June 06, 2019   multivitamin Tabs tablet Take 1 tablet by mouth daily.   nicotine 21 mg/24hr patch Commonly known as: NICODERM CQ - dosed in mg/24 hours Place 1 patch (21 mg total) onto the skin daily.   nitroGLYCERIN 0.4  MG SL tablet Commonly known as: NITROSTAT Place 1 tablet (0.4 mg total) under the tongue every 5 (five) minutes as needed for chest pain.   ondansetron 4 MG tablet Commonly known as: ZOFRAN TK 1 T PO BID PRF NAUSEA   Pulmicort 0.5 MG/2ML nebulizer solution Generic drug: budesonide Take 2 mLs (0.5 mg total) by nebulization every 12 (twelve) hours.   budesonide 180 MCG/ACT inhaler Commonly known as: Pulmicort Flexhaler Inhale 1 puff into the lungs 2 (two) times daily.   Sensipar 30 MG tablet Generic drug: cinacalcet Take 30 mg by mouth daily with supper.   sevelamer carbonate 800 MG tablet Commonly known as: RENVELA Take 2,400 mg by mouth daily.   tiotropium 18 MCG inhalation capsule Commonly known as: SPIRIVA Place 1 capsule (18 mcg total) into inhaler and inhale daily. What changed: when to take this   Ventolin HFA 108 (90 Base) MCG/ACT inhaler Generic drug: albuterol Inhale 2 puffs into the lungs every 4 (four) hours as needed for wheezing or shortness of breath.        DISCHARGE INSTRUCTIONS:   Follow-up with primary care physician in 3 to 4 days Continue hemodialysis on Monday, Wednesday and Friday Follow-up with nephrology in a  week Follow-up with cardiology Dr. Fletcher Anon in 10 days  DIET:  Renal diet  DISCHARGE CONDITION:  Fair  ACTIVITY:  Activity as tolerated  OXYGEN:  Home Oxygen: No.   Oxygen Delivery: room air  DISCHARGE LOCATION:  home   If you experience worsening of your admission symptoms, develop shortness of breath, life threatening emergency, suicidal or homicidal thoughts you must seek medical attention immediately by calling 911 or calling your MD immediately  if symptoms less severe.  You Must read complete instructions/literature along with all the possible adverse reactions/side effects for all the Medicines you take and that have been prescribed to you. Take any new Medicines after you have completely understood and accpet all the possible adverse reactions/side effects.   Please note  You were cared for by a hospitalist during your hospital stay. If you have any questions about your discharge medications or the care you received while you were in the hospital after you are discharged, you can call the unit and asked to speak with the hospitalist on call if the hospitalist that took care of you is not available. Once you are discharged, your primary care physician will handle any further medical issues. Please note that NO REFILLS for any discharge medications will be authorized once you are discharged, as it is imperative that you return to your primary care physician (or establish a relationship with a primary care physician if you do not have one) for your aftercare needs so that they can reassess your need for medications and monitor your lab values.     Today  Chief Complaint  Patient presents with  . Shortness of Breath   Patient is doing fine.  No other episodes of bleeding from the fistula.  Had hemodialysis yesterday.  Okay to discharge patient from nephrology, cardiology standpoint.  Patient feels comfortable to go home  ROS:  CONSTITUTIONAL: Denies fevers, chills. Denies any  fatigue, weakness.  EYES: Denies blurry vision, double vision, eye pain. EARS, NOSE, THROAT: Denies tinnitus, ear pain, hearing loss. RESPIRATORY: Denies cough, wheeze, shortness of breath.  CARDIOVASCULAR: Denies chest pain, palpitations, edema.  GASTROINTESTINAL: Denies nausea, vomiting, diarrhea, abdominal pain. Denies bright red blood per rectum. GENITOURINARY: Denies dysuria, hematuria. ENDOCRINE: Denies nocturia or thyroid problems. HEMATOLOGIC AND LYMPHATIC:  Denies easy bruising or bleeding. SKIN: Denies rash or lesion. MUSCULOSKELETAL: Denies pain in neck, back, shoulder, knees, hips or arthritic symptoms.  NEUROLOGIC: Denies paralysis, paresthesias.  PSYCHIATRIC: Denies anxiety or depressive symptoms.   VITAL SIGNS:  Blood pressure 138/76, pulse (!) 56, temperature 97.9 F (36.6 C), resp. rate 20, height 5\' 7"  (1.702 m), weight 74.3 kg, SpO2 97 %.  I/O:    Intake/Output Summary (Last 24 hours) at 06/05/2019 1520 Last data filed at 06/04/2019 1945 Gross per 24 hour  Intake -  Output 2512 ml  Net -2512 ml    PHYSICAL EXAMINATION:  GENERAL:  58 y.o.-year-old patient lying in the bed with no acute distress.  EYES: Pupils equal, round, reactive to light and accommodation. No scleral icterus. Extraocular muscles intact.  HEENT: Head atraumatic, normocephalic. Oropharynx and nasopharynx clear.  NECK:  Supple, no jugular venous distention. No thyroid enlargement, no tenderness.  LUNGS: Normal breath sounds bilaterally, no wheezing, rales,rhonchi or crepitation. No use of accessory muscles of respiration.  CARDIOVASCULAR: S1, S2 normal. No murmurs, rubs, or gallops.  ABDOMEN: Soft, non-tender, non-distended. Bowel sounds present.  EXTREMITIES: No pedal edema, cyanosis, or clubbing.  NEUROLOGIC: Cranial nerves II through XII are intact. Muscle strength 5/5 in all extremities. Sensation intact. Gait not checked.  PSYCHIATRIC: The patient is alert and oriented x 3.  SKIN: No  obvious rash, lesion, or ulcer.   DATA REVIEW:   CBC Recent Labs  Lab 06/05/19 0423  WBC 8.0  HGB 8.5*  HCT 25.3*  PLT 180    Chemistries  Recent Labs  Lab 05/30/19 1249  06/02/19 0424 06/05/19 0423  NA 135   < > 135 135  K 4.4   < > 4.2 4.2  CL 96*   < > 98 99  CO2 27   < > 26 26  GLUCOSE 123*   < > 93 88  BUN 40*   < > 32* 29*  CREATININE 6.98*   < > 6.90* 7.41*  CALCIUM 9.1   < > 8.7* 8.2*  MG 2.0   < > 1.9  --   AST 13*  --   --   --   ALT 10  --   --   --   ALKPHOS 115  --   --   --   BILITOT 1.0  --   --   --    < > = values in this interval not displayed.    Cardiac Enzymes No results for input(s): TROPONINI in the last 168 hours.  Microbiology Results  Results for orders placed or performed during the hospital encounter of 05/28/19  SARS Coronavirus 2 [facility] order, Performed in [facility] hospital lab) Nasopharyngeal Nasopharyngeal Swab     Status: None   Collection Time: 05/28/19  7:20 AM   Specimen: Nasopharyngeal Swab  Result Value Ref Range Status   SARS Coronavirus 2 NEGATIVE NEGATIVE Final    Comment: (NOTE) If result is NEGATIVE SARS-CoV-2 target nucleic acids are NOT DETECTED. The SARS-CoV-2 RNA is generally detectable in upper and lower  respiratory specimens during the acute phase of infection. The lowest  concentration of SARS-CoV-2 viral copies this assay can detect is 250  copies / mL. A negative result does not preclude SARS-CoV-2 infection  and should not be used as the sole basis for treatment or other  patient management decisions.  A negative result may occur with  improper specimen collection / handling, submission of specimen other  than nasopharyngeal swab,  presence of viral mutation(s) within the  areas targeted by this assay, and inadequate number of viral copies  (<250 copies / mL). A negative result must be combined with clinical  observations, patient history, and epidemiological information. If result is  POSITIVE SARS-CoV-2 target nucleic acids are DETECTED. The SARS-CoV-2 RNA is generally detectable in upper and lower  respiratory specimens dur ing the acute phase of infection.  Positive  results are indicative of active infection with SARS-CoV-2.  Clinical  correlation with patient history and other diagnostic information is  necessary to determine patient infection status.  Positive results do  not rule out bacterial infection or co-infection with other viruses. If result is PRESUMPTIVE POSTIVE SARS-CoV-2 nucleic acids MAY BE PRESENT.   A presumptive positive result was obtained on the submitted specimen  and confirmed on repeat testing.  While 2019 novel coronavirus  (SARS-CoV-2) nucleic acids may be present in the submitted sample  additional confirmatory testing may be necessary for epidemiological  and / or clinical management purposes  to differentiate between  SARS-CoV-2 and other Sarbecovirus currently known to infect humans.  If clinically indicated additional testing with an alternate test  methodology [phone]) is advised. The SARS-CoV-2 RNA is generally  detectable in upper and lower respiratory sp ecimens during the acute  phase of infection. The expected result is Negative. Fact Sheet for Patients:  StrictlyIdeas.no Fact Sheet for Healthcare Providers: BankingDealers.co.za This test is not yet approved or cleared by the [place] FDA and has been authorized for detection and/or diagnosis of SARS-CoV-2 by FDA under an Emergency Use Authorization (EUA).  This EUA will remain in effect (meaning this test can be used) for the duration of the COVID-19 declaration under Section 564(b)(1) of the Act, 21 U.S.C. section 360bbb-3(b)(1), unless the authorization is terminated or revoked sooner. Performed at [facility], [address] [place], [place] [address]   MRSA PCR Screening     Status: None   Collection  Time: 05/28/19 10:36 AM   Specimen: Nasal Mucosa; Nasopharyngeal  Result Value Ref Range Status   MRSA by PCR NEGATIVE NEGATIVE Final    Comment:        The GeneXpert MRSA Assay (FDA approved for NASAL specimens only), is one component of a comprehensive MRSA colonization surveillance program. It is not intended to diagnose MRSA infection nor to guide or monitor treatment for MRSA infections. Performed at [facility], [address] [place], [place] [address]     RADIOLOGY:  No results found.  EKG:   Orders placed or performed during the hospital encounter of 05/28/19  . EKG 12-Lead  . EKG 12-Lead  . EKG 12-Lead  . EKG 12-Lead      Management plans discussed with the patient, he is  in agreement.  CODE STATUS:     Code Status Orders  (From admission, onward)         Start     Ordered   05/28/19 1320  Full code  Continuous     05/28/19 1319        Code Status History    Date Active Date Inactive Code Status Order ID Comments User Context   06/12/2015 1039 06/12/2015 1425 Full Code [id number]  [name], MD Inpatient   11/29/2013 1231 12/03/2013 1810 Full Code [id number]  Caryn Bee Inpatient   Advance Care Planning Activity    Advance Directive Documentation     Most Recent Value  Type of Advance Directive  Living will  Pre-existing out of facility DNR order (yellow form or pink MOST form)  -  "MOST" Form in Place?  -      TOTAL TIME TAKING CARE OF THIS PATIENT: 43  minutes.   Note: This dictation was prepared with Dragon dictation along with smaller phrase technology. Any transcriptional errors that result from this process are unintentional.   @M[facility] @  on 06/05/2019 at 3:20 PM  Between 7am to 6pm - Pager - [phone]  After 6pm go to [email or web address] - password [email or web address] [place] [facility]  Office  [phone]  CC: Primary care physician; Mikey College, NP

## 2019-06-05 NOTE — Consult Note (Signed)
ANTICOAGULATION CONSULT NOTE - Initial Consult  Pharmacy Consult for Eliquis Indication: non-valvular atrial fibrillation  Allergies  Allergen Reactions  . Lisinopril     Patient Measurements: Height: 5\' 7"  (170.2 cm) Weight: (unable to weigh pt on bed) IBW/kg (Calculated) : 66.1  Vital Signs: Temp: 98 F (36.7 C) (09/22 0526) Temp Source: Oral (09/22 0526) BP: 137/66 (09/22 0526) Pulse Rate: 68 (09/22 0526)  Labs: Recent Labs    06/03/19 0431 06/03/19 1227 06/04/19 0420 06/05/19 0423  HGB 8.6* 8.4* 8.2* 8.5*  HCT 24.7* 24.7* 24.2* 25.3*  PLT 169  --  163 180  HEPARINUNFRC <0.10*  --   --   --   CREATININE  --   --   --  7.41*    Estimated Creatinine Clearance: 10.4 mL/min (A) (by C-G formula based on SCr of 7.41 mg/dL (H)).   Medical History: Past Medical History:  Diagnosis Date  . (HFpEF) heart failure with preserved ejection fraction ([place])    a. 05/2019 Echo: EF 50-55%, diast dysfxn, RVSP 56.79mmHg, Sev dil LA. Mildly dil PA.  [name] Anemia   . Anxiety   . Arthritis   . Colon polyps   . COPD (chronic obstructive pulmonary disease) ([place])   . Coronary artery disease    a. 10/2013 NSTEMI/Cath: Severe 3VD-->CABG x 4 @ [facility] 01/2014 (LIMA->LAD, VG->OM1->OM2, VG->RCA); b. 11/2014 MV: No isch/infarct.  [name] ESRD (end stage renal disease) ([place])    a. MWFSat HD  . ETOH abuse    a. 2 beers/night.  [name] GERD (gastroesophageal reflux disease)   . History of pneumonia   . Hyperlipidemia   . Hypertension   . IgA nephropathy   . Lower GI bleed    a. Due to colon polyps. Status post resection of 14 polyps  . Non-ST elevation MI (NSTEMI) ([place])   . Rheumatoid arthritis ([place])   . Shortness of breath   . Sleep apnea   . Tobacco abuse     Medications:  No pta anticoagulation  Assessment: 58 y/o M with hx of CHF, ESRD on HD, CAD, HLD, and HTN admitted 9/14 with acute respiratory failure secondary to PNA.   Was in ICU on rate control with carvedilol and rhythm control with  amiodarone. Patient was initially managed with a heparin drip for anticoagulation.  9/20 @ 0431 HL = < 0.10, per RN heparin was turned off due to bleeding after d/w MD  Per cardiology plan is to transition to a apixaban. Patient withCHA2DS2VAScof 5 and will required continued anticoagulation.  Pharmacy is now consulted to start [place] therapy.  Age<80 years and body weight >60 kg  Goal of Therapy:  Monitor platelets by anticoagulation protocol: Yes   Plan:  Will start Eliquis 5mg  bid  Lu Duffel, PharmD, BCPS Clinical Pharmacist 06/05/2019 9:14 AM

## 2019-06-05 NOTE — Discharge Instructions (Signed)
Follow-up with primary care physician in 3 to 4 days Continue hemodialysis on Monday, Wednesday and Friday Follow-up with nephrology in a week Follow-up with cardiology Dr. Rockey Situ in 10 days

## 2019-06-05 NOTE — Progress Notes (Signed)
[place], [place] 06/05/19  Subjective:  Access now declotted. Had hemodialysis yesterday. Tolerated well.   Objective:  Vital signs in last 24 hours:  Temp:  [97.1 F (36.2 C)-98 F (36.7 C)] 98 F (36.7 C) (09/22 0526) Pulse Rate:  [51-68] 68 (09/22 0526) Resp:  [8-21] 20 (09/22 0526) BP: (130-191)/(65-80) 137/66 (09/22 0526) SpO2:  [88 %-99 %] 94 % (09/22 0526) Weight:  [74.3 kg] 74.3 kg (09/21 1321)  Weight change:  Filed Weights   06/02/19 0631 06/04/19 1321  Weight: 74.3 kg 74.3 kg    Intake/Output:    Intake/Output Summary (Last 24 hours) at 06/05/2019 1124 Last data filed at 06/04/2019 1945 Gross per 24 hour  Intake -  Output 2512 ml  Net -2512 ml    Gen:   Alert, cooperative, no distress Head:   Normocephalic, atraumatic Eyes/ENT:  conjunctiva clear,  moist oral mucus membranes Neck:  Supple, no JVD Lungs:   Clear to auscultation normal effort Heart:   Regular rhythm, no, rub or gallop Abdomen:   Soft, non-tender,  BS present Extremities: no cyanosis or edema Neurologic: Alert and oriented, follows commands Access:           Left arm AVG, bruit and thrill present    Basic Metabolic Panel:  Recent Labs  Lab 05/30/19 1249 05/31/19 0446 06/01/19 0547 06/02/19 0424 06/05/19 0423  NA 135 133* 133* 135 135  K 4.4 4.7 4.8 4.2 4.2  CL 96* 96* 97* 98 99  CO2 27 24 24 26 26   GLUCOSE 123* 102* 97 93 88  BUN 40* 51* 61* 32* 29*  CREATININE 6.98* 8.50* 10.17* 6.90* 7.41*  CALCIUM 9.1 8.5* 8.5* 8.7* 8.2*  MG 2.0 2.0  --  1.9  --   PHOS 3.7 4.0  --   --   --      CBC: Recent Labs  Lab 05/31/19 0446 06/01/19 0547 06/02/19 0424 06/02/19 2230 06/03/19 0431 06/03/19 1227 06/04/19 0420 06/05/19 0423  WBC 9.2 7.9 7.6  --  7.9  --  7.6 8.0  NEUTROABS 5.7  --   --   --   --   --   --   --   HGB 9.9* 8.9* 9.0* 8.5* 8.6* 8.4* 8.2* 8.5*  HCT 28.0* 26.0* 26.3*  --  24.7* 24.7* 24.2* 25.3*  MCV 98.6 101.6* 100.0  --   100.8*  --  100.4* 101.2*  PLT 173 161 170  --  169  --  163 180      Lab Results  Component Value Date   HEPBSAG NEGATIVE 11/30/2013      Microbiology:  Recent Results (from the past 240 hour(s))  SARS Coronavirus 2 [facility] order, Performed in [facility] hospital lab) Nasopharyngeal Nasopharyngeal Swab     Status: None   Collection Time: 05/28/19  7:20 AM   Specimen: Nasopharyngeal Swab  Result Value Ref Range Status   SARS Coronavirus 2 NEGATIVE NEGATIVE Final    Comment: (NOTE) If result is NEGATIVE SARS-CoV-2 target nucleic acids are NOT DETECTED. The SARS-CoV-2 RNA is generally detectable in upper and lower  respiratory specimens during the acute phase of infection. The lowest  concentration of SARS-CoV-2 viral copies this assay can detect is 250  copies / mL. A negative result does not preclude SARS-CoV-2 infection  and should not be used as the sole basis for treatment or other  patient management decisions.  A negative result may occur with  improper specimen collection /  handling, submission of specimen other  than nasopharyngeal swab, presence of viral mutation(s) within the  areas targeted by this assay, and inadequate number of viral copies  (<250 copies / mL). A negative result must be combined with clinical  observations, patient history, and epidemiological information. If result is POSITIVE SARS-CoV-2 target nucleic acids are DETECTED. The SARS-CoV-2 RNA is generally detectable in upper and lower  respiratory specimens dur ing the acute phase of infection.  Positive  results are indicative of active infection with SARS-CoV-2.  Clinical  correlation with patient history and other diagnostic information is  necessary to determine patient infection status.  Positive results do  not rule out bacterial infection or co-infection with other viruses. If result is PRESUMPTIVE POSTIVE SARS-CoV-2 nucleic acids MAY BE PRESENT.   A presumptive positive result was  obtained on the submitted specimen  and confirmed on repeat testing.  While 2019 novel coronavirus  (SARS-CoV-2) nucleic acids may be present in the submitted sample  additional confirmatory testing may be necessary for epidemiological  and / or clinical management purposes  to differentiate between  SARS-CoV-2 and other Sarbecovirus currently known to infect humans.  If clinically indicated additional testing with an alternate test  methodology [phone]) is advised. The SARS-CoV-2 RNA is generally  detectable in upper and lower respiratory sp ecimens during the acute  phase of infection. The expected result is Negative. Fact Sheet for Patients:  StrictlyIdeas.no Fact Sheet for Healthcare Providers: BankingDealers.co.za This test is not yet approved or cleared by the [place] FDA and has been authorized for detection and/or diagnosis of SARS-CoV-2 by FDA under an Emergency Use Authorization (EUA).  This EUA will remain in effect (meaning this test can be used) for the duration of the COVID-19 declaration under Section 564(b)(1) of the Act, 21 U.S.C. section 360bbb-3(b)(1), unless the authorization is terminated or revoked sooner. Performed at [facility], [address] [place], [place] [address]   MRSA PCR Screening     Status: None   Collection Time: 05/28/19 10:36 AM   Specimen: Nasal Mucosa; Nasopharyngeal  Result Value Ref Range Status   MRSA by PCR NEGATIVE NEGATIVE Final    Comment:        The GeneXpert MRSA Assay (FDA approved for NASAL specimens only), is one component of a comprehensive MRSA colonization surveillance program. It is not intended to diagnose MRSA infection nor to guide or monitor treatment for MRSA infections. Performed at [facility], [address] [place], [place] [address]     Coagulation Studies: No results for input(s): LABPROT, INR in the last 72  hours.  Urinalysis: No results for input(s): COLORURINE, LABSPEC, PHURINE, GLUCOSEU, HGBUR, BILIRUBINUR, KETONESUR, PROTEINUR, UROBILINOGEN, NITRITE, LEUKOCYTESUR in the last 72 hours.  Invalid input(s): APPERANCEUR    Imaging: No results found.   Medications:   . sodium chloride Stopped (06/02/19 0700)   . amiodarone  400 mg Oral BID  . amLODipine  10 mg Oral Daily  . apixaban  5 mg Oral BID  . aspirin EC  81 mg Oral Daily  . atorvastatin  40 mg Oral q1800  . budesonide  0.5 mg Nebulization Q12H  . calcium carbonate  1 tablet Oral BID WC  . carvedilol  25 mg Oral BID WC  . Chlorhexidine Gluconate Cloth  6 each Topical Q0600  . cinacalcet  30 mg Oral Q supper  . cloNIDine  0.2 mg Oral TID  . epoetin (EPOGEN/PROCRIT) injection  4,000 Units Intravenous Q M,W,F-HD  .  furosemide  80 mg Oral BID  . gabapentin  100 mg Oral TID  . hydrALAZINE  100 mg Oral Q6H  . influenza vac split quadrivalent PF  0.5 mL Intramuscular Tomorrow-1000  . irbesartan  300 mg Oral QHS  . loratadine  10 mg Oral Daily  . multivitamin  1 tablet Oral Daily  . nicotine  21 mg Transdermal Daily  . sevelamer carbonate  2,400 mg Oral Daily  . tiotropium  18 mcg Inhalation QPM  . zolpidem  10 mg Oral QHS   sodium chloride, acetaminophen, albuterol, diphenhydrAMINE, docusate sodium, hydrALAZINE, metoprolol tartrate, nitroGLYCERIN, ondansetron (ZOFRAN) IV  Assessment/ Plan:  58 y.o. male with  ESRD, CAD/ CABG 2015, CHF, sleep apnea, alcohol abuse, current smoker, history of chronic lacunar infarcts within left frontal lobe white matter and left basal ganglia  [facility] MWF-1// TW 70kg   Active Problems:   HCAP (healthcare-associated pneumonia)   #. ESRD with hyperkalemia Patient completed dialysis yesterday.  Tolerated well.  No bleeding from access noted at moment.  #. Anemia of CKD  Lab Results  Component Value Date   HGB 8.5 (L) 06/05/2019  -Patient to be maintained on  Epogen as an outpatient.   #. SHPTH  No results found for: PTH Lab Results  Component Value Date   PHOS 4.0 05/31/2019  Phosphorus under reasonable control at 4.0.  Maintain the patient on Renvela.  #. HTN  Maintain the patient on amlodipine, carvedilol, clonidine, hydralazine, irbesartan  # Pneumonia -Treatment as per primary team.  # A Fib with RVR Stress test negative for ischemia Converted to sinus with amiodarone Evaluated by cardiologist, Dr. Rockey Situ Defer anticoagulation to hospitalist or cardiology.    LOS: 8 Daryl Weeks 9/22/202011:24 [place], [place]

## 2019-06-05 NOTE — Progress Notes (Signed)
Nsg Discharge Note  Admit Date:  05/28/2019 Discharge date: 06/05/2019   Hyman Bower to be D/C'd Home per MD order.  AVS completed.  Copy for chart, and copy for patient signed, and dated. Patient/caregiver able to verbalize understanding.  Discharge Medication: Allergies as of 06/05/2019      Reactions   Lisinopril       Medication List    STOP taking these medications   lidocaine-prilocaine cream Commonly known as: EMLA   metoprolol tartrate 100 MG tablet Commonly known as: LOPRESSOR     TAKE these medications   Ambien 10 MG tablet Generic drug: zolpidem Take 10 mg by mouth at bedtime.   amiodarone 400 MG tablet Commonly known as: PACERONE Take 1 tablet (400 mg total) by mouth 2 (two) times daily.   amLODipine 10 MG tablet Commonly known as: NORVASC take 1 tablet by mouth once daily   apixaban 5 MG Tabs tablet Commonly known as: ELIQUIS Take 1 tablet (5 mg total) by mouth 2 (two) times daily.   aspirin EC 81 MG tablet Take 1 tablet (81 mg total) by mouth daily.   atorvastatin 20 MG tablet Commonly known as: LIPITOR take 1 tablet by mouth at bedtime   Auryxia 1 GM 210 MG(Fe) tablet Generic drug: ferric citrate TK 1 T PO  TID BEFORE EACH MEAL   calcium carbonate 500 MG chewable tablet Commonly known as: TUMS - dosed in mg elemental calcium Chew 1 tablet by mouth as needed.   carvedilol 25 MG tablet Commonly known as: COREG Take 1 tablet (25 mg total) by mouth 2 (two) times daily with a meal.   cloNIDine 0.2 MG tablet Commonly known as: CATAPRES Take 1 tablet (0.2 mg total) by mouth 3 (three) times daily. What changed:   medication strength  See the new instructions.   docusate sodium 100 MG capsule Commonly known as: COLACE Take 1 capsule (100 mg total) by mouth 2 (two) times daily as needed for mild constipation.   furosemide 80 MG tablet Commonly known as: LASIX Take 80 mg by mouth 2 (two) times daily.   gabapentin 100 MG capsule Commonly  known as: NEURONTIN TK 1 C PO TID   hydrALAZINE 100 MG tablet Commonly known as: APRESOLINE Take 1 tablet (100 mg total) by mouth every 6 (six) hours.   irbesartan 300 MG tablet Commonly known as: AVAPRO Take 1 tablet (300 mg total) by mouth at bedtime. What changed:   medication strength  See the new instructions.   irbesartan 300 MG tablet Commonly known as: AVAPRO Take 1 tablet (300 mg total) by mouth at bedtime. What changed: Another medication with the same name was changed. Make sure you understand how and when to take each.   loratadine 10 MG tablet Commonly known as: CLARITIN Take 1 tablet (10 mg total) by mouth daily. Start taking on: June 06, 2019   multivitamin Tabs tablet Take 1 tablet by mouth daily.   nicotine 21 mg/24hr patch Commonly known as: NICODERM CQ - dosed in mg/24 hours Place 1 patch (21 mg total) onto the skin daily.   nitroGLYCERIN 0.4 MG SL tablet Commonly known as: NITROSTAT Place 1 tablet (0.4 mg total) under the tongue every 5 (five) minutes as needed for chest pain.   ondansetron 4 MG tablet Commonly known as: ZOFRAN TK 1 T PO BID PRF NAUSEA   Pulmicort 0.5 MG/2ML nebulizer solution Generic drug: budesonide Take 2 mLs (0.5 mg total) by nebulization every 12 (twelve) hours.  budesonide 180 MCG/ACT inhaler Commonly known as: Pulmicort Flexhaler Inhale 1 puff into the lungs 2 (two) times daily.   Sensipar 30 MG tablet Generic drug: cinacalcet Take 30 mg by mouth daily with supper.   sevelamer carbonate 800 MG tablet Commonly known as: RENVELA Take 2,400 mg by mouth daily.   tiotropium 18 MCG inhalation capsule Commonly known as: SPIRIVA Place 1 capsule (18 mcg total) into inhaler and inhale daily. What changed: when to take this   Ventolin HFA 108 (90 Base) MCG/ACT inhaler Generic drug: albuterol Inhale 2 puffs into the lungs every 4 (four) hours as needed for wheezing or shortness of breath.       Discharge  Assessment: Vitals:   06/05/19 0526 06/05/19 1207  BP: 137/66 138/76  Pulse: 68 (!) 56  Resp: 20   Temp: 98 F (36.7 C) 97.9 F (36.6 C)  SpO2: 94% 97%   Skin clean, dry and intact without evidence of skin break down, no evidence of skin tears noted. IV catheter discontinued intact. Site without signs and symptoms of complications - no redness or edema noted at insertion site, patient denies c/o pain - only slight tenderness at site.  Dressing with slight pressure applied.  D/c Instructions-Education: Discharge instructions given to patient/family with verbalized understanding. D/c education completed with patient/family including follow up instructions, medication list, d/c activities limitations if indicated, with other d/c instructions as indicated by MD - patient able to verbalize understanding, all questions fully answered. Patient instructed to return to ED, call 911, or call MD for any changes in condition.  Patient escorted via [place], and D/C home via private auto.  Eda Keys, RN 06/05/2019 1:59 PM

## 2019-06-05 NOTE — Progress Notes (Signed)
Nsg Discharge Note  Admit Date:  05/28/2019 Discharge date: 06/05/2019   Daryl Weeks to be D/C'd Home per MD order.  AVS completed.  Copy for chart, and copy for patient signed, and dated. Patient/caregiver able to verbalize understanding.  Discharge Medication: Allergies as of 06/05/2019      Reactions   Lisinopril       Medication List    STOP taking these medications   aspirin EC 81 MG tablet   lidocaine-prilocaine cream Commonly known as: EMLA   metoprolol tartrate 100 MG tablet Commonly known as: LOPRESSOR     TAKE these medications   Ambien 10 MG tablet Generic drug: zolpidem Take 10 mg by mouth at bedtime.   amiodarone 400 MG tablet Commonly known as: PACERONE Take 1 tablet (400 mg total) by mouth 2 (two) times daily.   amLODipine 10 MG tablet Commonly known as: NORVASC take 1 tablet by mouth once daily   apixaban 5 MG Tabs tablet Commonly known as: ELIQUIS Take 1 tablet (5 mg total) by mouth 2 (two) times daily.   atorvastatin 20 MG tablet Commonly known as: LIPITOR take 1 tablet by mouth at bedtime   Auryxia 1 GM 210 MG(Fe) tablet Generic drug: ferric citrate TK 1 T PO  TID BEFORE EACH MEAL   calcium carbonate 500 MG chewable tablet Commonly known as: TUMS - dosed in mg elemental calcium Chew 1 tablet by mouth as needed.   carvedilol 25 MG tablet Commonly known as: COREG Take 1 tablet (25 mg total) by mouth 2 (two) times daily with a meal.   cloNIDine 0.2 MG tablet Commonly known as: CATAPRES Take 1 tablet (0.2 mg total) by mouth 3 (three) times daily. What changed:   medication strength  See the new instructions.   docusate sodium 100 MG capsule Commonly known as: COLACE Take 1 capsule (100 mg total) by mouth 2 (two) times daily as needed for mild constipation.   furosemide 80 MG tablet Commonly known as: LASIX Take 80 mg by mouth 2 (two) times daily.   gabapentin 100 MG capsule Commonly known as: NEURONTIN TK 1 C PO TID    hydrALAZINE 100 MG tablet Commonly known as: APRESOLINE Take 1 tablet (100 mg total) by mouth every 6 (six) hours.   irbesartan 300 MG tablet Commonly known as: AVAPRO Take 1 tablet (300 mg total) by mouth at bedtime. What changed:   medication strength  See the new instructions.   irbesartan 300 MG tablet Commonly known as: AVAPRO Take 1 tablet (300 mg total) by mouth at bedtime. What changed: Another medication with the same name was changed. Make sure you understand how and when to take each.   loratadine 10 MG tablet Commonly known as: CLARITIN Take 1 tablet (10 mg total) by mouth daily. Start taking on: June 06, 2019   multivitamin Tabs tablet Take 1 tablet by mouth daily.   nicotine 21 mg/24hr patch Commonly known as: NICODERM CQ - dosed in mg/24 hours Place 1 patch (21 mg total) onto the skin daily.   nitroGLYCERIN 0.4 MG SL tablet Commonly known as: NITROSTAT Place 1 tablet (0.4 mg total) under the tongue every 5 (five) minutes as needed for chest pain.   ondansetron 4 MG tablet Commonly known as: ZOFRAN TK 1 T PO BID PRF NAUSEA   Pulmicort 0.5 MG/2ML nebulizer solution Generic drug: budesonide Take 2 mLs (0.5 mg total) by nebulization every 12 (twelve) hours.   budesonide 180 MCG/ACT inhaler Commonly known as:  Pulmicort Flexhaler Inhale 1 puff into the lungs 2 (two) times daily.   Sensipar 30 MG tablet Generic drug: cinacalcet Take 30 mg by mouth daily with supper.   sevelamer carbonate 800 MG tablet Commonly known as: RENVELA Take 2,400 mg by mouth daily.   tiotropium 18 MCG inhalation capsule Commonly known as: SPIRIVA Place 1 capsule (18 mcg total) into inhaler and inhale daily. What changed: when to take this   Ventolin HFA 108 (90 Base) MCG/ACT inhaler Generic drug: albuterol Inhale 2 puffs into the lungs every 4 (four) hours as needed for wheezing or shortness of breath.       Discharge Assessment: Vitals:   06/05/19 0526  06/05/19 1207  BP: 137/66 138/76  Pulse: 68 (!) 56  Resp: 20   Temp: 98 F (36.7 C) 97.9 F (36.6 C)  SpO2: 94% 97%   Skin clean, dry and intact without evidence of skin break down, no evidence of skin tears noted. IV catheter discontinued intact. Site without signs and symptoms of complications - no redness or edema noted at insertion site, patient denies c/o pain - only slight tenderness at site.  Dressing with slight pressure applied.  D/c Instructions-Education: Discharge instructions given to patient/family with verbalized understanding. D/c education completed with patient/family including follow up instructions, medication list, d/c activities limitations if indicated, with other d/c instructions as indicated by MD - patient able to verbalize understanding, all questions fully answered. Patient instructed to return to ED, call 911, or call MD for any changes in condition.  Patient escorted via [place], and D/C home via private auto.  Eda Keys, RN 06/05/2019 2:08 PM

## 2019-06-06 ENCOUNTER — Telehealth: Payer: Self-pay

## 2019-06-06 DIAGNOSIS — D631 Anemia in chronic kidney disease: Secondary | ICD-10-CM | POA: Diagnosis not present

## 2019-06-06 DIAGNOSIS — N2581 Secondary hyperparathyroidism of renal origin: Secondary | ICD-10-CM | POA: Diagnosis not present

## 2019-06-06 DIAGNOSIS — N186 End stage renal disease: Secondary | ICD-10-CM | POA: Diagnosis not present

## 2019-06-06 DIAGNOSIS — D509 Iron deficiency anemia, unspecified: Secondary | ICD-10-CM | POA: Diagnosis not present

## 2019-06-06 DIAGNOSIS — Z992 Dependence on renal dialysis: Secondary | ICD-10-CM | POA: Diagnosis not present

## 2019-06-06 LAB — HEPATITIS B SURFACE ANTIGEN: Hepatitis B Surface Ag: NEGATIVE

## 2019-06-06 NOTE — Telephone Encounter (Signed)
Transition Care Management Follow-up Telephone Call  Date of discharge and from where: 06/05/2019 [facility]  How have you been since you were released from the hospital? "I am doing pretty good today"  Any questions or concerns? No   Items Reviewed:  Did the pt receive and understand the discharge instructions provided? Yes   Medications obtained and verified? Yes   Any new allergies since your discharge? No   Dietary orders reviewed? Yes  Do you have support at home? Yes   Functional Questionnaire: (I = Independent and D = Dependent) ADLs:   Bathing/Dressing- i  Meal Prep- i  Eating- i  Maintaining continence- i  Transferring/Ambulation- i  Managing Meds- i  Follow up appointments reviewed:   PCP Hospital f/u appt confirmed? Yes  Scheduled to see Lissa Merlin on 06/08/2019 @ [facility] f/u appt confirmed? Yes  Scheduled to see Murray Hodgkins on 06/20/2019 @ 3pm.  Are transportation arrangements needed? No   If their condition worsens, is the pt aware to call PCP or go to the Emergency Dept.? Yes  Was the patient provided with contact information for the PCP's office or ED? Yes  Was to pt encouraged to call back with questions or concerns? Yes

## 2019-06-08 ENCOUNTER — Inpatient Hospital Stay: Payer: Medicare Other | Admitting: Nurse Practitioner

## 2019-06-08 DIAGNOSIS — N2581 Secondary hyperparathyroidism of renal origin: Secondary | ICD-10-CM | POA: Diagnosis not present

## 2019-06-08 DIAGNOSIS — Z992 Dependence on renal dialysis: Secondary | ICD-10-CM | POA: Diagnosis not present

## 2019-06-08 DIAGNOSIS — D509 Iron deficiency anemia, unspecified: Secondary | ICD-10-CM | POA: Diagnosis not present

## 2019-06-08 DIAGNOSIS — D631 Anemia in chronic kidney disease: Secondary | ICD-10-CM | POA: Diagnosis not present

## 2019-06-08 DIAGNOSIS — N186 End stage renal disease: Secondary | ICD-10-CM | POA: Diagnosis not present

## 2019-06-09 DIAGNOSIS — Z992 Dependence on renal dialysis: Secondary | ICD-10-CM | POA: Diagnosis not present

## 2019-06-09 DIAGNOSIS — E8779 Other fluid overload: Secondary | ICD-10-CM | POA: Diagnosis not present

## 2019-06-09 DIAGNOSIS — N186 End stage renal disease: Secondary | ICD-10-CM | POA: Diagnosis not present

## 2019-06-11 DIAGNOSIS — N186 End stage renal disease: Secondary | ICD-10-CM | POA: Diagnosis not present

## 2019-06-11 DIAGNOSIS — D631 Anemia in chronic kidney disease: Secondary | ICD-10-CM | POA: Diagnosis not present

## 2019-06-11 DIAGNOSIS — N2581 Secondary hyperparathyroidism of renal origin: Secondary | ICD-10-CM | POA: Diagnosis not present

## 2019-06-11 DIAGNOSIS — Z992 Dependence on renal dialysis: Secondary | ICD-10-CM | POA: Diagnosis not present

## 2019-06-11 DIAGNOSIS — D509 Iron deficiency anemia, unspecified: Secondary | ICD-10-CM | POA: Diagnosis not present

## 2019-06-13 DIAGNOSIS — D631 Anemia in chronic kidney disease: Secondary | ICD-10-CM | POA: Diagnosis not present

## 2019-06-13 DIAGNOSIS — Z992 Dependence on renal dialysis: Secondary | ICD-10-CM | POA: Diagnosis not present

## 2019-06-13 DIAGNOSIS — N2581 Secondary hyperparathyroidism of renal origin: Secondary | ICD-10-CM | POA: Diagnosis not present

## 2019-06-13 DIAGNOSIS — N186 End stage renal disease: Secondary | ICD-10-CM | POA: Diagnosis not present

## 2019-06-13 DIAGNOSIS — D509 Iron deficiency anemia, unspecified: Secondary | ICD-10-CM | POA: Diagnosis not present

## 2019-06-14 ENCOUNTER — Other Ambulatory Visit: Payer: Self-pay | Admitting: Internal Medicine

## 2019-06-14 DIAGNOSIS — D631 Anemia in chronic kidney disease: Secondary | ICD-10-CM | POA: Diagnosis not present

## 2019-06-14 DIAGNOSIS — E8779 Other fluid overload: Secondary | ICD-10-CM | POA: Diagnosis not present

## 2019-06-14 DIAGNOSIS — N2581 Secondary hyperparathyroidism of renal origin: Secondary | ICD-10-CM | POA: Diagnosis not present

## 2019-06-14 DIAGNOSIS — T82848A Pain from vascular prosthetic devices, implants and grafts, initial encounter: Secondary | ICD-10-CM | POA: Diagnosis not present

## 2019-06-14 DIAGNOSIS — T82898A Other specified complication of vascular prosthetic devices, implants and grafts, initial encounter: Secondary | ICD-10-CM | POA: Diagnosis not present

## 2019-06-14 DIAGNOSIS — Z992 Dependence on renal dialysis: Secondary | ICD-10-CM | POA: Diagnosis not present

## 2019-06-14 DIAGNOSIS — Z23 Encounter for immunization: Secondary | ICD-10-CM | POA: Diagnosis not present

## 2019-06-14 DIAGNOSIS — D509 Iron deficiency anemia, unspecified: Secondary | ICD-10-CM | POA: Diagnosis not present

## 2019-06-14 DIAGNOSIS — N186 End stage renal disease: Secondary | ICD-10-CM | POA: Diagnosis not present

## 2019-06-15 DIAGNOSIS — N186 End stage renal disease: Secondary | ICD-10-CM | POA: Diagnosis not present

## 2019-06-15 DIAGNOSIS — N2581 Secondary hyperparathyroidism of renal origin: Secondary | ICD-10-CM | POA: Diagnosis not present

## 2019-06-15 DIAGNOSIS — T82898A Other specified complication of vascular prosthetic devices, implants and grafts, initial encounter: Secondary | ICD-10-CM | POA: Diagnosis not present

## 2019-06-15 DIAGNOSIS — Z23 Encounter for immunization: Secondary | ICD-10-CM | POA: Diagnosis not present

## 2019-06-15 DIAGNOSIS — D509 Iron deficiency anemia, unspecified: Secondary | ICD-10-CM | POA: Diagnosis not present

## 2019-06-15 DIAGNOSIS — T82848A Pain from vascular prosthetic devices, implants and grafts, initial encounter: Secondary | ICD-10-CM | POA: Diagnosis not present

## 2019-06-17 ENCOUNTER — Other Ambulatory Visit: Payer: Self-pay | Admitting: Nurse Practitioner

## 2019-06-17 DIAGNOSIS — I150 Renovascular hypertension: Secondary | ICD-10-CM

## 2019-06-18 DIAGNOSIS — N186 End stage renal disease: Secondary | ICD-10-CM | POA: Diagnosis not present

## 2019-06-18 DIAGNOSIS — N2581 Secondary hyperparathyroidism of renal origin: Secondary | ICD-10-CM | POA: Diagnosis not present

## 2019-06-18 DIAGNOSIS — D509 Iron deficiency anemia, unspecified: Secondary | ICD-10-CM | POA: Diagnosis not present

## 2019-06-18 DIAGNOSIS — T82848A Pain from vascular prosthetic devices, implants and grafts, initial encounter: Secondary | ICD-10-CM | POA: Diagnosis not present

## 2019-06-18 DIAGNOSIS — T82898A Other specified complication of vascular prosthetic devices, implants and grafts, initial encounter: Secondary | ICD-10-CM | POA: Diagnosis not present

## 2019-06-18 DIAGNOSIS — Z23 Encounter for immunization: Secondary | ICD-10-CM | POA: Diagnosis not present

## 2019-06-20 ENCOUNTER — Encounter: Payer: Self-pay | Admitting: Nurse Practitioner

## 2019-06-20 ENCOUNTER — Ambulatory Visit: Payer: Medicare Other | Admitting: Nurse Practitioner

## 2019-06-20 DIAGNOSIS — N186 End stage renal disease: Secondary | ICD-10-CM | POA: Diagnosis not present

## 2019-06-20 DIAGNOSIS — N2581 Secondary hyperparathyroidism of renal origin: Secondary | ICD-10-CM | POA: Diagnosis not present

## 2019-06-20 DIAGNOSIS — T82848A Pain from vascular prosthetic devices, implants and grafts, initial encounter: Secondary | ICD-10-CM | POA: Diagnosis not present

## 2019-06-20 DIAGNOSIS — Z23 Encounter for immunization: Secondary | ICD-10-CM | POA: Diagnosis not present

## 2019-06-20 DIAGNOSIS — D509 Iron deficiency anemia, unspecified: Secondary | ICD-10-CM | POA: Diagnosis not present

## 2019-06-20 DIAGNOSIS — T82898A Other specified complication of vascular prosthetic devices, implants and grafts, initial encounter: Secondary | ICD-10-CM | POA: Diagnosis not present

## 2019-06-20 NOTE — Progress Notes (Deleted)
Office Visit    Patient Name: Daryl Weeks Date of Encounter: 06/20/2019  Primary Care Provider:  Mikey College, NP Primary Cardiologist:  Kathlyn Sacramento, MD  Chief Complaint    58 year old male with a history of CAD status post four-vessel bypass in 2015, end-stage renal disease on dialysis 4 days a week, hypertension, hyperlipidemia, HFpEF (EF 63 to 55% September 2020), anemia of chronic disease, anxiety, arthritis, sleep apnea, and ongoing tobacco and alcohol abuse, who presents for follow-up after recent hospitalization for hypertensive urgency, slurred speech, possible pneumonia, and A. fib with RVR.  Past Medical History    Past Medical History:  Diagnosis Date   (HFpEF) heart failure with preserved ejection fraction ([place])    a. 05/2019 Echo: EF 50-55%, diast dysfxn, RVSP 56.76mmHg, Sev dil LA. Mildly dil PA.   Anemia    Anxiety    Arthritis    Colon polyps    COPD (chronic obstructive pulmonary disease) (HCC)    Coronary artery disease    a. 10/2013 NSTEMI/Cath: Severe 3VD-->CABG x 4 @ [facility] 01/2014 (LIMA->LAD, VG->OM1->OM2, VG->RCA); b. 11/2014 MV: No isch/infarct; c. 05/2019 MV: EF 40% (50-55 by echo), small,mild, fixed apical defect - ? atten vs infarct. No ischemia->low risk.   ESRD (end stage renal disease) ([place])    a. MWFSat HD   ETOH abuse    a. 2 beers/night.   GERD (gastroesophageal reflux disease)    History of pneumonia    Hyperlipidemia    Hypertension    IgA nephropathy    Lower GI bleed    a. Due to colon polyps. Status post resection of 14 polyps   Non-ST elevation MI (NSTEMI) (HCC)    PAF (paroxysmal atrial fibrillation) ([place])    a. Dx 05/2019. CHA2DS2VASc = 3-->Eliquis/Amio.   Rheumatoid arthritis (HCC)    Shortness of breath    Sleep apnea    Tobacco abuse    Past Surgical History:  Procedure Laterality Date   A/V FISTULAGRAM Left 10/28/2016   Procedure: A/V Fistulagram;  Surgeon: [name], MD;  Location: [facility];  Service: Cardiovascular;  Laterality: Left;   A/V FISTULAGRAM Left 12/07/2018   Procedure: A/V FISTULAGRAM;  Surgeon: [name], MD;  Location: [facility];  Service: Cardiovascular;  Laterality: Left;   A/V FISTULAGRAM N/A 06/04/2019   Procedure: A/V Fistulagram- LEFT;  Surgeon: [name], MD;  Location: [facility];  Service: Cardiovascular;  Laterality: N/A;   A/V SHUNT INTERVENTION N/A 10/28/2016   Procedure: A/V Shunt Intervention;  Surgeon: [name], MD;  Location: [facility];  Service: Cardiovascular;  Laterality: N/A;   A/V SHUNTOGRAM Left 02/01/2019   Procedure: A/V SHUNTOGRAM;  Surgeon: [name], MD;  Location: [facility];  Service: Cardiovascular;  Laterality: Left;   AV FISTULA PLACEMENT     CARDIAC CATHETERIZATION     RCA 90% and calcified mid LAD 80% Stenosis   CORONARY ARTERY BYPASS GRAFT N/A 11/29/2013   Procedure: CORONARY ARTERY BYPASS GRAFTING (CABG) x 4 using endoscopically harvested right saphenous vein and left internal mammary artery;  Surgeon: [name], MD;  Location: [facility];  Service: Open Heart Surgery;  Laterality: N/A;   dialysis catheterr  2/15   DIALYSIS/PERMA CATHETER REMOVAL N/A 02/15/2019   Procedure: DIALYSIS/PERMA CATHETER REMOVAL;  Surgeon: [name], MD;  Location: [facility];  Service: Cardiovascular;  Laterality: N/A;   INSERTION OF DIALYSIS CATHETER  N/A 12/14/2018   Procedure: INSERTION OF DIALYSIS CATHETER ( PERMCATH );  Surgeon: [name], MD;  Location: [facility];  Service: Vascular;  Laterality: N/A;   INTRAOPERATIVE TRANSESOPHAGEAL ECHOCARDIOGRAM N/A 11/29/2013   Procedure: INTRAOPERATIVE TRANSESOPHAGEAL ECHOCARDIOGRAM;  Surgeon: [name], MD;  Location: [facility];  Service: Open Heart Surgery;  Laterality: N/A;   PERIPHERAL VASCULAR CATHETERIZATION N/A 06/12/2015   Procedure: A/V Shuntogram/Fistulagram;  Surgeon: [name], MD;  Location: [facility];  Service: Cardiovascular;  Laterality: N/A;   PERIPHERAL VASCULAR CATHETERIZATION Left 06/12/2015   Procedure: A/V Shunt Intervention;  Surgeon: [name], MD;  Location: [facility];  Service: Cardiovascular;  Laterality: Left;   RENAL BIOPSY Left 14   REVISON OF ARTERIOVENOUS FISTULA Left 12/14/2018   Procedure: REVISON OF ARTERIOVENOUS FISTULA;  Surgeon: [name], MD;  Location: [facility];  Service: Vascular;  Laterality: Left;    Allergies  Allergies  Allergen Reactions   Lisinopril     History of Present Illness    58 year old male with the above complex past medical history including CAD, end-stage renal disease, hypertension, hyperlipidemia, HFpEF, anemia of chronic disease, anxiety, arthritis, sleep apnea, and ongoing tobacco and alcohol abuse.  In 2015, he suffered a non-STEMI with catheterization revealing severe multivessel disease.  Echo showed normal LV function at that time.  He was subsequently referred to CT surgery and underwent CABG x4 in May 2015.  He had a nonischemic stress test in 2016 and had more or less been lost to follow-up since October 2018.    He was admitted to [facility] in early September due to unsteadiness, recurrent falls, slurred speech, and hypertensive urgency.  Chest x-ray was concerning for pneumonia.  Head CT was negative for acute findings.  MRI did not show any acute infarcts though chronic lacunar infarcts were noted in the left frontal lobe white matter and left basal ganglia.  Echo showed an EF of 50 to 55% with elevated right ventricular systolic pressure of 56.  During hospitalization, he was receiving dialysis on September 16 and developed A. fib with RVR with rates in the 130s to 150s.  He was placed on diltiazem drip and heparin and later oral amiodarone with subsequent conversion to sinus rhythm.  Heparin was changed to apixaban.  As he reported intermittent chest pain leading up to admission and he had very mild  high-sensitivity troponin elevation to 42, stress testing was undertaken and showed a small mild fixed apical perfusion defect without ischemia.  This was felt to be low risk.  He remained hospitalized for several more days as he required a PTA of his dialysis fistula on September 21, and was discharged on September 22.  Home Medications    Prior to Admission medications   Medication Sig Start Date End Date Taking? Authorizing Provider  albuterol (VENTOLIN HFA) 108 (90 Base) MCG/ACT inhaler Inhale 2 puffs into the lungs every 4 (four) hours as needed for wheezing or shortness of breath.     [provider]  amiodarone (PACERONE) 400 MG tablet Take 0.5 tablets (200 mg total) by mouth 2 (two) times daily. 06/05/19   Nicholes Mango, MD  amLODipine (NORVASC) 10 MG tablet take 1 tablet by mouth once daily 06/29/16   Wellington Hampshire, MD  apixaban (ELIQUIS) 5 MG TABS tablet Take 1 tablet (5 mg total) by mouth 2 (two) times daily. 06/05/19   Gouru, Illene Silver, MD  atorvastatin (LIPITOR) 20 MG tablet take 1  tablet by mouth at bedtime 04/25/14   Minna Merritts, MD  AURYXIA 1 GM 210 MG(Fe) tablet TK 1 T PO  TID BEFORE [facility] MEAL 08/24/17   [provider]  budesonide (PULMICORT FLEXHALER) 180 MCG/ACT inhaler Inhale 1 puff into the lungs 2 (two) times daily. 02/21/18   Mikey College, NP  budesonide (PULMICORT) 0.5 MG/2ML nebulizer solution Take 2 mLs (0.5 mg total) by nebulization every 12 (twelve) hours. 06/03/17   Laverle Hobby, MD  calcium carbonate (TUMS - DOSED IN MG ELEMENTAL CALCIUM) 500 MG chewable tablet Chew 1 tablet by mouth as needed.     [provider]  carvedilol (COREG) 25 MG tablet Take 1 tablet (25 mg total) by mouth 2 (two) times daily with a meal. 06/05/19   Gouru, Aruna, MD  cinacalcet (SENSIPAR) 30 MG tablet Take 30 mg by mouth daily with supper.     [provider]  cloNIDine (CATAPRES) 0.2 MG tablet Take 1 tablet (0.2 mg total) by mouth 3  (three) times daily. 06/05/19   Nicholes Mango, MD  docusate sodium (COLACE) 100 MG capsule Take 1 capsule (100 mg total) by mouth 2 (two) times daily as needed for mild constipation. 06/05/19   Nicholes Mango, MD  furosemide (LASIX) 80 MG tablet Take 80 mg by mouth 2 (two) times daily.     [provider]  gabapentin (NEURONTIN) 100 MG capsule TK 1 C PO TID 01/14/18   [provider]  hydrALAZINE (APRESOLINE) 100 MG tablet Take 1 tablet (100 mg total) by mouth every 6 (six) hours. 06/05/19   Gouru, Illene Silver, MD  irbesartan (AVAPRO) 300 MG tablet Take 1 tablet (300 mg total) by mouth at bedtime. 06/05/19   Gouru, Illene Silver, MD  irbesartan (AVAPRO) 300 MG tablet Take 1 tablet (300 mg total) by mouth at bedtime. 06/05/19   Nicholes Mango, MD  loratadine (CLARITIN) 10 MG tablet Take 1 tablet (10 mg total) by mouth daily. 06/06/19   Nicholes Mango, MD  multivitamin (RENA-VIT) TABS tablet Take 1 tablet by mouth daily.  05/22/18   [provider]  nicotine (NICODERM CQ - DOSED IN MG/24 HOURS) 21 mg/24hr patch Place 1 patch (21 mg total) onto the skin daily. 06/05/19   Nicholes Mango, MD  nitroGLYCERIN (NITROSTAT) 0.4 MG SL tablet Place 1 tablet (0.4 mg total) under the tongue every 5 (five) minutes as needed for chest pain. 06/05/19   Gouru, Illene Silver, MD  ondansetron (ZOFRAN) 4 MG tablet TK 1 T PO BID PRF NAUSEA 06/14/18   [provider]  sevelamer carbonate (RENVELA) 800 MG tablet Take 2,400 mg by mouth daily.     [provider]  tiotropium (SPIRIVA) 18 MCG inhalation capsule Place 1 capsule (18 mcg total) into inhaler and inhale daily. Patient taking differently: Place 18 mcg into inhaler and inhale every evening.  05/24/17   Laverle Hobby, MD  zolpidem (AMBIEN) 10 MG tablet Take 10 mg by mouth at bedtime.     [provider]    Review of Systems    ***.  All other systems reviewed and are otherwise negative except as noted above.  Physical Exam    VS:  There were no  vitals taken for this visit. , BMI There is no height or weight on file to calculate BMI. GEN: Well nourished, well developed, in no acute distress. HEENT: normal. Neck: Supple, no JVD, carotid bruits, or masses. Cardiac: RRR, no murmurs, rubs, or gallops. No clubbing, cyanosis, edema.  Radials/DP/PT  2+ and equal bilaterally.  Respiratory:  Respirations regular and unlabored, clear to auscultation bilaterally. GI: Soft, nontender, nondistended, BS + x 4. MS: no deformity or atrophy. Skin: warm and dry, no rash. Neuro:  Strength and sensation are intact. Psych: Normal affect.  Accessory Clinical Findings    ECG personally reviewed by me today - *** - no acute changes.  Lab Results  Component Value Date   WBC 8.0 06/05/2019   HGB 8.5 (L) 06/05/2019   HCT 25.3 (L) 06/05/2019   MCV 101.2 (H) 06/05/2019   PLT 180 06/05/2019   Lab Results  Component Value Date   CREATININE 7.41 (H) 06/05/2019   BUN 29 (H) 06/05/2019   NA 135 06/05/2019   K 4.2 06/05/2019   CL 99 06/05/2019   CO2 26 06/05/2019   Lab Results  Component Value Date   ALT 10 05/30/2019   AST 13 (L) 05/30/2019   ALKPHOS 115 05/30/2019   BILITOT 1.0 05/30/2019   Lab Results  Component Value Date   CHOL 137 05/29/2019   HDL 72 05/29/2019   LDLCALC 54 05/29/2019   TRIG 55 05/29/2019   CHOLHDL 1.9 05/29/2019     Assessment & Plan    1.  ***   Murray Hodgkins, NP 06/20/2019, 2:02 PM

## 2019-06-22 DIAGNOSIS — T82898A Other specified complication of vascular prosthetic devices, implants and grafts, initial encounter: Secondary | ICD-10-CM | POA: Diagnosis not present

## 2019-06-22 DIAGNOSIS — T82848A Pain from vascular prosthetic devices, implants and grafts, initial encounter: Secondary | ICD-10-CM | POA: Diagnosis not present

## 2019-06-22 DIAGNOSIS — N186 End stage renal disease: Secondary | ICD-10-CM | POA: Diagnosis not present

## 2019-06-22 DIAGNOSIS — N2581 Secondary hyperparathyroidism of renal origin: Secondary | ICD-10-CM | POA: Diagnosis not present

## 2019-06-22 DIAGNOSIS — Z23 Encounter for immunization: Secondary | ICD-10-CM | POA: Diagnosis not present

## 2019-06-22 DIAGNOSIS — D509 Iron deficiency anemia, unspecified: Secondary | ICD-10-CM | POA: Diagnosis not present

## 2019-06-25 DIAGNOSIS — N186 End stage renal disease: Secondary | ICD-10-CM | POA: Diagnosis not present

## 2019-06-25 DIAGNOSIS — N2581 Secondary hyperparathyroidism of renal origin: Secondary | ICD-10-CM | POA: Diagnosis not present

## 2019-06-25 DIAGNOSIS — T82898A Other specified complication of vascular prosthetic devices, implants and grafts, initial encounter: Secondary | ICD-10-CM | POA: Diagnosis not present

## 2019-06-25 DIAGNOSIS — Z23 Encounter for immunization: Secondary | ICD-10-CM | POA: Diagnosis not present

## 2019-06-25 DIAGNOSIS — T82848A Pain from vascular prosthetic devices, implants and grafts, initial encounter: Secondary | ICD-10-CM | POA: Diagnosis not present

## 2019-06-25 DIAGNOSIS — D509 Iron deficiency anemia, unspecified: Secondary | ICD-10-CM | POA: Diagnosis not present

## 2019-06-26 DIAGNOSIS — N186 End stage renal disease: Secondary | ICD-10-CM | POA: Diagnosis not present

## 2019-06-26 DIAGNOSIS — I871 Compression of vein: Secondary | ICD-10-CM | POA: Diagnosis not present

## 2019-06-26 DIAGNOSIS — T82858A Stenosis of vascular prosthetic devices, implants and grafts, initial encounter: Secondary | ICD-10-CM | POA: Diagnosis not present

## 2019-06-27 DIAGNOSIS — T82848A Pain from vascular prosthetic devices, implants and grafts, initial encounter: Secondary | ICD-10-CM | POA: Diagnosis not present

## 2019-06-27 DIAGNOSIS — Z23 Encounter for immunization: Secondary | ICD-10-CM | POA: Diagnosis not present

## 2019-06-27 DIAGNOSIS — D509 Iron deficiency anemia, unspecified: Secondary | ICD-10-CM | POA: Diagnosis not present

## 2019-06-27 DIAGNOSIS — N186 End stage renal disease: Secondary | ICD-10-CM | POA: Diagnosis not present

## 2019-06-27 DIAGNOSIS — T82898A Other specified complication of vascular prosthetic devices, implants and grafts, initial encounter: Secondary | ICD-10-CM | POA: Diagnosis not present

## 2019-06-27 DIAGNOSIS — N2581 Secondary hyperparathyroidism of renal origin: Secondary | ICD-10-CM | POA: Diagnosis not present

## 2019-06-29 DIAGNOSIS — N186 End stage renal disease: Secondary | ICD-10-CM | POA: Diagnosis not present

## 2019-06-29 DIAGNOSIS — D509 Iron deficiency anemia, unspecified: Secondary | ICD-10-CM | POA: Diagnosis not present

## 2019-06-29 DIAGNOSIS — T82848A Pain from vascular prosthetic devices, implants and grafts, initial encounter: Secondary | ICD-10-CM | POA: Diagnosis not present

## 2019-06-29 DIAGNOSIS — Z23 Encounter for immunization: Secondary | ICD-10-CM | POA: Diagnosis not present

## 2019-06-29 DIAGNOSIS — N2581 Secondary hyperparathyroidism of renal origin: Secondary | ICD-10-CM | POA: Diagnosis not present

## 2019-06-29 DIAGNOSIS — T82898A Other specified complication of vascular prosthetic devices, implants and grafts, initial encounter: Secondary | ICD-10-CM | POA: Diagnosis not present

## 2019-06-30 ENCOUNTER — Emergency Department
Admission: EM | Admit: 2019-06-30 | Discharge: 2019-06-30 | Disposition: A | Discharge status: Home / Self Care | Payer: Medicare Other | Attending: Student | Admitting: Student

## 2019-06-30 ENCOUNTER — Other Ambulatory Visit: Payer: Self-pay

## 2019-06-30 ENCOUNTER — Encounter: Payer: Self-pay | Admitting: Emergency Medicine

## 2019-06-30 DIAGNOSIS — I2510 Atherosclerotic heart disease of native coronary artery without angina pectoris: Secondary | ICD-10-CM | POA: Diagnosis not present

## 2019-06-30 DIAGNOSIS — F17210 Nicotine dependence, cigarettes, uncomplicated: Secondary | ICD-10-CM | POA: Diagnosis not present

## 2019-06-30 DIAGNOSIS — T82838A Hemorrhage of vascular prosthetic devices, implants and grafts, initial encounter: Secondary | ICD-10-CM | POA: Diagnosis not present

## 2019-06-30 DIAGNOSIS — Y69 Unspecified misadventure during surgical and medical care: Secondary | ICD-10-CM | POA: Diagnosis not present

## 2019-06-30 DIAGNOSIS — Z438 Encounter for attention to other artificial openings: Secondary | ICD-10-CM | POA: Diagnosis present

## 2019-06-30 DIAGNOSIS — J449 Chronic obstructive pulmonary disease, unspecified: Secondary | ICD-10-CM | POA: Insufficient documentation

## 2019-06-30 DIAGNOSIS — R58 Hemorrhage, not elsewhere classified: Secondary | ICD-10-CM

## 2019-06-30 DIAGNOSIS — Z7901 Long term (current) use of anticoagulants: Secondary | ICD-10-CM | POA: Insufficient documentation

## 2019-06-30 DIAGNOSIS — Z79899 Other long term (current) drug therapy: Secondary | ICD-10-CM | POA: Insufficient documentation

## 2019-06-30 DIAGNOSIS — N186 End stage renal disease: Secondary | ICD-10-CM | POA: Diagnosis not present

## 2019-06-30 DIAGNOSIS — Z951 Presence of aortocoronary bypass graft: Secondary | ICD-10-CM | POA: Diagnosis not present

## 2019-06-30 DIAGNOSIS — I5032 Chronic diastolic (congestive) heart failure: Secondary | ICD-10-CM | POA: Insufficient documentation

## 2019-06-30 DIAGNOSIS — Z992 Dependence on renal dialysis: Secondary | ICD-10-CM | POA: Diagnosis not present

## 2019-06-30 DIAGNOSIS — I132 Hypertensive heart and chronic kidney disease with heart failure and with stage 5 chronic kidney disease, or end stage renal disease: Secondary | ICD-10-CM | POA: Diagnosis not present

## 2019-06-30 MED ORDER — TRANEXAMIC ACID 1000 MG/10ML IV SOLN
500.0000 mg | Freq: Once | INTRAVENOUS | Status: AC
  Administered 2019-06-30: 11:00:00 500 mg via TOPICAL
  Filled 2019-06-30: qty 10

## 2019-06-30 NOTE — ED Notes (Addendum)
Pt reports bleeding from dialysis site that started about a week ago. Site is swollen, pt states swelling has been present and has improved since taking an antibiotic. Bleeding started after site was accessed because it was occluded. Pt takes eliquis.

## 2019-06-30 NOTE — Discharge Instructions (Addendum)
Skip your next dose of Eliquis (apixaban) this afternoon/evening.  You can resume your normal schedule tomorrow morning.  Please follow-up with your primary care doctor to review your ER visit and follow-up on your symptoms.  Please continue to follow your dialysis schedule.  Please return to the emergency department for any new or worsening symptoms or recurrence of your bleeding.

## 2019-06-30 NOTE — ED Triage Notes (Signed)
Bleeding from dialysis site after treatment this am. Bleeding noted through gauze over access areas L arm.

## 2019-06-30 NOTE — ED Notes (Signed)
Pt states he feels well enough to drive.

## 2019-06-30 NOTE — ED Provider Notes (Signed)
[facility] Emergency Department Provider Note  ____________________________________________   First MD Initiated Contact with Patient 06/30/19 [phone]     (approximate)  I have reviewed the triage vital signs and the nursing notes.  History  Chief Complaint bleeding from dialysis site    HPI DELLIS ALYEA is a 58 y.o. male with a history of ESRD (MWFSat HD) via LEFT forearm AV fistula who presents from dialysis today for bleeding at his fistula site.  Patient states after dialysis on Friday, he has had slow continuous oozing/bleeding from the 2 needle access points, as well as from a suture site from a recent declotting procedure that was done on Tuesday.  Dialysis was unable to run his session today because of this, and therefore advised evaluation in the emergency department.  He is on anticoagulation, Eliquis, took his dose this AM.  He denies any lightheadedness, chest pain, shortness of breath, or syncope with regards to his bleeding.   Past Medical Hx Past Medical History:  Diagnosis Date  . (HFpEF) heart failure with preserved ejection fraction ([place])    a. 05/2019 Echo: EF 50-55%, diast dysfxn, RVSP 56.64mmHg, Sev dil LA. Mildly dil PA.  [name] Anemia   . Anxiety   . Arthritis   . Colon polyps   . COPD (chronic obstructive pulmonary disease) ([place])   . Coronary artery disease    a. 10/2013 NSTEMI/Cath: Severe 3VD-->CABG x 4 @ [facility] 01/2014 (LIMA->LAD, VG->OM1->OM2, VG->RCA); b. 11/2014 MV: No isch/infarct; c. 05/2019 MV: EF 40% (50-55 by echo), small,mild, fixed apical defect - ? atten vs infarct. No ischemia->low risk.  . ESRD (end stage renal disease) ([place])    a. MWFSat HD  . ETOH abuse    a. 2 beers/night.  [name] GERD (gastroesophageal reflux disease)   . History of pneumonia   . Hyperlipidemia   . Hypertension   . IgA nephropathy   . Lower GI bleed    a. Due to colon polyps. Status post resection of 14 polyps  . Non-ST elevation MI (NSTEMI) ([place])   . PAF  (paroxysmal atrial fibrillation) ([place])    a. Dx 05/2019. CHA2DS2VASc = 3-->Eliquis/Amio.  [name] Rheumatoid arthritis ([place])   . Shortness of breath   . Sleep apnea   . Tobacco abuse     Problem List Patient Active Problem List   Diagnosis Date Noted  . HCAP (healthcare-associated pneumonia) 05/28/2019  . Complication of vascular access for dialysis 11/28/2018  . COPD (chronic obstructive pulmonary disease) ([place]) 12/05/2017  . ESRD on dialysis ([place]) 10/05/2016  . IgA nephropathy 11/05/2014  . S/P CABG x 4 11/29/2013  . Chronic diastolic heart failure ([place]) 11/16/2013  . Coronary artery disease   . Hyperlipidemia   . Essential hypertension   . Dyspnea 11/20/2012  . Tobacco abuse     Past Surgical Hx Past Surgical History:  Procedure Laterality Date  . A/V FISTULAGRAM Left 10/28/2016   Procedure: A/V Fistulagram;  Surgeon: [name], MD;  Location: [place] [facility];  Service: Cardiovascular;  Laterality: Left;  . A/V FISTULAGRAM Left 12/07/2018   Procedure: A/V FISTULAGRAM;  Surgeon: [name], MD;  Location: [place] [facility];  Service: Cardiovascular;  Laterality: Left;  . A/V FISTULAGRAM N/A 06/04/2019   Procedure: A/V Fistulagram- LEFT;  Surgeon: [name], MD;  Location: [place] [facility];  Service: Cardiovascular;  Laterality: N/A;  . A/V SHUNT INTERVENTION N/A 10/28/2016   Procedure: A/V Shunt Intervention;  Surgeon: Erskine Squibb  Lucky Cowboy, MD;  Location: [place] [facility];  Service: Cardiovascular;  Laterality: N/A;  . A/V SHUNTOGRAM Left 02/01/2019   Procedure: A/V SHUNTOGRAM;  Surgeon: [name], MD;  Location: [place] [facility];  Service: Cardiovascular;  Laterality: Left;  . AV FISTULA PLACEMENT    . CARDIAC CATHETERIZATION     RCA 90% and calcified mid LAD 80% Stenosis  . CORONARY ARTERY BYPASS GRAFT N/A 11/29/2013   Procedure: CORONARY ARTERY BYPASS GRAFTING (CABG) x 4 using endoscopically harvested right saphenous vein and left internal mammary artery;   Surgeon: [name], MD;  Location: [place] [facility];  Service: Open Heart Surgery;  Laterality: N/A;  . dialysis catheterr  2/15  . DIALYSIS/PERMA CATHETER REMOVAL N/A 02/15/2019   Procedure: DIALYSIS/PERMA CATHETER REMOVAL;  Surgeon: [name], MD;  Location: [place] [facility];  Service: Cardiovascular;  Laterality: N/A;  . INSERTION OF DIALYSIS CATHETER N/A 12/14/2018   Procedure: INSERTION OF DIALYSIS CATHETER ( [place] );  Surgeon: [name], MD;  Location: [facility];  Service: Vascular;  Laterality: N/A;  . INTRAOPERATIVE TRANSESOPHAGEAL ECHOCARDIOGRAM N/A 11/29/2013   Procedure: INTRAOPERATIVE TRANSESOPHAGEAL ECHOCARDIOGRAM;  Surgeon: [name], MD;  Location: [place] [facility];  Service: Open Heart Surgery;  Laterality: N/A;  . PERIPHERAL VASCULAR CATHETERIZATION N/A 06/12/2015   Procedure: A/V Shuntogram/Fistulagram;  Surgeon: [name], MD;  Location: [place] [facility];  Service: Cardiovascular;  Laterality: N/A;  . PERIPHERAL VASCULAR CATHETERIZATION Left 06/12/2015   Procedure: A/V Shunt Intervention;  Surgeon: [name], MD;  Location: [place] [facility];  Service: Cardiovascular;  Laterality: Left;  . RENAL BIOPSY Left 14  . REVISON OF ARTERIOVENOUS FISTULA Left 12/14/2018   Procedure: REVISON OF ARTERIOVENOUS FISTULA;  Surgeon: [name], MD;  Location: [facility];  Service: Vascular;  Laterality: Left;    Medications Prior to Admission medications   Medication Sig Start Date End Date Taking? Authorizing Provider  albuterol (VENTOLIN HFA) 108 (90 Base) MCG/ACT inhaler Inhale 2 puffs into the lungs every 4 (four) hours as needed for wheezing or shortness of breath.     [provider]  amiodarone (PACERONE) 400 MG tablet Take 0.5 tablets (200 mg total) by mouth 2 (two) times daily. 06/05/19   Nicholes Mango, MD  amLODipine (NORVASC) 10 MG tablet take 1 tablet by mouth once daily 06/29/16   Wellington Hampshire, MD  apixaban (ELIQUIS) 5 MG TABS tablet Take 1 tablet (5 mg total) by  mouth 2 (two) times daily. 06/05/19   Nicholes Mango, MD  atorvastatin (LIPITOR) 20 MG tablet take 1 tablet by mouth at bedtime 04/25/14   Minna Merritts, MD  AURYXIA 1 GM 210 MG(Fe) tablet TK 1 T PO  TID BEFORE [facility] MEAL 08/24/17   [provider]  budesonide (PULMICORT FLEXHALER) 180 MCG/ACT inhaler Inhale 1 puff into the lungs 2 (two) times daily. 02/21/18   Mikey College, NP  budesonide (PULMICORT) 0.5 MG/2ML nebulizer solution Take 2 mLs (0.5 mg total) by nebulization every 12 (twelve) hours. 06/03/17   Laverle Hobby, MD  calcium carbonate (TUMS - DOSED IN MG ELEMENTAL CALCIUM) 500 MG chewable tablet Chew 1 tablet by mouth as needed.     [provider]  carvedilol (COREG) 25 MG tablet Take 1 tablet (25 mg total) by mouth 2 (two) times daily with a meal. 06/05/19   Gouru, Aruna, MD  cinacalcet (SENSIPAR) 30 MG tablet Take 30 mg by mouth daily with supper.  [provider]  cloNIDine (CATAPRES) 0.2 MG tablet Take 1 tablet (0.2 mg total) by mouth 3 (three) times daily. 06/05/19   Nicholes Mango, MD  docusate sodium (COLACE) 100 MG capsule Take 1 capsule (100 mg total) by mouth 2 (two) times daily as needed for mild constipation. 06/05/19   Nicholes Mango, MD  furosemide (LASIX) 80 MG tablet Take 80 mg by mouth 2 (two) times daily.     [provider]  gabapentin (NEURONTIN) 100 MG capsule TK 1 C PO TID 01/14/18   [provider]  hydrALAZINE (APRESOLINE) 100 MG tablet Take 1 tablet (100 mg total) by mouth every 6 (six) hours. 06/05/19   Gouru, Illene Silver, MD  irbesartan (AVAPRO) 300 MG tablet Take 1 tablet (300 mg total) by mouth at bedtime. 06/05/19   Gouru, Illene Silver, MD  irbesartan (AVAPRO) 300 MG tablet Take 1 tablet (300 mg total) by mouth at bedtime. 06/05/19   Nicholes Mango, MD  loratadine (CLARITIN) 10 MG tablet Take 1 tablet (10 mg total) by mouth daily. 06/06/19   Nicholes Mango, MD  multivitamin (RENA-VIT) TABS tablet Take 1 tablet by mouth daily.   05/22/18   [provider]  nicotine (NICODERM CQ - DOSED IN MG/24 HOURS) 21 mg/24hr patch Place 1 patch (21 mg total) onto the skin daily. 06/05/19   Nicholes Mango, MD  nitroGLYCERIN (NITROSTAT) 0.4 MG SL tablet Place 1 tablet (0.4 mg total) under the tongue every 5 (five) minutes as needed for chest pain. 06/05/19   Gouru, Illene Silver, MD  ondansetron (ZOFRAN) 4 MG tablet TK 1 T PO BID PRF NAUSEA 06/14/18   [provider]  sevelamer carbonate (RENVELA) 800 MG tablet Take 2,400 mg by mouth daily.     [provider]  tiotropium (SPIRIVA) 18 MCG inhalation capsule Place 1 capsule (18 mcg total) into inhaler and inhale daily. Patient taking differently: Place 18 mcg into inhaler and inhale every evening.  05/24/17   Laverle Hobby, MD  zolpidem (AMBIEN) 10 MG tablet Take 10 mg by mouth at bedtime.     [provider]    Allergies Lisinopril  Family Hx Family History  Problem Relation Age of Onset  . Heart disease Father   . Heart disease Brother   . Healthy Sister   . Stroke Neg Hx     Social Hx Social History   Tobacco Use  . Smoking status: Current Every Day Smoker    Packs/day: 0.50    Years: 32.00    Pack years: 16.00    Types: Cigarettes  . Smokeless tobacco: Never Used  . Tobacco comment: daily  Substance Use Topics  . Alcohol use: Yes    Alcohol/week: 14.0 standard drinks    Types: 14 Cans of beer per week    Comment: 2 beers/night  . Drug use: No     Review of Systems  Constitutional: Negative for fever, chills. Eyes: Negative for visual changes. ENT: Negative for sore throat. Cardiovascular: Negative for chest pain. Respiratory: Negative for shortness of breath. Gastrointestinal: Negative for nausea, vomiting.  Genitourinary: Negative for dysuria. Musculoskeletal: + bleeding Skin: Negative for rash. Neurological: Negative for for headaches.   Physical Exam  Vital Signs: ED Triage Vitals  Enc Vitals Group     BP  06/30/19 0858 (!) 209/88     Pulse Rate 06/30/19 0854 (!) 59     Resp 06/30/19 0854 20     Temp 06/30/19 0854 97.6 F (36.4 C)     Temp  Source 06/30/19 0854 Oral     SpO2 06/30/19 0854 100 %     Weight 06/30/19 0856 160 lb (72.6 kg)     Height 06/30/19 0856 5\' 7"  (1.702 m)     Head Circumference --      Peak Flow --      Pain Score 06/30/19 0856 0     Pain Loc --      Pain Edu? --      Excl. in [place]? --     Constitutional: Alert and oriented.  Head: Normocephalic. Atraumatic. Eyes: Conjunctivae clear. Sclera anicteric. Nose: No congestion. No rhinorrhea. Mouth/Throat: Mucous membranes are moist.  Neck: No stridor.   Cardiovascular: Normal rate, regular rhythm.  Respiratory: Normal respiratory effort.   Gastrointestinal:  Non-distended.  Musculoskeletal: AVF in LEFT forearm. 3 very small, pinpoint areas of minimal, active oozing - two from his access site from dialysis yesterday and 1 from a recent suture site.  Palpable thrill without evidence of overlying erythema, warmth, or infection. Neurologic:  Normal speech and language. No gross focal neurologic deficits are appreciated. Motor/sensation in tact in radial, ulnar, median distribution in LUE. Skin: LEFT forearm: no erythema, warmth, induration, or purulent drainage. Psychiatric: Mood and affect are appropriate for situation.  EKG  N/A    Radiology  N/A   Procedures  Procedure(s) performed (including critical care):  Procedures   Initial Impression / Assessment and Plan / ED Course  58 y.o. male with a history of ESRD (MWFSat HD) via LEFT forearm AV fistula who presents from dialysis today for bleeding at his fistula site.  His bleeding is likely related to his anticoagulation and ESRD status.  No evidence of superimposed infection.  Palpable thrill.  On arrival to the emergency department, his bleeding is minimal, and almost stopped completely.  He has 3 very small, pinpoint areas of minimal, active oozing -  two from his access site from dialysis yesterday and 1 from a recent suture site.  Palpable thrill without evidence of overlying erythema, warmth, or infection.  At this point, his bleeding has nearly stopped, there is no area that requires any suture repair.  Do not feel he requires any reversal of anticoagulation or DDAVP.  Will place some topical gauze soaked TXA to ensure resolution, and reassess.  Bleeding has improved after pressure and topical TXA.  He remains distally neurovascularly intact.  As such, will plan for discharge.  Advise he hold his second Eliquis dose today, and resume tomorrow.  Discussed return precautions and advised outpatient follow-up.  Final Clinical Impression(s) / ED Diagnosis  Final diagnoses:  Bleeding       Note:  This document was prepared using Dragon voice recognition software and may include unintentional dictation errors.   Lilia Pro., MD 06/30/19 [phone]

## 2019-07-02 DIAGNOSIS — T82848A Pain from vascular prosthetic devices, implants and grafts, initial encounter: Secondary | ICD-10-CM | POA: Diagnosis not present

## 2019-07-02 DIAGNOSIS — D509 Iron deficiency anemia, unspecified: Secondary | ICD-10-CM | POA: Diagnosis not present

## 2019-07-02 DIAGNOSIS — T82898A Other specified complication of vascular prosthetic devices, implants and grafts, initial encounter: Secondary | ICD-10-CM | POA: Diagnosis not present

## 2019-07-02 DIAGNOSIS — N2581 Secondary hyperparathyroidism of renal origin: Secondary | ICD-10-CM | POA: Diagnosis not present

## 2019-07-02 DIAGNOSIS — N186 End stage renal disease: Secondary | ICD-10-CM | POA: Diagnosis not present

## 2019-07-02 DIAGNOSIS — Z23 Encounter for immunization: Secondary | ICD-10-CM | POA: Diagnosis not present

## 2019-07-04 DIAGNOSIS — N2581 Secondary hyperparathyroidism of renal origin: Secondary | ICD-10-CM | POA: Diagnosis not present

## 2019-07-04 DIAGNOSIS — T82848A Pain from vascular prosthetic devices, implants and grafts, initial encounter: Secondary | ICD-10-CM | POA: Diagnosis not present

## 2019-07-04 DIAGNOSIS — D509 Iron deficiency anemia, unspecified: Secondary | ICD-10-CM | POA: Diagnosis not present

## 2019-07-04 DIAGNOSIS — T82898A Other specified complication of vascular prosthetic devices, implants and grafts, initial encounter: Secondary | ICD-10-CM | POA: Diagnosis not present

## 2019-07-04 DIAGNOSIS — Z23 Encounter for immunization: Secondary | ICD-10-CM | POA: Diagnosis not present

## 2019-07-04 DIAGNOSIS — N186 End stage renal disease: Secondary | ICD-10-CM | POA: Diagnosis not present

## 2019-07-06 DIAGNOSIS — N186 End stage renal disease: Secondary | ICD-10-CM | POA: Diagnosis not present

## 2019-07-06 DIAGNOSIS — Z23 Encounter for immunization: Secondary | ICD-10-CM | POA: Diagnosis not present

## 2019-07-06 DIAGNOSIS — T82848A Pain from vascular prosthetic devices, implants and grafts, initial encounter: Secondary | ICD-10-CM | POA: Diagnosis not present

## 2019-07-06 DIAGNOSIS — N2581 Secondary hyperparathyroidism of renal origin: Secondary | ICD-10-CM | POA: Diagnosis not present

## 2019-07-06 DIAGNOSIS — T82898A Other specified complication of vascular prosthetic devices, implants and grafts, initial encounter: Secondary | ICD-10-CM | POA: Diagnosis not present

## 2019-07-06 DIAGNOSIS — D509 Iron deficiency anemia, unspecified: Secondary | ICD-10-CM | POA: Diagnosis not present

## 2019-07-07 DIAGNOSIS — T82898A Other specified complication of vascular prosthetic devices, implants and grafts, initial encounter: Secondary | ICD-10-CM | POA: Diagnosis not present

## 2019-07-07 DIAGNOSIS — N186 End stage renal disease: Secondary | ICD-10-CM | POA: Diagnosis not present

## 2019-07-07 DIAGNOSIS — T82848A Pain from vascular prosthetic devices, implants and grafts, initial encounter: Secondary | ICD-10-CM | POA: Diagnosis not present

## 2019-07-07 DIAGNOSIS — N2581 Secondary hyperparathyroidism of renal origin: Secondary | ICD-10-CM | POA: Diagnosis not present

## 2019-07-07 DIAGNOSIS — D509 Iron deficiency anemia, unspecified: Secondary | ICD-10-CM | POA: Diagnosis not present

## 2019-07-07 DIAGNOSIS — Z23 Encounter for immunization: Secondary | ICD-10-CM | POA: Diagnosis not present

## 2019-07-09 DIAGNOSIS — T82848A Pain from vascular prosthetic devices, implants and grafts, initial encounter: Secondary | ICD-10-CM | POA: Diagnosis not present

## 2019-07-09 DIAGNOSIS — N2581 Secondary hyperparathyroidism of renal origin: Secondary | ICD-10-CM | POA: Diagnosis not present

## 2019-07-09 DIAGNOSIS — D509 Iron deficiency anemia, unspecified: Secondary | ICD-10-CM | POA: Diagnosis not present

## 2019-07-09 DIAGNOSIS — Z992 Dependence on renal dialysis: Secondary | ICD-10-CM | POA: Diagnosis not present

## 2019-07-09 DIAGNOSIS — T82898A Other specified complication of vascular prosthetic devices, implants and grafts, initial encounter: Secondary | ICD-10-CM | POA: Diagnosis not present

## 2019-07-09 DIAGNOSIS — N186 End stage renal disease: Secondary | ICD-10-CM | POA: Diagnosis not present

## 2019-07-09 DIAGNOSIS — Z23 Encounter for immunization: Secondary | ICD-10-CM | POA: Diagnosis not present

## 2019-07-11 DIAGNOSIS — T82848A Pain from vascular prosthetic devices, implants and grafts, initial encounter: Secondary | ICD-10-CM | POA: Diagnosis not present

## 2019-07-11 DIAGNOSIS — N2581 Secondary hyperparathyroidism of renal origin: Secondary | ICD-10-CM | POA: Diagnosis not present

## 2019-07-11 DIAGNOSIS — Z23 Encounter for immunization: Secondary | ICD-10-CM | POA: Diagnosis not present

## 2019-07-11 DIAGNOSIS — N186 End stage renal disease: Secondary | ICD-10-CM | POA: Diagnosis not present

## 2019-07-11 DIAGNOSIS — T82898A Other specified complication of vascular prosthetic devices, implants and grafts, initial encounter: Secondary | ICD-10-CM | POA: Diagnosis not present

## 2019-07-11 DIAGNOSIS — D509 Iron deficiency anemia, unspecified: Secondary | ICD-10-CM | POA: Diagnosis not present

## 2019-07-13 ENCOUNTER — Encounter: Payer: Self-pay | Admitting: *Deleted

## 2019-07-13 ENCOUNTER — Other Ambulatory Visit: Payer: Self-pay

## 2019-07-13 ENCOUNTER — Encounter
Disposition: A | Discharge status: Home Health | Payer: Self-pay | Source: Other Acute Inpatient Hospital | Attending: Vascular Surgery

## 2019-07-13 ENCOUNTER — Ambulatory Visit: Payer: Medicare Other | Admitting: Anesthesiology

## 2019-07-13 ENCOUNTER — Other Ambulatory Visit (INDEPENDENT_AMBULATORY_CARE_PROVIDER_SITE_OTHER): Payer: Self-pay | Admitting: Vascular Surgery

## 2019-07-13 ENCOUNTER — Inpatient Hospital Stay
Admit: 2019-07-13 | Discharge: 2019-07-17 | DRG: 252 | Disposition: A | Discharge status: Home Health | Payer: Medicare Other | Source: Other Acute Inpatient Hospital | Attending: Vascular Surgery | Admitting: Vascular Surgery

## 2019-07-13 DIAGNOSIS — T827XXA Infection and inflammatory reaction due to other cardiac and vascular devices, implants and grafts, initial encounter: Principal | ICD-10-CM | POA: Diagnosis present

## 2019-07-13 DIAGNOSIS — T829XXA Unspecified complication of cardiac and vascular prosthetic device, implant and graft, initial encounter: Secondary | ICD-10-CM | POA: Diagnosis present

## 2019-07-13 DIAGNOSIS — K219 Gastro-esophageal reflux disease without esophagitis: Secondary | ICD-10-CM | POA: Diagnosis present

## 2019-07-13 DIAGNOSIS — F17210 Nicotine dependence, cigarettes, uncomplicated: Secondary | ICD-10-CM | POA: Diagnosis present

## 2019-07-13 DIAGNOSIS — Z8249 Family history of ischemic heart disease and other diseases of the circulatory system: Secondary | ICD-10-CM

## 2019-07-13 DIAGNOSIS — Z20828 Contact with and (suspected) exposure to other viral communicable diseases: Secondary | ICD-10-CM | POA: Diagnosis present

## 2019-07-13 DIAGNOSIS — F419 Anxiety disorder, unspecified: Secondary | ICD-10-CM | POA: Diagnosis present

## 2019-07-13 DIAGNOSIS — Z951 Presence of aortocoronary bypass graft: Secondary | ICD-10-CM

## 2019-07-13 DIAGNOSIS — I5032 Chronic diastolic (congestive) heart failure: Secondary | ICD-10-CM | POA: Diagnosis present

## 2019-07-13 DIAGNOSIS — N19 Unspecified kidney failure: Secondary | ICD-10-CM | POA: Diagnosis not present

## 2019-07-13 DIAGNOSIS — I480 Paroxysmal atrial fibrillation: Secondary | ICD-10-CM | POA: Diagnosis present

## 2019-07-13 DIAGNOSIS — Z992 Dependence on renal dialysis: Secondary | ICD-10-CM | POA: Diagnosis not present

## 2019-07-13 DIAGNOSIS — N186 End stage renal disease: Secondary | ICD-10-CM | POA: Diagnosis not present

## 2019-07-13 DIAGNOSIS — T82848A Pain from vascular prosthetic devices, implants and grafts, initial encounter: Secondary | ICD-10-CM | POA: Diagnosis not present

## 2019-07-13 DIAGNOSIS — Y832 Surgical operation with anastomosis, bypass or graft as the cause of abnormal reaction of the patient, or of later complication, without mention of misadventure at the time of the procedure: Secondary | ICD-10-CM | POA: Diagnosis present

## 2019-07-13 DIAGNOSIS — R791 Abnormal coagulation profile: Secondary | ICD-10-CM | POA: Diagnosis present

## 2019-07-13 DIAGNOSIS — Z23 Encounter for immunization: Secondary | ICD-10-CM | POA: Diagnosis not present

## 2019-07-13 DIAGNOSIS — M069 Rheumatoid arthritis, unspecified: Secondary | ICD-10-CM | POA: Diagnosis present

## 2019-07-13 DIAGNOSIS — N2581 Secondary hyperparathyroidism of renal origin: Secondary | ICD-10-CM | POA: Diagnosis present

## 2019-07-13 DIAGNOSIS — I2510 Atherosclerotic heart disease of native coronary artery without angina pectoris: Secondary | ICD-10-CM | POA: Diagnosis present

## 2019-07-13 DIAGNOSIS — I252 Old myocardial infarction: Secondary | ICD-10-CM | POA: Diagnosis not present

## 2019-07-13 DIAGNOSIS — Z79899 Other long term (current) drug therapy: Secondary | ICD-10-CM

## 2019-07-13 DIAGNOSIS — G4730 Sleep apnea, unspecified: Secondary | ICD-10-CM | POA: Diagnosis present

## 2019-07-13 DIAGNOSIS — Z7901 Long term (current) use of anticoagulants: Secondary | ICD-10-CM

## 2019-07-13 DIAGNOSIS — D631 Anemia in chronic kidney disease: Secondary | ICD-10-CM | POA: Diagnosis present

## 2019-07-13 DIAGNOSIS — R1110 Vomiting, unspecified: Secondary | ICD-10-CM | POA: Diagnosis not present

## 2019-07-13 DIAGNOSIS — Z01810 Encounter for preprocedural cardiovascular examination: Secondary | ICD-10-CM | POA: Diagnosis not present

## 2019-07-13 DIAGNOSIS — I132 Hypertensive heart and chronic kidney disease with heart failure and with stage 5 chronic kidney disease, or end stage renal disease: Secondary | ICD-10-CM | POA: Diagnosis present

## 2019-07-13 DIAGNOSIS — E785 Hyperlipidemia, unspecified: Secondary | ICD-10-CM | POA: Diagnosis present

## 2019-07-13 DIAGNOSIS — R110 Nausea: Secondary | ICD-10-CM | POA: Diagnosis not present

## 2019-07-13 DIAGNOSIS — D509 Iron deficiency anemia, unspecified: Secondary | ICD-10-CM | POA: Diagnosis not present

## 2019-07-13 DIAGNOSIS — J449 Chronic obstructive pulmonary disease, unspecified: Secondary | ICD-10-CM | POA: Diagnosis present

## 2019-07-13 DIAGNOSIS — T82898A Other specified complication of vascular prosthetic devices, implants and grafts, initial encounter: Secondary | ICD-10-CM | POA: Diagnosis not present

## 2019-07-13 HISTORY — PX: LIGATION OF ARTERIOVENOUS  FISTULA: SHX5948

## 2019-07-13 LAB — CBC WITH DIFFERENTIAL/PLATELET
Abs Immature Granulocytes: 0.02 10*3/uL (ref 0.00–0.07)
Basophils Absolute: 0.1 10*3/uL (ref 0.0–0.1)
Basophils Relative: 1 %
Eosinophils Absolute: 0.8 10*3/uL — ABNORMAL HIGH (ref 0.0–0.5)
Eosinophils Relative: 14 %
HCT: 27.1 % — ABNORMAL LOW (ref 39.0–52.0)
Hemoglobin: 9.1 g/dL — ABNORMAL LOW (ref 13.0–17.0)
Immature Granulocytes: 0 %
Lymphocytes Relative: 17 %
Lymphs Abs: 1 10*3/uL (ref 0.7–4.0)
MCH: 35.7 pg — ABNORMAL HIGH (ref 26.0–34.0)
MCHC: 33.6 g/dL (ref 30.0–36.0)
MCV: 106.3 fL — ABNORMAL HIGH (ref 80.0–100.0)
Monocytes Absolute: 0.9 10*3/uL (ref 0.1–1.0)
Monocytes Relative: 17 %
Neutro Abs: 2.8 10*3/uL (ref 1.7–7.7)
Neutrophils Relative %: 51 %
Platelets: 250 10*3/uL (ref 150–400)
RBC: 2.55 MIL/uL — ABNORMAL LOW (ref 4.22–5.81)
RDW: 15.7 % — ABNORMAL HIGH (ref 11.5–15.5)
WBC: 5.6 10*3/uL (ref 4.0–10.5)
nRBC: 0 % (ref 0.0–0.2)

## 2019-07-13 LAB — BASIC METABOLIC PANEL
Anion gap: 10 (ref 5–15)
BUN: 11 mg/dL (ref 6–20)
CO2: 28 mmol/L (ref 22–32)
Calcium: 8.2 mg/dL — ABNORMAL LOW (ref 8.9–10.3)
Chloride: 97 mmol/L — ABNORMAL LOW (ref 98–111)
Creatinine, Ser: 3.72 mg/dL — ABNORMAL HIGH (ref 0.61–1.24)
GFR calc Af Amer: 20 mL/min — ABNORMAL LOW (ref >60)
GFR calc non Af Amer: 17 mL/min — ABNORMAL LOW (ref >60)
Glucose, Bld: 101 mg/dL — ABNORMAL HIGH (ref 70–99)
Potassium: 3.7 mmol/L (ref 3.5–5.1)
Sodium: 135 mmol/L (ref 135–145)

## 2019-07-13 LAB — SARS CORONAVIRUS 2 BY RT PCR (HOSPITAL ORDER, PERFORMED IN ~~LOC~~ HOSPITAL LAB): SARS Coronavirus 2: NEGATIVE

## 2019-07-13 LAB — PROTIME-INR
INR: 1.5 — ABNORMAL HIGH (ref 0.8–1.2)
Prothrombin Time: 17.5 seconds — ABNORMAL HIGH (ref 11.4–15.2)

## 2019-07-13 LAB — APTT: aPTT: 38 seconds — ABNORMAL HIGH (ref 24–36)

## 2019-07-13 SURGERY — LIGATION OF ARTERIOVENOUS  FISTULA
Anesthesia: General | Laterality: Left

## 2019-07-13 MED ORDER — FOLIC ACID 1 MG PO TABS
1.0000 mg | ORAL_TABLET | Freq: Every day | ORAL | Status: DC
  Administered 2019-07-13 – 2019-07-17 (×5): 1 mg via ORAL
  Filled 2019-07-13 (×5): qty 1

## 2019-07-13 MED ORDER — AMLODIPINE BESYLATE 10 MG PO TABS
10.0000 mg | ORAL_TABLET | Freq: Every day | ORAL | Status: DC
  Administered 2019-07-14 – 2019-07-17 (×4): 10 mg via ORAL
  Filled 2019-07-13 (×4): qty 1

## 2019-07-13 MED ORDER — CHLORHEXIDINE GLUCONATE CLOTH 2 % EX PADS: 6.0000 | MEDICATED_PAD | Freq: Once | CUTANEOUS | Status: DC

## 2019-07-13 MED ORDER — ONDANSETRON HCL 4 MG/2ML IJ SOLN
INTRAMUSCULAR | Status: AC
  Filled 2019-07-13: qty 2

## 2019-07-13 MED ORDER — ADULT MULTIVITAMIN W/MINERALS CH
1.0000 | ORAL_TABLET | Freq: Every day | ORAL | Status: DC
  Administered 2019-07-13 – 2019-07-17 (×5): 1 via ORAL
  Filled 2019-07-13 (×5): qty 1

## 2019-07-13 MED ORDER — HYDRALAZINE HCL 50 MG PO TABS
100.0000 mg | ORAL_TABLET | Freq: Four times a day (QID) | ORAL | Status: DC
  Administered 2019-07-13 – 2019-07-17 (×13): 100 mg via ORAL
  Filled 2019-07-13 (×13): qty 2

## 2019-07-13 MED ORDER — IRBESARTAN 150 MG PO TABS: 300.0000 mg | ORAL_TABLET | Freq: Every day | ORAL | Status: DC

## 2019-07-13 MED ORDER — BUDESONIDE 0.25 MG/2ML IN SUSP
0.2500 mg | Freq: Two times a day (BID) | RESPIRATORY_TRACT | Status: DC
  Administered 2019-07-13 – 2019-07-17 (×6): 0.25 mg via RESPIRATORY_TRACT
  Filled 2019-07-13 (×7): qty 2

## 2019-07-13 MED ORDER — FERRIC CITRATE 1 GM 210 MG(FE) PO TABS
210.0000 mg | ORAL_TABLET | Freq: Three times a day (TID) | ORAL | Status: DC
  Administered 2019-07-14 – 2019-07-17 (×7): 210 mg via ORAL
  Filled 2019-07-13 (×14): qty 1

## 2019-07-13 MED ORDER — ACETAMINOPHEN 325 MG PO TABS: 325.0000 mg | ORAL_TABLET | ORAL | Status: DC | PRN

## 2019-07-13 MED ORDER — ACETAMINOPHEN 650 MG RE SUPP: 650.0000 mg | Freq: Four times a day (QID) | RECTAL | Status: DC | PRN

## 2019-07-13 MED ORDER — GLYCOPYRROLATE 0.2 MG/ML IJ SOLN
INTRAMUSCULAR | Status: AC
  Filled 2019-07-13: qty 1

## 2019-07-13 MED ORDER — FENTANYL CITRATE (PF) 100 MCG/2ML IJ SOLN
INTRAMUSCULAR | Status: AC
  Filled 2019-07-13: qty 2

## 2019-07-13 MED ORDER — MORPHINE SULFATE (PF) 2 MG/ML IV SOLN
2.0000 mg | INTRAVENOUS | Status: DC | PRN
  Administered 2019-07-13: 2 mg via INTRAVENOUS
  Filled 2019-07-13: qty 1

## 2019-07-13 MED ORDER — MORPHINE SULFATE (PF) 2 MG/ML IV SOLN: 2.0000 mg | INTRAVENOUS | Status: DC | PRN

## 2019-07-13 MED ORDER — EPHEDRINE SULFATE 50 MG/ML IJ SOLN
INTRAMUSCULAR | Status: DC | PRN
  Administered 2019-07-13 (×2): 10 mg via INTRAVENOUS

## 2019-07-13 MED ORDER — SEVELAMER CARBONATE 800 MG PO TABS
2400.0000 mg | ORAL_TABLET | Freq: Every day | ORAL | Status: DC
  Administered 2019-07-13 – 2019-07-17 (×5): 2400 mg via ORAL
  Filled 2019-07-13 (×5): qty 3

## 2019-07-13 MED ORDER — NICOTINE 21 MG/24HR TD PT24
21.0000 mg | MEDICATED_PATCH | Freq: Every day | TRANSDERMAL | Status: DC
  Administered 2019-07-13 – 2019-07-17 (×4): 21 mg via TRANSDERMAL
  Filled 2019-07-13 (×5): qty 1

## 2019-07-13 MED ORDER — SODIUM CHLORIDE 0.9 % IV SOLN
Freq: Once | INTRAVENOUS | Status: AC
  Administered 2019-07-13: 12:00:00 via INTRAVENOUS

## 2019-07-13 MED ORDER — DEXAMETHASONE SODIUM PHOSPHATE 10 MG/ML IJ SOLN
INTRAMUSCULAR | Status: DC | PRN
  Administered 2019-07-13: 5 mg via INTRAVENOUS

## 2019-07-13 MED ORDER — TIOTROPIUM BROMIDE MONOHYDRATE 18 MCG IN CAPS
18.0000 ug | ORAL_CAPSULE | Freq: Every day | RESPIRATORY_TRACT | Status: DC
  Administered 2019-07-14 – 2019-07-17 (×4): 18 ug via RESPIRATORY_TRACT
  Filled 2019-07-13: qty 5

## 2019-07-13 MED ORDER — MIDAZOLAM HCL 2 MG/2ML IJ SOLN
INTRAMUSCULAR | Status: AC
  Filled 2019-07-13: qty 2

## 2019-07-13 MED ORDER — DOCUSATE SODIUM 100 MG PO CAPS: 100.0000 mg | ORAL_CAPSULE | Freq: Two times a day (BID) | ORAL | Status: DC

## 2019-07-13 MED ORDER — LIDOCAINE HCL (CARDIAC) PF 100 MG/5ML IV SOSY
PREFILLED_SYRINGE | INTRAVENOUS | Status: DC | PRN
  Administered 2019-07-13: 100 mg via INTRAVENOUS

## 2019-07-13 MED ORDER — FUROSEMIDE 40 MG PO TABS
80.0000 mg | ORAL_TABLET | Freq: Two times a day (BID) | ORAL | Status: DC
  Administered 2019-07-13 – 2019-07-17 (×7): 80 mg via ORAL
  Filled 2019-07-13: qty 1
  Filled 2019-07-13 (×2): qty 2
  Filled 2019-07-13 (×2): qty 1
  Filled 2019-07-13: qty 2
  Filled 2019-07-13: qty 1
  Filled 2019-07-13 (×2): qty 2
  Filled 2019-07-13 (×3): qty 1
  Filled 2019-07-13 (×2): qty 2

## 2019-07-13 MED ORDER — ACETAMINOPHEN 160 MG/5ML PO SOLN
325.0000 mg | ORAL | Status: DC | PRN
  Filled 2019-07-13: qty 20.3

## 2019-07-13 MED ORDER — CALCIUM CARBONATE ANTACID 500 MG PO CHEW
1.0000 | CHEWABLE_TABLET | Freq: Three times a day (TID) | ORAL | Status: DC
  Administered 2019-07-13 – 2019-07-17 (×10): 200 mg via ORAL
  Filled 2019-07-13 (×10): qty 1

## 2019-07-13 MED ORDER — BUPIVACAINE HCL (PF) 0.5 % IJ SOLN
INTRAMUSCULAR | Status: AC
  Filled 2019-07-13: qty 30

## 2019-07-13 MED ORDER — ONDANSETRON HCL 4 MG/2ML IJ SOLN
INTRAMUSCULAR | Status: DC | PRN
  Administered 2019-07-13: 4 mg via INTRAVENOUS

## 2019-07-13 MED ORDER — CARVEDILOL 25 MG PO TABS
25.0000 mg | ORAL_TABLET | Freq: Two times a day (BID) | ORAL | Status: DC
  Administered 2019-07-13 – 2019-07-17 (×6): 25 mg via ORAL
  Filled 2019-07-13 (×6): qty 1

## 2019-07-13 MED ORDER — EPINEPHRINE PF 1 MG/ML IJ SOLN
INTRAMUSCULAR | Status: AC
  Filled 2019-07-13: qty 1

## 2019-07-13 MED ORDER — CINACALCET HCL 30 MG PO TABS
30.0000 mg | ORAL_TABLET | Freq: Every day | ORAL | Status: DC
  Administered 2019-07-14 – 2019-07-15 (×2): 30 mg via ORAL
  Filled 2019-07-13 (×5): qty 1

## 2019-07-13 MED ORDER — HEPARIN SODIUM (PORCINE) 5000 UNIT/ML IJ SOLN
INTRAMUSCULAR | Status: DC | PRN
  Administered 2019-07-13: 5000 [IU] via SUBCUTANEOUS

## 2019-07-13 MED ORDER — LORAZEPAM 2 MG/ML IJ SOLN: 1.0000 mg | INTRAMUSCULAR | Status: AC | PRN

## 2019-07-13 MED ORDER — APIXABAN 5 MG PO TABS
5.0000 mg | ORAL_TABLET | Freq: Two times a day (BID) | ORAL | Status: DC
  Administered 2019-07-14: 11:00:00 5 mg via ORAL
  Filled 2019-07-13: qty 1

## 2019-07-13 MED ORDER — CEFAZOLIN SODIUM-DEXTROSE 1-4 GM/50ML-% IV SOLN
INTRAVENOUS | Status: AC
  Filled 2019-07-13: qty 50

## 2019-07-13 MED ORDER — ONDANSETRON HCL 4 MG PO TABS
4.0000 mg | ORAL_TABLET | Freq: Four times a day (QID) | ORAL | Status: DC | PRN
  Administered 2019-07-14: 4 mg via ORAL
  Filled 2019-07-13: qty 1

## 2019-07-13 MED ORDER — LACTATED RINGERS IV SOLN
INTRAVENOUS | Status: DC | PRN
  Administered 2019-07-13: 13:00:00 via INTRAVENOUS

## 2019-07-13 MED ORDER — FENTANYL CITRATE (PF) 100 MCG/2ML IJ SOLN: 25.0000 ug | INTRAMUSCULAR | Status: DC | PRN

## 2019-07-13 MED ORDER — HEPARIN SODIUM (PORCINE) 5000 UNIT/ML IJ SOLN
INTRAMUSCULAR | Status: AC
  Filled 2019-07-13: qty 1

## 2019-07-13 MED ORDER — NITROGLYCERIN 0.4 MG SL SUBL: 0.4000 mg | SUBLINGUAL_TABLET | SUBLINGUAL | Status: DC | PRN

## 2019-07-13 MED ORDER — RENA-VITE PO TABS
1.0000 | ORAL_TABLET | Freq: Every day | ORAL | Status: DC
  Administered 2019-07-14 – 2019-07-17 (×4): 1 via ORAL
  Filled 2019-07-13 (×4): qty 1

## 2019-07-13 MED ORDER — ATORVASTATIN CALCIUM 20 MG PO TABS
20.0000 mg | ORAL_TABLET | Freq: Every day | ORAL | Status: DC
  Administered 2019-07-13 – 2019-07-16 (×4): 20 mg via ORAL
  Filled 2019-07-13 (×4): qty 1

## 2019-07-13 MED ORDER — LIDOCAINE HCL (PF) 2 % IJ SOLN
INTRAMUSCULAR | Status: AC
  Filled 2019-07-13: qty 10

## 2019-07-13 MED ORDER — DOCUSATE SODIUM 100 MG PO CAPS: 100.0000 mg | ORAL_CAPSULE | Freq: Two times a day (BID) | ORAL | Status: DC | PRN

## 2019-07-13 MED ORDER — SORBITOL 70 % SOLN
30.0000 mL | Freq: Every day | Status: DC | PRN
  Filled 2019-07-13: qty 30

## 2019-07-13 MED ORDER — AMIODARONE HCL 200 MG PO TABS
200.0000 mg | ORAL_TABLET | Freq: Two times a day (BID) | ORAL | Status: DC
  Administered 2019-07-13 – 2019-07-17 (×8): 200 mg via ORAL
  Filled 2019-07-13 (×8): qty 1

## 2019-07-13 MED ORDER — CLONIDINE HCL 0.1 MG PO TABS
0.2000 mg | ORAL_TABLET | Freq: Three times a day (TID) | ORAL | Status: DC
  Administered 2019-07-13 – 2019-07-17 (×9): 0.2 mg via ORAL
  Filled 2019-07-13 (×10): qty 2

## 2019-07-13 MED ORDER — MAGNESIUM HYDROXIDE 400 MG/5ML PO SUSP
30.0000 mL | Freq: Every day | ORAL | Status: DC | PRN
  Administered 2019-07-13 – 2019-07-15 (×2): 30 mL via ORAL
  Filled 2019-07-13 (×2): qty 30

## 2019-07-13 MED ORDER — ZOLPIDEM TARTRATE 5 MG PO TABS
10.0000 mg | ORAL_TABLET | Freq: Every day | ORAL | Status: DC
  Administered 2019-07-13 – 2019-07-16 (×4): 10 mg via ORAL
  Filled 2019-07-13 (×4): qty 2

## 2019-07-13 MED ORDER — DEXAMETHASONE SODIUM PHOSPHATE 10 MG/ML IJ SOLN
INTRAMUSCULAR | Status: AC
  Filled 2019-07-13: qty 1

## 2019-07-13 MED ORDER — SODIUM CHLORIDE 0.9 % IV SOLN
INTRAVENOUS | Status: DC | PRN
  Administered 2019-07-13: 14:00:00 via INTRAVENOUS

## 2019-07-13 MED ORDER — ALBUTEROL SULFATE (2.5 MG/3ML) 0.083% IN NEBU: 2.5000 mg | INHALATION_SOLUTION | RESPIRATORY_TRACT | Status: DC | PRN

## 2019-07-13 MED ORDER — PROPOFOL 10 MG/ML IV BOLUS
INTRAVENOUS | Status: DC | PRN
  Administered 2019-07-13: 150 mg via INTRAVENOUS

## 2019-07-13 MED ORDER — THIAMINE HCL 100 MG/ML IJ SOLN
100.0000 mg | Freq: Every day | INTRAMUSCULAR | Status: DC
  Filled 2019-07-13: qty 2

## 2019-07-13 MED ORDER — CEFAZOLIN SODIUM-DEXTROSE 1-4 GM/50ML-% IV SOLN
1.0000 g | INTRAVENOUS | Status: AC
  Administered 2019-07-13: 2 g via INTRAVENOUS

## 2019-07-13 MED ORDER — EVICEL 5 ML EX KIT
PACK | CUTANEOUS | Status: DC | PRN
  Administered 2019-07-13: 5 mL

## 2019-07-13 MED ORDER — MIDAZOLAM HCL 2 MG/2ML IJ SOLN
INTRAMUSCULAR | Status: DC | PRN
  Administered 2019-07-13: 2 mg via INTRAVENOUS

## 2019-07-13 MED ORDER — PIPERACILLIN-TAZOBACTAM 3.375 G IVPB
3.3750 g | Freq: Two times a day (BID) | INTRAVENOUS | Status: DC
  Administered 2019-07-13 – 2019-07-17 (×8): 3.375 g via INTRAVENOUS
  Filled 2019-07-13 (×8): qty 50

## 2019-07-13 MED ORDER — SODIUM CHLORIDE 0.9 % IV SOLN
INTRAVENOUS | Status: DC
  Administered 2019-07-13: 16:00:00 via INTRAVENOUS

## 2019-07-13 MED ORDER — FLEET ENEMA 7-19 GM/118ML RE ENEM: 1.0000 | ENEMA | Freq: Once | RECTAL | Status: DC | PRN

## 2019-07-13 MED ORDER — ONDANSETRON HCL 4 MG PO TABS: 4.0000 mg | ORAL_TABLET | Freq: Three times a day (TID) | ORAL | Status: DC | PRN

## 2019-07-13 MED ORDER — ACETAMINOPHEN 325 MG PO TABS: 650.0000 mg | ORAL_TABLET | Freq: Four times a day (QID) | ORAL | Status: DC | PRN

## 2019-07-13 MED ORDER — GLYCOPYRROLATE 0.2 MG/ML IJ SOLN
INTRAMUSCULAR | Status: DC | PRN
  Administered 2019-07-13: .2 mg via INTRAVENOUS

## 2019-07-13 MED ORDER — ONDANSETRON HCL 4 MG/2ML IJ SOLN: 4.0000 mg | Freq: Four times a day (QID) | INTRAMUSCULAR | Status: DC | PRN

## 2019-07-13 MED ORDER — ASPIRIN EC 81 MG PO TBEC
81.0000 mg | DELAYED_RELEASE_TABLET | Freq: Every day | ORAL | Status: DC
  Administered 2019-07-13 – 2019-07-17 (×4): 81 mg via ORAL
  Filled 2019-07-13 (×4): qty 1

## 2019-07-13 MED ORDER — VITAMIN B-1 100 MG PO TABS
100.0000 mg | ORAL_TABLET | Freq: Every day | ORAL | Status: DC
  Administered 2019-07-13 – 2019-07-17 (×5): 100 mg via ORAL
  Filled 2019-07-13 (×5): qty 1

## 2019-07-13 MED ORDER — GABAPENTIN 100 MG PO CAPS
100.0000 mg | ORAL_CAPSULE | Freq: Three times a day (TID) | ORAL | Status: DC
  Administered 2019-07-13 – 2019-07-17 (×11): 100 mg via ORAL
  Filled 2019-07-13 (×11): qty 1

## 2019-07-13 MED ORDER — EVICEL 5 ML EX KIT
PACK | CUTANEOUS | Status: AC
  Filled 2019-07-13: qty 1

## 2019-07-13 MED ORDER — IRBESARTAN 150 MG PO TABS
300.0000 mg | ORAL_TABLET | Freq: Every day | ORAL | Status: DC
  Administered 2019-07-13 – 2019-07-16 (×4): 300 mg via ORAL
  Filled 2019-07-13 (×4): qty 2

## 2019-07-13 MED ORDER — PROMETHAZINE HCL 25 MG/ML IJ SOLN: 6.2500 mg | INTRAMUSCULAR | Status: DC | PRN

## 2019-07-13 MED ORDER — FENTANYL CITRATE (PF) 100 MCG/2ML IJ SOLN
INTRAMUSCULAR | Status: DC | PRN
  Administered 2019-07-13: 25 ug via INTRAVENOUS
  Administered 2019-07-13: 50 ug via INTRAVENOUS

## 2019-07-13 MED ORDER — HYDROCODONE-ACETAMINOPHEN 5-325 MG PO TABS
1.0000 | ORAL_TABLET | ORAL | Status: DC | PRN
  Administered 2019-07-14: 2 via ORAL
  Administered 2019-07-17: 1 via ORAL
  Filled 2019-07-13: qty 2
  Filled 2019-07-13 (×2): qty 1

## 2019-07-13 MED ORDER — BUDESONIDE 0.5 MG/2ML IN SUSP: 0.5000 mg | Freq: Two times a day (BID) | RESPIRATORY_TRACT | Status: DC

## 2019-07-13 MED ORDER — LORAZEPAM 1 MG PO TABS: 1.0000 mg | ORAL_TABLET | ORAL | Status: AC | PRN

## 2019-07-13 MED ORDER — LORATADINE 10 MG PO TABS
10.0000 mg | ORAL_TABLET | Freq: Every day | ORAL | Status: DC
  Administered 2019-07-14 – 2019-07-17 (×4): 10 mg via ORAL
  Filled 2019-07-13 (×4): qty 1

## 2019-07-13 MED ORDER — DEXTROSE-NACL 5-0.9 % IV SOLN: INTRAVENOUS | Status: DC

## 2019-07-13 SURGICAL SUPPLY — 63 items
BAG DECANTER FOR FLEXI CONT (MISCELLANEOUS) ×3 IMPLANT
BLADE SURG SZ11 CARB STEEL (BLADE) ×3 IMPLANT
BNDG ELASTIC 4X5.8 VLCR STR LF (GAUZE/BANDAGES/DRESSINGS) ×2 IMPLANT
BOOT SUTURE AID YELLOW STND (SUTURE) ×3 IMPLANT
CANISTER SUCT 1200ML W/VALVE (MISCELLANEOUS) ×3 IMPLANT
CANISTER WOUND CARE 500ML ATS (WOUND CARE) ×2 IMPLANT
CHLORAPREP W/TINT 26 (MISCELLANEOUS) ×3 IMPLANT
CLIP SPRNG 6 S-JAW DBL (CLIP) ×1 IMPLANT
CLIP SPRNG 6MM S-JAW DBL (CLIP) ×3
COVER WAND RF STERILE (DRAPES) ×3 IMPLANT
DERMABOND ADVANCED (GAUZE/BANDAGES/DRESSINGS) ×2
DERMABOND ADVANCED .7 DNX12 (GAUZE/BANDAGES/DRESSINGS) ×1 IMPLANT
DRAPE INCISE IOBAN 66X45 STRL (DRAPES) ×2 IMPLANT
DRSG TEGADERM 2X2.25 PEDS (GAUZE/BANDAGES/DRESSINGS) ×4 IMPLANT
DRSG VAC ATS MED SENSATRAC (GAUZE/BANDAGES/DRESSINGS) ×2 IMPLANT
ELECT CAUTERY BLADE 6.4 (BLADE) ×3 IMPLANT
ELECT REM PT RETURN 9FT ADLT (ELECTROSURGICAL) ×3
ELECTRODE REM PT RTRN 9FT ADLT (ELECTROSURGICAL) ×1 IMPLANT
GAUZE XEROFORM 1X8 LF (GAUZE/BANDAGES/DRESSINGS) ×2 IMPLANT
GLOVE BIO SURGEON STRL SZ7 (GLOVE) ×6 IMPLANT
GLOVE INDICATOR 7.5 STRL GRN (GLOVE) ×3 IMPLANT
GOWN STRL REUS W/ TWL LRG LVL3 (GOWN DISPOSABLE) ×1 IMPLANT
GOWN STRL REUS W/ TWL XL LVL3 (GOWN DISPOSABLE) ×2 IMPLANT
GOWN STRL REUS W/TWL LRG LVL3 (GOWN DISPOSABLE) ×2
GOWN STRL REUS W/TWL XL LVL3 (GOWN DISPOSABLE) ×4
HEMOSTAT SURGICEL 2X3 (HEMOSTASIS) ×3 IMPLANT
IV NS 500ML (IV SOLUTION) ×2
IV NS 500ML BAXH (IV SOLUTION) ×1 IMPLANT
LABEL OR SOLS (LABEL) ×3 IMPLANT
LOOP RED MAXI  1X406MM (MISCELLANEOUS) ×2
LOOP VESSEL MAXI 1X406 RED (MISCELLANEOUS) ×1 IMPLANT
LOOP VESSEL MINI 0.8X406 BLUE (MISCELLANEOUS) ×1 IMPLANT
LOOPS BLUE MINI 0.8X406MM (MISCELLANEOUS) ×2
NDL FILTER BLUNT 18X1 1/2 (NEEDLE) ×1 IMPLANT
NDL SAFETY ECLIPSE 18X1.5 (NEEDLE) IMPLANT
NEEDLE FILTER BLUNT 18X 1/2SAF (NEEDLE) ×2
NEEDLE FILTER BLUNT 18X1 1/2 (NEEDLE) ×1 IMPLANT
NEEDLE HYPO 18GX1.5 SHARP (NEEDLE) ×2
NS IRRIG 500ML POUR BTL (IV SOLUTION) ×3 IMPLANT
PACK EXTREMITY ARMC (MISCELLANEOUS) ×3 IMPLANT
PAD PREP 24X41 OB/GYN DISP (PERSONAL CARE ITEMS) ×3 IMPLANT
SOLUTION CELL SAVER (CLIP) ×1 IMPLANT
SPONGE GAUZE 2X2 8PLY STER LF (GAUZE/BANDAGES/DRESSINGS) ×2
SPONGE GAUZE 2X2 8PLY STRL LF (GAUZE/BANDAGES/DRESSINGS) ×2 IMPLANT
SPONGE LAP 18X18 RF (DISPOSABLE) ×2 IMPLANT
STOCKINETTE 48X4 2 PLY STRL (GAUZE/BANDAGES/DRESSINGS) ×1 IMPLANT
STOCKINETTE STRL 4IN 9604848 (GAUZE/BANDAGES/DRESSINGS) ×3 IMPLANT
STRAP SAFETY 5IN WIDE (MISCELLANEOUS) ×3 IMPLANT
SUT ETHILON 3-0 FS-10 30 BLK (SUTURE) ×3
SUT MNCRL AB 4-0 PS2 18 (SUTURE) ×3 IMPLANT
SUT PROLENE 6 0 BV (SUTURE) ×3 IMPLANT
SUT SILK 2 0 (SUTURE) ×2
SUT SILK 2 0 SH (SUTURE) ×3 IMPLANT
SUT SILK 2-0 18XBRD TIE 12 (SUTURE) ×1 IMPLANT
SUT SILK 3 0 (SUTURE) ×2
SUT SILK 3-0 18XBRD TIE 12 (SUTURE) ×1 IMPLANT
SUT SILK 4 0 (SUTURE) ×2
SUT SILK 4-0 18XBRD TIE 12 (SUTURE) ×1 IMPLANT
SUT VIC AB 3-0 SH 27 (SUTURE) ×2
SUT VIC AB 3-0 SH 27X BRD (SUTURE) ×1 IMPLANT
SUTURE EHLN 3-0 FS-10 30 BLK (SUTURE) IMPLANT
SYR 20ML LL LF (SYRINGE) ×3 IMPLANT
SYR 3ML LL SCALE MARK (SYRINGE) ×3 IMPLANT

## 2019-07-13 NOTE — Transfer of Care (Signed)
Immediate Anesthesia Transfer of Care Note  Patient: Hyman Bower  Procedure(s) Performed: LIGATION OF ARTERIOVENOUS  FISTULA (Left )  Patient Location: PACU  Anesthesia Type:General  Level of Consciousness: awake and alert   Airway & Oxygen Therapy: Patient Spontanous Breathing and Patient connected to face mask oxygen  Post-op Assessment: Report given to RN and Post -op Vital signs reviewed and stable  Post vital signs: Reviewed and stable  Last Vitals:  Vitals Value Taken Time  BP    Temp    Pulse 61 07/13/19 1520  Resp 17 07/13/19 1520  SpO2 99 % 07/13/19 1520  Vitals shown include unvalidated device data.  Last Pain:  Vitals:   07/13/19 1159  TempSrc: Temporal  PainSc:          Complications: No apparent anesthesia complications

## 2019-07-13 NOTE — Op Note (Signed)
 VEIN AND VASCULAR SURGERY   OPERATIVE NOTE  DATE: 07/13/2019  PRE-OPERATIVE DIAGNOSIS: ESRD, infected left arm Artegraft revision of his forearm AV fistula  POST-OPERATIVE DIAGNOSIS: same as above  PROCEDURE: 1.   Ligation of native portion of AV fistula with excision of the entire Artegraft and the surrounding infected hematoma with negative pressure dressing placement  SURGEON: Leotis Pain  ASSISTANT(S): Hezzie Bump, PA-C  ANESTHESIA: general  ESTIMATED BLOOD LOSS: 50 cc  FINDING(S): 1.  Poorly incorporated Artegraft with surrounding hematoma with infection although no gross purulence was seen   INDICATIONS:   Daryl Weeks is a 58 y.o. male who presents with an aneurysmal, inflamed left forearm Artegraft revision of his AV fistula with recurrent bleeding.  This clearly needed to be ligated and excised.  He had dialysis this morning and so after the graft excision he will get a 48 to 72-hour evaluation before any new line access is placed.  A future endeavor for a permanent access will be considered but not in the immediate future.  Risks and benefits were discussed and informed consent was obtained. An assistant was present during the procedure to help facilitate the exposure and expedite the procedure.  DESCRIPTION: After obtaining full informed written consent, the patient was brought back to the operating room and placed supine upon the operating table.  The patient was prepped and draped in the standard fashion. The assistant provided retraction and mobilization to help facilitate exposure and expedite the procedure throughout the entire procedure.  This included following suture, using retractors, and optimizing lighting.  I started by making incision in the distal forearm and near the antecubital fossa to dissect down to the native vein proximal and distal to the Artegraft revision.  This portion of the fistula was dissected out and ligated and divided between silk  ties with a silk suture ligature placed on the staying sides of the veins.  I then made an incision overlying the aneurysmal access sites that were markedly inflamed.  We dissected down and around the area of wounds.  We immediately encountered significant hematoma and unhealthy tissue although gross purulence was not seen.  The Artegraft was identified and was fairly poorly incorporated.  It was then dissected out and removed its entirety down to the area of the ligated vein distally and proximally.  Patient is on Eliquis and his tissue was significantly oozy and several minutes of pressure were held.  The transverse incisions overlying the proximal and distal ligation sites were then closed with 3-0 Vicryl and 3-0 nylon sutures.  The large incision overlying the old access that was excised could not be closed due to the infected nature of the wound.  It was irrigated.  Surgicel and Adacel were placed and then a VAC sponge was cut to fit the wound.  It was then placed in the wound and a good occlusive seal was obtained with Ioban.  It was connected to suction and placed at 125 cm of suction. At this point, we elected to complete the procedure.  The patient was taken to the recovery room in stable condition.   COMPLICATIONS: None  CONDITION: Stable  Leotis Pain  07/13/2019, 2:56 PM    This note was created with Dragon Medical transcription system. Any errors in dictation are purely unintentional.

## 2019-07-13 NOTE — Anesthesia Preprocedure Evaluation (Addendum)
Anesthesia Evaluation  Patient identified by MRN, date of birth, ID band Patient awake    Reviewed: Allergy & Precautions, H&P , NPO status , reviewed documented beta blocker date and time   Airway Mallampati: III  TM Distance: >3 FB Neck ROM: limited    Dental  (+) Poor Dentition, Chipped, Missing Extremely poor dentition, none reported as loose, advisory given:   Pulmonary shortness of breath, sleep apnea , pneumonia, resolved, COPD, Current Smoker and Patient abstained from smoking.,     + decreased breath sounds      Cardiovascular hypertension, + CAD and + Past MI  Normal cardiovascular exam  ECHO 05/2019  1. The left ventricle has low normal systolic function, with an ejection fraction of 50-55%. The cavity size was normal. There is moderately increased left ventricular wall thickness. Left ventricular diastolic Doppler parameters are consistent with  pseudonormalization. Elevated mean left atrial pressure No evidence of left ventricular regional wall motion abnormalities.  2. The right ventricle has moderately reduced systolic function. The cavity was mildly enlarged. There is no increase in right ventricular wall thickness. Right ventricular systolic pressure is moderately elevated with an estimated pressure of 56.7  mmHg.  3. Left atrial size was severely dilated.  4. The aortic valve is tricuspid.  5. The aorta is normal unless otherwise noted.  6. Mildly dilated pulmonary artery.  7. The inferior vena cava was dilated in size with <50% respiratory variability.  8. The interatrial septum was not well visualized.   Neuro/Psych PSYCHIATRIC DISORDERS Anxiety    GI/Hepatic GERD  Medicated and Controlled,  Endo/Other    Renal/GU DialysisRenal disease     Musculoskeletal   Abdominal   Peds  Hematology  (+) Blood dyscrasia, anemia ,   Anesthesia Other Findings Past Medical History: No date: (HFpEF) heart failure with  preserved ejection fraction ([place])     Comment:  a. 05/2019 Echo: EF 50-55%, diast dysfxn, RVSP 56.32mmHg,               Sev dil LA. Mildly dil PA. No date: Anemia No date: Anxiety No date: Arthritis No date: Colon polyps No date: COPD (chronic obstructive pulmonary disease) ([facility]) No date: Coronary artery disease     Comment:  a. 10/2013 NSTEMI/Cath: Severe 3VD-->CABG x 4 @ [facility]               01/2014 (LIMA->LAD, VG->OM1->OM2, VG->RCA); b. 11/2014 MV:               No isch/infarct; c. 05/2019 MV: EF 40% (50-55 by echo),               small,mild, fixed apical defect - ? atten vs infarct. No               ischemia->low risk. No date: ESRD (end stage renal disease) ([place])     Comment:  a. MWFSat HD No date: ETOH abuse     Comment:  a. 2 beers/night. No date: GERD (gastroesophageal reflux disease) No date: History of pneumonia No date: Hyperlipidemia No date: Hypertension No date: IgA nephropathy No date: Lower GI bleed     Comment:  a. Due to colon polyps. Status post resection of 14               polyps No date: Non-ST elevation MI (NSTEMI) ([place]) No date: PAF (paroxysmal atrial fibrillation) ([facility])     Comment:  a. Dx 05/2019. CHA2DS2VASc = 3-->Eliquis/Amio. No date: Rheumatoid arthritis ([place]) No date: Shortness  of breath No date: Sleep apnea No date: Tobacco abuse Past Surgical History: 10/28/2016: A/V FISTULAGRAM; Left     Comment:  Procedure: A/V Fistulagram;  Surgeon: [name], MD;                Location: [place] [facility];  Service: Cardiovascular;              Laterality: Left; 12/07/2018: A/V FISTULAGRAM; Left     Comment:  Procedure: A/V FISTULAGRAM;  Surgeon: [name], MD;               Location: [place] [facility];  Service: Cardiovascular;              Laterality: Left; 06/04/2019: A/V FISTULAGRAM; N/A     Comment:  Procedure: A/V Fistulagram- LEFT;  Surgeon: [name], MD;  Location: [place] [facility];  Service:               Cardiovascular;   Laterality: N/A; 10/28/2016: A/V SHUNT INTERVENTION; N/A     Comment:  Procedure: A/V Shunt Intervention;  Surgeon: [name], MD;  Location: [place] [facility];  Service:               Cardiovascular;  Laterality: N/A; 02/01/2019: A/V SHUNTOGRAM; Left     Comment:  Procedure: A/V SHUNTOGRAM;  Surgeon: [name], MD;                Location: [place] [facility];  Service: Cardiovascular;              Laterality: Left; No date: AV FISTULA PLACEMENT No date: CARDIAC CATHETERIZATION     Comment:  RCA 90% and calcified mid LAD 80% Stenosis 11/29/2013: CORONARY ARTERY BYPASS GRAFT; N/A     Comment:  Procedure: CORONARY ARTERY BYPASS GRAFTING (CABG) x 4               using endoscopically harvested right saphenous vein and               left internal mammary artery;  Surgeon: [name],               MD;  Location: [place] [facility];  Service: Open Heart Surgery;                Laterality: N/A; 2/15: dialysis catheterr 02/15/2019: DIALYSIS/PERMA CATHETER REMOVAL; N/A     Comment:  Procedure: DIALYSIS/PERMA CATHETER REMOVAL;  Surgeon:               [name], MD;  Location: [place] [facility];                Service: Cardiovascular;  Laterality: N/A; 12/14/2018: INSERTION OF DIALYSIS CATHETER; N/A     Comment:  Procedure: INSERTION OF DIALYSIS CATHETER ( [place] );               Surgeon: [name], MD;  Location: [facility];  Service:              Vascular;  Laterality: N/A; 11/29/2013: INTRAOPERATIVE TRANSESOPHAGEAL ECHOCARDIOGRAM; N/A     Comment:  Procedure: INTRAOPERATIVE TRANSESOPHAGEAL               ECHOCARDIOGRAM;  Surgeon: [name], MD;  Location:  [place] [facility];  Service: Open Heart Surgery;  Laterality: N/A; 06/12/2015: PERIPHERAL VASCULAR CATHETERIZATION; N/A     Comment:  Procedure: A/V Shuntogram/Fistulagram;  Surgeon: [name], MD;  Location: [place] [facility];  Service:               Cardiovascular;  Laterality: N/A; 06/12/2015:  PERIPHERAL VASCULAR CATHETERIZATION; Left     Comment:  Procedure: A/V Shunt Intervention;  Surgeon: [name], MD;  Location: [place] [facility];  Service:               Cardiovascular;  Laterality: Left; 14: RENAL BIOPSY; Left 12/14/2018: REVISON OF ARTERIOVENOUS FISTULA; Left     Comment:  Procedure: REVISON OF ARTERIOVENOUS FISTULA;  Surgeon:               [name], MD;  Location: [facility];  Service:               Vascular;  Laterality: Left;   Reproductive/Obstetrics                             Anesthesia Physical Anesthesia Plan  ASA: IV  Anesthesia Plan: General   Post-op Pain Management:    Induction: Intravenous  PONV Risk Score and Plan: Ondansetron and Treatment may vary due to age or medical condition  Airway Management Planned: LMA, Natural Airway and Nasal Cannula  Additional Equipment:   Intra-op Plan:   Post-operative Plan: Extubation in OR  Informed Consent: I have reviewed the patients History and Physical, chart, labs and discussed the procedure including the risks, benefits and alternatives for the proposed anesthesia with the patient or authorized representative who has indicated his/her understanding and acceptance.     Dental Advisory Given  Plan Discussed with: CRNA  Anesthesia Plan Comments: (LMA if required, otherwise [place]/Natural AW)       Anesthesia Quick Evaluation

## 2019-07-13 NOTE — Anesthesia Post-op Follow-up Note (Signed)
Anesthesia QCDR form completed.        

## 2019-07-13 NOTE — H&P (Signed)
[facility] Admission History & Physical  MRN : [id number]  Daryl Weeks is a 58 y.o. (06/22/1961) male who presents with chief complaint of "bleeding from dialysis access".  History of Present Illness:  The patient is a 58 year old male with multiple medical issues (see below) including end-stage renal disease well-known to our service.  Patient has a left upper extremity dialysis access who dialyzes on Monday / Wednesday / Friday.  The patient was recently seen in the [facility] emergency department on 06/30/2019 with a chief complaint of prolonged bleeding from his dialysis access.  The patient was noted to have some bleeding which had stopped in the ED from three needle sites on his access.  At that time, ED physician felt no infection was present.  Skin was intact.  Patient was discharged home.  During dialysis this AM, the patient was noted to have an infected left upper extremity access with prolonged bleeding.  His nephrologist personally contacted Dr. Lucky Cowboy in regard to this emergent situation. Patient with erythema, tenderness and wound to graft site. A plan to take the patient to the operating room today was made for ligation and excision.  Patient denies any fever, nausea vomiting.  He does note progressively worsening discomfort to the right upper extremity.  Denies any shortness of breath or chest pain.  Most recent procedure:  06/04/19: 1.   Left brachiobasilic/Artergraft jump graft arteriovenous fistula cannulation under ultrasound guidance 2.   Left arm fistulagram including central venogram 3.   Percutaneous transluminal angioplasty of the anastomosis of the jump graft to the basilic vein at the elbow with 7 mm diameter Lutonix drug coated balloon.  Patient presents today to the [facility]'s operating room to undergo ligation of his dialysis access, possible excision and possible temporary catheter  placement.  Current Facility-Administered Medications  Medication Dose Route Frequency Provider Last Rate Last Dose  . ceFAZolin (ANCEF) 1-4 GM/50ML-% IVPB           . ceFAZolin (ANCEF) IVPB 1 g/50 mL premix  1 g Intravenous On Call to [place], PA-C      . Chlorhexidine Gluconate Cloth 2 % PADS 6 each  6 each Topical Once ,  A, PA-C       And  . Chlorhexidine Gluconate Cloth 2 % PADS 6 each  6 each Topical Once , Janalyn Harder, PA-C       Past Medical History:  Diagnosis Date  . (HFpEF) heart failure with preserved ejection fraction ([place])    a. 05/2019 Echo: EF 50-55%, diast dysfxn, RVSP 56.44mmHg, Sev dil LA. Mildly dil PA.  [name] Anemia   . Anxiety   . Arthritis   . Colon polyps   . COPD (chronic obstructive pulmonary disease) ([place])   . Coronary artery disease    a. 10/2013 NSTEMI/Cath: Severe 3VD-->CABG x 4 @ [facility] 01/2014 (LIMA->LAD, VG->OM1->OM2, VG->RCA); b. 11/2014 MV: No isch/infarct; c. 05/2019 MV: EF 40% (50-55 by echo), small,mild, fixed apical defect - ? atten vs infarct. No ischemia->low risk.  . ESRD (end stage renal disease) ([place])    a. MWFSat HD  . ETOH abuse    a. 2 beers/night.  [name] GERD (gastroesophageal reflux disease)   . History of pneumonia   . Hyperlipidemia   . Hypertension   . IgA nephropathy   . Lower GI bleed    a. Due to colon polyps. Status post resection of 14 polyps  . Non-ST elevation  MI (NSTEMI) ([place])   . PAF (paroxysmal atrial fibrillation) ([place])    a. Dx 05/2019. CHA2DS2VASc = 3-->Eliquis/Amio.  [name] Rheumatoid arthritis ([place])   . Shortness of breath   . Sleep apnea   . Tobacco abuse    Past Surgical History:  Procedure Laterality Date  . A/V FISTULAGRAM Left 10/28/2016   Procedure: A/V Fistulagram;  Surgeon: [name], MD;  Location: [place] [facility];  Service: Cardiovascular;  Laterality: Left;  . A/V FISTULAGRAM Left 12/07/2018   Procedure: A/V FISTULAGRAM;  Surgeon: [name], MD;  Location: [place] [facility];  Service: Cardiovascular;  Laterality: Left;  . A/V FISTULAGRAM N/A 06/04/2019   Procedure: A/V Fistulagram- LEFT;  Surgeon: [name], MD;  Location: [place] [facility];  Service: Cardiovascular;  Laterality: N/A;  . A/V SHUNT INTERVENTION N/A 10/28/2016   Procedure: A/V Shunt Intervention;  Surgeon: [name], MD;  Location: [place] [facility];  Service: Cardiovascular;  Laterality: N/A;  . A/V SHUNTOGRAM Left 02/01/2019   Procedure: A/V SHUNTOGRAM;  Surgeon: [name], MD;  Location: [place] [facility];  Service: Cardiovascular;  Laterality: Left;  . AV FISTULA PLACEMENT    . CARDIAC CATHETERIZATION     RCA 90% and calcified mid LAD 80% Stenosis  . CORONARY ARTERY BYPASS GRAFT N/A 11/29/2013   Procedure: CORONARY ARTERY BYPASS GRAFTING (CABG) x 4 using endoscopically harvested right saphenous vein and left internal mammary artery;  Surgeon: [name], MD;  Location: [place] [facility];  Service: Open Heart Surgery;  Laterality: N/A;  . dialysis catheterr  2/15  . DIALYSIS/PERMA CATHETER REMOVAL N/A 02/15/2019   Procedure: DIALYSIS/PERMA CATHETER REMOVAL;  Surgeon: [name], MD;  Location: [place] [facility];  Service: Cardiovascular;  Laterality: N/A;  . INSERTION OF DIALYSIS CATHETER N/A 12/14/2018   Procedure: INSERTION OF DIALYSIS CATHETER ( [place] );  Surgeon: [name], MD;  Location: [facility];  Service: Vascular;  Laterality: N/A;  . INTRAOPERATIVE TRANSESOPHAGEAL ECHOCARDIOGRAM N/A 11/29/2013   Procedure: INTRAOPERATIVE TRANSESOPHAGEAL ECHOCARDIOGRAM;  Surgeon: [name], MD;  Location: [place] [facility];  Service: Open Heart Surgery;  Laterality: N/A;  . PERIPHERAL VASCULAR CATHETERIZATION N/A 06/12/2015   Procedure: A/V Shuntogram/Fistulagram;  Surgeon: [name], MD;  Location: [place] [facility];  Service: Cardiovascular;  Laterality: N/A;  . PERIPHERAL VASCULAR CATHETERIZATION Left 06/12/2015   Procedure: A/V Shunt Intervention;  Surgeon: [name], MD;   Location: [place] [facility];  Service: Cardiovascular;  Laterality: Left;  . RENAL BIOPSY Left 14  . REVISON OF ARTERIOVENOUS FISTULA Left 12/14/2018   Procedure: REVISON OF ARTERIOVENOUS FISTULA;  Surgeon: [name], MD;  Location: [facility];  Service: Vascular;  Laterality: Left;   Social History Social History   Tobacco Use  . Smoking status: Current Every Day Smoker    Packs/day: 0.50    Years: 32.00    Pack years: 16.00    Types: Cigarettes  . Smokeless tobacco: Never Used  . Tobacco comment: daily  Substance Use Topics  . Alcohol use: Yes    Alcohol/week: 14.0 standard drinks    Types: 14 Cans of beer per week    Comment: 2 beers/night  . Drug use: No   Family History Family History  Problem Relation Age of Onset  . Heart disease Father   . Heart disease Brother   . Healthy Sister   . Stroke Neg Hx   Denies family history of peripheral artery  disease, venous disease or bleeding/clotting disorders.  Allergies  Allergen Reactions  . Lisinopril    REVIEW OF SYSTEMS (Negative unless checked)  Constitutional: [] Weight loss  [] Fever  [] Chills Cardiac: [] Chest pain   [] Chest pressure   [] Palpitations   [] Shortness of breath when laying flat   [] Shortness of breath at rest   [] Shortness of breath with exertion. Vascular:  [] Pain in legs with walking   [] Pain in legs at rest   [] Pain in legs when laying flat   [] Claudication   [] Pain in feet when walking  [] Pain in feet at rest  [] Pain in feet when laying flat   [] History of DVT   [] Phlebitis   [x] Swelling in legs   [] Varicose veins   [] Non-healing ulcers Pulmonary:   [] Uses home oxygen   [] Productive cough   [] Hemoptysis   [] Wheeze  [] COPD   [] Asthma Neurologic:  [] Dizziness  [] Blackouts   [] Seizures   [] History of stroke   [] History of TIA  [] Aphasia   [] Temporary blindness   [] Dysphagia   [] Weakness or numbness in arms   [] Weakness or numbness in legs Musculoskeletal:  [] Arthritis   [] Joint swelling   [] Joint pain    [] Low back pain Hematologic:  [] Easy bruising  [] Easy bleeding   [] Hypercoagulable state   [] Anemic  [] Hepatitis Gastrointestinal:  [] Blood in stool   [] Vomiting blood  [] Gastroesophageal reflux/heartburn   [] Difficulty swallowing. Genitourinary:  [x] Chronic kidney disease   [] Difficult urination  [] Frequent urination  [] Burning with urination   [] Blood in urine Skin:  [] Rashes   [] Ulcers   [x] Wounds Psychological:  [] History of anxiety   []  History of major depression.  Physical Examination  Vitals:   07/13/19 1149 07/13/19 1159  BP: 135/76   Pulse: (!) 56   Resp: 16   Temp:  (!) 96.8 F (36 C)  TempSrc:  Temporal  SpO2: 96%    There is no height or weight on file to calculate BMI. Gen: WD/WN, NAD Head: [place]/AT, No temporalis wasting.  Ear/Nose/Throat: Hearing grossly intact, nares w/o erythema or drainage, oropharynx w/o Erythema/Exudate, Eyes: Sclera non-icteric, conjunctiva clear Neck: Supple, no nuchal rigidity.  No JVD.  Pulmonary:  Good air movement, no increased work of respiration or use of accessory muscles  Cardiac: RRR, normal S1, S2, no Murmurs, rubs or gallops. Vascular:  Vessel Right Left  Radial Palpable Palpable  Ulnar Palpable Palpable  Brachial Palpable Palpable  Carotid Palpable, without bruit Palpable, without bruit  Aorta Not palpable N/A  Femoral Palpable Palpable  Popliteal Palpable Palpable  PT Palpable Palpable  DP Palpable Palpable   Left Upper Extremity: Extremity soft. Hand warm. 2+ radial pulse. Graft site with erythema and wound. Palpable thrill and bruit noted.   Gastrointestinal: soft, non-tender/non-distended. No guarding/reflex. No masses, surgical incisions, or scars. Musculoskeletal: M/S 5/5 throughout.  No deformity or atrophy.  Mild edema Neurologic: Sensation grossly intact in extremities.  Symmetrical.  Speech is fluent. Motor exam as listed above. Psychiatric: Judgment intact, Mood & affect appropriate for pt's clinical  situation. Dermatologic: As above Lymph : No Cervical, Axillary, or Inguinal lymphadenopathy.  CBC Lab Results  Component Value Date   WBC 5.6 07/13/2019   HGB 9.1 (L) 07/13/2019   HCT 27.1 (L) 07/13/2019   MCV 106.3 (H) 07/13/2019   PLT 250 07/13/2019   BMET    Component Value Date/Time   NA 135 07/13/2019 1125   NA 132 (L) 06/24/2014 0903   K 3.7 07/13/2019 1125   K 4.8 07/11/2014 1046  CL 97 (L) 07/13/2019 1125   CL 96 (L) 06/24/2014 0903   CO2 28 07/13/2019 1125   CO2 21 06/24/2014 0903   GLUCOSE 101 (H) 07/13/2019 1125   GLUCOSE 92 06/24/2014 0903   BUN 11 07/13/2019 1125   BUN 60 (H) 06/24/2014 0903   CREATININE 3.72 (H) 07/13/2019 1125   CREATININE 9.10 (H) 06/24/2014 0903   CALCIUM 8.2 (L) 07/13/2019 1125   CALCIUM 8.8 06/24/2014 0903   GFRNONAA 17 (L) 07/13/2019 1125   GFRNONAA 7 (L) 06/24/2014 0903   GFRNONAA 11 (L) 01/14/2014 1051   GFRAA 20 (L) 07/13/2019 1125   GFRAA 8 (L) 06/24/2014 0903   GFRAA 12 (L) 01/14/2014 1051   Estimated Creatinine Clearance: 20.7 mL/min (A) (by C-G formula based on SCr of 3.72 mg/dL (H)).  COAG Lab Results  Component Value Date   INR 1.5 (H) 07/13/2019   INR 1.1 05/30/2019   INR 1.0 12/14/2018   Radiology No results found.  Assessment/Plan The patient is a 58 year old male with multiple medical issues (see below) including end-stage renal disease well-known to our service.  Patient has a left upper extremity dialysis access who dialyzes on Monday / Wednesday / Friday.  1. Infected AV Graft: Patient presents with progressively worsening prolonged bleeding after dialysis to left upper extremity dialysis access.  At dialysis this a.m., the graft site was noted to be erythematous, tender to palpation with a wound present.  An attempt to avoid sepsis due to the possibility of an infected graft / now with wound formation we will plan for excision and place a temporary dialysis catheter.  Procedure, risks and benefits explained  to the patient.  All questions answered.  The patient wished to proceed. 2. ESRD: With the ligation and excision of the patient's left upper extremity dialysis graft he will do with his dialysis access.  We will place a temporary catheter to allow the infection to clear with the plan of placing a PermCath on Monday.  Procedure, risks and benefits explained to the patient.  All questions answered.  The patient wishes to proceed. 3. Tobacco Abuse: We had a discussion for approximately three minutes regarding the absolute need for smoking cessation due to the deleterious nature of tobacco on the vascular system. We discussed the tobacco use would diminish patency of any intervention, and likely significantly worsen progressio of disease. We discussed multiple agents for quitting including replacement therapy or medications to reduce cravings such as Chantix. The patient voices their understanding of the importance of smoking cessation. 4. Hyperlipidemia: On statin for medical management. Encouraged good control as its slows the progression of atherosclerotic disease  Discussed with Dr. Mayme Genta, PA-C  07/13/2019 12:50 PM

## 2019-07-13 NOTE — Consult Note (Signed)
Pharmacy Antibiotic Note  DUGAN WACHTLER is a 58 y.o. male admitted on 07/13/2019 with bleeding from dialysis access site. During dialysis this AM, the patient was noted to have an infected left upper extremity access with prolonged bleeding. Pharmacy has been consulted for Zosyn dosing.  Plan: Zosyn 3.375g IV q12h (4 hour infusion).     Temp (24hrs), Avg:96.8 F (36 C), Min:96.8 F (36 C), Max:96.8 F (36 C)  Recent Labs  Lab 07/13/19 1125  WBC 5.6  CREATININE 3.72*    Estimated Creatinine Clearance: 20.7 mL/min (A) (by C-G formula based on SCr of 3.72 mg/dL (H)).    Allergies  Allergen Reactions  . Lisinopril     Antimicrobials this admission: Zosyn 10/30 >>   Microbiology results: 10/30 SARS CoV-2: negative   Thank you for allowing pharmacy to be a part of this patient's care.  Dallie Piles, PharmD 07/13/2019 3:57 PM

## 2019-07-13 NOTE — OR Nursing (Signed)
Left hand with all warm fingers to the touch and less than 3 second cap. Refill and 2+ radial pulse.

## 2019-07-13 NOTE — Anesthesia Postprocedure Evaluation (Signed)
Anesthesia Post Note  Patient: Daryl Weeks  Procedure(s) Performed: LIGATION OF ARTERIOVENOUS  FISTULA (Left )  Patient location during evaluation: PACU Anesthesia Type: General Level of consciousness: awake and alert Pain management: pain level controlled Vital Signs Assessment: post-procedure vital signs reviewed and stable Respiratory status: spontaneous breathing, nonlabored ventilation and respiratory function stable Cardiovascular status: blood pressure returned to baseline and stable Postop Assessment: no apparent nausea or vomiting Anesthetic complications: no     Last Vitals:  Vitals:   07/13/19 1628 07/13/19 1648  BP: (!) 161/80 127/79  Pulse: 63 66  Resp: 13   Temp: (!) 36.2 C 36.6 C  SpO2: 97% 93%    Last Pain:  Vitals:   07/13/19 1648  TempSrc: Oral  PainSc:                  [name]

## 2019-07-14 ENCOUNTER — Encounter: Payer: Self-pay | Admitting: Vascular Surgery

## 2019-07-14 DIAGNOSIS — Z992 Dependence on renal dialysis: Secondary | ICD-10-CM | POA: Diagnosis not present

## 2019-07-14 DIAGNOSIS — N186 End stage renal disease: Secondary | ICD-10-CM | POA: Diagnosis not present

## 2019-07-14 LAB — BASIC METABOLIC PANEL
Anion gap: 10 (ref 5–15)
BUN: 20 mg/dL (ref 6–20)
CO2: 29 mmol/L (ref 22–32)
Calcium: 8.4 mg/dL — ABNORMAL LOW (ref 8.9–10.3)
Chloride: 93 mmol/L — ABNORMAL LOW (ref 98–111)
Creatinine, Ser: 6.14 mg/dL — ABNORMAL HIGH (ref 0.61–1.24)
GFR calc Af Amer: 11 mL/min — ABNORMAL LOW (ref >60)
GFR calc non Af Amer: 9 mL/min — ABNORMAL LOW (ref >60)
Glucose, Bld: 124 mg/dL — ABNORMAL HIGH (ref 70–99)
Potassium: 5.1 mmol/L (ref 3.5–5.1)
Sodium: 132 mmol/L — ABNORMAL LOW (ref 135–145)

## 2019-07-14 LAB — CBC
HCT: 25.1 % — ABNORMAL LOW (ref 39.0–52.0)
Hemoglobin: 8.5 g/dL — ABNORMAL LOW (ref 13.0–17.0)
MCH: 35.6 pg — ABNORMAL HIGH (ref 26.0–34.0)
MCHC: 33.9 g/dL (ref 30.0–36.0)
MCV: 105 fL — ABNORMAL HIGH (ref 80.0–100.0)
Platelets: 267 10*3/uL (ref 150–400)
RBC: 2.39 MIL/uL — ABNORMAL LOW (ref 4.22–5.81)
RDW: 15.8 % — ABNORMAL HIGH (ref 11.5–15.5)
WBC: 8.5 10*3/uL (ref 4.0–10.5)
nRBC: 0 % (ref 0.0–0.2)

## 2019-07-14 LAB — HEMOGLOBIN AND HEMATOCRIT, BLOOD
HCT: 24 % — ABNORMAL LOW (ref 39.0–52.0)
Hemoglobin: 7.9 g/dL — ABNORMAL LOW (ref 13.0–17.0)

## 2019-07-14 LAB — MAGNESIUM: Magnesium: 2.2 mg/dL (ref 1.7–2.4)

## 2019-07-14 MED ORDER — APIXABAN 5 MG PO TABS
5.0000 mg | ORAL_TABLET | Freq: Two times a day (BID) | ORAL | Status: DC
  Administered 2019-07-15 – 2019-07-17 (×3): 5 mg via ORAL
  Filled 2019-07-14 (×3): qty 1

## 2019-07-14 MED ORDER — GELATIN ABSORBABLE 12-7 MM EX MISC
CUTANEOUS | Status: AC
  Filled 2019-07-14: qty 1

## 2019-07-14 MED ORDER — GELATIN ABSORBABLE 100 CM EX MISC
CUTANEOUS | Status: AC
  Filled 2019-07-14: qty 1

## 2019-07-14 NOTE — Plan of Care (Signed)
Patient's left arm leaking sanguineous drainage from wound vac throughout the night. Wrapped in ABD pads and the two pads became saturated. Notified MD and he stated to reinforce with ACE wrap. MD stated he would see patient when he rounds in the morning. Patient denies any complaints at this time. Will continue to monitor.

## 2019-07-14 NOTE — Progress Notes (Signed)
Patient ID: Daryl Weeks, male   DOB: [birth year], 58 y.o.   MRN: [id number] Patient seen for recurrent bleeding from the left forearm. VAC dressing removed per Dr. Lucky Cowboy and W/D dressing applied.  The wound was examined and two small points of bleeding were identified.  Gel foam was then applied to the areas, followed by wet sterile 4x4 and gauze. Then Kerlix and an Ace wrap were applied in a snug fashion. HxH is pending.

## 2019-07-14 NOTE — Progress Notes (Signed)
1 Day Post-Op   Subjective/Chief Complaint: Patient with nonfunctional VAC.  Bleeding through the night from dressings.    Objective: Vital signs in last 24 hours: Temp:  [96.8 F (36 C)-98.3 F (36.8 C)] 98.2 F (36.8 C) (10/31 0414) Pulse Rate:  [60-69] 68 (10/31 0414) Resp:  [10-20] 14 (10/31 0414) BP: (113-199)/(58-92) 123/67 (10/31 0414) SpO2:  [92 %-100 %] 92 % (10/31 0414) Weight:  [73.5 kg] 73.5 kg (10/31 0541) Last BM Date: 07/09/19  Intake/Output from previous day: 10/30 0701 - 10/31 0700 In: [id number] [P.O.:597; I.V.:516; IV Piggyback:450] Out: 235 [Drains:185; Blood:50] Intake/Output this shift: No intake/output data recorded.  General appearance: alert, cooperative and appears stated age Head: Normocephalic, without obvious abnormality, atraumatic Eyes: conjunctivae/corneas clear. PERRL, EOM's intact. Fundi benign. Neck: no adenopathy, no carotid bruit, no JVD, supple, symmetrical, trachea midline and thyroid not enlarged, symmetric, no tenderness/mass/nodules Resp: clear to auscultation bilaterally Extremities: extremities normal, atraumatic, no cyanosis or edema and left arm VAC dressing seems to be occluded and vaccum is abscent.  Incision/Wound:  Lab Results:  Recent Labs    07/13/19 1125 07/14/19 0730  WBC 5.6 8.5  HGB 9.1* 8.5*  HCT 27.1* 25.1*  PLT 250 267   BMET Recent Labs    07/13/19 1125 07/14/19 0730  NA 135 132*  K 3.7 5.1  CL 97* 93*  CO2 28 29  GLUCOSE 101* 124*  BUN 11 20  CREATININE 3.72* 6.14*  CALCIUM 8.2* 8.4*   PT/INR Recent Labs    07/13/19 1125  LABPROT 17.5*  INR 1.5*   ABG No results for input(s): PHART, HCO3 in the last 72 hours.  Invalid input(s): PCO2, PO2  Studies/Results: No results found.  Anti-infectives: Anti-infectives (From admission, onward)   Start     Dose/Rate Route Frequency Ordered Stop   07/13/19 2200  piperacillin-tazobactam (ZOSYN) IVPB 3.375 g     3.375 g 12.5 mL/hr over 240 Minutes  Intravenous Every 12 hours 07/13/19 1601     07/13/19 1215  ceFAZolin (ANCEF) 1-4 GM/50ML-% IVPB    Note to Pharmacy: Leonia Reader   : cabinet override      07/13/19 1215 07/13/19 1350   07/13/19 1115  ceFAZolin (ANCEF) IVPB 1 g/50 mL premix     1 g 100 mL/hr over 30 Minutes Intravenous On call to O.R. 07/13/19 1114 07/13/19 1350      Assessment/Plan: s/p Procedure(s): LIGATION OF ARTERIOVENOUS  FISTULA (Left) Continue with V.A.C. and wound care.  Made repairs of the wound VAC.  Placed new suction tubing. Patient tolerated the procedure well.   LOS: 1 day    [name] Guise 07/14/2019

## 2019-07-14 NOTE — Progress Notes (Signed)
[facility], [place] 07/14/19  Subjective:   Patient known to our practice from outpatient dialysis Presented for malfunction of left arm AV graft.  Patient was able to complete his dialysis yesterday as outpatient He was emergently taken to the operating room and graft was excised Currently has a wound VAC Patient is able to eat without any nausea or vomiting No complaints of shortness of breath  Objective:  Vital signs in last 24 hours:  Temp:  [96.8 F (36 C)-98.3 F (36.8 C)] 98.2 F (36.8 C) (10/31 0414) Pulse Rate:  [56-69] 68 (10/31 0414) Resp:  [10-20] 14 (10/31 0414) BP: (113-199)/(58-92) 123/67 (10/31 0414) SpO2:  [92 %-100 %] 92 % (10/31 0414) Weight:  [73.5 kg] 73.5 kg (10/31 0541)  Weight change:  Filed Weights   07/14/19 0541  Weight: 73.5 kg    Intake/Output:    Intake/Output Summary (Last 24 hours) at 07/14/2019 1101 Last data filed at 07/14/2019 0400 Gross per 24 hour  Intake 1563 ml  Output 235 ml  Net 1328 ml    Gen:   Alert, cooperative, no distress, appears older than stated age Head:   Normocephalic, without obvious abnormality, atraumatic Eyes/ENT:  Conjunctiva clear,  moist oral mucus membranes Neck:  Supple,  thyroid: not enlarged, no JVD Lungs:   Clear to auscultation bilaterally, respirations unlabored Heart:   Regular rate and rhythm, S1, S2 normal, no  rub or gallop Abdomen:   Soft, non-tender, bowel sounds active  Extremities: no cyanosis or edema Skin:  turgor normal,  Neurologic: Alert and oriented, able to answer questions appropriately Left arm wound VAC     Basic Metabolic Panel:  Recent Labs  Lab 07/13/19 1125 07/14/19 0730  NA 135 132*  K 3.7 5.1  CL 97* 93*  CO2 28 29  GLUCOSE 101* 124*  BUN 11 20  CREATININE 3.72* 6.14*  CALCIUM 8.2* 8.4*  MG  --  2.2     CBC: Recent Labs  Lab 07/13/19 1125 07/14/19 0730  WBC 5.6 8.5  NEUTROABS 2.8  --   HGB 9.1* 8.5*  HCT 27.1* 25.1*   MCV 106.3* 105.0*  PLT 250 267      Lab Results  Component Value Date   HEPBSAG Negative 06/04/2019      Microbiology:  Recent Results (from the past 240 hour(s))  SARS Coronavirus 2 by RT PCR (hospital order, performed in [place] hospital lab) Nasopharyngeal Nasopharyngeal Swab     Status: None   Collection Time: 07/13/19 11:29 AM   Specimen: Nasopharyngeal Swab  Result Value Ref Range Status   SARS Coronavirus 2 NEGATIVE NEGATIVE Final    Comment: (NOTE) If result is NEGATIVE SARS-CoV-2 target nucleic acids are NOT DETECTED. The SARS-CoV-2 RNA is generally detectable in upper and lower  respiratory specimens during the acute phase of infection. The lowest  concentration of SARS-CoV-2 viral copies this assay can detect is 250  copies / mL. A negative result does not preclude SARS-CoV-2 infection  and should not be used as the sole basis for treatment or other  patient management decisions.  A negative result may occur with  improper specimen collection / handling, submission of specimen other  than nasopharyngeal swab, presence of viral mutation(s) within the  areas targeted by this assay, and inadequate number of viral copies  (<250 copies / mL). A negative result must be combined with clinical  observations, patient history, and epidemiological information. If result is POSITIVE SARS-CoV-2 target nucleic acids are  DETECTED. The SARS-CoV-2 RNA is generally detectable in upper and lower  respiratory specimens dur ing the acute phase of infection.  Positive  results are indicative of active infection with SARS-CoV-2.  Clinical  correlation with patient history and other diagnostic information is  necessary to determine patient infection status.  Positive results do  not rule out bacterial infection or co-infection with other viruses. If result is PRESUMPTIVE POSTIVE SARS-CoV-2 nucleic acids MAY BE PRESENT.   A presumptive positive result was obtained on the  submitted specimen  and confirmed on repeat testing.  While 2019 novel coronavirus  (SARS-CoV-2) nucleic acids may be present in the submitted sample  additional confirmatory testing may be necessary for epidemiological  and / or clinical management purposes  to differentiate between  SARS-CoV-2 and other Sarbecovirus currently known to infect humans.  If clinically indicated additional testing with an alternate test  methodology [phone]) is advised. The SARS-CoV-2 RNA is generally  detectable in upper and lower respiratory sp ecimens during the acute  phase of infection. The expected result is Negative. Fact Sheet for Patients:  StrictlyIdeas.no Fact Sheet for Healthcare Providers: BankingDealers.co.za This test is not yet approved or cleared by the [place] FDA and has been authorized for detection and/or diagnosis of SARS-CoV-2 by FDA under an Emergency Use Authorization (EUA).  This EUA will remain in effect (meaning this test can be used) for the duration of the COVID-19 declaration under Section 564(b)(1) of the Act, 21 U.S.C. section 360bbb-3(b)(1), unless the authorization is terminated or revoked sooner. Performed at [facility], [address] [place], [place] [address]   Culture, blood (single) w Reflex to ID Panel     Status: None (Preliminary result)   Collection Time: 07/13/19  4:47 PM   Specimen: BLOOD  Result Value Ref Range Status   Specimen Description BLOOD RAC  Final   Special Requests   Final    BOTTLES DRAWN AEROBIC AND ANAEROBIC Blood Culture results may not be optimal due to an excessive volume of blood received in culture bottles   Culture   Final    NO GROWTH < 12 HOURS Performed at [facility], [address], [place], [place] [address]    Report Status PENDING  Incomplete    Coagulation Studies: Recent Labs    07/13/19 1125  LABPROT 17.5*  INR 1.5*    Urinalysis: No  results for input(s): COLORURINE, LABSPEC, PHURINE, GLUCOSEU, HGBUR, BILIRUBINUR, KETONESUR, PROTEINUR, UROBILINOGEN, NITRITE, LEUKOCYTESUR in the last 72 hours.  Invalid input(s): APPERANCEUR    Imaging: No results found.   Medications:   . piperacillin-tazobactam (ZOSYN)  IV 3.375 g (07/14/19 1058)   . amiodarone  200 mg Oral BID  . amLODipine  10 mg Oral Daily  . apixaban  5 mg Oral BID  . aspirin EC  81 mg Oral Daily  . atorvastatin  20 mg Oral q1800  . budesonide  0.25 mg Nebulization BID  . calcium carbonate  1 tablet Oral TID WC  . carvedilol  25 mg Oral BID WC  . cinacalcet  30 mg Oral Q supper  . cloNIDine  0.2 mg Oral TID  . ferric citrate  210 mg Oral TID WC  . folic acid  1 mg Oral Daily  . furosemide  80 mg Oral BID  . gabapentin  100 mg Oral TID  . hydrALAZINE  100 mg Oral Q6H  . irbesartan  300 mg Oral QHS  . loratadine  10 mg Oral Daily  .  multivitamin  1 tablet Oral Daily  . multivitamin with minerals  1 tablet Oral Daily  . nicotine  21 mg Transdermal Daily  . sevelamer carbonate  2,400 mg Oral Daily  . thiamine  100 mg Oral Daily   Or  . thiamine  100 mg Intravenous Daily  . tiotropium  18 mcg Inhalation Daily  . zolpidem  10 mg Oral QHS   acetaminophen **OR** acetaminophen, albuterol, docusate sodium, HYDROcodone-acetaminophen, LORazepam **OR** LORazepam, magnesium hydroxide, morphine injection, morphine injection, nitroGLYCERIN, ondansetron **OR** ondansetron (ZOFRAN) IV, sodium phosphate, sorbitol  Assessment/ Plan:  58 y.o. male with  ESRD, CAD/ CABG 2015, CHF, sleep apnea, alcohol abuse, current smoker, history of chronic lacunar infarcts within left frontal lobe white matter and left basal ganglia  [facility] MWF-1// TW 69 kg  #End-stage renal disease with complication of dialysis access -Patient has malfunction of Artegraft.  It was emergently removed on October 30. -Patient currently does not have any dialysis access.   Catheter free for the weekend. -Plan for PermCath placement on Monday and dialysis after access is available. -Advised patient to follow strict fluid restriction  #Anemia of chronic kidney disease - Lab Results  Component Value Date   HGB 8.5 (L) 07/14/2019   Monitor hemoglobin.  Continue low-dose Epogen with hemodialysis  #Secondary hyperparathyroidism Lab Results  Component Value Date   CALCIUM 8.4 (L) 07/14/2019   CAION 1.12 11/30/2013   PHOS 4.0 05/31/2019   Continue home dose of binders  #Left arm wound with wound VAC -Wound care as per vascular surgery team.   LOS: [place] 10/31/202011:01 AM  [place], [place]  Note: This note was prepared with Dragon dictation. Any transcription errors are unintentional

## 2019-07-15 DIAGNOSIS — N186 End stage renal disease: Secondary | ICD-10-CM | POA: Diagnosis not present

## 2019-07-15 DIAGNOSIS — N2581 Secondary hyperparathyroidism of renal origin: Secondary | ICD-10-CM | POA: Diagnosis not present

## 2019-07-15 DIAGNOSIS — D509 Iron deficiency anemia, unspecified: Secondary | ICD-10-CM | POA: Diagnosis not present

## 2019-07-15 DIAGNOSIS — R110 Nausea: Secondary | ICD-10-CM | POA: Diagnosis not present

## 2019-07-15 DIAGNOSIS — R1110 Vomiting, unspecified: Secondary | ICD-10-CM | POA: Diagnosis not present

## 2019-07-15 DIAGNOSIS — Z992 Dependence on renal dialysis: Secondary | ICD-10-CM | POA: Diagnosis not present

## 2019-07-15 LAB — BASIC METABOLIC PANEL
Anion gap: 12 (ref 5–15)
BUN: 32 mg/dL — ABNORMAL HIGH (ref 6–20)
CO2: 29 mmol/L (ref 22–32)
Calcium: 8 mg/dL — ABNORMAL LOW (ref 8.9–10.3)
Chloride: 92 mmol/L — ABNORMAL LOW (ref 98–111)
Creatinine, Ser: 8.35 mg/dL — ABNORMAL HIGH (ref 0.61–1.24)
GFR calc Af Amer: 7 mL/min — ABNORMAL LOW (ref >60)
GFR calc non Af Amer: 6 mL/min — ABNORMAL LOW (ref >60)
Glucose, Bld: 101 mg/dL — ABNORMAL HIGH (ref 70–99)
Potassium: 4.8 mmol/L (ref 3.5–5.1)
Sodium: 133 mmol/L — ABNORMAL LOW (ref 135–145)

## 2019-07-15 LAB — CBC
HCT: 22.4 % — ABNORMAL LOW (ref 39.0–52.0)
Hemoglobin: 7.4 g/dL — ABNORMAL LOW (ref 13.0–17.0)
MCH: 34.9 pg — ABNORMAL HIGH (ref 26.0–34.0)
MCHC: 33 g/dL (ref 30.0–36.0)
MCV: 105.7 fL — ABNORMAL HIGH (ref 80.0–100.0)
Platelets: 271 10*3/uL (ref 150–400)
RBC: 2.12 MIL/uL — ABNORMAL LOW (ref 4.22–5.81)
RDW: 15.7 % — ABNORMAL HIGH (ref 11.5–15.5)
WBC: 10.4 10*3/uL (ref 4.0–10.5)
nRBC: 0 % (ref 0.0–0.2)

## 2019-07-15 MED ORDER — EPOETIN ALFA 10000 UNIT/ML IJ SOLN: 4000.0000 [IU] | INTRAMUSCULAR | Status: DC

## 2019-07-15 MED ORDER — SILVER NITRATE-POT NITRATE 75-25 % EX MISC
3.0000 | Freq: Once | CUTANEOUS | Status: AC
  Administered 2019-07-15: 3 via TOPICAL
  Filled 2019-07-15: qty 3

## 2019-07-15 MED ORDER — CHLORHEXIDINE GLUCONATE CLOTH 2 % EX PADS
6.0000 | MEDICATED_PAD | Freq: Every day | CUTANEOUS | Status: DC
  Administered 2019-07-17: 06:00:00 6 via TOPICAL

## 2019-07-15 NOTE — Progress Notes (Signed)
[facility], [place] 07/15/19  Subjective:   Patient known to our practice from outpatient dialysis Presented for malfunction of left arm AV graf and infected hematoma He was emergently taken to the operating room and graft was excised Patient is able to eat without any nausea or vomiting No complaints of shortness of breath  Objective:  Vital signs in last 24 hours:  Temp:  [97.4 F (36.3 C)-98 F (36.7 C)] 97.4 F (36.3 C) (11/01 1242) Pulse Rate:  [56-63] 56 (11/01 1242) Resp:  [16-20] 16 (11/01 1242) BP: (107-122)/(62-67) 113/67 (11/01 1242) SpO2:  [96 %-99 %] 99 % (11/01 1242)  Weight change:  Filed Weights   07/14/19 0541  Weight: 73.5 kg    Intake/Output:    Intake/Output Summary (Last 24 hours) at 07/15/2019 1342 Last data filed at 07/14/2019 2130 Gross per 24 hour  Intake 300.24 ml  Output -  Net 300.24 ml    Gen:   Alert, cooperative, no distress, appears older than stated age Head:   Normocephalic, without obvious abnormality, atraumatic Eyes/ENT:  Conjunctiva clear,  moist oral mucus membranes Neck:  Supple,  no JVD Lungs:   Clear to auscultation bilaterally, respirations unlabored Heart:   Regular rate and rhythm, 2/6 murmur Abdomen:   Soft, non-tender, bowel sounds active  Extremities: no cyanosis or edema Skin:  turgor normal,  Neurologic: Alert and oriented, able to answer questions appropriately Left arm bandaged     Basic Metabolic Panel:  Recent Labs  Lab 07/13/19 1125 07/14/19 0730 07/15/19 0630  NA 135 132* 133*  K 3.7 5.1 4.8  CL 97* 93* 92*  CO2 28 29 29   GLUCOSE 101* 124* 101*  BUN 11 20 32*  CREATININE 3.72* 6.14* 8.35*  CALCIUM 8.2* 8.4* 8.0*  MG  --  2.2  --      CBC: Recent Labs  Lab 07/13/19 1125 07/14/19 0730 07/14/19 1639 07/15/19 0630  WBC 5.6 8.5  --  10.4  NEUTROABS 2.8  --   --   --   HGB 9.1* 8.5* 7.9* 7.4*  HCT 27.1* 25.1* 24.0* 22.4*  MCV 106.3* 105.0*  --  105.7*  PLT  250 267  --  271      Lab Results  Component Value Date   HEPBSAG Negative 06/04/2019      Microbiology:  Recent Results (from the past 240 hour(s))  SARS Coronavirus 2 by RT PCR (hospital order, performed in [place] hospital lab) Nasopharyngeal Nasopharyngeal Swab     Status: None   Collection Time: 07/13/19 11:29 AM   Specimen: Nasopharyngeal Swab  Result Value Ref Range Status   SARS Coronavirus 2 NEGATIVE NEGATIVE Final    Comment: (NOTE) If result is NEGATIVE SARS-CoV-2 target nucleic acids are NOT DETECTED. The SARS-CoV-2 RNA is generally detectable in upper and lower  respiratory specimens during the acute phase of infection. The lowest  concentration of SARS-CoV-2 viral copies this assay can detect is 250  copies / mL. A negative result does not preclude SARS-CoV-2 infection  and should not be used as the sole basis for treatment or other  patient management decisions.  A negative result may occur with  improper specimen collection / handling, submission of specimen other  than nasopharyngeal swab, presence of viral mutation(s) within the  areas targeted by this assay, and inadequate number of viral copies  (<250 copies / mL). A negative result must be combined with clinical  observations, patient history, and epidemiological information. If result  is POSITIVE SARS-CoV-2 target nucleic acids are DETECTED. The SARS-CoV-2 RNA is generally detectable in upper and lower  respiratory specimens dur ing the acute phase of infection.  Positive  results are indicative of active infection with SARS-CoV-2.  Clinical  correlation with patient history and other diagnostic information is  necessary to determine patient infection status.  Positive results do  not rule out bacterial infection or co-infection with other viruses. If result is PRESUMPTIVE POSTIVE SARS-CoV-2 nucleic acids MAY BE PRESENT.   A presumptive positive result was obtained on the submitted specimen  and  confirmed on repeat testing.  While 2019 novel coronavirus  (SARS-CoV-2) nucleic acids may be present in the submitted sample  additional confirmatory testing may be necessary for epidemiological  and / or clinical management purposes  to differentiate between  SARS-CoV-2 and other Sarbecovirus currently known to infect humans.  If clinically indicated additional testing with an alternate test  methodology [phone]) is advised. The SARS-CoV-2 RNA is generally  detectable in upper and lower respiratory sp ecimens during the acute  phase of infection. The expected result is Negative. Fact Sheet for Patients:  StrictlyIdeas.no Fact Sheet for Healthcare Providers: BankingDealers.co.za This test is not yet approved or cleared by the [place] FDA and has been authorized for detection and/or diagnosis of SARS-CoV-2 by FDA under an Emergency Use Authorization (EUA).  This EUA will remain in effect (meaning this test can be used) for the duration of the COVID-19 declaration under Section 564(b)(1) of the Act, 21 U.S.C. section 360bbb-3(b)(1), unless the authorization is terminated or revoked sooner. Performed at [facility], [address] [place], [place] [address]   Culture, blood (single) w Reflex to ID Panel     Status: None (Preliminary result)   Collection Time: 07/13/19  4:47 PM   Specimen: BLOOD  Result Value Ref Range Status   Specimen Description BLOOD RAC  Final   Special Requests   Final    BOTTLES DRAWN AEROBIC AND ANAEROBIC Blood Culture results may not be optimal due to an excessive volume of blood received in culture bottles   Culture   Final    NO GROWTH 2 DAYS Performed at [facility], [address], [place], [place] [address]    Report Status PENDING  Incomplete    Coagulation Studies: Recent Labs    07/13/19 1125  LABPROT 17.5*  INR 1.5*    Urinalysis: No results for input(s):  COLORURINE, LABSPEC, PHURINE, GLUCOSEU, HGBUR, BILIRUBINUR, KETONESUR, PROTEINUR, UROBILINOGEN, NITRITE, LEUKOCYTESUR in the last 72 hours.  Invalid input(s): APPERANCEUR    Imaging: No results found.   Medications:   . piperacillin-tazobactam (ZOSYN)  IV 3.375 g (07/15/19 0922)   . amiodarone  200 mg Oral BID  . amLODipine  10 mg Oral Daily  . apixaban  5 mg Oral BID  . aspirin EC  81 mg Oral Daily  . atorvastatin  20 mg Oral q1800  . budesonide  0.25 mg Nebulization BID  . calcium carbonate  1 tablet Oral TID WC  . carvedilol  25 mg Oral BID WC  . cinacalcet  30 mg Oral Q supper  . cloNIDine  0.2 mg Oral TID  . ferric citrate  210 mg Oral TID WC  . folic acid  1 mg Oral Daily  . furosemide  80 mg Oral BID  . gabapentin  100 mg Oral TID  . hydrALAZINE  100 mg Oral Q6H  . irbesartan  300 mg Oral QHS  .  loratadine  10 mg Oral Daily  . multivitamin  1 tablet Oral Daily  . multivitamin with minerals  1 tablet Oral Daily  . nicotine  21 mg Transdermal Daily  . sevelamer carbonate  2,400 mg Oral Daily  . thiamine  100 mg Oral Daily   Or  . thiamine  100 mg Intravenous Daily  . tiotropium  18 mcg Inhalation Daily  . zolpidem  10 mg Oral QHS   acetaminophen **OR** acetaminophen, albuterol, docusate sodium, HYDROcodone-acetaminophen, LORazepam **OR** LORazepam, magnesium hydroxide, morphine injection, morphine injection, nitroGLYCERIN, ondansetron **OR** ondansetron (ZOFRAN) IV, sodium phosphate, sorbitol  Assessment/ Plan:  58 y.o. male with  ESRD, CAD/ CABG 2015, CHF, sleep apnea, alcohol abuse, current smoker, history of chronic lacunar infarcts within left frontal lobe white matter and left basal ganglia  [facility] MWF-1// TW 69 kg  #End-stage renal disease with complication of dialysis access -Patient has malfunction of Artegraft.  It was emergently removed on October 30. -Patient currently does not have any dialysis access.  Catheter free for the  weekend. -Plan for PermCath placement on Monday and dialysis after access is available. -Advised patient to follow strict fluid restriction  #Anemia of chronic kidney disease - Lab Results  Component Value Date   HGB 7.4 (L) 07/15/2019   Monitor hemoglobin.  Continue low-dose Epogen with hemodialysis  #Secondary hyperparathyroidism Lab Results  Component Value Date   CALCIUM 8.0 (L) 07/15/2019   CAION 1.12 11/30/2013   PHOS 4.0 05/31/2019   Continue home dose of binders  #Left arm wound  -Wound care as per vascular surgery team.   LOS: 2 Carlus Stay 11/1/20201:42 PM  [place], [place]  Note: This note was prepared with Dragon dictation. Any transcription errors are unintentional

## 2019-07-15 NOTE — Progress Notes (Signed)
2 Days Post-Op   Subjective/Chief Complaint  Patient is comfortable,but continues to ooze from his wound  Objective: Vital signs in last 24 hours: Temp:  [97.4 F (36.3 C)-98 F (36.7 C)] 98 F (36.7 C) (11/01 0434) Pulse Rate:  [57-63] 63 (11/01 0434) Resp:  [20] 20 (11/01 0434) BP: (101-122)/(62-67) 119/67 (11/01 0434) SpO2:  [96 %-97 %] 96 % (11/01 0434) Last BM Date: 07/09/19  Intake/Output from previous day: 10/31 0701 - 11/01 0700 In: 300.2 [P.O.:200; IV Piggyback:100.2] Out: -  Intake/Output this shift: No intake/output data recorded.  General appearance: alert, cooperative and appears stated age Head: Normocephalic, without obvious abnormality, atraumatic Eyes: conjunctivae/corneas clear. PERRL, EOM's intact. Fundi benign. Extremities: extremities normal, atraumatic, no cyanosis or edema and The left arm wound has a bleeding area from a small vessel.  This was treated with Silver Nitrite stick until hemostasis was obtained. The patient tolerated the procedure well. New dressing were applied w/d and kirlex/ Coban  Lab Results:  Recent Labs    07/14/19 0730 07/14/19 1639 07/15/19 0630  WBC 8.5  --  10.4  HGB 8.5* 7.9* 7.4*  HCT 25.1* 24.0* 22.4*  PLT 267  --  271   BMET Recent Labs    07/14/19 0730 07/15/19 0630  NA 132* 133*  K 5.1 4.8  CL 93* 92*  CO2 29 29  GLUCOSE 124* 101*  BUN 20 32*  CREATININE 6.14* 8.35*  CALCIUM 8.4* 8.0*   PT/INR Recent Labs    07/13/19 1125  LABPROT 17.5*  INR 1.5*   ABG No results for input(s): PHART, HCO3 in the last 72 hours.  Invalid input(s): PCO2, PO2  Studies/Results: No results found.  Anti-infectives: Anti-infectives (From admission, onward)   Start     Dose/Rate Route Frequency Ordered Stop   07/13/19 2200  piperacillin-tazobactam (ZOSYN) IVPB 3.375 g     3.375 g 12.5 mL/hr over 240 Minutes Intravenous Every 12 hours 07/13/19 1601     07/13/19 1215  ceFAZolin (ANCEF) 1-4 GM/50ML-% IVPB    Note to  Pharmacy: Leonia Reader   : cabinet override      07/13/19 1215 07/13/19 1350   07/13/19 1115  ceFAZolin (ANCEF) IVPB 1 g/50 mL premix     1 g 100 mL/hr over 30 Minutes Intravenous On call to O.R. 07/13/19 1114 07/13/19 1350      Assessment/Plan: s/p Procedure(s): LIGATION OF ARTERIOVENOUS  FISTULA (Left) Continue to monitor left arm dressings.  Resume Eliquis at 2200 today  LOS: 2 days    [name] Guise 07/15/2019

## 2019-07-16 ENCOUNTER — Inpatient Hospital Stay
Disposition: A | Discharge status: Home Health | Payer: Self-pay | Source: Other Acute Inpatient Hospital | Attending: Vascular Surgery

## 2019-07-16 ENCOUNTER — Encounter: Payer: Self-pay | Admitting: Vascular Surgery

## 2019-07-16 ENCOUNTER — Encounter
Disposition: A | Discharge status: Home Health | Payer: Self-pay | Source: Other Acute Inpatient Hospital | Attending: Vascular Surgery

## 2019-07-16 DIAGNOSIS — Z992 Dependence on renal dialysis: Secondary | ICD-10-CM

## 2019-07-16 DIAGNOSIS — N186 End stage renal disease: Secondary | ICD-10-CM

## 2019-07-16 HISTORY — PX: DIALYSIS/PERMA CATHETER INSERTION: CATH118288

## 2019-07-16 LAB — CBC
HCT: 20.2 % — ABNORMAL LOW (ref 39.0–52.0)
Hemoglobin: 6.8 g/dL — ABNORMAL LOW (ref 13.0–17.0)
MCH: 36.2 pg — ABNORMAL HIGH (ref 26.0–34.0)
MCHC: 33.7 g/dL (ref 30.0–36.0)
MCV: 107.4 fL — ABNORMAL HIGH (ref 80.0–100.0)
Platelets: 244 10*3/uL (ref 150–400)
RBC: 1.88 MIL/uL — ABNORMAL LOW (ref 4.22–5.81)
RDW: 15.8 % — ABNORMAL HIGH (ref 11.5–15.5)
WBC: 9.5 10*3/uL (ref 4.0–10.5)
nRBC: 0 % (ref 0.0–0.2)

## 2019-07-16 LAB — BASIC METABOLIC PANEL
Anion gap: 11 (ref 5–15)
BUN: 43 mg/dL — ABNORMAL HIGH (ref 6–20)
CO2: 29 mmol/L (ref 22–32)
Calcium: 7.7 mg/dL — ABNORMAL LOW (ref 8.9–10.3)
Chloride: 91 mmol/L — ABNORMAL LOW (ref 98–111)
Creatinine, Ser: 10.47 mg/dL — ABNORMAL HIGH (ref 0.61–1.24)
GFR calc Af Amer: 6 mL/min — ABNORMAL LOW (ref >60)
GFR calc non Af Amer: 5 mL/min — ABNORMAL LOW (ref >60)
Glucose, Bld: 90 mg/dL (ref 70–99)
Potassium: 5.5 mmol/L — ABNORMAL HIGH (ref 3.5–5.1)
Sodium: 131 mmol/L — ABNORMAL LOW (ref 135–145)

## 2019-07-16 LAB — GLUCOSE, CAPILLARY: Glucose-Capillary: 127 mg/dL — ABNORMAL HIGH (ref 70–99)

## 2019-07-16 LAB — PREPARE RBC (CROSSMATCH)

## 2019-07-16 SURGERY — DIALYSIS/PERMA CATHETER INSERTION
Anesthesia: Choice

## 2019-07-16 SURGERY — DIALYSIS/PERMA CATHETER INSERTION
Anesthesia: Moderate Sedation

## 2019-07-16 MED ORDER — DOCUSATE SODIUM 100 MG PO CAPS
100.0000 mg | ORAL_CAPSULE | Freq: Two times a day (BID) | ORAL | Status: DC
  Administered 2019-07-16 – 2019-07-17 (×2): 100 mg via ORAL
  Filled 2019-07-16 (×2): qty 1

## 2019-07-16 MED ORDER — HEPARIN SODIUM (PORCINE) 10000 UNIT/ML IJ SOLN
INTRAMUSCULAR | Status: AC
  Filled 2019-07-16: qty 1

## 2019-07-16 MED ORDER — SODIUM CHLORIDE 0.9% IV SOLUTION: Freq: Once | INTRAVENOUS | Status: DC

## 2019-07-16 MED ORDER — MIDAZOLAM HCL 2 MG/2ML IJ SOLN
INTRAMUSCULAR | Status: DC | PRN
  Administered 2019-07-16: 2 mg via INTRAVENOUS

## 2019-07-16 MED ORDER — FENTANYL CITRATE (PF) 100 MCG/2ML IJ SOLN
INTRAMUSCULAR | Status: DC | PRN
  Administered 2019-07-16: 50 ug via INTRAVENOUS

## 2019-07-16 MED ORDER — FENTANYL CITRATE (PF) 100 MCG/2ML IJ SOLN
INTRAMUSCULAR | Status: AC
  Filled 2019-07-16: qty 2

## 2019-07-16 MED ORDER — MIDAZOLAM HCL 5 MG/5ML IJ SOLN
INTRAMUSCULAR | Status: AC
  Filled 2019-07-16: qty 5

## 2019-07-16 SURGICAL SUPPLY — 10 items
CANNULA 5F STIFF (CANNULA) ×2 IMPLANT
CATH CANNON HEMO 15FR 19 (HEMODIALYSIS SUPPLIES) IMPLANT
CATH PALINDROME RT-P 15FX23CM (CATHETERS) IMPLANT
CATH PALINDROME-SP 14.5FX23 (CATHETERS) ×2 IMPLANT
DERMABOND ADVANCED (GAUZE/BANDAGES/DRESSINGS) ×1
DERMABOND ADVANCED .7 DNX12 (GAUZE/BANDAGES/DRESSINGS) ×1 IMPLANT
GUIDEWIRE SUPER STIFF .035X180 (WIRE) ×2 IMPLANT
PACK ANGIOGRAPHY (CUSTOM PROCEDURE TRAY) ×2 IMPLANT
SUT MNCRL AB 4-0 PS2 18 (SUTURE) ×2 IMPLANT
SUT PROLENE 0 CT 1 30 (SUTURE) ×2 IMPLANT

## 2019-07-16 NOTE — Progress Notes (Signed)
[place], [place] 07/16/19  Subjective:  Right internal jugular PermCath was placed today. Patient tolerated procedure well. Due for dialysis today. Also due for blood transfusion today.   Objective:  Vital signs in last 24 hours:  Temp:  [97.4 F (36.3 C)-98.3 F (36.8 C)] 97.8 F (36.6 C) (11/02 1336) Pulse Rate:  [53-64] 60 (11/02 1336) Resp:  [9-22] 16 (11/02 1336) BP: (118-163)/(61-78) 143/75 (11/02 1336) SpO2:  [93 %-99 %] 93 % (11/02 1336) Weight:  [72.6 kg-72.8 kg] 72.6 kg (11/02 1012)  Weight change:  Filed Weights   07/14/19 0541 07/16/19 0500 07/16/19 1012  Weight: 73.5 kg 72.8 kg 72.6 kg    Intake/Output:    Intake/Output Summary (Last 24 hours) at 07/16/2019 1503 Last data filed at 07/16/2019 0730 Gross per 24 hour  Intake 240 ml  Output -  Net 240 ml    Gen:   No acute distress Eyes/ENT:  Conjunctiva clear,  moist oral mucus membranes Neck:  Supple,  no JVD Lungs:   Clear to auscultation bilaterally, normal effort Heart:   Regular rate and rhythm, 2/6 murmur Abdomen:   Soft, non-tender, bowel sounds active  Extremities: no cyanosis or edema Skin:  turgor normal, no rashes Neurologic: Lethargic but arousable     Basic Metabolic Panel:  Recent Labs  Lab 07/13/19 1125 07/14/19 0730 07/15/19 0630 07/16/19 0551  NA 135 132* 133* 131*  K 3.7 5.1 4.8 5.5*  CL 97* 93* 92* 91*  CO2 28 29 29 29   GLUCOSE 101* 124* 101* 90  BUN 11 20 32* 43*  CREATININE 3.72* 6.14* 8.35* 10.47*  CALCIUM 8.2* 8.4* 8.0* 7.7*  MG  --  2.2  --   --      CBC: Recent Labs  Lab 07/13/19 1125 07/14/19 0730 07/14/19 1639 07/15/19 0630 07/16/19 0551  WBC 5.6 8.5  --  10.4 9.5  NEUTROABS 2.8  --   --   --   --   HGB 9.1* 8.5* 7.9* 7.4* 6.8*  HCT 27.1* 25.1* 24.0* 22.4* 20.2*  MCV 106.3* 105.0*  --  105.7* 107.4*  PLT 250 267  --  271 244      Lab Results  Component Value Date   HEPBSAG Negative 06/04/2019       Microbiology:  Recent Results (from the past 240 hour(s))  SARS Coronavirus 2 by RT PCR (hospital order, performed in [facility] hospital lab) Nasopharyngeal Nasopharyngeal Swab     Status: None   Collection Time: 07/13/19 11:29 AM   Specimen: Nasopharyngeal Swab  Result Value Ref Range Status   SARS Coronavirus 2 NEGATIVE NEGATIVE Final    Comment: (NOTE) If result is NEGATIVE SARS-CoV-2 target nucleic acids are NOT DETECTED. The SARS-CoV-2 RNA is generally detectable in upper and lower  respiratory specimens during the acute phase of infection. The lowest  concentration of SARS-CoV-2 viral copies this assay can detect is 250  copies / mL. A negative result does not preclude SARS-CoV-2 infection  and should not be used as the sole basis for treatment or other  patient management decisions.  A negative result may occur with  improper specimen collection / handling, submission of specimen other  than nasopharyngeal swab, presence of viral mutation(s) within the  areas targeted by this assay, and inadequate number of viral copies  (<250 copies / mL). A negative result must be combined with clinical  observations, patient history, and epidemiological information. If result is POSITIVE SARS-CoV-2 target nucleic acids are  DETECTED. The SARS-CoV-2 RNA is generally detectable in upper and lower  respiratory specimens dur ing the acute phase of infection.  Positive  results are indicative of active infection with SARS-CoV-2.  Clinical  correlation with patient history and other diagnostic information is  necessary to determine patient infection status.  Positive results do  not rule out bacterial infection or co-infection with other viruses. If result is PRESUMPTIVE POSTIVE SARS-CoV-2 nucleic acids MAY BE PRESENT.   A presumptive positive result was obtained on the submitted specimen  and confirmed on repeat testing.  While 2019 novel coronavirus  (SARS-CoV-2) nucleic acids may  be present in the submitted sample  additional confirmatory testing may be necessary for epidemiological  and / or clinical management purposes  to differentiate between  SARS-CoV-2 and other Sarbecovirus currently known to infect humans.  If clinically indicated additional testing with an alternate test  methodology [phone]) is advised. The SARS-CoV-2 RNA is generally  detectable in upper and lower respiratory sp ecimens during the acute  phase of infection. The expected result is Negative. Fact Sheet for Patients:  StrictlyIdeas.no Fact Sheet for Healthcare Providers: BankingDealers.co.za This test is not yet approved or cleared by the [place] FDA and has been authorized for detection and/or diagnosis of SARS-CoV-2 by FDA under an Emergency Use Authorization (EUA).  This EUA will remain in effect (meaning this test can be used) for the duration of the COVID-19 declaration under Section 564(b)(1) of the Act, 21 U.S.C. section 360bbb-3(b)(1), unless the authorization is terminated or revoked sooner. Performed at [facility], [address] [place], [place] [address]   Culture, blood (single) w Reflex to ID Panel     Status: None (Preliminary result)   Collection Time: 07/13/19  4:47 PM   Specimen: BLOOD  Result Value Ref Range Status   Specimen Description BLOOD RAC  Final   Special Requests   Final    BOTTLES DRAWN AEROBIC AND ANAEROBIC Blood Culture results may not be optimal due to an excessive volume of blood received in culture bottles   Culture   Final    NO GROWTH 3 DAYS Performed at [facility], [address] [place],  [address]    Report Status PENDING  Incomplete    Coagulation Studies: No results for input(s): LABPROT, INR in the last 72 hours.  Urinalysis: No results for input(s): COLORURINE, LABSPEC, PHURINE, GLUCOSEU, HGBUR, BILIRUBINUR, KETONESUR, PROTEINUR, UROBILINOGEN,  NITRITE, LEUKOCYTESUR in the last 72 hours.  Invalid input(s): APPERANCEUR    Imaging: No results found.   Medications:   . piperacillin-tazobactam (ZOSYN)  IV 3.375 g (07/16/19 0925)   . sodium chloride   Intravenous Once  . amiodarone  200 mg Oral BID  . amLODipine  10 mg Oral Daily  . apixaban  5 mg Oral BID  . aspirin EC  81 mg Oral Daily  . atorvastatin  20 mg Oral q1800  . budesonide  0.25 mg Nebulization BID  . calcium carbonate  1 tablet Oral TID WC  . carvedilol  25 mg Oral BID WC  . Chlorhexidine Gluconate Cloth  6 each Topical Q0600  . cinacalcet  30 mg Oral Q supper  . cloNIDine  0.2 mg Oral TID  . docusate sodium  100 mg Oral BID  . epoetin (EPOGEN/PROCRIT) injection  4,000 Units Intravenous Q M,W,F-HD  . fentaNYL      . ferric citrate  210 mg Oral TID WC  . folic acid  1 mg Oral Daily  .  furosemide  80 mg Oral BID  . gabapentin  100 mg Oral TID  . heparin      . hydrALAZINE  100 mg Oral Q6H  . irbesartan  300 mg Oral QHS  . loratadine  10 mg Oral Daily  . midazolam      . multivitamin  1 tablet Oral Daily  . multivitamin with minerals  1 tablet Oral Daily  . nicotine  21 mg Transdermal Daily  . sevelamer carbonate  2,400 mg Oral Daily  . thiamine  100 mg Oral Daily   Or  . thiamine  100 mg Intravenous Daily  . tiotropium  18 mcg Inhalation Daily  . zolpidem  10 mg Oral QHS   acetaminophen **OR** acetaminophen, albuterol, HYDROcodone-acetaminophen, LORazepam **OR** LORazepam, magnesium hydroxide, morphine injection, nitroGLYCERIN, ondansetron **OR** ondansetron (ZOFRAN) IV, sodium phosphate, sorbitol  Assessment/ Plan:  58 y.o. male with  ESRD, CAD/ CABG 2015, CHF, sleep apnea, alcohol abuse, current smoker, history of chronic lacunar infarcts within left frontal lobe white matter and left basal ganglia  [facility] MWF-1// TW 69 kg  #End-stage renal disease with complication of dialysis access -Patient has malfunction of  Artegraft.  It was emergently removed on October 30. -PermCath was placed today.  Patient due for dialysis today.  Orders have been prepared.  #Anemia of chronic kidney disease - Lab Results  Component Value Date   HGB 6.8 (L) 07/16/2019   Hemoglobin low at 6.8.  We have ordered 1 unit PRBC transfusion.  #Secondary hyperparathyroidism Lab Results  Component Value Date   CALCIUM 7.7 (L) 07/16/2019   CAION 1.12 11/30/2013   PHOS 4.0 05/31/2019   Phosphorus 4.0 at last check.  Continue periodically monitor.  #Left arm wound  -Wound care as per vascular surgery team.   LOS: 3 Shawonda Kerce 11/2/20203:03 PM  [place], [place]  Note: This note was prepared with Dragon dictation. Any transcription errors are unintentional

## 2019-07-16 NOTE — Op Note (Signed)
OPERATIVE NOTE    PRE-OPERATIVE DIAGNOSIS: 1. ESRD 2.  Recent removal of an infected Artegraft jump graft  POST-OPERATIVE DIAGNOSIS: same as above  PROCEDURE: 1. Ultrasound guidance for vascular access to the right internal jugular vein 2. Right jugular venogram and superior venacavogram 3. Fluoroscopic guidance for placement of catheter 4. Placement of a 23 cm tip to cuff tunneled hemodialysis catheter via the right internal jugular vein  SURGEON: Leotis Pain, MD  ANESTHESIA:  Local with Moderate conscious sedation for approximately 25 minutes using 2 mg of Versed and 50 mcg of Fentanyl  ESTIMATED BLOOD LOSS: 15 cc  FLUORO TIME: 1.3 minutes  CONTRAST: 5 cc  FINDING(S): 1.  Patent right external jugular vein with an occluded internal jugular vein at the level of the clavicle  SPECIMEN(S):  None  INDICATIONS:   Daryl Weeks is a 58 y.o.male who presents with renal failure and a recent removal of his infected Artegraft.  The patient needs long term dialysis access for their ESRD, and a Permcath is necessary.  Risks and benefits are discussed and informed consent is obtained.    DESCRIPTION: After obtaining full informed written consent, the patient was brought back to the vascular suited. The patient's right neck and chest were sterilely prepped and draped in a sterile surgical field was created. Moderate conscious sedation was administered during a face to face encounter with the patient throughout the procedure with my supervision of the RN administering medicines and monitoring the patient's vital signs, pulse oximetry, telemetry and mental status throughout from the start of the procedure until the patient was taken to the recovery room.  The right internal jugular vein was visualized with ultrasound and found to be patent in the mid neck. It was then accessed under direct ultrasound guidance and a permanent image was recorded. A wire was placed but would not go beyond the  clavicle.  I tried a micropuncture wire but that would not pass either.  I then performed a needle venogram and found that the internal jugular vein was occluded at the level of the clavicle.  Collaterals fill a relatively large external jugular vein which was patent down to the superior vena cava which was also widely patent.  I then removed the needle from the internal jugular vein and turned my attention to the external jugular vein.  This was accessed under direct ultrasound guidance with a micropuncture needle and a micropuncture wire was then placed without difficulty.  I then upsized to a micropuncture sheath and placed a 0.035 wire. After skin nick and dilatation, the peel-away sheath was placed over the wire. I then turned my attention to an area under the clavicle. Approximately 1-2 fingerbreadths below the clavicle a small counterincision was created and tunneled from the subclavicular incision to the access site. Using fluoroscopic guidance, a 19 centimeter tip to cuff tunneled hemodialysis catheter was selected, and tunneled from the subclavicular incision to the access site. It was then placed through the peel-away sheath and the peel-away sheath was removed.  However, the catheter tips would not get all the way into the right atrium and there was a kink in the catheter.  I then placed an Amplatz Super Stiff wire through this catheter and remove the 19 cm catheter.  A 23 cm tip to cuff over the wire catheter was then placed over the Amplatz Super Stiff wire with the wire parked in the right atrium.  Using fluoroscopic guidance the catheter tips were parked in the right atrium.  The cuff remained between the access site and the exit site.  The wire was removed.  The appropriate distal connectors were placed. It withdrew blood well and flushed easily with heparinized saline and a concentrated heparin solution was then placed. It was secured to the chest wall with 2 Prolene sutures. The access incision  was closed single 4-0 Monocryl. A 4-0 Monocryl pursestring suture was placed around the exit site. Sterile dressings were placed. The patient tolerated the procedure well and was taken to the recovery room in stable condition.  COMPLICATIONS: None  CONDITION: Stable  Leotis Pain, MD 07/16/2019 11:19 AM   This note was created with Dragon Medical transcription system. Any errors in dictation are purely unintentional.

## 2019-07-16 NOTE — Care Management Important Message (Signed)
Important Message  Patient Details  Name: Daryl Weeks MRN: [id number] Date of Birth: 1961-03-18   Medicare Important Message Given:  Yes     Dannette Barbara 07/16/2019, 11:24 AM

## 2019-07-16 NOTE — Progress Notes (Signed)
   07/16/19 1705  Neurological  Level of Consciousness Alert  Orientation Level Oriented X4  Respiratory  Respiratory Pattern Regular;Unlabored  Cardiac  Pulse Regular  ECG Monitor Yes  PT TO UNIT WITH NO C/OS NO DISTRESS NOTED STABLE FOR HD TX

## 2019-07-16 NOTE — Progress Notes (Signed)
This note also relates to the following rows which could not be included: Pulse Rate - Cannot attach notes to unvalidated device data Resp - Cannot attach notes to unvalidated device data    07/16/19 2007  Vital Signs  Temp 98.7 F (37.1 C)  Temp Source Oral  Pulse Rate Source Monitor  BP 139/77  BP Location Right Arm  BP Method Automatic  Patient Position (if appropriate) Lying  Oxygen Therapy  SpO2 100 %  O2 Device Nasal Cannula  O2 Flow Rate (L/min) 2 L/min  Pulse Oximetry Type Continuous  During Hemodialysis Assessment  Blood Flow Rate (mL/min) 400 mL/min  Arterial Pressure (mmHg) -190 mmHg  Venous Pressure (mmHg) 120 mmHg  Transmembrane Pressure (mmHg) 70 mmHg  Ultrafiltration Rate (mL/min) 500 mL/min  Dialysate Flow Rate (mL/min) 600 ml/min  Conductivity: Machine  13.8  HD Safety Checks Performed Yes  KECN 65.9 KECN  Dialysis Fluid Bolus Normal Saline  Bolus Amount (mL) 250 mL  Intra-Hemodialysis Comments Tx completed;Tolerated well  Post-Hemodialysis Assessment  Rinseback Volume (mL) 250 mL  KECN 65.9 V  Dialyzer Clearance Clear  Duration of HD Treatment -hour(s) 3 hour(s)  Hemodialysis Intake (mL) 500 mL  UF Total -Machine (mL) 1500 mL  Net UF (mL) 1000 mL  Tolerated HD Treatment Yes  pt tolerated HD TX well no c/os no distress noted CVC WDL ufg 1L

## 2019-07-16 NOTE — Progress Notes (Signed)
[facility] Daily Progress Note   07/16/19: 1. Ultrasound guidance for vascular access to the right internal jugular vein 2. Right jugular venogram and superior venacavogram 3. Fluoroscopic guidance for placement of catheter 4. Placement of a 23 cm tip to cuff tunneled hemodialysis catheter via the right internal jugular vein  07/13/19: 1.   Ligation of native portion of AV fistula with excision of the entire Artegraft and the surrounding infected hematoma with negative pressure dressing placement  Subjective: Patient with continued discomfort along wound. Bleeding has slowed significantly.  Objective: Vitals:   07/16/19 1122 07/16/19 1130 07/16/19 1145 07/16/19 1200  BP: 123/72 119/67 118/66 124/66  Pulse: (!) 58  (!) 54 (!) 55  Resp: 13 11 (!) 9 (!) 22  Temp:      TempSrc:      SpO2: 93% 93% 93% 94%  Weight:      Height:        Intake/Output Summary (Last 24 hours) at 07/16/2019 1237 Last data filed at 07/16/2019 0730 Gross per 24 hour  Intake 240 ml  Output -  Net 240 ml   Physical Exam: A&Ox3, NAD CV: RRR Pulmonary: CTA Bilaterally Abdomen: Soft, Nontender, Nondistended Vascular:  Left upper extremity forearm wound -healthy granulation tissue noted to wound.  Minimal bleeding.  Minimal drainage.  No signs of infection.  2+ radial pulse.  Hand is warm.   Laboratory: CBC    Component Value Date/Time   WBC 9.5 07/16/2019 0551   HGB 6.8 (L) 07/16/2019 0551   HGB 13.7 07/11/2014 1046   HCT 20.2 (L) 07/16/2019 0551   HCT 42.2 07/11/2014 1046   PLT 244 07/16/2019 0551   PLT 223 07/11/2014 1046   BMET    Component Value Date/Time   NA 131 (L) 07/16/2019 0551   NA 132 (L) 06/24/2014 0903   K 5.5 (H) 07/16/2019 0551   K 4.8 07/11/2014 1046   CL 91 (L) 07/16/2019 0551   CL 96 (L) 06/24/2014 0903   CO2 29 07/16/2019 0551   CO2 21 06/24/2014 0903   GLUCOSE 90 07/16/2019 0551   GLUCOSE 92 06/24/2014 0903   BUN 43 (H) 07/16/2019 0551   BUN  60 (H) 06/24/2014 0903   CREATININE 10.47 (H) 07/16/2019 0551   CREATININE 9.10 (H) 06/24/2014 0903   CALCIUM 7.7 (L) 07/16/2019 0551   CALCIUM 8.8 06/24/2014 0903   GFRNONAA 5 (L) 07/16/2019 0551   GFRNONAA 7 (L) 06/24/2014 0903   GFRNONAA 11 (L) 01/14/2014 1051   GFRAA 6 (L) 07/16/2019 0551   GFRAA 8 (L) 06/24/2014 0903   GFRAA 12 (L) 01/14/2014 1051   Assessment/Planning: The patient is a 58 year old male with multiple medical issues including end-stage renal disease who presented with infected left upper extremity graft now status post excision of infected graft 1) for PermCath placement today 2) we will plan on discharge home tomorrow with visiting nurse service for wound check  Discussed with Dr. Ellis Parents Stegmayer PA-C 07/16/2019 12:37 PM

## 2019-07-16 NOTE — Progress Notes (Signed)
This note also relates to the following rows which could not be included: Pulse Rate - Cannot attach notes to unvalidated device data ECG Heart Rate - Cannot attach notes to unvalidated device data Resp - Cannot attach notes to unvalidated device data BP - Cannot attach notes to unvalidated device data  VERIFIED WITH PRIMARY RN [facility]

## 2019-07-17 LAB — BASIC METABOLIC PANEL
Anion gap: 13 (ref 5–15)
BUN: 20 mg/dL (ref 6–20)
CO2: 27 mmol/L (ref 22–32)
Calcium: 7.9 mg/dL — ABNORMAL LOW (ref 8.9–10.3)
Chloride: 95 mmol/L — ABNORMAL LOW (ref 98–111)
Creatinine, Ser: 6.43 mg/dL — ABNORMAL HIGH (ref 0.61–1.24)
GFR calc Af Amer: 10 mL/min — ABNORMAL LOW (ref >60)
GFR calc non Af Amer: 9 mL/min — ABNORMAL LOW (ref >60)
Glucose, Bld: 94 mg/dL (ref 70–99)
Potassium: 4.4 mmol/L (ref 3.5–5.1)
Sodium: 135 mmol/L (ref 135–145)

## 2019-07-17 LAB — TYPE AND SCREEN
ABO/RH(D): A POS
Antibody Screen: NEGATIVE
Unit division: 0

## 2019-07-17 LAB — BPAM RBC
Blood Product Expiration Date: 202011072359
ISSUE DATE / TIME: 202011021655
Unit Type and Rh: 600

## 2019-07-17 LAB — CBC
HCT: 22.5 % — ABNORMAL LOW (ref 39.0–52.0)
Hemoglobin: 7.3 g/dL — ABNORMAL LOW (ref 13.0–17.0)
MCH: 33.2 pg (ref 26.0–34.0)
MCHC: 32.4 g/dL (ref 30.0–36.0)
MCV: 102.3 fL — ABNORMAL HIGH (ref 80.0–100.0)
Platelets: 214 10*3/uL (ref 150–400)
RBC: 2.2 MIL/uL — ABNORMAL LOW (ref 4.22–5.81)
RDW: 19.5 % — ABNORMAL HIGH (ref 11.5–15.5)
WBC: 8.5 10*3/uL (ref 4.0–10.5)
nRBC: 0 % (ref 0.0–0.2)

## 2019-07-17 LAB — HEMOGLOBIN AND HEMATOCRIT, BLOOD
HCT: 21.8 % — ABNORMAL LOW (ref 39.0–52.0)
Hemoglobin: 7.3 g/dL — ABNORMAL LOW (ref 13.0–17.0)

## 2019-07-17 LAB — SURGICAL PATHOLOGY

## 2019-07-17 LAB — MAGNESIUM: Magnesium: 2.5 mg/dL — ABNORMAL HIGH (ref 1.7–2.4)

## 2019-07-17 MED ORDER — ASPIRIN 81 MG PO TBEC: 81.0000 mg | DELAYED_RELEASE_TABLET | Freq: Every day | ORAL | 3 refills | Status: DC

## 2019-07-17 MED ORDER — HYDROCODONE-ACETAMINOPHEN 5-325 MG PO TABS: 1.0000 | ORAL_TABLET | Freq: Four times a day (QID) | ORAL | 0 refills | Status: DC | PRN

## 2019-07-17 NOTE — Discharge Summary (Signed)
[facility]    Discharge Summary  Patient ID:  CANAAN SCHWENDIMAN MRN: [id number] DOB/AGE: 23-May-1961 58 y.o.  Admit date: 07/13/2019 Discharge date: 07/17/2019 Date of Surgery: 07/16/2019 Surgeon: Surgeon(s): Dew, Erskine Squibb, MD  Admission Diagnosis: ESRD  Discharge Diagnoses:  ESRD  Secondary Diagnoses: Past Medical History:  Diagnosis Date  . (HFpEF) heart failure with preserved ejection fraction ([place])    a. 05/2019 Echo: EF 50-55%, diast dysfxn, RVSP 56.20mmHg, Sev dil LA. Mildly dil PA.  [name] Anemia   . Anxiety   . Arthritis   . Colon polyps   . COPD (chronic obstructive pulmonary disease) ([place])   . Coronary artery disease    a. 10/2013 NSTEMI/Cath: Severe 3VD-->CABG x 4 @ [facility] 01/2014 (LIMA->LAD, VG->OM1->OM2, VG->RCA); b. 11/2014 MV: No isch/infarct; c. 05/2019 MV: EF 40% (50-55 by echo), small,mild, fixed apical defect - ? atten vs infarct. No ischemia->low risk.  . ESRD (end stage renal disease) ([place])    a. MWFSat HD  . ETOH abuse    a. 2 beers/night.  [name] GERD (gastroesophageal reflux disease)   . History of pneumonia   . Hyperlipidemia   . Hypertension   . IgA nephropathy   . Lower GI bleed    a. Due to colon polyps. Status post resection of 14 polyps  . Non-ST elevation MI (NSTEMI) ([place])   . PAF (paroxysmal atrial fibrillation) ([place])    a. Dx 05/2019. CHA2DS2VASc = 3-->Eliquis/Amio.  [name] Rheumatoid arthritis ([place])   . Shortness of breath   . Sleep apnea   . Tobacco abuse    Procedure(s): 07/16/19: 1. Ultrasound guidance for vascular access to the right internal jugular vein 2. Right jugular venogram and superior venacavogram 3. Fluoroscopic guidance for placement of catheter 4. Placement of a 23 cm tip to cuff tunneled hemodialysis catheter via the right internal jugular vein  07/13/19: 1.   Ligation of native portion of AV fistula with excision of the entire Artegraft and the surrounding infected hematoma with negative pressure dressing  placement  Discharged Condition: Good  HPI / Hospital Course:  The patient is a 58 year old male with multiple medical issues including end-stage renal disease well-known to our service.  Patient has a left upper extremity dialysis access who dialyzes on Monday / Wednesday / Friday.  The patient was recently seen in the [facility] emergency department on 06/30/2019 with a chief complaint of prolonged bleeding from his dialysis access.  The patient was noted to have some bleeding which had stopped in the ED from three needle sites on his access. At that time, ED physician felt no infection was present.  Skin was intact. Patient was discharged home. During dialysis Friday AM, the patient was noted to have an infected left upper extremity access with prolonged bleeding. His nephrologist personally contacted Dr. Lucky Cowboy in regard to this emergent situation. Patient with erythema, tenderness and wound to graft site. A plan to take the patient to the operating room today was made for ligation and excision. On 07/13/19: The patient underwent:  1.   Ligation of native portion of AV fistula with excision of the entire Artegraft and the surrounding infected hematoma with negative pressure dressing placement  The patient tolerated the procedure well was transferred from the recovery room to the surgical floor without issue.  Postop day #1 VAC dressing was removed due to bleeding.  Areas of active bleeding were treated with silver nitrate.  A wet-to-dry dressing was placed.  Patient remained  catheter free over the weekend and on 07/16/19 the patient underwent:  5. Ultrasound guidance for vascular access to the right internal jugular vein 6. Right jugular venogram and superior venacavogram 7. Fluoroscopic guidance for placement of catheter 8. Placement of a 23 cm tip to cuff tunneled hemodialysis catheter via the right internal jugular vein  Again, patient tolerated the procedure and was  transferred from radiology to the surgical floor without issues.  The patient underwent dialysis without issue that evening.  Day of discharge, the patient was tolerating a regular diet, his pain was controlled to the use of p.o. pain medication and he was ambulating at baseline.  Visiting nurse was set up by case management to assist the patient with wound care  Physical exam:  A&Ox3, NAD CV: RRR Pulmonary: CTA Bilaterally Abdomen: Soft, Nontender, Nondistended Vascular:             Left upper extremity forearm wound -healthy granulation tissue noted to wound.  Minimal bleeding.  Minimal drainage.  No signs of infection.  2+ radial pulse.  Hand is warm.  Complications: None  Consults:  Nephrology Case Management  Significant Diagnostic Studies: CBC Lab Results  Component Value Date   WBC 8.5 07/17/2019   HGB 7.3 (L) 07/17/2019   HCT 22.5 (L) 07/17/2019   MCV 102.3 (H) 07/17/2019   PLT 214 07/17/2019   BMET    Component Value Date/Time   NA 135 07/17/2019 0550   NA 132 (L) 06/24/2014 0903   K 4.4 07/17/2019 0550   K 4.8 07/11/2014 1046   CL 95 (L) 07/17/2019 0550   CL 96 (L) 06/24/2014 0903   CO2 27 07/17/2019 0550   CO2 21 06/24/2014 0903   GLUCOSE 94 07/17/2019 0550   GLUCOSE 92 06/24/2014 0903   BUN 20 07/17/2019 0550   BUN 60 (H) 06/24/2014 0903   CREATININE 6.43 (H) 07/17/2019 0550   CREATININE 9.10 (H) 06/24/2014 0903   CALCIUM 7.9 (L) 07/17/2019 0550   CALCIUM 8.8 06/24/2014 0903   GFRNONAA 9 (L) 07/17/2019 0550   GFRNONAA 7 (L) 06/24/2014 0903   GFRNONAA 11 (L) 01/14/2014 1051   GFRAA 10 (L) 07/17/2019 0550   GFRAA 8 (L) 06/24/2014 0903   GFRAA 12 (L) 01/14/2014 1051   COAG Lab Results  Component Value Date   INR 1.5 (H) 07/13/2019   INR 1.1 05/30/2019   INR 1.0 12/14/2018   Disposition:  Discharge to :Home  Allergies as of 07/17/2019      Reactions   Lisinopril       Medication List    STOP taking these medications   Ambien 10 MG  tablet Generic drug: zolpidem   nicotine 21 mg/24hr patch Commonly known as: NICODERM CQ - dosed in mg/24 hours     TAKE these medications   amiodarone 400 MG tablet Commonly known as: PACERONE Take 0.5 tablets (200 mg total) by mouth 2 (two) times daily.   amLODipine 10 MG tablet Commonly known as: NORVASC take 1 tablet by mouth once daily   apixaban 5 MG Tabs tablet Commonly known as: ELIQUIS Take 1 tablet (5 mg total) by mouth 2 (two) times daily.   aspirin 81 MG EC tablet Take 1 tablet (81 mg total) by mouth daily.   atorvastatin 20 MG tablet Commonly known as: LIPITOR take 1 tablet by mouth at bedtime   Auryxia 1 GM 210 MG(Fe) tablet Generic drug: ferric citrate TK 1 T PO  TID BEFORE EACH MEAL   calcium  carbonate 500 MG chewable tablet Commonly known as: TUMS - dosed in mg elemental calcium Chew 1 tablet by mouth as needed.   carvedilol 25 MG tablet Commonly known as: COREG Take 1 tablet (25 mg total) by mouth 2 (two) times daily with a meal.   cloNIDine 0.2 MG tablet Commonly known as: CATAPRES Take 1 tablet (0.2 mg total) by mouth 3 (three) times daily.   docusate sodium 100 MG capsule Commonly known as: COLACE Take 1 capsule (100 mg total) by mouth 2 (two) times daily as needed for mild constipation.   furosemide 80 MG tablet Commonly known as: LASIX Take 80 mg by mouth 2 (two) times daily.   gabapentin 100 MG capsule Commonly known as: NEURONTIN TK 1 C PO TID   hydrALAZINE 100 MG tablet Commonly known as: APRESOLINE Take 1 tablet (100 mg total) by mouth every 6 (six) hours.   HYDROcodone-acetaminophen 5-325 MG tablet Commonly known as: NORCO/VICODIN Take 1-2 tablets by mouth every 6 (six) hours as needed for moderate pain or severe pain.   irbesartan 300 MG tablet Commonly known as: AVAPRO Take 1 tablet (300 mg total) by mouth at bedtime.   irbesartan 300 MG tablet Commonly known as: AVAPRO Take 1 tablet (300 mg total) by mouth at  bedtime.   loratadine 10 MG tablet Commonly known as: CLARITIN Take 1 tablet (10 mg total) by mouth daily.   multivitamin Tabs tablet Take 1 tablet by mouth daily.   nitroGLYCERIN 0.4 MG SL tablet Commonly known as: NITROSTAT Place 1 tablet (0.4 mg total) under the tongue every 5 (five) minutes as needed for chest pain.   ondansetron 4 MG tablet Commonly known as: ZOFRAN TK 1 T PO BID PRF NAUSEA   Pulmicort 0.5 MG/2ML nebulizer solution Generic drug: budesonide Take 2 mLs (0.5 mg total) by nebulization every 12 (twelve) hours.   budesonide 180 MCG/ACT inhaler Commonly known as: Pulmicort Flexhaler Inhale 1 puff into the lungs 2 (two) times daily.   Sensipar 30 MG tablet Generic drug: cinacalcet Take 30 mg by mouth daily with supper.   sevelamer carbonate 800 MG tablet Commonly known as: RENVELA Take 2,400 mg by mouth daily.   tiotropium 18 MCG inhalation capsule Commonly known as: SPIRIVA Place 1 capsule (18 mcg total) into inhaler and inhale daily. What changed: when to take this   Ventolin HFA 108 (90 Base) MCG/ACT inhaler Generic drug: albuterol Inhale 2 puffs into the lungs every 4 (four) hours as needed for wheezing or shortness of breath.      Verbal and written Discharge instructions given to the patient. Wound care per Discharge AVS Follow-up Information    Dew, Erskine Squibb, MD Follow up in 1 week(s).   Specialties: Vascular Surgery, Radiology, Interventional Cardiology Why: To see Arna Medici. First post-op / wound check.  Contact information: [place] [address] [phone]          Signed: Sela Hua, PA-C  07/17/2019, 10:01 AM

## 2019-07-17 NOTE — Discharge Instructions (Signed)
Vascular Surgery Discharge Instructions Dressing Instruction: Wet to dry dressing. Covered with ABD, covered with Kerlix, Covered with ACE or Coban. Changed daily.

## 2019-07-17 NOTE — TOC Transition Note (Signed)
Transition of Care [facility]) - CM/SW Discharge Note   Patient Details  Name: Daryl Weeks MRN: [id number] Date of Birth: Oct 18, 1960  Transition of Care [facility]) CM/SW Contact:  Beverly Sessions, RN Phone Number: 07/17/2019, 3:31 PM   Clinical Narrative:    Notified by Corene Cornea with [facility] they are unable to accept Referral made to [facility] with [facility]  And referral was accepted    Final next level of care: Home w Home Health Services Barriers to Discharge: No Barriers Identified   Patient Goals and CMS Choice Patient states their goals for this hospitalization and ongoing recovery are:: return home CMS Medicare.gov Compare Post Acute Care list provided to:: Patient Choice offered to / list presented to : Patient  Discharge Placement                       Discharge Plan and Services     Post Acute Care Choice: Home Health                    HH Arranged: RN [place]: [place] ([place]) Date [place]: 07/17/19   Representative spoke with at [place]: [place] ([place]) Interventions     Readmission Risk Interventions Readmission Risk Prevention Plan 07/17/2019 06/05/2019 05/31/2019  Transportation Screening Complete Complete Complete  PCP or Specialist Appt within 3-5 Days Complete Complete -  HRI or Home Care Consult Complete - Complete  Social Work Consult for [place] Planning/Counseling Complete - Complete  Palliative Care Screening Not Applicable Not Applicable Not Applicable  Medication Review Press photographer) Complete Complete Complete  Some recent data might be hidden

## 2019-07-17 NOTE — Progress Notes (Signed)
Daryl Weeks to be D/C'd home with sister per MD order.  Discussed prescriptions and follow up appointments with the patient. Prescriptions given to patient, medication list explained in detail. Pt verbalized understanding.  Allergies as of 07/17/2019       Reactions   Lisinopril         Medication List     STOP taking these medications    Ambien 10 MG tablet Generic drug: zolpidem   nicotine 21 mg/24hr patch Commonly known as: NICODERM CQ - dosed in mg/24 hours       TAKE these medications    amiodarone 400 MG tablet Commonly known as: PACERONE Take 0.5 tablets (200 mg total) by mouth 2 (two) times daily.   amLODipine 10 MG tablet Commonly known as: NORVASC take 1 tablet by mouth once daily   apixaban 5 MG Tabs tablet Commonly known as: ELIQUIS Take 1 tablet (5 mg total) by mouth 2 (two) times daily.   aspirin 81 MG EC tablet Take 1 tablet (81 mg total) by mouth daily.   atorvastatin 20 MG tablet Commonly known as: LIPITOR take 1 tablet by mouth at bedtime   Auryxia 1 GM 210 MG(Fe) tablet Generic drug: ferric citrate TK 1 T PO  TID BEFORE EACH MEAL   calcium carbonate 500 MG chewable tablet Commonly known as: TUMS - dosed in mg elemental calcium Chew 1 tablet by mouth as needed.   carvedilol 25 MG tablet Commonly known as: COREG Take 1 tablet (25 mg total) by mouth 2 (two) times daily with a meal.   cloNIDine 0.2 MG tablet Commonly known as: CATAPRES Take 1 tablet (0.2 mg total) by mouth 3 (three) times daily.   docusate sodium 100 MG capsule Commonly known as: COLACE Take 1 capsule (100 mg total) by mouth 2 (two) times daily as needed for mild constipation.   furosemide 80 MG tablet Commonly known as: LASIX Take 80 mg by mouth 2 (two) times daily.   gabapentin 100 MG capsule Commonly known as: NEURONTIN TK 1 C PO TID   hydrALAZINE 100 MG tablet Commonly known as: APRESOLINE Take 1 tablet (100 mg total) by mouth every 6 (six) hours.    HYDROcodone-acetaminophen 5-325 MG tablet Commonly known as: NORCO/VICODIN Take 1-2 tablets by mouth every 6 (six) hours as needed for moderate pain or severe pain.   irbesartan 300 MG tablet Commonly known as: AVAPRO Take 1 tablet (300 mg total) by mouth at bedtime.   irbesartan 300 MG tablet Commonly known as: AVAPRO Take 1 tablet (300 mg total) by mouth at bedtime.   loratadine 10 MG tablet Commonly known as: CLARITIN Take 1 tablet (10 mg total) by mouth daily.   multivitamin Tabs tablet Take 1 tablet by mouth daily.   nitroGLYCERIN 0.4 MG SL tablet Commonly known as: NITROSTAT Place 1 tablet (0.4 mg total) under the tongue every 5 (five) minutes as needed for chest pain.   ondansetron 4 MG tablet Commonly known as: ZOFRAN TK 1 T PO BID PRF NAUSEA   Pulmicort 0.5 MG/2ML nebulizer solution Generic drug: budesonide Take 2 mLs (0.5 mg total) by nebulization every 12 (twelve) hours.   budesonide 180 MCG/ACT inhaler Commonly known as: Pulmicort Flexhaler Inhale 1 puff into the lungs 2 (two) times daily.   Sensipar 30 MG tablet Generic drug: cinacalcet Take 30 mg by mouth daily with supper.   sevelamer carbonate 800 MG tablet Commonly known as: RENVELA Take 2,400 mg by mouth daily.   tiotropium 18  MCG inhalation capsule Commonly known as: SPIRIVA Place 1 capsule (18 mcg total) into inhaler and inhale daily. What changed: when to take this   Ventolin HFA 108 (90 Base) MCG/ACT inhaler Generic drug: albuterol Inhale 2 puffs into the lungs every 4 (four) hours as needed for wheezing or shortness of breath.        Vitals:   07/17/19 1035 07/17/19 1235  BP: (!) 148/80 137/78  Pulse:  76  Resp:    Temp:  98.1 F (36.7 C)  SpO2:  95%    Skin clean, dry and intact without evidence of skin break down, no evidence of skin tears noted. IV catheter discontinued intact. Site without signs and symptoms of complications. Dressing and pressure applied. Pt denies pain  at this time. No complaints noted.  An After Visit Summary was printed and given to the patient. Patient escorted via [place], and D/C home via private auto.  Daryl Weeks A Daryl Weeks

## 2019-07-17 NOTE — Progress Notes (Signed)
Established hemodialysis patient known at [facility] MWF 5:45. Patient normally self transports to treatments. Please contact me with any dialysis placement concerns.  Elvera Bicker Dialysis Coordinator [phone]

## 2019-07-17 NOTE — TOC Initial Note (Signed)
Transition of Care [facility]) - Initial/Assessment Note    Patient Details  Name: Daryl Weeks MRN: [id number] Date of Birth: 03/01/1961  Transition of [facility] CM/SW Contact:    Annamaria Boots, [facility] Phone Number: 07/17/2019, 12:30 PM  Clinical Narrative:  Patient is at high risk for readmission. CSW met with patient to discuss discharge plan. Patient reports that he lives alone but had help from his family. Patient has dialysis at [facility]. Patient drives himself to dialysis and other appointments. Patient states that he has a PCP and has no DME needs at this time. Patient will need home health RN for wound care. Patient is agreeable and chose [facility]. CSW notified Corene Cornea with [facility] of referral. Patient will discharge home today.                  Expected Discharge Plan: [place] Barriers to Discharge: No Barriers Identified   Patient Goals and CMS Choice Patient states their goals for this hospitalization and ongoing recovery are:: return home CMS Medicare.gov Compare Post Acute Care list provided to:: Patient Choice offered to / list presented to : Patient  Expected Discharge Plan and Services Expected Discharge Plan: [place] Choice: [place] arrangements for the past 2 months: Single Family Home Expected Discharge Date: 07/17/19                         HH Arranged: RN [place] Agency: [place] ([place]) Date [place]: 07/17/19   Representative spoke with at [place]: Corene Cornea  Prior Living Arrangements/Services Living arrangements for the past 2 months: [place] Lives with:: Self Patient language and need for interpreter reviewed:: Yes Do you feel safe going back to the place where you live?: Yes      Need for Family Participation in Patient Care: No (Comment) Care giver support system in place?: No (comment)   Criminal Activity/Legal Involvement Pertinent to  Current Situation/Hospitalization: No - Comment as needed  Activities of Daily Living Home Assistive Devices/Equipment: None ADL Screening (condition at time of admission) Patient's cognitive ability adequate to safely complete daily activities?: Yes Is the patient deaf or have difficulty hearing?: No Does the patient have difficulty seeing, even when wearing glasses/contacts?: Yes Does the patient have difficulty concentrating, remembering, or making decisions?: No Patient able to express need for assistance with ADLs?: Yes Does the patient have difficulty dressing or bathing?: No Independently performs ADLs?: Yes (appropriate for developmental age) Does the patient have difficulty walking or climbing stairs?: No Weakness of Legs: None Weakness of Arms/Hands: None  Permission Sought/Granted Permission sought to share information with : Case Manager, Customer service manager, Family Supports Permission granted to share information with : Yes, Verbal Permission Granted              Emotional Assessment Appearance:: Appears stated age   Affect (typically observed): Accepting, Hopeful Orientation: : Oriented to Self, Oriented to Place, Oriented to  Time, Oriented to Situation Alcohol / Substance Use: Not Applicable Psych Involvement: No (comment)  Admission diagnosis:  ESRD Patient Active Problem List   Diagnosis Date Noted  . Complication of AV dialysis fistula 07/13/2019  . HCAP (healthcare-associated pneumonia) 05/28/2019  . Complication of vascular access for dialysis 11/28/2018  . COPD (chronic obstructive pulmonary disease) ([place]) 12/05/2017  . ESRD on dialysis ([place]) 10/05/2016  . IgA nephropathy 11/05/2014  .  S/P CABG x 4 11/29/2013  . Chronic diastolic heart failure ([place]) 11/16/2013  . Coronary artery disease   . Hyperlipidemia   . Essential hypertension   . Dyspnea 11/20/2012  . Tobacco abuse    PCP:  Mikey College, NP Pharmacy:   [place], [place] [address] Phone: [phone] Fax: [place], [place] [address] Phone: [phone] Fax: [phone]     Social Determinants of Health (SDOH) Interventions    Readmission Risk Interventions Readmission Risk Prevention Plan 07/17/2019 06/05/2019 05/31/2019  Transportation Screening Complete Complete Complete  PCP or Specialist Appt within 3-5 Days Complete Complete -  HRI or Home Care Consult Complete - Complete  Social Work Consult for [place] Planning/Counseling Complete - Complete  Palliative Care Screening Not Applicable Not Applicable Not Applicable  Medication Review (RN Care Manager) Complete Complete Complete  Some recent data might be hidden

## 2019-07-18 DIAGNOSIS — R1110 Vomiting, unspecified: Secondary | ICD-10-CM | POA: Diagnosis not present

## 2019-07-18 DIAGNOSIS — Z992 Dependence on renal dialysis: Secondary | ICD-10-CM | POA: Diagnosis not present

## 2019-07-18 DIAGNOSIS — N2581 Secondary hyperparathyroidism of renal origin: Secondary | ICD-10-CM | POA: Diagnosis not present

## 2019-07-18 DIAGNOSIS — N186 End stage renal disease: Secondary | ICD-10-CM | POA: Diagnosis not present

## 2019-07-18 DIAGNOSIS — D509 Iron deficiency anemia, unspecified: Secondary | ICD-10-CM | POA: Diagnosis not present

## 2019-07-18 DIAGNOSIS — R110 Nausea: Secondary | ICD-10-CM | POA: Diagnosis not present

## 2019-07-18 LAB — CULTURE, BLOOD (SINGLE): Culture: NO GROWTH

## 2019-07-20 DIAGNOSIS — Z951 Presence of aortocoronary bypass graft: Secondary | ICD-10-CM | POA: Diagnosis not present

## 2019-07-20 DIAGNOSIS — I2510 Atherosclerotic heart disease of native coronary artery without angina pectoris: Secondary | ICD-10-CM | POA: Diagnosis not present

## 2019-07-20 DIAGNOSIS — M1990 Unspecified osteoarthritis, unspecified site: Secondary | ICD-10-CM | POA: Diagnosis not present

## 2019-07-20 DIAGNOSIS — E785 Hyperlipidemia, unspecified: Secondary | ICD-10-CM | POA: Diagnosis not present

## 2019-07-20 DIAGNOSIS — J449 Chronic obstructive pulmonary disease, unspecified: Secondary | ICD-10-CM | POA: Diagnosis not present

## 2019-07-20 DIAGNOSIS — I252 Old myocardial infarction: Secondary | ICD-10-CM | POA: Diagnosis not present

## 2019-07-20 DIAGNOSIS — R1110 Vomiting, unspecified: Secondary | ICD-10-CM | POA: Diagnosis not present

## 2019-07-20 DIAGNOSIS — Z7982 Long term (current) use of aspirin: Secondary | ICD-10-CM | POA: Diagnosis not present

## 2019-07-20 DIAGNOSIS — K219 Gastro-esophageal reflux disease without esophagitis: Secondary | ICD-10-CM | POA: Diagnosis not present

## 2019-07-20 DIAGNOSIS — N028 Recurrent and persistent hematuria with other morphologic changes: Secondary | ICD-10-CM | POA: Diagnosis not present

## 2019-07-20 DIAGNOSIS — L539 Erythematous condition, unspecified: Secondary | ICD-10-CM | POA: Diagnosis not present

## 2019-07-20 DIAGNOSIS — I1311 Hypertensive heart and chronic kidney disease without heart failure, with stage 5 chronic kidney disease, or end stage renal disease: Secondary | ICD-10-CM | POA: Diagnosis not present

## 2019-07-20 DIAGNOSIS — D509 Iron deficiency anemia, unspecified: Secondary | ICD-10-CM | POA: Diagnosis not present

## 2019-07-20 DIAGNOSIS — G4730 Sleep apnea, unspecified: Secondary | ICD-10-CM | POA: Diagnosis not present

## 2019-07-20 DIAGNOSIS — T82838D Hemorrhage of vascular prosthetic devices, implants and grafts, subsequent encounter: Secondary | ICD-10-CM | POA: Diagnosis not present

## 2019-07-20 DIAGNOSIS — F419 Anxiety disorder, unspecified: Secondary | ICD-10-CM | POA: Diagnosis not present

## 2019-07-20 DIAGNOSIS — I480 Paroxysmal atrial fibrillation: Secondary | ICD-10-CM | POA: Diagnosis not present

## 2019-07-20 DIAGNOSIS — Z79891 Long term (current) use of opiate analgesic: Secondary | ICD-10-CM | POA: Diagnosis not present

## 2019-07-20 DIAGNOSIS — Z7951 Long term (current) use of inhaled steroids: Secondary | ICD-10-CM | POA: Diagnosis not present

## 2019-07-20 DIAGNOSIS — T827XXD Infection and inflammatory reaction due to other cardiac and vascular devices, implants and grafts, subsequent encounter: Secondary | ICD-10-CM | POA: Diagnosis not present

## 2019-07-20 DIAGNOSIS — N186 End stage renal disease: Secondary | ICD-10-CM | POA: Diagnosis not present

## 2019-07-20 DIAGNOSIS — Z7901 Long term (current) use of anticoagulants: Secondary | ICD-10-CM | POA: Diagnosis not present

## 2019-07-20 DIAGNOSIS — I5030 Unspecified diastolic (congestive) heart failure: Secondary | ICD-10-CM | POA: Diagnosis not present

## 2019-07-20 DIAGNOSIS — R110 Nausea: Secondary | ICD-10-CM | POA: Diagnosis not present

## 2019-07-20 DIAGNOSIS — N2581 Secondary hyperparathyroidism of renal origin: Secondary | ICD-10-CM | POA: Diagnosis not present

## 2019-07-20 DIAGNOSIS — D631 Anemia in chronic kidney disease: Secondary | ICD-10-CM | POA: Diagnosis not present

## 2019-07-20 DIAGNOSIS — F17200 Nicotine dependence, unspecified, uncomplicated: Secondary | ICD-10-CM | POA: Diagnosis not present

## 2019-07-20 DIAGNOSIS — Z992 Dependence on renal dialysis: Secondary | ICD-10-CM | POA: Diagnosis not present

## 2019-07-20 DIAGNOSIS — M069 Rheumatoid arthritis, unspecified: Secondary | ICD-10-CM | POA: Diagnosis not present

## 2019-07-23 DIAGNOSIS — I2510 Atherosclerotic heart disease of native coronary artery without angina pectoris: Secondary | ICD-10-CM | POA: Diagnosis not present

## 2019-07-23 DIAGNOSIS — I252 Old myocardial infarction: Secondary | ICD-10-CM | POA: Diagnosis not present

## 2019-07-23 DIAGNOSIS — I1311 Hypertensive heart and chronic kidney disease without heart failure, with stage 5 chronic kidney disease, or end stage renal disease: Secondary | ICD-10-CM | POA: Diagnosis not present

## 2019-07-23 DIAGNOSIS — R1110 Vomiting, unspecified: Secondary | ICD-10-CM | POA: Diagnosis not present

## 2019-07-23 DIAGNOSIS — R110 Nausea: Secondary | ICD-10-CM | POA: Diagnosis not present

## 2019-07-23 DIAGNOSIS — T82838D Hemorrhage of vascular prosthetic devices, implants and grafts, subsequent encounter: Secondary | ICD-10-CM | POA: Diagnosis not present

## 2019-07-23 DIAGNOSIS — Z992 Dependence on renal dialysis: Secondary | ICD-10-CM | POA: Diagnosis not present

## 2019-07-23 DIAGNOSIS — D509 Iron deficiency anemia, unspecified: Secondary | ICD-10-CM | POA: Diagnosis not present

## 2019-07-23 DIAGNOSIS — I5030 Unspecified diastolic (congestive) heart failure: Secondary | ICD-10-CM | POA: Diagnosis not present

## 2019-07-23 DIAGNOSIS — N186 End stage renal disease: Secondary | ICD-10-CM | POA: Diagnosis not present

## 2019-07-23 DIAGNOSIS — N2581 Secondary hyperparathyroidism of renal origin: Secondary | ICD-10-CM | POA: Diagnosis not present

## 2019-07-23 DIAGNOSIS — T827XXD Infection and inflammatory reaction due to other cardiac and vascular devices, implants and grafts, subsequent encounter: Secondary | ICD-10-CM | POA: Diagnosis not present

## 2019-07-24 ENCOUNTER — Other Ambulatory Visit: Payer: Self-pay

## 2019-07-24 ENCOUNTER — Ambulatory Visit (INDEPENDENT_AMBULATORY_CARE_PROVIDER_SITE_OTHER): Payer: Medicare Other | Admitting: Nurse Practitioner

## 2019-07-24 ENCOUNTER — Encounter (INDEPENDENT_AMBULATORY_CARE_PROVIDER_SITE_OTHER): Payer: Self-pay | Admitting: Nurse Practitioner

## 2019-07-24 VITALS — BP 145/67 | HR 69 | Resp 17 | Ht 67.0 in | Wt 160.0 lb

## 2019-07-24 DIAGNOSIS — J431 Panlobular emphysema: Secondary | ICD-10-CM

## 2019-07-24 DIAGNOSIS — E785 Hyperlipidemia, unspecified: Secondary | ICD-10-CM | POA: Diagnosis not present

## 2019-07-24 DIAGNOSIS — T829XXD Unspecified complication of cardiac and vascular prosthetic device, implant and graft, subsequent encounter: Secondary | ICD-10-CM

## 2019-07-24 NOTE — Progress Notes (Signed)
SUBJECTIVE:  Patient ID: Daryl Weeks, male    DOB: August 15, 1961, 58 y.o.   MRN: [id number] Chief Complaint  Patient presents with   Follow-up    HPI  JAQUESE Weeks is a 58 y.o. male that presents today after jump graft excision due to infection on 07/16/2019.  The patient states that the wound was painful initially however now that he is able to have frequent dressing changes is much better.  He denies any fever, chills, nausea, vomiting or diarrhea since discharge.  Currently he is maintained via right PermCath.  He denies any issues with it at this time.  Past Medical History:  Diagnosis Date   (HFpEF) heart failure with preserved ejection fraction ([place])    a. 05/2019 Echo: EF 50-55%, diast dysfxn, RVSP 56.63mmHg, Sev dil LA. Mildly dil PA.   Anemia    Anxiety    Arthritis    Colon polyps    COPD (chronic obstructive pulmonary disease) (HCC)    Coronary artery disease    a. 10/2013 NSTEMI/Cath: Severe 3VD-->CABG x 4 @ [facility] 01/2014 (LIMA->LAD, VG->OM1->OM2, VG->RCA); b. 11/2014 MV: No isch/infarct; c. 05/2019 MV: EF 40% (50-55 by echo), small,mild, fixed apical defect - ? atten vs infarct. No ischemia->low risk.   ESRD (end stage renal disease) ([place])    a. MWFSat HD   ETOH abuse    a. 2 beers/night.   GERD (gastroesophageal reflux disease)    History of pneumonia    Hyperlipidemia    Hypertension    IgA nephropathy    Lower GI bleed    a. Due to colon polyps. Status post resection of 14 polyps   Non-ST elevation MI (NSTEMI) (HCC)    PAF (paroxysmal atrial fibrillation) ([place])    a. Dx 05/2019. CHA2DS2VASc = 3-->Eliquis/Amio.   Rheumatoid arthritis (HCC)    Shortness of breath    Sleep apnea    Tobacco abuse     Past Surgical History:  Procedure Laterality Date   A/V FISTULAGRAM Left 10/28/2016   Procedure: A/V Fistulagram;  Surgeon: [name], MD;  Location: [facility];  Service: Cardiovascular;  Laterality: Left;   A/V FISTULAGRAM Left  12/07/2018   Procedure: A/V FISTULAGRAM;  Surgeon: [name], MD;  Location: [facility];  Service: Cardiovascular;  Laterality: Left;   A/V FISTULAGRAM N/A 06/04/2019   Procedure: A/V Fistulagram- LEFT;  Surgeon: [name], MD;  Location: [facility];  Service: Cardiovascular;  Laterality: N/A;   A/V SHUNT INTERVENTION N/A 10/28/2016   Procedure: A/V Shunt Intervention;  Surgeon: [name], MD;  Location: [facility];  Service: Cardiovascular;  Laterality: N/A;   A/V SHUNTOGRAM Left 02/01/2019   Procedure: A/V SHUNTOGRAM;  Surgeon: [name], MD;  Location: [facility];  Service: Cardiovascular;  Laterality: Left;   AV FISTULA PLACEMENT     CARDIAC CATHETERIZATION     RCA 90% and calcified mid LAD 80% Stenosis   CORONARY ARTERY BYPASS GRAFT N/A 11/29/2013   Procedure: CORONARY ARTERY BYPASS GRAFTING (CABG) x 4 using endoscopically harvested right saphenous vein and left internal mammary artery;  Surgeon: [name], MD;  Location: [facility];  Service: Open Heart Surgery;  Laterality: N/A;   dialysis catheterr  2/15   DIALYSIS/PERMA CATHETER INSERTION N/A 07/16/2019   Procedure: DIALYSIS/PERMA CATHETER INSERTION WITH VAC CHANGE UNDER SEDATION;  Surgeon: [name], MD;  Location: [facility];  Service: Cardiovascular;  Laterality:  N/A;   DIALYSIS/PERMA CATHETER REMOVAL N/A 02/15/2019   Procedure: DIALYSIS/PERMA CATHETER REMOVAL;  Surgeon: [name], MD;  Location: [facility];  Service: Cardiovascular;  Laterality: N/A;   INSERTION OF DIALYSIS CATHETER N/A 12/14/2018   Procedure: INSERTION OF DIALYSIS CATHETER ( PERMCATH );  Surgeon: [name], MD;  Location: [facility];  Service: Vascular;  Laterality: N/A;   INTRAOPERATIVE TRANSESOPHAGEAL ECHOCARDIOGRAM N/A 11/29/2013   Procedure: INTRAOPERATIVE TRANSESOPHAGEAL ECHOCARDIOGRAM;  Surgeon: [name], MD;  Location: [facility];  Service: Open Heart Surgery;  Laterality: N/A;     LIGATION OF ARTERIOVENOUS  FISTULA Left 07/13/2019   Procedure: LIGATION OF ARTERIOVENOUS  FISTULA;  Surgeon: [name], MD;  Location: [facility];  Service: Vascular;  Laterality: Left;   PERIPHERAL VASCULAR CATHETERIZATION N/A 06/12/2015   Procedure: A/V Shuntogram/Fistulagram;  Surgeon: [name], MD;  Location: [facility];  Service: Cardiovascular;  Laterality: N/A;   PERIPHERAL VASCULAR CATHETERIZATION Left 06/12/2015   Procedure: A/V Shunt Intervention;  Surgeon: [name], MD;  Location: [facility];  Service: Cardiovascular;  Laterality: Left;   RENAL BIOPSY Left 14   REVISON OF ARTERIOVENOUS FISTULA Left 12/14/2018   Procedure: REVISON OF ARTERIOVENOUS FISTULA;  Surgeon: [name], MD;  Location: [facility];  Service: Vascular;  Laterality: Left;    Social History   Socioeconomic History   Marital status: Single    Spouse name: Not on file   Number of children: Not on file   Years of education: Not on file   Highest education level: 8th grade  Occupational History   Not on file  Social Needs   Financial resource strain: Not hard at all   Food insecurity    Worry: Never true    Inability: Never true   Transportation needs    Medical: No    Non-medical: No  Tobacco Use   Smoking status: Current Every Day Smoker    Packs/day: 0.50    Years: 32.00    Pack years: 16.00    Types: Cigarettes   Smokeless tobacco: Never Used   Tobacco comment: daily  Substance and Sexual Activity   Alcohol use: Yes    Alcohol/week: 14.0 standard drinks    Types: 14 Cans of beer per week    Comment: 2 beers/night   Drug use: No   Sexual activity: Not on file  Lifestyle   Physical activity    Days per week: 0 days    Minutes per session: 0 min   Stress: Not at all  Relationships   Social connections    Talks on phone: More than three times a week    Gets together: More than three times a week    Attends religious service: Never     Active member of club or organization: No    Attends meetings of clubs or organizations: Never    Relationship status: Divorced   Intimate partner violence    Fear of current or ex partner: No    Emotionally abused: No    Physically abused: No    Forced sexual activity: No  Other Topics Concern   Not on file  Social History Narrative   Lives locally by himself.  Does not routinely exercise.    Family History  Problem Relation Age of Onset   Heart disease Father    Heart disease Brother    Healthy Sister    Stroke Neg Hx     Allergies  Allergen Reactions   Lisinopril      Review of Systems    Review of Systems: Negative Unless Checked Constitutional: [] Weight loss  [] Fever  [] Chills Cardiac: [] Chest pain   []  Atrial Fibrillation  [] Palpitations   [] Shortness of breath when laying flat   [] Shortness of breath with exertion. [] Shortness of breath at rest Vascular:  [] Pain in legs with walking   [] Pain in legs with standing [] Pain in legs when laying flat   [] Claudication    [] Pain in feet when laying flat    [] History of DVT   [] Phlebitis   [] Swelling in legs   [] Varicose veins   [] Non-healing ulcers Pulmonary:   [] Uses home oxygen   [] Productive cough   [] Hemoptysis   [] Wheeze  [x] COPD   [] Asthma Neurologic:  [] Dizziness   [] Seizures  [] Blackouts [] History of stroke   [] History of TIA  [] Aphasia   [] Temporary Blindness   [] Weakness or numbness in arm   [] Weakness or numbness in leg Musculoskeletal:   [] Joint swelling   [] Joint pain   [] Low back pain  []  History of Knee Replacement [] Arthritis [] back Surgeries  []  Spinal Stenosis    Hematologic:  [] Easy bruising  [] Easy bleeding   [] Hypercoagulable state   [x] Anemic Gastrointestinal:  [] Diarrhea   [] Vomiting  [] Gastroesophageal reflux/heartburn   [] Difficulty swallowing. [] Abdominal pain Genitourinary:  [x] Chronic kidney disease   [] Difficult urination  [] Anuric   [] Blood in urine [] Frequent urination  [] Burning with  urination   [] Hematuria Skin:  [] Rashes   [] Ulcers [] Wounds Psychological:  [x] History of anxiety   []  History of major depression  []  Memory Difficulties      OBJECTIVE:   Physical Exam  BP (!) 145/67 (BP Location: Right Arm)    Pulse 69    Resp 17    Ht 5\' 7"  (1.702 m)    Wt 160 lb (72.6 kg)    BMI 25.06 kg/m   Gen: WD/WN, NAD,  Head: [place]/AT, No temporalis wasting.  Ear/Nose/Throat: Hearing grossly intact, nares w/o erythema or drainage Eyes: PER, EOMI, sclera nonicteric.  Neck: Supple, no masses.  No JVD.  Pulmonary:  Good air movement, no use of accessory muscles.  Cardiac: RRR Vascular:  No signs symptoms of infection, large wound on left forearm from graft excision Vessel Right Left  Radial Palpable Palpable   Gastrointestinal: soft, non-distended. No guarding/no peritoneal signs.  Musculoskeletal: M/S 5/5 throughout.  No deformity or atrophy.  Neurologic: Pain and light touch intact in extremities.  Symmetrical.  Speech is fluent. Motor exam as listed above. Psychiatric: Judgment intact, Mood & affect appropriate for pt's clinical situation. Dermatologic: No Venous rashes. No Ulcers Noted.  No changes consistent with cellulitis. Lymph : No Cervical lymphadenopathy, no lichenification or skin changes of chronic lymphedema.       ASSESSMENT AND PLAN:  1. Complication of arteriovenous dialysis fistula, subsequent encounter The patient will ultimately need bilateral upper extremity vein mapping before we can place another fistula, however he has a very large open wound from excision of his jump graft.   Due to this we will delay any vein mapping until this wound is healed. This is largely because placing any graft or fistula prior to his open wound being healed we will increase the likelihood that the new fistula/graft will be infected.  We will also reach out to his home health company for wound care instructions.  We will have the patient back in 6 weeks to reevaluate his  wound.  Sutures removed from  arm today, patient tolerated well  2. Hyperlipidemia, unspecified hyperlipidemia type Continue statin as ordered and reviewed, no changes at this time   3. Panlobular emphysema ([place]) Continue pulmonary medications and aerosols as already ordered, these medications have been reviewed and there are no changes at this time.     Current Outpatient Medications on File Prior to Visit  Medication Sig Dispense Refill   albuterol (VENTOLIN HFA) 108 (90 Base) MCG/ACT inhaler Inhale 2 puffs into the lungs every 4 (four) hours as needed for wheezing or shortness of breath.      amiodarone (PACERONE) 400 MG tablet Take 0.5 tablets (200 mg total) by mouth 2 (two) times daily. 60 tablet 0   amLODipine (NORVASC) 10 MG tablet take 1 tablet by mouth once daily 30 tablet 5   apixaban (ELIQUIS) 5 MG TABS tablet Take 1 tablet (5 mg total) by mouth 2 (two) times daily. 60 tablet 0   aspirin EC 81 MG EC tablet Take 1 tablet (81 mg total) by mouth daily. 90 tablet 3   atorvastatin (LIPITOR) 20 MG tablet take 1 tablet by mouth at bedtime 30 tablet 6   AURYXIA 1 GM 210 MG(Fe) tablet TK 1 T PO  TID BEFORE EACH MEAL  11   budesonide (PULMICORT FLEXHALER) 180 MCG/ACT inhaler Inhale 1 puff into the lungs 2 (two) times daily. 1 each 11   budesonide (PULMICORT) 0.5 MG/2ML nebulizer solution Take 2 mLs (0.5 mg total) by nebulization every 12 (twelve) hours. 60 mL 11   calcium carbonate (TUMS - DOSED IN MG ELEMENTAL CALCIUM) 500 MG chewable tablet Chew 1 tablet by mouth as needed.      carvedilol (COREG) 25 MG tablet Take 1 tablet (25 mg total) by mouth 2 (two) times daily with a meal. 60 tablet 0   cinacalcet (SENSIPAR) 30 MG tablet Take 30 mg by mouth daily with supper.      cloNIDine (CATAPRES) 0.2 MG tablet Take 1 tablet (0.2 mg total) by mouth 3 (three) times daily. 90 tablet 0   docusate sodium (COLACE) 100 MG capsule Take 1 capsule (100 mg total) by mouth 2 (two) times  daily as needed for mild constipation. 10 capsule 0   furosemide (LASIX) 80 MG tablet Take 80 mg by mouth 2 (two) times daily.      gabapentin (NEURONTIN) 100 MG capsule TK 1 C PO TID  11   hydrALAZINE (APRESOLINE) 100 MG tablet Take 1 tablet (100 mg total) by mouth every 6 (six) hours. 180 tablet 0   HYDROcodone-acetaminophen (NORCO/VICODIN) 5-325 MG tablet Take 1-2 tablets by mouth every 6 (six) hours as needed for moderate pain or severe pain. 50 tablet 0   irbesartan (AVAPRO) 300 MG tablet Take 1 tablet (300 mg total) by mouth at bedtime. 60 tablet 0   irbesartan (AVAPRO) 300 MG tablet Take 1 tablet (300 mg total) by mouth at bedtime. 30 tablet 0   loratadine (CLARITIN) 10 MG tablet Take 1 tablet (10 mg total) by mouth daily. 30 tablet 0   multivitamin (RENA-VIT) TABS tablet Take 1 tablet by mouth daily.   11   nitroGLYCERIN (NITROSTAT) 0.4 MG SL tablet Place 1 tablet (0.4 mg total) under the tongue every 5 (five) minutes as needed for chest pain. 10 tablet 0   ondansetron (ZOFRAN) 4 MG tablet TK 1 T PO BID PRF NAUSEA  1   sevelamer carbonate (RENVELA) 800 MG tablet Take 2,400 mg by mouth daily.      tiotropium (SPIRIVA)  18 MCG inhalation capsule Place 1 capsule (18 mcg total) into inhaler and inhale daily. (Patient taking differently: Place 18 mcg into inhaler and inhale every evening. ) 30 capsule 12   zolpidem (AMBIEN) 10 MG tablet Take 10 mg by mouth at bedtime.     No current facility-administered medications on file prior to visit.     There are no Patient Instructions on file for this visit. No follow-ups on file.   Kris Hartmann, NP  This note was completed with Sales executive.  Any errors are purely unintentional.

## 2019-07-25 DIAGNOSIS — N2581 Secondary hyperparathyroidism of renal origin: Secondary | ICD-10-CM | POA: Diagnosis not present

## 2019-07-25 DIAGNOSIS — N186 End stage renal disease: Secondary | ICD-10-CM | POA: Diagnosis not present

## 2019-07-25 DIAGNOSIS — R110 Nausea: Secondary | ICD-10-CM | POA: Diagnosis not present

## 2019-07-25 DIAGNOSIS — D509 Iron deficiency anemia, unspecified: Secondary | ICD-10-CM | POA: Diagnosis not present

## 2019-07-25 DIAGNOSIS — Z992 Dependence on renal dialysis: Secondary | ICD-10-CM | POA: Diagnosis not present

## 2019-07-25 DIAGNOSIS — R1110 Vomiting, unspecified: Secondary | ICD-10-CM | POA: Diagnosis not present

## 2019-07-26 DIAGNOSIS — I1311 Hypertensive heart and chronic kidney disease without heart failure, with stage 5 chronic kidney disease, or end stage renal disease: Secondary | ICD-10-CM | POA: Diagnosis not present

## 2019-07-26 DIAGNOSIS — I5030 Unspecified diastolic (congestive) heart failure: Secondary | ICD-10-CM | POA: Diagnosis not present

## 2019-07-26 DIAGNOSIS — I2510 Atherosclerotic heart disease of native coronary artery without angina pectoris: Secondary | ICD-10-CM | POA: Diagnosis not present

## 2019-07-26 DIAGNOSIS — I252 Old myocardial infarction: Secondary | ICD-10-CM | POA: Diagnosis not present

## 2019-07-26 DIAGNOSIS — T827XXD Infection and inflammatory reaction due to other cardiac and vascular devices, implants and grafts, subsequent encounter: Secondary | ICD-10-CM | POA: Diagnosis not present

## 2019-07-26 DIAGNOSIS — T82838D Hemorrhage of vascular prosthetic devices, implants and grafts, subsequent encounter: Secondary | ICD-10-CM | POA: Diagnosis not present

## 2019-07-27 DIAGNOSIS — Z992 Dependence on renal dialysis: Secondary | ICD-10-CM | POA: Diagnosis not present

## 2019-07-27 DIAGNOSIS — D509 Iron deficiency anemia, unspecified: Secondary | ICD-10-CM | POA: Diagnosis not present

## 2019-07-27 DIAGNOSIS — N2581 Secondary hyperparathyroidism of renal origin: Secondary | ICD-10-CM | POA: Diagnosis not present

## 2019-07-27 DIAGNOSIS — N186 End stage renal disease: Secondary | ICD-10-CM | POA: Diagnosis not present

## 2019-07-27 DIAGNOSIS — R110 Nausea: Secondary | ICD-10-CM | POA: Diagnosis not present

## 2019-07-27 DIAGNOSIS — R1110 Vomiting, unspecified: Secondary | ICD-10-CM | POA: Diagnosis not present

## 2019-07-30 DIAGNOSIS — N2581 Secondary hyperparathyroidism of renal origin: Secondary | ICD-10-CM | POA: Diagnosis not present

## 2019-07-30 DIAGNOSIS — Z992 Dependence on renal dialysis: Secondary | ICD-10-CM | POA: Diagnosis not present

## 2019-07-30 DIAGNOSIS — I1311 Hypertensive heart and chronic kidney disease without heart failure, with stage 5 chronic kidney disease, or end stage renal disease: Secondary | ICD-10-CM | POA: Diagnosis not present

## 2019-07-30 DIAGNOSIS — T82838D Hemorrhage of vascular prosthetic devices, implants and grafts, subsequent encounter: Secondary | ICD-10-CM | POA: Diagnosis not present

## 2019-07-30 DIAGNOSIS — R1110 Vomiting, unspecified: Secondary | ICD-10-CM | POA: Diagnosis not present

## 2019-07-30 DIAGNOSIS — I2510 Atherosclerotic heart disease of native coronary artery without angina pectoris: Secondary | ICD-10-CM | POA: Diagnosis not present

## 2019-07-30 DIAGNOSIS — T827XXD Infection and inflammatory reaction due to other cardiac and vascular devices, implants and grafts, subsequent encounter: Secondary | ICD-10-CM | POA: Diagnosis not present

## 2019-07-30 DIAGNOSIS — N186 End stage renal disease: Secondary | ICD-10-CM | POA: Diagnosis not present

## 2019-07-30 DIAGNOSIS — I5030 Unspecified diastolic (congestive) heart failure: Secondary | ICD-10-CM | POA: Diagnosis not present

## 2019-07-30 DIAGNOSIS — D509 Iron deficiency anemia, unspecified: Secondary | ICD-10-CM | POA: Diagnosis not present

## 2019-07-30 DIAGNOSIS — R110 Nausea: Secondary | ICD-10-CM | POA: Diagnosis not present

## 2019-07-30 DIAGNOSIS — I252 Old myocardial infarction: Secondary | ICD-10-CM | POA: Diagnosis not present

## 2019-07-31 DIAGNOSIS — I2510 Atherosclerotic heart disease of native coronary artery without angina pectoris: Secondary | ICD-10-CM | POA: Diagnosis not present

## 2019-07-31 DIAGNOSIS — I5030 Unspecified diastolic (congestive) heart failure: Secondary | ICD-10-CM | POA: Diagnosis not present

## 2019-07-31 DIAGNOSIS — I252 Old myocardial infarction: Secondary | ICD-10-CM | POA: Diagnosis not present

## 2019-07-31 DIAGNOSIS — T82838D Hemorrhage of vascular prosthetic devices, implants and grafts, subsequent encounter: Secondary | ICD-10-CM | POA: Diagnosis not present

## 2019-07-31 DIAGNOSIS — I1311 Hypertensive heart and chronic kidney disease without heart failure, with stage 5 chronic kidney disease, or end stage renal disease: Secondary | ICD-10-CM | POA: Diagnosis not present

## 2019-07-31 DIAGNOSIS — T827XXD Infection and inflammatory reaction due to other cardiac and vascular devices, implants and grafts, subsequent encounter: Secondary | ICD-10-CM | POA: Diagnosis not present

## 2019-08-01 DIAGNOSIS — Z992 Dependence on renal dialysis: Secondary | ICD-10-CM | POA: Diagnosis not present

## 2019-08-01 DIAGNOSIS — R110 Nausea: Secondary | ICD-10-CM | POA: Diagnosis not present

## 2019-08-01 DIAGNOSIS — R1110 Vomiting, unspecified: Secondary | ICD-10-CM | POA: Diagnosis not present

## 2019-08-01 DIAGNOSIS — N186 End stage renal disease: Secondary | ICD-10-CM | POA: Diagnosis not present

## 2019-08-01 DIAGNOSIS — N2581 Secondary hyperparathyroidism of renal origin: Secondary | ICD-10-CM | POA: Diagnosis not present

## 2019-08-01 DIAGNOSIS — D509 Iron deficiency anemia, unspecified: Secondary | ICD-10-CM | POA: Diagnosis not present

## 2019-08-02 DIAGNOSIS — I2510 Atherosclerotic heart disease of native coronary artery without angina pectoris: Secondary | ICD-10-CM | POA: Diagnosis not present

## 2019-08-02 DIAGNOSIS — I5030 Unspecified diastolic (congestive) heart failure: Secondary | ICD-10-CM | POA: Diagnosis not present

## 2019-08-02 DIAGNOSIS — T827XXD Infection and inflammatory reaction due to other cardiac and vascular devices, implants and grafts, subsequent encounter: Secondary | ICD-10-CM | POA: Diagnosis not present

## 2019-08-02 DIAGNOSIS — I252 Old myocardial infarction: Secondary | ICD-10-CM | POA: Diagnosis not present

## 2019-08-02 DIAGNOSIS — T82838D Hemorrhage of vascular prosthetic devices, implants and grafts, subsequent encounter: Secondary | ICD-10-CM | POA: Diagnosis not present

## 2019-08-02 DIAGNOSIS — I1311 Hypertensive heart and chronic kidney disease without heart failure, with stage 5 chronic kidney disease, or end stage renal disease: Secondary | ICD-10-CM | POA: Diagnosis not present

## 2019-08-03 ENCOUNTER — Other Ambulatory Visit (INDEPENDENT_AMBULATORY_CARE_PROVIDER_SITE_OTHER): Payer: Self-pay | Admitting: Nurse Practitioner

## 2019-08-03 DIAGNOSIS — R1110 Vomiting, unspecified: Secondary | ICD-10-CM | POA: Diagnosis not present

## 2019-08-03 DIAGNOSIS — R110 Nausea: Secondary | ICD-10-CM | POA: Diagnosis not present

## 2019-08-03 DIAGNOSIS — N2581 Secondary hyperparathyroidism of renal origin: Secondary | ICD-10-CM | POA: Diagnosis not present

## 2019-08-03 DIAGNOSIS — D509 Iron deficiency anemia, unspecified: Secondary | ICD-10-CM | POA: Diagnosis not present

## 2019-08-03 DIAGNOSIS — Z992 Dependence on renal dialysis: Secondary | ICD-10-CM | POA: Diagnosis not present

## 2019-08-03 DIAGNOSIS — N186 End stage renal disease: Secondary | ICD-10-CM | POA: Diagnosis not present

## 2019-08-03 MED ORDER — HYDROCODONE-ACETAMINOPHEN 5-325 MG PO TABS: 1.0000 | ORAL_TABLET | Freq: Four times a day (QID) | ORAL | 0 refills | Status: AC | PRN

## 2019-08-06 DIAGNOSIS — R1110 Vomiting, unspecified: Secondary | ICD-10-CM | POA: Diagnosis not present

## 2019-08-06 DIAGNOSIS — T827XXD Infection and inflammatory reaction due to other cardiac and vascular devices, implants and grafts, subsequent encounter: Secondary | ICD-10-CM | POA: Diagnosis not present

## 2019-08-06 DIAGNOSIS — I5030 Unspecified diastolic (congestive) heart failure: Secondary | ICD-10-CM | POA: Diagnosis not present

## 2019-08-06 DIAGNOSIS — N2581 Secondary hyperparathyroidism of renal origin: Secondary | ICD-10-CM | POA: Diagnosis not present

## 2019-08-06 DIAGNOSIS — N186 End stage renal disease: Secondary | ICD-10-CM | POA: Diagnosis not present

## 2019-08-06 DIAGNOSIS — I1311 Hypertensive heart and chronic kidney disease without heart failure, with stage 5 chronic kidney disease, or end stage renal disease: Secondary | ICD-10-CM | POA: Diagnosis not present

## 2019-08-06 DIAGNOSIS — I252 Old myocardial infarction: Secondary | ICD-10-CM | POA: Diagnosis not present

## 2019-08-06 DIAGNOSIS — Z992 Dependence on renal dialysis: Secondary | ICD-10-CM | POA: Diagnosis not present

## 2019-08-06 DIAGNOSIS — R110 Nausea: Secondary | ICD-10-CM | POA: Diagnosis not present

## 2019-08-06 DIAGNOSIS — D509 Iron deficiency anemia, unspecified: Secondary | ICD-10-CM | POA: Diagnosis not present

## 2019-08-06 DIAGNOSIS — T82838D Hemorrhage of vascular prosthetic devices, implants and grafts, subsequent encounter: Secondary | ICD-10-CM | POA: Diagnosis not present

## 2019-08-06 DIAGNOSIS — I2510 Atherosclerotic heart disease of native coronary artery without angina pectoris: Secondary | ICD-10-CM | POA: Diagnosis not present

## 2019-08-07 ENCOUNTER — Telehealth: Payer: Self-pay | Admitting: Pulmonary Disease

## 2019-08-07 ENCOUNTER — Telehealth: Payer: Self-pay | Admitting: Cardiovascular Disease

## 2019-08-07 DIAGNOSIS — T82838D Hemorrhage of vascular prosthetic devices, implants and grafts, subsequent encounter: Secondary | ICD-10-CM | POA: Diagnosis not present

## 2019-08-07 DIAGNOSIS — I1311 Hypertensive heart and chronic kidney disease without heart failure, with stage 5 chronic kidney disease, or end stage renal disease: Secondary | ICD-10-CM | POA: Diagnosis not present

## 2019-08-07 DIAGNOSIS — I252 Old myocardial infarction: Secondary | ICD-10-CM | POA: Diagnosis not present

## 2019-08-07 DIAGNOSIS — T827XXD Infection and inflammatory reaction due to other cardiac and vascular devices, implants and grafts, subsequent encounter: Secondary | ICD-10-CM | POA: Diagnosis not present

## 2019-08-07 DIAGNOSIS — I5030 Unspecified diastolic (congestive) heart failure: Secondary | ICD-10-CM | POA: Diagnosis not present

## 2019-08-07 DIAGNOSIS — I2510 Atherosclerotic heart disease of native coronary artery without angina pectoris: Secondary | ICD-10-CM | POA: Diagnosis not present

## 2019-08-07 NOTE — Telephone Encounter (Signed)
Patients pharmacy calling  Wanted to make office aware that all prescriptions need to go through [facility] Their fax number is [phone] escribe at [email or web address]

## 2019-08-07 NOTE — Telephone Encounter (Addendum)
Upon further research, it appears that pt was seen by Dr. Ashby Dawes 05/2017. When clicking pulmonary tab in encounters, OV does not show.  Pt will need an appt before additional refills can be prescribed.  Left message to make pt aware.

## 2019-08-07 NOTE — Telephone Encounter (Signed)
This is not a patient of our office.  I have spoken to [facility] with [facility], who stated that they do not have a pt in their system with this name/DOB.   Claiborne Billings, please verify. thanks

## 2019-08-07 NOTE — Telephone Encounter (Signed)
Pharmacy updated.

## 2019-08-08 DIAGNOSIS — R1110 Vomiting, unspecified: Secondary | ICD-10-CM | POA: Diagnosis not present

## 2019-08-08 DIAGNOSIS — N186 End stage renal disease: Secondary | ICD-10-CM | POA: Diagnosis not present

## 2019-08-08 DIAGNOSIS — R110 Nausea: Secondary | ICD-10-CM | POA: Diagnosis not present

## 2019-08-08 DIAGNOSIS — D509 Iron deficiency anemia, unspecified: Secondary | ICD-10-CM | POA: Diagnosis not present

## 2019-08-08 DIAGNOSIS — N2581 Secondary hyperparathyroidism of renal origin: Secondary | ICD-10-CM | POA: Diagnosis not present

## 2019-08-08 DIAGNOSIS — Z992 Dependence on renal dialysis: Secondary | ICD-10-CM | POA: Diagnosis not present

## 2019-08-10 DIAGNOSIS — Z992 Dependence on renal dialysis: Secondary | ICD-10-CM | POA: Diagnosis not present

## 2019-08-10 DIAGNOSIS — N2581 Secondary hyperparathyroidism of renal origin: Secondary | ICD-10-CM | POA: Diagnosis not present

## 2019-08-10 DIAGNOSIS — I1311 Hypertensive heart and chronic kidney disease without heart failure, with stage 5 chronic kidney disease, or end stage renal disease: Secondary | ICD-10-CM | POA: Diagnosis not present

## 2019-08-10 DIAGNOSIS — T82838D Hemorrhage of vascular prosthetic devices, implants and grafts, subsequent encounter: Secondary | ICD-10-CM | POA: Diagnosis not present

## 2019-08-10 DIAGNOSIS — D509 Iron deficiency anemia, unspecified: Secondary | ICD-10-CM | POA: Diagnosis not present

## 2019-08-10 DIAGNOSIS — N186 End stage renal disease: Secondary | ICD-10-CM | POA: Diagnosis not present

## 2019-08-10 DIAGNOSIS — I252 Old myocardial infarction: Secondary | ICD-10-CM | POA: Diagnosis not present

## 2019-08-10 DIAGNOSIS — I2510 Atherosclerotic heart disease of native coronary artery without angina pectoris: Secondary | ICD-10-CM | POA: Diagnosis not present

## 2019-08-10 DIAGNOSIS — R1110 Vomiting, unspecified: Secondary | ICD-10-CM | POA: Diagnosis not present

## 2019-08-10 DIAGNOSIS — T827XXD Infection and inflammatory reaction due to other cardiac and vascular devices, implants and grafts, subsequent encounter: Secondary | ICD-10-CM | POA: Diagnosis not present

## 2019-08-10 DIAGNOSIS — R110 Nausea: Secondary | ICD-10-CM | POA: Diagnosis not present

## 2019-08-10 DIAGNOSIS — I5030 Unspecified diastolic (congestive) heart failure: Secondary | ICD-10-CM | POA: Diagnosis not present

## 2019-08-10 NOTE — Telephone Encounter (Signed)
Spoke to pt and attempted to schedule an OV.  Pt wished to contact PCP and request these medications.  Pt will call back to schedule with our office if needed.

## 2019-08-13 DIAGNOSIS — D509 Iron deficiency anemia, unspecified: Secondary | ICD-10-CM | POA: Diagnosis not present

## 2019-08-13 DIAGNOSIS — I252 Old myocardial infarction: Secondary | ICD-10-CM | POA: Diagnosis not present

## 2019-08-13 DIAGNOSIS — R1110 Vomiting, unspecified: Secondary | ICD-10-CM | POA: Diagnosis not present

## 2019-08-13 DIAGNOSIS — T827XXD Infection and inflammatory reaction due to other cardiac and vascular devices, implants and grafts, subsequent encounter: Secondary | ICD-10-CM | POA: Diagnosis not present

## 2019-08-13 DIAGNOSIS — Z992 Dependence on renal dialysis: Secondary | ICD-10-CM | POA: Diagnosis not present

## 2019-08-13 DIAGNOSIS — N2581 Secondary hyperparathyroidism of renal origin: Secondary | ICD-10-CM | POA: Diagnosis not present

## 2019-08-13 DIAGNOSIS — N186 End stage renal disease: Secondary | ICD-10-CM | POA: Diagnosis not present

## 2019-08-13 DIAGNOSIS — I1311 Hypertensive heart and chronic kidney disease without heart failure, with stage 5 chronic kidney disease, or end stage renal disease: Secondary | ICD-10-CM | POA: Diagnosis not present

## 2019-08-13 DIAGNOSIS — I2510 Atherosclerotic heart disease of native coronary artery without angina pectoris: Secondary | ICD-10-CM | POA: Diagnosis not present

## 2019-08-13 DIAGNOSIS — R110 Nausea: Secondary | ICD-10-CM | POA: Diagnosis not present

## 2019-08-13 DIAGNOSIS — I5030 Unspecified diastolic (congestive) heart failure: Secondary | ICD-10-CM | POA: Diagnosis not present

## 2019-08-13 DIAGNOSIS — T82838D Hemorrhage of vascular prosthetic devices, implants and grafts, subsequent encounter: Secondary | ICD-10-CM | POA: Diagnosis not present

## 2019-08-14 DIAGNOSIS — N2581 Secondary hyperparathyroidism of renal origin: Secondary | ICD-10-CM | POA: Diagnosis not present

## 2019-08-14 DIAGNOSIS — I1311 Hypertensive heart and chronic kidney disease without heart failure, with stage 5 chronic kidney disease, or end stage renal disease: Secondary | ICD-10-CM | POA: Diagnosis not present

## 2019-08-14 DIAGNOSIS — I5030 Unspecified diastolic (congestive) heart failure: Secondary | ICD-10-CM | POA: Diagnosis not present

## 2019-08-14 DIAGNOSIS — D631 Anemia in chronic kidney disease: Secondary | ICD-10-CM | POA: Diagnosis not present

## 2019-08-14 DIAGNOSIS — E875 Hyperkalemia: Secondary | ICD-10-CM | POA: Diagnosis not present

## 2019-08-14 DIAGNOSIS — D509 Iron deficiency anemia, unspecified: Secondary | ICD-10-CM | POA: Diagnosis not present

## 2019-08-14 DIAGNOSIS — I252 Old myocardial infarction: Secondary | ICD-10-CM | POA: Diagnosis not present

## 2019-08-14 DIAGNOSIS — T827XXD Infection and inflammatory reaction due to other cardiac and vascular devices, implants and grafts, subsequent encounter: Secondary | ICD-10-CM | POA: Diagnosis not present

## 2019-08-14 DIAGNOSIS — T82838D Hemorrhage of vascular prosthetic devices, implants and grafts, subsequent encounter: Secondary | ICD-10-CM | POA: Diagnosis not present

## 2019-08-14 DIAGNOSIS — N186 End stage renal disease: Secondary | ICD-10-CM | POA: Diagnosis not present

## 2019-08-14 DIAGNOSIS — Z992 Dependence on renal dialysis: Secondary | ICD-10-CM | POA: Diagnosis not present

## 2019-08-14 DIAGNOSIS — I2510 Atherosclerotic heart disease of native coronary artery without angina pectoris: Secondary | ICD-10-CM | POA: Diagnosis not present

## 2019-08-14 DIAGNOSIS — J189 Pneumonia, unspecified organism: Secondary | ICD-10-CM

## 2019-08-14 HISTORY — DX: Pneumonia, unspecified organism: J18.9

## 2019-08-15 DIAGNOSIS — E875 Hyperkalemia: Secondary | ICD-10-CM | POA: Diagnosis not present

## 2019-08-15 DIAGNOSIS — N2581 Secondary hyperparathyroidism of renal origin: Secondary | ICD-10-CM | POA: Diagnosis not present

## 2019-08-15 DIAGNOSIS — D509 Iron deficiency anemia, unspecified: Secondary | ICD-10-CM | POA: Diagnosis not present

## 2019-08-15 DIAGNOSIS — D631 Anemia in chronic kidney disease: Secondary | ICD-10-CM | POA: Diagnosis not present

## 2019-08-15 DIAGNOSIS — Z992 Dependence on renal dialysis: Secondary | ICD-10-CM | POA: Diagnosis not present

## 2019-08-15 DIAGNOSIS — N186 End stage renal disease: Secondary | ICD-10-CM | POA: Diagnosis not present

## 2019-08-16 DIAGNOSIS — I252 Old myocardial infarction: Secondary | ICD-10-CM | POA: Diagnosis not present

## 2019-08-16 DIAGNOSIS — I2510 Atherosclerotic heart disease of native coronary artery without angina pectoris: Secondary | ICD-10-CM | POA: Diagnosis not present

## 2019-08-16 DIAGNOSIS — I1311 Hypertensive heart and chronic kidney disease without heart failure, with stage 5 chronic kidney disease, or end stage renal disease: Secondary | ICD-10-CM | POA: Diagnosis not present

## 2019-08-16 DIAGNOSIS — I5030 Unspecified diastolic (congestive) heart failure: Secondary | ICD-10-CM | POA: Diagnosis not present

## 2019-08-16 DIAGNOSIS — T82838D Hemorrhage of vascular prosthetic devices, implants and grafts, subsequent encounter: Secondary | ICD-10-CM | POA: Diagnosis not present

## 2019-08-16 DIAGNOSIS — T827XXD Infection and inflammatory reaction due to other cardiac and vascular devices, implants and grafts, subsequent encounter: Secondary | ICD-10-CM | POA: Diagnosis not present

## 2019-08-17 DIAGNOSIS — Z992 Dependence on renal dialysis: Secondary | ICD-10-CM | POA: Diagnosis not present

## 2019-08-17 DIAGNOSIS — D509 Iron deficiency anemia, unspecified: Secondary | ICD-10-CM | POA: Diagnosis not present

## 2019-08-17 DIAGNOSIS — N2581 Secondary hyperparathyroidism of renal origin: Secondary | ICD-10-CM | POA: Diagnosis not present

## 2019-08-17 DIAGNOSIS — E875 Hyperkalemia: Secondary | ICD-10-CM | POA: Diagnosis not present

## 2019-08-17 DIAGNOSIS — D631 Anemia in chronic kidney disease: Secondary | ICD-10-CM | POA: Diagnosis not present

## 2019-08-17 DIAGNOSIS — N186 End stage renal disease: Secondary | ICD-10-CM | POA: Diagnosis not present

## 2019-08-19 DIAGNOSIS — I5030 Unspecified diastolic (congestive) heart failure: Secondary | ICD-10-CM | POA: Diagnosis not present

## 2019-08-19 DIAGNOSIS — T82838D Hemorrhage of vascular prosthetic devices, implants and grafts, subsequent encounter: Secondary | ICD-10-CM | POA: Diagnosis not present

## 2019-08-19 DIAGNOSIS — F17200 Nicotine dependence, unspecified, uncomplicated: Secondary | ICD-10-CM | POA: Diagnosis not present

## 2019-08-19 DIAGNOSIS — Z951 Presence of aortocoronary bypass graft: Secondary | ICD-10-CM | POA: Diagnosis not present

## 2019-08-19 DIAGNOSIS — Z992 Dependence on renal dialysis: Secondary | ICD-10-CM | POA: Diagnosis not present

## 2019-08-19 DIAGNOSIS — I480 Paroxysmal atrial fibrillation: Secondary | ICD-10-CM | POA: Diagnosis not present

## 2019-08-19 DIAGNOSIS — K219 Gastro-esophageal reflux disease without esophagitis: Secondary | ICD-10-CM | POA: Diagnosis not present

## 2019-08-19 DIAGNOSIS — T827XXD Infection and inflammatory reaction due to other cardiac and vascular devices, implants and grafts, subsequent encounter: Secondary | ICD-10-CM | POA: Diagnosis not present

## 2019-08-19 DIAGNOSIS — Z7982 Long term (current) use of aspirin: Secondary | ICD-10-CM | POA: Diagnosis not present

## 2019-08-19 DIAGNOSIS — Z79891 Long term (current) use of opiate analgesic: Secondary | ICD-10-CM | POA: Diagnosis not present

## 2019-08-19 DIAGNOSIS — N028 Recurrent and persistent hematuria with other morphologic changes: Secondary | ICD-10-CM | POA: Diagnosis not present

## 2019-08-19 DIAGNOSIS — D631 Anemia in chronic kidney disease: Secondary | ICD-10-CM | POA: Diagnosis not present

## 2019-08-19 DIAGNOSIS — N186 End stage renal disease: Secondary | ICD-10-CM | POA: Diagnosis not present

## 2019-08-19 DIAGNOSIS — I2510 Atherosclerotic heart disease of native coronary artery without angina pectoris: Secondary | ICD-10-CM | POA: Diagnosis not present

## 2019-08-19 DIAGNOSIS — M069 Rheumatoid arthritis, unspecified: Secondary | ICD-10-CM | POA: Diagnosis not present

## 2019-08-19 DIAGNOSIS — Z7901 Long term (current) use of anticoagulants: Secondary | ICD-10-CM | POA: Diagnosis not present

## 2019-08-19 DIAGNOSIS — Z7951 Long term (current) use of inhaled steroids: Secondary | ICD-10-CM | POA: Diagnosis not present

## 2019-08-19 DIAGNOSIS — M1990 Unspecified osteoarthritis, unspecified site: Secondary | ICD-10-CM | POA: Diagnosis not present

## 2019-08-19 DIAGNOSIS — G4730 Sleep apnea, unspecified: Secondary | ICD-10-CM | POA: Diagnosis not present

## 2019-08-19 DIAGNOSIS — L539 Erythematous condition, unspecified: Secondary | ICD-10-CM | POA: Diagnosis not present

## 2019-08-19 DIAGNOSIS — J449 Chronic obstructive pulmonary disease, unspecified: Secondary | ICD-10-CM | POA: Diagnosis not present

## 2019-08-19 DIAGNOSIS — E785 Hyperlipidemia, unspecified: Secondary | ICD-10-CM | POA: Diagnosis not present

## 2019-08-19 DIAGNOSIS — F419 Anxiety disorder, unspecified: Secondary | ICD-10-CM | POA: Diagnosis not present

## 2019-08-19 DIAGNOSIS — I1311 Hypertensive heart and chronic kidney disease without heart failure, with stage 5 chronic kidney disease, or end stage renal disease: Secondary | ICD-10-CM | POA: Diagnosis not present

## 2019-08-19 DIAGNOSIS — I252 Old myocardial infarction: Secondary | ICD-10-CM | POA: Diagnosis not present

## 2019-08-20 DIAGNOSIS — T827XXD Infection and inflammatory reaction due to other cardiac and vascular devices, implants and grafts, subsequent encounter: Secondary | ICD-10-CM | POA: Diagnosis not present

## 2019-08-20 DIAGNOSIS — Z992 Dependence on renal dialysis: Secondary | ICD-10-CM | POA: Diagnosis not present

## 2019-08-20 DIAGNOSIS — N2581 Secondary hyperparathyroidism of renal origin: Secondary | ICD-10-CM | POA: Diagnosis not present

## 2019-08-20 DIAGNOSIS — I1311 Hypertensive heart and chronic kidney disease without heart failure, with stage 5 chronic kidney disease, or end stage renal disease: Secondary | ICD-10-CM | POA: Diagnosis not present

## 2019-08-20 DIAGNOSIS — I5030 Unspecified diastolic (congestive) heart failure: Secondary | ICD-10-CM | POA: Diagnosis not present

## 2019-08-20 DIAGNOSIS — I252 Old myocardial infarction: Secondary | ICD-10-CM | POA: Diagnosis not present

## 2019-08-20 DIAGNOSIS — E875 Hyperkalemia: Secondary | ICD-10-CM | POA: Diagnosis not present

## 2019-08-20 DIAGNOSIS — N186 End stage renal disease: Secondary | ICD-10-CM | POA: Diagnosis not present

## 2019-08-20 DIAGNOSIS — I2510 Atherosclerotic heart disease of native coronary artery without angina pectoris: Secondary | ICD-10-CM | POA: Diagnosis not present

## 2019-08-20 DIAGNOSIS — D631 Anemia in chronic kidney disease: Secondary | ICD-10-CM | POA: Diagnosis not present

## 2019-08-20 DIAGNOSIS — D509 Iron deficiency anemia, unspecified: Secondary | ICD-10-CM | POA: Diagnosis not present

## 2019-08-20 DIAGNOSIS — T82838D Hemorrhage of vascular prosthetic devices, implants and grafts, subsequent encounter: Secondary | ICD-10-CM | POA: Diagnosis not present

## 2019-08-22 DIAGNOSIS — N2581 Secondary hyperparathyroidism of renal origin: Secondary | ICD-10-CM | POA: Diagnosis not present

## 2019-08-22 DIAGNOSIS — Z992 Dependence on renal dialysis: Secondary | ICD-10-CM | POA: Diagnosis not present

## 2019-08-22 DIAGNOSIS — E875 Hyperkalemia: Secondary | ICD-10-CM | POA: Diagnosis not present

## 2019-08-22 DIAGNOSIS — D631 Anemia in chronic kidney disease: Secondary | ICD-10-CM | POA: Diagnosis not present

## 2019-08-22 DIAGNOSIS — D509 Iron deficiency anemia, unspecified: Secondary | ICD-10-CM | POA: Diagnosis not present

## 2019-08-22 DIAGNOSIS — N186 End stage renal disease: Secondary | ICD-10-CM | POA: Diagnosis not present

## 2019-08-23 ENCOUNTER — Ambulatory Visit (INDEPENDENT_AMBULATORY_CARE_PROVIDER_SITE_OTHER): Payer: Medicare Other | Admitting: Family Medicine

## 2019-08-23 ENCOUNTER — Other Ambulatory Visit: Payer: Self-pay

## 2019-08-23 ENCOUNTER — Ambulatory Visit: Payer: Medicare Other | Admitting: Family Medicine

## 2019-08-23 ENCOUNTER — Encounter: Payer: Self-pay | Admitting: Family Medicine

## 2019-08-23 VITALS — BP 179/78 | HR 53 | Temp 96.9°F | Ht 67.0 in | Wt 155.8 lb

## 2019-08-23 DIAGNOSIS — I150 Renovascular hypertension: Secondary | ICD-10-CM | POA: Diagnosis not present

## 2019-08-23 DIAGNOSIS — N186 End stage renal disease: Secondary | ICD-10-CM | POA: Diagnosis not present

## 2019-08-23 NOTE — Patient Instructions (Addendum)
Thank you for coming to the office today.  Change dose of Hydralazine, instead of the 100mg  3 times daily, switch to 75mg  3 times daily  CUT the 100mg  tab in HALF and take HALF of that and also HALF of the 50mg  - if this works we can try to order different pills for you to make it easier.  Let your Nephrology kidney doctor know  Please schedule a Follow-up Appointment to: Return in about 3 months (around 11/21/2019) for Follow-up 3 months HTN ESRD with new provider.  If you have any other questions or concerns, please feel free to call the office or send a message through [facility]. You may also schedule an earlier appointment if necessary.  Additionally, you may be receiving a survey about your experience at our office within a few days to 1 week by e-mail or mail. We value your feedback.  Nobie Putnam, DO [facility]

## 2019-08-23 NOTE — Progress Notes (Signed)
Subjective:    Patient ID: Daryl Weeks, male    DOB: 1960/12/13, 58 y.o.   MRN: [id number]  Daryl Weeks is a 58 y.o. male presenting on 08/23/2019 for Hypertension (elevated bp 214/100 on yesterday. Pt report bp been elevated over the last 2 weeks )   HPI   CHRONIC HTN / ESRD History of complicated hospitalization in past 2-3 months, has had pneumonia, and then complication of AV fistula in L arm with secondary infection. He has had multiple medicines changed.  Next HD tomorrow. Then 2 hours on Saturday.  He was given increase on Hydralazine 50mg  TID up to 100mg  TID, he has had intolerance cannot tolerate that dose.  Current Meds - see med rec   Reports good compliance, took meds today. Tolerating well, w/o complaints. Denies CP, dyspnea, HA, edema, dizziness / lightheadedness   Depression screen [facility] 2/9 08/23/2019 02/21/2018 10/04/2017  Decreased Interest 0 0 1  Down, Depressed, Hopeless 0 3 3  PHQ - 2 Score 0 3 4  Altered sleeping - 3 2  Tired, decreased energy - 0 2  Change in appetite - 0 0  Feeling bad or failure about yourself  - 0 2  Trouble concentrating - 2 3  Moving slowly or fidgety/restless - 0 0  Suicidal thoughts - 0 0  PHQ-9 Score - 8 13  Difficult doing work/chores - Somewhat difficult Somewhat difficult    Social History   Tobacco Use  . Smoking status: Current Every Day Smoker    Packs/day: 0.50    Years: 32.00    Pack years: 16.00    Types: Cigarettes  . Smokeless tobacco: Never Used  . Tobacco comment: daily  Substance Use Topics  . Alcohol use: Yes    Alcohol/week: 7.0 standard drinks    Types: 7 Cans of beer per week  . Drug use: No    Current Outpatient Medications:  .  amiodarone (PACERONE) 400 MG tablet, Take 0.5 tablets (200 mg total) by mouth 2 (two) times daily., Disp: 60 tablet, Rfl: 0 .  amLODipine (NORVASC) 10 MG tablet, take 1 tablet by mouth once daily, Disp: 30 tablet, Rfl: 5 .  apixaban (ELIQUIS) 2.5 MG TABS tablet, Take  2.5 mg by mouth 2 (two) times daily., Disp: , Rfl:  .  aspirin EC 81 MG EC tablet, Take 1 tablet (81 mg total) by mouth daily., Disp: 90 tablet, Rfl: 3 .  atorvastatin (LIPITOR) 20 MG tablet, take 1 tablet by mouth at bedtime, Disp: 30 tablet, Rfl: 6 .  budesonide (PULMICORT FLEXHALER) 180 MCG/ACT inhaler, Inhale 1 puff into the lungs 2 (two) times daily., Disp: 1 each, Rfl: 11 .  budesonide (PULMICORT) 0.5 MG/2ML nebulizer solution, Take 2 mLs (0.5 mg total) by nebulization every 12 (twelve) hours., Disp: 60 mL, Rfl: 11 .  calcium carbonate (TUMS - DOSED IN MG ELEMENTAL CALCIUM) 500 MG chewable tablet, Chew 1 tablet by mouth as needed. , Disp: , Rfl:  .  carvedilol (COREG) 25 MG tablet, Take 1 tablet (25 mg total) by mouth 2 (two) times daily with a meal., Disp: 60 tablet, Rfl: 0 .  cinacalcet (SENSIPAR) 30 MG tablet, Take 30 mg by mouth daily with supper. , Disp: , Rfl:  .  cloNIDine (CATAPRES) 0.2 MG tablet, Take 1 tablet (0.2 mg total) by mouth 3 (three) times daily., Disp: 90 tablet, Rfl: 0 .  docusate sodium (COLACE) 100 MG capsule, Take 1 capsule (100 mg total) by mouth 2 (two)  times daily as needed for mild constipation., Disp: 10 capsule, Rfl: 0 .  furosemide (LASIX) 80 MG tablet, Take 80 mg by mouth 2 (two) times daily. , Disp: , Rfl:  .  gabapentin (NEURONTIN) 100 MG capsule, TK 1 C PO TID, Disp: , Rfl: 11 .  HYDROcodone-acetaminophen (NORCO/VICODIN) 5-325 MG tablet, Take 1 tablet by mouth at bedtime., Disp: , Rfl:  .  irbesartan (AVAPRO) 300 MG tablet, Take 1 tablet (300 mg total) by mouth at bedtime., Disp: 60 tablet, Rfl: 0 .  levofloxacin (LEVAQUIN) 500 MG tablet, Take 500 mg by mouth daily., Disp: , Rfl:  .  loratadine (CLARITIN) 10 MG tablet, Take 1 tablet (10 mg total) by mouth daily., Disp: 30 tablet, Rfl: 0 .  multivitamin (RENA-VIT) TABS tablet, Take 1 tablet by mouth daily. , Disp: , Rfl: 11 .  ondansetron (ZOFRAN) 4 MG tablet, TK 1 T PO BID PRF NAUSEA, Disp: , Rfl: 1 .   sevelamer carbonate (RENVELA) 800 MG tablet, Take 2,400 mg by mouth daily. , Disp: , Rfl:  .  tiotropium (SPIRIVA) 18 MCG inhalation capsule, Place 1 capsule (18 mcg total) into inhaler and inhale daily. (Patient taking differently: Place 18 mcg into inhaler and inhale every evening. ), Disp: 30 capsule, Rfl: 12 .  zolpidem (AMBIEN) 10 MG tablet, Take 10 mg by mouth at bedtime., Disp: , Rfl:  .  albuterol (VENTOLIN HFA) 108 (90 Base) MCG/ACT inhaler, Inhale 2 puffs into the lungs every 4 (four) hours as needed for wheezing or shortness of breath. , Disp: , Rfl:  .  AURYXIA 1 GM 210 MG(Fe) tablet, TK 1 T PO  TID BEFORE EACH MEAL, Disp: , Rfl: 11 .  hydrALAZINE (APRESOLINE) 100 MG tablet, Take 1 tablet (100 mg total) by mouth every 6 (six) hours. (Patient not taking: Reported on 08/23/2019), Disp: 180 tablet, Rfl: 0 .  irbesartan (AVAPRO) 300 MG tablet, Take 1 tablet (300 mg total) by mouth at bedtime. (Patient not taking: Reported on 08/23/2019), Disp: 30 tablet, Rfl: 0 .  nitroGLYCERIN (NITROSTAT) 0.4 MG SL tablet, Place 1 tablet (0.4 mg total) under the tongue every 5 (five) minutes as needed for chest pain. (Patient not taking: Reported on 08/23/2019), Disp: 10 tablet, Rfl: 0   Review of Systems Per HPI unless specifically indicated above     Objective:    BP (!) 179/78 (BP Location: Right Arm, Patient Position: Sitting, Cuff Size: Normal)   Pulse (!) 53   Temp (!) 96.9 F (36.1 C) (Oral)   Ht 5\' 7"  (1.702 m)   Wt 155 lb 12.8 oz (70.7 kg)   BMI 24.40 kg/m   Wt Readings from Last 3 Encounters:  08/23/19 155 lb 12.8 oz (70.7 kg)  07/24/19 160 lb (72.6 kg)  07/17/19 162 lb 0.6 oz (73.5 kg)    Physical Exam Vitals and nursing note reviewed.  Constitutional:      General: He is not in acute distress.    Appearance: He is well-developed. He is not diaphoretic.     Comments: Well-appearing, comfortable, cooperative  HENT:     Head: Normocephalic and atraumatic.  Eyes:     General:         Right eye: No discharge.        Left eye: No discharge.     Conjunctiva/sclera: Conjunctivae normal.  Neck:     Thyroid: No thyromegaly.  Cardiovascular:     Rate and Rhythm: Normal rate and regular rhythm.  Heart sounds: Normal heart sounds. No murmur.  Pulmonary:     Effort: Pulmonary effort is normal. No respiratory distress.     Breath sounds: Normal breath sounds. No wheezing or rales.  Musculoskeletal:        General: Normal range of motion.     Cervical back: Normal range of motion and neck supple.  Lymphadenopathy:     Cervical: No cervical adenopathy.  Skin:    General: Skin is warm and dry.     Findings: No erythema or rash.  Neurological:     Mental Status: He is alert and oriented to person, place, and time.  Psychiatric:        Behavior: Behavior normal.     Comments: Well groomed, good eye contact, normal speech and thoughts       Results for orders placed or performed during the hospital encounter of 07/13/19  SARS Coronavirus 2 by RT PCR (hospital order, performed in [place] hospital lab) Nasopharyngeal Nasopharyngeal Swab   Specimen: Nasopharyngeal Swab  Result Value Ref Range   SARS Coronavirus 2 NEGATIVE NEGATIVE  Culture, blood (single) w Reflex to ID Panel   Specimen: BLOOD  Result Value Ref Range   Specimen Description BLOOD RAC    Special Requests      BOTTLES DRAWN AEROBIC AND ANAEROBIC Blood Culture results may not be optimal due to an excessive volume of blood received in culture bottles   Culture      NO GROWTH 5 DAYS Performed at [facility], [address] [place], [place] [address]    Report Status 07/18/2019 FINAL   APTT  Result Value Ref Range   aPTT 38 (H) 24 - 36 seconds  Basic metabolic panel  Result Value Ref Range   Sodium 135 135 - 145 mmol/L   Potassium 3.7 3.5 - 5.1 mmol/L   Chloride 97 (L) 98 - 111 mmol/L   CO2 28 22 - 32 mmol/L   Glucose, Bld 101 (H) 70 - 99 mg/dL   BUN 11 6 - 20 mg/dL    Creatinine, Ser 3.72 (H) 0.61 - 1.24 mg/dL   Calcium 8.2 (L) 8.9 - 10.3 mg/dL   GFR calc non Af Amer 17 (L) >60 mL/min   GFR calc Af Amer 20 (L) >60 mL/min   Anion gap 10 5 - 15  CBC WITH DIFFERENTIAL  Result Value Ref Range   WBC 5.6 4.0 - 10.5 K/uL   RBC 2.55 (L) 4.22 - 5.81 MIL/uL   Hemoglobin 9.1 (L) 13.0 - 17.0 g/dL   HCT 27.1 (L) 39.0 - 52.0 %   MCV 106.3 (H) 80.0 - 100.0 fL   MCH 35.7 (H) 26.0 - 34.0 pg   MCHC 33.6 30.0 - 36.0 g/dL   RDW 15.7 (H) 11.5 - 15.5 %   Platelets 250 150 - 400 K/uL   nRBC 0.0 0.0 - 0.2 %   Neutrophils Relative % 51 %   Neutro Abs 2.8 1.7 - 7.7 K/uL   Lymphocytes Relative 17 %   Lymphs Abs 1.0 0.7 - 4.0 K/uL   Monocytes Relative 17 %   Monocytes Absolute 0.9 0.1 - 1.0 K/uL   Eosinophils Relative 14 %   Eosinophils Absolute 0.8 (H) 0.0 - 0.5 K/uL   Basophils Relative 1 %   Basophils Absolute 0.1 0.0 - 0.1 K/uL   Immature Granulocytes 0 %   Abs Immature Granulocytes 0.02 0.00 - 0.07 K/uL  Protime-INR  Result Value Ref Range   Prothrombin  Time 17.5 (H) 11.4 - 15.2 seconds   INR 1.5 (H) 0.8 - 1.2  Basic metabolic panel  Result Value Ref Range   Sodium 132 (L) 135 - 145 mmol/L   Potassium 5.1 3.5 - 5.1 mmol/L   Chloride 93 (L) 98 - 111 mmol/L   CO2 29 22 - 32 mmol/L   Glucose, Bld 124 (H) 70 - 99 mg/dL   BUN 20 6 - 20 mg/dL   Creatinine, Ser 6.14 (H) 0.61 - 1.24 mg/dL   Calcium 8.4 (L) 8.9 - 10.3 mg/dL   GFR calc non Af Amer 9 (L) >60 mL/min   GFR calc Af Amer 11 (L) >60 mL/min   Anion gap 10 5 - 15  CBC  Result Value Ref Range   WBC 8.5 4.0 - 10.5 K/uL   RBC 2.39 (L) 4.22 - 5.81 MIL/uL   Hemoglobin 8.5 (L) 13.0 - 17.0 g/dL   HCT 25.1 (L) 39.0 - 52.0 %   MCV 105.0 (H) 80.0 - 100.0 fL   MCH 35.6 (H) 26.0 - 34.0 pg   MCHC 33.9 30.0 - 36.0 g/dL   RDW 15.8 (H) 11.5 - 15.5 %   Platelets 267 150 - 400 K/uL   nRBC 0.0 0.0 - 0.2 %  Magnesium  Result Value Ref Range   Magnesium 2.2 1.7 - 2.4 mg/dL  Hemoglobin and hematocrit, blood   Result Value Ref Range   Hemoglobin 7.9 (L) 13.0 - 17.0 g/dL   HCT 24.0 (L) 39.0 - [id number] %  Basic metabolic panel  Result Value Ref Range   Sodium 133 (L) 135 - 145 mmol/L   Potassium 4.8 3.5 - 5.1 mmol/L   Chloride 92 (L) 98 - 111 mmol/L   CO2 29 22 - 32 mmol/L   Glucose, Bld 101 (H) 70 - 99 mg/dL   BUN 32 (H) 6 - 20 mg/dL   Creatinine, Ser 8.35 (H) 0.61 - 1.24 mg/dL   Calcium 8.0 (L) 8.9 - 10.3 mg/dL   GFR calc non Af Amer 6 (L) >60 mL/min   GFR calc Af Amer 7 (L) >60 mL/min   Anion gap 12 5 - 15  CBC  Result Value Ref Range   WBC 10.4 4.0 - 10.5 K/uL   RBC 2.12 (L) 4.22 - 5.81 MIL/uL   Hemoglobin 7.4 (L) 13.0 - 17.0 g/dL   HCT 22.4 (L) 39.0 - 52.0 %   MCV 105.7 (H) 80.0 - 100.0 fL   MCH 34.9 (H) 26.0 - 34.0 pg   MCHC 33.0 30.0 - 36.0 g/dL   RDW 15.7 (H) 11.5 - 15.5 %   Platelets 271 150 - 400 K/uL   nRBC 0.0 0.0 - 0.2 %  Basic metabolic panel  Result Value Ref Range   Sodium 131 (L) 135 - 145 mmol/L   Potassium 5.5 (H) 3.5 - 5.1 mmol/L   Chloride 91 (L) 98 - 111 mmol/L   CO2 29 22 - 32 mmol/L   Glucose, Bld 90 70 - 99 mg/dL   BUN 43 (H) 6 - 20 mg/dL   Creatinine, Ser 10.47 (H) 0.61 - 1.24 mg/dL   Calcium 7.7 (L) 8.9 - 10.3 mg/dL   GFR calc non Af Amer 5 (L) >60 mL/min   GFR calc Af Amer 6 (L) >60 mL/min   Anion gap 11 5 - 15  CBC  Result Value Ref Range   WBC 9.5 4.0 - 10.5 K/uL   RBC 1.88 (L) 4.22 - 5.81 MIL/uL  Hemoglobin 6.8 (L) 13.0 - 17.0 g/dL   HCT 20.2 (L) 39.0 - 52.0 %   MCV 107.4 (H) 80.0 - 100.0 fL   MCH 36.2 (H) 26.0 - 34.0 pg   MCHC 33.7 30.0 - 36.0 g/dL   RDW 15.8 (H) 11.5 - 15.5 %   Platelets 244 150 - 400 K/uL   nRBC 0.0 0.0 - 0.2 %  Glucose, capillary  Result Value Ref Range   Glucose-Capillary 127 (H) 70 - 99 mg/dL  CBC  Result Value Ref Range   WBC 8.5 4.0 - 10.5 K/uL   RBC 2.20 (L) 4.22 - 5.81 MIL/uL   Hemoglobin 7.3 (L) 13.0 - 17.0 g/dL   HCT 22.5 (L) 39.0 - 52.0 %   MCV 102.3 (H) 80.0 - 100.0 fL   MCH 33.2 26.0 - 34.0 pg   MCHC  32.4 30.0 - 36.0 g/dL   RDW 19.5 (H) 11.5 - 15.5 %   Platelets 214 150 - 400 K/uL   nRBC 0.0 0.0 - 0.2 %  Basic metabolic panel  Result Value Ref Range   Sodium 135 135 - 145 mmol/L   Potassium 4.4 3.5 - 5.1 mmol/L   Chloride 95 (L) 98 - 111 mmol/L   CO2 27 22 - 32 mmol/L   Glucose, Bld 94 70 - 99 mg/dL   BUN 20 6 - 20 mg/dL   Creatinine, Ser 6.43 (H) 0.61 - 1.24 mg/dL   Calcium 7.9 (L) 8.9 - 10.3 mg/dL   GFR calc non Af Amer 9 (L) >60 mL/min   GFR calc Af Amer 10 (L) >60 mL/min   Anion gap 13 5 - 15  Magnesium  Result Value Ref Range   Magnesium 2.5 (H) 1.7 - 2.4 mg/dL  Hemoglobin and hematocrit, blood  Result Value Ref Range   Hemoglobin 7.3 (L) 13.0 - 17.0 g/dL   HCT 21.8 (L) 39.0 - 52.0 %  Type and screen  Result Value Ref Range   ABO/RH(D) A POS    Antibody Screen NEG    Sample Expiration 07/16/2019,2359    Unit Number [id number]    Blood Component Type RED CELLS,LR    Unit division 00    Status of Unit ISSUED,FINAL    Transfusion Status OK TO TRANSFUSE    Crossmatch Result      Compatible Performed at [facility], [address] [place], [place] [address]   Prepare RBC  Result Value Ref Range   Order Confirmation      ORDER PROCESSED BY BLOOD BANK Performed at [facility], [address], [place], [place] [address]   Surgical pathology  Result Value Ref Range   SURGICAL PATHOLOGY      SURGICAL PATHOLOGY CASE: [id number] PATIENT: Josedaniel Hitchner Surgical Pathology Report     Specimen Submitted: A. AV graft and surrounding tissue, L arm  Clinical History: ESRD. End stage renal disease.      DIAGNOSIS: A. AV GRAFT AND SURROUNDING TISSUE, LEFT ARM; EXCISION: - VASCULAR STRUCTURE AND FIBROVASCULAR AND ADIPOSE TISSUE WITH ORGANIZING THROMBUS AND INFLAMED GRANULATION TISSUE WITH FOCAL MICROABSCESSES.   GROSS DESCRIPTION: A. Labeled: Left arm infected AV graft and surrounding tissue Received: In formalin Tissue  fragment(s): Multiple Size: Aggregate 6 x 6 x 1.0 cm Description: Multifragmented vascular graft surrounded by hemorrhagic necrotic soft tissue Representatively submitted in 1 cassette.    Final Diagnosis performed by Raynelle Bring, MD.   Electronically signed 07/17/2019 8:55:33AM The electronic signature indicates that the named Attending Pathologist has  evaluated the specimen Technical component performed at [facility], [address] [place], [place] [address] Lab: [phone] Dir: Rush Farmer, MD, MMM  Professional component performed at [facility], [facility], [place], [place], [place] [address] Lab: [phone] Dir: Dellia Nims. Rubinas, MD   BPAM RBC  Result Value Ref Range   ISSUE DATE / TIME [id number]    Blood Product Unit Number [id number]    PRODUCT CODE [id number]    Unit Type and Rh 0600    Blood Product Expiration Date [id number]       Assessment & Plan:   Problem List Items Addressed This Visit    Hypertensive disorder - Primary   Relevant Medications   apixaban (ELIQUIS) 2.5 MG TABS tablet   End stage renal disease (HCC)      Clinically with complex renovascular HTN in setting of ESRD on HD Other chronic medical conditions as well as recent hospitalizations and surgery affecting his BP He is on nearly max BP regimen at this time, has had elevated BP at HD recently and this week Uncertain exact triggers for new BP fluctuation. He has tried Hydralazine 100mg  TID but has not been able to tolerate it and therefore had higher BP as a result. Advised him he may reduce dose to 75mg  TID - he has 50 and 100mg  tabs he has pill cutter and agrees to cut both tabs in HALF and take HALF of both = 75mg  TID for now. He will notify nephrology at HD tomorrow and monitor BP closely Given complexity of this patient, my first visit with him and med review - I would prefer that his current longstanding nephrology would make other changes to his HTN  regimen - will message Dr Candiss Norse.   No orders of the defined types were placed in this encounter.     Follow up plan: Return in about 3 months (around 11/21/2019) for Follow-up 3 months HTN ESRD with new provider.   Nobie Putnam, DO [facility] 08/23/2019, 8:18 AM

## 2019-08-24 DIAGNOSIS — N2581 Secondary hyperparathyroidism of renal origin: Secondary | ICD-10-CM | POA: Diagnosis not present

## 2019-08-24 DIAGNOSIS — D509 Iron deficiency anemia, unspecified: Secondary | ICD-10-CM | POA: Diagnosis not present

## 2019-08-24 DIAGNOSIS — D631 Anemia in chronic kidney disease: Secondary | ICD-10-CM | POA: Diagnosis not present

## 2019-08-24 DIAGNOSIS — E875 Hyperkalemia: Secondary | ICD-10-CM | POA: Diagnosis not present

## 2019-08-24 DIAGNOSIS — N186 End stage renal disease: Secondary | ICD-10-CM | POA: Diagnosis not present

## 2019-08-24 DIAGNOSIS — Z992 Dependence on renal dialysis: Secondary | ICD-10-CM | POA: Diagnosis not present

## 2019-08-25 DIAGNOSIS — E8779 Other fluid overload: Secondary | ICD-10-CM | POA: Diagnosis not present

## 2019-08-25 DIAGNOSIS — N186 End stage renal disease: Secondary | ICD-10-CM | POA: Diagnosis not present

## 2019-08-25 DIAGNOSIS — Z992 Dependence on renal dialysis: Secondary | ICD-10-CM | POA: Diagnosis not present

## 2019-08-27 DIAGNOSIS — Z992 Dependence on renal dialysis: Secondary | ICD-10-CM | POA: Diagnosis not present

## 2019-08-27 DIAGNOSIS — N2581 Secondary hyperparathyroidism of renal origin: Secondary | ICD-10-CM | POA: Diagnosis not present

## 2019-08-27 DIAGNOSIS — E875 Hyperkalemia: Secondary | ICD-10-CM | POA: Diagnosis not present

## 2019-08-27 DIAGNOSIS — N186 End stage renal disease: Secondary | ICD-10-CM | POA: Diagnosis not present

## 2019-08-27 DIAGNOSIS — D509 Iron deficiency anemia, unspecified: Secondary | ICD-10-CM | POA: Diagnosis not present

## 2019-08-27 DIAGNOSIS — D631 Anemia in chronic kidney disease: Secondary | ICD-10-CM | POA: Diagnosis not present

## 2019-08-29 DIAGNOSIS — D631 Anemia in chronic kidney disease: Secondary | ICD-10-CM | POA: Diagnosis not present

## 2019-08-29 DIAGNOSIS — Z992 Dependence on renal dialysis: Secondary | ICD-10-CM | POA: Diagnosis not present

## 2019-08-29 DIAGNOSIS — D509 Iron deficiency anemia, unspecified: Secondary | ICD-10-CM | POA: Diagnosis not present

## 2019-08-29 DIAGNOSIS — N186 End stage renal disease: Secondary | ICD-10-CM | POA: Diagnosis not present

## 2019-08-29 DIAGNOSIS — E875 Hyperkalemia: Secondary | ICD-10-CM | POA: Diagnosis not present

## 2019-08-29 DIAGNOSIS — N2581 Secondary hyperparathyroidism of renal origin: Secondary | ICD-10-CM | POA: Diagnosis not present

## 2019-08-30 ENCOUNTER — Other Ambulatory Visit (INDEPENDENT_AMBULATORY_CARE_PROVIDER_SITE_OTHER): Payer: Self-pay | Admitting: Nurse Practitioner

## 2019-08-30 DIAGNOSIS — N186 End stage renal disease: Secondary | ICD-10-CM

## 2019-08-31 DIAGNOSIS — D509 Iron deficiency anemia, unspecified: Secondary | ICD-10-CM | POA: Diagnosis not present

## 2019-08-31 DIAGNOSIS — E875 Hyperkalemia: Secondary | ICD-10-CM | POA: Diagnosis not present

## 2019-08-31 DIAGNOSIS — N186 End stage renal disease: Secondary | ICD-10-CM | POA: Diagnosis not present

## 2019-08-31 DIAGNOSIS — Z992 Dependence on renal dialysis: Secondary | ICD-10-CM | POA: Diagnosis not present

## 2019-08-31 DIAGNOSIS — N2581 Secondary hyperparathyroidism of renal origin: Secondary | ICD-10-CM | POA: Diagnosis not present

## 2019-08-31 DIAGNOSIS — D631 Anemia in chronic kidney disease: Secondary | ICD-10-CM | POA: Diagnosis not present

## 2019-09-01 DIAGNOSIS — Z992 Dependence on renal dialysis: Secondary | ICD-10-CM | POA: Diagnosis not present

## 2019-09-01 DIAGNOSIS — D631 Anemia in chronic kidney disease: Secondary | ICD-10-CM | POA: Diagnosis not present

## 2019-09-01 DIAGNOSIS — N186 End stage renal disease: Secondary | ICD-10-CM | POA: Diagnosis not present

## 2019-09-01 DIAGNOSIS — D509 Iron deficiency anemia, unspecified: Secondary | ICD-10-CM | POA: Diagnosis not present

## 2019-09-01 DIAGNOSIS — N2581 Secondary hyperparathyroidism of renal origin: Secondary | ICD-10-CM | POA: Diagnosis not present

## 2019-09-01 DIAGNOSIS — E875 Hyperkalemia: Secondary | ICD-10-CM | POA: Diagnosis not present

## 2019-09-03 ENCOUNTER — Ambulatory Visit (INDEPENDENT_AMBULATORY_CARE_PROVIDER_SITE_OTHER): Payer: Medicare Other

## 2019-09-03 ENCOUNTER — Other Ambulatory Visit: Payer: Self-pay

## 2019-09-03 ENCOUNTER — Ambulatory Visit (INDEPENDENT_AMBULATORY_CARE_PROVIDER_SITE_OTHER): Payer: Medicare Other | Admitting: Vascular Surgery

## 2019-09-03 ENCOUNTER — Encounter (INDEPENDENT_AMBULATORY_CARE_PROVIDER_SITE_OTHER): Payer: Self-pay | Admitting: Vascular Surgery

## 2019-09-03 VITALS — BP 178/81 | HR 61 | Resp 15 | Wt 157.0 lb

## 2019-09-03 DIAGNOSIS — N2581 Secondary hyperparathyroidism of renal origin: Secondary | ICD-10-CM | POA: Diagnosis not present

## 2019-09-03 DIAGNOSIS — D509 Iron deficiency anemia, unspecified: Secondary | ICD-10-CM | POA: Diagnosis not present

## 2019-09-03 DIAGNOSIS — N186 End stage renal disease: Secondary | ICD-10-CM

## 2019-09-03 DIAGNOSIS — T829XXD Unspecified complication of cardiac and vascular prosthetic device, implant and graft, subsequent encounter: Secondary | ICD-10-CM

## 2019-09-03 DIAGNOSIS — E875 Hyperkalemia: Secondary | ICD-10-CM | POA: Diagnosis not present

## 2019-09-03 DIAGNOSIS — Z992 Dependence on renal dialysis: Secondary | ICD-10-CM | POA: Diagnosis not present

## 2019-09-03 DIAGNOSIS — D631 Anemia in chronic kidney disease: Secondary | ICD-10-CM | POA: Diagnosis not present

## 2019-09-03 NOTE — Progress Notes (Signed)
Patient ID: Daryl Weeks, male   DOB: 07/15/1961, 58 y.o.   MRN: [id number]  No chief complaint on file.   HPI Daryl Weeks is a 58 y.o. male.    07/13/2019: Ligation of native portion of AV fistula with excision of the entire Artegraft and the surrounding infected hematoma with negative pressure dressing placement  He reports his left arm has done well he denies pain.  Wounds are healed.  His catheter is working okay  Past Medical History:  Diagnosis Date  . (HFpEF) heart failure with preserved ejection fraction ([place])    a. 05/2019 Echo: EF 50-55%, diast dysfxn, RVSP 56.20mmHg, Sev dil LA. Mildly dil PA.  [name] Anemia   . Anxiety   . Arthritis   . Colon polyps   . COPD (chronic obstructive pulmonary disease) ([place])   . Coronary artery disease    a. 10/2013 NSTEMI/Cath: Severe 3VD-->CABG x 4 @ [facility] 01/2014 (LIMA->LAD, VG->OM1->OM2, VG->RCA); b. 11/2014 MV: No isch/infarct; c. 05/2019 MV: EF 40% (50-55 by echo), small,mild, fixed apical defect - ? atten vs infarct. No ischemia->low risk.  . ESRD (end stage renal disease) ([place])    a. MWFSat HD  . ETOH abuse    a. 2 beers/night.  [name] GERD (gastroesophageal reflux disease)   . History of pneumonia   . Hyperlipidemia   . Hypertension   . IgA nephropathy   . Lower GI bleed    a. Due to colon polyps. Status post resection of 14 polyps  . Non-ST elevation MI (NSTEMI) ([place])   . PAF (paroxysmal atrial fibrillation) ([place])    a. Dx 05/2019. CHA2DS2VASc = 3-->Eliquis/Amio.  [name] Rheumatoid arthritis ([place])   . Shortness of breath   . Sleep apnea   . Tobacco abuse     Past Surgical History:  Procedure Laterality Date  . A/V FISTULAGRAM Left 10/28/2016   Procedure: A/V Fistulagram;  Surgeon: [name], MD;  Location: [place] [facility];  Service: Cardiovascular;  Laterality: Left;  . A/V FISTULAGRAM Left 12/07/2018   Procedure: A/V FISTULAGRAM;  Surgeon: [name], MD;  Location: [place] [facility];  Service: Cardiovascular;  Laterality:  Left;  . A/V FISTULAGRAM N/A 06/04/2019   Procedure: A/V Fistulagram- LEFT;  Surgeon: [name], MD;  Location: [place] [facility];  Service: Cardiovascular;  Laterality: N/A;  . A/V SHUNT INTERVENTION N/A 10/28/2016   Procedure: A/V Shunt Intervention;  Surgeon: [name], MD;  Location: [place] [facility];  Service: Cardiovascular;  Laterality: N/A;  . A/V SHUNTOGRAM Left 02/01/2019   Procedure: A/V SHUNTOGRAM;  Surgeon: [name], MD;  Location: [place] [facility];  Service: Cardiovascular;  Laterality: Left;  . AV FISTULA PLACEMENT    . CARDIAC CATHETERIZATION     RCA 90% and calcified mid LAD 80% Stenosis  . CORONARY ARTERY BYPASS GRAFT N/A 11/29/2013   Procedure: CORONARY ARTERY BYPASS GRAFTING (CABG) x 4 using endoscopically harvested right saphenous vein and left internal mammary artery;  Surgeon: [name], MD;  Location: [place] [facility];  Service: Open Heart Surgery;  Laterality: N/A;  . dialysis catheterr  2/15  . DIALYSIS/PERMA CATHETER INSERTION N/A 07/16/2019   Procedure: DIALYSIS/PERMA CATHETER INSERTION WITH VAC CHANGE UNDER SEDATION;  Surgeon: [name], MD;  Location: [place] [facility];  Service: Cardiovascular;  Laterality: N/A;  . DIALYSIS/PERMA CATHETER REMOVAL N/A 02/15/2019   Procedure: DIALYSIS/PERMA CATHETER REMOVAL;  Surgeon: [name], MD;  Location: [place]  [facility];  Service: Cardiovascular;  Laterality: N/A;  . INSERTION OF DIALYSIS CATHETER N/A 12/14/2018   Procedure: INSERTION OF DIALYSIS CATHETER ( [place] );  Surgeon: [name], MD;  Location: [facility];  Service: Vascular;  Laterality: N/A;  . INTRAOPERATIVE TRANSESOPHAGEAL ECHOCARDIOGRAM N/A 11/29/2013   Procedure: INTRAOPERATIVE TRANSESOPHAGEAL ECHOCARDIOGRAM;  Surgeon: [name], MD;  Location: [place] [facility];  Service: Open Heart Surgery;  Laterality: N/A;  . LIGATION OF ARTERIOVENOUS  FISTULA Left 07/13/2019   Procedure: LIGATION OF ARTERIOVENOUS  FISTULA;  Surgeon: [name], MD;  Location:  [facility];  Service: Vascular;  Laterality: Left;  . PERIPHERAL VASCULAR CATHETERIZATION N/A 06/12/2015   Procedure: A/V Shuntogram/Fistulagram;  Surgeon: [name], MD;  Location: [place] [facility];  Service: Cardiovascular;  Laterality: N/A;  . PERIPHERAL VASCULAR CATHETERIZATION Left 06/12/2015   Procedure: A/V Shunt Intervention;  Surgeon: [name], MD;  Location: [place] [facility];  Service: Cardiovascular;  Laterality: Left;  . RENAL BIOPSY Left 14  . REVISON OF ARTERIOVENOUS FISTULA Left 12/14/2018   Procedure: REVISON OF ARTERIOVENOUS FISTULA;  Surgeon: [name], MD;  Location: [facility];  Service: Vascular;  Laterality: Left;      Allergies  Allergen Reactions  . Lisinopril     Current Outpatient Medications  Medication Sig Dispense Refill  . albuterol (VENTOLIN HFA) 108 (90 Base) MCG/ACT inhaler Inhale 2 puffs into the lungs every 4 (four) hours as needed for wheezing or shortness of breath.     [name] amiodarone (PACERONE) 400 MG tablet Take 0.5 tablets (200 mg total) by mouth 2 (two) times daily. 60 tablet 0  . amLODipine (NORVASC) 10 MG tablet take 1 tablet by mouth once daily 30 tablet 5  . apixaban (ELIQUIS) 2.5 MG TABS tablet Take 2.5 mg by mouth 2 (two) times daily.    [name] aspirin EC 81 MG EC tablet Take 1 tablet (81 mg total) by mouth daily. 90 tablet 3  . atorvastatin (LIPITOR) 20 MG tablet take 1 tablet by mouth at bedtime 30 tablet 6  . AURYXIA 1 GM 210 MG(Fe) tablet TK 1 T PO  TID BEFORE EACH MEAL  11  . budesonide (PULMICORT FLEXHALER) 180 MCG/ACT inhaler Inhale 1 puff into the lungs 2 (two) times daily. 1 each 11  . budesonide (PULMICORT) 0.5 MG/2ML nebulizer solution Take 2 mLs (0.5 mg total) by nebulization every 12 (twelve) hours. 60 mL 11  . calcium carbonate (TUMS - DOSED IN MG ELEMENTAL CALCIUM) 500 MG chewable tablet Chew 1 tablet by mouth as needed.     . carvedilol (COREG) 25 MG tablet Take 1 tablet (25 mg total) by mouth 2 (two) times daily with a  meal. 60 tablet 0  . cinacalcet (SENSIPAR) 30 MG tablet Take 30 mg by mouth daily with supper.     . cloNIDine (CATAPRES) 0.2 MG tablet Take 1 tablet (0.2 mg total) by mouth 3 (three) times daily. 90 tablet 0  . docusate sodium (COLACE) 100 MG capsule Take 1 capsule (100 mg total) by mouth 2 (two) times daily as needed for mild constipation. 10 capsule 0  . furosemide (LASIX) 80 MG tablet Take 80 mg by mouth 2 (two) times daily.     [name] gabapentin (NEURONTIN) 100 MG capsule TK 1 C PO TID  11  . hydrALAZINE (APRESOLINE) 100 MG tablet Take 1 tablet (100 mg total) by mouth every 6 (six) hours. (Patient not taking: Reported on 08/23/2019) 180 tablet 0  .  HYDROcodone-acetaminophen (NORCO/VICODIN) 5-325 MG tablet Take 1 tablet by mouth at bedtime.    . irbesartan (AVAPRO) 300 MG tablet Take 1 tablet (300 mg total) by mouth at bedtime. 60 tablet 0  . irbesartan (AVAPRO) 300 MG tablet Take 1 tablet (300 mg total) by mouth at bedtime. (Patient not taking: Reported on 08/23/2019) 30 tablet 0  . levofloxacin (LEVAQUIN) 500 MG tablet Take 500 mg by mouth daily.    [name] loratadine (CLARITIN) 10 MG tablet Take 1 tablet (10 mg total) by mouth daily. 30 tablet 0  . multivitamin (RENA-VIT) TABS tablet Take 1 tablet by mouth daily.   11  . nitroGLYCERIN (NITROSTAT) 0.4 MG SL tablet Place 1 tablet (0.4 mg total) under the tongue every 5 (five) minutes as needed for chest pain. (Patient not taking: Reported on 08/23/2019) 10 tablet 0  . ondansetron (ZOFRAN) 4 MG tablet TK 1 T PO BID PRF NAUSEA  1  . sevelamer carbonate (RENVELA) 800 MG tablet Take 2,400 mg by mouth daily.     [name] tiotropium (SPIRIVA) 18 MCG inhalation capsule Place 1 capsule (18 mcg total) into inhaler and inhale daily. (Patient taking differently: Place 18 mcg into inhaler and inhale every evening. ) 30 capsule 12  . zolpidem (AMBIEN) 10 MG tablet Take 10 mg by mouth at bedtime.     No current facility-administered medications for this visit.         Physical Exam There were no vitals taken for this visit. Gen:  WD/WN, NAD Skin: Left arm incision is completely healed.  There is a 2+ radial pulse.  An old fistula is still present but this appears thrombosed no thrill no bruit.  His left upper arm has never had an access.  Catheter is clean dry and intact and nontender     Assessment/Plan: 1. Complication of vascular access for dialysis, subsequent encounter Patient should undergo venograms beginning with the left arm to evaluate for central venous stenosis.  He has had catheters in the past on both sides.  He is right-handed and would prefer access in his left arm.  Vein mapping today of the right arm shows small veins that are not suitable for fistula creation.  Given this finding if a prosthetic is going to be placed then the left upper arm would appear to be the optimal choice.  I will make a final decision after venography.  The risk and benefits were reviewed all questions have been answered patient agrees to proceed with venography and then hopefully left brachial axillary AV graft.      Hortencia Pilar 09/03/2019, 8:22 AM   This note was created with Dragon medical transcription system.  Any errors from dictation are unintentional.

## 2019-09-04 ENCOUNTER — Telehealth (INDEPENDENT_AMBULATORY_CARE_PROVIDER_SITE_OTHER): Payer: Self-pay

## 2019-09-04 NOTE — Telephone Encounter (Signed)
Spoke with the patient and he is now scheduled with Dr. Delana Meyer for bilateral venograms on 09/18/19 with a 10:00 am arrival time to the [facility]. Patient will do covid testing on 09/13/2019 before 12:00 pm at the [facility]. Pre-procedure instructions were discussed and will be faxed to the patient's dialysis center per his request.

## 2019-09-05 DIAGNOSIS — N186 End stage renal disease: Secondary | ICD-10-CM | POA: Diagnosis not present

## 2019-09-05 DIAGNOSIS — E875 Hyperkalemia: Secondary | ICD-10-CM | POA: Diagnosis not present

## 2019-09-05 DIAGNOSIS — D509 Iron deficiency anemia, unspecified: Secondary | ICD-10-CM | POA: Diagnosis not present

## 2019-09-05 DIAGNOSIS — N2581 Secondary hyperparathyroidism of renal origin: Secondary | ICD-10-CM | POA: Diagnosis not present

## 2019-09-05 DIAGNOSIS — D631 Anemia in chronic kidney disease: Secondary | ICD-10-CM | POA: Diagnosis not present

## 2019-09-05 DIAGNOSIS — Z992 Dependence on renal dialysis: Secondary | ICD-10-CM | POA: Diagnosis not present

## 2019-09-08 DIAGNOSIS — N186 End stage renal disease: Secondary | ICD-10-CM | POA: Diagnosis not present

## 2019-09-08 DIAGNOSIS — N2581 Secondary hyperparathyroidism of renal origin: Secondary | ICD-10-CM | POA: Diagnosis not present

## 2019-09-08 DIAGNOSIS — D509 Iron deficiency anemia, unspecified: Secondary | ICD-10-CM | POA: Diagnosis not present

## 2019-09-08 DIAGNOSIS — E875 Hyperkalemia: Secondary | ICD-10-CM | POA: Diagnosis not present

## 2019-09-08 DIAGNOSIS — D631 Anemia in chronic kidney disease: Secondary | ICD-10-CM | POA: Diagnosis not present

## 2019-09-08 DIAGNOSIS — Z992 Dependence on renal dialysis: Secondary | ICD-10-CM | POA: Diagnosis not present

## 2019-09-10 DIAGNOSIS — D509 Iron deficiency anemia, unspecified: Secondary | ICD-10-CM | POA: Diagnosis not present

## 2019-09-10 DIAGNOSIS — E875 Hyperkalemia: Secondary | ICD-10-CM | POA: Diagnosis not present

## 2019-09-10 DIAGNOSIS — N186 End stage renal disease: Secondary | ICD-10-CM | POA: Diagnosis not present

## 2019-09-10 DIAGNOSIS — N2581 Secondary hyperparathyroidism of renal origin: Secondary | ICD-10-CM | POA: Diagnosis not present

## 2019-09-10 DIAGNOSIS — D631 Anemia in chronic kidney disease: Secondary | ICD-10-CM | POA: Diagnosis not present

## 2019-09-10 DIAGNOSIS — Z992 Dependence on renal dialysis: Secondary | ICD-10-CM | POA: Diagnosis not present

## 2019-09-12 DIAGNOSIS — N2581 Secondary hyperparathyroidism of renal origin: Secondary | ICD-10-CM | POA: Diagnosis not present

## 2019-09-12 DIAGNOSIS — E875 Hyperkalemia: Secondary | ICD-10-CM | POA: Diagnosis not present

## 2019-09-12 DIAGNOSIS — N186 End stage renal disease: Secondary | ICD-10-CM | POA: Diagnosis not present

## 2019-09-12 DIAGNOSIS — Z992 Dependence on renal dialysis: Secondary | ICD-10-CM | POA: Diagnosis not present

## 2019-09-12 DIAGNOSIS — D509 Iron deficiency anemia, unspecified: Secondary | ICD-10-CM | POA: Diagnosis not present

## 2019-09-12 DIAGNOSIS — D631 Anemia in chronic kidney disease: Secondary | ICD-10-CM | POA: Diagnosis not present

## 2019-09-13 ENCOUNTER — Other Ambulatory Visit
Admission: RE | Admit: 2019-09-13 | Discharge: 2019-09-13 | Disposition: A | Discharge status: Home / Self Care | Payer: Medicare Other | Source: Ambulatory Visit | Attending: Vascular Surgery | Admitting: Vascular Surgery

## 2019-09-13 ENCOUNTER — Other Ambulatory Visit: Payer: Self-pay

## 2019-09-13 DIAGNOSIS — Z01812 Encounter for preprocedural laboratory examination: Secondary | ICD-10-CM | POA: Insufficient documentation

## 2019-09-13 DIAGNOSIS — Z20828 Contact with and (suspected) exposure to other viral communicable diseases: Secondary | ICD-10-CM | POA: Insufficient documentation

## 2019-09-13 LAB — SARS CORONAVIRUS 2 (TAT 6-24 HRS): SARS Coronavirus 2: NEGATIVE

## 2019-09-14 DIAGNOSIS — I509 Heart failure, unspecified: Secondary | ICD-10-CM | POA: Diagnosis not present

## 2019-09-14 DIAGNOSIS — Z992 Dependence on renal dialysis: Secondary | ICD-10-CM | POA: Diagnosis not present

## 2019-09-14 DIAGNOSIS — N2581 Secondary hyperparathyroidism of renal origin: Secondary | ICD-10-CM | POA: Diagnosis not present

## 2019-09-14 DIAGNOSIS — E8779 Other fluid overload: Secondary | ICD-10-CM | POA: Diagnosis not present

## 2019-09-14 DIAGNOSIS — D631 Anemia in chronic kidney disease: Secondary | ICD-10-CM | POA: Diagnosis not present

## 2019-09-14 DIAGNOSIS — D509 Iron deficiency anemia, unspecified: Secondary | ICD-10-CM | POA: Diagnosis not present

## 2019-09-14 DIAGNOSIS — N186 End stage renal disease: Secondary | ICD-10-CM | POA: Diagnosis not present

## 2019-09-17 ENCOUNTER — Other Ambulatory Visit (INDEPENDENT_AMBULATORY_CARE_PROVIDER_SITE_OTHER): Payer: Self-pay | Admitting: Nurse Practitioner

## 2019-09-17 DIAGNOSIS — E8779 Other fluid overload: Secondary | ICD-10-CM | POA: Diagnosis not present

## 2019-09-17 DIAGNOSIS — D509 Iron deficiency anemia, unspecified: Secondary | ICD-10-CM | POA: Diagnosis not present

## 2019-09-17 DIAGNOSIS — N186 End stage renal disease: Secondary | ICD-10-CM | POA: Diagnosis not present

## 2019-09-17 DIAGNOSIS — N2581 Secondary hyperparathyroidism of renal origin: Secondary | ICD-10-CM | POA: Diagnosis not present

## 2019-09-17 DIAGNOSIS — D631 Anemia in chronic kidney disease: Secondary | ICD-10-CM | POA: Diagnosis not present

## 2019-09-17 DIAGNOSIS — Z992 Dependence on renal dialysis: Secondary | ICD-10-CM | POA: Diagnosis not present

## 2019-09-17 MED ORDER — CEFAZOLIN SODIUM-DEXTROSE 1-4 GM/50ML-% IV SOLN
1.0000 g | Freq: Once | INTRAVENOUS | Status: AC
  Administered 2019-09-18: 12:00:00 1 g via INTRAVENOUS

## 2019-09-18 ENCOUNTER — Ambulatory Visit
Admission: RE | Admit: 2019-09-18 | Discharge: 2019-09-18 | Disposition: A | Discharge status: Home / Self Care | Payer: Medicare Other | Attending: Vascular Surgery | Admitting: Vascular Surgery

## 2019-09-18 ENCOUNTER — Encounter
Admission: RE | Disposition: A | Discharge status: Home / Self Care | Payer: Self-pay | Source: Home / Self Care | Attending: Vascular Surgery

## 2019-09-18 DIAGNOSIS — E785 Hyperlipidemia, unspecified: Secondary | ICD-10-CM | POA: Insufficient documentation

## 2019-09-18 DIAGNOSIS — M069 Rheumatoid arthritis, unspecified: Secondary | ICD-10-CM | POA: Diagnosis not present

## 2019-09-18 DIAGNOSIS — K219 Gastro-esophageal reflux disease without esophagitis: Secondary | ICD-10-CM | POA: Insufficient documentation

## 2019-09-18 DIAGNOSIS — G4730 Sleep apnea, unspecified: Secondary | ICD-10-CM | POA: Insufficient documentation

## 2019-09-18 DIAGNOSIS — Z79899 Other long term (current) drug therapy: Secondary | ICD-10-CM | POA: Insufficient documentation

## 2019-09-18 DIAGNOSIS — I5032 Chronic diastolic (congestive) heart failure: Secondary | ICD-10-CM | POA: Diagnosis not present

## 2019-09-18 DIAGNOSIS — F419 Anxiety disorder, unspecified: Secondary | ICD-10-CM | POA: Diagnosis not present

## 2019-09-18 DIAGNOSIS — T82510D Breakdown (mechanical) of surgically created arteriovenous fistula, subsequent encounter: Secondary | ICD-10-CM | POA: Diagnosis not present

## 2019-09-18 DIAGNOSIS — Y841 Kidney dialysis as the cause of abnormal reaction of the patient, or of later complication, without mention of misadventure at the time of the procedure: Secondary | ICD-10-CM | POA: Insufficient documentation

## 2019-09-18 DIAGNOSIS — J449 Chronic obstructive pulmonary disease, unspecified: Secondary | ICD-10-CM | POA: Diagnosis not present

## 2019-09-18 DIAGNOSIS — Z992 Dependence on renal dialysis: Secondary | ICD-10-CM | POA: Insufficient documentation

## 2019-09-18 DIAGNOSIS — I252 Old myocardial infarction: Secondary | ICD-10-CM | POA: Diagnosis not present

## 2019-09-18 DIAGNOSIS — Z7982 Long term (current) use of aspirin: Secondary | ICD-10-CM | POA: Insufficient documentation

## 2019-09-18 DIAGNOSIS — N186 End stage renal disease: Secondary | ICD-10-CM | POA: Insufficient documentation

## 2019-09-18 DIAGNOSIS — I2510 Atherosclerotic heart disease of native coronary artery without angina pectoris: Secondary | ICD-10-CM | POA: Insufficient documentation

## 2019-09-18 DIAGNOSIS — I132 Hypertensive heart and chronic kidney disease with heart failure and with stage 5 chronic kidney disease, or end stage renal disease: Secondary | ICD-10-CM | POA: Insufficient documentation

## 2019-09-18 DIAGNOSIS — T82898A Other specified complication of vascular prosthetic devices, implants and grafts, initial encounter: Secondary | ICD-10-CM

## 2019-09-18 DIAGNOSIS — I480 Paroxysmal atrial fibrillation: Secondary | ICD-10-CM | POA: Insufficient documentation

## 2019-09-18 DIAGNOSIS — Z7901 Long term (current) use of anticoagulants: Secondary | ICD-10-CM | POA: Insufficient documentation

## 2019-09-18 HISTORY — PX: UPPER EXTREMITY VENOGRAPHY: CATH118272

## 2019-09-18 LAB — POTASSIUM (ARMC VASCULAR LAB ONLY): Potassium (ARMC vascular lab): 5 (ref 3.5–5.1)

## 2019-09-18 SURGERY — UPPER EXTREMITY VENOGRAPHY
Anesthesia: Moderate Sedation | Laterality: Bilateral

## 2019-09-18 MED ORDER — SODIUM CHLORIDE 0.9 % IV SOLN: INTRAVENOUS | Status: DC

## 2019-09-18 MED ORDER — HYDROMORPHONE HCL 1 MG/ML IJ SOLN: 1.0000 mg | Freq: Once | INTRAMUSCULAR | Status: DC | PRN

## 2019-09-18 MED ORDER — HEPARIN SODIUM (PORCINE) 1000 UNIT/ML IJ SOLN
INTRAMUSCULAR | Status: DC | PRN
  Administered 2019-09-18: 4000 [IU] via INTRAVENOUS

## 2019-09-18 MED ORDER — MIDAZOLAM HCL 2 MG/2ML IJ SOLN
INTRAMUSCULAR | Status: DC | PRN
  Administered 2019-09-18: 0.5 mg via INTRAVENOUS
  Administered 2019-09-18 (×2): 1 mg via INTRAVENOUS

## 2019-09-18 MED ORDER — ONDANSETRON HCL 4 MG/2ML IJ SOLN: 4.0000 mg | Freq: Four times a day (QID) | INTRAMUSCULAR | Status: DC | PRN

## 2019-09-18 MED ORDER — DIPHENHYDRAMINE HCL 50 MG/ML IJ SOLN: 50.0000 mg | Freq: Once | INTRAMUSCULAR | Status: DC | PRN

## 2019-09-18 MED ORDER — METHYLPREDNISOLONE SODIUM SUCC 125 MG IJ SOLR: 125.0000 mg | Freq: Once | INTRAMUSCULAR | Status: DC | PRN

## 2019-09-18 MED ORDER — MIDAZOLAM HCL 2 MG/ML PO SYRP: 8.0000 mg | ORAL_SOLUTION | Freq: Once | ORAL | Status: DC | PRN

## 2019-09-18 MED ORDER — IODIXANOL 320 MG/ML IV SOLN
INTRAVENOUS | Status: DC | PRN
  Administered 2019-09-18: 40 mL via INTRAVENOUS

## 2019-09-18 MED ORDER — FENTANYL CITRATE (PF) 100 MCG/2ML IJ SOLN
INTRAMUSCULAR | Status: AC
  Filled 2019-09-18: qty 2

## 2019-09-18 MED ORDER — MIDAZOLAM HCL 5 MG/5ML IJ SOLN
INTRAMUSCULAR | Status: AC
  Filled 2019-09-18: qty 5

## 2019-09-18 MED ORDER — FENTANYL CITRATE (PF) 100 MCG/2ML IJ SOLN
INTRAMUSCULAR | Status: DC | PRN
  Administered 2019-09-18 (×3): 25 ug via INTRAVENOUS

## 2019-09-18 MED ORDER — HEPARIN SODIUM (PORCINE) 1000 UNIT/ML IJ SOLN
INTRAMUSCULAR | Status: AC
  Filled 2019-09-18: qty 1

## 2019-09-18 MED ORDER — CEFAZOLIN SODIUM-DEXTROSE 1-4 GM/50ML-% IV SOLN
INTRAVENOUS | Status: AC
  Filled 2019-09-18: qty 50

## 2019-09-18 MED ORDER — FAMOTIDINE 20 MG PO TABS: 40.0000 mg | ORAL_TABLET | Freq: Once | ORAL | Status: DC | PRN

## 2019-09-18 SURGICAL SUPPLY — 18 items
BALLN ATG 12X4X80 (BALLOONS) ×3
BALLN LUTONIX AV 10X40X75 (BALLOONS) ×3
BALLOON ATG 12X4X80 (BALLOONS) ×1 IMPLANT
BALLOON LUTONIX AV 10X40X75 (BALLOONS) ×1 IMPLANT
CATH BEACON 5 .035 65 KMP TIP (CATHETERS) ×3 IMPLANT
CATH VS15FR (CATHETERS) ×3 IMPLANT
DEVICE PRESTO INFLATION (MISCELLANEOUS) ×3 IMPLANT
DRAPE BRACHIAL (DRAPES) ×3 IMPLANT
GLIDEWIRE STIFF .35X180X3 HYDR (WIRE) ×3 IMPLANT
GUIDEWIRE ANGLED .035 180CM (WIRE) ×3 IMPLANT
NEEDLE ENTRY 21GA 7CM ECHOTIP (NEEDLE) ×3 IMPLANT
PACK ANGIOGRAPHY (CUSTOM PROCEDURE TRAY) ×3 IMPLANT
SET INTRO CAPELLA COAXIAL (SET/KITS/TRAYS/PACK) ×3 IMPLANT
SHEATH BRITE TIP 5FRX11 (SHEATH) ×3 IMPLANT
SHEATH BRITE TIP 7FRX5.5 (SHEATH) ×3 IMPLANT
STENT VENOVO 14X40X80 (Permanent Stent) ×3 IMPLANT
SUT MNCRL AB 4-0 PS2 18 (SUTURE) ×3 IMPLANT
WIRE MAGIC TORQUE 260C (WIRE) ×3 IMPLANT

## 2019-09-18 NOTE — Op Note (Addendum)
OPERATIVE NOTE   PROCEDURE: 1. Contrast injection left arm brachial vein 2. Percutaneous transluminal angioplasty and stent placement left subclavian vein 3. Introduction catheter into left internal jugular first-order catheter placement  PRE-OPERATIVE DIAGNOSIS: Complication of dialysis access                                                       End Stage Renal Disease  POST-OPERATIVE DIAGNOSIS: same as above   SURGEON: Katha Cabal, M.D.  ANESTHESIA: Conscious sedation was administered under my direct supervision by the interventional radiology RN. IV Versed plus fentanyl were utilized. Continuous ECG, pulse oximetry and blood pressure was monitored throughout the entire procedure.  Conscious sedation was for a total of 35 minutes.  ESTIMATED BLOOD LOSS: minimal  FINDING(S): 1. Greater than 70% central venous stenosis left subclavian vein  SPECIMEN(S):  None  CONTRAST: 40 cc  FLUOROSCOPY TIME: 9.1 minutes  INDICATIONS: Daryl Weeks is a 59 y.o. male who  presents with malfunctioning left AV access.  The patient is scheduled for angiography with possible intervention to determine if a new access can be created in the left arm.   The patient is aware the risks include but are not limited to: bleeding, infection, thrombosis of the cannulated access, and possible anaphylactic reaction to the contrast.  The patient acknowledges if the access can not be salvaged a tunneled catheter will be needed and will be placed during this procedure.  The patient is aware of the risks of the procedure and elects to proceed with the angiogram and intervention.  DESCRIPTION: After full informed written consent was obtained, the patient was brought back to the Special Procedure suite and placed supine position.  Appropriate cardiopulmonary monitors were placed.  The left arm was prepped and draped in the standard fashion.  Appropriate timeout is called.   The left brachial vein was cannulated  with a micropuncture needle.  Cannulation was performed with ultrasound guidance. Ultrasound was placed in a sterile sleeve, the AV access was interrogated and noted to be echolucent and compressible indicating patency. Image was recorded for the permanent record. The puncture is performed under continuous ultrasound visualization.   The microwire was advanced and the needle was exchanged for  a microsheath.  The J-wire was then advanced and ultimately a 7 Fr sheath inserted.  Hand injections were completed to image the access from the level of the antecubital fossa to the heart.  The central venous structures were also imaged by hand injections.  Images of the subclavian vein near its confluence with the internal jugular and innominate vein were unclear and a 5 French sheath was inserted and a Kumpe with a floppy Glidewire was negotiated to this level.  Hand-injection demonstrated high-grade stenosis.  Attempts at selecting both the cephalic vein as well as the internal jugular vein with the Kumpe were not successful in a VS 1 catheter was introduced reformed in the innominate vein and then the internal jugular on the left was selected with the V S1 catheter.  Hand-injection of contrast was performed.  The catheter was then negotiated into the cephalic vein.  Hand-injection of contrast here demonstrated wide patency of the proximal cephalic vein as well as the cephalic confluence.  Diagnostic interpretation: The visualized segments of the brachial vein as well as the portion of the basilic  vein the axillary vein and the distal subclavian vein are patent and there are no hemodynamically significant stenoses identified.  The central venous anatomy demonstrates a greater than 70% stenosis within the left subclavian vein at the confluence of the left innominate.  With a catheter negotiated into the internal jugular visualization of the jugular vein demonstrates it occludes at the level of the jaw and there are  extensive collaterals noted.  Wide patency of the proximal cephalic vein and the cephalic confluence was also confirmed.  The remaining portions of the central venous anatomy are widely patent.  Given these findings placement of a hero graft via a jugular approach would be very difficult as the remaining patent segment of the jugular is very proximal.  The cephalic vein being widely patent throughout its course favors stenting of the lesion in the proximal subclavian and then creation of a brachiocephalic fistula.  Based on the images,  4000 units of heparin was given and the 5 [place] sheath was upsized to a 7 [place] sheath.  A wire was negotiated through the central strictures.  An 10 mm x 40 mm Lutonix balloon was then used to predilate the stricture.  Inflation was to 10 atmospheres for 1 minute.  Repeat imaging demonstrated greater than 50% residual stenosis but improvement sufficient to allow for stenting.  A 14 mm x 40 mm Venovo stent was then opened onto the field and deployed across the lesion without difficulty.  It was initially dilated with a 12 mm x 40 mm [place] balloon with an inflation to 10 atm for 1 minute.    Follow-up imaging demonstrates complete resolution of the stricture, less than 15% residual stenosis, with rapid flow of contrast through the central venous anatomy.  A 4-0 Monocryl purse-string suture was sewn around the sheath.  The sheath was removed and light pressure was applied.  A sterile bandage was applied to the puncture site.    COMPLICATIONS: None  CONDITION: Daryl Weeks, M.D [facility] Office: [phone]  09/18/2019 1:07 PM

## 2019-09-18 NOTE — H&P (Signed)
 [facility] History & Physical Update  The patient was interviewed and re-examined.  The patient's previous History and Physical has been reviewed and is unchanged.  There is no change in the plan of care. We plan to proceed with the scheduled procedure.  Hortencia Pilar, MD  09/18/2019, 1:02 PM

## 2019-09-18 NOTE — Discharge Instructions (Signed)
Venogram, Care After °This sheet gives you information about how to care for yourself after your procedure. Your health care provider may also give you more specific instructions. If you have problems or questions, contact your health care provider. °What can I expect after the procedure? °After the procedure, it is common to have: °· Bruising or mild discomfort in the area where the IV was inserted (insertion site). °Follow these instructions at home: °Eating and drinking ° °· Follow instructions from your health care provider about eating or drinking restrictions. °· Drink a lot of fluids for the first several days after the procedure, as directed by your health care provider. This helps to wash (flush) the contrast out of your body. Examples of healthy fluids include water or low-calorie drinks. °General instructions °· Check your IV insertion area every day for signs of infection. Check for: °? Redness, swelling, or pain. °? Fluid or blood. °? Warmth. °? Pus or a bad smell. °· Take over-the-counter and prescription medicines only as told by your health care provider. °· Rest and return to your normal activities as told by your health care provider. Ask your health care provider what activities are safe for you. °· Do not drive for 24 hours if you were given a medicine to help you relax (sedative), or until your health care provider approves. °· Keep all follow-up visits as told by your health care provider. This is important. °Contact a health care provider if: °· Your skin becomes itchy or you develop a rash or hives. °· You have a fever that does not get better with medicine. °· You feel nauseous. °· You vomit. °· You have redness, swelling, or pain around the insertion site. °· You have fluid or blood coming from the insertion site. °· Your insertion area feels warm to the touch. °· You have pus or a bad smell coming from the insertion site. °Get help right away if: °· You have difficulty breathing or  shortness of breath. °· You develop chest pain. °· You faint. °· You feel very dizzy. °These symptoms may represent a serious problem that is an emergency. Do not wait to see if the symptoms will go away. Get medical help right away. Call your local emergency services (911 in the [place] Do not drive yourself to the hospital. °Summary °· After your procedure, it is common to have bruising or mild discomfort in the area where the IV was inserted. °· You should check your IV insertion area every day for signs of infection. °· Take over-the-counter and prescription medicines only as told by your health care provider. °· You should drink a lot of fluids for the first several days after the procedure to help flush the contrast from your body. °This information is not intended to replace advice given to you by your health care provider. Make sure you discuss any questions you have with your health care provider. °Document Released: 06/20/2013 Document Revised: 08/12/2017 Document Reviewed: 07/24/2016 °Elsevier Patient Education © 2020 [employer] ° °

## 2019-09-19 ENCOUNTER — Encounter: Payer: Self-pay | Admitting: Cardiology

## 2019-09-19 DIAGNOSIS — Z992 Dependence on renal dialysis: Secondary | ICD-10-CM | POA: Diagnosis not present

## 2019-09-19 DIAGNOSIS — N2581 Secondary hyperparathyroidism of renal origin: Secondary | ICD-10-CM | POA: Diagnosis not present

## 2019-09-19 DIAGNOSIS — N186 End stage renal disease: Secondary | ICD-10-CM | POA: Diagnosis not present

## 2019-09-19 DIAGNOSIS — D509 Iron deficiency anemia, unspecified: Secondary | ICD-10-CM | POA: Diagnosis not present

## 2019-09-19 DIAGNOSIS — E8779 Other fluid overload: Secondary | ICD-10-CM | POA: Diagnosis not present

## 2019-09-19 DIAGNOSIS — D631 Anemia in chronic kidney disease: Secondary | ICD-10-CM | POA: Diagnosis not present

## 2019-09-21 DIAGNOSIS — N2581 Secondary hyperparathyroidism of renal origin: Secondary | ICD-10-CM | POA: Diagnosis not present

## 2019-09-21 DIAGNOSIS — Z992 Dependence on renal dialysis: Secondary | ICD-10-CM | POA: Diagnosis not present

## 2019-09-21 DIAGNOSIS — N186 End stage renal disease: Secondary | ICD-10-CM | POA: Diagnosis not present

## 2019-09-21 DIAGNOSIS — E8779 Other fluid overload: Secondary | ICD-10-CM | POA: Diagnosis not present

## 2019-09-21 DIAGNOSIS — D631 Anemia in chronic kidney disease: Secondary | ICD-10-CM | POA: Diagnosis not present

## 2019-09-21 DIAGNOSIS — D509 Iron deficiency anemia, unspecified: Secondary | ICD-10-CM | POA: Diagnosis not present

## 2019-09-24 ENCOUNTER — Observation Stay
Admission: EM | Admit: 2019-09-24 | Discharge: 2019-09-25 | Disposition: A | Discharge status: Home / Self Care | Payer: Medicare Other | Attending: Internal Medicine | Admitting: Internal Medicine

## 2019-09-24 ENCOUNTER — Emergency Department: Payer: Medicare Other

## 2019-09-24 ENCOUNTER — Other Ambulatory Visit: Payer: Self-pay

## 2019-09-24 ENCOUNTER — Encounter: Payer: Self-pay | Admitting: Radiology

## 2019-09-24 DIAGNOSIS — N186 End stage renal disease: Secondary | ICD-10-CM

## 2019-09-24 DIAGNOSIS — F17210 Nicotine dependence, cigarettes, uncomplicated: Secondary | ICD-10-CM | POA: Insufficient documentation

## 2019-09-24 DIAGNOSIS — T17920A Food in respiratory tract, part unspecified causing asphyxiation, initial encounter: Secondary | ICD-10-CM | POA: Diagnosis not present

## 2019-09-24 DIAGNOSIS — G4730 Sleep apnea, unspecified: Secondary | ICD-10-CM | POA: Diagnosis not present

## 2019-09-24 DIAGNOSIS — Z20822 Contact with and (suspected) exposure to covid-19: Secondary | ICD-10-CM | POA: Diagnosis not present

## 2019-09-24 DIAGNOSIS — D631 Anemia in chronic kidney disease: Secondary | ICD-10-CM | POA: Insufficient documentation

## 2019-09-24 DIAGNOSIS — Z992 Dependence on renal dialysis: Secondary | ICD-10-CM | POA: Insufficient documentation

## 2019-09-24 DIAGNOSIS — Z7901 Long term (current) use of anticoagulants: Secondary | ICD-10-CM | POA: Diagnosis not present

## 2019-09-24 DIAGNOSIS — K219 Gastro-esophageal reflux disease without esophagitis: Secondary | ICD-10-CM | POA: Insufficient documentation

## 2019-09-24 DIAGNOSIS — I5032 Chronic diastolic (congestive) heart failure: Secondary | ICD-10-CM | POA: Insufficient documentation

## 2019-09-24 DIAGNOSIS — Z7982 Long term (current) use of aspirin: Secondary | ICD-10-CM | POA: Insufficient documentation

## 2019-09-24 DIAGNOSIS — I2510 Atherosclerotic heart disease of native coronary artery without angina pectoris: Secondary | ICD-10-CM | POA: Diagnosis not present

## 2019-09-24 DIAGNOSIS — J440 Chronic obstructive pulmonary disease with acute lower respiratory infection: Secondary | ICD-10-CM | POA: Insufficient documentation

## 2019-09-24 DIAGNOSIS — Z79899 Other long term (current) drug therapy: Secondary | ICD-10-CM | POA: Insufficient documentation

## 2019-09-24 DIAGNOSIS — J189 Pneumonia, unspecified organism: Secondary | ICD-10-CM | POA: Diagnosis not present

## 2019-09-24 DIAGNOSIS — J441 Chronic obstructive pulmonary disease with (acute) exacerbation: Secondary | ICD-10-CM | POA: Diagnosis present

## 2019-09-24 DIAGNOSIS — I482 Chronic atrial fibrillation, unspecified: Secondary | ICD-10-CM | POA: Insufficient documentation

## 2019-09-24 DIAGNOSIS — E875 Hyperkalemia: Secondary | ICD-10-CM | POA: Insufficient documentation

## 2019-09-24 DIAGNOSIS — N2581 Secondary hyperparathyroidism of renal origin: Secondary | ICD-10-CM | POA: Diagnosis not present

## 2019-09-24 DIAGNOSIS — E785 Hyperlipidemia, unspecified: Secondary | ICD-10-CM | POA: Insufficient documentation

## 2019-09-24 DIAGNOSIS — R042 Hemoptysis: Secondary | ICD-10-CM | POA: Diagnosis not present

## 2019-09-24 DIAGNOSIS — I252 Old myocardial infarction: Secondary | ICD-10-CM | POA: Diagnosis not present

## 2019-09-24 DIAGNOSIS — R918 Other nonspecific abnormal finding of lung field: Secondary | ICD-10-CM | POA: Diagnosis not present

## 2019-09-24 DIAGNOSIS — Z7951 Long term (current) use of inhaled steroids: Secondary | ICD-10-CM | POA: Diagnosis not present

## 2019-09-24 DIAGNOSIS — I10 Essential (primary) hypertension: Secondary | ICD-10-CM | POA: Diagnosis not present

## 2019-09-24 DIAGNOSIS — I132 Hypertensive heart and chronic kidney disease with heart failure and with stage 5 chronic kidney disease, or end stage renal disease: Secondary | ICD-10-CM | POA: Diagnosis not present

## 2019-09-24 DIAGNOSIS — Z951 Presence of aortocoronary bypass graft: Secondary | ICD-10-CM | POA: Insufficient documentation

## 2019-09-24 DIAGNOSIS — R58 Hemorrhage, not elsewhere classified: Secondary | ICD-10-CM | POA: Diagnosis not present

## 2019-09-24 LAB — TYPE AND SCREEN
ABO/RH(D): A POS
Antibody Screen: NEGATIVE

## 2019-09-24 LAB — CBC WITH DIFFERENTIAL/PLATELET
Abs Immature Granulocytes: 0.04 10*3/uL (ref 0.00–0.07)
Basophils Absolute: 0.1 10*3/uL (ref 0.0–0.1)
Basophils Relative: 1 %
Eosinophils Absolute: 0.4 10*3/uL (ref 0.0–0.5)
Eosinophils Relative: 5 %
HCT: 36.8 % — ABNORMAL LOW (ref 39.0–52.0)
Hemoglobin: 12.7 g/dL — ABNORMAL LOW (ref 13.0–17.0)
Immature Granulocytes: 1 %
Lymphocytes Relative: 12 %
Lymphs Abs: 1 10*3/uL (ref 0.7–4.0)
MCH: 34.1 pg — ABNORMAL HIGH (ref 26.0–34.0)
MCHC: 34.5 g/dL (ref 30.0–36.0)
MCV: 98.9 fL (ref 80.0–100.0)
Monocytes Absolute: 1.7 10*3/uL — ABNORMAL HIGH (ref 0.1–1.0)
Monocytes Relative: 20 %
Neutro Abs: 5.2 10*3/uL (ref 1.7–7.7)
Neutrophils Relative %: 61 %
Platelets: 180 10*3/uL (ref 150–400)
RBC: 3.72 MIL/uL — ABNORMAL LOW (ref 4.22–5.81)
RDW: 13.5 % (ref 11.5–15.5)
WBC: 8.4 10*3/uL (ref 4.0–10.5)
nRBC: 0 % (ref 0.0–0.2)

## 2019-09-24 LAB — LACTIC ACID, PLASMA
Lactic Acid, Venous: 0.5 mmol/L (ref 0.5–1.9)
Lactic Acid, Venous: 0.7 mmol/L (ref 0.5–1.9)

## 2019-09-24 LAB — EXPECTORATED SPUTUM ASSESSMENT W GRAM STAIN, RFLX TO RESP C

## 2019-09-24 LAB — TROPONIN I (HIGH SENSITIVITY)
Troponin I (High Sensitivity): 42 ng/L — ABNORMAL HIGH (ref <18)
Troponin I (High Sensitivity): 37 ng/L — ABNORMAL HIGH (ref <18)

## 2019-09-24 LAB — COMPREHENSIVE METABOLIC PANEL
ALT: 8 U/L (ref 0–44)
AST: 17 U/L (ref 15–41)
Albumin: 3.3 g/dL — ABNORMAL LOW (ref 3.5–5.0)
Alkaline Phosphatase: 82 U/L (ref 38–126)
Anion gap: 15 (ref 5–15)
BUN: 57 mg/dL — ABNORMAL HIGH (ref 6–20)
CO2: 21 mmol/L — ABNORMAL LOW (ref 22–32)
Calcium: 10.2 mg/dL (ref 8.9–10.3)
Chloride: 93 mmol/L — ABNORMAL LOW (ref 98–111)
Creatinine, Ser: 10.66 mg/dL — ABNORMAL HIGH (ref 0.61–1.24)
GFR calc Af Amer: 6 mL/min — ABNORMAL LOW (ref >60)
GFR calc non Af Amer: 5 mL/min — ABNORMAL LOW (ref >60)
Glucose, Bld: 77 mg/dL (ref 70–99)
Potassium: 5.9 mmol/L — ABNORMAL HIGH (ref 3.5–5.1)
Sodium: 129 mmol/L — ABNORMAL LOW (ref 135–145)
Total Bilirubin: 0.8 mg/dL (ref 0.3–1.2)
Total Protein: 6.9 g/dL (ref 6.5–8.1)

## 2019-09-24 LAB — PROTIME-INR
INR: 1.2 (ref 0.8–1.2)
Prothrombin Time: 15.3 seconds — ABNORMAL HIGH (ref 11.4–15.2)

## 2019-09-24 LAB — HIV ANTIBODY (ROUTINE TESTING W REFLEX): HIV Screen 4th Generation wRfx: NONREACTIVE

## 2019-09-24 LAB — RESPIRATORY PANEL BY RT PCR (FLU A&B, COVID)
Influenza A by PCR: NEGATIVE
Influenza B by PCR: NEGATIVE
SARS Coronavirus 2 by RT PCR: NEGATIVE

## 2019-09-24 LAB — ETHANOL: Alcohol, Ethyl (B): <10 mg/dL (ref <10)

## 2019-09-24 MED ORDER — ONDANSETRON 4 MG PO TBDP
4.0000 mg | ORAL_TABLET | Freq: Three times a day (TID) | ORAL | Status: DC | PRN
  Administered 2019-09-24 – 2019-09-25 (×2): 4 mg via ORAL
  Filled 2019-09-24 (×2): qty 1

## 2019-09-24 MED ORDER — SODIUM CHLORIDE 0.9 % IV BOLUS
1000.0000 mL | Freq: Once | INTRAVENOUS | Status: AC
  Administered 2019-09-24: 04:00:00 1000 mL via INTRAVENOUS

## 2019-09-24 MED ORDER — SEVELAMER CARBONATE 800 MG PO TABS
2400.0000 mg | ORAL_TABLET | Freq: Three times a day (TID) | ORAL | Status: DC
  Administered 2019-09-24 – 2019-09-25 (×3): 2400 mg via ORAL
  Filled 2019-09-24 (×5): qty 3

## 2019-09-24 MED ORDER — SODIUM CHLORIDE 0.9 % IV SOLN
1.0000 g | Freq: Once | INTRAVENOUS | Status: DC
  Filled 2019-09-24: qty 10

## 2019-09-24 MED ORDER — TRANEXAMIC ACID 1000 MG/10ML IV SOLN
500.0000 mg | Freq: Once | INTRAVENOUS | Status: AC
  Administered 2019-09-24: 500 mg via TOPICAL
  Filled 2019-09-24: qty 10

## 2019-09-24 MED ORDER — THIAMINE HCL 100 MG/ML IJ SOLN
100.0000 mg | Freq: Every day | INTRAMUSCULAR | Status: DC
  Administered 2019-09-25: 100 mg via INTRAVENOUS
  Filled 2019-09-24: qty 2

## 2019-09-24 MED ORDER — LORAZEPAM 2 MG/ML IJ SOLN: 0.0000 mg | Freq: Four times a day (QID) | INTRAMUSCULAR | Status: DC

## 2019-09-24 MED ORDER — CHLORHEXIDINE GLUCONATE CLOTH 2 % EX PADS
6.0000 | MEDICATED_PAD | Freq: Every day | CUTANEOUS | Status: DC
  Administered 2019-09-25: 6 via TOPICAL
  Filled 2019-09-24 (×2): qty 6

## 2019-09-24 MED ORDER — ZOLPIDEM TARTRATE 5 MG PO TABS
5.0000 mg | ORAL_TABLET | Freq: Every evening | ORAL | Status: DC | PRN
  Administered 2019-09-25: 5 mg via ORAL
  Filled 2019-09-24: qty 1

## 2019-09-24 MED ORDER — SODIUM CHLORIDE 0.9 % IV SOLN
2.0000 g | INTRAVENOUS | Status: DC
  Administered 2019-09-24 – 2019-09-25 (×2): 2 g via INTRAVENOUS
  Filled 2019-09-24: qty 20
  Filled 2019-09-24: qty 2

## 2019-09-24 MED ORDER — CLONIDINE HCL 0.1 MG PO TABS
0.2000 mg | ORAL_TABLET | Freq: Two times a day (BID) | ORAL | Status: DC
  Administered 2019-09-24 – 2019-09-25 (×2): 0.2 mg via ORAL
  Filled 2019-09-24 (×2): qty 2

## 2019-09-24 MED ORDER — FERRIC CITRATE 1 GM 210 MG(FE) PO TABS
420.0000 mg | ORAL_TABLET | Freq: Three times a day (TID) | ORAL | Status: DC
  Administered 2019-09-24 – 2019-09-25 (×2): 420 mg via ORAL
  Filled 2019-09-24 (×6): qty 2

## 2019-09-24 MED ORDER — IOHEXOL 350 MG/ML SOLN
75.0000 mL | Freq: Once | INTRAVENOUS | Status: AC | PRN
  Administered 2019-09-24: 75 mL via INTRAVENOUS

## 2019-09-24 MED ORDER — CINACALCET HCL 30 MG PO TABS
60.0000 mg | ORAL_TABLET | ORAL | Status: DC
  Administered 2019-09-24: 17:00:00 60 mg via ORAL
  Filled 2019-09-24: qty 2

## 2019-09-24 MED ORDER — SODIUM CHLORIDE 0.9 % IV SOLN
500.0000 mg | Freq: Once | INTRAVENOUS | Status: DC
  Filled 2019-09-24: qty 500

## 2019-09-24 MED ORDER — SODIUM CHLORIDE 0.9 % IV SOLN
500.0000 mg | INTRAVENOUS | Status: DC
  Administered 2019-09-24 – 2019-09-25 (×2): 500 mg via INTRAVENOUS
  Filled 2019-09-24 (×2): qty 500

## 2019-09-24 NOTE — Progress Notes (Signed)
This note also relates to the following rows which could not be included: Pulse Rate - Cannot attach notes to unvalidated device data Resp - Cannot attach notes to unvalidated device data SpO2 - Cannot attach notes to unvalidated device data  Hd started  

## 2019-09-24 NOTE — ED Notes (Signed)
Report given to Ashley RN

## 2019-09-24 NOTE — Progress Notes (Signed)
[facility]  ROUNDING NOTE   Subjective:   [name] admitted to [facility] on 09/24/2019 for Hyperkalemia [E87.5] Pneumonia [J18.9] Hemoptysis [R04.2] Community acquired pneumonia, unspecified laterality [J18.9]  Place on hemodialysis treatment. Tolerated treatment well.   Objective:  Vital signs in last 24 hours:  Temp:  [97.2 F (36.2 C)-97.8 F (36.6 C)] 97.8 F (36.6 C) (01/11 1447) Pulse Rate:  [64-80] 76 (01/11 1448) Resp:  [12-18] 16 (01/11 1447) BP: (118-171)/(61-100) 170/96 (01/11 1448) SpO2:  [93 %-98 %] 96 % (01/11 1447) Weight:  [72.6 kg] 72.6 kg (01/11 0407)  Weight change:  Filed Weights   09/24/19 0407  Weight: 72.6 kg    Intake/Output: No intake/output data recorded.   Intake/Output this shift:  Total I/O In: 250 [IV Piggyback:250] Out: 2000 [Other:2000]  Physical Exam: General: NAD,   Head: Normocephalic, atraumatic. Moist oral mucosal membranes  Eyes: Anicteric, PERRL  Neck: Supple, trachea midline  Lungs:  Clear to auscultation  Heart: Regular rate and rhythm  Abdomen:  Soft, nontender,   Extremities:  no peripheral edema.  Neurologic: Nonfocal, moving all four extremities  Skin: No lesions  Access: RIJ permcath    Basic Metabolic Panel: Recent Labs  Lab 09/24/19 0355  NA 129*  K 5.9*  CL 93*  CO2 21*  GLUCOSE 77  BUN 57*  CREATININE 10.66*  CALCIUM 10.2    Liver Function Tests: Recent Labs  Lab 09/24/19 0355  AST 17  ALT 8  ALKPHOS 82  BILITOT 0.8  PROT 6.9  ALBUMIN 3.3*   No results for input(s): LIPASE, AMYLASE in the last 168 hours. No results for input(s): AMMONIA in the last 168 hours.  CBC: Recent Labs  Lab 09/24/19 0355  WBC 8.4  NEUTROABS 5.2  HGB 12.7*  HCT 36.8*  MCV 98.9  PLT 180    Cardiac Enzymes: No results for input(s): CKTOTAL, CKMB, CKMBINDEX, TROPONINI in the last 168 hours.  BNP: Invalid input(s): POCBNP  CBG: No results for input(s): GLUCAP in the last 168  hours.  Microbiology: Results for orders placed or performed during the hospital encounter of 09/24/19  Culture, blood (routine x 2)     Status: None (Preliminary result)   Collection Time: 09/24/19  4:00 AM   Specimen: BLOOD  Result Value Ref Range Status   Specimen Description BLOOD LEFT  Final   Special Requests   Final    BOTTLES DRAWN AEROBIC AND ANAEROBIC Blood Culture adequate volume   Culture   Final    NO GROWTH <12 HOURS Performed at [facility], [address], [place], [place] [address]    Report Status PENDING  Incomplete  Culture, blood (routine x 2)     Status: None (Preliminary result)   Collection Time: 09/24/19  4:00 AM   Specimen: BLOOD  Result Value Ref Range Status   Specimen Description BLOOD RIGHT FA  Final   Special Requests   Final    BOTTLES DRAWN AEROBIC AND ANAEROBIC Blood Culture adequate volume   Culture   Final    NO GROWTH <12 HOURS Performed at [facility], [address], [place],  [address]    Report Status PENDING  Incomplete  Respiratory Panel by RT PCR (Flu A&B, Covid) - Nasopharyngeal Swab     Status: None   Collection Time: 09/24/19  4:04 AM   Specimen: Nasopharyngeal Swab  Result Value Ref Range Status   SARS Coronavirus 2 by RT PCR NEGATIVE NEGATIVE Final  Comment: (NOTE) SARS-CoV-2 target nucleic acids are NOT DETECTED. The SARS-CoV-2 RNA is generally detectable in upper respiratoy specimens during the acute phase of infection. The lowest concentration of SARS-CoV-2 viral copies this assay can detect is 131 copies/mL. A negative result does not preclude SARS-Cov-2 infection and should not be used as the sole basis for treatment or other patient management decisions. A negative result may occur with  improper specimen collection/handling, submission of specimen other than nasopharyngeal swab, presence of viral mutation(s) within the areas targeted by this assay, and inadequate number of viral  copies (<131 copies/mL). A negative result must be combined with clinical observations, patient history, and epidemiological information. The expected result is Negative. Fact Sheet for Patients:  PinkCheek.be Fact Sheet for Healthcare Providers:  GravelBags.it This test is not yet ap proved or cleared by the [place] FDA and  has been authorized for detection and/or diagnosis of SARS-CoV-2 by FDA under an Emergency Use Authorization (EUA). This EUA will remain  in effect (meaning this test can be used) for the duration of the COVID-19 declaration under Section 564(b)(1) of the Act, 21 U.S.C. section 360bbb-3(b)(1), unless the authorization is terminated or revoked sooner.    Influenza A by PCR NEGATIVE NEGATIVE Final   Influenza B by PCR NEGATIVE NEGATIVE Final    Comment: (NOTE) The Xpert Xpress SARS-CoV-2/FLU/RSV assay is intended as an aid in  the diagnosis of influenza from Nasopharyngeal swab specimens and  should not be used as a sole basis for treatment. Nasal washings and  aspirates are unacceptable for Xpert Xpress SARS-CoV-2/FLU/RSV  testing. Fact Sheet for Patients: PinkCheek.be Fact Sheet for Healthcare Providers: GravelBags.it This test is not yet approved or cleared by the [place] FDA and  has been authorized for detection and/or diagnosis of SARS-CoV-2 by  FDA under an Emergency Use Authorization (EUA). This EUA will remain  in effect (meaning this test can be used) for the duration of the  Covid-19 declaration under Section 564(b)(1) of the Act, 21  U.S.C. section 360bbb-3(b)(1), unless the authorization is  terminated or revoked. Performed at [facility], [address] [place], [place] [address]   Sputum culture     Status: None   Collection Time: 09/24/19  4:04 AM   Specimen: Expectorated Sputum  Result Value Ref Range  Status   Specimen Description EXPECTORATED SPUTUM  Final   Special Requests NONE  Final   Sputum evaluation   Final    THIS SPECIMEN IS ACCEPTABLE FOR SPUTUM CULTURE Performed at [facility], [address], [place], [place] [address]    Report Status 09/24/2019 FINAL  Final  Culture, respiratory     Status: None (Preliminary result)   Collection Time: 09/24/19  4:04 AM  Result Value Ref Range Status   Specimen Description   Final    EXPECTORATED SPUTUM Performed at [facility], [address], [place], [place] [address]    Special Requests   Final    NONE Reflexed from [id number] Performed at [facility], [address] [place], [place] [address]    Gram Stain   Final    NO WBC SEEN NO ORGANISMS SEEN Performed at [facility], [place] [address], [place], [place] [address]    Culture PENDING  Incomplete   Report Status PENDING  Incomplete    Coagulation Studies: Recent Labs    09/24/19 0355  LABPROT 15.3*  INR 1.2    Urinalysis: No results for input(s): COLORURINE, LABSPEC, PHURINE,  GLUCOSEU, HGBUR, BILIRUBINUR, KETONESUR, PROTEINUR, UROBILINOGEN, NITRITE, LEUKOCYTESUR in the last 72 hours.  Invalid input(s): APPERANCEUR    Imaging: CT Angio Chest PE W/Cm &/Or Wo Cm  Result Date: 09/24/2019 CLINICAL DATA:  Hemoptysis cough EXAM: CT ANGIOGRAPHY CHEST WITH CONTRAST TECHNIQUE: Multidetector CT imaging of the chest was performed using the standard protocol during bolus administration of intravenous contrast. Multiplanar CT image reconstructions and MIPs were obtained to evaluate the vascular anatomy. CONTRAST:  43mL OMNIPAQUE IOHEXOL 350 MG/ML SOLN COMPARISON:  Radiograph same day FINDINGS: Cardiovascular: There is a optimal opacification of the pulmonary arteries. There is no central,segmental, or subsegmental filling defects within the pulmonary arteries. There is moderate cardiomegaly. There is normal three-vessel brachiocephalic anatomy  without proximal stenosis. The thoracic aorta is normal in appearance. Coronary artery calcifications are seen. Overlying median sternotomy wires. Vascular stent seen within the right subclavian vein. Mediastinum/Nodes: No hilar, mediastinal, or axillary adenopathy. Thyroid gland, trachea, and esophagus demonstrate no significant findings. Lungs/Pleura: There is streaky/patchy airspace opacity seen at the posterior left lung base. There is streaky atelectasis or scarring within the right middle lobe. No pleural effusion. Upper Abdomen: Partially visualized low-density lesions seen within both kidneys. Musculoskeletal: No chest wall abnormality. No acute or significant osseous findings. Degenerative changes seen in the midthoracic spine. Review of the MIP images confirms the above findings. IMPRESSION: No central, segmental, or subsegmental pulmonary embolism. Patchy/streaky airspace opacity at the left lung base which could be due to atelectasis and/or infectious etiology. Electronically Signed   By: Prudencio Pair M.D.   On: 09/24/2019 04:47   DG Chest Port 1 View  Result Date: 09/24/2019 CLINICAL DATA:  Hemoptysis EXAM: PORTABLE CHEST 1 VIEW COMPARISON:  May 29, 2019 FINDINGS: Again noted is cardiomegaly. Overlying median sternotomy wires are present. A left-sided vascular stent is present. There is prominence of the central pulmonary vasculature. There is streaky opacity seen at both lung bases, right greater than left. Mildly increased interstitial markings throughout both lungs. No acute osseous abnormality IMPRESSION: Pulmonary vascular congestion and probable interstitial edema. Streaky opacity at both lung bases, likely subsegmental atelectasis Electronically Signed   By: Prudencio Pair M.D.   On: 09/24/2019 04:20     Medications:   . azithromycin Stopped (09/24/19 0736)  . cefTRIAXone (ROCEPHIN)  IV Stopped (09/24/19 [id number])   . Chlorhexidine Gluconate Cloth  6 each Topical Q0600  . LORazepam   0-4 mg Intravenous Q6H  . thiamine  100 mg Intravenous Daily     Assessment/ Plan:  [name] is a 59 y.o. white male with end stage renal disease secondary to IgA nephropathy, rheumatoid arthritis, atrial fibrillation, coronary artery disease status post CABG, COPD, congestive heart failure who presents to [facility] on 09/24/2019 for Hyperkalemia [E87.5] Pneumonia [J18.9] Hemoptysis [R04.2] Community acquired pneumonia, unspecified laterality [J18.9]  [facility] MWF [facility] RIJ permcath 68.5kg  1. End Stage Renal Disease: seen and examined on hemodialysis treatment.   2. Hypertension: history of difficult to control blood pressures. Home regimen of irbesartan, furosemide, clonidine, hydralazine - resume clonidine  3. Anemia of chronic kidney disease: hemoglobin 12.7. No indication for ESA.   4. Secondary Hyperparathyroidism: with hyperphosphatemia - Restart Auryxia and sevelamer - Cinacalcet  5. Hemoptysis: negative CTA of chest. COVID-19 negative.    LOS: 1 Daryl Weeks 1/11/20214:14 PM

## 2019-09-24 NOTE — Progress Notes (Signed)
This note also relates to the following rows which could not be included: Pulse Rate - Cannot attach notes to unvalidated device data Resp - Cannot attach notes to unvalidated device data BP - Cannot attach notes to unvalidated device data  Hd completed  

## 2019-09-24 NOTE — Progress Notes (Signed)
Established hemodialysis patient known at [facility] MWF 5:45. Patient normally self transports to treatments. Please contact me with any dialysis placement concerns.  Elvera Bicker Dialysis Coordinator [phone]

## 2019-09-24 NOTE — ED Notes (Signed)
Pt son Nicole Kindred updated on pt condition.

## 2019-09-24 NOTE — H&P (Signed)
History and Physical    Daryl Weeks [id number] DOB: 01/15/1961 DOA: 09/24/2019  PCP: Olin Hauser, DO   Patient coming from: home  I have personally briefly reviewed patient's old medical records in [place]  Chief Complaint: coughing up blood  HPI: Daryl Weeks is a 59 y.o. male with medical history significant for ESRD on HD MWF, atrial fibrillation on apixaban and Coumadin, COPD, diastolic heart failure and coronary artery disease who presented to the emergency room with sudden onset of coughing up blood while getting ready to go to dialysis today.  He denied chest pains or shortness of breath.  Denies fever or chills.  Is in his usual state of health until he started coughing this morning  ED Course: On arrival in the emergency room he appeared slightly dyspneic, had blood on his close and on a towel in his hand.  Was afebrile, blood pressure 143/97 heart rate 59, O2 sat 97% on room air.  White cell count was 8000 with hemoglobin of 12.4.  His chemistries he was noted to have a potassium of 5.9, sodium 129 and bicarb 21.  Ethanol level less than 10 lactic acid 0.5 troponin 42.  Had a CT angio of the chest that showed patchy airspace opacity left lung base representing either atelectasis or infection.  He was started on IV Rocephin and azithromycin.  He had no further episodes of hemoptysis in the emergency room.  Review of Systems: As per HPI otherwise 10 point review of systems negative.    Past Medical History:  Diagnosis Date  . (HFpEF) heart failure with preserved ejection fraction ([place])    a. 05/2019 Echo: EF 50-55%, diast dysfxn, RVSP 56.80mmHg, Sev dil LA. Mildly dil PA.  [name] Anemia   . Anxiety   . Arthritis   . Colon polyps   . COPD (chronic obstructive pulmonary disease) ([place])   . Coronary artery disease    a. 10/2013 NSTEMI/Cath: Severe 3VD-->CABG x 4 @ [facility] 01/2014 (LIMA->LAD, VG->OM1->OM2, VG->RCA); b. 11/2014 MV: No isch/infarct; c. 05/2019 MV: EF 40%  (50-55 by echo), small,mild, fixed apical defect - ? atten vs infarct. No ischemia->low risk.  . ESRD (end stage renal disease) ([place])    a. MWFSat HD  . ETOH abuse    a. 2 beers/night.  [name] GERD (gastroesophageal reflux disease)   . History of pneumonia   . Hyperlipidemia   . Hypertension   . IgA nephropathy   . Lower GI bleed    a. Due to colon polyps. Status post resection of 14 polyps  . Non-ST elevation MI (NSTEMI) ([place])   . PAF (paroxysmal atrial fibrillation) ([place])    a. Dx 05/2019. CHA2DS2VASc = 3-->Eliquis/Amio.  [name] Rheumatoid arthritis ([place])   . Shortness of breath   . Sleep apnea   . Tobacco abuse     Past Surgical History:  Procedure Laterality Date  . A/V FISTULAGRAM Left 10/28/2016   Procedure: A/V Fistulagram;  Surgeon: [name], MD;  Location: [place] [facility];  Service: Cardiovascular;  Laterality: Left;  . A/V FISTULAGRAM Left 12/07/2018   Procedure: A/V FISTULAGRAM;  Surgeon: [name], MD;  Location: [place];  Service: Cardiovascular;  Laterality: Left;  . A/V FISTULAGRAM N/A 06/04/2019   Procedure: A/V Fistulagram- LEFT;  Surgeon: [name], MD;  Location: [facility] [place] [facility];  Service: Cardiovascular;  Laterality: N/A;  . A/V SHUNT INTERVENTION N/A 10/28/2016   Procedure: A/V Shunt Intervention;  Surgeon:  [name], MD;  Location: [place] [facility];  Service: Cardiovascular;  Laterality: N/A;  . A/V SHUNTOGRAM Left 02/01/2019   Procedure: A/V SHUNTOGRAM;  Surgeon: [name], MD;  Location: [place] [facility];  Service: Cardiovascular;  Laterality: Left;  . AV FISTULA PLACEMENT    . CARDIAC CATHETERIZATION     RCA 90% and calcified mid LAD 80% Stenosis  . CORONARY ARTERY BYPASS GRAFT N/A 11/29/2013   Procedure: CORONARY ARTERY BYPASS GRAFTING (CABG) x 4 using endoscopically harvested right saphenous vein and left internal mammary artery;  Surgeon: [name], MD;  Location: [facility];  Service: Open Heart Surgery;  Laterality: N/A;    . dialysis catheterr  2/15  . DIALYSIS/PERMA CATHETER INSERTION N/A 07/16/2019   Procedure: DIALYSIS/PERMA CATHETER INSERTION WITH VAC CHANGE UNDER SEDATION;  Surgeon: [name], MD;  Location: [facility] [facility];  Service: Cardiovascular;  Laterality: N/A;  . DIALYSIS/PERMA CATHETER REMOVAL N/A 02/15/2019   Procedure: DIALYSIS/PERMA CATHETER REMOVAL;  Surgeon: [name], MD;  Location: [facility];  Service: Cardiovascular;  Laterality: N/A;  . INSERTION OF DIALYSIS CATHETER N/A 12/14/2018   Procedure: INSERTION OF DIALYSIS CATHETER ( [place] );  Surgeon: [name], MD;  Location: [facility];  Service: Vascular;  Laterality: N/A;  . INTRAOPERATIVE TRANSESOPHAGEAL ECHOCARDIOGRAM N/A 11/29/2013   Procedure: INTRAOPERATIVE TRANSESOPHAGEAL ECHOCARDIOGRAM;  Surgeon: [name], MD;  Location: [facility];  Service: Open Heart Surgery;  Laterality: N/A;  . LIGATION OF ARTERIOVENOUS  FISTULA Left 07/13/2019   Procedure: LIGATION OF ARTERIOVENOUS  FISTULA;  Surgeon: [name], MD;  Location: [facility];  Service: Vascular;  Laterality: Left;  . PERIPHERAL VASCULAR CATHETERIZATION N/A 06/12/2015   Procedure: A/V Shuntogram/Fistulagram;  Surgeon: [name], MD;  Location: [facility];  Service: Cardiovascular;  Laterality: N/A;  . PERIPHERAL VASCULAR CATHETERIZATION Left 06/12/2015   Procedure: A/V Shunt Intervention;  Surgeon: [name], MD;  Location: [facility];  Service: Cardiovascular;  Laterality: Left;  . RENAL BIOPSY Left 14  . REVISON OF ARTERIOVENOUS FISTULA Left 12/14/2018   Procedure: REVISON OF ARTERIOVENOUS FISTULA;  Surgeon: [name], MD;  Location: [facility];  Service: Vascular;  Laterality: Left;  . UPPER EXTREMITY VENOGRAPHY Bilateral 09/18/2019   Procedure: UPPER EXTREMITY VENOGRAPHY;  Surgeon: Katha Cabal, MD;  Location: [facility];  Service: Cardiovascular;  Laterality: Bilateral;     reports that he has been smoking cigarettes. He  has a 16.00 pack-year smoking history. He has never used smokeless tobacco. He reports current alcohol use of about 7.0 standard drinks of alcohol per week. He reports that he does not use drugs.  Allergies  Allergen Reactions  . Lisinopril     Family History  Problem Relation Age of Onset  . Heart disease Father   . Heart disease Brother   . Healthy Sister   . Stroke Neg Hx      Prior to Admission medications   Medication Sig Start Date End Date Taking? Authorizing Provider  albuterol (VENTOLIN HFA) 108 (90 Base) MCG/ACT inhaler Inhale 2 puffs into the lungs every 4 (four) hours as needed for wheezing or shortness of breath.     [provider]  amiodarone (PACERONE) 200 MG tablet Take 200 mg by mouth 2 (two) times daily.  09/02/19   [provider]  amLODipine (NORVASC) 10 MG tablet take 1 tablet by mouth once daily Patient taking differently: Take 10  mg by mouth at bedtime.  06/29/16   Wellington Hampshire, MD  apixaban (ELIQUIS) 2.5 MG TABS tablet Take 2.5 mg by mouth 2 (two) times daily.    [provider]  aspirin EC 81 MG EC tablet Take 1 tablet (81 mg total) by mouth daily. 07/17/19   Stegmayer, Joelene Millin A, PA-C  atorvastatin (LIPITOR) 20 MG tablet take 1 tablet by mouth at bedtime 04/25/14   Gollan, Kathlene November, MD  AURYXIA 1 GM 210 MG(Fe) tablet Take 420 mg by mouth 3 (three) times daily with meals.     [provider]  budesonide (PULMICORT FLEXHALER) 180 MCG/ACT inhaler Inhale 1 puff into the lungs 2 (two) times daily. 02/21/18   Mikey College, NP  budesonide (PULMICORT) 0.5 MG/2ML nebulizer solution Take 2 mLs (0.5 mg total) by nebulization every 12 (twelve) hours. 06/03/17   Laverle Hobby, MD  calcium carbonate (TUMS - DOSED IN MG ELEMENTAL CALCIUM) 500 MG chewable tablet Chew 1 tablet by mouth as needed.     [provider]  carvedilol (COREG) 25 MG tablet Take 1 tablet (25 mg total) by mouth 2 (two) times daily with a  meal. 06/05/19   Gouru, Aruna, MD  cinacalcet (SENSIPAR) 30 MG tablet Take 30 mg by mouth daily with supper.     [provider]  cloNIDine (CATAPRES) 0.2 MG tablet Take 1 tablet (0.2 mg total) by mouth 3 (three) times daily. 06/05/19   Nicholes Mango, MD  docusate sodium (COLACE) 100 MG capsule Take 1 capsule (100 mg total) by mouth 2 (two) times daily as needed for mild constipation. 06/05/19   Nicholes Mango, MD  furosemide (LASIX) 80 MG tablet Take 80 mg by mouth 2 (two) times daily.     [provider]  gabapentin (NEURONTIN) 100 MG capsule Take 100 mg by mouth 3 (three) times daily.  01/14/18   [provider]  hydrALAZINE (APRESOLINE) 100 MG tablet Take 50 mg by mouth 3 (three) times daily.     [provider]  HYDROcodone-acetaminophen (NORCO/VICODIN) 5-325 MG tablet Take 1 tablet by mouth at bedtime.    [provider]  irbesartan (AVAPRO) 300 MG tablet Take 1 tablet (300 mg total) by mouth at bedtime. 06/05/19   Nicholes Mango, MD  loratadine (CLARITIN) 10 MG tablet Take 1 tablet (10 mg total) by mouth daily. 06/06/19   Nicholes Mango, MD  multivitamin (RENA-VIT) TABS tablet Take 1 tablet by mouth daily.  05/22/18   [provider]  nitroGLYCERIN (NITROSTAT) 0.4 MG SL tablet Place 1 tablet (0.4 mg total) under the tongue every 5 (five) minutes as needed for chest pain. 06/05/19   Gouru, Illene Silver, MD  ondansetron (ZOFRAN-ODT) 4 MG disintegrating tablet Take 4 mg by mouth 2 (two) times daily as needed for nausea.    [provider]  sevelamer carbonate (RENVELA) 800 MG tablet Take 2,400 mg by mouth 3 (three) times daily with meals.     [provider]  sevelamer carbonate (RENVELA) 800 MG tablet Take 800 mg by mouth with snacks.    [provider]  tiotropium (SPIRIVA) 18 MCG inhalation capsule Place 1 capsule (18 mcg total) into inhaler and inhale daily. Patient taking differently: Place 18 mcg into inhaler and inhale every evening.   05/24/17   Laverle Hobby, MD  zolpidem (AMBIEN) 10 MG tablet Take 10 mg by mouth at bedtime. 07/20/19   [provider]    Physical Exam: Vitals:   09/24/19 [id number] 09/24/19 0407  09/24/19 0415 09/24/19 0417  BP: (!) 157/79   (!) 157/79  Pulse: 64   64  Resp: 15     Temp:      TempSrc:      SpO2: 95%  97%   Weight:  72.6 kg    Height:  5\' 7"  (1.702 m)       Vitals:   09/24/19 0402 09/24/19 0407 09/24/19 0415 09/24/19 0417  BP: (!) 157/79   (!) 157/79  Pulse: 64   64  Resp: 15     Temp:      TempSrc:      SpO2: 95%  97%   Weight:  72.6 kg    Height:  5\' 7"  (1.702 m)      Constitutional: unkempt appearance, alert and oriented x 3 Eyes: PERRL, lids and conjunctivae normal ENMT: Mucous membranes are moist.  Neck: normal, supple, no masses, no thyromegaly Respiratory: clear to auscultation bilaterally, no wheezing, no crackles. Normal respiratory effort. No accessory muscle use.  Cardiovascular: Regular rate and rhythm, no murmurs / rubs / gallops. No extremity edema. 2+ pedal pulses. No carotid bruits.  Abdomen: no tenderness, no masses palpated. No hepatosplenomegaly. Bowel sounds positive.  Musculoskeletal: no clubbing / cyanosis. No joint deformity upper and lower extremities.  Skin: no rashes, lesions, ulcers.  Neurologic: No gross focal neurologic deficit. Psychiatric: Normal mood and affect.   Labs on Admission: I have personally reviewed following labs and imaging studies  CBC: Recent Labs  Lab 09/24/19 0355  WBC 8.4  NEUTROABS 5.2  HGB 12.7*  HCT 36.8*  MCV 98.9  PLT [id number]   Basic Metabolic Panel: Recent Labs  Lab 09/24/19 0355  NA 129*  K 5.9*  CL 93*  CO2 21*  GLUCOSE 77  BUN 57*  CREATININE 10.66*  CALCIUM 10.2   GFR: Estimated Creatinine Clearance: 7.2 mL/min (A) (by C-G formula based on SCr of 10.66 mg/dL (H)). Liver Function Tests: Recent Labs  Lab 09/24/19 0355  AST 17  ALT 8  ALKPHOS 82  BILITOT 0.8  PROT 6.9    ALBUMIN 3.3*   No results for input(s): LIPASE, AMYLASE in the last 168 hours. No results for input(s): AMMONIA in the last 168 hours. Coagulation Profile: Recent Labs  Lab 09/24/19 0355  INR 1.2   Cardiac Enzymes: No results for input(s): CKTOTAL, CKMB, CKMBINDEX, TROPONINI in the last 168 hours. BNP (last 3 results) No results for input(s): PROBNP in the last 8760 hours. HbA1C: No results for input(s): HGBA1C in the last 72 hours. CBG: No results for input(s): GLUCAP in the last 168 hours. Lipid Profile: No results for input(s): CHOL, HDL, LDLCALC, TRIG, CHOLHDL, LDLDIRECT in the last 72 hours. Thyroid Function Tests: No results for input(s): TSH, T4TOTAL, FREET4, T3FREE, THYROIDAB in the last 72 hours. Anemia Panel: No results for input(s): VITAMINB12, FOLATE, FERRITIN, TIBC, IRON, RETICCTPCT in the last 72 hours. Urine analysis:    Component Value Date/Time   COLORURINE Amber 01/12/2014 0513   COLORURINE YELLOW 11/27/2013 1309   APPEARANCEUR Cloudy 01/12/2014 0513   LABSPEC 1.028 01/12/2014 0513   PHURINE 5.0 01/12/2014 0513   PHURINE 7.5 11/27/2013 1309   GLUCOSEU 50 mg/dL 01/12/2014 0513   HGBUR 1+ 01/12/2014 0513   HGBUR SMALL (A) 11/27/2013 1309   BILIRUBINUR Negative 01/12/2014 0513   KETONESUR Trace 01/12/2014 0513   KETONESUR NEGATIVE 11/27/2013 1309   PROTEINUR >=500 01/12/2014 0513   PROTEINUR >300 (A) 11/27/2013 1309   UROBILINOGEN 0.2 11/27/2013 1309  NITRITE Negative 01/12/2014 0513   NITRITE NEGATIVE 11/27/2013 1309   LEUKOCYTESUR 3+ 01/12/2014 0513    Radiological Exams on Admission: CT Angio Chest PE W/Cm &/Or Wo Cm  Result Date: 09/24/2019 CLINICAL DATA:  Hemoptysis cough EXAM: CT ANGIOGRAPHY CHEST WITH CONTRAST TECHNIQUE: Multidetector CT imaging of the chest was performed using the standard protocol during bolus administration of intravenous contrast. Multiplanar CT image reconstructions and MIPs were obtained to evaluate the vascular  anatomy. CONTRAST:  36mL OMNIPAQUE IOHEXOL 350 MG/ML SOLN COMPARISON:  Radiograph same day FINDINGS: Cardiovascular: There is a optimal opacification of the pulmonary arteries. There is no central,segmental, or subsegmental filling defects within the pulmonary arteries. There is moderate cardiomegaly. There is normal three-vessel brachiocephalic anatomy without proximal stenosis. The thoracic aorta is normal in appearance. Coronary artery calcifications are seen. Overlying median sternotomy wires. Vascular stent seen within the right subclavian vein. Mediastinum/Nodes: No hilar, mediastinal, or axillary adenopathy. Thyroid gland, trachea, and esophagus demonstrate no significant findings. Lungs/Pleura: There is streaky/patchy airspace opacity seen at the posterior left lung base. There is streaky atelectasis or scarring within the right middle lobe. No pleural effusion. Upper Abdomen: Partially visualized low-density lesions seen within both kidneys. Musculoskeletal: No chest wall abnormality. No acute or significant osseous findings. Degenerative changes seen in the midthoracic spine. Review of the MIP images confirms the above findings. IMPRESSION: No central, segmental, or subsegmental pulmonary embolism. Patchy/streaky airspace opacity at the left lung base which could be due to atelectasis and/or infectious etiology. Electronically Signed   By: Prudencio Pair M.D.   On: 09/24/2019 04:47   DG Chest Port 1 View  Result Date: 09/24/2019 CLINICAL DATA:  Hemoptysis EXAM: PORTABLE CHEST 1 VIEW COMPARISON:  May 29, 2019 FINDINGS: Again noted is cardiomegaly. Overlying median sternotomy wires are present. A left-sided vascular stent is present. There is prominence of the central pulmonary vasculature. There is streaky opacity seen at both lung bases, right greater than left. Mildly increased interstitial markings throughout both lungs. No acute osseous abnormality IMPRESSION: Pulmonary vascular congestion and  probable interstitial edema. Streaky opacity at both lung bases, likely subsegmental atelectasis Electronically Signed   By: Prudencio Pair M.D.   On: 09/24/2019 04:20    EKG: Independently reviewed.   Assessment/Plan Principal Problem:   Left lower lobe pneumonia -IV Rocephin and azithromycin -Blood and sputum cultures    Cough with hemoptysis -Secondary to pneumonia -Hold home apixaban -Monitor H&H    Coronary artery disease -No complaints of chest pain Continue home meds.    Chronic diastolic heart failure ([place]) Appears euvolemic Continue home meds    ESRD on dialysis [facility]) Patient on dialysis Monday Wednesday Friday Nephrology consult placed for continuation of dialysis    COPD (chronic obstructive pulmonary disease) ([place]) Not acutely exacerbated Continue home meds when reconciled    Atrial fibrillation, chronic ([facility]) Continue amiodarone once reconciled With apixaban  Hyperkalemia Treated in the emergency room Improved with dialysis Continue to monitor       DVT prophylaxis: scd  Code Status: full code  Family Communication: none  Disposition Plan: Back to previous home environment Consults called: Dr Rosalio Loud, nephrology     Athena Masse MD [facility]     09/24/2019, 5:15 AM

## 2019-09-24 NOTE — ED Notes (Signed)
Son, power of attorney, requesting he be the only one that gets updates at this time.

## 2019-09-24 NOTE — ED Triage Notes (Signed)
Patient reports hemoptysis for the last 30-45 mins. MWF dialysis patient (L) fistula no longer working, has (R) chest access for dialysis

## 2019-09-24 NOTE — ED Provider Notes (Signed)
[facility] Emergency Department Provider Note   ____________________________________________   First MD Initiated Contact with Patient 09/24/19 0350     (approximate)  I have reviewed the triage vital signs and the nursing notes.   HISTORY  Chief Complaint Hemoptysis    HPI Daryl Weeks is a 59 y.o. male brought to the ED from home via EMS with a chief complaint of hemoptysis.  Patient has a history of renal failure on HD M/W/F.  Patient was awake and getting ready for 5 AM dialysis when he began to cough up bright red blood.  Denies recent fever, chest pain, shortness of breath, abdominal pain, nausea or vomiting.  Takes Eliquis.  History of EtOH abuse.      Past Medical History:  Diagnosis Date  . (HFpEF) heart failure with preserved ejection fraction ([place])    a. 05/2019 Echo: EF 50-55%, diast dysfxn, RVSP 56.44mmHg, Sev dil LA. Mildly dil PA.  [name] Anemia   . Anxiety   . Arthritis   . Colon polyps   . COPD (chronic obstructive pulmonary disease) ([place])   . Coronary artery disease    a. 10/2013 NSTEMI/Cath: Severe 3VD-->CABG x 4 @ [facility] 01/2014 (LIMA->LAD, VG->OM1->OM2, VG->RCA); b. 11/2014 MV: No isch/infarct; c. 05/2019 MV: EF 40% (50-55 by echo), small,mild, fixed apical defect - ? atten vs infarct. No ischemia->low risk.  . ESRD (end stage renal disease) ([place])    a. MWFSat HD  . ETOH abuse    a. 2 beers/night.  [name] GERD (gastroesophageal reflux disease)   . History of pneumonia   . Hyperlipidemia   . Hypertension   . IgA nephropathy   . Lower GI bleed    a. Due to colon polyps. Status post resection of 14 polyps  . Non-ST elevation MI (NSTEMI) ([place])   . PAF (paroxysmal atrial fibrillation) ([place])    a. Dx 05/2019. CHA2DS2VASc = 3-->Eliquis/Amio.  [name] Rheumatoid arthritis ([place])   . Shortness of breath   . Sleep apnea   . Tobacco abuse     Patient Active Problem List   Diagnosis Date Noted  . Complication of AV dialysis fistula 07/13/2019  .  HCAP (healthcare-associated pneumonia) 05/28/2019  . Complication of vascular access for dialysis 11/28/2018  . COPD (chronic obstructive pulmonary disease) ([place]) 12/05/2017  . End stage renal disease ([place]) 10/05/2016  . IgA nephropathy 11/05/2014  . S/P CABG x 4 11/29/2013  . Chronic diastolic heart failure ([place]) 11/16/2013  . Coronary artery disease   . Hyperlipidemia   . Hypertensive disorder   . Dyspnea 11/20/2012  . Tobacco abuse     Past Surgical History:  Procedure Laterality Date  . A/V FISTULAGRAM Left 10/28/2016   Procedure: A/V Fistulagram;  Surgeon: [name], MD;  Location: [place] [facility];  Service: Cardiovascular;  Laterality: Left;  . A/V FISTULAGRAM Left 12/07/2018   Procedure: A/V FISTULAGRAM;  Surgeon: [name], MD;  Location: [place] [facility];  Service: Cardiovascular;  Laterality: Left;  . A/V FISTULAGRAM N/A 06/04/2019   Procedure: A/V Fistulagram- LEFT;  Surgeon: [name], MD;  Location: [place] [facility];  Service: Cardiovascular;  Laterality: N/A;  . A/V SHUNT INTERVENTION N/A 10/28/2016   Procedure: A/V Shunt Intervention;  Surgeon: [name], MD;  Location: [place] [facility];  Service: Cardiovascular;  Laterality: N/A;  . A/V SHUNTOGRAM Left 02/01/2019   Procedure: A/V SHUNTOGRAM;  Surgeon: [name], MD;  Location: [place] [facility];  Service: Cardiovascular;  Laterality: Left;  . AV FISTULA PLACEMENT    . CARDIAC CATHETERIZATION     RCA 90% and calcified mid LAD 80% Stenosis  . CORONARY ARTERY BYPASS GRAFT N/A 11/29/2013   Procedure: CORONARY ARTERY BYPASS GRAFTING (CABG) x 4 using endoscopically harvested right saphenous vein and left internal mammary artery;  Surgeon: [name], MD;  Location: [place] OR;  Service: Open Heart Surgery;  Laterality: N/A;  . dialysis catheterr  2/15  . DIALYSIS/PERMA CATHETER INSERTION N/A 07/16/2019   Procedure: DIALYSIS/PERMA CATHETER INSERTION WITH VAC CHANGE UNDER SEDATION;  Surgeon: [name], MD;  Location: [place] CV [facility];  Service: Cardiovascular;  Laterality: N/A;  . DIALYSIS/PERMA CATHETER REMOVAL N/A 02/15/2019   Procedure: DIALYSIS/PERMA CATHETER REMOVAL;  Surgeon: [name], MD;  Location: [place] [facility];  Service: Cardiovascular;  Laterality: N/A;  . INSERTION OF DIALYSIS CATHETER N/A 12/14/2018   Procedure: INSERTION OF DIALYSIS CATHETER ( [place] );  Surgeon: [name], MD;  Location: [facility];  Service: Vascular;  Laterality: N/A;  . INTRAOPERATIVE TRANSESOPHAGEAL ECHOCARDIOGRAM N/A 11/29/2013   Procedure: INTRAOPERATIVE TRANSESOPHAGEAL ECHOCARDIOGRAM;  Surgeon: [name], MD;  Location: [facility] [facility];  Service: Open Heart Surgery;  Laterality: N/A;  . LIGATION OF ARTERIOVENOUS  FISTULA Left 07/13/2019   Procedure: LIGATION OF ARTERIOVENOUS  FISTULA;  Surgeon: [name], MD;  Location: [facility] ORS;  Service: Vascular;  Laterality: Left;  . PERIPHERAL VASCULAR CATHETERIZATION N/A 06/12/2015   Procedure: A/V Shuntogram/Fistulagram;  Surgeon: [name], MD;  Location: [place] [facility];  Service: Cardiovascular;  Laterality: N/A;  . PERIPHERAL VASCULAR CATHETERIZATION Left 06/12/2015   Procedure: A/V Shunt Intervention;  Surgeon: [name], MD;  Location: [place] [facility];  Service: Cardiovascular;  Laterality: Left;  . RENAL BIOPSY Left 14  . REVISON OF ARTERIOVENOUS FISTULA Left 12/14/2018   Procedure: REVISON OF ARTERIOVENOUS FISTULA;  Surgeon: [name], MD;  Location: [facility];  Service: Vascular;  Laterality: Left;  . UPPER EXTREMITY VENOGRAPHY Bilateral 09/18/2019   Procedure: UPPER EXTREMITY VENOGRAPHY;  Surgeon: Katha Cabal, MD;  Location: [place] [facility];  Service: Cardiovascular;  Laterality: Bilateral;    Prior to Admission medications   Medication Sig Start Date End Date Taking? Authorizing Provider  albuterol (VENTOLIN HFA) 108 (90 Base) MCG/ACT inhaler Inhale 2 puffs into the lungs every 4 (four) hours as needed for  wheezing or shortness of breath.     [provider]  amiodarone (PACERONE) 200 MG tablet Take 200 mg by mouth 2 (two) times daily.  09/02/19   [provider]  amLODipine (NORVASC) 10 MG tablet take 1 tablet by mouth once daily Patient taking differently: Take 10 mg by mouth at bedtime.  06/29/16   Wellington Hampshire, MD  apixaban (ELIQUIS) 2.5 MG TABS tablet Take 2.5 mg by mouth 2 (two) times daily.    [provider]  aspirin EC 81 MG EC tablet Take 1 tablet (81 mg total) by mouth daily. 07/17/19   Stegmayer, Joelene Millin A, PA-C  atorvastatin (LIPITOR) 20 MG tablet take 1 tablet by mouth at bedtime 04/25/14   Gollan, Kathlene November, MD  AURYXIA 1 GM 210 MG(Fe) tablet Take 420 mg by mouth 3 (three) times daily with meals.     [provider]  budesonide (PULMICORT FLEXHALER) 180 MCG/ACT inhaler Inhale 1 puff into the lungs 2 (two) times daily. 02/21/18   Mikey College, NP  budesonide (PULMICORT) 0.5  MG/2ML nebulizer solution Take 2 mLs (0.5 mg total) by nebulization every 12 (twelve) hours. 06/03/17   Laverle Hobby, MD  calcium carbonate (TUMS - DOSED IN MG ELEMENTAL CALCIUM) 500 MG chewable tablet Chew 1 tablet by mouth as needed.     [provider]  carvedilol (COREG) 25 MG tablet Take 1 tablet (25 mg total) by mouth 2 (two) times daily with a meal. 06/05/19   Gouru, Aruna, MD  cinacalcet (SENSIPAR) 30 MG tablet Take 30 mg by mouth daily with supper.     [provider]  cloNIDine (CATAPRES) 0.2 MG tablet Take 1 tablet (0.2 mg total) by mouth 3 (three) times daily. 06/05/19   Nicholes Mango, MD  docusate sodium (COLACE) 100 MG capsule Take 1 capsule (100 mg total) by mouth 2 (two) times daily as needed for mild constipation. 06/05/19   Nicholes Mango, MD  furosemide (LASIX) 80 MG tablet Take 80 mg by mouth 2 (two) times daily.     [provider]  gabapentin (NEURONTIN) 100 MG capsule Take 100 mg by mouth 3 (three) times daily.  01/14/18    [provider]  hydrALAZINE (APRESOLINE) 100 MG tablet Take 50 mg by mouth 3 (three) times daily.     [provider]  HYDROcodone-acetaminophen (NORCO/VICODIN) 5-325 MG tablet Take 1 tablet by mouth at bedtime.    [provider]  irbesartan (AVAPRO) 300 MG tablet Take 1 tablet (300 mg total) by mouth at bedtime. 06/05/19   Nicholes Mango, MD  loratadine (CLARITIN) 10 MG tablet Take 1 tablet (10 mg total) by mouth daily. 06/06/19   Nicholes Mango, MD  multivitamin (RENA-VIT) TABS tablet Take 1 tablet by mouth daily.  05/22/18   [provider]  nitroGLYCERIN (NITROSTAT) 0.4 MG SL tablet Place 1 tablet (0.4 mg total) under the tongue every 5 (five) minutes as needed for chest pain. 06/05/19   Gouru, Illene Silver, MD  ondansetron (ZOFRAN-ODT) 4 MG disintegrating tablet Take 4 mg by mouth 2 (two) times daily as needed for nausea.    [provider]  sevelamer carbonate (RENVELA) 800 MG tablet Take 2,400 mg by mouth 3 (three) times daily with meals.     [provider]  sevelamer carbonate (RENVELA) 800 MG tablet Take 800 mg by mouth with snacks.    [provider]  tiotropium (SPIRIVA) 18 MCG inhalation capsule Place 1 capsule (18 mcg total) into inhaler and inhale daily. Patient taking differently: Place 18 mcg into inhaler and inhale every evening.  05/24/17   Laverle Hobby, MD  zolpidem (AMBIEN) 10 MG tablet Take 10 mg by mouth at bedtime. 07/20/19   [provider]    Allergies Lisinopril  Family History  Problem Relation Age of Onset  . Heart disease Father   . Heart disease Brother   . Healthy Sister   . Stroke Neg Hx     Social History Social History   Tobacco Use  . Smoking status: Current Every Day Smoker    Packs/day: 0.50    Years: 32.00    Pack years: 16.00    Types: Cigarettes  . Smokeless tobacco: Never Used  . Tobacco comment: daily  Substance Use Topics  . Alcohol use: Yes    Alcohol/week: 7.0 standard  drinks    Types: 7 Cans of beer per week  . Drug use: No    Review of Systems  Constitutional: No fever/chills Eyes: No visual changes. ENT: No sore throat. Cardiovascular: Denies chest pain. Respiratory: Positive  for hemoptysis.  Denies shortness of breath. Gastrointestinal: No abdominal pain.  No nausea, no vomiting.  No diarrhea.  No constipation. Genitourinary: Negative for dysuria. Musculoskeletal: Negative for back pain. Skin: Negative for rash. Neurological: Negative for headaches, focal weakness or numbness.   ____________________________________________   PHYSICAL EXAM:  VITAL SIGNS: ED Triage Vitals  Enc Vitals Group     BP      Pulse      Resp      Temp      Temp src      SpO2      Weight      Height      Head Circumference      Peak Flow      Pain Score      Pain Loc      Pain Edu?      Excl. in [place]?     Constitutional: Alert and oriented.  Ill appearing and in moderate acute distress. Eyes: Conjunctivae are normal. PERRL. EOMI. Head: Atraumatic. Nose: No congestion/rhinnorhea. Mouth/Throat: Mucous membranes are moist.   Neck: No stridor.   Cardiovascular: Normal rate, regular rhythm. Grossly normal heart sounds.  Good peripheral circulation. Respiratory: Normal respiratory effort.  No retractions.  Active hemoptysis.  Right dialysis chest catheter. Gastrointestinal: Soft and nontender to light or deep palpation. No distention. No abdominal bruits. No CVA tenderness. Musculoskeletal: Nonfunctioning LUE AV fistula no lower extremity tenderness nor edema.  No joint effusions. Neurologic:  Normal speech and language. No gross focal neurologic deficits are appreciated.  Skin:  Skin is warm, dry and intact. No rash noted. Psychiatric: Mood and affect are normal. Speech and behavior are normal.  ____________________________________________   LABS (all labs ordered are listed, but only abnormal results are displayed)  Labs Reviewed  CBC WITH  DIFFERENTIAL/PLATELET - Abnormal; Notable for the following components:      Result Value   RBC 3.72 (*)    Hemoglobin 12.7 (*)    HCT 36.8 (*)    MCH 34.1 (*)    Monocytes Absolute 1.7 (*)    All other components within normal limits  COMPREHENSIVE METABOLIC PANEL - Abnormal; Notable for the following components:   Sodium 129 (*)    Potassium 5.9 (*)    Chloride 93 (*)    CO2 21 (*)    BUN 57 (*)    Creatinine, Ser 10.66 (*)    Albumin 3.3 (*)    GFR calc non Af Amer 5 (*)    GFR calc Af Amer 6 (*)    All other components within normal limits  PROTIME-INR - Abnormal; Notable for the following components:   Prothrombin Time 15.3 (*)    All other components within normal limits  TROPONIN I (HIGH SENSITIVITY) - Abnormal; Notable for the following components:   Troponin I (High Sensitivity) 42 (*)    All other components within normal limits  CULTURE, BLOOD (ROUTINE X 2)  CULTURE, BLOOD (ROUTINE X 2)  LACTIC ACID, PLASMA  ETHANOL  LACTIC ACID, PLASMA  POC SARS CORONAVIRUS 2 AG -  ED  TYPE AND SCREEN   ____________________________________________  EKG  ED ECG REPORT I, Junette Bernat J, the attending physician, personally viewed and interpreted this ECG.   Date: 09/24/2019  EKG Time: 0359  Rate: 66  Rhythm: normal EKG, normal sinus rhythm  Axis: Normal  Intervals:none  ST&T Change: Nonspecific  ____________________________________________  RADIOLOGY  ED MD interpretation: Chest x-ray indicates pulmonary vascular congestion; CT demonstrates patchy airspace opacity concerning  for infectious etiology  Official radiology report(s): CT Angio Chest PE W/Cm &/Or Wo Cm  Result Date: 09/24/2019 CLINICAL DATA:  Hemoptysis cough EXAM: CT ANGIOGRAPHY CHEST WITH CONTRAST TECHNIQUE: Multidetector CT imaging of the chest was performed using the standard protocol during bolus administration of intravenous contrast. Multiplanar CT image reconstructions and MIPs were obtained to  evaluate the vascular anatomy. CONTRAST:  53mL OMNIPAQUE IOHEXOL 350 MG/ML SOLN COMPARISON:  Radiograph same day FINDINGS: Cardiovascular: There is a optimal opacification of the pulmonary arteries. There is no central,segmental, or subsegmental filling defects within the pulmonary arteries. There is moderate cardiomegaly. There is normal three-vessel brachiocephalic anatomy without proximal stenosis. The thoracic aorta is normal in appearance. Coronary artery calcifications are seen. Overlying median sternotomy wires. Vascular stent seen within the right subclavian vein. Mediastinum/Nodes: No hilar, mediastinal, or axillary adenopathy. Thyroid gland, trachea, and esophagus demonstrate no significant findings. Lungs/Pleura: There is streaky/patchy airspace opacity seen at the posterior left lung base. There is streaky atelectasis or scarring within the right middle lobe. No pleural effusion. Upper Abdomen: Partially visualized low-density lesions seen within both kidneys. Musculoskeletal: No chest wall abnormality. No acute or significant osseous findings. Degenerative changes seen in the midthoracic spine. Review of the MIP images confirms the above findings. IMPRESSION: No central, segmental, or subsegmental pulmonary embolism. Patchy/streaky airspace opacity at the left lung base which could be due to atelectasis and/or infectious etiology. Electronically Signed   By: Prudencio Pair M.D.   On: 09/24/2019 04:47   DG Chest Port 1 View  Result Date: 09/24/2019 CLINICAL DATA:  Hemoptysis EXAM: PORTABLE CHEST 1 VIEW COMPARISON:  May 29, 2019 FINDINGS: Again noted is cardiomegaly. Overlying median sternotomy wires are present. A left-sided vascular stent is present. There is prominence of the central pulmonary vasculature. There is streaky opacity seen at both lung bases, right greater than left. Mildly increased interstitial markings throughout both lungs. No acute osseous abnormality IMPRESSION: Pulmonary  vascular congestion and probable interstitial edema. Streaky opacity at both lung bases, likely subsegmental atelectasis Electronically Signed   By: Prudencio Pair M.D.   On: 09/24/2019 04:20    ____________________________________________   PROCEDURES  Procedure(s) performed (including Critical Care):  Procedures  CRITICAL CARE Performed by: Paulette Blanch   Total critical care time: 45 minutes  Critical care time was exclusive of separately billable procedures and treating other patients.  Critical care was necessary to treat or prevent imminent or life-threatening deterioration.  Critical care was time spent personally by me on the following activities: development of treatment plan with patient and/or surrogate as well as nursing, discussions with consultants, evaluation of patient's response to treatment, examination of patient, obtaining history from patient or surrogate, ordering and performing treatments and interventions, ordering and review of laboratory studies, ordering and review of radiographic studies, pulse oximetry and re-evaluation of patient's condition.  ____________________________________________   INITIAL IMPRESSION / ASSESSMENT AND PLAN / ED COURSE  As part of my medical decision making, I reviewed the following data within the [place] notes reviewed and incorporated, Labs reviewed, EKG interprete, Old chart reviewed, Radiograph reviewed, Discussed with admitting physician and Notes from prior ED visits     Daryl Weeks was evaluated in Emergency Department on 09/24/2019 for the symptoms described in the history of present illness. He was evaluated in the context of the global COVID-19 pandemic, which necessitated consideration that the patient might be at risk for infection with the SARS-CoV-2 virus that causes COVID-19. Institutional protocols and algorithms  that pertain to the evaluation of patients at risk for COVID-19 are in a state  of rapid change based on information released by regulatory bodies including the CDC and federal and state organizations. These policies and algorithms were followed during the patient's care in the ED.    59 year old dialysis patient who presents with hemoptysis. Differential includes, but is not limited to, viral syndrome, bronchitis including COPD exacerbation, pneumonia, reactive airway disease including asthma, CHF including exacerbation with or without pulmonary/interstitial edema, pneumothorax, ACS, thoracic trauma, and pulmonary embolism.  Will establish two large-bore IVs, basic lab work including EtOH.  CTA chest to evaluate for PE.  Patient is currently hemodynamically stable without tachycardia or hypoxia.  Anticipate hospitalization.   Clinical Course as of Sep 23 504  Mon Sep 24, 2019  [id number] Discussed with pharmacy; will try nebulized TXA.   [JS]  [id number] Patient resting; currently not coughing.  Will hold nebulized TXA.  CT and potassium noted.  Will start IV antibiotics and hyperkalemia cocktail.   [JS]    Clinical Course User Index [JS] Paulette Blanch, MD     ____________________________________________   FINAL CLINICAL IMPRESSION(S) / ED DIAGNOSES  Final diagnoses:  Hemoptysis  Community acquired pneumonia, unspecified laterality  Hyperkalemia     ED Discharge Orders    None       Note:  This document was prepared using Dragon voice recognition software and may include unintentional dictation errors.   Paulette Blanch, MD 09/24/19 [phone]

## 2019-09-24 NOTE — Progress Notes (Signed)
Same day rounding progress note  Left lower lobe pneumonia -continue IV Rocephin and azithromycin -Blood and sputum cultures  Hemoptysis -Secondary to pneumonia -Hold home apixaban -Monitor H&H - nursing concerned about possible hematemesis - requested GI c/s although It seems more like hemoptysis to me    Coronary artery disease -No complaints of chest pain Continue home meds.    Chronic diastolic heart failure ([place]) Appears euvolemic Continue home meds    ESRD on dialysis [facility]) Patient on dialysis Monday Wednesday Friday Nephrology following for dialysis needs    COPD (chronic obstructive pulmonary disease) ([place]) Not acutely exacerbated Continue home meds when reconciled    Atrial fibrillation, chronic (HCC) Continue amiodarone once reconciled With apixaban  Hyperkalemia Treated in the emergency room Improved with dialysis Continue to monitor  Time spent - 15 mins

## 2019-09-25 ENCOUNTER — Encounter: Payer: Self-pay | Admitting: Internal Medicine

## 2019-09-25 DIAGNOSIS — I5032 Chronic diastolic (congestive) heart failure: Secondary | ICD-10-CM | POA: Diagnosis not present

## 2019-09-25 DIAGNOSIS — I482 Chronic atrial fibrillation, unspecified: Secondary | ICD-10-CM | POA: Diagnosis not present

## 2019-09-25 DIAGNOSIS — J189 Pneumonia, unspecified organism: Secondary | ICD-10-CM | POA: Diagnosis not present

## 2019-09-25 MED ORDER — PNEUMOCOCCAL VAC POLYVALENT 25 MCG/0.5ML IJ INJ: 0.5000 mL | INJECTION | INTRAMUSCULAR | Status: DC

## 2019-09-25 MED ORDER — HYDRALAZINE HCL 50 MG PO TABS: 50.0000 mg | ORAL_TABLET | Freq: Three times a day (TID) | ORAL | Status: DC

## 2019-09-25 MED ORDER — ASPIRIN 81 MG PO TBEC: 81.0000 mg | DELAYED_RELEASE_TABLET | Freq: Every day | ORAL | 0 refills | Status: DC

## 2019-09-25 MED ORDER — APIXABAN 2.5 MG PO TABS: 2.5000 mg | ORAL_TABLET | Freq: Two times a day (BID) | ORAL | Status: DC

## 2019-09-25 MED ORDER — PREDNISONE 10 MG PO TABS: 10.0000 mg | ORAL_TABLET | Freq: Every day | ORAL | 0 refills | Status: DC

## 2019-09-25 MED ORDER — AMLODIPINE BESYLATE 10 MG PO TABS
10.0000 mg | ORAL_TABLET | Freq: Every day | ORAL | Status: DC
  Administered 2019-09-25: 10 mg via ORAL
  Filled 2019-09-25: qty 1

## 2019-09-25 NOTE — Care Management CC44 (Signed)
Condition Code 44 Documentation Completed  Patient Details  Name: GRAYSON SUHRE MRN: [id number] Date of Birth: 04-24-1961   Condition Code 44 given:  Yes Patient signature on Condition Code 44 notice:  Yes Documentation of 2 MD's agreement:  Yes Code 44 added to claim:  Yes    Beverly Sessions, RN 09/25/2019, 1:58 PM

## 2019-09-25 NOTE — Discharge Instructions (Signed)
Hemoptysis  Hemoptysis is when you cough up blood. It can be mild or serious. If it is mild, you may cough up bloody spit and mucus (sputum). If you cough up 1-2 cups (240-480 mL) of blood within 24 hours (massive hemoptysis), it is an emergency. If you cough up blood, it is important to go and see your doctor. Follow these instructions at home:  Watch your condition for any changes.  Take over-the-counter and prescription medicines only as told by your doctor.  If you were prescribed an antibiotic medicine, take it as told by your doctor. Do not stop taking the antibiotic even if you start to feel better.  Go back to your normal activities as told by your doctor. Ask your doctor what activities are safe for you to do.  Do not use any products that contain nicotine or tobacco. These include cigarettes and e-cigarettes. If you need help quitting, ask your doctor.  Keep all follow-up visits as told by your doctor. This is important. Contact a doctor if:  You have a fever.  You cough up bloody spit and mucus. Get help right away if:  You cough up fresh blood or blood clots.  You have trouble breathing.  You have chest pain. This information is not intended to replace advice given to you by your health care provider. Make sure you discuss any questions you have with your health care provider. Document Revised: 08/12/2017 Document Reviewed: 05/28/2016 Elsevier Patient Education  2020 [employer]vier Inc.  

## 2019-09-25 NOTE — Progress Notes (Signed)
CT chest reviewed  B/L pneumonia with hemoptysis  1.continue abx 2.oxygen as needed 3.can start prednisone 20 mg daily for 10 days 4.Hold oral Anticoagulation for now, can resume in 5-7 days if no more hemoptysis    Ayala Ribble Patricia Pesa, M.D.  [facility]  Medical Director [place] Director [facility] Cardio-Pulmonary Department

## 2019-09-25 NOTE — Care Management Obs Status (Signed)
[place] NOTIFICATION   Patient Details  Name: Daryl Weeks MRN: [id number] Date of Birth: September 04, 1961   Medicare Observation Status Notification Given:  Yes    Beverly Sessions, RN 09/25/2019, 1:58 PM

## 2019-09-25 NOTE — TOC Initial Note (Addendum)
Transition of Care [facility]) - Initial/Assessment Note    Patient Details  Name: Daryl Weeks MRN: [id number] Date of Birth: [birth year]  Transition of Care [facility]) CM/SW Contact:    Beverly Sessions, RN Phone Number: 09/25/2019, 2:28 PM  Clinical Narrative:                 Patient admitted from home with PNA.  Patient states that he lives at home alone.   Patient states that at baseline he is independent.   Drives himself to outpatient it  Sister will be taking him home today.   PCP [place]   Patient denies issues obtaining medications   MD to order home health RN.  Patient agreeable.  States that he recently has had [facility] and would like to see if they can accept him again.  Message left for [place] at [place].  Awaiting return call   Update:  Referral accepted by [facility]   Expected Discharge Plan: [place] Barriers to Discharge: No Barriers Identified   Patient Goals and CMS Choice Patient states their goals for this hospitalization and ongoing recovery are:: My sister is waiting on me, Im ready to get out of here CMS Medicare.gov Compare Post Acute Care list provided to:: Patient Choice offered to / list presented to : Patient  Expected Discharge Plan and Services Expected Discharge Plan: [place]     Post Acute Care Choice: Home Health   Expected Discharge Date: 09/25/19                         HH Arranged: RN          Prior Living Arrangements/Services   Lives with:: Self Patient language and need for interpreter reviewed:: Yes Do you feel safe going back to the place where you live?: Yes        Care giver support system in place?: Yes (comment)   Criminal Activity/Legal Involvement Pertinent to Current Situation/Hospitalization: No - Comment as needed  Activities of Daily Living Home Assistive Devices/Equipment: None ADL Screening (condition at time of admission) Patient's cognitive ability  adequate to safely complete daily activities?: No Is the patient deaf or have difficulty hearing?: No Does the patient have difficulty seeing, even when wearing glasses/contacts?: No Does the patient have difficulty concentrating, remembering, or making decisions?: Yes Patient able to express need for assistance with ADLs?: Yes Does the patient have difficulty dressing or bathing?: No Independently performs ADLs?: Yes (appropriate for developmental age) Does the patient have difficulty walking or climbing stairs?: Yes Weakness of Legs: Both Weakness of Arms/Hands: None  Permission Sought/Granted                  Emotional Assessment Appearance:: Appears stated age Attitude/Demeanor/Rapport: Gracious Affect (typically observed): Accepting Orientation: : Oriented to Self, Oriented to Place, Oriented to  Time, Oriented to Situation   Psych Involvement: No (comment)  Admission diagnosis:  Hyperkalemia [E87.5] Pneumonia [J18.9] Hemoptysis [R04.2] Community acquired pneumonia, unspecified laterality [J18.9] Left lower lobe pneumonia [J18.9] Patient Active Problem List   Diagnosis Date Noted  . Cough with hemoptysis 09/24/2019  . Atrial fibrillation, chronic ([place]) 09/24/2019  . Community acquired pneumonia 09/24/2019  . Hemoptysis 09/24/2019  . Complication of AV dialysis fistula 07/13/2019  . Left lower lobe pneumonia 05/28/2019  . Complication of vascular access for dialysis 11/28/2018  . COPD (chronic obstructive pulmonary disease) ([place]) 12/05/2017  . ESRD on dialysis (  [place]) 10/05/2016  . IgA nephropathy 11/05/2014  . S/P CABG x 4 11/29/2013  . Chronic diastolic heart failure ([place]) 11/16/2013  . Coronary artery disease   . Hyperlipidemia   . Hypertensive disorder   . Dyspnea 11/20/2012  . Tobacco abuse    PCP:  Olin Hauser, DO Pharmacy:   [facility] [address] [place] [address] Phone:  [phone] Fax: [phone]  [place] - [address] [place] [address] Phone: [phone] Fax: [phone]     Social Determinants of Health ([place]) Interventions    Readmission Risk Interventions Readmission Risk Prevention Plan 09/25/2019 07/17/2019 06/05/2019  Transportation Screening Complete Complete Complete  PCP or Specialist Appt within 3-5 Days Complete Complete Complete  HRI or Home Care Consult Complete Complete -  Social Work Consult for [place] Planning/Counseling - Complete -  Palliative Care Screening Not Applicable Not Applicable Not Applicable  Medication Review (RN Care Manager) Complete Complete Complete  Some recent data might be hidden

## 2019-09-25 NOTE — Progress Notes (Signed)
Left message with patient on his cell phone that he has a prescription for Prednisone to be picked up from the [facility] that he uses.

## 2019-09-25 NOTE — Progress Notes (Signed)
[facility]  ROUNDING NOTE   Subjective:   Patient states his cough has improved and denies any more hemoptysis.   Hemodialysis treatment yesterday. Tolerated treatment well. UF 2 liters   Objective:  Vital signs in last 24 hours:  Temp:  [97.2 F (36.2 C)-98.7 F (37.1 C)] 98.7 F (37.1 C) (01/12 0425) Pulse Rate:  [76-87] 80 (01/12 0425) Resp:  [15-20] 20 (01/12 0425) BP: (118-183)/(70-96) 169/84 (01/12 0937) SpO2:  [94 %-98 %] 95 % (01/12 0425) Weight:  [70.7 kg] 70.7 kg (01/12 0134)  Weight change: -1.876 kg Filed Weights   09/24/19 0407 09/25/19 0134  Weight: 72.6 kg 70.7 kg    Intake/Output: I/O last 3 completed shifts: In: 57 [P.O.:240; IV Piggyback:250] Out: 2000 [Other:2000]   Intake/Output this shift:  No intake/output data recorded.  Physical Exam: General: NAD,   Head: Normocephalic, atraumatic. Moist oral mucosal membranes  Eyes: Anicteric, PERRL  Neck: Supple, trachea midline  Lungs:  Clear to auscultation  Heart: Regular rate and rhythm  Abdomen:  Soft, nontender,   Extremities:  no peripheral edema.  Neurologic: Nonfocal, moving all four extremities  Skin: No lesions  Access: RIJ permcath    Basic Metabolic Panel: Recent Labs  Lab 09/24/19 0355  NA 129*  K 5.9*  CL 93*  CO2 21*  GLUCOSE 77  BUN 57*  CREATININE 10.66*  CALCIUM 10.2    Liver Function Tests: Recent Labs  Lab 09/24/19 0355  AST 17  ALT 8  ALKPHOS 82  BILITOT 0.8  PROT 6.9  ALBUMIN 3.3*   No results for input(s): LIPASE, AMYLASE in the last 168 hours. No results for input(s): AMMONIA in the last 168 hours.  CBC: Recent Labs  Lab 09/24/19 0355  WBC 8.4  NEUTROABS 5.2  HGB 12.7*  HCT 36.8*  MCV 98.9  PLT 180    Cardiac Enzymes: No results for input(s): CKTOTAL, CKMB, CKMBINDEX, TROPONINI in the last 168 hours.  BNP: Invalid input(s): POCBNP  CBG: No results for input(s): GLUCAP in the last 168 hours.  Microbiology: Results for  orders placed or performed during the hospital encounter of 09/24/19  Culture, blood (routine x 2)     Status: None (Preliminary result)   Collection Time: 09/24/19  4:00 AM   Specimen: BLOOD  Result Value Ref Range Status   Specimen Description BLOOD LEFT  Final   Special Requests   Final    BOTTLES DRAWN AEROBIC AND ANAEROBIC Blood Culture adequate volume   Culture   Final    NO GROWTH 1 DAY Performed at [facility], [address], [place], [place] [address]    Report Status PENDING  Incomplete  Culture, blood (routine x 2)     Status: None (Preliminary result)   Collection Time: 09/24/19  4:00 AM   Specimen: BLOOD  Result Value Ref Range Status   Specimen Description BLOOD RIGHT FA  Final   Special Requests   Final    BOTTLES DRAWN AEROBIC AND ANAEROBIC Blood Culture adequate volume   Culture   Final    NO GROWTH 1 DAY Performed at [facility], [address], [place], [place] [address]    Report Status PENDING  Incomplete  Respiratory Panel by RT PCR (Flu A&B, Covid) - Nasopharyngeal Swab     Status: None   Collection Time: 09/24/19  4:04 AM   Specimen: Nasopharyngeal Swab  Result Value Ref Range Status   SARS Coronavirus 2 by RT PCR NEGATIVE NEGATIVE Final  Comment: (NOTE) SARS-CoV-2 target nucleic acids are NOT DETECTED. The SARS-CoV-2 RNA is generally detectable in upper respiratoy specimens during the acute phase of infection. The lowest concentration of SARS-CoV-2 viral copies this assay can detect is 131 copies/mL. A negative result does not preclude SARS-Cov-2 infection and should not be used as the sole basis for treatment or other patient management decisions. A negative result may occur with  improper specimen collection/handling, submission of specimen other than nasopharyngeal swab, presence of viral mutation(s) within the areas targeted by this assay, and inadequate number of viral copies (<131 copies/mL). A negative result must be  combined with clinical observations, patient history, and epidemiological information. The expected result is Negative. Fact Sheet for Patients:  PinkCheek.be Fact Sheet for Healthcare Providers:  GravelBags.it This test is not yet ap proved or cleared by the [place] FDA and  has been authorized for detection and/or diagnosis of SARS-CoV-2 by FDA under an Emergency Use Authorization (EUA). This EUA will remain  in effect (meaning this test can be used) for the duration of the COVID-19 declaration under Section 564(b)(1) of the Act, 21 U.S.C. section 360bbb-3(b)(1), unless the authorization is terminated or revoked sooner.    Influenza A by PCR NEGATIVE NEGATIVE Final   Influenza B by PCR NEGATIVE NEGATIVE Final    Comment: (NOTE) The Xpert Xpress SARS-CoV-2/FLU/RSV assay is intended as an aid in  the diagnosis of influenza from Nasopharyngeal swab specimens and  should not be used as a sole basis for treatment. Nasal washings and  aspirates are unacceptable for Xpert Xpress SARS-CoV-2/FLU/RSV  testing. Fact Sheet for Patients: PinkCheek.be Fact Sheet for Healthcare Providers: GravelBags.it This test is not yet approved or cleared by the [place] FDA and  has been authorized for detection and/or diagnosis of SARS-CoV-2 by  FDA under an Emergency Use Authorization (EUA). This EUA will remain  in effect (meaning this test can be used) for the duration of the  Covid-19 declaration under Section 564(b)(1) of the Act, 21  U.S.C. section 360bbb-3(b)(1), unless the authorization is  terminated or revoked. Performed at [facility], [address] [place], [place] [address]   Sputum culture     Status: None   Collection Time: 09/24/19  4:04 AM   Specimen: Expectorated Sputum  Result Value Ref Range Status   Specimen Description EXPECTORATED SPUTUM   Final   Special Requests NONE  Final   Sputum evaluation   Final    THIS SPECIMEN IS ACCEPTABLE FOR SPUTUM CULTURE Performed at [facility], [address], [place], [place] [address]    Report Status 09/24/2019 FINAL  Final  Culture, respiratory     Status: None (Preliminary result)   Collection Time: 09/24/19  4:04 AM  Result Value Ref Range Status   Specimen Description   Final    EXPECTORATED SPUTUM Performed at [facility], [address] [place], [place] [address]    Special Requests   Final    NONE Reflexed from [id number] Performed at [facility], [address] [place], [place] [address]    Gram Stain   Final    NO WBC SEEN NO ORGANISMS SEEN Performed at [facility], [place] [address] [place], [place] [address]    Culture PENDING  Incomplete   Report Status PENDING  Incomplete    Coagulation Studies: Recent Labs    09/24/19 0355  LABPROT 15.3*  INR 1.2    Urinalysis: No results for input(s): COLORURINE, LABSPEC, PHURINE,  GLUCOSEU, HGBUR, BILIRUBINUR, KETONESUR, PROTEINUR, UROBILINOGEN, NITRITE, LEUKOCYTESUR in the last 72 hours.  Invalid input(s): APPERANCEUR    Imaging: CT Angio Chest PE W/Cm &/Or Wo Cm  Result Date: 09/24/2019 CLINICAL DATA:  Hemoptysis cough EXAM: CT ANGIOGRAPHY CHEST WITH CONTRAST TECHNIQUE: Multidetector CT imaging of the chest was performed using the standard protocol during bolus administration of intravenous contrast. Multiplanar CT image reconstructions and MIPs were obtained to evaluate the vascular anatomy. CONTRAST:  65mL OMNIPAQUE IOHEXOL 350 MG/ML SOLN COMPARISON:  Radiograph same day FINDINGS: Cardiovascular: There is a optimal opacification of the pulmonary arteries. There is no central,segmental, or subsegmental filling defects within the pulmonary arteries. There is moderate cardiomegaly. There is normal three-vessel brachiocephalic anatomy without proximal stenosis. The thoracic aorta is  normal in appearance. Coronary artery calcifications are seen. Overlying median sternotomy wires. Vascular stent seen within the right subclavian vein. Mediastinum/Nodes: No hilar, mediastinal, or axillary adenopathy. Thyroid gland, trachea, and esophagus demonstrate no significant findings. Lungs/Pleura: There is streaky/patchy airspace opacity seen at the posterior left lung base. There is streaky atelectasis or scarring within the right middle lobe. No pleural effusion. Upper Abdomen: Partially visualized low-density lesions seen within both kidneys. Musculoskeletal: No chest wall abnormality. No acute or significant osseous findings. Degenerative changes seen in the midthoracic spine. Review of the MIP images confirms the above findings. IMPRESSION: No central, segmental, or subsegmental pulmonary embolism. Patchy/streaky airspace opacity at the left lung base which could be due to atelectasis and/or infectious etiology. Electronically Signed   By: Prudencio Pair M.D.   On: 09/24/2019 04:47   DG Chest Port 1 View  Result Date: 09/24/2019 CLINICAL DATA:  Hemoptysis EXAM: PORTABLE CHEST 1 VIEW COMPARISON:  May 29, 2019 FINDINGS: Again noted is cardiomegaly. Overlying median sternotomy wires are present. A left-sided vascular stent is present. There is prominence of the central pulmonary vasculature. There is streaky opacity seen at both lung bases, right greater than left. Mildly increased interstitial markings throughout both lungs. No acute osseous abnormality IMPRESSION: Pulmonary vascular congestion and probable interstitial edema. Streaky opacity at both lung bases, likely subsegmental atelectasis Electronically Signed   By: Prudencio Pair M.D.   On: 09/24/2019 04:20     Medications:   . azithromycin 500 mg (09/25/19 0710)  . cefTRIAXone (ROCEPHIN)  IV 2 g (09/25/19 [id number])   . Chlorhexidine Gluconate Cloth  6 each Topical Q0600  . cinacalcet  60 mg Oral Once per day on Mon Wed Fri  . cloNIDine   0.2 mg Oral BID  . ferric citrate  420 mg Oral TID WC  . LORazepam  0-4 mg Intravenous Q6H  . sevelamer carbonate  2,400 mg Oral TID WC  . thiamine  100 mg Intravenous Daily     Assessment/ Plan:  Mr. CLABON LURIA is a 59 y.o. white male with end stage renal disease secondary to IgA nephropathy, rheumatoid arthritis, atrial fibrillation, coronary artery disease status post CABG, COPD, congestive heart failure who presents to [facility] on 09/24[facility]2019 for Hyperkalemia [E87.5] Pneumonia [J18.9] Hemoptysis [R04.2] Community acquired pneumonia, unspecified laterality [J18.9]  [facility] MWF [facility] RIJ permcath 68.5kg  1. End Stage Renal Disease: hemodialysis treatment yesterday.  - MWF schedule.   2. Hypertension: history of difficult to control blood pressures. Home regimen of amlodipine, irbesartan, furosemide, clonidine, hydralazine - Restart clonidine, hydralazine and amlodipine  3. Anemia of chronic kidney disease: hemoglobin 12.7. No indication for ESA.   4. Secondary Hyperparathyroidism: with hyperphosphatemia and hypercalcemia.  Lorin Picket and sevelamer -  Cinacalcet  5. Hemoptysis: negative CTA of chest. COVID-19 negative.  Thought to be secondary to pneumonia Holding apixaban.    LOS: 1 Reeve Turnley 1/12/202112:10 PM

## 2019-09-26 DIAGNOSIS — D509 Iron deficiency anemia, unspecified: Secondary | ICD-10-CM | POA: Diagnosis not present

## 2019-09-26 DIAGNOSIS — D631 Anemia in chronic kidney disease: Secondary | ICD-10-CM | POA: Diagnosis not present

## 2019-09-26 DIAGNOSIS — N186 End stage renal disease: Secondary | ICD-10-CM | POA: Diagnosis not present

## 2019-09-26 DIAGNOSIS — N2581 Secondary hyperparathyroidism of renal origin: Secondary | ICD-10-CM | POA: Diagnosis not present

## 2019-09-26 DIAGNOSIS — Z992 Dependence on renal dialysis: Secondary | ICD-10-CM | POA: Diagnosis not present

## 2019-09-26 DIAGNOSIS — E8779 Other fluid overload: Secondary | ICD-10-CM | POA: Diagnosis not present

## 2019-09-27 ENCOUNTER — Encounter (INDEPENDENT_AMBULATORY_CARE_PROVIDER_SITE_OTHER): Payer: Self-pay | Admitting: Vascular Surgery

## 2019-09-27 ENCOUNTER — Ambulatory Visit (INDEPENDENT_AMBULATORY_CARE_PROVIDER_SITE_OTHER): Payer: Medicare Other | Admitting: Vascular Surgery

## 2019-09-27 ENCOUNTER — Other Ambulatory Visit: Payer: Self-pay

## 2019-09-27 VITALS — BP 132/76 | HR 59 | Resp 15 | Wt 155.0 lb

## 2019-09-27 DIAGNOSIS — I482 Chronic atrial fibrillation, unspecified: Secondary | ICD-10-CM

## 2019-09-27 DIAGNOSIS — I2510 Atherosclerotic heart disease of native coronary artery without angina pectoris: Secondary | ICD-10-CM

## 2019-09-27 DIAGNOSIS — I10 Essential (primary) hypertension: Secondary | ICD-10-CM

## 2019-09-27 DIAGNOSIS — N186 End stage renal disease: Secondary | ICD-10-CM

## 2019-09-27 DIAGNOSIS — Z992 Dependence on renal dialysis: Secondary | ICD-10-CM

## 2019-09-27 DIAGNOSIS — T829XXD Unspecified complication of cardiac and vascular prosthetic device, implant and graft, subsequent encounter: Secondary | ICD-10-CM

## 2019-09-27 LAB — CULTURE, RESPIRATORY W GRAM STAIN
Culture: NORMAL
Gram Stain: NONE SEEN

## 2019-09-27 NOTE — Progress Notes (Signed)
MRN : [id number]  Daryl Weeks is a 59 y.o. (1960-10-31) male who presents with chief complaint of No chief complaint on file. [name]  History of Present Illness:    The patient is seen for evaluation of dialysis access.  The patient has a history of multiple failed accesses.  There have been multiple accesses.    He is s/p stenting in the left subclavian and he has a good cephalic vein  Current access is via a catheter which is functioning well.  There not been several episodes of catheter infection.  The patient denies amaurosis fugax or recent TIA symptoms. There are no recent neurological changes noted. The patient denies claudication symptoms or rest pain symptoms. The patient denies history of DVT, PE or superficial thrombophlebitis. The patient denies recent episodes of angina or shortness of breath.    No outpatient medications have been marked as taking for the 09/27/19 encounter (Appointment) with Delana Meyer, Dolores Lory, MD.    Past Medical History:  Diagnosis Date  . (HFpEF) heart failure with preserved ejection fraction ([place])    a. 05/2019 Echo: EF 50-55%, diast dysfxn, RVSP 56.67mmHg, Sev dil LA. Mildly dil PA.  [name] Anemia   . Anxiety   . Arthritis   . Colon polyps   . COPD (chronic obstructive pulmonary disease) ([place])   . Coronary artery disease    a. 10/2013 NSTEMI/Cath: Severe 3VD-->CABG x 4 @ [facility] 01/2014 (LIMA->LAD, VG->OM1->OM2, VG->RCA); b. 11/2014 MV: No isch/infarct; c. 05/2019 MV: EF 40% (50-55 by echo), small,mild, fixed apical defect - ? atten vs infarct. No ischemia->low risk.  . ESRD (end stage renal disease) ([place])    a. MWFSat HD  . ETOH abuse    a. 2 beers/night.  [name] GERD (gastroesophageal reflux disease)   . History of pneumonia   . Hyperlipidemia   . Hypertension   . IgA nephropathy   . Lower GI bleed    a. Due to colon polyps. Status post resection of 14 polyps  . Non-ST elevation MI (NSTEMI) ([place])   . PAF (paroxysmal atrial fibrillation) ([place])    a. Dx  05/2019. CHA2DS2VASc = 3-->Eliquis/Amio.  [name] Rheumatoid arthritis ([place])   . Shortness of breath   . Sleep apnea   . Tobacco abuse     Past Surgical History:  Procedure Laterality Date  . A/V FISTULAGRAM Left 10/28/2016   Procedure: A/V Fistulagram;  Surgeon: [name], MD;  Location: [place] [facility];  Service: Cardiovascular;  Laterality: Left;  . A/V FISTULAGRAM Left 12/07/2018   Procedure: A/V FISTULAGRAM;  Surgeon: [name], MD;  Location: [place] [facility];  Service: Cardiovascular;  Laterality: Left;  . A/V FISTULAGRAM N/A 06/04/2019   Procedure: A/V Fistulagram- LEFT;  Surgeon: [name], MD;  Location: [place] [facility];  Service: Cardiovascular;  Laterality: N/A;  . A/V SHUNT INTERVENTION N/A 10/28/2016   Procedure: A/V Shunt Intervention;  Surgeon: [name], MD;  Location: [place] [facility];  Service: Cardiovascular;  Laterality: N/A;  . A/V SHUNTOGRAM Left 02/01/2019   Procedure: A/V SHUNTOGRAM;  Surgeon: [name], MD;  Location: [place] [facility];  Service: Cardiovascular;  Laterality: Left;  . AV FISTULA PLACEMENT    . CARDIAC CATHETERIZATION     RCA 90% and calcified mid LAD 80% Stenosis  . CORONARY ARTERY BYPASS GRAFT N/A 11/29/2013   Procedure: CORONARY ARTERY BYPASS GRAFTING (CABG) x 4 using endoscopically harvested right saphenous vein and left internal mammary  artery;  Surgeon: [name], MD;  Location: [place];  Service: Open Heart Surgery;  Laterality: N/A;  . dialysis catheterr  2/15  . DIALYSIS/PERMA CATHETER INSERTION N/A 07/16/2019   Procedure: DIALYSIS/PERMA CATHETER INSERTION WITH VAC CHANGE UNDER SEDATION;  Surgeon: [name], MD;  Location: [place] [facility];  Service: Cardiovascular;  Laterality: N/A;  . DIALYSIS/PERMA CATHETER REMOVAL N/A 02/15/2019   Procedure: DIALYSIS/PERMA CATHETER REMOVAL;  Surgeon: [name], MD;  Location: [place] [facility];  Service: Cardiovascular;  Laterality: N/A;  . INSERTION OF DIALYSIS  CATHETER N/A 12/14/2018   Procedure: INSERTION OF DIALYSIS CATHETER ( [place] );  Surgeon: [name], MD;  Location: [facility];  Service: Vascular;  Laterality: N/A;  . INTRAOPERATIVE TRANSESOPHAGEAL ECHOCARDIOGRAM N/A 11/29/2013   Procedure: INTRAOPERATIVE TRANSESOPHAGEAL ECHOCARDIOGRAM;  Surgeon: [name], MD;  Location: [place] [facility];  Service: Open Heart Surgery;  Laterality: N/A;  . LIGATION OF ARTERIOVENOUS  FISTULA Left 07/13/2019   Procedure: LIGATION OF ARTERIOVENOUS  FISTULA;  Surgeon: [name], MD;  Location: [facility];  Service: Vascular;  Laterality: Left;  . PERIPHERAL VASCULAR CATHETERIZATION N/A 06/12/2015   Procedure: A/V Shuntogram/Fistulagram;  Surgeon: [name], MD;  Location: [place] [facility];  Service: Cardiovascular;  Laterality: N/A;  . PERIPHERAL VASCULAR CATHETERIZATION Left 06/12/2015   Procedure: A/V Shunt Intervention;  Surgeon: [name], MD;  Location: [place] [facility];  Service: Cardiovascular;  Laterality: Left;  . RENAL BIOPSY Left 14  . REVISON OF ARTERIOVENOUS FISTULA Left 12/14/2018   Procedure: REVISON OF ARTERIOVENOUS FISTULA;  Surgeon: [name], MD;  Location: [facility];  Service: Vascular;  Laterality: Left;  . UPPER EXTREMITY VENOGRAPHY Bilateral 09/18/2019   Procedure: UPPER EXTREMITY VENOGRAPHY;  Surgeon: Katha Cabal, MD;  Location: [place] [facility];  Service: Cardiovascular;  Laterality: Bilateral;    Social History Social History   Tobacco Use  . Smoking status: Current Every Day Smoker    Packs/day: 0.50    Years: 32.00    Pack years: 16.00    Types: Cigarettes  . Smokeless tobacco: Never Used  . Tobacco comment: daily  Substance Use Topics  . Alcohol use: Yes    Alcohol/week: 7.0 standard drinks    Types: 7 Cans of beer per week  . Drug use: No    Family History Family History  Problem Relation Age of Onset  . Heart disease Father   . Heart disease Brother   . Healthy Sister   . Stroke Neg Hx      Allergies  Allergen Reactions  . Lisinopril      REVIEW OF SYSTEMS (Negative unless checked)  Constitutional: [] Weight loss  [] Fever  [] Chills Cardiac: [] Chest pain   [] Chest pressure   [] Palpitations   [] Shortness of breath when laying flat   [] Shortness of breath with exertion. Vascular:  [] Pain in legs with walking   [] Pain in legs at rest  [] History of DVT   [] Phlebitis   [] Swelling in legs   [] Varicose veins   [] Non-healing ulcers Pulmonary:   [] Uses home oxygen   [] Productive cough   [] Hemoptysis   [] Wheeze  [] COPD   [] Asthma Neurologic:  [] Dizziness   [] Seizures   [] History of stroke   [] History of TIA  [] Aphasia   [] Vissual changes   [] Weakness or numbness in arm   [] Weakness or numbness in leg Musculoskeletal:   [] Joint swelling   [] Joint pain   [] Low back pain Hematologic:  []   Easy bruising  [] Easy bleeding   [] Hypercoagulable state   [] Anemic Gastrointestinal:  [] Diarrhea   [] Vomiting  [] Gastroesophageal reflux/heartburn   [] Difficulty swallowing. Genitourinary:  [x] Chronic kidney disease   [] Difficult urination  [] Frequent urination   [] Blood in urine Skin:  [] Rashes   [] Ulcers  Psychological:  [] History of anxiety   []  History of major depression.  Physical Examination  There were no vitals filed for this visit. There is no height or weight on file to calculate BMI. Gen: WD/WN, NAD Head: [place]/AT, No temporalis wasting.  Ear/Nose/Throat: Hearing grossly intact, nares w/o erythema or drainage Eyes: PER, EOMI, sclera nonicteric.  Neck: Supple, no large masses.   Pulmonary:  Good air movement, no audible wheezing bilaterally, no use of accessory muscles.  Cardiac: RRR, no JVD Vascular:  4 mm cephalic vein in the upper arm, some bruising of the elbow Vessel Right Left  Radial Palpable Palpable  Brachial Palpable Palpable  Gastrointestinal: Non-distended. No guarding/no peritoneal signs.  Musculoskeletal: M/S 5/5 throughout.  No deformity or atrophy.  Neurologic: CN  2-12 intact. Symmetrical.  Speech is fluent. Motor exam as listed above. Psychiatric: Judgment intact, Mood & affect appropriate for pt's clinical situation. Dermatologic: No rashes or ulcers noted.  No changes consistent with cellulitis. Lymph : No lichenification or skin changes of chronic lymphedema.  CBC Lab Results  Component Value Date   WBC 8.4 09/24/2019   HGB 12.7 (L) 09/24/2019   HCT 36.8 (L) 09/24/2019   MCV 98.9 09/24/2019   PLT 180 09/24/2019    BMET    Component Value Date/Time   NA 129 (L) 09/24/2019 0355   NA 132 (L) 06/24/2014 0903   K 5.9 (H) 09/24/2019 0355   K 4.8 07/11/2014 1046   CL 93 (L) 09/24/2019 0355   CL 96 (L) 06/24/2014 0903   CO2 21 (L) 09/24/2019 0355   CO2 21 06/24/2014 0903   GLUCOSE 77 09/24/2019 0355   GLUCOSE 92 06/24/2014 0903   BUN 57 (H) 09/24/2019 0355   BUN 60 (H) 06/24/2014 0903   CREATININE 10.66 (H) 09/24/2019 0355   CREATININE 9.10 (H) 06/24/2014 0903   CALCIUM 10.2 09/24/2019 0355   CALCIUM 8.8 06/24/2014 0903   GFRNONAA 5 (L) 09/24/2019 0355   GFRNONAA 7 (L) 06/24/2014 0903   GFRNONAA 11 (L) 01/14/2014 1051   GFRAA 6 (L) 09/24/2019 0355   GFRAA 8 (L) 06/24/2014 0903   GFRAA 12 (L) 01/14/2014 1051   Estimated Creatinine Clearance: 7.2 mL/min (A) (by C-G formula based on SCr of 10.66 mg/dL (H)).  COAG Lab Results  Component Value Date   INR 1.2 09/24/2019   INR 1.5 (H) 07/13/2019   INR 1.1 05/30/2019    Radiology CT Angio Chest PE W/Cm &/Or Wo Cm  Result Date: 09/24/2019 CLINICAL DATA:  Hemoptysis cough EXAM: CT ANGIOGRAPHY CHEST WITH CONTRAST TECHNIQUE: Multidetector CT imaging of the chest was performed using the standard protocol during bolus administration of intravenous contrast. Multiplanar CT image reconstructions and MIPs were obtained to evaluate the vascular anatomy. CONTRAST:  75mL OMNIPAQUE IOHEXOL 350 MG/ML SOLN COMPARISON:  Radiograph same day FINDINGS: Cardiovascular: There is a optimal opacification of  the pulmonary arteries. There is no central,segmental, or subsegmental filling defects within the pulmonary arteries. There is moderate cardiomegaly. There is normal three-vessel brachiocephalic anatomy without proximal stenosis. The thoracic aorta is normal in appearance. Coronary artery calcifications are seen. Overlying median sternotomy wires. Vascular stent seen within the right subclavian vein. Mediastinum/Nodes: No hilar, mediastinal, or axillary  adenopathy. Thyroid gland, trachea, and esophagus demonstrate no significant findings. Lungs/Pleura: There is streaky/patchy airspace opacity seen at the posterior left lung base. There is streaky atelectasis or scarring within the right middle lobe. No pleural effusion. Upper Abdomen: Partially visualized low-density lesions seen within both kidneys. Musculoskeletal: No chest wall abnormality. No acute or significant osseous findings. Degenerative changes seen in the midthoracic spine. Review of the MIP images confirms the above findings. IMPRESSION: No central, segmental, or subsegmental pulmonary embolism. Patchy/streaky airspace opacity at the left lung base which could be due to atelectasis and/or infectious etiology. Electronically Signed   By: Prudencio Pair M.D.   On: 09/24/2019 04:47   PERIPHERAL VASCULAR CATHETERIZATION  Result Date: 09/18/2019 See op note   DG Chest Port 1 View  Result Date: 09/24/2019 CLINICAL DATA:  Hemoptysis EXAM: PORTABLE CHEST 1 VIEW COMPARISON:  May 29, 2019 FINDINGS: Again noted is cardiomegaly. Overlying median sternotomy wires are present. A left-sided vascular stent is present. There is prominence of the central pulmonary vasculature. There is streaky opacity seen at both lung bases, right greater than left. Mildly increased interstitial markings throughout both lungs. No acute osseous abnormality IMPRESSION: Pulmonary vascular congestion and probable interstitial edema. Streaky opacity at both lung bases, likely  subsegmental atelectasis Electronically Signed   By: Prudencio Pair M.D.   On: 09/24/2019 04:20   UE Arterial Duplex Ltd (Pre-op AVF)  Result Date: 09/03/2019 UPPER EXTREMITY DUPLEX STUDY Indications: Patient complains of HX left HDA resected.  Performing Technologist: Concha Norway RVT  Examination Guidelines: A complete evaluation includes B-mode imaging, spectral Doppler, color Doppler, and power Doppler as needed of all accessible portions of each vessel. Bilateral testing is considered an integral part of a complete examination. Limited examinations for reoccurring indications may be performed as noted.  Right Pre-Dialysis Findings: +-----------------------+----------+--------------------+----------+--------+ Location               PSV (cm/s)Intralum. Diam. (cm)Waveform  Comments +-----------------------+----------+--------------------+----------+--------+ Brachial Antecub. fossa68        0.48                triphasic          +-----------------------+----------+--------------------+----------+--------+ Radial Art at Wrist    86        0.17                triphasic          +-----------------------+----------+--------------------+----------+--------+ Ulnar Art at Wrist     32        0.07                monophasic         +-----------------------+----------+--------------------+----------+--------+  Summary:  Right: Mild atherosclerosis in the Brachial, Radial, and Ulnar        arteries. *See table(s) above for measurements and observations. Electronically signed by Hortencia Pilar MD on 09/03/2019 at 5:25:51 PM.    Final    UE V-Mapping (Pre-op AVF)  Result Date: 09/03/2019 UPPER EXTREMITY VEIN MAPPING  Indications: Right. History: Failed left AVF.  Performing Technologist: Concha Norway RVT  Examination Guidelines: A complete evaluation includes B-mode imaging, spectral Doppler, color Doppler, and power Doppler as needed of all accessible portions of each vessel. Bilateral testing  is considered an integral part of a complete examination. Limited examinations for reoccurring indications may be performed as noted. +-----------------+-------------+----------+--------+ Right Cephalic   Diameter (cm)Depth (cm)Findings +-----------------+-------------+----------+--------+ Prox upper arm       0.13                        +-----------------+-------------+----------+--------+  Mid upper arm        0.16                        +-----------------+-------------+----------+--------+ Dist upper arm       0.16                        +-----------------+-------------+----------+--------+ Antecubital fossa    0.20                        +-----------------+-------------+----------+--------+ Prox forearm         0.19                        +-----------------+-------------+----------+--------+ Mid forearm          0.20                        +-----------------+-------------+----------+--------+ Dist forearm         0.19                        +-----------------+-------------+----------+--------+ +-----------------+-------------+----------+--------+ Right Basilic    Diameter (cm)Depth (cm)Findings +-----------------+-------------+----------+--------+ Prox upper arm       0.39                        +-----------------+-------------+----------+--------+ Mid upper arm        0.34                        +-----------------+-------------+----------+--------+ Dist upper arm       0.30                        +-----------------+-------------+----------+--------+ Antecubital fossa    0.28                        +-----------------+-------------+----------+--------+ Prox forearm         0.28                        +-----------------+-------------+----------+--------+ Summary: Right: Compressible veins. Poor diameter cephalic vein throughout.  *See table(s) above for measurements and observations.  Diagnosing physician: Hortencia Pilar MD Electronically signed  by Hortencia Pilar MD on 09/03/2019 at 5:25:49 PM.    Final       Assessment/Plan 1. Complication of arteriovenous dialysis fistula, subsequent encounter Recommend:  At this time the patient does not have appropriate extremity access for dialysis  Patient should have a left brachial cephalic fistula created.  The risks, benefits and alternative therapies were reviewed in detail with the patient.  All questions were answered.  The patient agrees to proceed with surgery.    2. ESRD on dialysis ([place]) At the present time the patient has adequate dialysis access.  Continue hemodialysis as ordered without interruption.  Avoid nephrotoxic medications and dehydration.  Further plans per nephrology  3. Essential hypertension Continue antihypertensive medications as already ordered, these medications have been reviewed and there are no changes at this time.   4. Coronary artery disease involving native coronary artery of native heart without angina pectoris Continue cardiac and antihypertensive medications as already ordered and reviewed, no changes at this time.  Continue statin as ordered and reviewed, no changes at this time  Nitrates PRN for chest pain   5.  Atrial fibrillation, chronic (HCC) Continue antiarrhythmia medications as already ordered, these medications have been reviewed and there are no changes at this time.  Continue anticoagulation as ordered by Cardiology Service     Hortencia Pilar, MD  09/27/2019 9:59 AM

## 2019-09-27 NOTE — Discharge Summary (Signed)
[facility] NAME: Daryl Weeks    MR#:  [id number]  DATE OF BIRTH:  April 14, 1961  DATE OF ADMISSION:  09/24/2019   ADMITTING PHYSICIAN: Max Sane, MD  DATE OF DISCHARGE: 09/25/2019  3:02 PM  PRIMARY CARE PHYSICIAN: Olin Hauser, DO   ADMISSION DIAGNOSIS:  Hyperkalemia [E87.5] Pneumonia [J18.9] Hemoptysis [R04.2] Community acquired pneumonia, unspecified laterality [J18.9] Left lower lobe pneumonia [J18.9] DISCHARGE DIAGNOSIS:  Principal Problem:   Left lower lobe pneumonia Active Problems:   Coronary artery disease   Chronic diastolic heart failure (HCC)   ESRD on dialysis (HCC)   COPD (chronic obstructive pulmonary disease) (HCC)   Cough with hemoptysis   Atrial fibrillation, chronic ([place])   Community acquired pneumonia   Hemoptysis  SECONDARY DIAGNOSIS:   Past Medical History:  Diagnosis Date  . (HFpEF) heart failure with preserved ejection fraction ([place])    a. 05/2019 Echo: EF 50-55%, diast dysfxn, RVSP 56.43mmHg, Sev dil LA. Mildly dil PA.  [name] Anemia   . Anxiety   . Arthritis   . Colon polyps   . COPD (chronic obstructive pulmonary disease) ([place])   . Coronary artery disease    a. 10/2013 NSTEMI/Cath: Severe 3VD-->CABG x 4 @ [facility] 01/2014 (LIMA->LAD, VG->OM1->OM2, VG->RCA); b. 11/2014 MV: No isch/infarct; c. 05/2019 MV: EF 40% (50-55 by echo), small,mild, fixed apical defect - ? atten vs infarct. No ischemia->low risk.  . ESRD (end stage renal disease) ([place])    a. MWFSat HD  . ETOH abuse    a. 2 beers/night.  [name] GERD (gastroesophageal reflux disease)   . History of pneumonia   . Hyperlipidemia   . Hypertension   . IgA nephropathy   . Lower GI bleed    a. Due to colon polyps. Status post resection of 14 polyps  . Non-ST elevation MI (NSTEMI) ([place])   . PAF (paroxysmal atrial fibrillation) ([place])    a. Dx 05/2019. CHA2DS2VASc = 3-->Eliquis/Amio.  [name] Rheumatoid arthritis ([place])   . Shortness of breath   . Sleep apnea   .  Tobacco abuse    HOSPITAL COURSE:  [name] is a 59 y.o. white male with end stage renal disease secondary to IgA nephropathy, rheumatoid arthritis, atrial fibrillation, coronary artery disease status post CABG, COPD, congestive heart failure who presents to [facility] 09/24/2019 for Hyperkalemia & hemoptysis   pneumonia -ruled out. Afebrile, leukocytosis. CT chest more consistent with atelectesis  Hemoptysis - Secondary to coughing, pulmo recommends prednisone for 10 days so I've sent over scripts  -Hold apixaban for 2-3 days and may be longer if he has more hemoptysis.  - he had no hemoptysis while in the hospital  Coronary artery disease -No complaints of chest pain Continue home meds.   Chronic diastolic heart failure (HCC) Appears euvolemic Continue home meds  ESRD on dialysis [facility]) Patient on dialysis Monday Wednesday Friday Nephrology following for dialysis needs [facility] MWF [facility] permcath   COPD (chronic obstructive pulmonary disease) ([place]) Not acutely exacerbated Continue home meds when reconciled  Atrial fibrillation, chronic (HCC) Continue amiodarone - hold eliquis due to hemoptysis, resume in next 2-3 days  Hyperkalemia Improved with dialysis DISCHARGE CONDITIONS:  stable CONSULTS OBTAINED:   DRUG ALLERGIES:   Allergies  Allergen Reactions  . Lisinopril    DISCHARGE MEDICATIONS:   Allergies as of 09/25/2019      Reactions   Lisinopril       Medication List    STOP taking  these medications   irbesartan 300 MG tablet Commonly known as: AVAPRO     TAKE these medications   amiodarone 200 MG tablet Commonly known as: PACERONE Take 200 mg by mouth 2 (two) times daily. Notes to patient: Next dose Tuesday evening   amLODipine 10 MG tablet Commonly known as: NORVASC take 1 tablet by mouth once daily What changed:   how much to take  how to take this  when to take this  additional instructions Notes to  patient: Take Wednesday morning   apixaban 2.5 MG Tabs tablet Commonly known as: Eliquis Take 1 tablet (2.5 mg total) by mouth 2 (two) times daily.   aspirin 81 MG EC tablet Take 1 tablet (81 mg total) by mouth daily.   atorvastatin 20 MG tablet Commonly known as: LIPITOR take 1 tablet by mouth at bedtime What changed:   how much to take  how to take this  when to take this  additional instructions   Auryxia 1 GM 210 MG(Fe) tablet Generic drug: ferric citrate Take 420 mg by mouth 3 (three) times daily with meals. Notes to patient: Take next dose today with dinner   calcium carbonate 500 MG chewable tablet Commonly known as: TUMS - dosed in mg elemental calcium Chew 1 tablet by mouth daily as needed for indigestion or heartburn.   carvedilol 25 MG tablet Commonly known as: COREG Take 1 tablet (25 mg total) by mouth 2 (two) times daily with a meal. Notes to patient: Next dose is today with dinner   cloNIDine 0.2 MG tablet Commonly known as: CATAPRES Take 1 tablet (0.2 mg total) by mouth 3 (three) times daily.   docusate sodium 100 MG capsule Commonly known as: COLACE Take 1 capsule (100 mg total) by mouth 2 (two) times daily as needed for mild constipation.   furosemide 80 MG tablet Commonly known as: LASIX Take 80 mg by mouth 2 (two) times daily. Notes to patient: Take next dose this evening   gabapentin 100 MG capsule Commonly known as: NEURONTIN Take 100 mg by mouth 3 (three) times daily. Notes to patient: Take next dose tonight   hydrALAZINE 50 MG tablet Commonly known as: APRESOLINE Take 75 mg by mouth 3 (three) times daily. Notes to patient: Take next dose this evening   HYDROcodone-acetaminophen 5-325 MG tablet Commonly known as: NORCO/VICODIN Take 1 tablet by mouth at bedtime.   loratadine 10 MG tablet Commonly known as: CLARITIN Take 1 tablet (10 mg total) by mouth daily.   multivitamin Tabs tablet Take 1 tablet by mouth daily.     ondansetron 4 MG disintegrating tablet Commonly known as: ZOFRAN-ODT Take 4 mg by mouth 2 (two) times daily as needed for nausea.   predniSONE 10 MG tablet Commonly known as: DELTASONE Take 1 tablet (10 mg total) by mouth daily.   Pulmicort 0.5 MG/2ML nebulizer solution Generic drug: budesonide Take 2 mLs (0.5 mg total) by nebulization every 12 (twelve) hours.   budesonide 180 MCG/ACT inhaler Commonly known as: Pulmicort Flexhaler Inhale 1 puff into the lungs 2 (two) times daily.   Sensipar 30 MG tablet Generic drug: cinacalcet Take 30 mg by mouth daily with supper.   tiotropium 18 MCG inhalation capsule Commonly known as: SPIRIVA Place 1 capsule (18 mcg total) into inhaler and inhale daily. What changed: when to take this   Ventolin HFA 108 (90 Base) MCG/ACT inhaler Generic drug: albuterol Inhale 2 puffs into the lungs every 4 (four) hours as needed for wheezing or  shortness of breath.   zolpidem 10 MG tablet Commonly known as: AMBIEN Take 10 mg by mouth at bedtime.      DISCHARGE INSTRUCTIONS:   DIET:  Regular diet DISCHARGE CONDITION:  Stable ACTIVITY:  Activity as tolerated OXYGEN:  Home Oxygen: No.  Oxygen Delivery: room air DISCHARGE LOCATION:  home   If you experience worsening of your admission symptoms, develop shortness of breath, life threatening emergency, suicidal or homicidal thoughts you must seek medical attention immediately by calling 911 or calling your MD immediately  if symptoms less severe.  You Must read complete instructions/literature along with all the possible adverse reactions/side effects for all the Medicines you take and that have been prescribed to you. Take any new Medicines after you have completely understood and accpet all the possible adverse reactions/side effects.   Please note  You were cared for by a hospitalist during your hospital stay. If you have any questions about your discharge medications or the care you  received while you were in the hospital after you are discharged, you can call the unit and asked to speak with the hospitalist on call if the hospitalist that took care of you is not available. Once you are discharged, your primary care physician will handle any further medical issues. Please note that NO REFILLS for any discharge medications will be authorized once you are discharged, as it is imperative that you return to your primary care physician (or establish a relationship with a primary care physician if you do not have one) for your aftercare needs so that they can reassess your need for medications and monitor your lab values.    On the day of Discharge:  VITAL SIGNS:  Blood pressure (!) 191/88, pulse 79, temperature 98 F (36.7 C), temperature source Oral, resp. rate 19, height 5\' 7"  (1.702 m), weight 70.7 kg, SpO2 91 %. PHYSICAL EXAMINATION:  GENERAL:  59 y.o.-year-old patient lying in the bed with no acute distress.  EYES: Pupils equal, round, reactive to light and accommodation. No scleral icterus. Extraocular muscles intact.  HEENT: Head atraumatic, normocephalic. Oropharynx and nasopharynx clear.  NECK:  Supple, no jugular venous distention. No thyroid enlargement, no tenderness.  LUNGS: Normal breath sounds bilaterally, no wheezing, rales,rhonchi or crepitation. No use of accessory muscles of respiration.  CARDIOVASCULAR: S1, S2 normal. No murmurs, rubs, or gallops.  ABDOMEN: Soft, non-tender, non-distended. Bowel sounds present. No organomegaly or mass.  EXTREMITIES: No pedal edema, cyanosis, or clubbing.  NEUROLOGIC: Cranial nerves II through XII are intact. Muscle strength 5/5 in all extremities. Sensation intact. Gait not checked.  PSYCHIATRIC: The patient is alert and oriented x 3.  SKIN: No obvious rash, lesion, or ulcer.  DATA REVIEW:   CBC Recent Labs  Lab 09/24/19 0355  WBC 8.4  HGB 12.7*  HCT 36.8*  PLT 180    Chemistries  Recent Labs  Lab 09/24/19 0355   NA 129*  K 5.9*  CL 93*  CO2 21*  GLUCOSE 77  BUN 57*  CREATININE 10.66*  CALCIUM 10.2  AST 17  ALT 8  ALKPHOS 82  BILITOT 0.8     Follow-up Information    Olin Hauser, DO. Schedule an appointment as soon as possible for a visit on 10/02/2019.   Specialty: Family Medicine Why: need to call and make a 1 week follow up appointment with primary doctor  Contact information: [place] [address] [phone]        Wellington Hampshire, MD.  Schedule an appointment as soon as possible for a visit on 10/09/2019.   Specialty: Cardiology Why: 1:30 Contact information: [place] [address] [phone]        Flora Lipps, MD. Schedule an appointment as soon as possible for a visit on 10/11/2019.   Specialties: Pulmonary Disease, Cardiology Why: 10:30 Contact information: [place] [address] [phone]           Management plans discussed with the patient, family and they are in agreement.  CODE STATUS: Prior   TOTAL TIME TAKING CARE OF THIS PATIENT: 45 minutes.    Max Sane M.D on 09/27/2019 at 1:36 PM  Between 7am to 6pm - Pager - [phone]  After 6pm go to [email or web address] - password [email or web address]  [facility]   CC: Primary care physician; Olin Hauser, DO   Note: This dictation was prepared with Dragon dictation along with smaller phrase technology. Any transcriptional errors that result from this process are unintentional.

## 2019-09-28 DIAGNOSIS — D509 Iron deficiency anemia, unspecified: Secondary | ICD-10-CM | POA: Diagnosis not present

## 2019-09-28 DIAGNOSIS — E8779 Other fluid overload: Secondary | ICD-10-CM | POA: Diagnosis not present

## 2019-09-28 DIAGNOSIS — N186 End stage renal disease: Secondary | ICD-10-CM | POA: Diagnosis not present

## 2019-09-28 DIAGNOSIS — D631 Anemia in chronic kidney disease: Secondary | ICD-10-CM | POA: Diagnosis not present

## 2019-09-28 DIAGNOSIS — Z992 Dependence on renal dialysis: Secondary | ICD-10-CM | POA: Diagnosis not present

## 2019-09-28 DIAGNOSIS — N2581 Secondary hyperparathyroidism of renal origin: Secondary | ICD-10-CM | POA: Diagnosis not present

## 2019-09-29 DIAGNOSIS — J189 Pneumonia, unspecified organism: Secondary | ICD-10-CM | POA: Diagnosis not present

## 2019-09-29 DIAGNOSIS — N186 End stage renal disease: Secondary | ICD-10-CM | POA: Diagnosis not present

## 2019-09-29 DIAGNOSIS — E1122 Type 2 diabetes mellitus with diabetic chronic kidney disease: Secondary | ICD-10-CM | POA: Diagnosis not present

## 2019-09-29 DIAGNOSIS — Z7901 Long term (current) use of anticoagulants: Secondary | ICD-10-CM | POA: Diagnosis not present

## 2019-09-29 DIAGNOSIS — Z87891 Personal history of nicotine dependence: Secondary | ICD-10-CM | POA: Diagnosis not present

## 2019-09-29 DIAGNOSIS — J449 Chronic obstructive pulmonary disease, unspecified: Secondary | ICD-10-CM | POA: Diagnosis not present

## 2019-09-29 DIAGNOSIS — M47814 Spondylosis without myelopathy or radiculopathy, thoracic region: Secondary | ICD-10-CM | POA: Diagnosis not present

## 2019-09-29 DIAGNOSIS — Z7982 Long term (current) use of aspirin: Secondary | ICD-10-CM | POA: Diagnosis not present

## 2019-09-29 DIAGNOSIS — G4730 Sleep apnea, unspecified: Secondary | ICD-10-CM | POA: Diagnosis not present

## 2019-09-29 DIAGNOSIS — F419 Anxiety disorder, unspecified: Secondary | ICD-10-CM | POA: Diagnosis not present

## 2019-09-29 DIAGNOSIS — D631 Anemia in chronic kidney disease: Secondary | ICD-10-CM | POA: Diagnosis not present

## 2019-09-29 DIAGNOSIS — I132 Hypertensive heart and chronic kidney disease with heart failure and with stage 5 chronic kidney disease, or end stage renal disease: Secondary | ICD-10-CM | POA: Diagnosis not present

## 2019-09-29 DIAGNOSIS — Z951 Presence of aortocoronary bypass graft: Secondary | ICD-10-CM | POA: Diagnosis not present

## 2019-09-29 DIAGNOSIS — J181 Lobar pneumonia, unspecified organism: Secondary | ICD-10-CM | POA: Diagnosis not present

## 2019-09-29 DIAGNOSIS — E785 Hyperlipidemia, unspecified: Secondary | ICD-10-CM | POA: Diagnosis not present

## 2019-09-29 DIAGNOSIS — I5032 Chronic diastolic (congestive) heart failure: Secondary | ICD-10-CM | POA: Diagnosis not present

## 2019-09-29 DIAGNOSIS — Z992 Dependence on renal dialysis: Secondary | ICD-10-CM | POA: Diagnosis not present

## 2019-09-29 DIAGNOSIS — M069 Rheumatoid arthritis, unspecified: Secondary | ICD-10-CM | POA: Diagnosis not present

## 2019-09-29 DIAGNOSIS — Z9181 History of falling: Secondary | ICD-10-CM | POA: Diagnosis not present

## 2019-09-29 DIAGNOSIS — K219 Gastro-esophageal reflux disease without esophagitis: Secondary | ICD-10-CM | POA: Diagnosis not present

## 2019-09-29 DIAGNOSIS — I2510 Atherosclerotic heart disease of native coronary artery without angina pectoris: Secondary | ICD-10-CM | POA: Diagnosis not present

## 2019-09-29 DIAGNOSIS — I252 Old myocardial infarction: Secondary | ICD-10-CM | POA: Diagnosis not present

## 2019-09-29 DIAGNOSIS — Z86010 Personal history of colonic polyps: Secondary | ICD-10-CM | POA: Diagnosis not present

## 2019-09-29 DIAGNOSIS — Z9861 Coronary angioplasty status: Secondary | ICD-10-CM | POA: Diagnosis not present

## 2019-09-29 DIAGNOSIS — I480 Paroxysmal atrial fibrillation: Secondary | ICD-10-CM | POA: Diagnosis not present

## 2019-09-29 LAB — CULTURE, BLOOD (ROUTINE X 2)
Culture: NO GROWTH
Culture: NO GROWTH
Special Requests: ADEQUATE
Special Requests: ADEQUATE

## 2019-10-01 DIAGNOSIS — N2581 Secondary hyperparathyroidism of renal origin: Secondary | ICD-10-CM | POA: Diagnosis not present

## 2019-10-01 DIAGNOSIS — D631 Anemia in chronic kidney disease: Secondary | ICD-10-CM | POA: Diagnosis not present

## 2019-10-01 DIAGNOSIS — E8779 Other fluid overload: Secondary | ICD-10-CM | POA: Diagnosis not present

## 2019-10-01 DIAGNOSIS — D509 Iron deficiency anemia, unspecified: Secondary | ICD-10-CM | POA: Diagnosis not present

## 2019-10-01 DIAGNOSIS — Z992 Dependence on renal dialysis: Secondary | ICD-10-CM | POA: Diagnosis not present

## 2019-10-01 DIAGNOSIS — N186 End stage renal disease: Secondary | ICD-10-CM | POA: Diagnosis not present

## 2019-10-03 DIAGNOSIS — N186 End stage renal disease: Secondary | ICD-10-CM | POA: Diagnosis not present

## 2019-10-03 DIAGNOSIS — D631 Anemia in chronic kidney disease: Secondary | ICD-10-CM | POA: Diagnosis not present

## 2019-10-03 DIAGNOSIS — Z992 Dependence on renal dialysis: Secondary | ICD-10-CM | POA: Diagnosis not present

## 2019-10-03 DIAGNOSIS — E8779 Other fluid overload: Secondary | ICD-10-CM | POA: Diagnosis not present

## 2019-10-03 DIAGNOSIS — N2581 Secondary hyperparathyroidism of renal origin: Secondary | ICD-10-CM | POA: Diagnosis not present

## 2019-10-03 DIAGNOSIS — D509 Iron deficiency anemia, unspecified: Secondary | ICD-10-CM | POA: Diagnosis not present

## 2019-10-04 DIAGNOSIS — D631 Anemia in chronic kidney disease: Secondary | ICD-10-CM | POA: Diagnosis not present

## 2019-10-04 DIAGNOSIS — E1122 Type 2 diabetes mellitus with diabetic chronic kidney disease: Secondary | ICD-10-CM | POA: Diagnosis not present

## 2019-10-04 DIAGNOSIS — I5032 Chronic diastolic (congestive) heart failure: Secondary | ICD-10-CM | POA: Diagnosis not present

## 2019-10-04 DIAGNOSIS — J181 Lobar pneumonia, unspecified organism: Secondary | ICD-10-CM | POA: Diagnosis not present

## 2019-10-04 DIAGNOSIS — N186 End stage renal disease: Secondary | ICD-10-CM | POA: Diagnosis not present

## 2019-10-04 DIAGNOSIS — I132 Hypertensive heart and chronic kidney disease with heart failure and with stage 5 chronic kidney disease, or end stage renal disease: Secondary | ICD-10-CM | POA: Diagnosis not present

## 2019-10-05 DIAGNOSIS — D509 Iron deficiency anemia, unspecified: Secondary | ICD-10-CM | POA: Diagnosis not present

## 2019-10-05 DIAGNOSIS — Z992 Dependence on renal dialysis: Secondary | ICD-10-CM | POA: Diagnosis not present

## 2019-10-05 DIAGNOSIS — E8779 Other fluid overload: Secondary | ICD-10-CM | POA: Diagnosis not present

## 2019-10-05 DIAGNOSIS — N186 End stage renal disease: Secondary | ICD-10-CM | POA: Diagnosis not present

## 2019-10-05 DIAGNOSIS — N2581 Secondary hyperparathyroidism of renal origin: Secondary | ICD-10-CM | POA: Diagnosis not present

## 2019-10-05 DIAGNOSIS — D631 Anemia in chronic kidney disease: Secondary | ICD-10-CM | POA: Diagnosis not present

## 2019-10-06 DIAGNOSIS — N186 End stage renal disease: Secondary | ICD-10-CM | POA: Diagnosis not present

## 2019-10-06 DIAGNOSIS — D631 Anemia in chronic kidney disease: Secondary | ICD-10-CM | POA: Diagnosis not present

## 2019-10-06 DIAGNOSIS — Z992 Dependence on renal dialysis: Secondary | ICD-10-CM | POA: Diagnosis not present

## 2019-10-06 DIAGNOSIS — N2581 Secondary hyperparathyroidism of renal origin: Secondary | ICD-10-CM | POA: Diagnosis not present

## 2019-10-06 DIAGNOSIS — D509 Iron deficiency anemia, unspecified: Secondary | ICD-10-CM | POA: Diagnosis not present

## 2019-10-06 DIAGNOSIS — E8779 Other fluid overload: Secondary | ICD-10-CM | POA: Diagnosis not present

## 2019-10-08 DIAGNOSIS — Z992 Dependence on renal dialysis: Secondary | ICD-10-CM | POA: Diagnosis not present

## 2019-10-08 DIAGNOSIS — D509 Iron deficiency anemia, unspecified: Secondary | ICD-10-CM | POA: Diagnosis not present

## 2019-10-08 DIAGNOSIS — E8779 Other fluid overload: Secondary | ICD-10-CM | POA: Diagnosis not present

## 2019-10-08 DIAGNOSIS — D631 Anemia in chronic kidney disease: Secondary | ICD-10-CM | POA: Diagnosis not present

## 2019-10-08 DIAGNOSIS — N186 End stage renal disease: Secondary | ICD-10-CM | POA: Diagnosis not present

## 2019-10-08 DIAGNOSIS — N2581 Secondary hyperparathyroidism of renal origin: Secondary | ICD-10-CM | POA: Diagnosis not present

## 2019-10-09 ENCOUNTER — Ambulatory Visit (INDEPENDENT_AMBULATORY_CARE_PROVIDER_SITE_OTHER): Payer: Medicare Other

## 2019-10-09 ENCOUNTER — Ambulatory Visit (INDEPENDENT_AMBULATORY_CARE_PROVIDER_SITE_OTHER): Payer: Medicare Other | Admitting: Physician Assistant

## 2019-10-09 ENCOUNTER — Other Ambulatory Visit: Payer: Self-pay

## 2019-10-09 ENCOUNTER — Encounter: Payer: Self-pay | Admitting: Physician Assistant

## 2019-10-09 VITALS — BP 120/70 | HR 63 | Ht 67.0 in | Wt 150.8 lb

## 2019-10-09 DIAGNOSIS — Z951 Presence of aortocoronary bypass graft: Secondary | ICD-10-CM

## 2019-10-09 DIAGNOSIS — I10 Essential (primary) hypertension: Secondary | ICD-10-CM | POA: Diagnosis not present

## 2019-10-09 DIAGNOSIS — I5032 Chronic diastolic (congestive) heart failure: Secondary | ICD-10-CM | POA: Diagnosis not present

## 2019-10-09 DIAGNOSIS — Z992 Dependence on renal dialysis: Secondary | ICD-10-CM

## 2019-10-09 DIAGNOSIS — Z7289 Other problems related to lifestyle: Secondary | ICD-10-CM

## 2019-10-09 DIAGNOSIS — Z8709 Personal history of other diseases of the respiratory system: Secondary | ICD-10-CM | POA: Diagnosis not present

## 2019-10-09 DIAGNOSIS — Z87898 Personal history of other specified conditions: Secondary | ICD-10-CM

## 2019-10-09 DIAGNOSIS — D638 Anemia in other chronic diseases classified elsewhere: Secondary | ICD-10-CM

## 2019-10-09 DIAGNOSIS — Z720 Tobacco use: Secondary | ICD-10-CM | POA: Diagnosis not present

## 2019-10-09 DIAGNOSIS — T829XXD Unspecified complication of cardiac and vascular prosthetic device, implant and graft, subsequent encounter: Secondary | ICD-10-CM | POA: Diagnosis not present

## 2019-10-09 DIAGNOSIS — I2510 Atherosclerotic heart disease of native coronary artery without angina pectoris: Secondary | ICD-10-CM

## 2019-10-09 DIAGNOSIS — I482 Chronic atrial fibrillation, unspecified: Secondary | ICD-10-CM

## 2019-10-09 DIAGNOSIS — E785 Hyperlipidemia, unspecified: Secondary | ICD-10-CM

## 2019-10-09 DIAGNOSIS — N186 End stage renal disease: Secondary | ICD-10-CM

## 2019-10-09 DIAGNOSIS — Z789 Other specified health status: Secondary | ICD-10-CM

## 2019-10-09 DIAGNOSIS — R17 Unspecified jaundice: Secondary | ICD-10-CM

## 2019-10-09 NOTE — Patient Instructions (Signed)
Medication Instructions:  1- DECREASE Amiodarone take 1 tablet (200 mg total) by mouth once daily 2- STOP Aspirin *If you need a refill on your cardiac medications before your next appointment, please call your pharmacy*  Lab Work: Your physician recommends that you have lab work today(TSH, CMET, CBC)  If you have labs (blood work) drawn today and your tests are completely normal, you will receive your results only by: [name] MyChart Message (if you have MyChart) OR . A paper copy in the mail If you have any lab test that is abnormal or we need to change your treatment, we will call you to review the results.  Testing/Procedures: 1-A zio monitor was placed today. It will remain on for 14 days. You will then return monitor and event diary in provided box. It takes 1-2 weeks for report to be downloaded and returned to [place]. We will call you with the results. If monitor falls of or has orange flashing light, please call Zio for further instructions.     Follow-Up: At [facility], you and your health needs are our priority.  As part of our continuing mission to provide you with exceptional heart care, we have created designated Provider Care Teams.  These Care Teams include your primary Cardiologist (physician) and Advanced Practice Providers (APPs -  Physician Assistants and Nurse Practitioners) who all work together to provide you with the care you need, when you need it.  Your next appointment:   2 week(s)  The format for your next appointment:   In Person  Provider:    You may see Kathlyn Sacramento, MD or Marrianne Mood, PA-C.

## 2019-10-09 NOTE — Progress Notes (Signed)
Office Visit    Patient Name: Daryl Weeks Date of Encounter: 10/09/2019  Primary Care Provider:  Olin Hauser, DO Primary Cardiologist:  Kathlyn Sacramento, MD  Chief Complaint    59 year old male with history of CAD s/p four-vessel bypass in 2015, newly diagnosed Afib with RVR on reduced dose OAC, end-stage renal disease 2/2 IgA nephropathy on dialysis 4 days a week (M, W, F, Sa), s/p stenting in the L subclavian with multiple failed accesses, hypertension, hyperlipidemia, HFpEF (EF 50 to 55% 05/2019), anemia of chronic disease, COPD, RA, anxiety, arthritis, sleep apnea, and ongoing tobacco and alcohol abuse, and who is being seen today for 2-week hospital follow-up.  Past Medical History    Past Medical History:  Diagnosis Date  . (HFpEF) heart failure with preserved ejection fraction ([place])    a. 05/2019 Echo: EF 50-55%, diast dysfxn, RVSP 56.100mmHg, Sev dil LA. Mildly dil PA.  [name] Anemia   . Anxiety   . Arthritis   . Colon polyps   . COPD (chronic obstructive pulmonary disease) ([place])   . Coronary artery disease    a. 10/2013 NSTEMI/Cath: Severe 3VD-->CABG x 4 @ [facility] 01/2014 (LIMA->LAD, VG->OM1->OM2, VG->RCA); b. 11/2014 MV: No isch/infarct; c. 05/2019 MV: EF 40% (50-55 by echo), small,mild, fixed apical defect - ? atten vs infarct. No ischemia->low risk.  . ESRD (end stage renal disease) ([place])    a. MWFSat HD  . ETOH abuse    a. 2 beers/night.  [name] GERD (gastroesophageal reflux disease)   . History of pneumonia   . Hyperlipidemia   . Hypertension   . IgA nephropathy   . Lower GI bleed    a. Due to colon polyps. Status post resection of 14 polyps  . Non-ST elevation MI (NSTEMI) ([place])   . PAF (paroxysmal atrial fibrillation) ([place])    a. Dx 05/2019. CHA2DS2VASc = 3-->Eliquis/Amio.  [name] Rheumatoid arthritis ([place])   . Shortness of breath   . Sleep apnea   . Tobacco abuse    Past Surgical History:  Procedure Laterality Date  . A/V FISTULAGRAM Left 10/28/2016   Procedure:  A/V Fistulagram;  Surgeon: [name], MD;  Location: [place] [facility];  Service: Cardiovascular;  Laterality: Left;  . A/V FISTULAGRAM Left 12/07/2018   Procedure: A/V FISTULAGRAM;  Surgeon: [name], MD;  Location: [place] [facility];  Service: Cardiovascular;  Laterality: Left;  . A/V FISTULAGRAM N/A 06/04/2019   Procedure: A/V Fistulagram- LEFT;  Surgeon: [name], MD;  Location: [place] [facility];  Service: Cardiovascular;  Laterality: N/A;  . A/V SHUNT INTERVENTION N/A 10/28/2016   Procedure: A/V Shunt Intervention;  Surgeon: [name], MD;  Location: [place] [facility];  Service: Cardiovascular;  Laterality: N/A;  . A/V SHUNTOGRAM Left 02/01/2019   Procedure: A/V SHUNTOGRAM;  Surgeon: [name], MD;  Location: [place] [facility];  Service: Cardiovascular;  Laterality: Left;  . AV FISTULA PLACEMENT    . CARDIAC CATHETERIZATION     RCA 90% and calcified mid LAD 80% Stenosis  . CORONARY ARTERY BYPASS GRAFT N/A 11/29/2013   Procedure: CORONARY ARTERY BYPASS GRAFTING (CABG) x 4 using endoscopically harvested right saphenous vein and left internal mammary artery;  Surgeon: [name], MD;  Location: [place] [facility];  Service: Open Heart Surgery;  Laterality: N/A;  . dialysis catheterr  2/15  . DIALYSIS/PERMA CATHETER INSERTION N/A 07/16/2019   Procedure: DIALYSIS/PERMA CATHETER INSERTION WITH VAC CHANGE UNDER SEDATION;  Surgeon: Lucky Cowboy,  Erskine Squibb, MD;  Location: [place] [facility];  Service: Cardiovascular;  Laterality: N/A;  . DIALYSIS/PERMA CATHETER REMOVAL N/A 02/15/2019   Procedure: DIALYSIS/PERMA CATHETER REMOVAL;  Surgeon: [name], MD;  Location: [place] [facility];  Service: Cardiovascular;  Laterality: N/A;  . INSERTION OF DIALYSIS CATHETER N/A 12/14/2018   Procedure: INSERTION OF DIALYSIS CATHETER ( [place] );  Surgeon: [name], MD;  Location: [facility];  Service: Vascular;  Laterality: N/A;  . INTRAOPERATIVE TRANSESOPHAGEAL ECHOCARDIOGRAM N/A 11/29/2013    Procedure: INTRAOPERATIVE TRANSESOPHAGEAL ECHOCARDIOGRAM;  Surgeon: [name], MD;  Location: [place] [facility];  Service: Open Heart Surgery;  Laterality: N/A;  . LIGATION OF ARTERIOVENOUS  FISTULA Left 07/13/2019   Procedure: LIGATION OF ARTERIOVENOUS  FISTULA;  Surgeon: [name], MD;  Location: [facility];  Service: Vascular;  Laterality: Left;  . PERIPHERAL VASCULAR CATHETERIZATION N/A 06/12/2015   Procedure: A/V Shuntogram/Fistulagram;  Surgeon: [name], MD;  Location: [place] [facility];  Service: Cardiovascular;  Laterality: N/A;  . PERIPHERAL VASCULAR CATHETERIZATION Left 06/12/2015   Procedure: A/V Shunt Intervention;  Surgeon: [name], MD;  Location: [place] [facility];  Service: Cardiovascular;  Laterality: Left;  . RENAL BIOPSY Left 14  . REVISON OF ARTERIOVENOUS FISTULA Left 12/14/2018   Procedure: REVISON OF ARTERIOVENOUS FISTULA;  Surgeon: [name], MD;  Location: [facility];  Service: Vascular;  Laterality: Left;  . UPPER EXTREMITY VENOGRAPHY Bilateral 09/18/2019   Procedure: UPPER EXTREMITY VENOGRAPHY;  Surgeon: Katha Cabal, MD;  Location: [place] [facility];  Service: Cardiovascular;  Laterality: Bilateral;    Allergies  Allergies  Allergen Reactions  . Lisinopril     History of Present Illness    59 year old male with PMH as above.  In 2015, he had a NSTEMI with catheterization revealing severe multivessel disease.  Echo showed normal LV function at that time.  He was referred to CT surgery and underwent CABG x4 01/2014.  His last ischemic evaluation was in 2016, as he was being evaluated at [facility] for renal transplant.  Stress testing was nonischemic.  He was last seen by our office 06/2017 and has been lost to follow-up since that time.  He reportedly lives locally and continues to smoke half pack a day and drink at least 2 beers every evening.  At his last hospital admission, he reported some degree of chronic DOE and intermittent chest discomfort.  He was  admitted 05/2019 with 2 recent falls, unsteadiness, slurred speech, DOE, and anxiety which caused him to pace the floor all night. He reportedly had gone to dialysis and was sent to the ED as he was noted to be in Afib with RVR, hypertensive, hypoxic, and hyperkalemic.C hest x-ray was concerning for lower lobe infiltrates versus edema. MRI negative for any acute infarcts but showed chronic lacunar infarcts in the left frontal lobe white matter and left basal ganglia. He was transferred to the ICU for Afib with RVR and rates noted in the 130s to 150s.  He complained of chest discomfort but was otherwise asymptomatic and hemodynamically stable. He did note a h/o palpitations.  Stress test was performed and without significant ischemia. Echo showed nl EF.  Before discharge, he was transitioned to oral BB, amiodarone, and Eliquis 5mg  BID.    He has had a h/o multiple failed access attempts for dialysis. In 06/2019, he underwent ligation of the native portion of AV fistula with excision of the entire Artegraft and surrounding infected hematoma. In  07/2019, he underwent ultrasound guidance for vascular access to R internal jugular vein with right jugular venogram / superior venacavogram. He had fluoroscopic guidance for placement of catheter and tunneled hemodialysis catheter via R IJV. Occluded R internal jugular at the level of the clavicle was noted. He was discharged on Eliquis 5mg  BID. When seen in the vascular surgery office on 12/21, however, his Eliquis had been reduced to 2.5mg  BID with patient report today that [facility] was reduced by nephrology between appointments. Given his RUE showed small veins not suitable for fistula creation, he then underwent 09/17/18 venograms/angiography to determine if new access could be created in the L arm. His L subclavian vein demonstrated greater than 70% stenosis within the L subclavian and at the confluence of the L innominate. Stent placement of the L subclavian vein was  performed, as well as introduction catheter into the left internal jugular vein. He was discharged on Eliquis 2.5mg  BID.   On 09/23/18, he presented to the [facility] ED with report of hemoptysis for the last 30-45 minutes. He was noted to again be hyperkalemic on admission. Critical care was consulted with suspicion of pna; however, discharge summary notes that this dx was r/o as afebrile and CT more consistent with atelectasis. It was thus thought his hemoptysis was 2/2 coughing with recommendation for prednisone for 10 days at discharge. He was also instructed to hold [facility] for an additional 2-3 days after discharge or longer if more hemoptysis.    Since that time, the patient reports that he has been in a poor state of health. He reports that he bruises easily but denies any further hemoptysis since his hospitalization. No s/sx of acute GIB; however, he does note BRBPR, which he attributes to his hemorrhoids. . He denies further CP. No racing HR or palpitations. He reports stable DOE and worsening fatigue. No reported orthopnea, PND, or abdominal distention. He does note he is restless at night, which he attributes to anxiety. He has not been checking his weight or blood pressure but did note that a home health nurse has checked his BP though unable to recall these values. He continues to smoke with estimated 1/2 pk daily. He notes alcohol use at 2 beers per week. He reports compliance with HD on M, W, F, Sa. He states that his BP sometimes drops as low as 70/50 with HD; however, he does not note any presyncope or syncope/LOC with this drop.    Home Medications    Prior to Admission medications   Medication Sig Start Date End Date Taking? Authorizing Provider  albuterol (VENTOLIN HFA) 108 (90 Base) MCG/ACT inhaler Inhale 2 puffs into the lungs every 4 (four) hours as needed for wheezing or shortness of breath.    Yes [provider]  amiodarone (PACERONE) 200 MG tablet Take 200 mg by mouth 2 (two)  times daily.  09/02/19  Yes [provider]  amLODipine (NORVASC) 10 MG tablet take 1 tablet by mouth once daily Patient taking differently: Take 10 mg by mouth at bedtime.  06/29/16  Yes Wellington Hampshire, MD  apixaban (ELIQUIS) 2.5 MG TABS tablet Take 1 tablet (2.5 mg total) by mouth 2 (two) times daily. Patient taking differently: Take 2.5 mg by mouth daily.  09/27/19  Yes Max Sane, MD  aspirin 81 MG EC tablet Take 1 tablet (81 mg total) by mouth daily. 09/27/19  Yes Max Sane, MD  atorvastatin (LIPITOR) 20 MG tablet take 1 tablet by mouth at bedtime Patient taking  differently: Take 20 mg by mouth at bedtime.  04/25/14  Yes Gollan, Kathlene November, MD  AURYXIA 1 GM 210 MG(Fe) tablet Take 420 mg by mouth 3 (three) times daily with meals.    Yes [provider]  budesonide (PULMICORT FLEXHALER) 180 MCG/ACT inhaler Inhale 1 puff into the lungs 2 (two) times daily. 02/21/18  Yes Mikey College, NP  budesonide (PULMICORT) 0.5 MG/2ML nebulizer solution Take 2 mLs (0.5 mg total) by nebulization every 12 (twelve) hours. 06/03/17  Yes Laverle Hobby, MD  calcium carbonate (TUMS - DOSED IN MG ELEMENTAL CALCIUM) 500 MG chewable tablet Chew 1 tablet by mouth daily as needed for indigestion or heartburn.    Yes [provider]  carvedilol (COREG) 25 MG tablet Take 1 tablet (25 mg total) by mouth 2 (two) times daily with a meal. 06/05/19  Yes Gouru, Aruna, MD  cinacalcet (SENSIPAR) 30 MG tablet Take 30 mg by mouth daily with supper.    Yes [provider]  cloNIDine (CATAPRES) 0.2 MG tablet Take 1 tablet (0.2 mg total) by mouth 3 (three) times daily. 06/05/19  Yes Gouru, Illene Silver, MD  docusate sodium (COLACE) 100 MG capsule Take 1 capsule (100 mg total) by mouth 2 (two) times daily as needed for mild constipation. 06/05/19  Yes Gouru, Illene Silver, MD  furosemide (LASIX) 80 MG tablet Take 80 mg by mouth 2 (two) times daily.    Yes [provider]  gabapentin (NEURONTIN)  100 MG capsule Take 100 mg by mouth 3 (three) times daily.  01/14/18  Yes [provider]  hydrALAZINE (APRESOLINE) 50 MG tablet Take 75 mg by mouth 3 (three) times daily.    Yes [provider]  HYDROcodone-acetaminophen (NORCO/VICODIN) 5-325 MG tablet Take 1 tablet by mouth at bedtime.   Yes [provider]  loratadine (CLARITIN) 10 MG tablet Take 1 tablet (10 mg total) by mouth daily. 06/06/19  Yes Gouru, Illene Silver, MD  multivitamin (RENA-VIT) TABS tablet Take 1 tablet by mouth daily.  05/22/18  Yes [provider]  ondansetron (ZOFRAN-ODT) 4 MG disintegrating tablet Take 4 mg by mouth 2 (two) times daily as needed for nausea.   Yes [provider]  tiotropium (SPIRIVA) 18 MCG inhalation capsule Place 1 capsule (18 mcg total) into inhaler and inhale daily. Patient taking differently: Place 18 mcg into inhaler and inhale every evening.  05/24/17  Yes Laverle Hobby, MD  zolpidem (AMBIEN) 10 MG tablet Take 10 mg by mouth at bedtime. 07/20/19  Yes [provider]    Review of Systems    He denies chest pain, palpitations, pnd, orthopnea, n, v, dizziness, syncope, edema, weight gain, or early satiety. He reports stable DOE and fatigue.  All other systems reviewed and are otherwise negative except as noted above.  Physical Exam    VS:  Ht 5\' 7"  (1.702 m)   Wt 150 lb 12 oz (68.4 kg)   BMI 23.61 kg/m  , BMI Body mass index is 23.61 kg/m. GEN: Thin and slightly jaundiced male in no acute distress. HEENT: bilateral yellow sclera / jaundice Neck: Supple, bandages noted on R side of neck that patient reports 2/2 vascular procedure, ecchymosis noted on neck and chest, no masses. Cardiac: RRR, no murmurs, rubs, or gallops. No clubbing, cyanosis, edema.  Radials/DP/PT 2+ and equal bilaterally. L arm fistula noted.  Respiratory:  Respirations regular and unlabored, b/l wheezing, reduced bilateral breath sounds, trace bibasilar crackles. GI: Soft,  nontender, nondistended, BS + x 4. MS: no  deformity or atrophy.  Skin: warm and dry, no rash. Chest ecchymosis noted. Jaundice Neuro:  Strength and sensation are intact. Psych: Normal affect.  Accessory Clinical Findings    ECG personally reviewed by me today -  - SR with first degree AVB, 63bpm, LVH with repolarization changes and QRS widening at 13ms, PRi 216mso acute changes.  Filed Weights   10/09/19 1321  Weight: 150 lb 12 oz (68.4 kg)  Wt 77.5kg 170 lbs  Echo 05/29/2019 1. The left ventricle has low normal systolic function, with an ejection fraction of 50-55%. The cavity size was normal. There is moderately increased left ventricular wall thickness. Left ventricular diastolic Doppler parameters are consistent with  pseudonormalization. Elevated mean left atrial pressure No evidence of left ventricular regional wall motion abnormalities. 2. The right ventricle has moderately reduced systolic function. The cavity was mildly enlarged. There is no increase in right ventricular wall thickness. Right ventricular systolic pressure is moderately elevated with an estimated pressure of 56.7  mmHg. 3. Left atrial size was severely dilated. 4. The aortic valve is tricuspid. 5. The aorta is normal unless otherwise noted. 6. Mildly dilated pulmonary artery. 7. The inferior vena cava was dilated in size with <50% respiratory variability. 8. The interatrial septum was not well visualized.  NM Study 04/30/2017 Pharmacological myocardial perfusion imaging study with no significant ischemia Small mild fixed apical perfusion defect, unable to exclude attenuation artifact vs small prior MI. Normal wall motion, EF estimated at 40% (decreased EF secondary to GI uptake artifact) No EKG changes concerning for ischemia at peak stress or in recovery. Baseline EKG with ST abn in V5 and V6, more pronounced in V4 to V6 with lexiscan infusion Low risk scan  Sodium 129, potassium 5.9, creatinine  10.66, BUN 57, AST 17, ALT 8, WBC 8.4, hemoglobin 12.7, hematocrit 36.8   Assessment & Plan    Paroxysmal Afib with RVR First Degree AVB / Widening QRS --Remains in NSR with newly noted first degree AVB. Symptomatic in Afib with heart racing and some palpitations. Denies any current or recent CP or palpitations/racing HR. Does note stable DOE, which is likely multifactorial in the setting of COPD, ESRD, and elevated R heart pressures. He remains in SR on EKG with newly noted first degree AVB and widening QRS.  Will need to keep a close eye on this widening QRS and monitor for any new or worsening sx with most recent CV studies as above. Echo with low normal EF and severe LAE. Stress test low risk. Most recent TSH wnl. CHA2DS2VASc score of at least 5 with recommendation for long term [place]. He is currently on reduced dose Eliquis per nephrology at 2.5mg  daily per nephrology.  --Given his history of hemoptysis with ongoing fatigue and DOE, check CBC. Obtain 2 week Zio to quantify burden of Afib and need for ongoing [place], as well as rule out any other additional rhythms not yet captured. Continue current reduced dose Eliquis 2.5mg  qd for now. Continue Coreg 25mg  BID. He appears jaundiced today with amiodarone reduced to 200mg  daily and recheck of liver function. Consider holding statin as well if LFTs elevated.   Hemoptysis with known anemia of chronic dz --Recent hospitalization for hemoptysis thought likely 2/2 pna by critical care and 2/2 cough per discharge summary. No further hemoptysis since discharge.  Stop ASA given hemoptysis. Check CBC with consideration of known anemia of chronic dz. Will also check a Zio as above to reassess need for [facility].   Jaundice --Noted on  today's exam, specifically with yellow bilateral sclera. Check liver function. Consider further decrease / discontinuation of statin +/- amiodarone based on lab results and further workup as indicated.  CAD/unstable angina / elevated  HS Tn --No current chest pain and stable DOE. Previous CP in the setting of Afib. Wt down from previous hospital weight and he reports continued fatigue. 9/18 stress test ruled low risk.  Echo 9/15 with low normal LV function and elevated right heart pressures / signs of right heart strain.  EKG shows widening QRS with recommendation to reassess at follow-up for new or worsening sx. Continue statin for now and BB. Stop ASA as above.   HFpEF --Appears euvolemic on exam with chronic DOE. Blood pressures better controlled on Coreg. CHF education discussed in detail with lifestyle changes recommended and to include smoking cessation, reduced alcohol, and low intake of salt. Also recommended is increased activity, daily weights, and daily BP checks to monitor volume status.Continue lasix, hydralazine, clonidine, Coreg, and amlodipine.  Further volume management per nephrology / dialysis.   HTN --Reasonably well controlled on Coreg. Continue with clonidine, amlodipine, hydralazine, and Lasix. Check BMET / electrolytes.   HLD --Continue statin therapy for now and pending LFTs.  Tobacco use, Alcohol use --Cessation advised. He was noted to be taken of the kidney transplant list at [facility] due to tobacco use, which has been discussed at previous office visits.   ESRD with anemia of chronic dz --Dialysis M, W, F, Saturday as outpatient with low dose EPO. Check CBC as above given hemoptysis with consideration of baseline low Hgb.   Patient requested preop cardiac evaluation before unknown vascular procedure --Unclear when patient will have his upcoming reported vascular procedure; however, he stated today that he needed general cardiology clearance in order to schedule it. Given his comorbid conditions and need for further workup and evaluation as above, as well as that his upcoming procedure is unknown, will defer recommendation / preoperative cardiac evaluation at this time and reassess at  follow-up.  ----- Disposition: Stop ASA. Check CBC and continue reduced dose Eliquis 2.5mg  daily per nephrology for now and pending CBC results with low threshold to discontinue with any further s/sx of bleeding. Check CMET today, so that we are also able to assess LFTs given jaundice on exam. Decrease amiodarone to 200mg  daily. Ordered 2 wk Zio to assess burden of PAF given his comorbid conditions and need to reduce and possibly discontinue his [facility] +/- amiodarone based on labs and further symptoms. If LFTs abnormal, consider holding his statin as well. Recommend follow-up with nephrology. RTC 2 weeks.   Arvil Chaco, PA-C 10/09/2019, 1:25 PM

## 2019-10-10 ENCOUNTER — Other Ambulatory Visit
Admission: RE | Admit: 2019-10-10 | Discharge: 2019-10-10 | Disposition: A | Discharge status: Home / Self Care | Payer: Medicare Other | Source: Ambulatory Visit | Attending: Nephrology | Admitting: Nephrology

## 2019-10-10 DIAGNOSIS — D509 Iron deficiency anemia, unspecified: Secondary | ICD-10-CM | POA: Diagnosis not present

## 2019-10-10 DIAGNOSIS — N186 End stage renal disease: Secondary | ICD-10-CM | POA: Diagnosis not present

## 2019-10-10 DIAGNOSIS — N2581 Secondary hyperparathyroidism of renal origin: Secondary | ICD-10-CM | POA: Diagnosis not present

## 2019-10-10 DIAGNOSIS — D631 Anemia in chronic kidney disease: Secondary | ICD-10-CM | POA: Diagnosis not present

## 2019-10-10 DIAGNOSIS — Z992 Dependence on renal dialysis: Secondary | ICD-10-CM | POA: Diagnosis not present

## 2019-10-10 DIAGNOSIS — E8779 Other fluid overload: Secondary | ICD-10-CM | POA: Diagnosis not present

## 2019-10-10 LAB — COMPREHENSIVE METABOLIC PANEL
ALT: 11 IU/L (ref 0–44)
AST: 16 IU/L (ref 0–40)
Albumin/Globulin Ratio: 1.3 (ref 1.2–2.2)
Albumin: 3.7 g/dL — ABNORMAL LOW (ref 3.8–4.9)
Alkaline Phosphatase: 99 IU/L (ref 39–117)
BUN/Creatinine Ratio: 5 — ABNORMAL LOW (ref 9–20)
BUN: 40 mg/dL — ABNORMAL HIGH (ref 6–24)
Bilirubin Total: 0.2 mg/dL (ref 0.0–1.2)
CO2: 21 mmol/L (ref 20–29)
Calcium: 10.3 mg/dL — ABNORMAL HIGH (ref 8.7–10.2)
Chloride: 92 mmol/L — ABNORMAL LOW (ref 96–106)
Creatinine, Ser: 8.53 mg/dL — ABNORMAL HIGH (ref 0.76–1.27)
GFR calc Af Amer: 7 mL/min/{1.73_m2} — ABNORMAL LOW (ref >59)
GFR calc non Af Amer: 6 mL/min/{1.73_m2} — ABNORMAL LOW (ref >59)
Globulin, Total: 2.9 g/dL (ref 1.5–4.5)
Glucose: 108 mg/dL — ABNORMAL HIGH (ref 65–99)
Potassium: 5.5 mmol/L — ABNORMAL HIGH (ref 3.5–5.2)
Sodium: 132 mmol/L — ABNORMAL LOW (ref 134–144)
Total Protein: 6.6 g/dL (ref 6.0–8.5)

## 2019-10-10 LAB — CBC
Hematocrit: 35.3 % — ABNORMAL LOW (ref 37.5–51.0)
Hemoglobin: 12 g/dL — ABNORMAL LOW (ref 13.0–17.7)
MCH: 34.2 pg — ABNORMAL HIGH (ref 26.6–33.0)
MCHC: 34 g/dL (ref 31.5–35.7)
MCV: 101 fL — ABNORMAL HIGH (ref 79–97)
Platelets: 330 10*3/uL (ref 150–450)
RBC: 3.51 x10E6/uL — ABNORMAL LOW (ref 4.14–5.80)
RDW: 13.2 % (ref 11.6–15.4)
WBC: 12 10*3/uL — ABNORMAL HIGH (ref 3.4–10.8)

## 2019-10-10 LAB — TSH: TSH: 1.23 u[IU]/mL (ref 0.450–4.500)

## 2019-10-10 LAB — POTASSIUM: Potassium: 4.3 mmol/L (ref 3.5–5.1)

## 2019-10-11 ENCOUNTER — Other Ambulatory Visit: Payer: Self-pay

## 2019-10-11 ENCOUNTER — Encounter: Payer: Self-pay | Admitting: Pulmonary Disease

## 2019-10-11 ENCOUNTER — Ambulatory Visit: Payer: Medicare Other | Admitting: Pulmonary Disease

## 2019-10-11 VITALS — BP 132/68 | HR 59 | Temp 97.0°F | Ht 67.0 in | Wt 153.8 lb

## 2019-10-11 DIAGNOSIS — N186 End stage renal disease: Secondary | ICD-10-CM | POA: Diagnosis not present

## 2019-10-11 DIAGNOSIS — I482 Chronic atrial fibrillation, unspecified: Secondary | ICD-10-CM

## 2019-10-11 DIAGNOSIS — D631 Anemia in chronic kidney disease: Secondary | ICD-10-CM | POA: Diagnosis not present

## 2019-10-11 DIAGNOSIS — I132 Hypertensive heart and chronic kidney disease with heart failure and with stage 5 chronic kidney disease, or end stage renal disease: Secondary | ICD-10-CM | POA: Diagnosis not present

## 2019-10-11 DIAGNOSIS — J181 Lobar pneumonia, unspecified organism: Secondary | ICD-10-CM | POA: Diagnosis not present

## 2019-10-11 DIAGNOSIS — J449 Chronic obstructive pulmonary disease, unspecified: Secondary | ICD-10-CM

## 2019-10-11 DIAGNOSIS — E1122 Type 2 diabetes mellitus with diabetic chronic kidney disease: Secondary | ICD-10-CM | POA: Diagnosis not present

## 2019-10-11 DIAGNOSIS — Z992 Dependence on renal dialysis: Secondary | ICD-10-CM

## 2019-10-11 DIAGNOSIS — I5032 Chronic diastolic (congestive) heart failure: Secondary | ICD-10-CM

## 2019-10-11 NOTE — Patient Instructions (Signed)
He will be placed on an inhaler called Trelegy Ellipta 1 inhalation daily  You can continue using your emergency inhaler (L-shaped inhaler) as needed  We will see you in follow-up in 3 months time call sooner should any new difficulties arise.

## 2019-10-11 NOTE — Progress Notes (Signed)
 Assessment & Plan:  1. Moderate COPD (chronic obstructive pulmonary disease) (HCC) (Primary)  2. ESRD on dialysis (HCC)  3. Chronic diastolic heart failure (HCC)  4. Atrial fibrillation, chronic (HCC)   Patient Instructions  He will be placed on an inhaler called Trelegy Ellipta  1 inhalation daily  You can continue using your emergency inhaler (L-shaped inhaler) as needed  We will see you in follow-up in 3 months time call sooner should any new difficulties arise.  Please note: late entry documentation due to logistical difficulties during COVID-19 pandemic. This note is filed for information purposes only, and is not intended to be used for billing, nor does it represent the full scope/nature of the visit in question. Please see any associated scanned media linked to date of encounter for additional pertinent information.  Subjective:    HPI: Daryl Weeks is a 59 y.o. male presenting to the pulmonology clinic on 10/11/2019 with report of: Hospitalization Follow-up (previous pr pt- Pt denies any SOB, cough, wheezing, fever or chills. He was seen in the hospital for spitting up blood.)     Outpatient Encounter Medications as of 10/11/2019  Medication Sig   atorvastatin  (LIPITOR) 20 MG tablet take 1 tablet by mouth at bedtime (Patient taking differently: Take 20 mg by mouth every morning. )   AURYXIA  1 GM 210 MG(Fe) tablet Take 420 mg by mouth 3 (three) times daily with meals.    calcium  carbonate (TUMS - DOSED IN MG ELEMENTAL CALCIUM ) 500 MG chewable tablet Chew 1 tablet by mouth daily as needed for indigestion or heartburn.    cinacalcet  (SENSIPAR ) 30 MG tablet Take 30 mg by mouth daily with supper.    docusate sodium  (COLACE) 100 MG capsule Take 1 capsule (100 mg total) by mouth 2 (two) times daily as needed for mild constipation.   furosemide  (LASIX ) 80 MG tablet Take 80 mg by mouth 2 (two) times daily.    gabapentin  (NEURONTIN ) 100 MG capsule Take 100 mg by mouth 3  (three) times daily.    loratadine  (CLARITIN ) 10 MG tablet Take 1 tablet (10 mg total) by mouth daily. (Patient taking differently: Take 10 mg by mouth every morning. )   ondansetron  (ZOFRAN -ODT) 4 MG disintegrating tablet Take 4 mg by mouth every 8 (eight) hours as needed for nausea.    [DISCONTINUED] albuterol  (VENTOLIN  HFA) 108 (90 Base) MCG/ACT inhaler Inhale 2 puffs into the lungs every 4 (four) hours as needed for wheezing or shortness of breath.    [DISCONTINUED] amiodarone  (PACERONE ) 200 MG tablet Take 200 mg by mouth every morning.    [DISCONTINUED] amLODipine  (NORVASC ) 10 MG tablet take 1 tablet by mouth once daily (Patient taking differently: Take 10 mg by mouth at bedtime. )   [DISCONTINUED] apixaban  (ELIQUIS ) 2.5 MG TABS tablet Take 1 tablet (2.5 mg total) by mouth 2 (two) times daily. (Patient taking differently: Take 2.5 mg by mouth daily. )   [DISCONTINUED] budesonide  (PULMICORT  FLEXHALER) 180 MCG/ACT inhaler Inhale 1 puff into the lungs 2 (two) times daily.   [DISCONTINUED] budesonide  (PULMICORT ) 0.5 MG/2ML nebulizer solution Take 2 mLs (0.5 mg total) by nebulization every 12 (twelve) hours.   [DISCONTINUED] carvedilol  (COREG ) 25 MG tablet Take 1 tablet (25 mg total) by mouth 2 (two) times daily with a meal.   [DISCONTINUED] cloNIDine  (CATAPRES ) 0.2 MG tablet Take 1 tablet (0.2 mg total) by mouth 3 (three) times daily.   [DISCONTINUED] hydrALAZINE  (APRESOLINE ) 100 MG tablet Take 50 mg by mouth 3 (three) times  daily.    [DISCONTINUED] HYDROcodone -acetaminophen  (NORCO/VICODIN) 5-325 MG tablet Take 1 tablet by mouth at bedtime.   [DISCONTINUED] multivitamin (RENA-VIT) TABS tablet Take 1 tablet by mouth daily.    [DISCONTINUED] tiotropium (SPIRIVA ) 18 MCG inhalation capsule Place 1 capsule (18 mcg total) into inhaler and inhale daily. (Patient taking differently: Place 18 mcg into inhaler and inhale every evening. )   [DISCONTINUED] zolpidem  (AMBIEN ) 10 MG tablet Take 10 mg by mouth at  bedtime.   No facility-administered encounter medications on file as of 10/11/2019.      Objective:   Vitals:   10/11/19 1031  BP: 132/68  Pulse: (!) 59  Temp: (!) 97 F (36.1 C)  Height: 5' 7 (1.702 m)  Weight: 153 lb 12.8 oz (69.8 kg)  SpO2: 97% Comment: on ra  TempSrc: Temporal  BMI (Calculated): 24.08     Physical exam documentation is limited by delayed entry of information.

## 2019-10-12 DIAGNOSIS — E8779 Other fluid overload: Secondary | ICD-10-CM | POA: Diagnosis not present

## 2019-10-12 DIAGNOSIS — D509 Iron deficiency anemia, unspecified: Secondary | ICD-10-CM | POA: Diagnosis not present

## 2019-10-12 DIAGNOSIS — Z992 Dependence on renal dialysis: Secondary | ICD-10-CM | POA: Diagnosis not present

## 2019-10-12 DIAGNOSIS — E1122 Type 2 diabetes mellitus with diabetic chronic kidney disease: Secondary | ICD-10-CM | POA: Diagnosis not present

## 2019-10-12 DIAGNOSIS — I5032 Chronic diastolic (congestive) heart failure: Secondary | ICD-10-CM | POA: Diagnosis not present

## 2019-10-12 DIAGNOSIS — D631 Anemia in chronic kidney disease: Secondary | ICD-10-CM | POA: Diagnosis not present

## 2019-10-12 DIAGNOSIS — J181 Lobar pneumonia, unspecified organism: Secondary | ICD-10-CM | POA: Diagnosis not present

## 2019-10-12 DIAGNOSIS — N2581 Secondary hyperparathyroidism of renal origin: Secondary | ICD-10-CM | POA: Diagnosis not present

## 2019-10-12 DIAGNOSIS — N186 End stage renal disease: Secondary | ICD-10-CM | POA: Diagnosis not present

## 2019-10-12 DIAGNOSIS — I132 Hypertensive heart and chronic kidney disease with heart failure and with stage 5 chronic kidney disease, or end stage renal disease: Secondary | ICD-10-CM | POA: Diagnosis not present

## 2019-10-13 DIAGNOSIS — Z992 Dependence on renal dialysis: Secondary | ICD-10-CM | POA: Diagnosis not present

## 2019-10-13 DIAGNOSIS — N186 End stage renal disease: Secondary | ICD-10-CM | POA: Diagnosis not present

## 2019-10-13 DIAGNOSIS — E8779 Other fluid overload: Secondary | ICD-10-CM | POA: Diagnosis not present

## 2019-10-14 DIAGNOSIS — Z992 Dependence on renal dialysis: Secondary | ICD-10-CM | POA: Diagnosis not present

## 2019-10-14 DIAGNOSIS — N186 End stage renal disease: Secondary | ICD-10-CM | POA: Diagnosis not present

## 2019-10-15 DIAGNOSIS — Z992 Dependence on renal dialysis: Secondary | ICD-10-CM | POA: Diagnosis not present

## 2019-10-15 DIAGNOSIS — D631 Anemia in chronic kidney disease: Secondary | ICD-10-CM | POA: Diagnosis not present

## 2019-10-15 DIAGNOSIS — E8779 Other fluid overload: Secondary | ICD-10-CM | POA: Diagnosis not present

## 2019-10-15 DIAGNOSIS — D509 Iron deficiency anemia, unspecified: Secondary | ICD-10-CM | POA: Diagnosis not present

## 2019-10-15 DIAGNOSIS — N186 End stage renal disease: Secondary | ICD-10-CM | POA: Diagnosis not present

## 2019-10-15 DIAGNOSIS — N2581 Secondary hyperparathyroidism of renal origin: Secondary | ICD-10-CM | POA: Diagnosis not present

## 2019-10-16 ENCOUNTER — Encounter (INDEPENDENT_AMBULATORY_CARE_PROVIDER_SITE_OTHER): Payer: Self-pay

## 2019-10-16 ENCOUNTER — Telehealth: Payer: Self-pay | Admitting: Pulmonary Disease

## 2019-10-16 ENCOUNTER — Ambulatory Visit (INDEPENDENT_AMBULATORY_CARE_PROVIDER_SITE_OTHER): Payer: Medicare Other

## 2019-10-16 ENCOUNTER — Other Ambulatory Visit: Payer: Self-pay

## 2019-10-16 ENCOUNTER — Telehealth: Payer: Self-pay

## 2019-10-16 ENCOUNTER — Ambulatory Visit (INDEPENDENT_AMBULATORY_CARE_PROVIDER_SITE_OTHER): Payer: Medicare Other | Admitting: Nurse Practitioner

## 2019-10-16 MED ORDER — TRELEGY ELLIPTA 100-62.5-25 MCG/INH IN AEPB: 1.0000 | INHALATION_SPRAY | Freq: Every day | RESPIRATORY_TRACT | 5 refills | Status: AC

## 2019-10-16 NOTE — Telephone Encounter (Signed)
Rx has been sent to preferred pharmacy. Called and LM to make pt aware. Nothing further needed.

## 2019-10-16 NOTE — Telephone Encounter (Addendum)
PA was done on 10/16/19 for Trelegy and denied. States that he needs to try four of the below covered drugs Anoro Bevespi Breo Serevent Diskus Stiolto Respimat Symbicort Please advise.

## 2019-10-16 NOTE — Telephone Encounter (Signed)
Created in error

## 2019-10-17 ENCOUNTER — Telehealth: Payer: Self-pay

## 2019-10-17 DIAGNOSIS — D509 Iron deficiency anemia, unspecified: Secondary | ICD-10-CM | POA: Diagnosis not present

## 2019-10-17 DIAGNOSIS — N186 End stage renal disease: Secondary | ICD-10-CM | POA: Diagnosis not present

## 2019-10-17 DIAGNOSIS — J449 Chronic obstructive pulmonary disease, unspecified: Secondary | ICD-10-CM

## 2019-10-17 DIAGNOSIS — D631 Anemia in chronic kidney disease: Secondary | ICD-10-CM | POA: Diagnosis not present

## 2019-10-17 DIAGNOSIS — E8779 Other fluid overload: Secondary | ICD-10-CM | POA: Diagnosis not present

## 2019-10-17 DIAGNOSIS — Z992 Dependence on renal dialysis: Secondary | ICD-10-CM | POA: Diagnosis not present

## 2019-10-17 DIAGNOSIS — N2581 Secondary hyperparathyroidism of renal origin: Secondary | ICD-10-CM | POA: Diagnosis not present

## 2019-10-17 MED ORDER — ANORO ELLIPTA 62.5-25 MCG/INH IN AEPB: 1.0000 | INHALATION_SPRAY | Freq: Every day | RESPIRATORY_TRACT | 3 refills | Status: DC

## 2019-10-17 MED ORDER — FLOVENT DISKUS 100 MCG/BLIST IN AEPB: 1.0000 | INHALATION_SPRAY | Freq: Two times a day (BID) | RESPIRATORY_TRACT | 3 refills | Status: AC

## 2019-10-17 MED ORDER — BUDESONIDE 180 MCG/ACT IN AEPB: 1.0000 | INHALATION_SPRAY | Freq: Two times a day (BID) | RESPIRATORY_TRACT | Status: DC

## 2019-10-17 NOTE — Telephone Encounter (Signed)
Pharmacy sent a drug change request for flovent. The preferred inhaler is pulmicort. Please advise.

## 2019-10-17 NOTE — Telephone Encounter (Addendum)
New RX sent to preferred pharmacy. Pt made aware of change.

## 2019-10-17 NOTE — Telephone Encounter (Signed)
Allrighty then, Pulmicort Flexhaler 180 mcg 1 puff BID

## 2019-10-17 NOTE — Telephone Encounter (Signed)
We could try Anoro Ellipta 1 inhalation daily AND Flovent Diskus 1 puff twice a day to give him the same effect.

## 2019-10-17 NOTE — Telephone Encounter (Signed)
Called and made pt aware of the changes to his inhalers. Sent RX into preferred pharmacy. Pt verbalized understanding. Nothing further needed.

## 2019-10-18 DIAGNOSIS — I132 Hypertensive heart and chronic kidney disease with heart failure and with stage 5 chronic kidney disease, or end stage renal disease: Secondary | ICD-10-CM | POA: Diagnosis not present

## 2019-10-18 DIAGNOSIS — D631 Anemia in chronic kidney disease: Secondary | ICD-10-CM | POA: Diagnosis not present

## 2019-10-18 DIAGNOSIS — J181 Lobar pneumonia, unspecified organism: Secondary | ICD-10-CM | POA: Diagnosis not present

## 2019-10-18 DIAGNOSIS — E1122 Type 2 diabetes mellitus with diabetic chronic kidney disease: Secondary | ICD-10-CM | POA: Diagnosis not present

## 2019-10-18 DIAGNOSIS — I5032 Chronic diastolic (congestive) heart failure: Secondary | ICD-10-CM | POA: Diagnosis not present

## 2019-10-18 DIAGNOSIS — N186 End stage renal disease: Secondary | ICD-10-CM | POA: Diagnosis not present

## 2019-10-19 DIAGNOSIS — D509 Iron deficiency anemia, unspecified: Secondary | ICD-10-CM | POA: Diagnosis not present

## 2019-10-19 DIAGNOSIS — N2581 Secondary hyperparathyroidism of renal origin: Secondary | ICD-10-CM | POA: Diagnosis not present

## 2019-10-19 DIAGNOSIS — D631 Anemia in chronic kidney disease: Secondary | ICD-10-CM | POA: Diagnosis not present

## 2019-10-19 DIAGNOSIS — Z992 Dependence on renal dialysis: Secondary | ICD-10-CM | POA: Diagnosis not present

## 2019-10-19 DIAGNOSIS — E8779 Other fluid overload: Secondary | ICD-10-CM | POA: Diagnosis not present

## 2019-10-19 DIAGNOSIS — N186 End stage renal disease: Secondary | ICD-10-CM | POA: Diagnosis not present

## 2019-10-22 DIAGNOSIS — N2581 Secondary hyperparathyroidism of renal origin: Secondary | ICD-10-CM | POA: Diagnosis not present

## 2019-10-22 DIAGNOSIS — Z992 Dependence on renal dialysis: Secondary | ICD-10-CM | POA: Diagnosis not present

## 2019-10-22 DIAGNOSIS — E8779 Other fluid overload: Secondary | ICD-10-CM | POA: Diagnosis not present

## 2019-10-22 DIAGNOSIS — N186 End stage renal disease: Secondary | ICD-10-CM | POA: Diagnosis not present

## 2019-10-22 DIAGNOSIS — D509 Iron deficiency anemia, unspecified: Secondary | ICD-10-CM | POA: Diagnosis not present

## 2019-10-22 DIAGNOSIS — D631 Anemia in chronic kidney disease: Secondary | ICD-10-CM | POA: Diagnosis not present

## 2019-10-23 DIAGNOSIS — J181 Lobar pneumonia, unspecified organism: Secondary | ICD-10-CM | POA: Diagnosis not present

## 2019-10-23 DIAGNOSIS — I132 Hypertensive heart and chronic kidney disease with heart failure and with stage 5 chronic kidney disease, or end stage renal disease: Secondary | ICD-10-CM | POA: Diagnosis not present

## 2019-10-23 DIAGNOSIS — N186 End stage renal disease: Secondary | ICD-10-CM | POA: Diagnosis not present

## 2019-10-23 DIAGNOSIS — I5032 Chronic diastolic (congestive) heart failure: Secondary | ICD-10-CM | POA: Diagnosis not present

## 2019-10-23 DIAGNOSIS — E1122 Type 2 diabetes mellitus with diabetic chronic kidney disease: Secondary | ICD-10-CM | POA: Diagnosis not present

## 2019-10-23 DIAGNOSIS — D631 Anemia in chronic kidney disease: Secondary | ICD-10-CM | POA: Diagnosis not present

## 2019-10-24 DIAGNOSIS — D509 Iron deficiency anemia, unspecified: Secondary | ICD-10-CM | POA: Diagnosis not present

## 2019-10-24 DIAGNOSIS — N2581 Secondary hyperparathyroidism of renal origin: Secondary | ICD-10-CM | POA: Diagnosis not present

## 2019-10-24 DIAGNOSIS — Z992 Dependence on renal dialysis: Secondary | ICD-10-CM | POA: Diagnosis not present

## 2019-10-24 DIAGNOSIS — E8779 Other fluid overload: Secondary | ICD-10-CM | POA: Diagnosis not present

## 2019-10-24 DIAGNOSIS — N186 End stage renal disease: Secondary | ICD-10-CM | POA: Diagnosis not present

## 2019-10-24 DIAGNOSIS — D631 Anemia in chronic kidney disease: Secondary | ICD-10-CM | POA: Diagnosis not present

## 2019-10-25 ENCOUNTER — Ambulatory Visit: Payer: Medicare Other | Admitting: Physician Assistant

## 2019-10-26 DIAGNOSIS — N186 End stage renal disease: Secondary | ICD-10-CM | POA: Diagnosis not present

## 2019-10-26 DIAGNOSIS — Z992 Dependence on renal dialysis: Secondary | ICD-10-CM | POA: Diagnosis not present

## 2019-10-26 DIAGNOSIS — E8779 Other fluid overload: Secondary | ICD-10-CM | POA: Diagnosis not present

## 2019-10-26 DIAGNOSIS — N2581 Secondary hyperparathyroidism of renal origin: Secondary | ICD-10-CM | POA: Diagnosis not present

## 2019-10-26 DIAGNOSIS — D509 Iron deficiency anemia, unspecified: Secondary | ICD-10-CM | POA: Diagnosis not present

## 2019-10-26 DIAGNOSIS — D631 Anemia in chronic kidney disease: Secondary | ICD-10-CM | POA: Diagnosis not present

## 2019-10-26 NOTE — Progress Notes (Deleted)
Office Visit    Patient Name: Daryl Weeks Date of Encounter: 10/26/2019  Primary Care Provider:  Olin Hauser, DO Primary Cardiologist:  Kathlyn Sacramento, MD  Chief Complaint    59 year old male with history of CAD s/p four-vessel bypass in 2015, newly diagnosed Afib with RVR on reduced dose OAC, end-stage renal disease 2/2 IgA nephropathy on dialysis 4 days a week (M, W, F, Sa), s/p stenting in the L subclavian with multiple failed accesses, hypertension, hyperlipidemia, HFpEF (EF 50 to 55% 05/2019), anemia of chronic disease, COPD, RA, anxiety, arthritis, sleep apnea, and ongoing tobacco and alcohol abuse, and who is being seen today for 2-week follow-up.  Past Medical History    Past Medical History:  Diagnosis Date  . (HFpEF) heart failure with preserved ejection fraction ([place])    a. 05/2019 Echo: EF 50-55%, diast dysfxn, RVSP 56.51mmHg, Sev dil LA. Mildly dil PA.  [name] Anemia   . Anxiety   . Arthritis   . Colon polyps   . COPD (chronic obstructive pulmonary disease) ([place])   . Coronary artery disease    a. 10/2013 NSTEMI/Cath: Severe 3VD-->CABG x 4 @ [facility] 01/2014 (LIMA->LAD, VG->OM1->OM2, VG->RCA); b. 11/2014 MV: No isch/infarct; c. 05/2019 MV: EF 40% (50-55 by echo), small,mild, fixed apical defect - ? atten vs infarct. No ischemia->low risk.  . ESRD (end stage renal disease) ([place])    a. MWFSat HD  . ETOH abuse    a. 2 beers/night.  [name] GERD (gastroesophageal reflux disease)   . History of pneumonia   . Hyperlipidemia   . Hypertension   . IgA nephropathy   . Lower GI bleed    a. Due to colon polyps. Status post resection of 14 polyps  . Non-ST elevation MI (NSTEMI) ([place])   . PAF (paroxysmal atrial fibrillation) ([place])    a. Dx 05/2019. CHA2DS2VASc = 3-->Eliquis/Amio.  [name] Rheumatoid arthritis ([place])   . Shortness of breath   . Sleep apnea   . Tobacco abuse    Past Surgical History:  Procedure Laterality Date  . A/V FISTULAGRAM Left 10/28/2016   Procedure: A/V  Fistulagram;  Surgeon: [name], MD;  Location: [place] [facility];  Service: Cardiovascular;  Laterality: Left;  . A/V FISTULAGRAM Left 12/07/2018   Procedure: A/V FISTULAGRAM;  Surgeon: [name], MD;  Location: [place] [facility];  Service: Cardiovascular;  Laterality: Left;  . A/V FISTULAGRAM N/A 06/04/2019   Procedure: A/V Fistulagram- LEFT;  Surgeon: [name], MD;  Location: [place] [facility];  Service: Cardiovascular;  Laterality: N/A;  . A/V SHUNT INTERVENTION N/A 10/28/2016   Procedure: A/V Shunt Intervention;  Surgeon: [name], MD;  Location: [place] [facility];  Service: Cardiovascular;  Laterality: N/A;  . A/V SHUNTOGRAM Left 02/01/2019   Procedure: A/V SHUNTOGRAM;  Surgeon: [name], MD;  Location: [place] [facility];  Service: Cardiovascular;  Laterality: Left;  . AV FISTULA PLACEMENT    . CARDIAC CATHETERIZATION     RCA 90% and calcified mid LAD 80% Stenosis  . CORONARY ARTERY BYPASS GRAFT N/A 11/29/2013   Procedure: CORONARY ARTERY BYPASS GRAFTING (CABG) x 4 using endoscopically harvested right saphenous vein and left internal mammary artery;  Surgeon: [name], MD;  Location: [place] [facility];  Service: Open Heart Surgery;  Laterality: N/A;  . dialysis catheterr  2/15  . DIALYSIS/PERMA CATHETER INSERTION N/A 07/16/2019   Procedure: DIALYSIS/PERMA CATHETER INSERTION WITH VAC CHANGE UNDER SEDATION;  Surgeon: Leotis Pain  S, MD;  Location: [place] [facility];  Service: Cardiovascular;  Laterality: N/A;  . DIALYSIS/PERMA CATHETER REMOVAL N/A 02/15/2019   Procedure: DIALYSIS/PERMA CATHETER REMOVAL;  Surgeon: [name], MD;  Location: [place] [facility];  Service: Cardiovascular;  Laterality: N/A;  . INSERTION OF DIALYSIS CATHETER N/A 12/14/2018   Procedure: INSERTION OF DIALYSIS CATHETER ( [place] );  Surgeon: [name], MD;  Location: [facility];  Service: Vascular;  Laterality: N/A;  . INTRAOPERATIVE TRANSESOPHAGEAL ECHOCARDIOGRAM N/A 11/29/2013   Procedure:  INTRAOPERATIVE TRANSESOPHAGEAL ECHOCARDIOGRAM;  Surgeon: [name], MD;  Location: [place] [facility];  Service: Open Heart Surgery;  Laterality: N/A;  . LIGATION OF ARTERIOVENOUS  FISTULA Left 07/13/2019   Procedure: LIGATION OF ARTERIOVENOUS  FISTULA;  Surgeon: [name], MD;  Location: [facility];  Service: Vascular;  Laterality: Left;  . PERIPHERAL VASCULAR CATHETERIZATION N/A 06/12/2015   Procedure: A/V Shuntogram/Fistulagram;  Surgeon: [name], MD;  Location: [place] [facility];  Service: Cardiovascular;  Laterality: N/A;  . PERIPHERAL VASCULAR CATHETERIZATION Left 06/12/2015   Procedure: A/V Shunt Intervention;  Surgeon: [name], MD;  Location: [place] [facility];  Service: Cardiovascular;  Laterality: Left;  . RENAL BIOPSY Left 14  . REVISON OF ARTERIOVENOUS FISTULA Left 12/14/2018   Procedure: REVISON OF ARTERIOVENOUS FISTULA;  Surgeon: [name], MD;  Location: [facility];  Service: Vascular;  Laterality: Left;  . UPPER EXTREMITY VENOGRAPHY Bilateral 09/18/2019   Procedure: UPPER EXTREMITY VENOGRAPHY;  Surgeon: Katha Cabal, MD;  Location: [place] [facility];  Service: Cardiovascular;  Laterality: Bilateral;    Allergies  Allergies  Allergen Reactions  . Lisinopril     History of Present Illness    59 year old male with PMH as above.  In 2015, he had a NSTEMI with catheterization revealing severe multivessel disease.  Echo showed normal LV function at that time.  He was referred to CT surgery and underwent CABG x4 01/2014.  His last ischemic evaluation was in 2016, as he was being evaluated at [facility] for renal transplant.  Stress testing was nonischemic.  He was last seen by our office 06/2017 and has been lost to follow-up since that time.  He reportedly lives locally and continues to smoke half pack a day and drink at least 2 beers every evening.  At his last hospital admission, he reported some degree of chronic DOE and intermittent chest discomfort.  He was admitted 05/2019  with 2 recent falls, unsteadiness, slurred speech, DOE, and anxiety which caused him to pace the floor all night. He reportedly had gone to dialysis and was sent to the ED as he was noted to be in Afib with RVR, hypertensive, hypoxic, and hyperkalemic.C hest x-ray was concerning for lower lobe infiltrates versus edema. MRI negative for any acute infarcts but showed chronic lacunar infarcts in the left frontal lobe white matter and left basal ganglia. He was transferred to the ICU for Afib with RVR and rates noted in the 130s to 150s.  He complained of chest discomfort but was otherwise asymptomatic and hemodynamically stable. He did note a h/o palpitations.  Stress test was performed and without significant ischemia. Echo showed nl EF.  Before discharge, he was transitioned to oral BB, amiodarone, and Eliquis 5mg  BID.    He has had a h/o multiple failed access attempts for dialysis. In 06/2019, he underwent ligation of the native portion of AV fistula with excision of the entire Artegraft and surrounding infected hematoma. In 07/2019,  he underwent ultrasound guidance for vascular access to R internal jugular vein with right jugular venogram / superior venacavogram. He had fluoroscopic guidance for placement of catheter and tunneled hemodialysis catheter via R IJV. Occluded R internal jugular at the level of the clavicle was noted. He was discharged on Eliquis 5mg  BID. When seen in the vascular surgery office on 12/21, however, his Eliquis had been reduced to 2.5mg  BID with patient report today that [facility] was reduced by nephrology between appointments. Given his RUE showed small veins not suitable for fistula creation, he then underwent 09/17/18 venograms/angiography to determine if new access could be created in the L arm. His L subclavian vein demonstrated greater than 70% stenosis within the L subclavian and at the confluence of the L innominate. Stent placement of the L subclavian vein was performed, as well as  introduction catheter into the left internal jugular vein. He was discharged on Eliquis 2.5mg  BID.   On 09/23/18, he presented to the [facility] ED with report of hemoptysis for the last 30-45 minutes. He was noted to again be hyperkalemic on admission. Critical care was consulted with suspicion of pna; however, discharge summary notes that this dx was r/o as afebrile and CT more consistent with atelectasis. It was thus thought his hemoptysis was 2/2 coughing with recommendation for prednisone for 10 days at discharge. He was also instructed to hold [facility] for an additional 2-3 days after discharge or longer if more hemoptysis.  He was seen in clinic 10/09/19 and noted he was in a poor state of health. He reported that he bruised easily but denied any further hemoptysis since his hospitalization. No s/sx of acute GIB; however, he did note BRBPR, which he attributed to his hemorrhoids. He had worsening fatigue. He was restless at night, which he attributed to anxiety. He had not been checking his weight or blood pressure. He continued to smoke with estimated 1/2 pk daily. He was drinking 2 beers per week. He reported compliance with HD on M, W, F, Sa. He stated that his BP sometimes dropped to SBP 70/50 with HD; however, he did not note any presyncope or syncope/LOC with this drop.  CBC was checked and stable with Eliquis 2.5mg  continued. LFTs were checked 2/2 jaundice and stable. Amiodarone was decreased to 200mg  daily. 2 week Zio was ordered to assess  Burden of Afib given his comorbid conditions.  Since then, he has had several telephone calls regarding possible kidney transplant.   Home Medications    Prior to Admission medications   Medication Sig Start Date End Date Taking? Authorizing Provider  albuterol (VENTOLIN HFA) 108 (90 Base) MCG/ACT inhaler Inhale 2 puffs into the lungs every 4 (four) hours as needed for wheezing or shortness of breath.    Yes [provider]  amiodarone (PACERONE) 200 MG  tablet Take 200 mg by mouth 2 (two) times daily.  09/02/19  Yes [provider]  amLODipine (NORVASC) 10 MG tablet take 1 tablet by mouth once daily Patient taking differently: Take 10 mg by mouth at bedtime.  06/29/16  Yes Wellington Hampshire, MD  apixaban (ELIQUIS) 2.5 MG TABS tablet Take 1 tablet (2.5 mg total) by mouth 2 (two) times daily. Patient taking differently: Take 2.5 mg by mouth daily.  09/27/19  Yes Max Sane, MD  aspirin 81 MG EC tablet Take 1 tablet (81 mg total) by mouth daily. 09/27/19  Yes Max Sane, MD  atorvastatin (LIPITOR) 20 MG tablet take 1 tablet by mouth at  bedtime Patient taking differently: Take 20 mg by mouth at bedtime.  04/25/14  Yes Gollan, Kathlene November, MD  AURYXIA 1 GM 210 MG(Fe) tablet Take 420 mg by mouth 3 (three) times daily with meals.    Yes [provider]  budesonide (PULMICORT FLEXHALER) 180 MCG/ACT inhaler Inhale 1 puff into the lungs 2 (two) times daily. 02/21/18  Yes Mikey College, NP  budesonide (PULMICORT) 0.5 MG/2ML nebulizer solution Take 2 mLs (0.5 mg total) by nebulization every 12 (twelve) hours. 06/03/17  Yes Laverle Hobby, MD  calcium carbonate (TUMS - DOSED IN MG ELEMENTAL CALCIUM) 500 MG chewable tablet Chew 1 tablet by mouth daily as needed for indigestion or heartburn.    Yes [provider]  carvedilol (COREG) 25 MG tablet Take 1 tablet (25 mg total) by mouth 2 (two) times daily with a meal. 06/05/19  Yes Gouru, Aruna, MD  cinacalcet (SENSIPAR) 30 MG tablet Take 30 mg by mouth daily with supper.    Yes [provider]  cloNIDine (CATAPRES) 0.2 MG tablet Take 1 tablet (0.2 mg total) by mouth 3 (three) times daily. 06/05/19  Yes Gouru, Illene Silver, MD  docusate sodium (COLACE) 100 MG capsule Take 1 capsule (100 mg total) by mouth 2 (two) times daily as needed for mild constipation. 06/05/19  Yes Gouru, Illene Silver, MD  furosemide (LASIX) 80 MG tablet Take 80 mg by mouth 2 (two) times daily.    Yes [provider]  gabapentin (NEURONTIN) 100 MG capsule Take 100 mg by mouth 3 (three) times daily.  01/14/18  Yes [provider]  hydrALAZINE (APRESOLINE) 50 MG tablet Take 75 mg by mouth 3 (three) times daily.    Yes [provider]  HYDROcodone-acetaminophen (NORCO/VICODIN) 5-325 MG tablet Take 1 tablet by mouth at bedtime.   Yes [provider]  loratadine (CLARITIN) 10 MG tablet Take 1 tablet (10 mg total) by mouth daily. 06/06/19  Yes Gouru, Illene Silver, MD  multivitamin (RENA-VIT) TABS tablet Take 1 tablet by mouth daily.  05/22/18  Yes [provider]  ondansetron (ZOFRAN-ODT) 4 MG disintegrating tablet Take 4 mg by mouth 2 (two) times daily as needed for nausea.   Yes [provider]  tiotropium (SPIRIVA) 18 MCG inhalation capsule Place 1 capsule (18 mcg total) into inhaler and inhale daily. Patient taking differently: Place 18 mcg into inhaler and inhale every evening.  05/24/17  Yes Laverle Hobby, MD  zolpidem (AMBIEN) 10 MG tablet Take 10 mg by mouth at bedtime. 07/20/19  Yes [provider]    Review of Systems    *** All other systems reviewed and are otherwise negative except as noted above.  Physical Exam    VS:  There were no vitals taken for this visit. , BMI There is no height or weight on file to calculate BMI. Vital Signs. There were no vitals taken for this visit. General: Well developed, well nourished, in no acute distress.*** Head: Normocephalic, atraumatic, sclera non-icteric, no xanthomas, nares are without discharge. Neck: Negative for carotid bruits. JVP not elevated. Lungs: Clear bilaterally to auscultation without wheezes, rales, or rhonchi. Breathing is unlabored. Heart: RRR S1 S2 without murmurs, rubs, or gallops.  Abdomen: Soft, non-tender, non-distended with normoactive bowel sounds. No rebound/guarding. Extremities: No clubbing or cyanosis. No edema. Distal pedal pulses are 2+ and equal bilaterally. Neuro:  Alert and oriented X 3. Moves all extremities spontaneously. Psych:  Responds to questions appropriately with a normal affect.  Accessory Clinical Findings  ECG personally reviewed by me today -  ***.  There were no vitals filed for this visit.Wt 77.5kg 170 lbs  Echo 05/29/2019 1. The left ventricle has low normal systolic function, with an ejection fraction of 50-55%. The cavity size was normal. There is moderately increased left ventricular wall thickness. Left ventricular diastolic Doppler parameters are consistent with  pseudonormalization. Elevated mean left atrial pressure No evidence of left ventricular regional wall motion abnormalities. 2. The right ventricle has moderately reduced systolic function. The cavity was mildly enlarged. There is no increase in right ventricular wall thickness. Right ventricular systolic pressure is moderately elevated with an estimated pressure of 56.7  mmHg. 3. Left atrial size was severely dilated. 4. The aortic valve is tricuspid. 5. The aorta is normal unless otherwise noted. 6. Mildly dilated pulmonary artery. 7. The inferior vena cava was dilated in size with <50% respiratory variability. 8. The interatrial septum was not well visualized.  NM Study 04/30/2017 Pharmacological myocardial perfusion imaging study with no significant ischemia Small mild fixed apical perfusion defect, unable to exclude attenuation artifact vs small prior MI. Normal wall motion, EF estimated at 40% (decreased EF secondary to GI uptake artifact) No EKG changes concerning for ischemia at peak stress or in recovery. Baseline EKG with ST abn in V5 and V6, more pronounced in V4 to V6 with lexiscan infusion Low risk scan  10/09/19:  Sodium 132, potassium 5.5  4.3, creatinine 8.53, BUN 40, AST 16, ALT 11, WBC 12.0, hemoglobin 12.0, hematocrit 35.3, plts 330, TSH 1.230   Assessment & Plan      Arvil Chaco, PA-C 10/26/2019, 2:30 PM

## 2019-10-27 DIAGNOSIS — I509 Heart failure, unspecified: Secondary | ICD-10-CM | POA: Diagnosis not present

## 2019-10-27 DIAGNOSIS — E8779 Other fluid overload: Secondary | ICD-10-CM | POA: Diagnosis not present

## 2019-10-27 DIAGNOSIS — Z992 Dependence on renal dialysis: Secondary | ICD-10-CM | POA: Diagnosis not present

## 2019-10-27 DIAGNOSIS — N186 End stage renal disease: Secondary | ICD-10-CM | POA: Diagnosis not present

## 2019-10-28 DIAGNOSIS — N186 End stage renal disease: Secondary | ICD-10-CM | POA: Diagnosis not present

## 2019-10-28 DIAGNOSIS — N2581 Secondary hyperparathyroidism of renal origin: Secondary | ICD-10-CM | POA: Diagnosis not present

## 2019-10-28 DIAGNOSIS — E8779 Other fluid overload: Secondary | ICD-10-CM | POA: Diagnosis not present

## 2019-10-28 DIAGNOSIS — D631 Anemia in chronic kidney disease: Secondary | ICD-10-CM | POA: Diagnosis not present

## 2019-10-28 DIAGNOSIS — Z992 Dependence on renal dialysis: Secondary | ICD-10-CM | POA: Diagnosis not present

## 2019-10-28 DIAGNOSIS — D509 Iron deficiency anemia, unspecified: Secondary | ICD-10-CM | POA: Diagnosis not present

## 2019-10-29 ENCOUNTER — Telehealth: Payer: Self-pay | Admitting: Physician Assistant

## 2019-10-29 ENCOUNTER — Telehealth (INDEPENDENT_AMBULATORY_CARE_PROVIDER_SITE_OTHER): Payer: Self-pay

## 2019-10-29 DIAGNOSIS — J181 Lobar pneumonia, unspecified organism: Secondary | ICD-10-CM | POA: Diagnosis not present

## 2019-10-29 DIAGNOSIS — J449 Chronic obstructive pulmonary disease, unspecified: Secondary | ICD-10-CM | POA: Diagnosis not present

## 2019-10-29 DIAGNOSIS — G4730 Sleep apnea, unspecified: Secondary | ICD-10-CM | POA: Diagnosis not present

## 2019-10-29 DIAGNOSIS — M069 Rheumatoid arthritis, unspecified: Secondary | ICD-10-CM | POA: Diagnosis not present

## 2019-10-29 DIAGNOSIS — Z9861 Coronary angioplasty status: Secondary | ICD-10-CM | POA: Diagnosis not present

## 2019-10-29 DIAGNOSIS — Z87891 Personal history of nicotine dependence: Secondary | ICD-10-CM | POA: Diagnosis not present

## 2019-10-29 DIAGNOSIS — D509 Iron deficiency anemia, unspecified: Secondary | ICD-10-CM | POA: Diagnosis not present

## 2019-10-29 DIAGNOSIS — Z951 Presence of aortocoronary bypass graft: Secondary | ICD-10-CM | POA: Diagnosis not present

## 2019-10-29 DIAGNOSIS — I480 Paroxysmal atrial fibrillation: Secondary | ICD-10-CM | POA: Diagnosis not present

## 2019-10-29 DIAGNOSIS — N2581 Secondary hyperparathyroidism of renal origin: Secondary | ICD-10-CM | POA: Diagnosis not present

## 2019-10-29 DIAGNOSIS — D631 Anemia in chronic kidney disease: Secondary | ICD-10-CM | POA: Diagnosis not present

## 2019-10-29 DIAGNOSIS — Z9181 History of falling: Secondary | ICD-10-CM | POA: Diagnosis not present

## 2019-10-29 DIAGNOSIS — F419 Anxiety disorder, unspecified: Secondary | ICD-10-CM | POA: Diagnosis not present

## 2019-10-29 DIAGNOSIS — N186 End stage renal disease: Secondary | ICD-10-CM | POA: Diagnosis not present

## 2019-10-29 DIAGNOSIS — Z992 Dependence on renal dialysis: Secondary | ICD-10-CM | POA: Diagnosis not present

## 2019-10-29 DIAGNOSIS — I5032 Chronic diastolic (congestive) heart failure: Secondary | ICD-10-CM | POA: Diagnosis not present

## 2019-10-29 DIAGNOSIS — I132 Hypertensive heart and chronic kidney disease with heart failure and with stage 5 chronic kidney disease, or end stage renal disease: Secondary | ICD-10-CM | POA: Diagnosis not present

## 2019-10-29 DIAGNOSIS — I252 Old myocardial infarction: Secondary | ICD-10-CM | POA: Diagnosis not present

## 2019-10-29 DIAGNOSIS — Z7901 Long term (current) use of anticoagulants: Secondary | ICD-10-CM | POA: Diagnosis not present

## 2019-10-29 DIAGNOSIS — K219 Gastro-esophageal reflux disease without esophagitis: Secondary | ICD-10-CM | POA: Diagnosis not present

## 2019-10-29 DIAGNOSIS — E8779 Other fluid overload: Secondary | ICD-10-CM | POA: Diagnosis not present

## 2019-10-29 DIAGNOSIS — I2510 Atherosclerotic heart disease of native coronary artery without angina pectoris: Secondary | ICD-10-CM | POA: Diagnosis not present

## 2019-10-29 DIAGNOSIS — M47814 Spondylosis without myelopathy or radiculopathy, thoracic region: Secondary | ICD-10-CM | POA: Diagnosis not present

## 2019-10-29 DIAGNOSIS — Z86010 Personal history of colonic polyps: Secondary | ICD-10-CM | POA: Diagnosis not present

## 2019-10-29 DIAGNOSIS — E1122 Type 2 diabetes mellitus with diabetic chronic kidney disease: Secondary | ICD-10-CM | POA: Diagnosis not present

## 2019-10-29 DIAGNOSIS — Z7982 Long term (current) use of aspirin: Secondary | ICD-10-CM | POA: Diagnosis not present

## 2019-10-29 DIAGNOSIS — E785 Hyperlipidemia, unspecified: Secondary | ICD-10-CM | POA: Diagnosis not present

## 2019-10-29 NOTE — Telephone Encounter (Signed)
I received a fax from [place] at [facility] for a permcath exchange for the patient. Patient is now scheduled with Dr. Lucky Cowboy for a permcath exchange on 11/01/19 with a 10:30 am arrival time to the MM. Pre-procedure instructions will be faxed back to [facility] at [facility].

## 2019-10-29 NOTE — Telephone Encounter (Signed)
-----   Message from Jeannette How sent at 10/29/2019  8:15 AM EST ----- Regarding: FW: EKG swap FYI.... can we please correct.  Thanks, ----- Message ----- From: Arvil Chaco, PA-C Sent: 10/26/2019   3:43 PM EST To: Jeannette How Subject: EKG swap                                       Hi Izora Gala!  I have another EKG I found in the wrong place and that needs fixed....  Mr. Rayner EKG from my 1/26 visit is actually scanned into his 1/26 Zio results (under CV studies) with nothing scanned in under the actual EKG from the visit. Let me know if that doesn't make sense.....   Are we able to fix this? His Zio results are not yet back / processed.   Thank you :) :) JV

## 2019-10-29 NOTE — Telephone Encounter (Signed)
Olin Hauser brought this to my attention Friday. The EKG has been correctly scanned in and IT has been made aware of the issue.

## 2019-10-29 NOTE — Progress Notes (Deleted)
Office Visit    Patient Name: Daryl Weeks Date of Encounter: 10/29/2019  Primary Care Provider:  Olin Hauser, DO Primary Cardiologist:  Kathlyn Sacramento, MD  Chief Complaint    59 year old male with history of CAD s/p four-vessel bypass in 2015, newly diagnosed Afib with RVR on reduced dose OAC, end-stage renal disease 2/2 IgA nephropathy on dialysis 4 days a week (M, W, F, Sa), s/p stenting in the L subclavian with multiple failed accesses, hypertension, hyperlipidemia, HFpEF (EF 50 to 55% 05/2019), anemia of chronic disease, COPD, RA, anxiety, arthritis, sleep apnea, and ongoing tobacco and alcohol abuse, and who is being seen today for 2-week follow-up.  Past Medical History    Past Medical History:  Diagnosis Date  . (HFpEF) heart failure with preserved ejection fraction ([place])    a. 05/2019 Echo: EF 50-55%, diast dysfxn, RVSP 56.8mmHg, Sev dil LA. Mildly dil PA.  [name] Anemia   . Anxiety   . Arthritis   . Colon polyps   . COPD (chronic obstructive pulmonary disease) ([place])   . Coronary artery disease    a. 10/2013 NSTEMI/Cath: Severe 3VD-->CABG x 4 @ [facility] 01/2014 (LIMA->LAD, VG->OM1->OM2, VG->RCA); b. 11/2014 MV: No isch/infarct; c. 05/2019 MV: EF 40% (50-55 by echo), small,mild, fixed apical defect - ? atten vs infarct. No ischemia->low risk.  . ESRD (end stage renal disease) ([place])    a. MWFSat HD  . ETOH abuse    a. 2 beers/night.  [name] GERD (gastroesophageal reflux disease)   . History of pneumonia   . Hyperlipidemia   . Hypertension   . IgA nephropathy   . Lower GI bleed    a. Due to colon polyps. Status post resection of 14 polyps  . Non-ST elevation MI (NSTEMI) ([place])   . PAF (paroxysmal atrial fibrillation) ([place])    a. Dx 05/2019. CHA2DS2VASc = 3-->Eliquis/Amio.  [name] Rheumatoid arthritis ([place])   . Shortness of breath   . Sleep apnea   . Tobacco abuse    Past Surgical History:  Procedure Laterality Date  . A/V FISTULAGRAM Left 10/28/2016   Procedure: A/V  Fistulagram;  Surgeon: [name], MD;  Location: [place] [facility];  Service: Cardiovascular;  Laterality: Left;  . A/V FISTULAGRAM Left 12/07/2018   Procedure: A/V FISTULAGRAM;  Surgeon: [name], MD;  Location: [place] [facility];  Service: Cardiovascular;  Laterality: Left;  . A/V FISTULAGRAM N/A 06/04/2019   Procedure: A/V Fistulagram- LEFT;  Surgeon: [name], MD;  Location: [place] [facility];  Service: Cardiovascular;  Laterality: N/A;  . A/V SHUNT INTERVENTION N/A 10/28/2016   Procedure: A/V Shunt Intervention;  Surgeon: [name], MD;  Location: [place] [facility];  Service: Cardiovascular;  Laterality: N/A;  . A/V SHUNTOGRAM Left 02/01/2019   Procedure: A/V SHUNTOGRAM;  Surgeon: [name], MD;  Location: [place] [facility];  Service: Cardiovascular;  Laterality: Left;  . AV FISTULA PLACEMENT    . CARDIAC CATHETERIZATION     RCA 90% and calcified mid LAD 80% Stenosis  . CORONARY ARTERY BYPASS GRAFT N/A 11/29/2013   Procedure: CORONARY ARTERY BYPASS GRAFTING (CABG) x 4 using endoscopically harvested right saphenous vein and left internal mammary artery;  Surgeon: [name], MD;  Location: [place] [facility];  Service: Open Heart Surgery;  Laterality: N/A;  . dialysis catheterr  2/15  . DIALYSIS/PERMA CATHETER INSERTION N/A 07/16/2019   Procedure: DIALYSIS/PERMA CATHETER INSERTION WITH VAC CHANGE UNDER SEDATION;  Surgeon: Leotis Pain  S, MD;  Location: [place] [facility];  Service: Cardiovascular;  Laterality: N/A;  . DIALYSIS/PERMA CATHETER REMOVAL N/A 02/15/2019   Procedure: DIALYSIS/PERMA CATHETER REMOVAL;  Surgeon: [name], MD;  Location: [place] [facility];  Service: Cardiovascular;  Laterality: N/A;  . INSERTION OF DIALYSIS CATHETER N/A 12/14/2018   Procedure: INSERTION OF DIALYSIS CATHETER ( [place] );  Surgeon: [name], MD;  Location: [facility];  Service: Vascular;  Laterality: N/A;  . INTRAOPERATIVE TRANSESOPHAGEAL ECHOCARDIOGRAM N/A 11/29/2013   Procedure:  INTRAOPERATIVE TRANSESOPHAGEAL ECHOCARDIOGRAM;  Surgeon: [name], MD;  Location: [place] [facility];  Service: Open Heart Surgery;  Laterality: N/A;  . LIGATION OF ARTERIOVENOUS  FISTULA Left 07/13/2019   Procedure: LIGATION OF ARTERIOVENOUS  FISTULA;  Surgeon: [name], MD;  Location: [facility];  Service: Vascular;  Laterality: Left;  . PERIPHERAL VASCULAR CATHETERIZATION N/A 06/12/2015   Procedure: A/V Shuntogram/Fistulagram;  Surgeon: [name], MD;  Location: [place] [facility];  Service: Cardiovascular;  Laterality: N/A;  . PERIPHERAL VASCULAR CATHETERIZATION Left 06/12/2015   Procedure: A/V Shunt Intervention;  Surgeon: [name], MD;  Location: [place] [facility];  Service: Cardiovascular;  Laterality: Left;  . RENAL BIOPSY Left 14  . REVISON OF ARTERIOVENOUS FISTULA Left 12/14/2018   Procedure: REVISON OF ARTERIOVENOUS FISTULA;  Surgeon: [name], MD;  Location: [facility];  Service: Vascular;  Laterality: Left;  . UPPER EXTREMITY VENOGRAPHY Bilateral 09/18/2019   Procedure: UPPER EXTREMITY VENOGRAPHY;  Surgeon: Katha Cabal, MD;  Location: [place] [facility];  Service: Cardiovascular;  Laterality: Bilateral;    Allergies  Allergies  Allergen Reactions  . Lisinopril     History of Present Illness    59 year old male with PMH as above. He has been evaluated at [facility] for renal transplant. He lives locally and continues to smoke half pack a day and drink at least 2 beers every evening.     In 2015, he had a NSTEMI with catheterization revealing severe multivessel disease.  Echo showed normal LV function at that time.  He was referred to CT surgery and underwent CABG x4 01/2014.  2016 stress testing was nonischemic. He was admitted 05/2019 with 2 recent falls, unsteadiness, slurred speech, DOE, and anxiety which caused him to pace the floor all night. He reportedly had gone to dialysis and was sent to the ED as he was noted to be in Afib with RVR, hypertensive, hypoxic, and  hyperkalemic. MRI showed chronic lacunar infarcts in the left frontal lobe white matter and left basal ganglia. He was transferred to the ICU for Afib with RVR and rates noted in the 130s to 150s.  He complained of chest discomfort but was otherwise asymptomatic and hemodynamically stable. He did note a h/o palpitations. Stress test was performed and without significant ischemia. Echo showed nl EF.  Before discharge, he was transitioned to oral BB, amiodarone, and Eliquis 5mg  BID.    He has had a h/o multiple failed access attempts for dialysis. In 06/2019, he underwent ligation of the native portion of AV fistula with excision of the entire Artegraft and surrounding infected hematoma. In 07/2019, he underwent ultrasound guidance for vascular access to R internal jugular vein with right jugular venogram / superior venacavogram. He had fluoroscopic guidance for placement of catheter and tunneled hemodialysis catheter via R IJV. Occluded R internal jugular at the level of the clavicle was noted. He was discharged on Eliquis 5mg  BID. When seen in the vascular surgery office  on 12/21, however, his Eliquis had been reduced to 2.5mg  BID with patient report today that [facility] was reduced by nephrology between appointments. Given his RUE showed small veins not suitable for fistula creation, he then underwent 09/17/18 venograms/angiography to determine if new access could be created in the L arm. His L subclavian vein demonstrated greater than 70% stenosis within the L subclavian and at the confluence of the L innominate. Stent placement of the L subclavian vein was performed, as well as introduction catheter into the left internal jugular vein. He was discharged on Eliquis 2.5mg  BID.   On 09/23/18, he presented to the [facility] ED with report of hemoptysis for the last 30-45 minutes. He was noted to again be hyperkalemic on admission. Critical care was consulted with suspicion of pna; however, discharge summary notes that this dx  was r/o as afebrile and CT more consistent with atelectasis. It was thus thought his hemoptysis was 2/2 coughing with recommendation for prednisone for 10 days at discharge. He was also instructed to hold [facility] for an additional 2-3 days after discharge or longer if more hemoptysis.  He was seen in clinic 10/09/19 and noted he was in a poor state of health. No s/sx of acute GIB; however, he did note BRBPR, which he attributed to his hemorrhoids. He had worsening fatigue. He was restless at night, which he attributed to anxiety. He had not been checking his weight or blood pressure. He continued to smoke with estimated 1/2 pk daily. He was drinking 2 beers per week. He reported compliance with HD on M, W, F, Sa. He stated that his BP sometimes dropped to SBP 70/50 with HD; however, he did not note any presyncope or syncope/LOC with this drop.  CBC was checked and stable with Eliquis 2.5mg  continued. LFTs were checked 2/2 jaundice and stable. Amiodarone was decreased to 200mg  daily. 2 week Zio was ordered to assess  Burden of Afib given his comorbid conditions.  Since then, he has had several telephone calls regarding possible kidney transplant.   Home Medications    Prior to Admission medications   Medication Sig Start Date End Date Taking? Authorizing Provider  albuterol (VENTOLIN HFA) 108 (90 Base) MCG/ACT inhaler Inhale 2 puffs into the lungs every 4 (four) hours as needed for wheezing or shortness of breath.    Yes [provider]  amiodarone (PACERONE) 200 MG tablet Take 200 mg by mouth 2 (two) times daily.  09/02/19  Yes [provider]  amLODipine (NORVASC) 10 MG tablet take 1 tablet by mouth once daily Patient taking differently: Take 10 mg by mouth at bedtime.  06/29/16  Yes Wellington Hampshire, MD  apixaban (ELIQUIS) 2.5 MG TABS tablet Take 1 tablet (2.5 mg total) by mouth 2 (two) times daily. Patient taking differently: Take 2.5 mg by mouth daily.  09/27/19  Yes Max Sane, MD    aspirin 81 MG EC tablet Take 1 tablet (81 mg total) by mouth daily. 09/27/19  Yes Max Sane, MD  atorvastatin (LIPITOR) 20 MG tablet take 1 tablet by mouth at bedtime Patient taking differently: Take 20 mg by mouth at bedtime.  04/25/14  Yes Gollan, Kathlene November, MD  AURYXIA 1 GM 210 MG(Fe) tablet Take 420 mg by mouth 3 (three) times daily with meals.    Yes [provider]  budesonide (PULMICORT FLEXHALER) 180 MCG/ACT inhaler Inhale 1 puff into the lungs 2 (two) times daily. 02/21/18  Yes Mikey College, NP  budesonide (PULMICORT) 0.5 MG/2ML  nebulizer solution Take 2 mLs (0.5 mg total) by nebulization every 12 (twelve) hours. 06/03/17  Yes Laverle Hobby, MD  calcium carbonate (TUMS - DOSED IN MG ELEMENTAL CALCIUM) 500 MG chewable tablet Chew 1 tablet by mouth daily as needed for indigestion or heartburn.    Yes [provider]  carvedilol (COREG) 25 MG tablet Take 1 tablet (25 mg total) by mouth 2 (two) times daily with a meal. 06/05/19  Yes Gouru, Aruna, MD  cinacalcet (SENSIPAR) 30 MG tablet Take 30 mg by mouth daily with supper.    Yes [provider]  cloNIDine (CATAPRES) 0.2 MG tablet Take 1 tablet (0.2 mg total) by mouth 3 (three) times daily. 06/05/19  Yes Gouru, Illene Silver, MD  docusate sodium (COLACE) 100 MG capsule Take 1 capsule (100 mg total) by mouth 2 (two) times daily as needed for mild constipation. 06/05/19  Yes Gouru, Illene Silver, MD  furosemide (LASIX) 80 MG tablet Take 80 mg by mouth 2 (two) times daily.    Yes [provider]  gabapentin (NEURONTIN) 100 MG capsule Take 100 mg by mouth 3 (three) times daily.  01/14/18  Yes [provider]  hydrALAZINE (APRESOLINE) 50 MG tablet Take 75 mg by mouth 3 (three) times daily.    Yes [provider]  HYDROcodone-acetaminophen (NORCO/VICODIN) 5-325 MG tablet Take 1 tablet by mouth at bedtime.   Yes [provider]  loratadine (CLARITIN) 10 MG tablet Take 1 tablet (10 mg total) by  mouth daily. 06/06/19  Yes Gouru, Illene Silver, MD  multivitamin (RENA-VIT) TABS tablet Take 1 tablet by mouth daily.  05/22/18  Yes [provider]  ondansetron (ZOFRAN-ODT) 4 MG disintegrating tablet Take 4 mg by mouth 2 (two) times daily as needed for nausea.   Yes [provider]  tiotropium (SPIRIVA) 18 MCG inhalation capsule Place 1 capsule (18 mcg total) into inhaler and inhale daily. Patient taking differently: Place 18 mcg into inhaler and inhale every evening.  05/24/17  Yes Laverle Hobby, MD  zolpidem (AMBIEN) 10 MG tablet Take 10 mg by mouth at bedtime. 07/20/19  Yes [provider]    Review of Systems    *** All other systems reviewed and are otherwise negative except as noted above.  Physical Exam    VS:  There were no vitals taken for this visit. , BMI There is no height or weight on file to calculate BMI. Vital Signs. There were no vitals taken for this visit. General: Well developed, well nourished, in no acute distress.*** Head: Normocephalic, atraumatic, sclera non-icteric, no xanthomas, nares are without discharge. Neck: Negative for carotid bruits. JVP not elevated. Lungs: Clear bilaterally to auscultation without wheezes, rales, or rhonchi. Breathing is unlabored. Heart: RRR S1 S2 without murmurs, rubs, or gallops.  Abdomen: Soft, non-tender, non-distended with normoactive bowel sounds. No rebound/guarding. Extremities: No clubbing or cyanosis. No edema. Distal pedal pulses are 2+ and equal bilaterally. Neuro: Alert and oriented X 3. Moves all extremities spontaneously. Psych:  Responds to questions appropriately with a normal affect.  Accessory Clinical Findings    ECG personally reviewed by me today -  ***.  There were no vitals filed for this visit.Wt 77.5kg 170 lbs  Echo 05/29/2019 1. The left ventricle has low normal systolic function, with an ejection fraction of 50-55%. The cavity size was normal. There is moderately increased left  ventricular wall thickness. Left ventricular diastolic Doppler parameters are consistent with  pseudonormalization. Elevated mean left atrial pressure No evidence of left ventricular regional  wall motion abnormalities. 2. The right ventricle has moderately reduced systolic function. The cavity was mildly enlarged. There is no increase in right ventricular wall thickness. Right ventricular systolic pressure is moderately elevated with an estimated pressure of 56.7  mmHg. 3. Left atrial size was severely dilated. 4. The aortic valve is tricuspid. 5. The aorta is normal unless otherwise noted. 6. Mildly dilated pulmonary artery. 7. The inferior vena cava was dilated in size with <50% respiratory variability. 8. The interatrial septum was not well visualized.  NM Study 04/30/2017 Pharmacological myocardial perfusion imaging study with no significant ischemia Small mild fixed apical perfusion defect, unable to exclude attenuation artifact vs small prior MI. Normal wall motion, EF estimated at 40% (decreased EF secondary to GI uptake artifact) No EKG changes concerning for ischemia at peak stress or in recovery. Baseline EKG with ST abn in V5 and V6, more pronounced in V4 to V6 with lexiscan infusion Low risk scan  10/09/19:  Sodium 132, potassium 5.5  4.3, creatinine 8.53, BUN 40, AST 16, ALT 11, WBC 12.0, hemoglobin 12.0, hematocrit 35.3, plts 330, TSH 1.230   Assessment & Plan      Arvil Chaco, PA-C 10/29/2019, 1:23 PM

## 2019-10-30 ENCOUNTER — Ambulatory Visit: Payer: Medicare Other | Admitting: Physician Assistant

## 2019-10-30 ENCOUNTER — Other Ambulatory Visit
Admission: RE | Admit: 2019-10-30 | Discharge: 2019-10-30 | Disposition: A | Discharge status: Home / Self Care | Payer: Medicare Other | Source: Ambulatory Visit | Attending: Vascular Surgery | Admitting: Vascular Surgery

## 2019-10-30 ENCOUNTER — Other Ambulatory Visit: Payer: Self-pay

## 2019-10-30 DIAGNOSIS — Z20822 Contact with and (suspected) exposure to covid-19: Secondary | ICD-10-CM | POA: Insufficient documentation

## 2019-10-30 DIAGNOSIS — Z01812 Encounter for preprocedural laboratory examination: Secondary | ICD-10-CM | POA: Insufficient documentation

## 2019-10-31 ENCOUNTER — Other Ambulatory Visit (INDEPENDENT_AMBULATORY_CARE_PROVIDER_SITE_OTHER): Payer: Self-pay | Admitting: Nurse Practitioner

## 2019-10-31 ENCOUNTER — Telehealth: Payer: Self-pay

## 2019-10-31 DIAGNOSIS — E8779 Other fluid overload: Secondary | ICD-10-CM | POA: Diagnosis not present

## 2019-10-31 DIAGNOSIS — Z992 Dependence on renal dialysis: Secondary | ICD-10-CM | POA: Diagnosis not present

## 2019-10-31 DIAGNOSIS — N186 End stage renal disease: Secondary | ICD-10-CM | POA: Diagnosis not present

## 2019-10-31 DIAGNOSIS — D509 Iron deficiency anemia, unspecified: Secondary | ICD-10-CM | POA: Diagnosis not present

## 2019-10-31 DIAGNOSIS — D631 Anemia in chronic kidney disease: Secondary | ICD-10-CM | POA: Diagnosis not present

## 2019-10-31 DIAGNOSIS — N2581 Secondary hyperparathyroidism of renal origin: Secondary | ICD-10-CM | POA: Diagnosis not present

## 2019-10-31 LAB — SARS CORONAVIRUS 2 (TAT 6-24 HRS): SARS Coronavirus 2: NEGATIVE

## 2019-10-31 NOTE — Telephone Encounter (Signed)
Attempted to change to a virtual visit due to weather.  Patient phone not working and lm to call office.

## 2019-11-01 ENCOUNTER — Ambulatory Visit: Admission: RE | Admit: 2019-11-01 | Payer: Medicare Other | Source: Home / Self Care | Admitting: Vascular Surgery

## 2019-11-01 ENCOUNTER — Encounter: Admission: RE | Payer: Self-pay | Source: Home / Self Care

## 2019-11-01 ENCOUNTER — Ambulatory Visit: Payer: Medicare Other | Admitting: Physician Assistant

## 2019-11-01 SURGERY — DIALYSIS/PERMA CATHETER INSERTION
Anesthesia: Moderate Sedation

## 2019-11-02 ENCOUNTER — Telehealth (INDEPENDENT_AMBULATORY_CARE_PROVIDER_SITE_OTHER): Payer: Self-pay

## 2019-11-02 DIAGNOSIS — N186 End stage renal disease: Secondary | ICD-10-CM | POA: Diagnosis not present

## 2019-11-02 DIAGNOSIS — D509 Iron deficiency anemia, unspecified: Secondary | ICD-10-CM | POA: Diagnosis not present

## 2019-11-02 DIAGNOSIS — Z992 Dependence on renal dialysis: Secondary | ICD-10-CM | POA: Diagnosis not present

## 2019-11-02 DIAGNOSIS — D631 Anemia in chronic kidney disease: Secondary | ICD-10-CM | POA: Diagnosis not present

## 2019-11-02 DIAGNOSIS — E8779 Other fluid overload: Secondary | ICD-10-CM | POA: Diagnosis not present

## 2019-11-02 DIAGNOSIS — N2581 Secondary hyperparathyroidism of renal origin: Secondary | ICD-10-CM | POA: Diagnosis not present

## 2019-11-02 NOTE — Telephone Encounter (Signed)
Spoke with Celeste at [facility] in [place] and the patient is now rescheduled with Dr. Lucky Cowboy for a permcath exchange on 11/08/19 with a 10:30 am arrival time to the [facility]. Patient will do covid testing on 11/06/19 between 12:-30-2:30 pm at the [place]. Pre-procedure instructions will be faxed to [facility] at [facility]

## 2019-11-03 DIAGNOSIS — I509 Heart failure, unspecified: Secondary | ICD-10-CM | POA: Diagnosis not present

## 2019-11-03 DIAGNOSIS — Z992 Dependence on renal dialysis: Secondary | ICD-10-CM | POA: Diagnosis not present

## 2019-11-03 DIAGNOSIS — E8779 Other fluid overload: Secondary | ICD-10-CM | POA: Diagnosis not present

## 2019-11-03 DIAGNOSIS — N186 End stage renal disease: Secondary | ICD-10-CM | POA: Diagnosis not present

## 2019-11-04 NOTE — Progress Notes (Signed)
Cardiology Office Note    Date:  11/06/2019   ID:  Daryl Weeks, DOB 03/31/1961, MRN [id number]  PCP:  Olin Hauser, DO  Cardiologist:  Kathlyn Sacramento, MD  Electrophysiologist:  None   Chief Complaint: Follow-up  History of Present Illness:   Daryl Weeks is a 59 y.o. male with history of CAD s/p 4-vessel CABG in 2015 after presenting with a NSTEMI in the setting of severe anemia with GI bleed, lone A. fib diagnosed in 05/2019, ESRD on HD 4 days/week (MWFS), HFpEF, pulmonary hypertension, prior CVAs, HTN, HLD, hemoptysis, hematochezia, anemia, COPD with prior tobacco use, alcohol use, sleep apnea, anxiety, arthritis, and GERD who presents for follow-up of the above.  He suffered a NSTEMI in 2015, with LHC revealing severe multivessel CAD. Echo at that time showed normal LVSF. He subsequently underwent 4-vessel CABG in 01/2014. Repeat ischemic evaluation in 2016, as part of his kidney transplant evaluation (no longer on the list secondary to tobacco and alcohol use), was nonischemic. He was lost to follow up from 06/2017 until he was admitted to the hospital in 05/2019 with noted unsteadiness leading to two falls, dyspnea, chest discomfort, and possible slurred speech. He was treated for PNA. CT head was not acute. MRI brain showed no acute infarct with chronic lacunar infarcts within the left frontal lobe white matter and left basal ganglia. CXR concerning for PNA vs edema. While undergoing HD, he developed new onset Afib with RVR. HS-Tn 29-->33. Echo showed an EF of 50-55%, DD, no RWMA, moderately reduced RVSF with mildly enlarged RV cavity size, PASP 56.7 mmHg, severely dilated left atrium, mild MR. Lexiscan Myoview showed no significant ischemia with small mild fixed apical perfusion defect, unable to exclude attenuation artifact vs small prior MI, and was overall a low risk scan. Carotid artery ultrasound showed no hemodynamically significant stenosis bilaterally with an incidentally  noted nonocclusive thrombus within the right IJ vein (recent HD catheter within the right IJ vein) and antegrade flow of the bilateral vertebral arteries. He was discharged on Coreg, amiodarone, and Eliquis. He has subsequently undergone ligation of his dialysis site in 06/2019 secondary to bleeding issues. EKG showed sinus bradycardia with 1st degree AV block. He was readmitted in 09/2019 with hemoptysis and hyperkalemia. CTA chest showed no evidence of PE with atelectasis vs PNA with hemoptysis felt to be secondary from coughing. HS-TN 42-->37. EKG showed sinus rhythm. His Eliquis was briefly held.   He was seen by Marrianne Mood, PA-C in follow up on 10/09/2019. At that time, there was noted confusion regarding the patient's Eliquis dosing. Note indicates his Eliquis had been reduced to 2.5 mg bid by nephrology and had been maintained on that dose. It appeared he was taking Eliquis 2.5 once daily at the time of his visit in the setting of hematochezia, which he attributed to hemorrhoids, as well as his history of hemoptysis. He was continued on this dose and frequency at that visit. ASA was stopped. There was concern for the patient being jaundiced. He was noted to be in sinus rhythm, though did continue to note palpitations. In this setting, he underwent Zio patch which preliminary report showed the predominant rhythm of sinus with an average heart rate of 56 bpm with a range of 50 to 97 bpm.  First-degree AV block was noted.  2 atrial runs occurred with the fastest being 4 beats with a maximum rate of 97 bpm and the longest lasting 5 beats with an average rate of  92 bpm.  Isolated PACs, atrial couplets, PVCs, and ventricular couplets were rare.  Patient triggered events corresponded to sinus rhythm along with short atrial runs. Amiodarone was decreased to 200 mg daily. Labs checked at that time were as outlined below. He was continued on once daily, reduced dose Eliquis.   He comes in today indicating he is  doing reasonably well.  He denies any chest pain, dyspnea, palpitations, dizziness, presyncope, syncope, or falls.  He continues to undergo dialysis 4 days/week without adverse event.  He is scheduled for dialysis permacath insertion later this week.  He denies any further hemoptysis or hematochezia.  No melena.  He remains on Eliquis 2.5 mg once daily dosing.  He indicates he primarily does his medications himself though is not certain what he is taking.  He currently does not have a home health aide coming out.  No lower extremity swelling, abdominal distention, orthopnea, PND, early satiety.   Labs independently reviewed: 09/2019 - potassium 5.5-->4.3, albumin 3.7, AST/ALT normal, T bili 0.2, WBC 12.0, HGB 12.0, PLT 330, TSH normal 07/2019 - magnesium 2.5 05/2019 - TC 137, TG 55, HDL 72, LDL 54  Past Medical History:  Diagnosis Date  . (HFpEF) heart failure with preserved ejection fraction ([place])    a. 05/2019 Echo: EF 50-55%, diast dysfxn, RVSP 56.71mmHg, Sev dil LA. Mildly dil PA.  [name] Anemia   . Anxiety   . Arthritis   . Colon polyps   . COPD (chronic obstructive pulmonary disease) ([place])   . Coronary artery disease    a. 10/2013 NSTEMI/Cath: Severe 3VD-->CABG x 4 @ [facility] 01/2014 (LIMA->LAD, VG->OM1->OM2, VG->RCA); b. 11/2014 MV: No isch/infarct; c. 05/2019 MV: EF 40% (50-55 by echo), small,mild, fixed apical defect - ? atten vs infarct. No ischemia->low risk.  . ESRD (end stage renal disease) ([place])    a. MWFSat HD  . ETOH abuse    a. 2 beers/night.  [name] GERD (gastroesophageal reflux disease)   . History of pneumonia   . Hyperlipidemia   . Hypertension   . IgA nephropathy   . Lower GI bleed    a. Due to colon polyps. Status post resection of 14 polyps  . Non-ST elevation MI (NSTEMI) ([place])   . PAF (paroxysmal atrial fibrillation) ([place])    a. Dx 05/2019. CHA2DS2VASc = 3-->Eliquis/Amio.  [name] Rheumatoid arthritis ([place])   . Shortness of breath   . Sleep apnea   . Tobacco abuse     Past Surgical  History:  Procedure Laterality Date  . A/V FISTULAGRAM Left 10/28/2016   Procedure: A/V Fistulagram;  Surgeon: [name], MD;  Location: [facility];  Service: Cardiovascular;  Laterality: Left;  . A/V FISTULAGRAM Left 12/07/2018   Procedure: A/V FISTULAGRAM;  Surgeon: [name], MD;  Location: [place] [facility];  Service: Cardiovascular;  Laterality: Left;  . A/V FISTULAGRAM N/A 06/04/2019   Procedure: A/V Fistulagram- LEFT;  Surgeon: [name], MD;  Location: [facility];  Service: Cardiovascular;  Laterality: N/A;  . A/V SHUNT INTERVENTION N/A 10/28/2016   Procedure: A/V Shunt Intervention;  Surgeon: [name], MD;  Location: [facility];  Service: Cardiovascular;  Laterality: N/A;  . A/V SHUNTOGRAM Left 02/01/2019   Procedure: A/V SHUNTOGRAM;  Surgeon: [name], MD;  Location: [facility];  Service: Cardiovascular;  Laterality: Left;  . AV FISTULA PLACEMENT    . CARDIAC CATHETERIZATION     RCA 90% and calcified mid LAD  80% Stenosis  . CORONARY ARTERY BYPASS GRAFT N/A 11/29/2013   Procedure: CORONARY ARTERY BYPASS GRAFTING (CABG) x 4 using endoscopically harvested right saphenous vein and left internal mammary artery;  Surgeon: [name], MD;  Location: [facility];  Service: Open Heart Surgery;  Laterality: N/A;  . dialysis catheterr  2/15  . DIALYSIS/PERMA CATHETER INSERTION N/A 07/16/2019   Procedure: DIALYSIS/PERMA CATHETER INSERTION WITH VAC CHANGE UNDER SEDATION;  Surgeon: [name], MD;  Location: [place];  Service: Cardiovascular;  Laterality: N/A;  . DIALYSIS/PERMA CATHETER REMOVAL N/A 02/15/2019   Procedure: DIALYSIS/PERMA CATHETER REMOVAL;  Surgeon: [name], MD;  Location: [facility];  Service: Cardiovascular;  Laterality: N/A;  . INSERTION OF DIALYSIS CATHETER N/A 12/14/2018   Procedure: INSERTION OF DIALYSIS CATHETER ( [place] [facility] );  Surgeon: [name], MD;  Location: [facility];  Service: Vascular;   Laterality: N/A;  . INTRAOPERATIVE TRANSESOPHAGEAL ECHOCARDIOGRAM N/A 11/29/2013   Procedure: INTRAOPERATIVE TRANSESOPHAGEAL ECHOCARDIOGRAM;  Surgeon: [name], MD;  Location: [facility];  Service: Open Heart Surgery;  Laterality: N/A;  . LIGATION OF ARTERIOVENOUS  FISTULA Left 07/13/2019   Procedure: LIGATION OF ARTERIOVENOUS  FISTULA;  Surgeon: [name], MD;  Location: [facility];  Service: Vascular;  Laterality: Left;  . PERIPHERAL VASCULAR CATHETERIZATION N/A 06/12/2015   Procedure: A/V Shuntogram/Fistulagram;  Surgeon: [name], MD;  Location: [facility];  Service: Cardiovascular;  Laterality: N/A;  . PERIPHERAL VASCULAR CATHETERIZATION Left 06/12/2015   Procedure: A/V Shunt Intervention;  Surgeon: [name], MD;  Location: [facility];  Service: Cardiovascular;  Laterality: Left;  . RENAL BIOPSY Left 14  . REVISON OF ARTERIOVENOUS FISTULA Left 12/14/2018   Procedure: REVISON OF ARTERIOVENOUS FISTULA;  Surgeon: [name], MD;  Location: [facility];  Service: Vascular;  Laterality: Left;  . UPPER EXTREMITY VENOGRAPHY Bilateral 09/18/2019   Procedure: UPPER EXTREMITY VENOGRAPHY;  Surgeon: Katha Cabal, MD;  Location: [facility];  Service: Cardiovascular;  Laterality: Bilateral;    Current Medications: No outpatient medications have been marked as taking for the 11/06/19 encounter (Office Visit) with Rise Mu, PA-C.   Current Facility-Administered Medications for the 11/06/19 encounter (Office Visit) with Rise Mu, PA-C  Medication  . budesonide (PULMICORT) 180 MCG/ACT inhaler 1 puff    Allergies:   Lisinopril   Social History   Socioeconomic History  . Marital status: Single    Spouse name: Not on file  . Number of children: Not on file  . Years of education: Not on file  . Highest education level: 8th grade  Occupational History  . Not on file  Tobacco Use  . Smoking status: Current Every Day Smoker    Packs/day: 0.50    Years:  32.00    Pack years: 16.00    Types: Cigarettes  . Smokeless tobacco: Never Used  . Tobacco comment: daily  Substance and Sexual Activity  . Alcohol use: Yes    Alcohol/week: 7.0 standard drinks    Types: 7 Cans of beer per week  . Drug use: No  . Sexual activity: Not on file  Other Topics Concern  . Not on file  Social History Narrative   Lives locally by himself.  Does not routinely exercise.   Social Determinants of Health   Financial Resource Strain:   . Difficulty of Paying Living Expenses: Not on file  Food Insecurity:   . Worried About [employer] of  Food in the Last Year: Not on file  . Ran Out of Food in the Last Year: Not on file  Transportation Needs:   . Lack of Transportation (Medical): Not on file  . Lack of Transportation (Non-Medical): Not on file  Physical Activity:   . Days of Exercise per Week: Not on file  . Minutes of Exercise per Session: Not on file  Stress:   . Feeling of Stress : Not on file  Social Connections:   . Frequency of Communication with Friends and Family: Not on file  . Frequency of Social Gatherings with Friends and Family: Not on file  . Attends Religious Services: Not on file  . Active Member of Clubs or Organizations: Not on file  . Attends Archivist Meetings: Not on file  . Marital Status: Not on file     Family History:  The patient's family history includes Healthy in his sister; Heart disease in his brother and father. There is no history of Stroke.  ROS:   Review of Systems  Constitutional: Positive for malaise/fatigue. Negative for chills, diaphoresis, fever and weight loss.  HENT: Negative for congestion.   Eyes: Negative for discharge and redness.  Respiratory: Negative for cough, hemoptysis, sputum production, shortness of breath and wheezing.   Cardiovascular: Negative for chest pain, palpitations, orthopnea, claudication, leg swelling and PND.  Gastrointestinal: Positive for constipation. Negative for  abdominal pain, blood in stool, diarrhea, heartburn, melena, nausea and vomiting.  Genitourinary: Negative for hematuria.  Musculoskeletal: Negative for falls and myalgias.  Skin: Negative for rash.  Neurological: Positive for weakness. Negative for dizziness, tingling, tremors, sensory change, speech change, focal weakness and loss of consciousness.  Endo/Heme/Allergies: Does not bruise/bleed easily.  Psychiatric/Behavioral: Negative for substance abuse. The patient is not nervous/anxious.   All other systems reviewed and are negative.    EKGs/Labs/Other Studies Reviewed:    Studies reviewed were summarized above. The additional studies were reviewed today:  Zio patch 09/2019: Predominant rhythm of sinus with an average heart rate of 56 bpm with a range of 50 to 97 bpm.  First-degree AV block was noted.  2 atrial runs occurred with the fastest being 4 beats with a maximum rate of 97 bpm and the longest lasting 5 beats with an average rate of 92 bpm.  Isolated PACs, atrial couplets, PVCs, and ventricular couplets were rare.  Patient triggered events corresponded to sinus rhythm along with short atrial runs. __________  2D echo 05/2019: 1. The left ventricle has low normal systolic function, with an ejection  fraction of 50-55%. The cavity size was normal. There is moderately  increased left ventricular wall thickness. Left ventricular diastolic  Doppler parameters are consistent with  pseudonormalization. Elevated mean left atrial pressure No evidence of  left ventricular regional wall motion abnormalities.  2. The right ventricle has moderately reduced systolic function. The  cavity was mildly enlarged. There is no increase in right ventricular wall  thickness. Right ventricular systolic pressure is moderately elevated with  an estimated pressure of 56.7  mmHg.  3. Left atrial size was severely dilated.  4. The aortic valve is tricuspid.  5. The aorta is normal unless otherwise  noted.  6. Mildly dilated pulmonary artery.  7. The inferior vena cava was dilated in size with <50% respiratory  variability.  8. The interatrial septum was not well visualized. __________  Carlton Adam MPI 05/2019: Pharmacological myocardial perfusion imaging study with no significant ischemia Small mild fixed apical perfusion defect, unable to  exclude attenuation artifact vs small prior MI. Normal wall motion, EF estimated at 40% (decreased EF secondary to GI uptake artifact) No EKG changes concerning for ischemia at peak stress or in recovery. Baseline EKG with ST abn in V5 and V6, more pronounced in V4 to V6 with lexiscan infusion Low risk scan   EKG:  EKG is ordered today.  The EKG ordered today demonstrates sinus bradycardia, 57 bpm, first-degree AV block, left atrial enlargement, LVH with nonspecific IVCD, QTc 502 ms  Recent Labs: 05/28/2019: B Natriuretic Peptide >4,500.0 07/17/2019: Magnesium 2.5 10/09/2019: ALT 11; BUN 40; Creatinine, Ser 8.53; Hemoglobin 12.0; Platelets 330; Sodium 132; TSH 1.230 10/10/2019: Potassium 4.3  Recent Lipid Panel    Component Value Date/Time   CHOL 137 05/29/2019 0340   CHOL 143 10/30/2013 0543   TRIG 55 05/29/2019 0340   TRIG 62 10/30/2013 0543   HDL 72 05/29/2019 0340   HDL 65 (H) 10/30/2013 0543   CHOLHDL 1.9 05/29/2019 0340   VLDL 11 05/29/2019 0340   VLDL 12 10/30/2013 0543   LDLCALC 54 05/29/2019 0340   LDLCALC 28 11/15/2017 0850   LDLCALC 66 10/30/2013 0543    PHYSICAL EXAM:    VS:  BP 118/72 (BP Location: Left Arm, Patient Position: Sitting, Cuff Size: Normal)   Pulse (!) 57   Ht 5\' 7"  (1.702 m)   Wt 150 lb 8 oz (68.3 kg)   SpO2 98%   BMI 23.57 kg/m   BMI: Body mass index is 23.57 kg/m.  Physical Exam  Constitutional: He is oriented to person, place, and time. He appears well-developed and well-nourished.  HENT:  Head: Normocephalic and atraumatic.  Eyes: Right eye exhibits no discharge. Left eye exhibits no discharge.    Neck: No JVD present.  Cardiovascular: Normal rate, regular rhythm, S1 normal, S2 normal and normal heart sounds. Exam reveals no distant heart sounds, no friction rub, no midsystolic click and no opening snap.  No murmur heard. Pulses:      Posterior tibial pulses are 1+ on the right side and 1+ on the left side.  Pulmonary/Chest: Effort normal and breath sounds normal. No respiratory distress. He has no decreased breath sounds. He has no wheezes. He has no rales. He exhibits no tenderness.  Abdominal: Soft. He exhibits no distension. There is no abdominal tenderness.  Musculoskeletal:        General: No edema.     Cervical back: Normal range of motion.  Neurological: He is alert and oriented to person, place, and time.  Skin: Skin is warm and dry. No cyanosis. Nails show no clubbing.  Psychiatric: He has a normal mood and affect. His speech is normal and behavior is normal. Judgment and thought content normal.    Wt Readings from Last 3 Encounters:  11/06/19 150 lb 8 oz (68.3 kg)  10/11/19 153 lb 12.8 oz (69.8 kg)  10/09/19 150 lb 12 oz (68.4 kg)     ASSESSMENT & PLAN:   1. CAD status post CABG without angina: He is doing well without any symptoms concerning for angina.  Recent Lexiscan MPI without significant ischemia and low risk as outlined above.  Restart aspirin 81 mg daily with discontinuation of Eliquis as outlined below.  He will otherwise continue amlodipine, atorvastatin, and carvedilol.  No plan for further ischemic evaluation at this time.  2. Lone A. Fib: Patient was noted to have brief episode of A. fib during acute illness as outlined above.  Subsequent outpatient cardiac monitoring has shown  no recurrence of significant A. fib.  Given this, and in the setting of history of hematochezia and hemoptysis as well as with the patient being high bleeding risk secondary to dialysis, after discussion with his primary cardiologist, we have agreed to discontinue Eliquis.  Also,  given this was an isolated episode of A. fib in the setting of acute illness, and with slightly prolonged QTc we have discontinued amiodarone.  With his sinus bradycardia and first-degree AV block we will decrease carvedilol to 12.5 mg twice daily.    3. HFpEF/pulmonary hypertension: Volume is managed by hemodialysis 4 days/week with some daily urine output.  His pulmonary pretension is likely in the setting of underlying lung disease with COPD secondary to tobacco use.  Remains on Lasix 80 mg twice daily.  4. ESRD: Compliant with dialysis 4 days/week.  Followed by nephrology.  5. Anemia/hematochezia/hemoptysis: Patient indicates symptoms have resolved.  Most recent CBC showed stable hemoglobin of 12.  6. HTN: Blood pressure is well controlled today.  Continue reduced dose carvedilol as outlined above along with amlodipine, hydralazine, clonidine and Lasix.  7. HLD: LDL of 54 from 05/2019, with normal liver function from 09/2019.  Continue atorvastatin 20 mg daily.  8. Polysubstance use: Complete cessation of alcohol and tobacco is advised.  He has previously been taken off the [facility] list [facility] due to ongoing substance use.  9. Preoperative cardiac evaluation: Patient is scheduled for dialysis permacath later this week.  We have discontinued Eliquis as outlined above and started the patient back on aspirin given his history of CAD.  Recent Lexiscan Myoview showed no significant ischemia and was overall a low risk.  Echo showed low normal LV systolic function as outlined above.  He does not have any complaints of anginal symptoms at this time.  At baseline, his functional status is quite limited secondary to his comorbid conditions.  That said, he does indicate to me he is able to achieve 4 METs.  Per Revised Cardiac Index, he is high risk for noncardiac procedure with an estimated rate of greater than 11% for MI, pulmonary edema, VF, cardiac arrest, or complete heart block.  No further  cardiac testing will mitigate this risk at this time, and he may proceed at an overall high risk.  10. Medication management: Patient does admit to some confusion regarding his medications.  We will reach out to his pharmacy and see if they can supply him with blister packs of medication.  If not, would recommend he get established with home health to assist with his daily medications and other ADLs as indicated.     Disposition: F/u with Dr. Fletcher Anon or an APP in 2 months.   Medication Adjustments/Labs and Tests Ordered: Current medicines are reviewed at length with the patient today.  Concerns regarding medicines are outlined above. Medication changes, Labs and Tests ordered today are summarized above and listed in the Patient Instructions accessible in Encounters.   Signed, Christell Faith, PA-C 11/06/2019 11:11 AM     [address] [place], [place] [address] [phone]

## 2019-11-05 DIAGNOSIS — E8779 Other fluid overload: Secondary | ICD-10-CM | POA: Diagnosis not present

## 2019-11-05 DIAGNOSIS — D631 Anemia in chronic kidney disease: Secondary | ICD-10-CM | POA: Diagnosis not present

## 2019-11-05 DIAGNOSIS — Z992 Dependence on renal dialysis: Secondary | ICD-10-CM | POA: Diagnosis not present

## 2019-11-05 DIAGNOSIS — N186 End stage renal disease: Secondary | ICD-10-CM | POA: Diagnosis not present

## 2019-11-05 DIAGNOSIS — D509 Iron deficiency anemia, unspecified: Secondary | ICD-10-CM | POA: Diagnosis not present

## 2019-11-05 DIAGNOSIS — N2581 Secondary hyperparathyroidism of renal origin: Secondary | ICD-10-CM | POA: Diagnosis not present

## 2019-11-06 ENCOUNTER — Other Ambulatory Visit: Payer: Self-pay

## 2019-11-06 ENCOUNTER — Ambulatory Visit (INDEPENDENT_AMBULATORY_CARE_PROVIDER_SITE_OTHER): Payer: Medicare Other | Admitting: Physician Assistant

## 2019-11-06 ENCOUNTER — Encounter: Payer: Self-pay | Admitting: Physician Assistant

## 2019-11-06 ENCOUNTER — Telehealth: Payer: Self-pay | Admitting: Licensed Clinical Social Worker

## 2019-11-06 ENCOUNTER — Other Ambulatory Visit
Admission: RE | Admit: 2019-11-06 | Discharge: 2019-11-06 | Disposition: A | Discharge status: Home / Self Care | Payer: Medicare Other | Source: Ambulatory Visit | Attending: Vascular Surgery | Admitting: Vascular Surgery

## 2019-11-06 VITALS — BP 118/72 | HR 57 | Ht 67.0 in | Wt 150.5 lb

## 2019-11-06 DIAGNOSIS — I5032 Chronic diastolic (congestive) heart failure: Secondary | ICD-10-CM

## 2019-11-06 DIAGNOSIS — Z01812 Encounter for preprocedural laboratory examination: Secondary | ICD-10-CM | POA: Diagnosis not present

## 2019-11-06 DIAGNOSIS — D631 Anemia in chronic kidney disease: Secondary | ICD-10-CM | POA: Diagnosis not present

## 2019-11-06 DIAGNOSIS — Z20822 Contact with and (suspected) exposure to covid-19: Secondary | ICD-10-CM | POA: Diagnosis not present

## 2019-11-06 DIAGNOSIS — D509 Iron deficiency anemia, unspecified: Secondary | ICD-10-CM | POA: Diagnosis not present

## 2019-11-06 DIAGNOSIS — E8779 Other fluid overload: Secondary | ICD-10-CM | POA: Diagnosis not present

## 2019-11-06 DIAGNOSIS — I272 Pulmonary hypertension, unspecified: Secondary | ICD-10-CM

## 2019-11-06 DIAGNOSIS — N186 End stage renal disease: Secondary | ICD-10-CM

## 2019-11-06 DIAGNOSIS — I2510 Atherosclerotic heart disease of native coronary artery without angina pectoris: Secondary | ICD-10-CM | POA: Diagnosis not present

## 2019-11-06 DIAGNOSIS — N2581 Secondary hyperparathyroidism of renal origin: Secondary | ICD-10-CM | POA: Diagnosis not present

## 2019-11-06 DIAGNOSIS — Z79899 Other long term (current) drug therapy: Secondary | ICD-10-CM

## 2019-11-06 DIAGNOSIS — Z992 Dependence on renal dialysis: Secondary | ICD-10-CM

## 2019-11-06 DIAGNOSIS — I10 Essential (primary) hypertension: Secondary | ICD-10-CM

## 2019-11-06 DIAGNOSIS — F1910 Other psychoactive substance abuse, uncomplicated: Secondary | ICD-10-CM

## 2019-11-06 DIAGNOSIS — Z951 Presence of aortocoronary bypass graft: Secondary | ICD-10-CM | POA: Diagnosis not present

## 2019-11-06 DIAGNOSIS — D638 Anemia in other chronic diseases classified elsewhere: Secondary | ICD-10-CM

## 2019-11-06 DIAGNOSIS — I482 Chronic atrial fibrillation, unspecified: Secondary | ICD-10-CM | POA: Diagnosis not present

## 2019-11-06 DIAGNOSIS — I4891 Unspecified atrial fibrillation: Secondary | ICD-10-CM | POA: Diagnosis not present

## 2019-11-06 DIAGNOSIS — E785 Hyperlipidemia, unspecified: Secondary | ICD-10-CM

## 2019-11-06 DIAGNOSIS — Z01810 Encounter for preprocedural cardiovascular examination: Secondary | ICD-10-CM

## 2019-11-06 LAB — SARS CORONAVIRUS 2 (TAT 6-24 HRS): SARS Coronavirus 2: NEGATIVE

## 2019-11-06 MED ORDER — ASPIRIN EC 81 MG PO TBEC: 81.0000 mg | DELAYED_RELEASE_TABLET | Freq: Every day | ORAL | 3 refills | Status: AC

## 2019-11-06 MED ORDER — CARVEDILOL 12.5 MG PO TABS: 12.5000 mg | ORAL_TABLET | Freq: Two times a day (BID) | ORAL | 3 refills | Status: DC

## 2019-11-06 NOTE — Telephone Encounter (Signed)
CSW received consult from [facility] regarding medication assistance.  Per consult pt was having his medications managed by his son but the son has now moved out and pt needing assistance in managing his medications at home.  Pt current pharmacy, [facility], does not provide pillpack service- MD office already looked into [facility] but apparently there would be an expense for the pillpack service which the patient is not comfortable with.  CSW looked into to other pillpack/delivery service options and informed that [facility] does not charge for pillpack service- CSW called pt and left message at both phone numbers to discuss other pharmacy option as well as the Extra Help program to try and reduce overall pharmacy costs for patient- awaiting return call  CSW will continue to follow and assist as needed  Jorge Ny, [facility] Desk#: [phone] Cell#: [phone]

## 2019-11-06 NOTE — Patient Instructions (Signed)
Medication Instructions:  1- STOP Amiodarone 2- STOP ELiquis 3- START Aspirin 1 tablet ( 81 mg total) once daily 4- DECREASE Coreg Take 1 tablet (12.5 mg total) by mouth 2 (two) times daily with a meal *If you need a refill on your cardiac medications before your next appointment, please call your pharmacy*  Lab Work: None ordered  If you have labs (blood work) drawn today and your tests are completely normal, you will receive your results only by: [name] MyChart Message (if you have MyChart) OR . A paper copy in the mail If you have any lab test that is abnormal or we need to change your treatment, we will call you to review the results.  Testing/Procedures: None ordered   Follow-Up: At [facility], you and your health needs are our priority.  As part of our continuing mission to provide you with exceptional heart care, we have created designated Provider Care Teams.  These Care Teams include your primary Cardiologist (physician) and Advanced Practice Providers (APPs -  Physician Assistants and Nurse Practitioners) who all work together to provide you with the care you need, when you need it.  Your next appointment:   2 month(s)  The format for your next appointment:   In Person  Provider:    You may see Kathlyn Sacramento, MD or Christell Faith, PA-C.   Other Instructions [facility] tomorrow after 10 to speak to [facility] about pill packs.  [phone]

## 2019-11-07 ENCOUNTER — Other Ambulatory Visit (INDEPENDENT_AMBULATORY_CARE_PROVIDER_SITE_OTHER): Payer: Self-pay | Admitting: Nurse Practitioner

## 2019-11-07 DIAGNOSIS — D509 Iron deficiency anemia, unspecified: Secondary | ICD-10-CM | POA: Diagnosis not present

## 2019-11-07 DIAGNOSIS — Z992 Dependence on renal dialysis: Secondary | ICD-10-CM | POA: Diagnosis not present

## 2019-11-07 DIAGNOSIS — E8779 Other fluid overload: Secondary | ICD-10-CM | POA: Diagnosis not present

## 2019-11-07 DIAGNOSIS — N186 End stage renal disease: Secondary | ICD-10-CM | POA: Diagnosis not present

## 2019-11-07 DIAGNOSIS — D631 Anemia in chronic kidney disease: Secondary | ICD-10-CM | POA: Diagnosis not present

## 2019-11-07 DIAGNOSIS — N2581 Secondary hyperparathyroidism of renal origin: Secondary | ICD-10-CM | POA: Diagnosis not present

## 2019-11-08 ENCOUNTER — Ambulatory Visit
Admission: RE | Admit: 2019-11-08 | Discharge: 2019-11-08 | Disposition: A | Discharge status: Home / Self Care | Payer: Medicare Other | Attending: Vascular Surgery | Admitting: Vascular Surgery

## 2019-11-08 ENCOUNTER — Other Ambulatory Visit: Payer: Self-pay

## 2019-11-08 ENCOUNTER — Encounter
Admission: RE | Disposition: A | Discharge status: Home / Self Care | Payer: Self-pay | Source: Home / Self Care | Attending: Vascular Surgery

## 2019-11-08 DIAGNOSIS — F17210 Nicotine dependence, cigarettes, uncomplicated: Secondary | ICD-10-CM | POA: Insufficient documentation

## 2019-11-08 DIAGNOSIS — I120 Hypertensive chronic kidney disease with stage 5 chronic kidney disease or end stage renal disease: Secondary | ICD-10-CM | POA: Diagnosis not present

## 2019-11-08 DIAGNOSIS — I2510 Atherosclerotic heart disease of native coronary artery without angina pectoris: Secondary | ICD-10-CM | POA: Diagnosis not present

## 2019-11-08 DIAGNOSIS — N185 Chronic kidney disease, stage 5: Secondary | ICD-10-CM

## 2019-11-08 DIAGNOSIS — Z888 Allergy status to other drugs, medicaments and biological substances status: Secondary | ICD-10-CM | POA: Insufficient documentation

## 2019-11-08 DIAGNOSIS — T82868A Thrombosis of vascular prosthetic devices, implants and grafts, initial encounter: Secondary | ICD-10-CM | POA: Insufficient documentation

## 2019-11-08 DIAGNOSIS — Z8249 Family history of ischemic heart disease and other diseases of the circulatory system: Secondary | ICD-10-CM | POA: Insufficient documentation

## 2019-11-08 DIAGNOSIS — Z992 Dependence on renal dialysis: Secondary | ICD-10-CM | POA: Diagnosis not present

## 2019-11-08 DIAGNOSIS — N186 End stage renal disease: Secondary | ICD-10-CM | POA: Diagnosis not present

## 2019-11-08 DIAGNOSIS — Y841 Kidney dialysis as the cause of abnormal reaction of the patient, or of later complication, without mention of misadventure at the time of the procedure: Secondary | ICD-10-CM | POA: Diagnosis not present

## 2019-11-08 HISTORY — PX: DIALYSIS/PERMA CATHETER INSERTION: CATH118288

## 2019-11-08 SURGERY — DIALYSIS/PERMA CATHETER INSERTION
Anesthesia: Moderate Sedation

## 2019-11-08 MED ORDER — FENTANYL CITRATE (PF) 100 MCG/2ML IJ SOLN
INTRAMUSCULAR | Status: DC | PRN
  Administered 2019-11-08: 50 ug via INTRAVENOUS

## 2019-11-08 MED ORDER — SODIUM CHLORIDE 0.9 % IV SOLN: INTRAVENOUS | Status: DC

## 2019-11-08 MED ORDER — FAMOTIDINE 20 MG PO TABS: 40.0000 mg | ORAL_TABLET | Freq: Once | ORAL | Status: DC | PRN

## 2019-11-08 MED ORDER — CEFAZOLIN SODIUM-DEXTROSE 1-4 GM/50ML-% IV SOLN: 1.0000 g | Freq: Once | INTRAVENOUS | Status: AC

## 2019-11-08 MED ORDER — HYDROMORPHONE HCL 1 MG/ML IJ SOLN: 1.0000 mg | Freq: Once | INTRAMUSCULAR | Status: DC | PRN

## 2019-11-08 MED ORDER — CEFAZOLIN SODIUM-DEXTROSE 1-4 GM/50ML-% IV SOLN
INTRAVENOUS | Status: AC
  Administered 2019-11-08: 14:00:00 1 g via INTRAVENOUS
  Filled 2019-11-08: qty 50

## 2019-11-08 MED ORDER — ONDANSETRON HCL 4 MG/2ML IJ SOLN: 4.0000 mg | Freq: Four times a day (QID) | INTRAMUSCULAR | Status: DC | PRN

## 2019-11-08 MED ORDER — MIDAZOLAM HCL 5 MG/5ML IJ SOLN
INTRAMUSCULAR | Status: AC
  Filled 2019-11-08: qty 5

## 2019-11-08 MED ORDER — MIDAZOLAM HCL 2 MG/2ML IJ SOLN
INTRAMUSCULAR | Status: DC | PRN
  Administered 2019-11-08: 2 mg via INTRAVENOUS

## 2019-11-08 MED ORDER — MIDAZOLAM HCL 2 MG/ML PO SYRP: 8.0000 mg | ORAL_SOLUTION | Freq: Once | ORAL | Status: DC | PRN

## 2019-11-08 MED ORDER — HEPARIN SODIUM (PORCINE) 1000 UNIT/ML IJ SOLN
INTRAMUSCULAR | Status: AC
  Filled 2019-11-08: qty 1

## 2019-11-08 MED ORDER — FENTANYL CITRATE (PF) 100 MCG/2ML IJ SOLN
INTRAMUSCULAR | Status: AC
  Filled 2019-11-08: qty 2

## 2019-11-08 MED ORDER — DIPHENHYDRAMINE HCL 50 MG/ML IJ SOLN: 50.0000 mg | Freq: Once | INTRAMUSCULAR | Status: DC | PRN

## 2019-11-08 MED ORDER — METHYLPREDNISOLONE SODIUM SUCC 125 MG IJ SOLR: 125.0000 mg | Freq: Once | INTRAMUSCULAR | Status: DC | PRN

## 2019-11-08 SURGICAL SUPPLY — 5 items
CATH PALINDROME-P 19CM W/VT (CATHETERS) ×3 IMPLANT
GUIDEWIRE SUPER STIFF .035X180 (WIRE) ×3 IMPLANT
PACK ANGIOGRAPHY (CUSTOM PROCEDURE TRAY) ×3 IMPLANT
SUT MNCRL AB 4-0 PS2 18 (SUTURE) ×3 IMPLANT
SUT PROLENE 0 CT 1 30 (SUTURE) ×3 IMPLANT

## 2019-11-08 NOTE — Op Note (Signed)
OPERATIVE NOTE    PRE-OPERATIVE DIAGNOSIS: 1. ESRD 2. Non-functional permcath  POST-OPERATIVE DIAGNOSIS: same as above  PROCEDURE: 1. Fluoroscopic guidance for placement of catheter 2. Placement of a 19 cm tip to cuff tunneled hemodialysis catheter via the right internal jugular vein and removal of previous catheter  SURGEON: Leotis Pain, MD  ANESTHESIA:  Local with moderate conscious sedation for 15 minutes using 2 mg of Versed and 50 mcg of Fentanyl  ESTIMATED BLOOD LOSS: 3 cc  FINDING(S): none  SPECIMEN(S):  None  INDICATIONS:   Patient is a 59 y.o.male who presents with non-functional dialysis catheter and ESRD.  The patient needs long term dialysis access for their ESRD, and a Permcath is necessary.  Risks and benefits are discussed and informed consent is obtained.    DESCRIPTION: After obtaining full informed written consent, the patient was brought back to the vascular suite. The patient received moderate conscious sedation during a face-to-face encounter with me present throughout the entire procedure and supervising the RN monitoring the vital signs, pulse oximetry, telemetry, and mental status throughout the entire procedure. The patient's existing catheter, right neck and chest were sterilely prepped and draped in a sterile surgical field was created.  The existing catheter was dissected free from the fibrous sheath securing the cuff with hemostats and blunt dissection.  A wire was placed. The existing catheter was then removed and the wire used to keep venous access. I selected a 19 cm tip to cuff tunneled dialysis catheter.  Using fluoroscopic guidance the catheter tips were parked in the right atrium. The appropriate distal connectors were placed. It withdrew blood well and flushed easily with heparinized saline and a concentrated heparin solution was then placed. It was secured to the chest wall with 2 Prolene sutures. A 4-0 Monocryl pursestring suture was placed around the  exit site. Sterile dressings were placed. The patient tolerated the procedure well and was taken to the recovery room in stable condition.  COMPLICATIONS: None  CONDITION: Stable  Leotis Pain 11/08/2019 2:15 PM   This note was created with Dragon Medical transcription system. Any errors in dictation are purely unintentional.

## 2019-11-08 NOTE — H&P (Signed)
[facility] Admission History & Physical  MRN : [id number]  Daryl Weeks is a 59 y.o. (02-20-1961) male who presents with chief complaint of non-functioning permcath.  History of Present Illness:  I am asked to evaluate the patient by the dialysis center. The patient was sent here because they were unable to achieve adequate dialysis yesterday. Furthermore the Center states they were unable to aspirate either lumen of the catheter yesterday.  This problem has been getting worse for about 1 week. The patient is unaware of any other change.   Patient denies pain or tenderness overlying the access.  There is no pain with dialysis.  Patient denies fevers or shaking chills while on dialysis.    There have multiple any past interventions and declots of his multiple different access.    The patient presents for a scheduled PermCath exchange.  Current Facility-Administered Medications  Medication Dose Route Frequency Provider Last Rate Last Admin  . 0.9 %  sodium chloride infusion   Intravenous Continuous Kris Hartmann, NP 10 mL/hr at 11/08/19 1200 New Bag at 11/08/19 1200  . ceFAZolin (ANCEF) 1-4 GM/50ML-% IVPB           . ceFAZolin (ANCEF) IVPB 1 g/50 mL premix  1 g Intravenous Once Kris Hartmann, NP      . diphenhydrAMINE (BENADRYL) injection 50 mg  50 mg Intravenous Once PRN Kris Hartmann, NP      . famotidine (PEPCID) tablet 40 mg  40 mg Oral Once PRN Kris Hartmann, NP      . HYDROmorphone (DILAUDID) injection 1 mg  1 mg Intravenous Once PRN Eulogio Ditch E, NP      . methylPREDNISolone sodium succinate (SOLU-MEDROL) 125 mg/2 mL injection 125 mg  125 mg Intravenous Once PRN Eulogio Ditch E, NP      . midazolam (VERSED) 2 MG/ML syrup 8 mg  8 mg Oral Once PRN Kris Hartmann, NP      . ondansetron (ZOFRAN) injection 4 mg  4 mg Intravenous Q6H PRN Kris Hartmann, NP       Past Surgical History:  Procedure Laterality Date  . A/V FISTULAGRAM Left 10/28/2016    Procedure: A/V Fistulagram;  Surgeon: [name], MD;  Location: [facility];  Service: Cardiovascular;  Laterality: Left;  . A/V FISTULAGRAM Left 12/07/2018   Procedure: A/V FISTULAGRAM;  Surgeon: [name], MD;  Location: [facility];  Service: Cardiovascular;  Laterality: Left;  . A/V FISTULAGRAM N/A 06/04/2019   Procedure: A/V Fistulagram- LEFT;  Surgeon: [name], MD;  Location: [facility];  Service: Cardiovascular;  Laterality: N/A;  . A/V SHUNT INTERVENTION N/A 10/28/2016   Procedure: A/V Shunt Intervention;  Surgeon: [name], MD;  Location: [facility];  Service: Cardiovascular;  Laterality: N/A;  . A/V SHUNTOGRAM Left 02/01/2019   Procedure: A/V SHUNTOGRAM;  Surgeon: [name], MD;  Location: [facility];  Service: Cardiovascular;  Laterality: Left;  . AV FISTULA PLACEMENT    . CARDIAC CATHETERIZATION     RCA 90% and calcified mid LAD 80% Stenosis  . CORONARY ARTERY BYPASS GRAFT N/A 11/29/2013   Procedure: CORONARY ARTERY BYPASS GRAFTING (CABG) x 4 using endoscopically harvested right saphenous vein and left internal mammary artery;  Surgeon: [name], MD;  Location: [facility];  Service: Open Heart Surgery;  Laterality: N/A;  . dialysis catheterr  2/15  . DIALYSIS/PERMA CATHETER INSERTION  N/A 07/16/2019   Procedure: DIALYSIS/PERMA CATHETER INSERTION WITH VAC CHANGE UNDER SEDATION;  Surgeon: [name], MD;  Location: [facility];  Service: Cardiovascular;  Laterality: N/A;  . DIALYSIS/PERMA CATHETER REMOVAL N/A 02/15/2019   Procedure: DIALYSIS/PERMA CATHETER REMOVAL;  Surgeon: [name], MD;  Location: [facility];  Service: Cardiovascular;  Laterality: N/A;  . INSERTION OF DIALYSIS CATHETER N/A 12/14/2018   Procedure: INSERTION OF DIALYSIS CATHETER ( [place] );  Surgeon: [name], MD;  Location: [facility];  Service: Vascular;  Laterality: N/A;  . INTRAOPERATIVE TRANSESOPHAGEAL ECHOCARDIOGRAM N/A 11/29/2013    Procedure: INTRAOPERATIVE TRANSESOPHAGEAL ECHOCARDIOGRAM;  Surgeon: [name], MD;  Location: [facility];  Service: Open Heart Surgery;  Laterality: N/A;  . LIGATION OF ARTERIOVENOUS  FISTULA Left 07/13/2019   Procedure: LIGATION OF ARTERIOVENOUS  FISTULA;  Surgeon: [name], MD;  Location: [facility];  Service: Vascular;  Laterality: Left;  . PERIPHERAL VASCULAR CATHETERIZATION N/A 06/12/2015   Procedure: A/V Shuntogram/Fistulagram;  Surgeon: [name], MD;  Location: [facility];  Service: Cardiovascular;  Laterality: N/A;  . PERIPHERAL VASCULAR CATHETERIZATION Left 06/12/2015   Procedure: A/V Shunt Intervention;  Surgeon: [name], MD;  Location: [facility];  Service: Cardiovascular;  Laterality: Left;  . RENAL BIOPSY Left 14  . REVISON OF ARTERIOVENOUS FISTULA Left 12/14/2018   Procedure: REVISON OF ARTERIOVENOUS FISTULA;  Surgeon: [name], MD;  Location: [facility];  Service: Vascular;  Laterality: Left;  . UPPER EXTREMITY VENOGRAPHY Bilateral 09/18/2019   Procedure: UPPER EXTREMITY VENOGRAPHY;  Surgeon: Katha Cabal, MD;  Location: [facility];  Service: Cardiovascular;  Laterality: Bilateral;   Social History Social History   Tobacco Use  . Smoking status: Current Every Day Smoker    Packs/day: 0.50    Years: 32.00    Pack years: 16.00    Types: Cigarettes  . Smokeless tobacco: Never Used  . Tobacco comment: daily  Substance Use Topics  . Alcohol use: Yes    Alcohol/week: 7.0 standard drinks    Types: 7 Cans of beer per week  . Drug use: No   Family History Family History  Problem Relation Age of Onset  . Heart disease Father   . Heart disease Brother   . Healthy Sister   . Stroke Neg Hx   No family history of bleeding or clotting disorders, autoimmune disease or porphyria.  Allergies  Allergen Reactions  . Lisinopril    REVIEW OF SYSTEMS (Negative unless checked)  Constitutional: [] Weight loss  [] Fever  [] Chills Cardiac:  [] Chest pain   [] Chest pressure   [] Palpitations   [] Shortness of breath when laying flat   [] Shortness of breath at rest   [x] Shortness of breath with exertion. Vascular:  [] Pain in legs with walking   [] Pain in legs at rest   [] Pain in legs when laying flat   [] Claudication   [] Pain in feet when walking  [] Pain in feet at rest  [] Pain in feet when laying flat   [] History of DVT   [] Phlebitis   [x] Swelling in legs   [] Varicose veins   [] Non-healing ulcers Pulmonary:   [] Uses home oxygen   [] Productive cough   [] Hemoptysis   [] Wheeze  [] COPD   [] Asthma Neurologic:  [] Dizziness  [] Blackouts   [] Seizures   [] History of stroke   [] History of TIA  [] Aphasia   [] Temporary blindness   [] Dysphagia   [] Weakness or numbness in arms   []   Weakness or numbness in legs Musculoskeletal:  [] Arthritis   [] Joint swelling   [] Joint pain   [] Low back pain Hematologic:  [] Easy bruising  [] Easy bleeding   [] Hypercoagulable state   [] Anemic  [] Hepatitis Gastrointestinal:  [] Blood in stool   [] Vomiting blood  [] Gastroesophageal reflux/heartburn   [] Difficulty swallowing. Genitourinary:  [x] Chronic kidney disease   [] Difficult urination  [] Frequent urination  [] Burning with urination   [] Blood in urine Skin:  [] Rashes   [] Ulcers   [] Wounds Psychological:  [] History of anxiety   []  History of major depression.  Physical Examination  Vitals:   11/08/19 1128  BP: (!) 154/85  Pulse: (!) 55  Resp: 18  Temp: 97.7 F (36.5 C)  TempSrc: Oral  SpO2: 98%  Weight: 68 kg  Height: 5\' 7"  (1.702 m)   Body mass index is 23.49 kg/m. Gen: WD/WN, NAD Head: [place]/AT, No temporalis wasting. Prominent temp pulse not noted. Ear/Nose/Throat: Hearing grossly intact, nares w/o erythema or drainage, oropharynx w/o Erythema/Exudate,  Eyes: Conjunctiva clear, sclera non-icteric Neck: Trachea midline.  No JVD.  Pulmonary:  Good air movement, respirations not labored, no use of accessory muscles.  Cardiac: RRR, normal S1, S2. Vascular:   tunneled catheter without tenderness or drainage Vessel Right Left  Radial Palpable Palpable  Gastrointestinal: soft, non-tender/non-distended. No guarding/reflex.  Musculoskeletal: M/S 5/5 throughout.  Extremities without ischemic changes.  No deformity or atrophy.  Neurologic: Sensation grossly intact in extremities.  Symmetrical.  Speech is fluent. Motor exam as listed above. Psychiatric: Judgment intact, Mood & affect appropriate for pt's clinical situation. Dermatologic: No rashes or ulcers noted.  No cellulitis or open wounds. Lymph : No Cervical, Axillary, or Inguinal lymphadenopathy.  CBC Lab Results  Component Value Date   WBC 12.0 (H) 10/09/2019   HGB 12.0 (L) 10/09/2019   HCT 35.3 (L) 10/09/2019   MCV 101 (H) 10/09/2019   PLT 330 10/09/2019   BMET    Component Value Date/Time   NA 132 (L) 10/09/2019 1433   NA 132 (L) 06/24/2014 0903   K 4.3 10/10/2019 0745   K 4.8 07/11/2014 1046   CL 92 (L) 10/09/2019 1433   CL 96 (L) 06/24/2014 0903   CO2 21 10/09/2019 1433   CO2 21 06/24/2014 0903   GLUCOSE 108 (H) 10/09/2019 1433   GLUCOSE 77 09/24/2019 0355   GLUCOSE 92 06/24/2014 0903   BUN 40 (H) 10/09/2019 1433   BUN 60 (H) 06/24/2014 0903   CREATININE 8.53 (H) 10/09/2019 1433   CREATININE 9.10 (H) 06/24/2014 0903   CALCIUM 10.3 (H) 10/09/2019 1433   CALCIUM 8.8 06/24/2014 0903   GFRNONAA 6 (L) 10/09/2019 1433   GFRNONAA 7 (L) 06/24/2014 0903   GFRNONAA 11 (L) 01/14/2014 1051   GFRAA 7 (L) 10/09/2019 1433   GFRAA 8 (L) 06/24/2014 0903   GFRAA 12 (L) 01/14/2014 1051   CrCl cannot be calculated (Patient's most recent lab result is older than the maximum 21 days allowed.).  COAG Lab Results  Component Value Date   INR 1.2 09/24/2019   INR 1.5 (H) 07/13/2019   INR 1.1 05/30/2019   Radiology No results found.  Assessment/Plan 1.  Complication dialysis device with thrombosis AV access:  Patient's tunneled catheter is thrombosed. The patient will undergo exchange  of the catheter same venous access using interventional techniques.  The risks and benefits were described to the patient.  All questions were answered.  The patient agrees to proceed with intervention.  2.  End-stage renal disease requiring hemodialysis:  Patient will continue dialysis therapy without further interruption if a successful exchange is not achieved then new site will be found for tunneled catheter placement. Dialysis has already been arranged since the patient missed their previous session 3.  Hypertension:  Patient will continue medical management; nephrology is following no changes in oral medications. 4.  Coronary artery disease:  EKG will be monitored. Nitrates will be used if needed. The patient's oral cardiac medications will be continued.  Discussed with Dr. Mayme Genta, PA-C  11/08/2019 1:31 PM

## 2019-11-09 ENCOUNTER — Emergency Department
Admission: EM | Admit: 2019-11-09 | Discharge: 2019-11-09 | Disposition: A | Discharge status: Home / Self Care | Payer: Medicare Other | Attending: Emergency Medicine | Admitting: Emergency Medicine

## 2019-11-09 ENCOUNTER — Telehealth (INDEPENDENT_AMBULATORY_CARE_PROVIDER_SITE_OTHER): Payer: Self-pay

## 2019-11-09 ENCOUNTER — Encounter: Payer: Self-pay | Admitting: Cardiology

## 2019-11-09 ENCOUNTER — Other Ambulatory Visit: Payer: Self-pay

## 2019-11-09 DIAGNOSIS — E1122 Type 2 diabetes mellitus with diabetic chronic kidney disease: Secondary | ICD-10-CM | POA: Diagnosis not present

## 2019-11-09 DIAGNOSIS — Z992 Dependence on renal dialysis: Secondary | ICD-10-CM | POA: Insufficient documentation

## 2019-11-09 DIAGNOSIS — Z7689 Persons encountering health services in other specified circumstances: Secondary | ICD-10-CM | POA: Diagnosis not present

## 2019-11-09 DIAGNOSIS — I2510 Atherosclerotic heart disease of native coronary artery without angina pectoris: Secondary | ICD-10-CM | POA: Insufficient documentation

## 2019-11-09 DIAGNOSIS — N2581 Secondary hyperparathyroidism of renal origin: Secondary | ICD-10-CM | POA: Diagnosis not present

## 2019-11-09 DIAGNOSIS — I10 Essential (primary) hypertension: Secondary | ICD-10-CM | POA: Diagnosis not present

## 2019-11-09 DIAGNOSIS — N186 End stage renal disease: Secondary | ICD-10-CM | POA: Diagnosis not present

## 2019-11-09 DIAGNOSIS — E8779 Other fluid overload: Secondary | ICD-10-CM | POA: Diagnosis not present

## 2019-11-09 DIAGNOSIS — I120 Hypertensive chronic kidney disease with stage 5 chronic kidney disease or end stage renal disease: Secondary | ICD-10-CM | POA: Insufficient documentation

## 2019-11-09 DIAGNOSIS — Z5189 Encounter for other specified aftercare: Secondary | ICD-10-CM

## 2019-11-09 DIAGNOSIS — I132 Hypertensive heart and chronic kidney disease with heart failure and with stage 5 chronic kidney disease, or end stage renal disease: Secondary | ICD-10-CM | POA: Diagnosis not present

## 2019-11-09 DIAGNOSIS — D631 Anemia in chronic kidney disease: Secondary | ICD-10-CM | POA: Diagnosis not present

## 2019-11-09 DIAGNOSIS — T82838A Hemorrhage of vascular prosthetic devices, implants and grafts, initial encounter: Secondary | ICD-10-CM | POA: Diagnosis not present

## 2019-11-09 DIAGNOSIS — J449 Chronic obstructive pulmonary disease, unspecified: Secondary | ICD-10-CM | POA: Insufficient documentation

## 2019-11-09 DIAGNOSIS — D509 Iron deficiency anemia, unspecified: Secondary | ICD-10-CM | POA: Diagnosis not present

## 2019-11-09 DIAGNOSIS — J181 Lobar pneumonia, unspecified organism: Secondary | ICD-10-CM | POA: Diagnosis not present

## 2019-11-09 DIAGNOSIS — I5032 Chronic diastolic (congestive) heart failure: Secondary | ICD-10-CM | POA: Diagnosis not present

## 2019-11-09 DIAGNOSIS — Z79899 Other long term (current) drug therapy: Secondary | ICD-10-CM | POA: Diagnosis not present

## 2019-11-09 DIAGNOSIS — Z950 Presence of cardiac pacemaker: Secondary | ICD-10-CM | POA: Insufficient documentation

## 2019-11-09 DIAGNOSIS — Z743 Need for continuous supervision: Secondary | ICD-10-CM | POA: Diagnosis not present

## 2019-11-09 DIAGNOSIS — R58 Hemorrhage, not elsewhere classified: Secondary | ICD-10-CM | POA: Diagnosis not present

## 2019-11-09 DIAGNOSIS — Z4800 Encounter for change or removal of nonsurgical wound dressing: Secondary | ICD-10-CM | POA: Insufficient documentation

## 2019-11-09 DIAGNOSIS — R279 Unspecified lack of coordination: Secondary | ICD-10-CM | POA: Diagnosis not present

## 2019-11-09 DIAGNOSIS — F17210 Nicotine dependence, cigarettes, uncomplicated: Secondary | ICD-10-CM | POA: Diagnosis not present

## 2019-11-09 NOTE — ED Provider Notes (Signed)
[facility] Emergency Department Provider Note  ____________________________________________   First MD Initiated Contact with Patient 11/09/19 [phone]     (approximate)  I have reviewed the triage vital signs and the nursing notes.   HISTORY  Chief Complaint Post-op Problem    HPI Daryl Weeks is a 59 y.o. male with below list of previous medical conditions including end-stage renal disease currently receiving dialysis Monday Wednesday Friday and Saturday, and dialysis permacath placed by Dr. Lucky Cowboy yesterday presents to the emergency department via EMS from dialysis center secondary to bleeding from around the dialysis catheter since yesterday evening.  Patient states that he has had dressing changed a few times by family and neighbor but wheezing persisted.  Per EMS bleeding controlled at this time however dialysis center wanted the patient to be evaluated.  Of importance the patient recently discontinued taking Eliquis on Tuesday and resume taking aspirin 81 mg as instructed by his doctor.        Past Medical History:  Diagnosis Date  . (HFpEF) heart failure with preserved ejection fraction ([place])    a. 05/2019 Echo: EF 50-55%, diast dysfxn, RVSP 56.44mmHg, Sev dil LA. Mildly dil PA.  [name] Anemia   . Anxiety   . Arthritis   . Colon polyps   . COPD (chronic obstructive pulmonary disease) ([place])   . Coronary artery disease    a. 10/2013 NSTEMI/Cath: Severe 3VD-->CABG x 4 @ [facility] 01/2014 (LIMA->LAD, VG->OM1->OM2, VG->RCA); b. 11/2014 MV: No isch/infarct; c. 05/2019 MV: EF 40% (50-55 by echo), small,mild, fixed apical defect - ? atten vs infarct. No ischemia->low risk.  . ESRD (end stage renal disease) ([place])    a. MWFSat HD  . ETOH abuse    a. 2 beers/night.  [name] GERD (gastroesophageal reflux disease)   . History of pneumonia   . Hyperlipidemia   . Hypertension   . IgA nephropathy   . Lower GI bleed    a. Due to colon polyps. Status post resection of 14 polyps  .  Non-ST elevation MI (NSTEMI) ([place])   . PAF (paroxysmal atrial fibrillation) ([place])    a. Dx 05/2019. CHA2DS2VASc = 3-->Eliquis/Amio.  [name] Rheumatoid arthritis ([place])   . Shortness of breath   . Sleep apnea   . Tobacco abuse     Patient Active Problem List   Diagnosis Date Noted  . Cough with hemoptysis 09/24/2019  . Atrial fibrillation, chronic ([place]) 09/24/2019  . Community acquired pneumonia 09/24/2019  . Hemoptysis 09/24/2019  . Complication of AV dialysis fistula 07/13/2019  . Left lower lobe pneumonia 05/28/2019  . Complication of vascular access for dialysis 11/28/2018  . COPD (chronic obstructive pulmonary disease) ([place]) 12/05/2017  . ESRD on dialysis ([place]) 10/05/2016  . IgA nephropathy 11/05/2014  . S/P CABG x 4 11/29/2013  . Chronic diastolic heart failure ([place]) 11/16/2013  . Coronary artery disease   . Hyperlipidemia   . Hypertensive disorder   . Dyspnea 11/20/2012  . Tobacco abuse     Past Surgical History:  Procedure Laterality Date  . A/V FISTULAGRAM Left 10/28/2016   Procedure: A/V Fistulagram;  Surgeon: [name], MD;  Location: [place] [facility];  Service: Cardiovascular;  Laterality: Left;  . A/V FISTULAGRAM Left 12/07/2018   Procedure: A/V FISTULAGRAM;  Surgeon: [name], MD;  Location: [place] [facility];  Service: Cardiovascular;  Laterality: Left;  . A/V FISTULAGRAM N/A 06/04/2019   Procedure: A/V Fistulagram- LEFT;  Surgeon: [name], MD;  Location:  [place] [facility];  Service: Cardiovascular;  Laterality: N/A;  . A/V SHUNT INTERVENTION N/A 10/28/2016   Procedure: A/V Shunt Intervention;  Surgeon: [name], MD;  Location: [place] [facility];  Service: Cardiovascular;  Laterality: N/A;  . A/V SHUNTOGRAM Left 02/01/2019   Procedure: A/V SHUNTOGRAM;  Surgeon: [name], MD;  Location: [place] [facility];  Service: Cardiovascular;  Laterality: Left;  . AV FISTULA PLACEMENT    . CARDIAC CATHETERIZATION     RCA 90% and calcified mid LAD 80%  Stenosis  . CORONARY ARTERY BYPASS GRAFT N/A 11/29/2013   Procedure: CORONARY ARTERY BYPASS GRAFTING (CABG) x 4 using endoscopically harvested right saphenous vein and left internal mammary artery;  Surgeon: [name], MD;  Location: [place] [facility];  Service: Open Heart Surgery;  Laterality: N/A;  . dialysis catheterr  2/15  . DIALYSIS/PERMA CATHETER INSERTION N/A 07/16/2019   Procedure: DIALYSIS/PERMA CATHETER INSERTION WITH VAC CHANGE UNDER SEDATION;  Surgeon: [name], MD;  Location: [place] [facility];  Service: Cardiovascular;  Laterality: N/A;  . DIALYSIS/PERMA CATHETER REMOVAL N/A 02/15/2019   Procedure: DIALYSIS/PERMA CATHETER REMOVAL;  Surgeon: [name], MD;  Location: [place] [facility];  Service: Cardiovascular;  Laterality: N/A;  . INSERTION OF DIALYSIS CATHETER N/A 12/14/2018   Procedure: INSERTION OF DIALYSIS CATHETER ( [place] );  Surgeon: [name], MD;  Location: [facility];  Service: Vascular;  Laterality: N/A;  . INTRAOPERATIVE TRANSESOPHAGEAL ECHOCARDIOGRAM N/A 11/29/2013   Procedure: INTRAOPERATIVE TRANSESOPHAGEAL ECHOCARDIOGRAM;  Surgeon: [name], MD;  Location: [place] [facility];  Service: Open Heart Surgery;  Laterality: N/A;  . LIGATION OF ARTERIOVENOUS  FISTULA Left 07/13/2019   Procedure: LIGATION OF ARTERIOVENOUS  FISTULA;  Surgeon: [name], MD;  Location: [facility];  Service: Vascular;  Laterality: Left;  . PERIPHERAL VASCULAR CATHETERIZATION N/A 06/12/2015   Procedure: A/V Shuntogram/Fistulagram;  Surgeon: [name], MD;  Location: [place] [facility];  Service: Cardiovascular;  Laterality: N/A;  . PERIPHERAL VASCULAR CATHETERIZATION Left 06/12/2015   Procedure: A/V Shunt Intervention;  Surgeon: [name], MD;  Location: [place] [facility];  Service: Cardiovascular;  Laterality: Left;  . RENAL BIOPSY Left 14  . REVISON OF ARTERIOVENOUS FISTULA Left 12/14/2018   Procedure: REVISON OF ARTERIOVENOUS FISTULA;  Surgeon: [name], MD;  Location: [facility];   Service: Vascular;  Laterality: Left;  . UPPER EXTREMITY VENOGRAPHY Bilateral 09/18/2019   Procedure: UPPER EXTREMITY VENOGRAPHY;  Surgeon: Katha Cabal, MD;  Location: [place] [facility];  Service: Cardiovascular;  Laterality: Bilateral;    Prior to Admission medications   Medication Sig Start Date End Date Taking? Authorizing Provider  albuterol (VENTOLIN HFA) 108 (90 Base) MCG/ACT inhaler Inhale 2 puffs into the lungs every 4 (four) hours as needed for wheezing or shortness of breath.     [provider]  amiodarone (PACERONE) 200 MG tablet Take 200 mg by mouth daily. 09/02/19   [provider]  aspirin EC 81 MG tablet Take 1 tablet (81 mg total) by mouth daily. 11/06/19   Dunn, Areta Haber, PA-C  atorvastatin (LIPITOR) 20 MG tablet take 1 tablet by mouth at bedtime Patient taking differently: Take 20 mg by mouth at bedtime.  04/25/14   Minna Merritts, MD  AURYXIA 1 GM 210 MG(Fe) tablet Take 420 mg by mouth 3 (three) times daily with meals.     [provider]  calcium carbonate (TUMS - DOSED IN MG ELEMENTAL CALCIUM)  500 MG chewable tablet Chew 1 tablet by mouth daily as needed for indigestion or heartburn.     [provider]  carvedilol (COREG) 12.5 MG tablet Take 1 tablet (12.5 mg total) by mouth 2 (two) times daily with a meal. 11/06/19   Dunn, Areta Haber, PA-C  cinacalcet (SENSIPAR) 30 MG tablet Take 30 mg by mouth daily with supper.     [provider]  cloNIDine (CATAPRES) 0.2 MG tablet Take 1 tablet (0.2 mg total) by mouth 3 (three) times daily. 06/05/19   Nicholes Mango, MD  docusate sodium (COLACE) 100 MG capsule Take 1 capsule (100 mg total) by mouth 2 (two) times daily as needed for mild constipation. 06/05/19   Gouru, Illene Silver, MD  Fluticasone Propionate, Inhal, (FLOVENT DISKUS) 100 MCG/BLIST AEPB Inhale 1 puff into the lungs 2 (two) times daily. 10/17/19   Tyler Pita, MD  Fluticasone-Umeclidin-Vilant (TRELEGY ELLIPTA) 100-62.5-25 MCG/INH  AEPB Inhale 1 puff into the lungs daily. 10/16/19   Tyler Pita, MD  furosemide (LASIX) 80 MG tablet Take 80 mg by mouth 2 (two) times daily.     [provider]  gabapentin (NEURONTIN) 100 MG capsule Take 100 mg by mouth 3 (three) times daily.  01/14/18   [provider]  hydrALAZINE (APRESOLINE) 50 MG tablet Take 75 mg by mouth 3 (three) times daily.     [provider]  HYDROcodone-acetaminophen (NORCO/VICODIN) 5-325 MG tablet Take 1 tablet by mouth at bedtime.    [provider]  loratadine (CLARITIN) 10 MG tablet Take 1 tablet (10 mg total) by mouth daily. 06/06/19   Nicholes Mango, MD  multivitamin (RENA-VIT) TABS tablet Take 1 tablet by mouth daily.  05/22/18   [provider]  ondansetron (ZOFRAN-ODT) 4 MG disintegrating tablet Take 4 mg by mouth 2 (two) times daily as needed for nausea.    [provider]  umeclidinium-vilanterol (ANORO ELLIPTA) 62.5-25 MCG/INH AEPB Inhale 1 puff into the lungs daily. 10/17/19   Tyler Pita, MD  zolpidem (AMBIEN) 10 MG tablet Take 10 mg by mouth at bedtime. 07/20/19   [provider]    Allergies Lisinopril  Family History  Problem Relation Age of Onset  . Heart disease Father   . Heart disease Brother   . Healthy Sister   . Stroke Neg Hx     Social History Social History   Tobacco Use  . Smoking status: Current Every Day Smoker    Packs/day: 0.50    Years: 32.00    Pack years: 16.00    Types: Cigarettes  . Smokeless tobacco: Never Used  . Tobacco comment: daily  Substance Use Topics  . Alcohol use: Yes    Alcohol/week: 7.0 standard drinks    Types: 7 Cans of beer per week  . Drug use: No    Review of Systems Constitutional: No fever/chills Eyes: No visual changes. ENT: No sore throat. Cardiovascular: Denies chest pain. Respiratory: Denies shortness of breath. Gastrointestinal: No abdominal pain.  No nausea, no vomiting.  No diarrhea.  No  constipation. Genitourinary: Negative for dysuria. Musculoskeletal: Negative for neck pain.  Negative for back pain. Integumentary: Negative for rash.  Positive for bleeding from dialysis catheter site Neurological: Negative for headaches, focal weakness or numbness.   ____________________________________________   PHYSICAL EXAM:  VITAL SIGNS: ED Triage Vitals  Enc Vitals Group     BP 11/09/19 0628 (!) 161/88     Pulse Rate 11/09/19 0628 60     Resp 11/09/19  0628 20     Temp 11/09/19 0625 (!) 97.4 F (36.3 C)     Temp Source 11/09/19 0625 Oral     SpO2 11/09/19 0628 96 %     Weight 11/09/19 0625 68 kg (150 lb)     Height 11/09/19 0625 1.702 m (5\' 7" )     Head Circumference --      Peak Flow --      Pain Score 11/09/19 0625 2     Pain Loc --      Pain Edu? --      Excl. in [place]? --     Constitutional: Alert and oriented.  Eyes: Conjunctivae are normal.  Mouth/Throat: Patient is wearing a mask. Neck: No stridor.  No meningeal signs.   Chest: No active bleeding from dialysis catheter site.  Appropriate ecchymosis noted around the dialysis catheter site status post placement yesterday and consistent with operative note Cardiovascular: Normal rate, regular rhythm. Good peripheral circulation. Grossly normal heart sounds. Respiratory: Normal respiratory effort.  No retractions. Gastrointestinal: Soft and nontender. No distention.  Musculoskeletal: No lower extremity tenderness nor edema. No gross deformities of extremities. Neurologic:  Normal speech and language. No gross focal neurologic deficits are appreciated.  Skin:  Skin is warm, dry and intact. Psychiatric: Mood and affect are normal. Speech and behavior are normal.  _______________   Procedures   ____________________________________________   INITIAL IMPRESSION / MDM / ASSESSMENT AND PLAN / ED COURSE  As part of my medical decision making, I reviewed the following data within the electronic MEDICAL RECORD NUMBER   59 year old male presented with above-stated history and physical exam secondary to bleeding around dialysis permacath that was placed yesterday.  No active bleeding while in the emergency department.  After discussing the patient with Dr. Lucky Cowboy who agreed with plan to apply Surgicel around the skin site and have the patient go to dialysis.  Wound was dressed with Surgicel ____________________________________________  FINAL CLINICAL IMPRESSION(S) / ED DIAGNOSES  Final diagnoses:  Visit for wound check     MEDICATIONS GIVEN DURING THIS VISIT:  Medications - No data to display   ED Discharge Orders    None      *Please note:  ELERY FOULKES was evaluated in Emergency Department on 11/09/2019 for the symptoms described in the history of present illness. He was evaluated in the context of the global COVID-19 pandemic, which necessitated consideration that the patient might be at risk for infection with the SARS-CoV-2 virus that causes COVID-19. Institutional protocols and algorithms that pertain to the evaluation of patients at risk for COVID-19 are in a state of rapid change based on information released by regulatory bodies including the CDC and federal and state organizations. These policies and algorithms were followed during the patient's care in the ED.  Some ED evaluations and interventions may be delayed as a result of limited staffing during the pandemic.*  Note:  This document was prepared using Dragon voice recognition software and may include unintentional dictation errors.   Gregor Hams, MD 11/14/19 [phone]

## 2019-11-09 NOTE — ED Notes (Signed)
Surgicel and 4x4 placed over site. No bleeding noted at this time.

## 2019-11-09 NOTE — ED Notes (Signed)
Pt brought from dialysis center due to bleeding from fistula catheter. States went to dialysis this am for regular treatment and was sent here to have catheter site checked. States fistula was placed by Dr. Lucky Cowboy yesterday and has been bleeding over night. Pt was on eliquis prior to procedure but was changed over to aspirin earlier this week. Pt did not have dialysis prior to being sent. Pt has no complaints at this time.

## 2019-11-09 NOTE — ED Triage Notes (Signed)
Pt brought in by EMS from dialysis center, states had dialysis catheter yesterday and has been bleeding through the night. Went to dialysis this am and they sent him to Ed prior to treatment due to bleeding. Pt has no complaints at this time.

## 2019-11-09 NOTE — ED Notes (Signed)
Attempting to contact [facility] to have pt discharged back for dialysis center.

## 2019-11-09 NOTE — Telephone Encounter (Signed)
Spoke with Daryl Weeks at [facility] and once the patient is scheduled for surgery I am to fax the pre-surgical information to her for the patient. Patient is scheduled for surgery with Dr. Delana Meyer on 11/23/19. Patient will do a phone pre-op on 11/20/19 between 8-1 pm and covid testing on 11/22/19 between 12:30-2:30 pm at the [place].

## 2019-11-11 DIAGNOSIS — N186 End stage renal disease: Secondary | ICD-10-CM | POA: Diagnosis not present

## 2019-11-11 DIAGNOSIS — Z992 Dependence on renal dialysis: Secondary | ICD-10-CM | POA: Diagnosis not present

## 2019-11-12 DIAGNOSIS — Z23 Encounter for immunization: Secondary | ICD-10-CM | POA: Diagnosis not present

## 2019-11-12 DIAGNOSIS — D631 Anemia in chronic kidney disease: Secondary | ICD-10-CM | POA: Diagnosis not present

## 2019-11-12 DIAGNOSIS — D509 Iron deficiency anemia, unspecified: Secondary | ICD-10-CM | POA: Diagnosis not present

## 2019-11-12 DIAGNOSIS — N186 End stage renal disease: Secondary | ICD-10-CM | POA: Diagnosis not present

## 2019-11-12 DIAGNOSIS — Z992 Dependence on renal dialysis: Secondary | ICD-10-CM | POA: Diagnosis not present

## 2019-11-12 DIAGNOSIS — N2581 Secondary hyperparathyroidism of renal origin: Secondary | ICD-10-CM | POA: Diagnosis not present

## 2019-11-13 ENCOUNTER — Encounter (INDEPENDENT_AMBULATORY_CARE_PROVIDER_SITE_OTHER): Payer: Self-pay

## 2019-11-13 ENCOUNTER — Telehealth (INDEPENDENT_AMBULATORY_CARE_PROVIDER_SITE_OTHER): Payer: Self-pay

## 2019-11-13 NOTE — Telephone Encounter (Signed)
The patient has been rescheduled from 11/23/19 to 11/30/19 with Dr. Delana Meyer for a left AVF. Pre-op was rescheduled to 11/27/19 with a phone call between 8-1 pm and covid test rescheduled to 11/29/19 between 12:30-2:30 pm at the [place]. This information will be faxed to Shanon Payor for the patient.

## 2019-11-14 DIAGNOSIS — Z23 Encounter for immunization: Secondary | ICD-10-CM | POA: Diagnosis not present

## 2019-11-14 DIAGNOSIS — N186 End stage renal disease: Secondary | ICD-10-CM | POA: Diagnosis not present

## 2019-11-14 DIAGNOSIS — N2581 Secondary hyperparathyroidism of renal origin: Secondary | ICD-10-CM | POA: Diagnosis not present

## 2019-11-14 DIAGNOSIS — D631 Anemia in chronic kidney disease: Secondary | ICD-10-CM | POA: Diagnosis not present

## 2019-11-14 DIAGNOSIS — Z992 Dependence on renal dialysis: Secondary | ICD-10-CM | POA: Diagnosis not present

## 2019-11-14 DIAGNOSIS — D509 Iron deficiency anemia, unspecified: Secondary | ICD-10-CM | POA: Diagnosis not present

## 2019-11-16 DIAGNOSIS — Z23 Encounter for immunization: Secondary | ICD-10-CM | POA: Diagnosis not present

## 2019-11-16 DIAGNOSIS — D631 Anemia in chronic kidney disease: Secondary | ICD-10-CM | POA: Diagnosis not present

## 2019-11-16 DIAGNOSIS — D509 Iron deficiency anemia, unspecified: Secondary | ICD-10-CM | POA: Diagnosis not present

## 2019-11-16 DIAGNOSIS — N186 End stage renal disease: Secondary | ICD-10-CM | POA: Diagnosis not present

## 2019-11-16 DIAGNOSIS — N2581 Secondary hyperparathyroidism of renal origin: Secondary | ICD-10-CM | POA: Diagnosis not present

## 2019-11-16 DIAGNOSIS — Z992 Dependence on renal dialysis: Secondary | ICD-10-CM | POA: Diagnosis not present

## 2019-11-19 DIAGNOSIS — Z992 Dependence on renal dialysis: Secondary | ICD-10-CM | POA: Diagnosis not present

## 2019-11-19 DIAGNOSIS — D509 Iron deficiency anemia, unspecified: Secondary | ICD-10-CM | POA: Diagnosis not present

## 2019-11-19 DIAGNOSIS — N186 End stage renal disease: Secondary | ICD-10-CM | POA: Diagnosis not present

## 2019-11-19 DIAGNOSIS — Z23 Encounter for immunization: Secondary | ICD-10-CM | POA: Diagnosis not present

## 2019-11-19 DIAGNOSIS — D631 Anemia in chronic kidney disease: Secondary | ICD-10-CM | POA: Diagnosis not present

## 2019-11-19 DIAGNOSIS — N2581 Secondary hyperparathyroidism of renal origin: Secondary | ICD-10-CM | POA: Diagnosis not present

## 2019-11-20 ENCOUNTER — Other Ambulatory Visit: Payer: Medicare Other

## 2019-11-21 DIAGNOSIS — D509 Iron deficiency anemia, unspecified: Secondary | ICD-10-CM | POA: Diagnosis not present

## 2019-11-21 DIAGNOSIS — Z23 Encounter for immunization: Secondary | ICD-10-CM | POA: Diagnosis not present

## 2019-11-21 DIAGNOSIS — D631 Anemia in chronic kidney disease: Secondary | ICD-10-CM | POA: Diagnosis not present

## 2019-11-21 DIAGNOSIS — N186 End stage renal disease: Secondary | ICD-10-CM | POA: Diagnosis not present

## 2019-11-21 DIAGNOSIS — N2581 Secondary hyperparathyroidism of renal origin: Secondary | ICD-10-CM | POA: Diagnosis not present

## 2019-11-21 DIAGNOSIS — Z992 Dependence on renal dialysis: Secondary | ICD-10-CM | POA: Diagnosis not present

## 2019-11-22 ENCOUNTER — Other Ambulatory Visit: Payer: Medicare Other

## 2019-11-22 DIAGNOSIS — N186 End stage renal disease: Secondary | ICD-10-CM | POA: Diagnosis not present

## 2019-11-22 DIAGNOSIS — D631 Anemia in chronic kidney disease: Secondary | ICD-10-CM | POA: Diagnosis not present

## 2019-11-22 DIAGNOSIS — I5032 Chronic diastolic (congestive) heart failure: Secondary | ICD-10-CM | POA: Diagnosis not present

## 2019-11-22 DIAGNOSIS — I132 Hypertensive heart and chronic kidney disease with heart failure and with stage 5 chronic kidney disease, or end stage renal disease: Secondary | ICD-10-CM | POA: Diagnosis not present

## 2019-11-22 DIAGNOSIS — J181 Lobar pneumonia, unspecified organism: Secondary | ICD-10-CM | POA: Diagnosis not present

## 2019-11-22 DIAGNOSIS — E1122 Type 2 diabetes mellitus with diabetic chronic kidney disease: Secondary | ICD-10-CM | POA: Diagnosis not present

## 2019-11-23 DIAGNOSIS — Z992 Dependence on renal dialysis: Secondary | ICD-10-CM | POA: Diagnosis not present

## 2019-11-23 DIAGNOSIS — N2581 Secondary hyperparathyroidism of renal origin: Secondary | ICD-10-CM | POA: Diagnosis not present

## 2019-11-23 DIAGNOSIS — D509 Iron deficiency anemia, unspecified: Secondary | ICD-10-CM | POA: Diagnosis not present

## 2019-11-23 DIAGNOSIS — N186 End stage renal disease: Secondary | ICD-10-CM | POA: Diagnosis not present

## 2019-11-23 DIAGNOSIS — Z23 Encounter for immunization: Secondary | ICD-10-CM | POA: Diagnosis not present

## 2019-11-23 DIAGNOSIS — D631 Anemia in chronic kidney disease: Secondary | ICD-10-CM | POA: Diagnosis not present

## 2019-11-26 ENCOUNTER — Other Ambulatory Visit (INDEPENDENT_AMBULATORY_CARE_PROVIDER_SITE_OTHER): Payer: Self-pay | Admitting: Nurse Practitioner

## 2019-11-26 DIAGNOSIS — N2581 Secondary hyperparathyroidism of renal origin: Secondary | ICD-10-CM | POA: Diagnosis not present

## 2019-11-26 DIAGNOSIS — D509 Iron deficiency anemia, unspecified: Secondary | ICD-10-CM | POA: Diagnosis not present

## 2019-11-26 DIAGNOSIS — N186 End stage renal disease: Secondary | ICD-10-CM | POA: Diagnosis not present

## 2019-11-26 DIAGNOSIS — Z23 Encounter for immunization: Secondary | ICD-10-CM | POA: Diagnosis not present

## 2019-11-26 DIAGNOSIS — D631 Anemia in chronic kidney disease: Secondary | ICD-10-CM | POA: Diagnosis not present

## 2019-11-26 DIAGNOSIS — Z992 Dependence on renal dialysis: Secondary | ICD-10-CM | POA: Diagnosis not present

## 2019-11-27 ENCOUNTER — Other Ambulatory Visit: Payer: Self-pay

## 2019-11-27 ENCOUNTER — Encounter: Payer: Self-pay | Admitting: Family Medicine

## 2019-11-27 ENCOUNTER — Encounter
Admission: RE | Admit: 2019-11-27 | Discharge: 2019-11-27 | Disposition: A | Discharge status: Home / Self Care | Payer: Medicare Other | Source: Ambulatory Visit | Attending: Vascular Surgery | Admitting: Vascular Surgery

## 2019-11-27 ENCOUNTER — Ambulatory Visit (INDEPENDENT_AMBULATORY_CARE_PROVIDER_SITE_OTHER): Payer: Medicare Other | Admitting: Family Medicine

## 2019-11-27 VITALS — BP 134/70 | HR 57 | Temp 97.4°F | Resp 16 | Ht 67.0 in | Wt 154.0 lb

## 2019-11-27 DIAGNOSIS — R2231 Localized swelling, mass and lump, right upper limb: Secondary | ICD-10-CM | POA: Diagnosis not present

## 2019-11-27 DIAGNOSIS — Z01818 Encounter for other preprocedural examination: Secondary | ICD-10-CM | POA: Insufficient documentation

## 2019-11-27 NOTE — Pre-Procedure Instructions (Signed)
Daryl Mu, PA-C  Physician Assistant Certified  Cardiology     Progress Notes     Signed     Encounter Date:  11/06/2019                  Signed          Expand AllCollapse All            Expand widget buttonCollapse widget button    Show:Clear all   ManualTemplateCopied  Added by:     Daryl Mu, PA-C   Hover for detailscustomization button                                                                                                                                                                                          untitled image      Cardiology Office Note        Date:  11/06/2019      ID:  [name], DOB May 16, 1961, MRN [id number]     PCP:  Daryl Hauser, DO             Cardiologist:  Daryl Sacramento, MD   Electrophysiologist:  None      Chief Complaint: Follow-up      History of Present Illness:       Daryl Weeks is a 59 y.o. male with history of CAD s/p 4-vessel CABG in 2015 after presenting with a NSTEMI in the setting of severe anemia with GI bleed, lone A. fib diagnosed in 05/2019, ESRD on HD 4 days/week (MWFS), HFpEF, pulmonary hypertension, prior CVAs, HTN, HLD, hemoptysis, hematochezia, anemia, COPD with prior tobacco use, alcohol use, sleep apnea, anxiety, arthritis, and GERD who presents for follow-up of the above.     He suffered a NSTEMI in 2015, with LHC revealing severe multivessel CAD. Echo at that time showed normal LVSF. He subsequently underwent 4-vessel CABG in 01/2014. Repeat ischemic evaluation in 2016, as part of his kidney transplant evaluation (no longer on the list secondary to tobacco and alcohol use), was nonischemic. He was lost to follow up from 06/2017 until he was admitted to the  hospital in 05/2019 with noted unsteadiness leading to two falls, dyspnea, chest discomfort, and possible slurred speech. He was treated for PNA. CT head was not acute. MRI brain showed no acute infarct with chronic lacunar infarcts within the left frontal lobe white matter and left basal ganglia. CXR concerning for PNA vs edema. While undergoing HD, he developed new onset Afib with RVR. HS-Tn 29-->33. Echo showed an EF of 50-55%, DD, no RWMA, moderately reduced RVSF with mildly enlarged RV  cavity size, PASP 56.7 mmHg, severely dilated left atrium, mild MR. Lexiscan Myoview showed no significant ischemia with small mild fixed apical perfusion defect, unable to exclude attenuation artifact vs small prior MI, and was overall a low risk scan. Carotid artery ultrasound showed no hemodynamically significant stenosis bilaterally with an incidentally noted nonocclusive thrombus within the right IJ vein (recent HD catheter within the right IJ vein) and antegrade flow of the bilateral vertebral arteries. He was discharged on Coreg, amiodarone, and Eliquis. He has subsequently undergone ligation of his dialysis site in 06/2019 secondary to bleeding issues. EKG showed sinus bradycardia with 1st degree AV block. He was readmitted in 09/2019 with hemoptysis and hyperkalemia. CTA chest showed no evidence of PE with atelectasis vs PNA with hemoptysis felt to be secondary from coughing. HS-TN 42-->37. EKG showed sinus rhythm. His Eliquis was briefly held.      He was seen by Daryl Mood, PA-C in follow up on 10/09/2019. At that time, there was noted confusion regarding the patient's Eliquis dosing. Note indicates his Eliquis had been reduced to 2.5 mg bid by nephrology and had been maintained on that dose. It appeared he was taking Eliquis 2.5 once daily at the time of his visit in the setting of hematochezia, which he attributed to hemorrhoids, as well as his history of hemoptysis. He was continued on this dose and frequency  at that visit. ASA was stopped. There was concern for the patient being jaundiced. He was noted to be in sinus rhythm, though did continue to note palpitations. In this setting, he underwent Zio patch which preliminary report showed the predominant rhythm of sinus with an average heart rate of 56 bpm with a range of 50 to 97 bpm.  First-degree AV block was noted.  2 atrial runs occurred with the fastest being 4 beats with a maximum rate of 97 bpm and the longest lasting 5 beats with an average rate of 92 bpm.  Isolated PACs, atrial couplets, PVCs, and ventricular couplets were rare.  Patient triggered events corresponded to sinus rhythm along with short atrial runs. Amiodarone was decreased to 200 mg daily. Labs checked at that time were as outlined below. He was continued on once daily, reduced dose Eliquis.      He comes in today indicating he is doing reasonably well.  He denies any chest pain, dyspnea, palpitations, dizziness, presyncope, syncope, or falls.  He continues to undergo dialysis 4 days/week without adverse event.  He is scheduled for dialysis permacath insertion later this week.  He denies any further hemoptysis or hematochezia.  No melena.  He remains on Eliquis 2.5 mg once daily dosing.  He indicates he primarily does his medications himself though is not certain what he is taking.  He currently does not have a home health aide coming out.  No lower extremity swelling, abdominal distention, orthopnea, PND, early satiety.        Labs independently reviewed:  09/2019 - potassium 5.5-->4.3, albumin 3.7, AST/ALT normal, T bili 0.2, WBC 12.0, HGB 12.0, PLT 330, TSH normal  07/2019 - magnesium 2.5  05/2019 - TC 137, TG 55, HDL 72, LDL 54          Past Medical History:    Diagnosis   Date    .   (HFpEF) heart failure with preserved ejection fraction ([place])            a. 05/2019 Echo: EF 50-55%, diast dysfxn, RVSP 56.74mmHg, Sev dil LA. Mildly dil PA.    [name]  Anemia         .   Anxiety        .   Arthritis        .   Colon polyps        .   COPD (chronic obstructive pulmonary disease) ([place])        .   Coronary artery disease            a. 10/2013 NSTEMI/Cath: Severe 3VD-->CABG x 4 @ [facility] 01/2014 (LIMA->LAD, VG->OM1->OM2, VG->RCA); b. 11/2014 MV: No isch/infarct; c. 05/2019 MV: EF 40% (50-55 by echo), small,mild, fixed apical defect - ? atten vs infarct. No ischemia->low risk.    .   ESRD (end stage renal disease) ([place])            a. MWFSat HD    .   ETOH abuse            a. 2 beers/night.    [name]   GERD (gastroesophageal reflux disease)        .   History of pneumonia        .   Hyperlipidemia        .   Hypertension        .   IgA nephropathy        .   Lower GI bleed            a. Due to colon polyps. Status post resection of 14 polyps    .   Non-ST elevation MI (NSTEMI) ([place])        .   PAF (paroxysmal atrial fibrillation) ([place])            a. Dx 05/2019. CHA2DS2VASc = 3-->Eliquis/Amio.    [name]   Rheumatoid arthritis ([place])        .   Shortness of breath        .   Sleep apnea        .   Tobacco abuse                    Past Surgical History:    Procedure   Laterality   Date    .   A/V FISTULAGRAM   Left   10/28/2016        Procedure: A/V Fistulagram;  Surgeon: [name], MD;  Location: [place] [facility];  Service: Cardiovascular;  Laterality: Left;    .   A/V FISTULAGRAM   Left   12/07/2018        Procedure: A/V FISTULAGRAM;  Surgeon: [name], MD;  Location: [facility];  Service: Cardiovascular;  Laterality: Left;    .   A/V FISTULAGRAM   N/A   06/04/2019        Procedure: A/V Fistulagram- LEFT;  Surgeon: [name], MD;  Location: [place] [facility];  Service: Cardiovascular;  Laterality: N/A;    .   A/V SHUNT INTERVENTION   N/A   10/28/2016         Procedure: A/V Shunt Intervention;  Surgeon: [name], MD;  Location: [facility] [facility];  Service: Cardiovascular;  Laterality: N/A;    .   A/V SHUNTOGRAM   Left   02/01/2019        Procedure: A/V SHUNTOGRAM;  Surgeon: [name], MD;  Location: [place] [facility];  Service: Cardiovascular;  Laterality: Left;    .   AV FISTULA PLACEMENT            .  CARDIAC CATHETERIZATION                RCA 90% and calcified mid LAD 80% Stenosis    .   CORONARY ARTERY BYPASS GRAFT   N/A   11/29/2013        Procedure: CORONARY ARTERY BYPASS GRAFTING (CABG) x 4 using endoscopically harvested right saphenous vein and left internal mammary artery;  Surgeon: [name], MD;  Location: [facility];  Service: Open Heart Surgery;  Laterality: N/A;    .   dialysis catheterr       2/15    .   DIALYSIS/PERMA CATHETER INSERTION   N/A   07/16/2019        Procedure: DIALYSIS/PERMA CATHETER INSERTION WITH VAC CHANGE UNDER SEDATION;  Surgeon: [name], MD;  Location: [facility] [facility];  Service: Cardiovascular;  Laterality: N/A;    .   DIALYSIS/PERMA CATHETER REMOVAL   N/A   02/15/2019        Procedure: DIALYSIS/PERMA CATHETER REMOVAL;  Surgeon: [name], MD;  Location: [place] [facility];  Service: Cardiovascular;  Laterality: N/A;    .   INSERTION OF DIALYSIS CATHETER   N/A   12/14/2018        Procedure: INSERTION OF DIALYSIS CATHETER ( [place] );  Surgeon: [name], MD;  Location: [facility];  Service: Vascular;  Laterality: N/A;    .   INTRAOPERATIVE TRANSESOPHAGEAL ECHOCARDIOGRAM   N/A   11/29/2013        Procedure: INTRAOPERATIVE TRANSESOPHAGEAL ECHOCARDIOGRAM;  Surgeon: [name], MD;  Location: [facility];  Service: Open Heart Surgery;  Laterality: N/A;    .   LIGATION OF ARTERIOVENOUS  FISTULA   Left   07/13/2019        Procedure: LIGATION OF ARTERIOVENOUS  FISTULA;  Surgeon: [name], MD;  Location: [facility];  Service: Vascular;  Laterality: Left;    .   PERIPHERAL VASCULAR CATHETERIZATION   N/A   06/12/2015        Procedure: A/V Shuntogram/Fistulagram;  Surgeon: [name], MD;  Location: [facility];  Service: Cardiovascular;  Laterality: N/A;    .   PERIPHERAL VASCULAR CATHETERIZATION   Left   06/12/2015        Procedure: A/V Shunt Intervention;  Surgeon: [name], MD;  Location: [facility];  Service: Cardiovascular;  Laterality: Left;    .   RENAL BIOPSY   Left   14    .   REVISON OF ARTERIOVENOUS FISTULA   Left   12/14/2018        Procedure: REVISON OF ARTERIOVENOUS FISTULA;  Surgeon: [name], MD;  Location: [facility];  Service: Vascular;  Laterality: Left;    .   UPPER EXTREMITY VENOGRAPHY   Bilateral   09/18/2019        Procedure: UPPER EXTREMITY VENOGRAPHY;  Surgeon: Katha Cabal, MD;  Location: [facility];  Service: Cardiovascular;  Laterality: Bilateral;          Current Medications:  Active Medications                                      Allergies:   Lisinopril       Social History             Socioeconomic History    .  Marital status:   Single            Spouse name:   Not on file    .   Number of children:   Not on file    .   Years of education:   Not on file    .   Highest education level:   8th grade    Occupational History    .   Not on file    Tobacco Use    .   Smoking status:   Current Every Day Smoker            Packs/day:   0.50            Years:   32.00            Pack years:   16.00            Types:   Cigarettes    .   Smokeless tobacco:   Never Used    .   Tobacco comment: daily    Substance and Sexual Activity    .   Alcohol use:   Yes            Alcohol/week:   7.0 standard drinks             Types:   7 Cans of beer per week    .   Drug use:   No    .   Sexual activity:   Not on file    Other Topics   Concern    .   Not on file    Social History Narrative        Lives locally by himself.  Does not routinely exercise.        Social Determinants of Health           Financial Resource Strain:     .   Difficulty of Paying Living Expenses: Not on file    Food Insecurity:     .   Worried About Charity fundraiser in the Last Year: Not on file    .   Ran Out of Food in the Last Year: Not on file    Transportation Needs:     .   Lack of Transportation (Medical): Not on file    .   Lack of Transportation (Non-Medical): Not on file    Physical Activity:     .   Days of Exercise per Week: Not on file    .   Minutes of Exercise per Session: Not on file    Stress:     .   Feeling of Stress : Not on file    Social Connections:     .   Frequency of Communication with Friends and Family: Not on file    .   Frequency of Social Gatherings with Friends and Family: Not on file    .   Attends Religious Services: Not on file    .   Active Member of Clubs or Organizations: Not on file    .   Attends Archivist Meetings: Not on file    .   Marital Status: Not on file          Family History:   The patient's family history includes Healthy in his sister; Heart disease in his brother and father. There is no history of Stroke.     ROS:    Review of Systems  Constitutional: Positive for malaise/fatigue. Negative for chills, diaphoresis, fever and weight loss.   HENT: Negative for congestion.    Eyes: Negative for discharge and redness.   Respiratory: Negative for cough, hemoptysis, sputum production, shortness of breath and wheezing.    Cardiovascular: Negative for chest pain, palpitations, orthopnea, claudication, leg swelling and PND.    Gastrointestinal: Positive for constipation. Negative for abdominal pain, blood in stool, diarrhea, heartburn, melena, nausea and vomiting.   Genitourinary: Negative for hematuria.   Musculoskeletal: Negative for falls and myalgias.   Skin: Negative for rash.   Neurological: Positive for weakness. Negative for dizziness, tingling, tremors, sensory change, speech change, focal weakness and loss of consciousness.   Endo/Heme/Allergies: Does not bruise/bleed easily.   Psychiatric/Behavioral: Negative for substance abuse. The patient is not nervous/anxious.    All other systems reviewed and are negative.            EKGs/Labs/Other Studies Reviewed:        Studies reviewed were summarized above. The additional studies were reviewed today:     Zio patch 09/2019:  Predominant rhythm of sinus with an average heart rate of 56 bpm with a range of 50 to 97 bpm.  First-degree AV block was noted.  2 atrial runs occurred with the fastest being 4 beats with a maximum rate of 97 bpm and the longest lasting 5 beats with an average rate of 92 bpm.  Isolated PACs, atrial couplets, PVCs, and ventricular couplets were rare.  Patient triggered events corresponded to sinus rhythm along with short atrial runs.  __________     2D echo 05/2019:  1. The left ventricle has low normal systolic function, with an ejection  fraction of 50-55%. The cavity size was normal. There is moderately  increased left ventricular wall thickness. Left ventricular diastolic  Doppler parameters are consistent with  pseudonormalization. Elevated mean left atrial pressure No evidence of  left ventricular regional wall motion abnormalities.   2. The right ventricle has moderately reduced systolic function. The  cavity was mildly enlarged. There is no increase in right ventricular wall  thickness. Right ventricular systolic pressure is moderately elevated with  an estimated pressure of 56.7  mmHg.   3. Left  atrial size was severely dilated.   4. The aortic valve is tricuspid.   5. The aorta is normal unless otherwise noted.   6. Mildly dilated pulmonary artery.   7. The inferior vena cava was dilated in size with <50% respiratory  variability.   8. The interatrial septum was not well visualized.  __________     Carlton Adam MPI 05/2019:  Pharmacological myocardial perfusion imaging study with no significant ischemia  Small mild fixed apical perfusion defect, unable to exclude attenuation artifact vs small prior MI.  Normal wall motion, EF estimated at 40% (decreased EF secondary to GI uptake artifact)  No EKG changes concerning for ischemia at peak stress or in recovery.  Baseline EKG with ST abn in V5 and V6, more pronounced in V4 to V6 with lexiscan infusion  Low risk scan        EKG:  EKG is ordered today.  The EKG ordered today demonstrates sinus bradycardia, 57 bpm, first-degree AV block, left atrial enlargement, LVH with nonspecific IVCD, QTc 502 ms     Recent Labs:  05/28/2019: B Natriuretic Peptide >4,500.0  07/17/2019: Magnesium 2.5  10/09/2019: ALT 11; BUN 40; Creatinine, Ser 8.53; Hemoglobin 12.0; Platelets 330; Sodium 132; TSH 1.230  10/10/2019: Potassium 4.3   Recent Lipid  Panel  Labs (Brief)                                                                                                                                                                                             PHYSICAL EXAM:        VS:  BP 118/72 (BP Location: Left Arm, Patient Position: Sitting, Cuff Size: Normal)   Pulse (!) 57   Ht 5\' 7"  (1.702 m)   Wt 150 lb 8 oz (68.3 kg)   SpO2 98%   BMI 23.57 kg/m   BMI: Body mass index is 23.57 kg/m.     Physical Exam   Constitutional: He is oriented to person, place, and time. He appears well-developed and  well-nourished.   HENT:   Head: Normocephalic and atraumatic.   Eyes: Right eye exhibits no discharge. Left eye exhibits no discharge.   Neck: No JVD present.   Cardiovascular: Normal rate, regular rhythm, S1 normal, S2 normal and normal heart sounds. Exam reveals no distant heart sounds, no friction rub, no midsystolic click and no opening snap.   No murmur heard.  Pulses:       Posterior tibial pulses are 1+ on the right side and 1+ on the left side.   Pulmonary/Chest: Effort normal and breath sounds normal. No respiratory distress. He has no decreased breath sounds. He has no wheezes. He has no rales. He exhibits no tenderness.   Abdominal: Soft. He exhibits no distension. There is no abdominal tenderness.   Musculoskeletal:         General: No edema.     Cervical back: Normal range of motion.  Neurological: He is alert and oriented to person, place, and time.   Skin: Skin is warm and dry. No cyanosis. Nails show no clubbing.  Psychiatric: He has a normal Weeks and affect. His speech is normal and behavior is normal. Judgment and thought content normal.             Wt Readings from Last 3 Encounters:    11/06/19   150 lb 8 oz (68.3 kg)    10/11/19   153 lb 12.8 oz (69.8 kg)    10/09/19   150 lb 12 oz (68.4 kg)           ASSESSMENT & PLAN:       1.CAD status post CABG without angina: He is doing well without any symptoms concerning for angina.  Recent Lexiscan MPI without significant ischemia and low risk as outlined above.  Restart aspirin 81 mg daily with discontinuation of Eliquis as outlined below.  He will otherwise continue amlodipine,  atorvastatin, and carvedilol.  No plan for further ischemic evaluation at this time.      2.Lone A. Fib: Patient was noted to have brief episode of A. fib during acute illness as outlined above.  Subsequent outpatient cardiac monitoring has shown no recurrence of significant A. fib.  Given this, and in the  setting of history of hematochezia and hemoptysis as well as with the patient being high bleeding risk secondary to dialysis, after discussion with his primary cardiologist, we have agreed to discontinue Eliquis.  Also, given this was an isolated episode of A. fib in the setting of acute illness, and with slightly prolonged QTc we have discontinued amiodarone.  With his sinus bradycardia and first-degree AV block we will decrease carvedilol to 12.5 mg twice daily.        3.HFpEF/pulmonary hypertension: Volume is managed by hemodialysis 4 days/week with some daily urine output.  His pulmonary pretension is likely in the setting of underlying lung disease with COPD secondary to tobacco use.  Remains on Lasix 80 mg twice daily.      4.ESRD: Compliant with dialysis 4 days/week.  Followed by nephrology.      5.Anemia/hematochezia/hemoptysis: Patient indicates symptoms have resolved.  Most recent CBC showed stable hemoglobin of 12.      6.HTN: Blood pressure is well controlled today.  Continue reduced dose carvedilol as outlined above along with amlodipine, hydralazine, clonidine and Lasix.      7.HLD: LDL of 54 from 05/2019, with normal liver function from 09/2019.  Continue atorvastatin 20 mg daily.      8.Polysubstance use: Complete cessation of alcohol and tobacco is advised.  He has previously been taken off the kidney [facility] list at [facility] due to ongoing substance use.      9.Preoperative cardiac evaluation: Patient is scheduled for dialysis permacath later this week.  We have discontinued Eliquis as outlined above and started the patient back on aspirin given his history of CAD.  Recent Lexiscan Myoview showed no significant ischemia and was overall a low risk.  Echo showed low normal LV systolic function as outlined above.  He does not have any complaints of anginal symptoms at this time.  At baseline, his functional status is quite limited secondary to his comorbid conditions.   That said, he does indicate to me he is able to achieve 4 METs.  Per Revised Cardiac Index, he is high risk for noncardiac procedure with an estimated rate of greater than 11% for MI, pulmonary edema, VF, cardiac arrest, or complete heart block.  No further cardiac testing will mitigate this risk at this time, and he may proceed at an overall high risk.      10.Medication management: Patient does admit to some confusion regarding his medications.  We will reach out to his pharmacy and see if they can supply him with blister packs of medication.  If not, would recommend he get established with home health to assist with his daily medications and other ADLs as indicated.               Disposition: F/u with Dr. Fletcher Anon or an APP in 2 months.        Medication Adjustments/Labs and Tests Ordered:  Current medicines are reviewed at length with the patient today.  Concerns regarding medicines are outlined above. Medication changes, Labs and Tests ordered today are summarized above and listed in the Patient Instructions accessible in Encounters.      Signed,  Christell Faith, PA-C  11/06/2019  11:11 AM      [facility]  [address]  [place], [place] [address]  [phone]          Electronically signed by Daryl Mu, PA-C at 11/06/2019 12:29 PM             Office Visit on 11/06/2019               Detailed Report        Dunn, Areta Haber, PA-C  Physician Assistant Certified  Cardiology     Progress Notes     Signed     Encounter Date:  11/06/2019                  Signed          Expand AllCollapse All            Expand widget buttonCollapse widget button    Show:Clear all   ManualTemplateCopied  Added by:     Daryl Mu, PA-C   Hover for detailscustomization  button                                                                                                                                                                                          untitled image      Cardiology Office Note        Date:  11/06/2019      ID:  CORDAI RODRIGUE, DOB 06-09-1961, MRN [id number]     PCP:  Daryl Hauser, DO             Cardiologist:  Daryl Sacramento, MD   Electrophysiologist:  None      Chief Complaint: Follow-up      History of Present Illness:       Daryl Weeks is a 59 y.o. male with history of CAD s/p 4-vessel CABG in 2015 after presenting with a NSTEMI in the setting of severe anemia with GI bleed, lone A. fib diagnosed in 05/2019, ESRD on HD 4 days/week (MWFS), HFpEF, pulmonary hypertension, prior CVAs, HTN, HLD, hemoptysis, hematochezia, anemia, COPD with prior tobacco use, alcohol use, sleep apnea, anxiety, arthritis, and GERD who presents for follow-up of the above.     He suffered a NSTEMI in 2015, with LHC revealing severe multivessel CAD. Echo at that time showed normal LVSF. He subsequently underwent 4-vessel CABG in 01/2014. Repeat ischemic evaluation in 2016, as part of his kidney transplant evaluation (no longer on the list secondary to tobacco and alcohol use), was nonischemic. He was lost to follow up from 06/2017 until he was admitted to  the hospital in 05/2019 with noted unsteadiness leading to two falls, dyspnea, chest discomfort, and possible slurred speech. He was treated for PNA. CT head was not acute. MRI brain showed no acute infarct with chronic lacunar infarcts within the left frontal lobe white matter and left basal ganglia. CXR concerning for PNA vs edema. While undergoing HD, he developed new onset Afib with RVR. HS-Tn  29-->33. Echo showed an EF of 50-55%, DD, no RWMA, moderately reduced RVSF with mildly enlarged RV cavity size, PASP 56.7 mmHg, severely dilated left atrium, mild MR. Lexiscan Myoview showed no significant ischemia with small mild fixed apical perfusion defect, unable to exclude attenuation artifact vs small prior MI, and was overall a low risk scan. Carotid artery ultrasound showed no hemodynamically significant stenosis bilaterally with an incidentally noted nonocclusive thrombus within the right IJ vein (recent HD catheter within the right IJ vein) and antegrade flow of the bilateral vertebral arteries. He was discharged on Coreg, amiodarone, and Eliquis. He has subsequently undergone ligation of his dialysis site in 06/2019 secondary to bleeding issues. EKG showed sinus bradycardia with 1st degree AV block. He was readmitted in 09/2019 with hemoptysis and hyperkalemia. CTA chest showed no evidence of PE with atelectasis vs PNA with hemoptysis felt to be secondary from coughing. HS-TN 42-->37. EKG showed sinus rhythm. His Eliquis was briefly held.      He was seen by Daryl Mood, PA-C in follow up on 10/09/2019. At that time, there was noted confusion regarding the patient's Eliquis dosing. Note indicates his Eliquis had been reduced to 2.5 mg bid by nephrology and had been maintained on that dose. It appeared he was taking Eliquis 2.5 once daily at the time of his visit in the setting of hematochezia, which he attributed to hemorrhoids, as well as his history of hemoptysis. He was continued on this dose and frequency at that visit. ASA was stopped. There was concern for the patient being jaundiced. He was noted to be in sinus rhythm, though did continue to note palpitations. In this setting, he underwent Zio patch which preliminary report showed the predominant rhythm of sinus with an average heart rate of 56 bpm with a range of 50 to 97 bpm.  First-degree AV block was noted.  2 atrial runs occurred with  the fastest being 4 beats with a maximum rate of 97 bpm and the longest lasting 5 beats with an average rate of 92 bpm.  Isolated PACs, atrial couplets, PVCs, and ventricular couplets were rare.  Patient triggered events corresponded to sinus rhythm along with short atrial runs. Amiodarone was decreased to 200 mg daily. Labs checked at that time were as outlined below. He was continued on once daily, reduced dose Eliquis.      He comes in today indicating he is doing reasonably well.  He denies any chest pain, dyspnea, palpitations, dizziness, presyncope, syncope, or falls.  He continues to undergo dialysis 4 days/week without adverse event.  He is scheduled for dialysis permacath insertion later this week.  He denies any further hemoptysis or hematochezia.  No melena.  He remains on Eliquis 2.5 mg once daily dosing.  He indicates he primarily does his medications himself though is not certain what he is taking.  He currently does not have a home health aide coming out.  No lower extremity swelling, abdominal distention, orthopnea, PND, early satiety.        Labs independently reviewed:  09/2019 - potassium 5.5-->4.3, albumin 3.7, AST/ALT normal, T bili 0.2,  WBC 12.0, HGB 12.0, PLT 330, TSH normal  07/2019 - magnesium 2.5  05/2019 - TC 137, TG 55, HDL 72, LDL 54          Past Medical History:    Diagnosis   Date    .   (HFpEF) heart failure with preserved ejection fraction ([place])            a. 05/2019 Echo: EF 50-55%, diast dysfxn, RVSP 56.64mmHg, Sev dil LA. Mildly dil PA.    [name]   Anemia        .   Anxiety        .   Arthritis        .   Colon polyps        .   COPD (chronic obstructive pulmonary disease) ([place])        .   Coronary artery disease            a. 10/2013 NSTEMI/Cath: Severe 3VD-->CABG x 4 @ [facility] 01/2014 (LIMA->LAD, VG->OM1->OM2, VG->RCA); b. 11/2014 MV: No isch/infarct; c. 05/2019 MV: EF 40% (50-55 by echo), small,mild, fixed  apical defect - ? atten vs infarct. No ischemia->low risk.    .   ESRD (end stage renal disease) ([place])            a. MWFSat HD    .   ETOH abuse            a. 2 beers/night.    [name]   GERD (gastroesophageal reflux disease)        .   History of pneumonia        .   Hyperlipidemia        .   Hypertension        .   IgA nephropathy        .   Lower GI bleed            a. Due to colon polyps. Status post resection of 14 polyps    .   Non-ST elevation MI (NSTEMI) ([place])        .   PAF (paroxysmal atrial fibrillation) ([place])            a. Dx 05/2019. CHA2DS2VASc = 3-->Eliquis/Amio.    [name]   Rheumatoid arthritis ([place])        .   Shortness of breath        .   Sleep apnea        .   Tobacco abuse                    Past Surgical History:    Procedure   Laterality   Date    .   A/V FISTULAGRAM   Left   10/28/2016        Procedure: A/V Fistulagram;  Surgeon: [name], MD;  Location: [place] [facility];  Service: Cardiovascular;  Laterality: Left;    .   A/V FISTULAGRAM   Left   12/07/2018        Procedure: A/V FISTULAGRAM;  Surgeon: [name], MD;  Location: [place] [facility];  Service: Cardiovascular;  Laterality: Left;    .   A/V FISTULAGRAM   N/A   06/04/2019        Procedure: A/V Fistulagram- LEFT;  Surgeon: [name], MD;  Location: [place] [facility];  Service: Cardiovascular;  Laterality: N/A;    .   A/V SHUNT INTERVENTION  N/A   10/28/2016        Procedure: A/V Shunt Intervention;  Surgeon: [name], MD;  Location: [place] [facility];  Service: Cardiovascular;  Laterality: N/A;    .   A/V SHUNTOGRAM   Left   02/01/2019        Procedure: A/V SHUNTOGRAM;  Surgeon: [name], MD;  Location: [place] [facility];  Service: Cardiovascular;  Laterality: Left;    .   AV FISTULA PLACEMENT            .    CARDIAC CATHETERIZATION                RCA 90% and calcified mid LAD 80% Stenosis    .   CORONARY ARTERY BYPASS GRAFT   N/A   11/29/2013        Procedure: CORONARY ARTERY BYPASS GRAFTING (CABG) x 4 using endoscopically harvested right saphenous vein and left internal mammary artery;  Surgeon: [name], MD;  Location: [place] [facility];  Service: Open Heart Surgery;  Laterality: N/A;    .   dialysis catheterr       2/15    .   DIALYSIS/PERMA CATHETER INSERTION   N/A   07/16/2019        Procedure: DIALYSIS/PERMA CATHETER INSERTION WITH VAC CHANGE UNDER SEDATION;  Surgeon: [name], MD;  Location: [place] [facility];  Service: Cardiovascular;  Laterality: N/A;    .   DIALYSIS/PERMA CATHETER REMOVAL   N/A   02/15/2019        Procedure: DIALYSIS/PERMA CATHETER REMOVAL;  Surgeon: [name], MD;  Location: [place] [facility];  Service: Cardiovascular;  Laterality: N/A;    .   INSERTION OF DIALYSIS CATHETER   N/A   12/14/2018        Procedure: INSERTION OF DIALYSIS CATHETER ( [place] );  Surgeon: [name], MD;  Location: [facility];  Service: Vascular;  Laterality: N/A;    .   INTRAOPERATIVE TRANSESOPHAGEAL ECHOCARDIOGRAM   N/A   11/29/2013        Procedure: INTRAOPERATIVE TRANSESOPHAGEAL ECHOCARDIOGRAM;  Surgeon: [name], MD;  Location: [place] [facility];  Service: Open Heart Surgery;  Laterality: N/A;    .   LIGATION OF ARTERIOVENOUS  FISTULA   Left   07/13/2019        Procedure: LIGATION OF ARTERIOVENOUS  FISTULA;  Surgeon: [name], MD;  Location: [facility];  Service: Vascular;  Laterality: Left;    .   PERIPHERAL VASCULAR CATHETERIZATION   N/A   06/12/2015        Procedure: A/V Shuntogram/Fistulagram;  Surgeon: [name], MD;  Location: [place] [facility];  Service: Cardiovascular;  Laterality: N/A;    .   PERIPHERAL VASCULAR CATHETERIZATION   Left   06/12/2015        Procedure: A/V  Shunt Intervention;  Surgeon: [name], MD;  Location: [place] [facility];  Service: Cardiovascular;  Laterality: Left;    .   RENAL BIOPSY   Left   14    .   REVISON OF ARTERIOVENOUS FISTULA   Left   12/14/2018        Procedure: REVISON OF ARTERIOVENOUS FISTULA;  Surgeon: [name], MD;  Location: [facility];  Service: Vascular;  Laterality: Left;    .   UPPER EXTREMITY VENOGRAPHY   Bilateral   09/18/2019        Procedure: UPPER EXTREMITY  VENOGRAPHY;  Surgeon: Katha Cabal, MD;  Location: [place] [facility];  Service: Cardiovascular;  Laterality: Bilateral;          Current Medications:  Active Medications                                      Allergies:   Lisinopril       Social History             Socioeconomic History    .   Marital status:   Single            Spouse name:   Not on file    .   Number of children:   Not on file    .   Years of education:   Not on file    .   Highest education level:   8th grade    Occupational History    .   Not on file    Tobacco Use    .   Smoking status:   Current Every Day Smoker            Packs/day:   0.50            Years:   32.00            Pack years:   16.00            Types:   Cigarettes    .   Smokeless tobacco:   Never Used    .   Tobacco comment: daily    Substance and Sexual Activity    .   Alcohol use:   Yes            Alcohol/week:   7.0 standard drinks            Types:   7 Cans of beer per week    .   Drug use:   No    .   Sexual activity:   Not on file    Other Topics   Concern    .   Not on file    Social History Narrative        Lives locally by himself.  Does not routinely exercise.        Social Determinants of Health           Financial Resource Strain:     .   Difficulty of Paying  Living Expenses: Not on file    Food Insecurity:     .   Worried About Charity fundraiser in the Last Year: Not on file    .   Ran Out of Food in the Last Year: Not on file    Transportation Needs:     .   Lack of Transportation (Medical): Not on file    .   Lack of Transportation (Non-Medical): Not on file    Physical Activity:     .   Days of Exercise per Week: Not on file    .   Minutes of Exercise per Session: Not on file    Stress:     .   Feeling of Stress : Not on file    Social Connections:     .   Frequency of Communication with Friends and Family: Not on file    .   Frequency of Social Gatherings with Friends and  Family: Not on file    .   Attends Religious Services: Not on file    .   Active Member of Clubs or Organizations: Not on file    .   Attends Archivist Meetings: Not on file    .   Marital Status: Not on file          Family History:   The patient's family history includes Healthy in his sister; Heart disease in his brother and father. There is no history of Stroke.     ROS:    Review of Systems   Constitutional: Positive for malaise/fatigue. Negative for chills, diaphoresis, fever and weight loss.   HENT: Negative for congestion.    Eyes: Negative for discharge and redness.   Respiratory: Negative for cough, hemoptysis, sputum production, shortness of breath and wheezing.    Cardiovascular: Negative for chest pain, palpitations, orthopnea, claudication, leg swelling and PND.   Gastrointestinal: Positive for constipation. Negative for abdominal pain, blood in stool, diarrhea, heartburn, melena, nausea and vomiting.   Genitourinary: Negative for hematuria.   Musculoskeletal: Negative for falls and myalgias.   Skin: Negative for rash.   Neurological: Positive for weakness. Negative for dizziness, tingling, tremors, sensory change, speech change, focal weakness and loss of  consciousness.   Endo/Heme/Allergies: Does not bruise/bleed easily.   Psychiatric/Behavioral: Negative for substance abuse. The patient is not nervous/anxious.    All other systems reviewed and are negative.            EKGs/Labs/Other Studies Reviewed:        Studies reviewed were summarized above. The additional studies were reviewed today:     Zio patch 09/2019:  Predominant rhythm of sinus with an average heart rate of 56 bpm with a range of 50 to 97 bpm.  First-degree AV block was noted.  2 atrial runs occurred with the fastest being 4 beats with a maximum rate of 97 bpm and the longest lasting 5 beats with an average rate of 92 bpm.  Isolated PACs, atrial couplets, PVCs, and ventricular couplets were rare.  Patient triggered events corresponded to sinus rhythm along with short atrial runs.  __________     2D echo 05/2019:  1. The left ventricle has low normal systolic function, with an ejection  fraction of 50-55%. The cavity size was normal. There is moderately  increased left ventricular wall thickness. Left ventricular diastolic  Doppler parameters are consistent with  pseudonormalization. Elevated mean left atrial pressure No evidence of  left ventricular regional wall motion abnormalities.   2. The right ventricle has moderately reduced systolic function. The  cavity was mildly enlarged. There is no increase in right ventricular wall  thickness. Right ventricular systolic pressure is moderately elevated with  an estimated pressure of 56.7  mmHg.   3. Left atrial size was severely dilated.   4. The aortic valve is tricuspid.   5. The aorta is normal unless otherwise noted.   6. Mildly dilated pulmonary artery.   7. The inferior vena cava was dilated in size with <50% respiratory  variability.   8. The interatrial septum was not well visualized.  __________     Carlton Adam MPI 05/2019:  Pharmacological myocardial perfusion imaging study with no significant  ischemia  Small mild fixed apical perfusion defect, unable to exclude attenuation artifact vs small prior MI.  Normal wall motion, EF estimated at 40% (decreased EF secondary to GI uptake artifact)  No EKG changes concerning for ischemia at peak  stress or in recovery.  Baseline EKG with ST abn in V5 and V6, more pronounced in V4 to V6 with lexiscan infusion  Low risk scan        EKG:  EKG is ordered today.  The EKG ordered today demonstrates sinus bradycardia, 57 bpm, first-degree AV block, left atrial enlargement, LVH with nonspecific IVCD, QTc 502 ms     Recent Labs:  05/28/2019: B Natriuretic Peptide >4,500.0  07/17/2019: Magnesium 2.5  10/09/2019: ALT 11; BUN 40; Creatinine, Ser 8.53; Hemoglobin 12.0; Platelets 330; Sodium 132; TSH 1.230  10/10/2019: Potassium 4.3   Recent Lipid Panel  Labs (Brief)                                                                                                                                                                                             PHYSICAL EXAM:        VS:  BP 118/72 (BP Location: Left Arm, Patient Position: Sitting, Cuff Size: Normal)   Pulse (!) 57   Ht 5\' 7"  (1.702 m)   Wt 150 lb 8 oz (68.3 kg)   SpO2 98%   BMI 23.57 kg/m   BMI: Body mass index is 23.57 kg/m.     Physical Exam   Constitutional: He is oriented to person, place, and time. He appears well-developed and well-nourished.   HENT:   Head: Normocephalic and atraumatic.   Eyes: Right eye exhibits no discharge. Left eye exhibits no discharge.   Neck: No JVD present.   Cardiovascular: Normal rate, regular rhythm, S1 normal, S2 normal and normal heart sounds. Exam reveals no distant heart sounds, no friction rub, no midsystolic click and no opening snap.   No murmur heard.  Pulses:       Posterior tibial pulses are  1+ on the right side and 1+ on the left side.   Pulmonary/Chest: Effort normal and breath sounds normal. No respiratory distress. He has no decreased breath sounds. He has no wheezes. He has no rales. He exhibits no tenderness.   Abdominal: Soft. He exhibits no distension. There is no abdominal tenderness.   Musculoskeletal:         General: No edema.     Cervical back: Normal range of motion.  Neurological: He is alert and oriented to person, place, and time.   Skin: Skin is warm and dry. No cyanosis. Nails show no clubbing.  Psychiatric: He has a normal Weeks and affect. His speech is normal and behavior is normal. Judgment and thought content normal.             Wt Readings from Last 3  Encounters:    11/06/19   150 lb 8 oz (68.3 kg)    10/11/19   153 lb 12.8 oz (69.8 kg)    10/09/19   150 lb 12 oz (68.4 kg)           ASSESSMENT & PLAN:       1.CAD status post CABG without angina: He is doing well without any symptoms concerning for angina.  Recent Lexiscan MPI without significant ischemia and low risk as outlined above.  Restart aspirin 81 mg daily with discontinuation of Eliquis as outlined below.  He will otherwise continue amlodipine, atorvastatin, and carvedilol.  No plan for further ischemic evaluation at this time.      2.Lone A. Fib: Patient was noted to have brief episode of A. fib during acute illness as outlined above.  Subsequent outpatient cardiac monitoring has shown no recurrence of significant A. fib.  Given this, and in the setting of history of hematochezia and hemoptysis as well as with the patient being high bleeding risk secondary to dialysis, after discussion with his primary cardiologist, we have agreed to discontinue Eliquis.  Also, given this was an isolated episode of A. fib in the setting of acute illness, and with slightly prolonged QTc we have discontinued amiodarone.  With his sinus bradycardia and first-degree AV block we will  decrease carvedilol to 12.5 mg twice daily.        3.HFpEF/pulmonary hypertension: Volume is managed by hemodialysis 4 days/week with some daily urine output.  His pulmonary pretension is likely in the setting of underlying lung disease with COPD secondary to tobacco use.  Remains on Lasix 80 mg twice daily.      4.ESRD: Compliant with dialysis 4 days/week.  Followed by nephrology.      5.Anemia/hematochezia/hemoptysis: Patient indicates symptoms have resolved.  Most recent CBC showed stable hemoglobin of 12.      6.HTN: Blood pressure is well controlled today.  Continue reduced dose carvedilol as outlined above along with amlodipine, hydralazine, clonidine and Lasix.      7.HLD: LDL of 54 from 05/2019, with normal liver function from 09/2019.  Continue atorvastatin 20 mg daily.      8.Polysubstance use: Complete cessation of alcohol and tobacco is advised.  He has previously been taken off the kidney transplant list at [facility] due to ongoing substance use.      9.Preoperative cardiac evaluation: Patient is scheduled for dialysis permacath later this week.  We have discontinued Eliquis as outlined above and started the patient back on aspirin given his history of CAD.  Recent Lexiscan Myoview showed no significant ischemia and was overall a low risk.  Echo showed low normal LV systolic function as outlined above.  He does not have any complaints of anginal symptoms at this time.  At baseline, his functional status is quite limited secondary to his comorbid conditions.  That said, he does indicate to me he is able to achieve 4 METs.  Per Revised Cardiac Index, he is high risk for noncardiac procedure with an estimated rate of greater than 11% for MI, pulmonary edema, VF, cardiac arrest, or complete heart block.  No further cardiac testing will mitigate this risk at this time, and he may proceed at an overall high risk.      10.Medication management: Patient does admit to some  confusion regarding his medications.  We will reach out to his pharmacy and see if they can supply him with blister packs of medication.  If not, would  recommend he get established with home health to assist with his daily medications and other ADLs as indicated.               Disposition: F/u with Dr. Fletcher Anon or an APP in 2 months.        Medication Adjustments/Labs and Tests Ordered:  Current medicines are reviewed at length with the patient today.  Concerns regarding medicines are outlined above. Medication changes, Labs and Tests ordered today are summarized above and listed in the Patient Instructions accessible in Encounters.      SignedChristell Faith, PA-C  11/06/2019 11:11 AM      [facility] [place]  [address] [place] [address] [place],  [address]  [phone]          Electronically signed by Daryl Mu, PA-C at 11/06/2019 12:29 PM             Office Visit on 11/06/2019               Detailed Report             Note shared with patient         Note shared with patient

## 2019-11-27 NOTE — Progress Notes (Signed)
Subjective:    Patient ID: Daryl Weeks, male    DOB: 22-Sep-1960, 59 y.o.   MRN: [id number]  Daryl Weeks is a 59 y.o. male presenting on 11/27/2019 for Cyst (Right side onset 2 month soreness )   HPI   Right Elbow Cyst Chronic problem cyst on outside of R elbow for >10-15 years, recently in past 2 months it increased in size with swelling. No known trigger or injury. Takes OTC Tylenol PRN for pain. Worse if he leans on the mass on his elbow. Keeps him awake at times overnight. He can still bend and flex elbow. He is asking to have it removed. - Denies any ulceration laceration or drainage, bleeding, fever chills   Depression screen [facility] 2/9 11/27/2019 08/23/2019 02/21/2018  Decreased Interest 0 0 0  Down, Depressed, Hopeless 0 0 3  PHQ - 2 Score 0 0 3  Altered sleeping - - 3  Tired, decreased energy - - 0  Change in appetite - - 0  Feeling bad or failure about yourself  - - 0  Trouble concentrating - - 2  Moving slowly or fidgety/restless - - 0  Suicidal thoughts - - 0  PHQ-9 Score - - 8  Difficult doing work/chores - - Somewhat difficult    Social History   Tobacco Use  . Smoking status: Current Every Day Smoker    Packs/day: 0.50    Years: 32.00    Pack years: 16.00    Types: Cigarettes  . Smokeless tobacco: Never Used  . Tobacco comment: daily  Substance Use Topics  . Alcohol use: Yes    Alcohol/week: 7.0 standard drinks    Types: 7 Cans of beer per week  . Drug use: No    Review of Systems Per HPI unless specifically indicated above     Objective:    BP 134/70   Pulse (!) 57   Temp (!) 97.4 F (36.3 C) (Temporal)   Resp 16   Ht 5\' 7"  (1.702 m)   Wt 154 lb (69.9 kg)   BMI 24.12 kg/m   Wt Readings from Last 3 Encounters:  11/27/19 154 lb (69.9 kg)  11/09/19 150 lb (68 kg)  11/08/19 150 lb (68 kg)    Physical Exam Vitals and nursing note reviewed.  Constitutional:      General: He is not in acute distress.    Appearance: He is well-developed. He  is not diaphoretic.     Comments: Well-appearing, comfortable, cooperative  HENT:     Head: Normocephalic and atraumatic.  Eyes:     General:        Right eye: No discharge.        Left eye: No discharge.     Conjunctiva/sclera: Conjunctivae normal.  Cardiovascular:     Rate and Rhythm: Normal rate.  Pulmonary:     Effort: Pulmonary effort is normal.  Musculoskeletal:     Comments: Right upper extremity  Lateral forearm near elbow proximally with large 4 x 4 cm firm but still soft mobile mass appears subcutaneous, doesn't necessarily feel fluctuant or fluid filled. Mild tender. No erythema. No ulceration or opening. Not associated with olecranon bursa of elbow, seems inferior and lateral.  Skin:    General: Skin is warm and dry.     Findings: No erythema or rash.  Neurological:     Mental Status: He is alert and oriented to person, place, and time.  Psychiatric:        Behavior:  Behavior normal.     Comments: Well groomed, good eye contact, normal speech and thoughts    Results for orders placed or performed during the hospital encounter of 11/06/19  SARS CORONAVIRUS 2 (TAT 6-24 HRS) Nasopharyngeal Nasopharyngeal Swab   Specimen: Nasopharyngeal Swab  Result Value Ref Range   SARS Coronavirus 2 NEGATIVE NEGATIVE      Assessment & Plan:   Problem List Items Addressed This Visit    None    Visit Diagnoses    Subcutaneous mass of right forearm    -  Primary   Relevant Orders   Ambulatory referral to General Surgery      Clinically with large 4 x 4 cm soft tissue mobile mass over R lateral forearm now painful and interfere with his function. Issue has been a chronic mass for 10-15 years, now acute worsening in 2 months with inc in size - No sign of secondary infection. Not consistent with a abscess - Possibly large lipoma or cyst like structure. Doesn't feel too fluctuant. Not associated with olecranon bursa.  Given large size and likelihood of more solid structure inside  will defer any initial intervention today. No antibiotics needed at this time  Referral to General Surgery for surgical evaluation if can be excised  No orders of the defined types were placed in this encounter.     Follow up plan: Return in about 2 days (around 11/29/2019).   Nobie Putnam, [facility] 11/27/2019, 8:32 AM

## 2019-11-27 NOTE — Patient Instructions (Addendum)
Thank you for coming to the office today.  Stay tuned for apt with surgeon to remove the mass  [facility] Address: [address], [place], [place] [address] Phone: [phone]  [facility] Location [address] [place], [place]  [address] Hours: 8:30am to 5pm (M-F) Ph [phone]   Please schedule a Follow-up Appointment to: Return in about 2 days (around 11/29/2019).  If you have any other questions or concerns, please feel free to call the office or send a message through [place]. You may also schedule an earlier appointment if necessary.  Additionally, you may be receiving a survey about your experience at our office within a few days to 1 week by e-mail or mail. We value your feedback.  Nobie Putnam, DO [place]

## 2019-11-27 NOTE — Patient Instructions (Addendum)
Your procedure is scheduled on: 11-30-19 FRIDAY Report to Same Day Surgery 2nd floor medical mall [facility] Entrance-take elevator on left to 2nd floor.  Check in with surgery information desk.) To find out your arrival time please call [phone] between 1PM - 3PM on 11-29-19 THURSDAY  Remember: Instructions that are not followed completely may result in serious medical risk, up to and including death, or upon the discretion of your surgeon and anesthesiologist your surgery may need to be rescheduled.    _x___ 1. Do not eat food after midnight the night before your procedure. NO GUM OR CANDY AFTER MIDNIGHT. You may drink clear liquids up to 2 hours before you are scheduled to arrive at the hospital for your procedure.  Do not drink clear liquids within 2 hours of your scheduled arrival to the hospital.  Clear liquids include  --Water or Apple juice without pulp  -- Gatorade  --Black Coffee or Clear Tea (No milk, no creamers, do not add anything to the coffee or Tea   ____Ensure clear carbohydrate drink on the way to the hospital for bariatric patients  ____Ensure clear carbohydrate drink 3 hours before surgery.    __x__ 2. No Alcohol for 24 hours before or after surgery.   __x__3. No Smoking or e-cigarettes for 24 prior to surgery.  Do not use any chewable tobacco products for at least 6 hour prior to surgery   ____  4. Bring all medications with you on the day of surgery if instructed.    __x__ 5. Notify your doctor if there is any change in your medical condition     (cold, fever, infections).    x___6. On the morning of surgery brush your teeth with toothpaste and water.  You may rinse your mouth with mouth wash if you wish.  Do not swallow any toothpaste or mouthwash.   Do not wear jewelry, make-up, hairpins, clips or nail polish.  Do not wear lotions, powders, or perfumes.   Do not shave 48 hours prior to surgery. Men may shave face and neck.  Do not bring valuables to  the hospital.    [facility] is not responsible for any belongings or valuables.               Contacts, dentures or bridgework may not be worn into surgery.  Leave your suitcase in the car. After surgery it may be brought to your room.  For patients admitted to the hospital, discharge time is determined by your treatment team.  _  Patients discharged the day of surgery will not be allowed to drive home.  You will need someone to drive you home and stay with you the night of your procedure.    Please read over the following fact sheets that you were given:   [facility] Preparing for Surgery and or MRSA Information   _x___ TAKE THE FOLLOWING MEDICATION THE MORNING OF SURGERY WITH A SMALL SIP OF WATER. These include:  1. AMIODARONE (PACERONE)  2. NEURONTIN (GABAPENTIN)  3. LIPITOR (ATORVASTATIN)  4. COREG (CARVEDILOL)  5. CLONIDINE (CATAPRES)  6. HYDRALAZINE (APRESOLINE)  7. CLARITIN (LORATADINE)  ____Fleets enema or Magnesium Citrate as directed.   _x___ Use CHG Soap or sage wipes as directed on instruction sheet   _X___ Use inhalers on the day of surgery and bring to hospital day of surgery-USE YOUR TRELEGY ELLIPTA AND FLOVENT AM OF SURGERY  ____ Stop Metformin and Janumet 2 days prior to surgery.    ____  Take 1/2 of usual insulin dose the night before surgery and none on the morning surgery.   _x___ Follow recommendations from Cardiologist, Pulmonologist or PCP regarding stopping Aspirin, Coumadin, Plavix ,Eliquis, Effient, or Pradaxa, and Pletal-STOP YOUR ELIQUIS NOW (PT TOOK THIS AM 11-27-19) AND CONTINUE YOUR ASPIRIN  X____Stop Anti-inflammatories such as Advil, Aleve, Ibuprofen, Motrin, Naproxen, Naprosyn, Goodies powders or aspirin products NOW-OK to take Tylenol    ____ Stop supplements until after surgery.     ____ Bring C-Pap to the hospital.

## 2019-11-27 NOTE — Pre-Procedure Instructions (Addendum)
SECURE CHAT WITH DR Rosey Bath:  Just an FYI-Dr Schniers office got cardiac clearance on this pt who is having AVF on 3/19-Cardiology said he is high risk. I copy and pasted note from cardiology in [place] like he is as optimised as we can get him, and they said okay to proceed as high risk patient. As long as the patient is aware that he is high risk and wants to proceed with his procedure we should be good to go.

## 2019-11-28 DIAGNOSIS — D631 Anemia in chronic kidney disease: Secondary | ICD-10-CM | POA: Diagnosis not present

## 2019-11-28 DIAGNOSIS — D509 Iron deficiency anemia, unspecified: Secondary | ICD-10-CM | POA: Diagnosis not present

## 2019-11-28 DIAGNOSIS — N2581 Secondary hyperparathyroidism of renal origin: Secondary | ICD-10-CM | POA: Diagnosis not present

## 2019-11-28 DIAGNOSIS — N186 End stage renal disease: Secondary | ICD-10-CM | POA: Diagnosis not present

## 2019-11-28 DIAGNOSIS — Z992 Dependence on renal dialysis: Secondary | ICD-10-CM | POA: Diagnosis not present

## 2019-11-28 DIAGNOSIS — Z23 Encounter for immunization: Secondary | ICD-10-CM | POA: Diagnosis not present

## 2019-11-29 ENCOUNTER — Ambulatory Visit: Payer: Medicare Other | Admitting: Family Medicine

## 2019-11-29 ENCOUNTER — Telehealth: Payer: Self-pay

## 2019-11-29 ENCOUNTER — Other Ambulatory Visit
Admission: RE | Admit: 2019-11-29 | Discharge: 2019-11-29 | Disposition: A | Discharge status: Home / Self Care | Payer: Medicare Other | Source: Ambulatory Visit | Attending: Vascular Surgery | Admitting: Vascular Surgery

## 2019-11-29 DIAGNOSIS — Z79899 Other long term (current) drug therapy: Secondary | ICD-10-CM | POA: Diagnosis not present

## 2019-11-29 DIAGNOSIS — I5032 Chronic diastolic (congestive) heart failure: Secondary | ICD-10-CM | POA: Insufficient documentation

## 2019-11-29 DIAGNOSIS — D631 Anemia in chronic kidney disease: Secondary | ICD-10-CM | POA: Diagnosis not present

## 2019-11-29 DIAGNOSIS — N186 End stage renal disease: Secondary | ICD-10-CM | POA: Insufficient documentation

## 2019-11-29 DIAGNOSIS — I252 Old myocardial infarction: Secondary | ICD-10-CM | POA: Insufficient documentation

## 2019-11-29 DIAGNOSIS — F17210 Nicotine dependence, cigarettes, uncomplicated: Secondary | ICD-10-CM | POA: Diagnosis not present

## 2019-11-29 DIAGNOSIS — Z01812 Encounter for preprocedural laboratory examination: Secondary | ICD-10-CM | POA: Diagnosis not present

## 2019-11-29 DIAGNOSIS — G4730 Sleep apnea, unspecified: Secondary | ICD-10-CM | POA: Diagnosis not present

## 2019-11-29 DIAGNOSIS — N2581 Secondary hyperparathyroidism of renal origin: Secondary | ICD-10-CM | POA: Diagnosis not present

## 2019-11-29 DIAGNOSIS — I132 Hypertensive heart and chronic kidney disease with heart failure and with stage 5 chronic kidney disease, or end stage renal disease: Secondary | ICD-10-CM | POA: Insufficient documentation

## 2019-11-29 DIAGNOSIS — D509 Iron deficiency anemia, unspecified: Secondary | ICD-10-CM | POA: Diagnosis not present

## 2019-11-29 DIAGNOSIS — Z20822 Contact with and (suspected) exposure to covid-19: Secondary | ICD-10-CM | POA: Diagnosis not present

## 2019-11-29 DIAGNOSIS — I2510 Atherosclerotic heart disease of native coronary artery without angina pectoris: Secondary | ICD-10-CM | POA: Diagnosis not present

## 2019-11-29 DIAGNOSIS — Z992 Dependence on renal dialysis: Secondary | ICD-10-CM | POA: Diagnosis not present

## 2019-11-29 DIAGNOSIS — I480 Paroxysmal atrial fibrillation: Secondary | ICD-10-CM | POA: Insufficient documentation

## 2019-11-29 DIAGNOSIS — Z23 Encounter for immunization: Secondary | ICD-10-CM | POA: Diagnosis not present

## 2019-11-29 LAB — BASIC METABOLIC PANEL
Anion gap: 12 (ref 5–15)
BUN: 23 mg/dL — ABNORMAL HIGH (ref 6–20)
CO2: 25 mmol/L (ref 22–32)
Calcium: 9.1 mg/dL (ref 8.9–10.3)
Chloride: 95 mmol/L — ABNORMAL LOW (ref 98–111)
Creatinine, Ser: 5.52 mg/dL — ABNORMAL HIGH (ref 0.61–1.24)
GFR calc Af Amer: 12 mL/min — ABNORMAL LOW (ref >60)
GFR calc non Af Amer: 11 mL/min — ABNORMAL LOW (ref >60)
Glucose, Bld: 103 mg/dL — ABNORMAL HIGH (ref 70–99)
Potassium: 4.4 mmol/L (ref 3.5–5.1)
Sodium: 132 mmol/L — ABNORMAL LOW (ref 135–145)

## 2019-11-29 LAB — CBC WITH DIFFERENTIAL/PLATELET
Abs Immature Granulocytes: 0.06 10*3/uL (ref 0.00–0.07)
Basophils Absolute: 0.1 10*3/uL (ref 0.0–0.1)
Basophils Relative: 1 %
Eosinophils Absolute: 0.2 10*3/uL (ref 0.0–0.5)
Eosinophils Relative: 3 %
HCT: 28.4 % — ABNORMAL LOW (ref 39.0–52.0)
Hemoglobin: 9.8 g/dL — ABNORMAL LOW (ref 13.0–17.0)
Immature Granulocytes: 1 %
Lymphocytes Relative: 12 %
Lymphs Abs: 1.1 10*3/uL (ref 0.7–4.0)
MCH: 33.8 pg (ref 26.0–34.0)
MCHC: 34.5 g/dL (ref 30.0–36.0)
MCV: 97.9 fL (ref 80.0–100.0)
Monocytes Absolute: 1.5 10*3/uL — ABNORMAL HIGH (ref 0.1–1.0)
Monocytes Relative: 18 %
Neutro Abs: 5.8 10*3/uL (ref 1.7–7.7)
Neutrophils Relative %: 65 %
Platelets: 298 10*3/uL (ref 150–400)
RBC: 2.9 MIL/uL — ABNORMAL LOW (ref 4.22–5.81)
RDW: 16.9 % — ABNORMAL HIGH (ref 11.5–15.5)
WBC: 8.8 10*3/uL (ref 4.0–10.5)
nRBC: 0 % (ref 0.0–0.2)

## 2019-11-29 LAB — PROTIME-INR
INR: 1.1 (ref 0.8–1.2)
Prothrombin Time: 14.1 seconds (ref 11.4–15.2)

## 2019-11-29 LAB — TYPE AND SCREEN
ABO/RH(D): A POS
Antibody Screen: NEGATIVE

## 2019-11-29 LAB — APTT: aPTT: 44 seconds — ABNORMAL HIGH (ref 24–36)

## 2019-11-29 LAB — SARS CORONAVIRUS 2 (TAT 6-24 HRS): SARS Coronavirus 2: NEGATIVE

## 2019-11-29 NOTE — Telephone Encounter (Signed)
I called this patient on yesterday to reschedule his appt because of scheduling issues with [facility] credentialing. I notice that you placed a referral to general surgery, but you wanted him to f/u in 2 days. I attempted to switch the patient on your schedule, but he declined because he an appt today for pre op and COVID testing for surgery on Friday.  I can reach out to the general surgery office and  Lexine Baton and try to get the patient an appt scheduled for next week. Please advise.

## 2019-11-29 NOTE — Telephone Encounter (Signed)
Patient is medicare, I saw patient on 3/16 for abnormal mass growth as work in apt, he was originally scheduled w/ Elmyra Ricks for 3/18. I advised him return in 2 days as scheduled for his regular apt, unrelated to the mass.  Referral was to general surgery. This is not urgent. His apt is 3/22 he can keep that apt.  He does not need to return today if there is no new problem, he can be re-scheduled w/ Elmyra Ricks is credentialed  Nobie Putnam, [facility] 11/29/2019, 9:24 AM

## 2019-11-30 ENCOUNTER — Encounter
Admission: RE | Disposition: A | Discharge status: Home / Self Care | Payer: Self-pay | Source: Home / Self Care | Attending: Vascular Surgery

## 2019-11-30 ENCOUNTER — Ambulatory Visit
Admission: RE | Admit: 2019-11-30 | Discharge: 2019-11-30 | Disposition: A | Discharge status: Home / Self Care | Payer: Medicare Other | Attending: Vascular Surgery | Admitting: Vascular Surgery

## 2019-11-30 ENCOUNTER — Ambulatory Visit: Payer: Medicare Other | Admitting: Anesthesiology

## 2019-11-30 ENCOUNTER — Other Ambulatory Visit: Payer: Self-pay

## 2019-11-30 ENCOUNTER — Ambulatory Visit: Payer: Medicare Other

## 2019-11-30 ENCOUNTER — Encounter: Payer: Self-pay | Admitting: Vascular Surgery

## 2019-11-30 DIAGNOSIS — G8918 Other acute postprocedural pain: Secondary | ICD-10-CM | POA: Diagnosis not present

## 2019-11-30 DIAGNOSIS — Z7951 Long term (current) use of inhaled steroids: Secondary | ICD-10-CM | POA: Diagnosis not present

## 2019-11-30 DIAGNOSIS — E785 Hyperlipidemia, unspecified: Secondary | ICD-10-CM | POA: Insufficient documentation

## 2019-11-30 DIAGNOSIS — Z8249 Family history of ischemic heart disease and other diseases of the circulatory system: Secondary | ICD-10-CM | POA: Diagnosis not present

## 2019-11-30 DIAGNOSIS — I252 Old myocardial infarction: Secondary | ICD-10-CM | POA: Diagnosis not present

## 2019-11-30 DIAGNOSIS — Z992 Dependence on renal dialysis: Secondary | ICD-10-CM | POA: Diagnosis not present

## 2019-11-30 DIAGNOSIS — I5030 Unspecified diastolic (congestive) heart failure: Secondary | ICD-10-CM | POA: Insufficient documentation

## 2019-11-30 DIAGNOSIS — Z7901 Long term (current) use of anticoagulants: Secondary | ICD-10-CM | POA: Diagnosis not present

## 2019-11-30 DIAGNOSIS — I132 Hypertensive heart and chronic kidney disease with heart failure and with stage 5 chronic kidney disease, or end stage renal disease: Secondary | ICD-10-CM | POA: Insufficient documentation

## 2019-11-30 DIAGNOSIS — M069 Rheumatoid arthritis, unspecified: Secondary | ICD-10-CM | POA: Diagnosis not present

## 2019-11-30 DIAGNOSIS — J449 Chronic obstructive pulmonary disease, unspecified: Secondary | ICD-10-CM | POA: Diagnosis not present

## 2019-11-30 DIAGNOSIS — F17210 Nicotine dependence, cigarettes, uncomplicated: Secondary | ICD-10-CM | POA: Insufficient documentation

## 2019-11-30 DIAGNOSIS — N186 End stage renal disease: Secondary | ICD-10-CM | POA: Diagnosis not present

## 2019-11-30 DIAGNOSIS — I5032 Chronic diastolic (congestive) heart failure: Secondary | ICD-10-CM | POA: Diagnosis not present

## 2019-11-30 DIAGNOSIS — K219 Gastro-esophageal reflux disease without esophagitis: Secondary | ICD-10-CM | POA: Insufficient documentation

## 2019-11-30 DIAGNOSIS — Z95828 Presence of other vascular implants and grafts: Secondary | ICD-10-CM | POA: Diagnosis not present

## 2019-11-30 DIAGNOSIS — Z951 Presence of aortocoronary bypass graft: Secondary | ICD-10-CM | POA: Insufficient documentation

## 2019-11-30 DIAGNOSIS — G4730 Sleep apnea, unspecified: Secondary | ICD-10-CM | POA: Diagnosis not present

## 2019-11-30 DIAGNOSIS — Z419 Encounter for procedure for purposes other than remedying health state, unspecified: Secondary | ICD-10-CM

## 2019-11-30 DIAGNOSIS — Z7982 Long term (current) use of aspirin: Secondary | ICD-10-CM | POA: Insufficient documentation

## 2019-11-30 DIAGNOSIS — N185 Chronic kidney disease, stage 5: Secondary | ICD-10-CM | POA: Diagnosis not present

## 2019-11-30 DIAGNOSIS — I2510 Atherosclerotic heart disease of native coronary artery without angina pectoris: Secondary | ICD-10-CM | POA: Insufficient documentation

## 2019-11-30 DIAGNOSIS — I480 Paroxysmal atrial fibrillation: Secondary | ICD-10-CM | POA: Insufficient documentation

## 2019-11-30 DIAGNOSIS — M1990 Unspecified osteoarthritis, unspecified site: Secondary | ICD-10-CM | POA: Insufficient documentation

## 2019-11-30 DIAGNOSIS — M79602 Pain in left arm: Secondary | ICD-10-CM | POA: Diagnosis not present

## 2019-11-30 DIAGNOSIS — Z888 Allergy status to other drugs, medicaments and biological substances status: Secondary | ICD-10-CM | POA: Diagnosis not present

## 2019-11-30 DIAGNOSIS — Z79899 Other long term (current) drug therapy: Secondary | ICD-10-CM | POA: Diagnosis not present

## 2019-11-30 HISTORY — PX: AV FISTULA PLACEMENT: SHX1204

## 2019-11-30 LAB — POCT I-STAT, CHEM 8
BUN: 18 mg/dL (ref 6–20)
Calcium, Ion: 1.2 mmol/L (ref 1.15–1.40)
Chloride: 96 mmol/L — ABNORMAL LOW (ref 98–111)
Creatinine, Ser: 5.8 mg/dL — ABNORMAL HIGH (ref 0.61–1.24)
Glucose, Bld: 88 mg/dL (ref 70–99)
HCT: 35 % — ABNORMAL LOW (ref 39.0–52.0)
Hemoglobin: 11.9 g/dL — ABNORMAL LOW (ref 13.0–17.0)
Potassium: 4 mmol/L (ref 3.5–5.1)
Sodium: 130 mmol/L — ABNORMAL LOW (ref 135–145)
TCO2: 26 mmol/L (ref 22–32)

## 2019-11-30 SURGERY — ARTERIOVENOUS (AV) FISTULA CREATION
Anesthesia: Regional | Laterality: Left

## 2019-11-30 MED ORDER — ONDANSETRON HCL 4 MG/2ML IJ SOLN
INTRAMUSCULAR | Status: DC | PRN
  Administered 2019-11-30: 4 mg via INTRAVENOUS

## 2019-11-30 MED ORDER — BUPIVACAINE HCL (PF) 0.5 % IJ SOLN
INTRAMUSCULAR | Status: AC
  Filled 2019-11-30: qty 10

## 2019-11-30 MED ORDER — HYDROCODONE-ACETAMINOPHEN 5-325 MG PO TABS: 1.0000 | ORAL_TABLET | Freq: Four times a day (QID) | ORAL | 0 refills | Status: DC | PRN

## 2019-11-30 MED ORDER — SODIUM CHLORIDE 0.9 % IV SOLN: INTRAVENOUS | Status: DC

## 2019-11-30 MED ORDER — PROPOFOL 10 MG/ML IV BOLUS
INTRAVENOUS | Status: DC | PRN
  Administered 2019-11-30: 30 mg via INTRAVENOUS

## 2019-11-30 MED ORDER — BUPIVACAINE HCL (PF) 0.5 % IJ SOLN
INTRAMUSCULAR | Status: AC
  Filled 2019-11-30: qty 30

## 2019-11-30 MED ORDER — CEFAZOLIN SODIUM-DEXTROSE 1-4 GM/50ML-% IV SOLN
INTRAVENOUS | Status: AC
  Filled 2019-11-30: qty 50

## 2019-11-30 MED ORDER — LIDOCAINE HCL (PF) 1 % IJ SOLN
INTRAMUSCULAR | Status: DC | PRN
  Administered 2019-11-30: 5 mL

## 2019-11-30 MED ORDER — HEMOSTATIC AGENTS (NO CHARGE) OPTIME
TOPICAL | Status: DC | PRN
  Administered 2019-11-30: 1 via TOPICAL

## 2019-11-30 MED ORDER — ONDANSETRON HCL 4 MG/2ML IJ SOLN: 4.0000 mg | Freq: Once | INTRAMUSCULAR | Status: DC | PRN

## 2019-11-30 MED ORDER — GLYCOPYRROLATE 0.2 MG/ML IJ SOLN
INTRAMUSCULAR | Status: AC
  Filled 2019-11-30: qty 1

## 2019-11-30 MED ORDER — OXYCODONE HCL 5 MG PO TABS: 5.0000 mg | ORAL_TABLET | Freq: Once | ORAL | Status: DC | PRN

## 2019-11-30 MED ORDER — SODIUM CHLORIDE 0.9 % IV SOLN
INTRAVENOUS | Status: DC | PRN
  Administered 2019-11-30: 60 mL via INTRAMUSCULAR

## 2019-11-30 MED ORDER — LIDOCAINE HCL (PF) 1 % IJ SOLN
INTRAMUSCULAR | Status: AC
  Filled 2019-11-30: qty 5

## 2019-11-30 MED ORDER — ACETAMINOPHEN 10 MG/ML IV SOLN: 1000.0000 mg | Freq: Once | INTRAVENOUS | Status: DC | PRN

## 2019-11-30 MED ORDER — BUPIVACAINE LIPOSOME 1.3 % IJ SUSP
INTRAMUSCULAR | Status: AC
  Filled 2019-11-30: qty 20

## 2019-11-30 MED ORDER — MIDAZOLAM HCL 2 MG/2ML IJ SOLN
1.0000 mg | INTRAMUSCULAR | Status: DC | PRN
  Administered 2019-11-30: 10:00:00 1 mg via INTRAVENOUS

## 2019-11-30 MED ORDER — DEXMEDETOMIDINE HCL 200 MCG/2ML IV SOLN
INTRAVENOUS | Status: DC | PRN
  Administered 2019-11-30 (×3): 4 ug via INTRAVENOUS
  Administered 2019-11-30: 8 ug via INTRAVENOUS
  Administered 2019-11-30 (×2): 4 ug via INTRAVENOUS

## 2019-11-30 MED ORDER — PROPOFOL 500 MG/50ML IV EMUL
INTRAVENOUS | Status: DC | PRN
  Administered 2019-11-30: 75 ug/kg/min via INTRAVENOUS

## 2019-11-30 MED ORDER — CHLORHEXIDINE GLUCONATE CLOTH 2 % EX PADS
6.0000 | MEDICATED_PAD | Freq: Once | CUTANEOUS | Status: AC
  Administered 2019-11-30: 10:00:00 6 via TOPICAL

## 2019-11-30 MED ORDER — HEPARIN SODIUM (PORCINE) 5000 UNIT/ML IJ SOLN
INTRAMUSCULAR | Status: AC
  Filled 2019-11-30: qty 1

## 2019-11-30 MED ORDER — MIDAZOLAM HCL 2 MG/2ML IJ SOLN
INTRAMUSCULAR | Status: AC
  Filled 2019-11-30: qty 2

## 2019-11-30 MED ORDER — GLYCOPYRROLATE 0.2 MG/ML IJ SOLN
INTRAMUSCULAR | Status: DC | PRN
  Administered 2019-11-30: .1 mg via INTRAVENOUS

## 2019-11-30 MED ORDER — FENTANYL CITRATE (PF) 100 MCG/2ML IJ SOLN
50.0000 ug | INTRAMUSCULAR | Status: DC | PRN
  Administered 2019-11-30: 10:00:00 50 ug via INTRAVENOUS

## 2019-11-30 MED ORDER — LIDOCAINE HCL (PF) 2 % IJ SOLN
INTRAMUSCULAR | Status: AC
  Filled 2019-11-30: qty 5

## 2019-11-30 MED ORDER — FAMOTIDINE 20 MG PO TABS: 20.0000 mg | ORAL_TABLET | Freq: Once | ORAL | Status: DC

## 2019-11-30 MED ORDER — OXYCODONE HCL 5 MG/5ML PO SOLN: 5.0000 mg | Freq: Once | ORAL | Status: DC | PRN

## 2019-11-30 MED ORDER — PROPOFOL 500 MG/50ML IV EMUL
INTRAVENOUS | Status: AC
  Filled 2019-11-30: qty 50

## 2019-11-30 MED ORDER — ONDANSETRON HCL 4 MG/2ML IJ SOLN: 4.0000 mg | Freq: Four times a day (QID) | INTRAMUSCULAR | Status: DC | PRN

## 2019-11-30 MED ORDER — EPHEDRINE SULFATE 50 MG/ML IJ SOLN
INTRAMUSCULAR | Status: DC | PRN
  Administered 2019-11-30: 5 mg via INTRAVENOUS

## 2019-11-30 MED ORDER — MIDAZOLAM HCL 2 MG/2ML IJ SOLN
INTRAMUSCULAR | Status: DC | PRN
  Administered 2019-11-30 (×2): 1 mg via INTRAVENOUS

## 2019-11-30 MED ORDER — FENTANYL CITRATE (PF) 100 MCG/2ML IJ SOLN
INTRAMUSCULAR | Status: AC
  Filled 2019-11-30: qty 2

## 2019-11-30 MED ORDER — PAPAVERINE HCL 30 MG/ML IJ SOLN
INTRAMUSCULAR | Status: AC
  Filled 2019-11-30: qty 2

## 2019-11-30 MED ORDER — LIDOCAINE HCL (CARDIAC) PF 100 MG/5ML IV SOSY
PREFILLED_SYRINGE | INTRAVENOUS | Status: DC | PRN
  Administered 2019-11-30: 100 mg via INTRAVENOUS

## 2019-11-30 MED ORDER — BUPIVACAINE HCL (PF) 0.5 % IJ SOLN
INTRAMUSCULAR | Status: DC | PRN
  Administered 2019-11-30: 20 mL

## 2019-11-30 MED ORDER — FENTANYL CITRATE (PF) 100 MCG/2ML IJ SOLN: 25.0000 ug | INTRAMUSCULAR | Status: DC | PRN

## 2019-11-30 MED ORDER — CEFAZOLIN SODIUM-DEXTROSE 1-4 GM/50ML-% IV SOLN
1.0000 g | INTRAVENOUS | Status: AC
  Administered 2019-11-30: 1 g via INTRAVENOUS

## 2019-11-30 MED ORDER — HYDROCODONE-ACETAMINOPHEN 5-325 MG PO TABS
ORAL_TABLET | ORAL | Status: AC
  Filled 2019-11-30: qty 1

## 2019-11-30 MED ORDER — LACTATED RINGERS IV SOLN: INTRAVENOUS | Status: DC | PRN

## 2019-11-30 MED ORDER — BUPIVACAINE LIPOSOME 1.3 % IJ SUSP
INTRAMUSCULAR | Status: DC | PRN
  Administered 2019-11-30: 6 mL

## 2019-11-30 MED ORDER — HYDROMORPHONE HCL 1 MG/ML IJ SOLN: 1.0000 mg | Freq: Once | INTRAMUSCULAR | Status: DC | PRN

## 2019-11-30 MED ORDER — HYDROCODONE-ACETAMINOPHEN 5-325 MG PO TABS
1.0000 | ORAL_TABLET | Freq: Once | ORAL | Status: AC
  Administered 2019-11-30: 13:00:00 1 via ORAL

## 2019-11-30 SURGICAL SUPPLY — 59 items
APPLIER CLIP 11 MED OPEN (CLIP)
APPLIER CLIP 9.375 SM OPEN (CLIP)
BAG DECANTER FOR FLEXI CONT (MISCELLANEOUS) ×3 IMPLANT
BLADE SURG SZ11 CARB STEEL (BLADE) ×3 IMPLANT
BOOT SUTURE AID YELLOW STND (SUTURE) ×3 IMPLANT
BRUSH SCRUB EZ  4% CHG (MISCELLANEOUS) ×2
BRUSH SCRUB EZ 4% CHG (MISCELLANEOUS) ×1 IMPLANT
CANISTER SUCT 1200ML W/VALVE (MISCELLANEOUS) ×3 IMPLANT
CHLORAPREP W/TINT 26 (MISCELLANEOUS) ×3 IMPLANT
CLIP APPLIE 11 MED OPEN (CLIP) IMPLANT
CLIP APPLIE 9.375 SM OPEN (CLIP) IMPLANT
COVER WAND RF STERILE (DRAPES) ×1 IMPLANT
DERMABOND ADVANCED (GAUZE/BANDAGES/DRESSINGS) ×2
DERMABOND ADVANCED .7 DNX12 (GAUZE/BANDAGES/DRESSINGS) ×1 IMPLANT
DRESSING SURGICEL FIBRLLR 1X2 (HEMOSTASIS) ×1 IMPLANT
DRSG SURGICEL FIBRILLAR 1X2 (HEMOSTASIS) ×3
ELECT CAUTERY BLADE 6.4 (BLADE) ×3 IMPLANT
ELECT REM PT RETURN 9FT ADLT (ELECTROSURGICAL) ×3
ELECTRODE REM PT RTRN 9FT ADLT (ELECTROSURGICAL) ×1 IMPLANT
GEL ULTRASOUND 20GR AQUASONIC (MISCELLANEOUS) IMPLANT
GLOVE BIO SURGEON STRL SZ7 (GLOVE) ×9 IMPLANT
GLOVE INDICATOR 7.5 STRL GRN (GLOVE) ×5 IMPLANT
GLOVE SURG SYN 8.0 (GLOVE) ×3 IMPLANT
GLOVE SURG SYN 8.0 PF PI (GLOVE) ×1 IMPLANT
GOWN STRL REUS W/ TWL LRG LVL3 (GOWN DISPOSABLE) ×2 IMPLANT
GOWN STRL REUS W/ TWL XL LVL3 (GOWN DISPOSABLE) ×1 IMPLANT
GOWN STRL REUS W/TWL LRG LVL3 (GOWN DISPOSABLE) ×6
GOWN STRL REUS W/TWL XL LVL3 (GOWN DISPOSABLE) ×2
IV NS 500ML (IV SOLUTION) ×2
IV NS 500ML BAXH (IV SOLUTION) ×1 IMPLANT
KIT TURNOVER KIT A (KITS) ×3 IMPLANT
LABEL OR SOLS (LABEL) ×3 IMPLANT
LOOP RED MAXI  1X406MM (MISCELLANEOUS) ×2
LOOP VESSEL MAXI 1X406 RED (MISCELLANEOUS) ×1 IMPLANT
LOOP VESSEL MINI 0.8X406 BLUE (MISCELLANEOUS) ×2 IMPLANT
LOOPS BLUE MINI 0.8X406MM (MISCELLANEOUS) ×4
NDL FILTER BLUNT 18X1 1/2 (NEEDLE) ×1 IMPLANT
NDL HYPO 30X.5 LL (NEEDLE) IMPLANT
NEEDLE FILTER BLUNT 18X 1/2SAF (NEEDLE)
NEEDLE FILTER BLUNT 18X1 1/2 (NEEDLE) IMPLANT
NEEDLE HYPO 30X.5 LL (NEEDLE) IMPLANT
PACK EXTREMITY ARMC (MISCELLANEOUS) ×3 IMPLANT
PAD PREP 24X41 OB/GYN DISP (PERSONAL CARE ITEMS) ×3 IMPLANT
SLING ARM LRG DEEP (SOFTGOODS) ×2 IMPLANT
STOCKINETTE 48X4 2 PLY STRL (GAUZE/BANDAGES/DRESSINGS) ×1 IMPLANT
STOCKINETTE STRL 4IN 9604848 (GAUZE/BANDAGES/DRESSINGS) ×3 IMPLANT
SUT MNCRL+ 5-0 UNDYED PC-3 (SUTURE) ×1 IMPLANT
SUT MONOCRYL 5-0 (SUTURE) ×2
SUT PROLENE 6 0 BV (SUTURE) ×8 IMPLANT
SUT SILK 2 0 (SUTURE) ×2
SUT SILK 2-0 18XBRD TIE 12 (SUTURE) ×1 IMPLANT
SUT SILK 3 0 (SUTURE) ×2
SUT SILK 3-0 18XBRD TIE 12 (SUTURE) ×1 IMPLANT
SUT SILK 4 0 (SUTURE) ×2
SUT SILK 4-0 18XBRD TIE 12 (SUTURE) ×1 IMPLANT
SUT VIC AB 3-0 SH 27 (SUTURE) ×2
SUT VIC AB 3-0 SH 27X BRD (SUTURE) ×1 IMPLANT
SYR 20ML LL LF (SYRINGE) ×3 IMPLANT
SYR 3ML LL SCALE MARK (SYRINGE) ×3 IMPLANT

## 2019-11-30 NOTE — Anesthesia Procedure Notes (Signed)
Anesthesia Regional Block: Supraclavicular block   Pre-Anesthetic Checklist: ,, timeout performed, Correct Patient, Correct Site, Correct Laterality, Correct Procedure, Correct Position, site marked, Risks and benefits discussed,  Surgical consent,  Pre-op evaluation,  At surgeon's request and post-op pain management  Laterality: Left and Upper  Prep: chloraprep       Needles:  Injection technique: Single-shot  Needle Type: Echogenic Needle     Needle Length: 4cm  Needle Gauge: 25     Additional Needles:   Narrative:  Start time: 11/30/2019 10:03 AM End time: 11/30/2019 10:07 AM Injection made incrementally with aspirations every 5 mL.  Performed by: Personally  Anesthesiologist: Arita Miss, MD  Additional Notes: Patient consented for risk and benefits of nerve block including but not limited to: nerve damage, failed block, bleeding and infection, hemidiaphragmatic paralysis and shortness of breath, Horner's syndrome.  Patient voiced understanding.  Functioning IV was confirmed and monitors were applied.  Sterile prep,hand hygiene and sterile gloves were used.  Minimal sedation used for procedure.   No paresthesia endorsed by patient during the procedure.  Negative aspiration and negative test dose prior to incremental administration of local anesthetic. The patient tolerated the procedure well with no immediate complications.

## 2019-11-30 NOTE — Discharge Instructions (Signed)

## 2019-11-30 NOTE — Anesthesia Postprocedure Evaluation (Signed)
Anesthesia Post Note  Patient: Daryl Weeks  Procedure(s) Performed: ARTERIOVENOUS (AV) FISTULA CREATION ( BRACHIAL CEPHALIC ) (Left )  Patient location during evaluation: PACU Anesthesia Type: Regional Level of consciousness: awake and alert Pain management: pain level controlled Vital Signs Assessment: post-procedure vital signs reviewed and stable Respiratory status: spontaneous breathing, nonlabored ventilation, respiratory function stable and patient connected to nasal cannula oxygen Cardiovascular status: blood pressure returned to baseline and stable Postop Assessment: no apparent nausea or vomiting Anesthetic complications: no     Last Vitals:  Vitals:   11/30/19 1245 11/30/19 1246  BP:  111/68  Pulse:  61  Resp: 10 10  Temp:    SpO2:  97%    Last Pain:  Vitals:   11/30/19 1246  TempSrc:   PainSc: 0-No pain                 Arita Miss

## 2019-11-30 NOTE — OR Nursing (Signed)
Verbal Dr. Delana Meyer, patient may resume aspirin tonight, and may resume eliquis tomorrow.  Added to med section on discharge instructions.

## 2019-11-30 NOTE — Anesthesia Preprocedure Evaluation (Signed)
Anesthesia Evaluation  Patient identified by MRN, date of birth, ID band Patient awake    Reviewed: Allergy & Precautions, H&P , NPO status , reviewed documented beta blocker date and time   Airway Mallampati: III  TM Distance: >3 FB Neck ROM: limited    Dental  (+) Poor Dentition, Chipped, Missing, Dental Advisory Given Extremely poor dentition, none reported as loose, advisory given:   Pulmonary shortness of breath and with exertion, sleep apnea , pneumonia, resolved, COPD, Current Smoker and Patient abstained from smoking.,  Takes inhalers PRN. No dyspnea at rest    + decreased breath sounds      Cardiovascular hypertension, + CAD and + Past MI  Normal cardiovascular exam  ECHO 05/2019  1. The left ventricle has low normal systolic function, with an ejection fraction of 50-55%. The cavity size was normal. There is moderately increased left ventricular wall thickness. Left ventricular diastolic Doppler parameters are consistent with  pseudonormalization. Elevated mean left atrial pressure No evidence of left ventricular regional wall motion abnormalities.  2. The right ventricle has moderately reduced systolic function. The cavity was mildly enlarged. There is no increase in right ventricular wall thickness. Right ventricular systolic pressure is moderately elevated with an estimated pressure of 56.7  mmHg.  3. Left atrial size was severely dilated.  4. The aortic valve is tricuspid.  5. The aorta is normal unless otherwise noted.  6. Mildly dilated pulmonary artery.  7. The inferior vena cava was dilated in size with <50% respiratory variability.  8. The interatrial septum was not well visualized.   Neuro/Psych PSYCHIATRIC DISORDERS Anxiety    GI/Hepatic GERD  Medicated and Controlled,  Endo/Other    Renal/GU ESRFRenal disease     Musculoskeletal   Abdominal   Peds  Hematology  (+) Blood dyscrasia, anemia ,    Anesthesia Other Findings Past Medical History: No date: (HFpEF) heart failure with preserved ejection fraction ([place])     Comment:  a. 05/2019 Echo: EF 50-55%, diast dysfxn, RVSP 56.47mmHg,               Sev dil LA. Mildly dil PA. No date: Anemia No date: Anxiety No date: Arthritis No date: Colon polyps No date: COPD (chronic obstructive pulmonary disease) (HCC) No date: Coronary artery disease     Comment:  a. 10/2013 NSTEMI/Cath: Severe 3VD-->CABG x 4 @ [facility]               01/2014 (LIMA->LAD, VG->OM1->OM2, VG->RCA); b. 11/2014 MV:               No isch/infarct; c. 05/2019 MV: EF 40% (50-55 by echo),               small,mild, fixed apical defect - ? atten vs infarct. No               ischemia->low risk. No date: ESRD (end stage renal disease) ([place])     Comment:  a. MWFSat HD No date: ETOH abuse     Comment:  a. 2 beers/night. No date: GERD (gastroesophageal reflux disease) No date: History of pneumonia No date: Hyperlipidemia No date: Hypertension No date: IgA nephropathy No date: Lower GI bleed     Comment:  a. Due to colon polyps. Status post resection of 14               polyps No date: Non-ST elevation MI (NSTEMI) ([place]) No date: PAF (paroxysmal atrial fibrillation) (HCC)     Comment:  a.  Dx 05/2019. CHA2DS2VASc = 3-->Eliquis/Amio. No date: Rheumatoid arthritis ([place]) No date: Shortness of breath No date: Sleep apnea No date: Tobacco abuse Past Surgical History: 10/28/2016: A/V FISTULAGRAM; Left     Comment:  Procedure: A/V Fistulagram;  Surgeon: [name], MD;                Location: [place] [facility];  Service: Cardiovascular;              Laterality: Left; 12/07/2018: A/V FISTULAGRAM; Left     Comment:  Procedure: A/V FISTULAGRAM;  Surgeon: [name], MD;               Location: [place] [facility];  Service: Cardiovascular;              Laterality: Left; 06/04/2019: A/V FISTULAGRAM; N/A     Comment:  Procedure: A/V Fistulagram- LEFT;  Surgeon: [name], MD;  Location: [place] [facility];  Service:               Cardiovascular;  Laterality: N/A; 10/28/2016: A/V SHUNT INTERVENTION; N/A     Comment:  Procedure: A/V Shunt Intervention;  Surgeon: [name], MD;  Location: [place] [facility];  Service:               Cardiovascular;  Laterality: N/A; 02/01/2019: A/V SHUNTOGRAM; Left     Comment:  Procedure: A/V SHUNTOGRAM;  Surgeon: [name], MD;                Location: [place] [facility];  Service: Cardiovascular;              Laterality: Left; No date: AV FISTULA PLACEMENT No date: CARDIAC CATHETERIZATION     Comment:  RCA 90% and calcified mid LAD 80% Stenosis 11/29/2013: CORONARY ARTERY BYPASS GRAFT; N/A     Comment:  Procedure: CORONARY ARTERY BYPASS GRAFTING (CABG) x 4               using endoscopically harvested right saphenous vein and               left internal mammary artery;  Surgeon: [name],               MD;  Location: [facility];  Service: Open Heart Surgery;                Laterality: N/A; 2/15: dialysis catheterr 02/15/2019: DIALYSIS/PERMA CATHETER REMOVAL; N/A     Comment:  Procedure: DIALYSIS/PERMA CATHETER REMOVAL;  Surgeon:               [name], MD;  Location: [place] [facility];                Service: Cardiovascular;  Laterality: N/A; 12/14/2018: INSERTION OF DIALYSIS CATHETER; N/A     Comment:  Procedure: INSERTION OF DIALYSIS CATHETER ( [place] );               Surgeon: [name], MD;  Location: [facility];  Service:              Vascular;  Laterality: N/A; 11/29/2013: INTRAOPERATIVE TRANSESOPHAGEAL ECHOCARDIOGRAM; N/A     Comment:  Procedure: INTRAOPERATIVE TRANSESOPHAGEAL  ECHOCARDIOGRAM;  Surgeon: [name], MD;  Location:               [place] [facility];  Service: Open Heart Surgery;  Laterality: N/A; 06/12/2015: PERIPHERAL VASCULAR CATHETERIZATION; N/A     Comment:  Procedure: A/V Shuntogram/Fistulagram;  Surgeon: [name], MD;  Location: [place] [facility];  Service:               Cardiovascular;  Laterality: N/A; 06/12/2015: PERIPHERAL VASCULAR CATHETERIZATION; Left     Comment:  Procedure: A/V Shunt Intervention;  Surgeon: [name], MD;  Location: [place] [facility];  Service:               Cardiovascular;  Laterality: Left; 14: RENAL BIOPSY; Left 12/14/2018: REVISON OF ARTERIOVENOUS FISTULA; Left     Comment:  Procedure: REVISON OF ARTERIOVENOUS FISTULA;  Surgeon:               [name], MD;  Location: [facility];  Service:               Vascular;  Laterality: Left;   Reproductive/Obstetrics                             Anesthesia Physical  Anesthesia Plan  ASA: IV  Anesthesia Plan: General and Regional   Post-op Pain Management:    Induction: Intravenous  PONV Risk Score and Plan: 2 and Ondansetron and Dexamethasone  Airway Management Planned: LMA and Natural Airway  Additional Equipment: None  Intra-op Plan:   Post-operative Plan: Extubation in OR  Informed Consent: I have reviewed the patients History and Physical, chart, labs and discussed the procedure including the risks, benefits and alternatives for the proposed anesthesia with the patient or authorized representative who has indicated his/her understanding and acceptance.     Dental advisory given  Plan Discussed with: CRNA and Surgeon  Anesthesia Plan Comments: (Discussed risks of anesthesia with patient, including PONV, sore throat, lip/dental damage. Rare risks discussed as well, such as cardiorespiratory and neurological sequelae. Patient understands.  Discussed r/b/a of supraclavicular nerve block, including:  - bleeding, infection, nerve damage - pneumothorax - shortness of breath from hemidiaphragmatic paralysis due to phrenic nerve blockade - poor or non functioning block. Patient understands. )        Anesthesia Quick Evaluation

## 2019-11-30 NOTE — H&P (Signed)
@LOGO @   MRN : [id number]  Daryl Weeks is a 59 y.o. (19-Dec-1960) male who presents with chief complaint of No chief complaint on file. [name]  History of Present Illness:   The patient presents to [facility] today for creation of dialysis access.  The patient has a history of prior failed accesses.  There have been multiple accesses and catheters.    He has had successful cardiac clearance.  He is s/p stenting in the left subclavian and he has a good cephalic vein  Current access is via a catheter which is functioning well.  There not been several episodes of catheter infection.  The patient denies amaurosis fugax or recent TIA symptoms. There are no recent neurological changes noted. The patient denies claudication symptoms or rest pain symptoms. The patient denies history of DVT, PE or superficial thrombophlebitis. The patient denies recent episodes of angina or shortness of breath.  Current Facility-Administered Medications for the 11/30/19 encounter [facility] Encounter)  Medication  . budesonide (PULMICORT) 180 MCG/ACT inhaler 1 puff   Current Meds  Medication Sig  . amiodarone (PACERONE) 200 MG tablet Take 200 mg by mouth every morning.   [name] apixaban (ELIQUIS) 2.5 MG TABS tablet Take 2.5 mg by mouth daily.  [name] aspirin EC 81 MG tablet Take 1 tablet (81 mg total) by mouth daily.  [name] atorvastatin (LIPITOR) 20 MG tablet take 1 tablet by mouth at bedtime (Patient taking differently: Take 20 mg by mouth every morning. )  . AURYXIA 1 GM 210 MG(Fe) tablet Take 420 mg by mouth 3 (three) times daily with meals.   . calcium carbonate (TUMS - DOSED IN MG ELEMENTAL CALCIUM) 500 MG chewable tablet Chew 1 tablet by mouth daily as needed for indigestion or heartburn.   . carvedilol (COREG) 12.5 MG tablet Take 1 tablet (12.5 mg total) by mouth 2 (two) times daily with a meal.  . cinacalcet (SENSIPAR) 30 MG tablet Take 30 mg by mouth daily with supper.   . cloNIDine (CATAPRES) 0.2 MG tablet Take 1 tablet  (0.2 mg total) by mouth 3 (three) times daily.  . Fluticasone Propionate, Inhal, (FLOVENT DISKUS) 100 MCG/BLIST AEPB Inhale 1 puff into the lungs 2 (two) times daily. (Patient taking differently: Inhale 1 puff into the lungs every morning. )  . Fluticasone-Umeclidin-Vilant (TRELEGY ELLIPTA) 100-62.5-25 MCG/INH AEPB Inhale 1 puff into the lungs daily. (Patient taking differently: Inhale 1 puff into the lungs every other day. )  . furosemide (LASIX) 80 MG tablet Take 80 mg by mouth 2 (two) times daily.   [name] gabapentin (NEURONTIN) 100 MG capsule Take 100 mg by mouth 3 (three) times daily.   . hydrALAZINE (APRESOLINE) 100 MG tablet Take 50 mg by mouth 3 (three) times daily.   [name] loratadine (CLARITIN) 10 MG tablet Take 1 tablet (10 mg total) by mouth daily. (Patient taking differently: Take 10 mg by mouth every morning. )  . ondansetron (ZOFRAN-ODT) 4 MG disintegrating tablet Take 4 mg by mouth every 8 (eight) hours as needed for nausea.   [name] umeclidinium-vilanterol (ANORO ELLIPTA) 62.5-25 MCG/INH AEPB Inhale 1 puff into the lungs daily.  [name] zolpidem (AMBIEN) 10 MG tablet Take 10 mg by mouth at bedtime as needed for sleep.    Past Medical History:  Diagnosis Date  . (HFpEF) heart failure with preserved ejection fraction ([place])    a. 05/2019 Echo: EF 50-55%, diast dysfxn, RVSP 56.67mmHg, Sev dil LA. Mildly dil PA.  [name] Anemia   . Anxiety   .  Arthritis   . Colon polyps   . COPD (chronic obstructive pulmonary disease) ([place])   . Coronary artery disease    a. 10/2013 NSTEMI/Cath: Severe 3VD-->CABG x 4 @ [facility] 01/2014 (LIMA->LAD, VG->OM1->OM2, VG->RCA); b. 11/2014 MV: No isch/infarct; c. 05/2019 MV: EF 40% (50-55 by echo), small,mild, fixed apical defect - ? atten vs infarct. No ischemia->low risk.  . ESRD (end stage renal disease) ([place])    a. MWFSat HD  . ETOH abuse    a. 2 beers/night.  [name] GERD (gastroesophageal reflux disease)   . History of pneumonia   . Hyperlipidemia   . Hypertension   . IgA nephropathy    . Lower GI bleed    a. Due to colon polyps. Status post resection of 14 polyps  . Non-ST elevation MI (NSTEMI) ([place]) 2016  . PAF (paroxysmal atrial fibrillation) ([place])    a. Dx 05/2019. CHA2DS2VASc = 3-->Eliquis/Amio.  . Pneumonia 08/2019  . Rheumatoid arthritis ([place])   . Shortness of breath    WITH EXERTION  . Sleep apnea   . Tobacco abuse     Past Surgical History:  Procedure Laterality Date  . A/V FISTULAGRAM Left 10/28/2016   Procedure: A/V Fistulagram;  Surgeon: [name], MD;  Location: [place] [facility];  Service: Cardiovascular;  Laterality: Left;  . A/V FISTULAGRAM Left 12/07/2018   Procedure: A/V FISTULAGRAM;  Surgeon: [name], MD;  Location: [place] [facility];  Service: Cardiovascular;  Laterality: Left;  . A/V FISTULAGRAM N/A 06/04/2019   Procedure: A/V Fistulagram- LEFT;  Surgeon: [name], MD;  Location: [place] [facility];  Service: Cardiovascular;  Laterality: N/A;  . A/V SHUNT INTERVENTION N/A 10/28/2016   Procedure: A/V Shunt Intervention;  Surgeon: [name], MD;  Location: [place] [facility];  Service: Cardiovascular;  Laterality: N/A;  . A/V SHUNTOGRAM Left 02/01/2019   Procedure: A/V SHUNTOGRAM;  Surgeon: [name], MD;  Location: [place] [facility];  Service: Cardiovascular;  Laterality: Left;  . AV FISTULA PLACEMENT    . CARDIAC CATHETERIZATION     RCA 90% and calcified mid LAD 80% Stenosis  . CORONARY ARTERY BYPASS GRAFT N/A 11/29/2013   Procedure: CORONARY ARTERY BYPASS GRAFTING (CABG) x 4 using endoscopically harvested right saphenous vein and left internal mammary artery;  Surgeon: [name], MD;  Location: [place] [facility];  Service: Open Heart Surgery;  Laterality: N/A;  . dialysis catheterr  2/15  . DIALYSIS/PERMA CATHETER INSERTION N/A 07/16/2019   Procedure: DIALYSIS/PERMA CATHETER INSERTION WITH VAC CHANGE UNDER SEDATION;  Surgeon: [name], MD;  Location: [place] [facility];  Service: Cardiovascular;  Laterality: N/A;  .  DIALYSIS/PERMA CATHETER INSERTION N/A 11/08/2019   Procedure: DIALYSIS/PERMA CATHETER INSERTION;  Surgeon: [name], MD;  Location: [place] [facility];  Service: Cardiovascular;  Laterality: N/A;  . DIALYSIS/PERMA CATHETER REMOVAL N/A 02/15/2019   Procedure: DIALYSIS/PERMA CATHETER REMOVAL;  Surgeon: [name], MD;  Location: [place] [facility];  Service: Cardiovascular;  Laterality: N/A;  . INSERTION OF DIALYSIS CATHETER N/A 12/14/2018   Procedure: INSERTION OF DIALYSIS CATHETER ( [place] );  Surgeon: [name], MD;  Location: [facility];  Service: Vascular;  Laterality: N/A;  . INTRAOPERATIVE TRANSESOPHAGEAL ECHOCARDIOGRAM N/A 11/29/2013   Procedure: INTRAOPERATIVE TRANSESOPHAGEAL ECHOCARDIOGRAM;  Surgeon: [name], MD;  Location: [place] [facility];  Service: Open Heart Surgery;  Laterality: N/A;  . LIGATION OF ARTERIOVENOUS  FISTULA Left 07/13/2019   Procedure: LIGATION OF ARTERIOVENOUS  FISTULA;  Surgeon: [name], MD;  Location: [facility];  Service: Vascular;  Laterality: Left;  . PERIPHERAL VASCULAR CATHETERIZATION N/A 06/12/2015   Procedure: A/V Shuntogram/Fistulagram;  Surgeon: [name], MD;  Location: [place] [facility];  Service: Cardiovascular;  Laterality: N/A;  . PERIPHERAL VASCULAR CATHETERIZATION Left 06/12/2015   Procedure: A/V Shunt Intervention;  Surgeon: [name], MD;  Location: [place] [facility];  Service: Cardiovascular;  Laterality: Left;  . RENAL BIOPSY Left 14  . REVISON OF ARTERIOVENOUS FISTULA Left 12/14/2018   Procedure: REVISON OF ARTERIOVENOUS FISTULA;  Surgeon: [name], MD;  Location: [facility];  Service: Vascular;  Laterality: Left;  . UPPER EXTREMITY VENOGRAPHY Bilateral 09/18/2019   Procedure: UPPER EXTREMITY VENOGRAPHY;  Surgeon: Katha Cabal, MD;  Location: [place] [facility];  Service: Cardiovascular;  Laterality: Bilateral;    Social History Social History   Tobacco Use  . Smoking status: Current Every Day Smoker    Packs/day: 0.50     Years: 32.00    Pack years: 16.00    Types: Cigarettes  . Smokeless tobacco: Never Used  . Tobacco comment: daily  Substance Use Topics  . Alcohol use: Yes    Alcohol/week: 7.0 standard drinks    Types: 7 Cans of beer per week    Comment: 3 BEERS PER WEEK  . Drug use: No    Family History Family History  Problem Relation Age of Onset  . Heart disease Father   . Heart disease Brother   . Healthy Sister   . Stroke Neg Hx     Allergies  Allergen Reactions  . Lisinopril Hives     REVIEW OF SYSTEMS (Negative unless checked)  Constitutional: [] Weight loss  [] Fever  [] Chills Cardiac: [] Chest pain   [] Chest pressure   [] Palpitations   [] Shortness of breath when laying flat   [] Shortness of breath with exertion. Vascular:  [] Pain in legs with walking   [] Pain in legs at rest  [] History of DVT   [] Phlebitis   [] Swelling in legs   [] Varicose veins   [] Non-healing ulcers Pulmonary:   [] Uses home oxygen   [] Productive cough   [] Hemoptysis   [] Wheeze  [] COPD   [] Asthma Neurologic:  [] Dizziness   [] Seizures   [] History of stroke   [] History of TIA  [] Aphasia   [] Vissual changes   [] Weakness or numbness in arm   [] Weakness or numbness in leg Musculoskeletal:   [] Joint swelling   [] Joint pain   [] Low back pain Hematologic:  [] Easy bruising  [] Easy bleeding   [] Hypercoagulable state   [] Anemic Gastrointestinal:  [] Diarrhea   [] Vomiting  [] Gastroesophageal reflux/heartburn   [] Difficulty swallowing. Genitourinary:  [x] Chronic kidney disease   [] Difficult urination  [] Frequent urination   [] Blood in urine Skin:  [] Rashes   [] Ulcers  Psychological:  [] History of anxiety   []  History of major depression.  Physical Examination  Vitals:   11/30/19 0940  BP: 138/88  Pulse: 63  Resp: 18  Temp: 97.8 F (36.6 C)  TempSrc: Temporal  SpO2: 100%  Weight: 69 kg  Height: 5\' 7"  (1.702 m)   Body mass index is 23.82 kg/m. Gen: WD/WN, NAD Head: [employer]/AT, No temporalis wasting.   Ear/Nose/Throat: Hearing grossly intact, nares w/o erythema or drainage Eyes: PER, EOMI, sclera nonicteric.  Neck: Supple, no large masses.   Pulmonary:  Good air movement, no audible wheezing bilaterally, no use of accessory muscles.  Cardiac: RRR, no JVD Vascular: Good size cephalic vein is  noted in the left upper arm.  Prior bruising of the elbow has now resolved. Vessel Right Left  Radial Palpable Palpable  Brachial Palpable Palpable  Carotid Palpable Palpable  Gastrointestinal: Non-distended. No guarding/no peritoneal signs.  Musculoskeletal: M/S 5/5 throughout.  No deformity or atrophy.  Neurologic: CN 2-12 intact. Symmetrical.  Speech is fluent. Motor exam as listed above. Psychiatric: Judgment intact, Mood & affect appropriate for pt's clinical situation. Dermatologic: No rashes or ulcers noted.  No changes consistent with cellulitis.   CBC Lab Results  Component Value Date   WBC 8.8 11/29/2019   HGB 9.8 (L) 11/29/2019   HCT 28.4 (L) 11/29/2019   MCV 97.9 11/29/2019   PLT 298 11/29/2019    BMET    Component Value Date/Time   NA 132 (L) 11/29/2019 0847   NA 132 (L) 10/09/2019 1433   NA 132 (L) 06/24/2014 0903   K 4.4 11/29/2019 0847   K 4.8 07/11/2014 1046   CL 95 (L) 11/29/2019 0847   CL 96 (L) 06/24/2014 0903   CO2 25 11/29/2019 0847   CO2 21 06/24/2014 0903   GLUCOSE 103 (H) 11/29/2019 0847   GLUCOSE 92 06/24/2014 0903   BUN 23 (H) 11/29/2019 0847   BUN 40 (H) 10/09/2019 1433   BUN 60 (H) 06/24/2014 0903   CREATININE 5.52 (H) 11/29/2019 0847   CREATININE 9.10 (H) 06/24/2014 0903   CALCIUM 9.1 11/29/2019 0847   CALCIUM 8.8 06/24/2014 0903   GFRNONAA 11 (L) 11/29/2019 0847   GFRNONAA 7 (L) 06/24/2014 0903   GFRNONAA 11 (L) 01/14/2014 1051   GFRAA 12 (L) 11/29/2019 0847   GFRAA 8 (L) 06/24/2014 0903   GFRAA 12 (L) 01/14/2014 1051   Estimated Creatinine Clearance: 14 mL/min (A) (by C-G formula based on SCr of 5.52 mg/dL (H)).  COAG Lab Results   Component Value Date   INR 1.1 11/29/2019   INR 1.2 09/24/2019   INR 1.5 (H) 07/13/2019    Radiology PERIPHERAL VASCULAR CATHETERIZATION  Result Date: 11/08/2019 See op note     Assessment/Plan 1. Complication of arteriovenous dialysis fistula, subsequent encounter Recommend:  At this time the patient does not have appropriate extremity access for dialysis  Patient should have a left brachial cephalic fistula created.  The risks, benefits and alternative therapies were reviewed in detail with the patient.  All questions were answered.  The patient agrees to proceed with surgery.    2. ESRD on dialysis ([place]) At the present time the patient has adequate dialysis access.  Continue hemodialysis as ordered without interruption.  Avoid nephrotoxic medications and dehydration.  Further plans per nephrology  3. Essential hypertension Continue antihypertensive medications as already ordered, these medications have been reviewed and there are no changes at this time.   4. Coronary artery disease involving native coronary artery of native heart without angina pectoris Continue cardiac and antihypertensive medications as already ordered and reviewed, no changes at this time.  Continue statin as ordered and reviewed, no changes at this time  Nitrates PRN for chest pain   5. Atrial fibrillation, chronic (HCC) Continue antiarrhythmia medications as already ordered, these medications have been reviewed and there are no changes at this time.  Continue anticoagulation as ordered by Cardiology Service  Hortencia Pilar, MD  11/30/2019 9:52 AM

## 2019-11-30 NOTE — Transfer of Care (Signed)
Immediate Anesthesia Transfer of Care Note  Patient: Daryl Weeks  Procedure(s) Performed: ARTERIOVENOUS (AV) FISTULA CREATION ( BRACHIAL CEPHALIC ) (Left )  Patient Location: PACU  Anesthesia Type:General and Regional  Level of Consciousness: drowsy  Airway & Oxygen Therapy: Patient Spontanous Breathing and Patient connected to face mask oxygen  Post-op Assessment: Report given to RN and Post -op Vital signs reviewed and stable  Post vital signs: Reviewed  Last Vitals:  Vitals Value Taken Time  BP 95/45 11/30/19 1202  Temp    Pulse 59 11/30/19 1203  Resp 11 11/30/19 1203  SpO2 100 % 11/30/19 1203  Vitals shown include unvalidated device data.  Last Pain:  Vitals:   11/30/19 0940  TempSrc: Temporal  PainSc: 0-No pain         Complications: No apparent anesthesia complications

## 2019-11-30 NOTE — Op Note (Signed)
     OPERATIVE NOTE   PROCEDURE: left brachial cephalic arteriovenous fistula placement  PRE-OPERATIVE DIAGNOSIS: End Stage Renal Disease  POST-OPERATIVE DIAGNOSIS: End Stage Renal Disease  SURGEON: Hortencia Pilar  ASSISTANT(S): Ms. Hezzie Bump  ANESTHESIA: regional  ESTIMATED BLOOD LOSS: <50 cc  FINDING(S): 4.5 mm cephalic vein  SPECIMEN(S):  none  INDICATIONS:   Daryl Weeks is a 59 y.o. male who presents with end stage renal disease.  The patient is scheduled for left brachiocephalic arteriovenous fistula placement.  The patient is aware the risks include but are not limited to: bleeding, infection, steal syndrome, nerve damage, ischemic monomelic neuropathy, failure to mature, and need for additional procedures.  The patient is aware of the risks of the procedure and elects to proceed forward.  DESCRIPTION: After full informed written consent was obtained from the patient, the patient was brought back to the operating room and placed supine upon the operating table.  Prior to induction, the patient received IV antibiotics.   After obtaining adequate anesthesia, the patient was then prepped and draped in the standard fashion for a left arm access procedure.   A first assistant was required to provide a safe and appropriate environment for executing the surgery.  The assistant was integral in providing retraction, exposure, running suture providing suction and in the closing process.   A curvilinear incision was then created midway between the radial impulse and the cephalic vein. The cephalic vein was then identified and dissected circumferentially. It was marked with a surgical marker.    Attention was then turned to the brachial artery which was exposed through the same incision and looped proximally and distally. Side branches were controlled with 4-0 silk ties.  The distal segment of the vein was ligated with a  2-0 silk, and the vein was transected.  The proximal  segment was interrogated with serial dilators.  The vein accepted up to a 4 mm dilator without any difficulty. Heparinized saline was infused into the vein and clamped it with a small bulldog.  At this point, I reset my exposure of the brachial artery and controlled the artery with vessel loops proximally and distally.  An arteriotomy was then made with a #11 blade, and extended with a Potts scissor.  Heparinized saline was injected proximal and distal into the radial artery.  The vein was then approximated to the artery while the artery was in its native bed and subsequently the vein was beveled using Potts scissors. The vein was then sewn to the artery in an end-to-side configuration with a running stitch of 6-0 Prolene.  Prior to completing this anastomosis Flushing maneuvers were performed and the artery was allowed to forward and back bleed.  There was no evidence of clot from any vessels.  I completed the anastomosis in the usual fashion and then released all vessel loops and clamps.    There was good  thrill in the venous outflow, and there was 1+ palpable radial pulse.  At this point, I irrigated out the surgical wound.  There was no further active bleeding.  The subcutaneous tissue was reapproximated with a running stitch of 3-0 Vicryl.  The skin was then reapproximated with a running subcuticular stitch of 4-0 Vicryl.  The skin was then cleaned, dried, and reinforced with Dermabond.    The patient tolerated this procedure well.   COMPLICATIONS: None  CONDITION: Daryl Weeks [facility]  Office: [phone]   11/30/2019, 11:50 AM

## 2019-12-01 DIAGNOSIS — I2510 Atherosclerotic heart disease of native coronary artery without angina pectoris: Secondary | ICD-10-CM | POA: Diagnosis not present

## 2019-12-01 DIAGNOSIS — T82898D Other specified complication of vascular prosthetic devices, implants and grafts, subsequent encounter: Secondary | ICD-10-CM | POA: Diagnosis not present

## 2019-12-01 DIAGNOSIS — N186 End stage renal disease: Secondary | ICD-10-CM | POA: Diagnosis not present

## 2019-12-01 DIAGNOSIS — N028 Recurrent and persistent hematuria with other morphologic changes: Secondary | ICD-10-CM | POA: Diagnosis not present

## 2019-12-01 DIAGNOSIS — I132 Hypertensive heart and chronic kidney disease with heart failure and with stage 5 chronic kidney disease, or end stage renal disease: Secondary | ICD-10-CM | POA: Diagnosis not present

## 2019-12-01 DIAGNOSIS — I480 Paroxysmal atrial fibrillation: Secondary | ICD-10-CM | POA: Diagnosis not present

## 2019-12-01 DIAGNOSIS — Z992 Dependence on renal dialysis: Secondary | ICD-10-CM | POA: Diagnosis not present

## 2019-12-01 DIAGNOSIS — F419 Anxiety disorder, unspecified: Secondary | ICD-10-CM | POA: Diagnosis not present

## 2019-12-01 DIAGNOSIS — D631 Anemia in chronic kidney disease: Secondary | ICD-10-CM | POA: Diagnosis not present

## 2019-12-01 DIAGNOSIS — M069 Rheumatoid arthritis, unspecified: Secondary | ICD-10-CM | POA: Diagnosis not present

## 2019-12-01 DIAGNOSIS — I5032 Chronic diastolic (congestive) heart failure: Secondary | ICD-10-CM | POA: Diagnosis not present

## 2019-12-01 DIAGNOSIS — M130 Polyarthritis, unspecified: Secondary | ICD-10-CM | POA: Diagnosis not present

## 2019-12-01 DIAGNOSIS — I129 Hypertensive chronic kidney disease with stage 1 through stage 4 chronic kidney disease, or unspecified chronic kidney disease: Secondary | ICD-10-CM | POA: Diagnosis not present

## 2019-12-01 DIAGNOSIS — F17210 Nicotine dependence, cigarettes, uncomplicated: Secondary | ICD-10-CM | POA: Diagnosis not present

## 2019-12-01 DIAGNOSIS — G4730 Sleep apnea, unspecified: Secondary | ICD-10-CM | POA: Diagnosis not present

## 2019-12-01 DIAGNOSIS — R531 Weakness: Secondary | ICD-10-CM | POA: Diagnosis not present

## 2019-12-01 DIAGNOSIS — J449 Chronic obstructive pulmonary disease, unspecified: Secondary | ICD-10-CM | POA: Diagnosis not present

## 2019-12-03 ENCOUNTER — Ambulatory Visit: Payer: Medicare Other | Admitting: Surgery

## 2019-12-03 DIAGNOSIS — N186 End stage renal disease: Secondary | ICD-10-CM | POA: Diagnosis not present

## 2019-12-03 DIAGNOSIS — Z992 Dependence on renal dialysis: Secondary | ICD-10-CM | POA: Diagnosis not present

## 2019-12-03 DIAGNOSIS — D631 Anemia in chronic kidney disease: Secondary | ICD-10-CM | POA: Diagnosis not present

## 2019-12-03 DIAGNOSIS — N2581 Secondary hyperparathyroidism of renal origin: Secondary | ICD-10-CM | POA: Diagnosis not present

## 2019-12-03 DIAGNOSIS — D509 Iron deficiency anemia, unspecified: Secondary | ICD-10-CM | POA: Diagnosis not present

## 2019-12-03 DIAGNOSIS — Z23 Encounter for immunization: Secondary | ICD-10-CM | POA: Diagnosis not present

## 2019-12-04 DIAGNOSIS — N186 End stage renal disease: Secondary | ICD-10-CM | POA: Diagnosis not present

## 2019-12-04 DIAGNOSIS — I132 Hypertensive heart and chronic kidney disease with heart failure and with stage 5 chronic kidney disease, or end stage renal disease: Secondary | ICD-10-CM | POA: Diagnosis not present

## 2019-12-04 DIAGNOSIS — T82898D Other specified complication of vascular prosthetic devices, implants and grafts, subsequent encounter: Secondary | ICD-10-CM | POA: Diagnosis not present

## 2019-12-04 DIAGNOSIS — D631 Anemia in chronic kidney disease: Secondary | ICD-10-CM | POA: Diagnosis not present

## 2019-12-04 DIAGNOSIS — I5032 Chronic diastolic (congestive) heart failure: Secondary | ICD-10-CM | POA: Diagnosis not present

## 2019-12-04 DIAGNOSIS — Z992 Dependence on renal dialysis: Secondary | ICD-10-CM | POA: Diagnosis not present

## 2019-12-05 DIAGNOSIS — Z992 Dependence on renal dialysis: Secondary | ICD-10-CM | POA: Diagnosis not present

## 2019-12-05 DIAGNOSIS — D631 Anemia in chronic kidney disease: Secondary | ICD-10-CM | POA: Diagnosis not present

## 2019-12-05 DIAGNOSIS — N186 End stage renal disease: Secondary | ICD-10-CM | POA: Diagnosis not present

## 2019-12-05 DIAGNOSIS — N2581 Secondary hyperparathyroidism of renal origin: Secondary | ICD-10-CM | POA: Diagnosis not present

## 2019-12-05 DIAGNOSIS — Z23 Encounter for immunization: Secondary | ICD-10-CM | POA: Diagnosis not present

## 2019-12-05 DIAGNOSIS — D509 Iron deficiency anemia, unspecified: Secondary | ICD-10-CM | POA: Diagnosis not present

## 2019-12-06 ENCOUNTER — Telehealth: Payer: Self-pay | Admitting: Family Medicine

## 2019-12-06 ENCOUNTER — Other Ambulatory Visit: Payer: Self-pay

## 2019-12-06 ENCOUNTER — Ambulatory Visit (INDEPENDENT_AMBULATORY_CARE_PROVIDER_SITE_OTHER): Payer: Medicare Other | Admitting: Surgery

## 2019-12-06 ENCOUNTER — Encounter: Payer: Self-pay | Admitting: Surgery

## 2019-12-06 VITALS — BP 102/62 | HR 66 | Temp 97.2°F | Ht 67.0 in | Wt 149.8 lb

## 2019-12-06 DIAGNOSIS — M1A9XX1 Chronic gout, unspecified, with tophus (tophi): Secondary | ICD-10-CM | POA: Insufficient documentation

## 2019-12-06 DIAGNOSIS — Z992 Dependence on renal dialysis: Secondary | ICD-10-CM | POA: Diagnosis not present

## 2019-12-06 DIAGNOSIS — N186 End stage renal disease: Secondary | ICD-10-CM | POA: Diagnosis not present

## 2019-12-06 DIAGNOSIS — T82898D Other specified complication of vascular prosthetic devices, implants and grafts, subsequent encounter: Secondary | ICD-10-CM | POA: Diagnosis not present

## 2019-12-06 DIAGNOSIS — I5032 Chronic diastolic (congestive) heart failure: Secondary | ICD-10-CM | POA: Diagnosis not present

## 2019-12-06 DIAGNOSIS — D631 Anemia in chronic kidney disease: Secondary | ICD-10-CM | POA: Diagnosis not present

## 2019-12-06 DIAGNOSIS — I132 Hypertensive heart and chronic kidney disease with heart failure and with stage 5 chronic kidney disease, or end stage renal disease: Secondary | ICD-10-CM | POA: Diagnosis not present

## 2019-12-06 DIAGNOSIS — I2510 Atherosclerotic heart disease of native coronary artery without angina pectoris: Secondary | ICD-10-CM

## 2019-12-06 NOTE — Progress Notes (Signed)
Patient ID: Daryl Weeks, male   DOB: 03-03-1961, 59 y.o.   MRN: [id number]  Chief Complaint: Right elbow masses present for 15 years  History of Present Illness Daryl Weeks is a 59 y.o. male with what sounds like a prior history of gouty arthritis treated some duration ago.  Current issues are as end-stage renal disease where within the last week he had a left arteriovenous fistula created.  He has a history of a right sided PermCath.  He reports his right elbow pain has somewhat increased with movement with these known to masses present that have increased in slides slightly over the last month.  He describes tenderness with pressure, and resting his elbow on arm chairs or countertops.  Otherwise he denies any drainage, inflammation or redness.  No known infection.  He is able to move his right elbow quite freely.  Past Medical History Past Medical History:  Diagnosis Date  . (HFpEF) heart failure with preserved ejection fraction (Daryl Weeks)    a. 05/2019 Echo: EF 50-55%, diast dysfxn, RVSP 56.9mmHg, Sev dil LA. Mildly dil PA.  [name] Anemia   . Anxiety   . Arthritis   . Colon polyps   . COPD (chronic obstructive pulmonary disease) (Daryl Weeks)   . Coronary artery disease    a. 10/2013 NSTEMI/Cath: Severe 3VD-->CABG x 4 @ [facility] 01/2014 (LIMA->LAD, VG->OM1->OM2, VG->RCA); b. 11/2014 MV: No isch/infarct; c. 05/2019 MV: EF 40% (50-55 by echo), small,mild, fixed apical defect - ? atten vs infarct. No ischemia->low risk.  . ESRD (end stage renal disease) (Daryl Weeks)    a. MWFSat HD  . ETOH abuse    a. 2 beers/night.  [name] GERD (gastroesophageal reflux disease)   . History of pneumonia   . Hyperlipidemia   . Hypertension   . IgA nephropathy   . Lower GI bleed    a. Due to colon polyps. Status post resection of 14 polyps  . Non-ST elevation MI (NSTEMI) (Daryl Weeks) 2016  . PAF (paroxysmal atrial fibrillation) (Daryl Weeks)    a. Dx 05/2019. CHA2DS2VASc = 3-->Eliquis/Amio.  . Pneumonia 08/2019  . Rheumatoid arthritis (Daryl Weeks)   .  Shortness of breath    WITH EXERTION  . Sleep apnea   . Tobacco abuse       Past Surgical History:  Procedure Laterality Date  . A/V FISTULAGRAM Left 10/28/2016   Procedure: A/V Fistulagram;  Surgeon: [name], MD;  Location: [place] [facility];  Service: Cardiovascular;  Laterality: Left;  . A/V FISTULAGRAM Left 12/07/2018   Procedure: A/V FISTULAGRAM;  Surgeon: [name], MD;  Location: [place] [facility];  Service: Cardiovascular;  Laterality: Left;  . A/V FISTULAGRAM N/A 06/04/2019   Procedure: A/V Fistulagram- LEFT;  Surgeon: [name], MD;  Location: [place] [facility];  Service: Cardiovascular;  Laterality: N/A;  . A/V SHUNT INTERVENTION N/A 10/28/2016   Procedure: A/V Shunt Intervention;  Surgeon: [name], MD;  Location: [place] [facility];  Service: Cardiovascular;  Laterality: N/A;  . A/V SHUNTOGRAM Left 02/01/2019   Procedure: A/V SHUNTOGRAM;  Surgeon: [name], MD;  Location: [place] [facility];  Service: Cardiovascular;  Laterality: Left;  . AV FISTULA PLACEMENT    . AV FISTULA PLACEMENT Left 11/30/2019   Procedure: ARTERIOVENOUS (AV) FISTULA CREATION ( BRACHIAL CEPHALIC );  Surgeon: Katha Cabal, MD;  Location: [facility];  Service: Vascular;  Laterality: Left;  . CARDIAC CATHETERIZATION     RCA 90% and calcified mid LAD 80%  Stenosis  . CORONARY ARTERY BYPASS GRAFT N/A 11/29/2013   Procedure: CORONARY ARTERY BYPASS GRAFTING (CABG) x 4 using endoscopically harvested right saphenous vein and left internal mammary artery;  Surgeon: [name], MD;  Location: [place] [facility];  Service: Open Heart Surgery;  Laterality: N/A;  . dialysis catheterr  2/15  . DIALYSIS/PERMA CATHETER INSERTION N/A 07/16/2019   Procedure: DIALYSIS/PERMA CATHETER INSERTION WITH VAC CHANGE UNDER SEDATION;  Surgeon: [name], MD;  Location: [place] [facility];  Service: Cardiovascular;  Laterality: N/A;  . DIALYSIS/PERMA CATHETER INSERTION N/A 11/08/2019   Procedure: DIALYSIS/PERMA  CATHETER INSERTION;  Surgeon: [name], MD;  Location: [place] [facility];  Service: Cardiovascular;  Laterality: N/A;  . DIALYSIS/PERMA CATHETER REMOVAL N/A 02/15/2019   Procedure: DIALYSIS/PERMA CATHETER REMOVAL;  Surgeon: [name], MD;  Location: [place] [facility];  Service: Cardiovascular;  Laterality: N/A;  . INSERTION OF DIALYSIS CATHETER N/A 12/14/2018   Procedure: INSERTION OF DIALYSIS CATHETER ( [place] );  Surgeon: [name], MD;  Location: [facility];  Service: Vascular;  Laterality: N/A;  . INTRAOPERATIVE TRANSESOPHAGEAL ECHOCARDIOGRAM N/A 11/29/2013   Procedure: INTRAOPERATIVE TRANSESOPHAGEAL ECHOCARDIOGRAM;  Surgeon: [name], MD;  Location: [place] [facility];  Service: Open Heart Surgery;  Laterality: N/A;  . LIGATION OF ARTERIOVENOUS  FISTULA Left 07/13/2019   Procedure: LIGATION OF ARTERIOVENOUS  FISTULA;  Surgeon: [name], MD;  Location: [facility];  Service: Vascular;  Laterality: Left;  . PERIPHERAL VASCULAR CATHETERIZATION N/A 06/12/2015   Procedure: A/V Shuntogram/Fistulagram;  Surgeon: [name], MD;  Location: [place] [facility];  Service: Cardiovascular;  Laterality: N/A;  . PERIPHERAL VASCULAR CATHETERIZATION Left 06/12/2015   Procedure: A/V Shunt Intervention;  Surgeon: [name], MD;  Location: [place] [facility];  Service: Cardiovascular;  Laterality: Left;  . RENAL BIOPSY Left 14  . REVISON OF ARTERIOVENOUS FISTULA Left 12/14/2018   Procedure: REVISON OF ARTERIOVENOUS FISTULA;  Surgeon: [name], MD;  Location: [facility];  Service: Vascular;  Laterality: Left;  . UPPER EXTREMITY VENOGRAPHY Bilateral 09/18/2019   Procedure: UPPER EXTREMITY VENOGRAPHY;  Surgeon: Katha Cabal, MD;  Location: [place] [facility];  Service: Cardiovascular;  Laterality: Bilateral;    Allergies  Allergen Reactions  . Lisinopril Hives    Current Outpatient Medications  Medication Sig Dispense Refill  . amiodarone (PACERONE) 200 MG tablet Take 200 mg by mouth every  morning.     [name] amLODipine (NORVASC) 10 MG tablet Take 10 mg by mouth at bedtime.    [name] apixaban (ELIQUIS) 2.5 MG TABS tablet Take 2.5 mg by mouth daily.    [name] aspirin EC 81 MG tablet Take 1 tablet (81 mg total) by mouth daily. 90 tablet 3  . atorvastatin (LIPITOR) 20 MG tablet take 1 tablet by mouth at bedtime (Patient taking differently: Take 20 mg by mouth every morning. ) 30 tablet 6  . AURYXIA 1 GM 210 MG(Fe) tablet Take 420 mg by mouth 3 (three) times daily with meals.   11  . calcium carbonate (TUMS - DOSED IN MG ELEMENTAL CALCIUM) 500 MG chewable tablet Chew 1 tablet by mouth daily as needed for indigestion or heartburn.     . carvedilol (COREG) 12.5 MG tablet Take 1 tablet (12.5 mg total) by mouth 2 (two) times daily with a meal. 180 tablet 3  . cinacalcet (SENSIPAR) 30 MG tablet Take 30 mg by mouth daily with supper.     . cloNIDine (CATAPRES) 0.2 MG  tablet Take 1 tablet (0.2 mg total) by mouth 3 (three) times daily. 90 tablet 0  . docusate sodium (COLACE) 100 MG capsule Take 1 capsule (100 mg total) by mouth 2 (two) times daily as needed for mild constipation. 10 capsule 0  . Fluticasone Propionate, Inhal, (FLOVENT DISKUS) 100 MCG/BLIST AEPB Inhale 1 puff into the lungs 2 (two) times daily. (Patient taking differently: Inhale 1 puff into the lungs every morning. ) 60 each 3  . Fluticasone-Umeclidin-Vilant (TRELEGY ELLIPTA) 100-62.5-25 MCG/INH AEPB Inhale 1 puff into the lungs daily. (Patient taking differently: Inhale 1 puff into the lungs every other day. ) 60 each 5  . furosemide (LASIX) 80 MG tablet Take 80 mg by mouth 2 (two) times daily.     [name] gabapentin (NEURONTIN) 100 MG capsule Take 100 mg by mouth 3 (three) times daily.   11  . hydrALAZINE (APRESOLINE) 100 MG tablet Take 50 mg by mouth 3 (three) times daily.     [name] HYDROcodone-acetaminophen (NORCO) 5-325 MG tablet Take 1-2 tablets by mouth every 6 (six) hours as needed for moderate pain or severe pain. 25 tablet 0  . loratadine  (CLARITIN) 10 MG tablet Take 1 tablet (10 mg total) by mouth daily. (Patient taking differently: Take 10 mg by mouth every morning. ) 30 tablet 0  . ondansetron (ZOFRAN-ODT) 4 MG disintegrating tablet Take 4 mg by mouth every 8 (eight) hours as needed for nausea.     [name] SPIRIVA HANDIHALER 18 MCG inhalation capsule 1 capsule daily.    [name] umeclidinium-vilanterol (ANORO ELLIPTA) 62.5-25 MCG/INH AEPB Inhale 1 puff into the lungs daily. 14 each 3  . zolpidem (AMBIEN) 10 MG tablet Take 10 mg by mouth at bedtime as needed for sleep.     Current Facility-Administered Medications  Medication Dose Route Frequency Provider Last Rate Last Admin  . budesonide (PULMICORT) 180 MCG/ACT inhaler 1 puff  1 puff Inhalation BID Tyler Pita, MD        Family History Family History  Problem Relation Age of Onset  . Heart disease Father   . Heart disease Brother   . Healthy Sister   . Stroke Neg Hx       Social History Social History   Tobacco Use  . Smoking status: Current Every Day Smoker    Packs/day: 0.50    Years: 32.00    Pack years: 16.00    Types: Cigarettes  . Smokeless tobacco: Never Used  . Tobacco comment: daily  Substance Use Topics  . Alcohol use: Yes    Alcohol/week: 7.0 standard drinks    Types: 7 Cans of beer per week    Comment: 3 BEERS PER WEEK  . Drug use: No        Review of Systems  Constitutional: Negative for chills and fever.  HENT: Positive for sinus pain.   Eyes: Positive for blurred vision.  Respiratory: Negative.   Cardiovascular: Negative.   Gastrointestinal: Negative.   Genitourinary: Positive for dysuria.  Musculoskeletal: Positive for joint pain.  Skin: Negative.   Neurological: Negative.   Endo/Heme/Allergies: Bruises/bleeds easily.      Physical Exam Blood pressure 102/62, pulse 66, temperature (!) 97.2 F (36.2 C), temperature source Temporal, height 5\' 7"  (1.702 m), weight 149 lb 12.8 oz (67.9 kg), SpO2 92 %. Last Weight  Most recent  update: 12/06/2019 11:04 AM   Weight  67.9 kg (149 lb 12.8 oz)            CONSTITUTIONAL: Chronically ill-appearing  male appearing much older than his stated age. EYES: Sclera non-icteric.   EARS, NOSE, MOUTH AND THROAT: Mask worn.   Hearing is intact to voice.  NECK: Trachea is midline, and there is no jugular venous distension.  LYMPH NODES:  Lymph nodes in the neck are not enlarged. RESPIRATORY:  Lungs are clear, and breath sounds are equal bilaterally. Normal respiratory effort without pathologic use of accessory muscles.  CARDIOVASCULAR: Heart is regular in rate and rhythm. GI: The abdomen is soft, nontender, and nondistended.  MUSCULOSKELETAL:  Symmetrical muscle tone appreciated in all four extremities.    2 masses adjacent to right olecranon, both mobile the more proximal is 1.5 cm in size and an adjacent distal 1 is closer to 3 cm in size.  He refers to similar mass on his left elbow.  Neither has overlying dermal changes, ulcerations or inflammatory changes.  Neither is particularly tender to palpation. SKIN: Skin turgor is normal.  Healing scar left forearm, recent AV fistula creation.  Right chest wall area from recent PermCath. NEUROLOGIC:  Motor and sensation appear grossly normal.  Cranial nerves are grossly without defect. PSYCH:  Alert and oriented to person, place and time. Affect is appropriate for situation.  Data Reviewed I have personally reviewed what is currently available of the patient's imaging, recent labs and medical records.   Labs:  CBC Latest Ref Rng & Units 11/30/2019 11/29/2019 10/09/2019  WBC 4.0 - 10.5 K/uL - 8.8 12.0(H)  Hemoglobin 13.0 - 17.0 g/dL 11.9(L) 9.8(L) 12.0(L)  Hematocrit 39.0 - 52.0 % 35.0(L) 28.4(L) 35.3(L)  Platelets 150 - 400 K/uL - 298 330   CMP Latest Ref Rng & Units 11/30/2019 11/29/2019 10/10/2019  Glucose 70 - 99 mg/dL 88 103(H) -  BUN 6 - 20 mg/dL 18 23(H) -  Creatinine 0.61 - 1.24 mg/dL 5.80(H) 5.52(H) -  Sodium 135 - 145 mmol/L  130(L) 132(L) -  Potassium 3.5 - 5.1 mmol/L 4.0 4.4 4.3  Chloride 98 - 111 mmol/L 96(L) 95(L) -  CO2 22 - 32 mmol/L - 25 -  Calcium 8.9 - 10.3 mg/dL - 9.1 -  Total Protein 6.0 - 8.5 g/dL - - -  Total Bilirubin 0.0 - 1.2 mg/dL - - -  Alkaline Phos 39 - 117 IU/L - - -  AST 0 - 40 IU/L - - -  ALT 0 - 44 IU/L - - -      Imaging:  Within last 24 hrs: No results found.  Assessment    I suspect these are tophi from gout, and I am reluctant to consider removal with the expectation that this will resolve any gouty arthritic changes.  It is possible an orthopedic surgeon may be better qualified operating around the elbow.  These are likely benign, and are simply manifestations of longstanding disease.  I am concerned about the present state of controlling his uric acid levels and have discussed this with his referring provider. Patient Active Problem List   Diagnosis Date Noted  . Cough with hemoptysis 09/24/2019  . Atrial fibrillation, chronic ([place]) 09/24/2019  . Community acquired pneumonia 09/24/2019  . Hemoptysis 09/24/2019  . Complication of AV dialysis fistula 07/13/2019  . Left lower lobe pneumonia 05/28/2019  . Complication of vascular access for dialysis 11/28/2018  . COPD (chronic obstructive pulmonary disease) ([place]) 12/05/2017  . ESRD on dialysis ([place]) 10/05/2016  . IgA nephropathy 11/05/2014  . S/P CABG x 4 11/29/2013  . Chronic diastolic heart failure ([place]) 11/16/2013  . Coronary artery disease   .  Hyperlipidemia   . Hypertensive disorder   . Dyspnea 11/20/2012  . Tobacco abuse     Plan    We will reevaluate his treatment of gout in light of consideration of removal of a mass to assist with right elbow pain.  Removing these tophi may not be as helpful as the patient believes.  I will be glad to see him back after the above has been addressed.  Face-to-face time spent with the patient and accompanying care providers(if present) was 30 minutes, with more than 50% of the  time spent counseling, educating, and coordinating care of the patient.      Ronny Bacon M.D., FACS 12/06/2019, 11:37 AM

## 2019-12-06 NOTE — Patient Instructions (Addendum)
Dr.Rodenberg discussed with patient he would like to discuss with patient's Primary Care Physician about Gout treatment for patient before moving forward with surgical treatment. Dr.Rodenberg will follow up afterwards.  Gout  Gout is painful swelling of your joints. Gout is a type of arthritis. It is caused by having too much uric acid in your body. Uric acid is a chemical that is made when your body breaks down substances called purines. If your body has too much uric acid, sharp crystals can form and build up in your joints. This causes pain and swelling. Gout attacks can happen quickly and be very painful (acute gout). Over time, the attacks can affect more joints and happen more often (chronic gout). What are the causes?  Too much uric acid in your blood. This can happen because: ? Your kidneys do not remove enough uric acid from your blood. ? Your body makes too much uric acid. ? You eat too many foods that are high in purines. These foods include organ meats, some seafood, and beer.  Trauma or stress. What increases the risk?  Having a family history of gout.  Being male and middle-aged.  Being male and having gone through menopause.  Being very overweight (obese).  Drinking alcohol, especially beer.  Not having enough water in the body (being dehydrated).  Losing weight too quickly.  Having an organ transplant.  Having lead poisoning.  Taking certain medicines.  Having kidney disease.  Having a skin condition called psoriasis. What are the signs or symptoms? An attack of acute gout usually happens in just one joint. The most common place is the big toe. Attacks often start at night. Other joints that may be affected include joints of the feet, ankle, knee, fingers, wrist, or elbow. Symptoms of an attack may include:  Very bad pain.  Warmth.  Swelling.  Stiffness.  Shiny, red, or purple skin.  Tenderness. The affected joint may be very painful to  touch.  Chills and fever. Chronic gout may cause symptoms more often. More joints may be involved. You may also have white or yellow lumps (tophi) on your hands or feet or in other areas near your joints. How is this treated?  Treatment for this condition has two phases: treating an acute attack and preventing future attacks.  Acute gout treatment may include: ? NSAIDs. ? Steroids. These are taken by mouth or injected into a joint. ? Colchicine. This medicine relieves pain and swelling. It can be given by mouth or through an IV tube.  Preventive treatment may include: ? Taking small doses of NSAIDs or colchicine daily. ? Using a medicine that reduces uric acid levels in your blood. ? Making changes to your diet. You may need to see a food expert (dietitian) about what to eat and drink to prevent gout. Follow these instructions at home: During a gout attack   If told, put ice on the painful area: ? Put ice in a plastic bag. ? Place a towel between your skin and the bag. ? Leave the ice on for 20 minutes, 2-3 times a day.  Raise (elevate) the painful joint above the level of your heart as often as you can.  Rest the joint as much as possible. If the joint is in your leg, you may be given crutches.  Follow instructions from your doctor about what you cannot eat or drink. Avoiding future gout attacks  Eat a low-purine diet. Avoid foods and drinks such as: ? Liver. ? Kidney. ?  Anchovies. ? Asparagus. ? Herring. ? Mushrooms. ? Mussels. ? Beer.  Stay at a healthy weight. If you want to lose weight, talk with your doctor. Do not lose weight too fast.  Start or continue an exercise plan as told by your doctor. Eating and drinking  Drink enough fluids to keep your pee (urine) pale yellow.  If you drink alcohol: ? Limit how much you use to:  0-1 drink a day for women.  0-2 drinks a day for men. ? Be aware of how much alcohol is in your drink. In the [place] one drink equals  one 12 oz bottle of beer (355 mL), one 5 oz glass of wine (148 mL), or one 1 oz glass of hard liquor (44 mL). General instructions  Take over-the-counter and prescription medicines only as told by your doctor.  Do not drive or use heavy machinery while taking prescription pain medicine.  Return to your normal activities as told by your doctor. Ask your doctor what activities are safe for you.  Keep all follow-up visits as told by your doctor. This is important. Contact a doctor if:  You have another gout attack.  You still have symptoms of a gout attack after 10 days of treatment.  You have problems (side effects) because of your medicines.  You have chills or a fever.  You have burning pain when you pee (urinate).  You have pain in your lower back or belly. Get help right away if:  You have very bad pain.  Your pain cannot be controlled.  You cannot pee. Summary  Gout is painful swelling of the joints.  The most common site of pain is the big toe, but it can affect other joints.  Medicines and avoiding some foods can help to prevent and treat gout attacks. This information is not intended to replace advice given to you by your health care provider. Make sure you discuss any questions you have with your health care provider. Document Revised: 03/22/2018 Document Reviewed: 03/22/2018 Elsevier Patient Education  [place].

## 2019-12-06 NOTE — Telephone Encounter (Signed)
Patient was seen recently by me on 11/27/19 for a R forearm mass/cyst-like structure for 10-15 years   He was referred to General Surgery for further evaluation given pain from this problem and consideration of excision or removal by his request.  He has history of ESRD on HD, he is s/p kidney transplant.  Gen Surgery Dr Christian Mate saw him today [facility] and was concerned about possible Gout causing this as a potential tophi complication.  His last uric acid was 5.3 in 2014.  I called patient's Nephrology - [place] he is followed by Dr Nolon Lennert or Duard Brady for HD currently, and I spoke with a triage nurse about these concerns, and wanted to notify them of our concern for possible gout to get their input.  Waiting on response back from Nephrology. Ultimately, he will likely need a Uric Acid test and consultation to determine if it is tophaceous gout, how to properly treat uric acid in setting of ESRD on HD.  Nobie Putnam, [facility] 12/06/2019, 11:54 AM

## 2019-12-07 DIAGNOSIS — Z992 Dependence on renal dialysis: Secondary | ICD-10-CM | POA: Diagnosis not present

## 2019-12-07 DIAGNOSIS — Z23 Encounter for immunization: Secondary | ICD-10-CM | POA: Diagnosis not present

## 2019-12-07 DIAGNOSIS — D509 Iron deficiency anemia, unspecified: Secondary | ICD-10-CM | POA: Diagnosis not present

## 2019-12-07 DIAGNOSIS — N2581 Secondary hyperparathyroidism of renal origin: Secondary | ICD-10-CM | POA: Diagnosis not present

## 2019-12-07 DIAGNOSIS — N186 End stage renal disease: Secondary | ICD-10-CM | POA: Diagnosis not present

## 2019-12-07 DIAGNOSIS — D631 Anemia in chronic kidney disease: Secondary | ICD-10-CM | POA: Diagnosis not present

## 2019-12-10 DIAGNOSIS — N2581 Secondary hyperparathyroidism of renal origin: Secondary | ICD-10-CM | POA: Diagnosis not present

## 2019-12-10 DIAGNOSIS — Z23 Encounter for immunization: Secondary | ICD-10-CM | POA: Diagnosis not present

## 2019-12-10 DIAGNOSIS — N186 End stage renal disease: Secondary | ICD-10-CM | POA: Diagnosis not present

## 2019-12-10 DIAGNOSIS — D631 Anemia in chronic kidney disease: Secondary | ICD-10-CM | POA: Diagnosis not present

## 2019-12-10 DIAGNOSIS — D509 Iron deficiency anemia, unspecified: Secondary | ICD-10-CM | POA: Diagnosis not present

## 2019-12-10 DIAGNOSIS — Z992 Dependence on renal dialysis: Secondary | ICD-10-CM | POA: Diagnosis not present

## 2019-12-11 DIAGNOSIS — E8779 Other fluid overload: Secondary | ICD-10-CM | POA: Diagnosis not present

## 2019-12-11 DIAGNOSIS — N186 End stage renal disease: Secondary | ICD-10-CM | POA: Diagnosis not present

## 2019-12-11 DIAGNOSIS — Z9111 Patient's noncompliance with dietary regimen: Secondary | ICD-10-CM | POA: Diagnosis not present

## 2019-12-11 DIAGNOSIS — I132 Hypertensive heart and chronic kidney disease with heart failure and with stage 5 chronic kidney disease, or end stage renal disease: Secondary | ICD-10-CM | POA: Diagnosis not present

## 2019-12-11 DIAGNOSIS — I5032 Chronic diastolic (congestive) heart failure: Secondary | ICD-10-CM | POA: Diagnosis not present

## 2019-12-11 DIAGNOSIS — Z992 Dependence on renal dialysis: Secondary | ICD-10-CM | POA: Diagnosis not present

## 2019-12-11 DIAGNOSIS — T82898D Other specified complication of vascular prosthetic devices, implants and grafts, subsequent encounter: Secondary | ICD-10-CM | POA: Diagnosis not present

## 2019-12-11 DIAGNOSIS — D631 Anemia in chronic kidney disease: Secondary | ICD-10-CM | POA: Diagnosis not present

## 2019-12-12 DIAGNOSIS — N186 End stage renal disease: Secondary | ICD-10-CM | POA: Diagnosis not present

## 2019-12-12 DIAGNOSIS — N2581 Secondary hyperparathyroidism of renal origin: Secondary | ICD-10-CM | POA: Diagnosis not present

## 2019-12-12 DIAGNOSIS — D509 Iron deficiency anemia, unspecified: Secondary | ICD-10-CM | POA: Diagnosis not present

## 2019-12-12 DIAGNOSIS — Z992 Dependence on renal dialysis: Secondary | ICD-10-CM | POA: Diagnosis not present

## 2019-12-12 DIAGNOSIS — D631 Anemia in chronic kidney disease: Secondary | ICD-10-CM | POA: Diagnosis not present

## 2019-12-12 DIAGNOSIS — Z23 Encounter for immunization: Secondary | ICD-10-CM | POA: Diagnosis not present

## 2019-12-13 ENCOUNTER — Telehealth (INDEPENDENT_AMBULATORY_CARE_PROVIDER_SITE_OTHER): Payer: Self-pay

## 2019-12-13 DIAGNOSIS — D631 Anemia in chronic kidney disease: Secondary | ICD-10-CM | POA: Diagnosis not present

## 2019-12-13 DIAGNOSIS — Z992 Dependence on renal dialysis: Secondary | ICD-10-CM | POA: Diagnosis not present

## 2019-12-13 DIAGNOSIS — I5032 Chronic diastolic (congestive) heart failure: Secondary | ICD-10-CM | POA: Diagnosis not present

## 2019-12-13 DIAGNOSIS — N2581 Secondary hyperparathyroidism of renal origin: Secondary | ICD-10-CM | POA: Diagnosis not present

## 2019-12-13 DIAGNOSIS — T82898D Other specified complication of vascular prosthetic devices, implants and grafts, subsequent encounter: Secondary | ICD-10-CM | POA: Diagnosis not present

## 2019-12-13 DIAGNOSIS — D509 Iron deficiency anemia, unspecified: Secondary | ICD-10-CM | POA: Diagnosis not present

## 2019-12-13 DIAGNOSIS — Z23 Encounter for immunization: Secondary | ICD-10-CM | POA: Diagnosis not present

## 2019-12-13 DIAGNOSIS — I132 Hypertensive heart and chronic kidney disease with heart failure and with stage 5 chronic kidney disease, or end stage renal disease: Secondary | ICD-10-CM | POA: Diagnosis not present

## 2019-12-13 DIAGNOSIS — N186 End stage renal disease: Secondary | ICD-10-CM | POA: Diagnosis not present

## 2019-12-13 NOTE — Telephone Encounter (Signed)
Lovey Newcomer called requesting verbal orders to move speech therapy evaluation to next week due not being able reach the patient. I informed Lovey Newcomer that verbal orders are to fine

## 2019-12-14 DIAGNOSIS — Z992 Dependence on renal dialysis: Secondary | ICD-10-CM | POA: Diagnosis not present

## 2019-12-14 DIAGNOSIS — N186 End stage renal disease: Secondary | ICD-10-CM | POA: Diagnosis not present

## 2019-12-14 DIAGNOSIS — Z23 Encounter for immunization: Secondary | ICD-10-CM | POA: Diagnosis not present

## 2019-12-14 DIAGNOSIS — D631 Anemia in chronic kidney disease: Secondary | ICD-10-CM | POA: Diagnosis not present

## 2019-12-14 DIAGNOSIS — N2581 Secondary hyperparathyroidism of renal origin: Secondary | ICD-10-CM | POA: Diagnosis not present

## 2019-12-14 DIAGNOSIS — D509 Iron deficiency anemia, unspecified: Secondary | ICD-10-CM | POA: Diagnosis not present

## 2019-12-17 DIAGNOSIS — Z992 Dependence on renal dialysis: Secondary | ICD-10-CM | POA: Diagnosis not present

## 2019-12-17 DIAGNOSIS — D509 Iron deficiency anemia, unspecified: Secondary | ICD-10-CM | POA: Diagnosis not present

## 2019-12-17 DIAGNOSIS — N186 End stage renal disease: Secondary | ICD-10-CM | POA: Diagnosis not present

## 2019-12-17 DIAGNOSIS — N2581 Secondary hyperparathyroidism of renal origin: Secondary | ICD-10-CM | POA: Diagnosis not present

## 2019-12-17 DIAGNOSIS — D631 Anemia in chronic kidney disease: Secondary | ICD-10-CM | POA: Diagnosis not present

## 2019-12-17 DIAGNOSIS — Z23 Encounter for immunization: Secondary | ICD-10-CM | POA: Diagnosis not present

## 2019-12-18 ENCOUNTER — Emergency Department: Payer: Medicare Other

## 2019-12-18 ENCOUNTER — Telehealth: Payer: Self-pay | Admitting: Family Medicine

## 2019-12-18 ENCOUNTER — Encounter: Payer: Self-pay | Admitting: Emergency Medicine

## 2019-12-18 ENCOUNTER — Inpatient Hospital Stay
Admission: EM | Admit: 2019-12-18 | Discharge: 2019-12-20 | DRG: 312 | Disposition: A | Discharge status: Home Health | Payer: Medicare Other | Attending: Internal Medicine | Admitting: Internal Medicine

## 2019-12-18 DIAGNOSIS — I5032 Chronic diastolic (congestive) heart failure: Secondary | ICD-10-CM | POA: Diagnosis present

## 2019-12-18 DIAGNOSIS — G4730 Sleep apnea, unspecified: Secondary | ICD-10-CM

## 2019-12-18 DIAGNOSIS — Z6823 Body mass index (BMI) 23.0-23.9, adult: Secondary | ICD-10-CM

## 2019-12-18 DIAGNOSIS — Z951 Presence of aortocoronary bypass graft: Secondary | ICD-10-CM

## 2019-12-18 DIAGNOSIS — I951 Orthostatic hypotension: Principal | ICD-10-CM

## 2019-12-18 DIAGNOSIS — Z888 Allergy status to other drugs, medicaments and biological substances status: Secondary | ICD-10-CM

## 2019-12-18 DIAGNOSIS — I69322 Dysarthria following cerebral infarction: Secondary | ICD-10-CM

## 2019-12-18 DIAGNOSIS — J449 Chronic obstructive pulmonary disease, unspecified: Secondary | ICD-10-CM | POA: Diagnosis present

## 2019-12-18 DIAGNOSIS — N186 End stage renal disease: Secondary | ICD-10-CM

## 2019-12-18 DIAGNOSIS — Z7901 Long term (current) use of anticoagulants: Secondary | ICD-10-CM

## 2019-12-18 DIAGNOSIS — I959 Hypotension, unspecified: Secondary | ICD-10-CM | POA: Diagnosis present

## 2019-12-18 DIAGNOSIS — R55 Syncope and collapse: Secondary | ICD-10-CM | POA: Diagnosis not present

## 2019-12-18 DIAGNOSIS — E43 Unspecified severe protein-calorie malnutrition: Secondary | ICD-10-CM | POA: Insufficient documentation

## 2019-12-18 DIAGNOSIS — W19XXXA Unspecified fall, initial encounter: Secondary | ICD-10-CM | POA: Diagnosis not present

## 2019-12-18 DIAGNOSIS — F1010 Alcohol abuse, uncomplicated: Secondary | ICD-10-CM | POA: Diagnosis present

## 2019-12-18 DIAGNOSIS — D631 Anemia in chronic kidney disease: Secondary | ICD-10-CM | POA: Diagnosis not present

## 2019-12-18 DIAGNOSIS — Z8249 Family history of ischemic heart disease and other diseases of the circulatory system: Secondary | ICD-10-CM

## 2019-12-18 DIAGNOSIS — G4733 Obstructive sleep apnea (adult) (pediatric): Secondary | ICD-10-CM | POA: Diagnosis present

## 2019-12-18 DIAGNOSIS — W1839XA Other fall on same level, initial encounter: Secondary | ICD-10-CM | POA: Diagnosis present

## 2019-12-18 DIAGNOSIS — I252 Old myocardial infarction: Secondary | ICD-10-CM

## 2019-12-18 DIAGNOSIS — I132 Hypertensive heart and chronic kidney disease with heart failure and with stage 5 chronic kidney disease, or end stage renal disease: Secondary | ICD-10-CM | POA: Diagnosis not present

## 2019-12-18 DIAGNOSIS — E785 Hyperlipidemia, unspecified: Secondary | ICD-10-CM | POA: Diagnosis present

## 2019-12-18 DIAGNOSIS — F17210 Nicotine dependence, cigarettes, uncomplicated: Secondary | ICD-10-CM | POA: Diagnosis present

## 2019-12-18 DIAGNOSIS — N028 Recurrent and persistent hematuria with other morphologic changes: Secondary | ICD-10-CM | POA: Diagnosis present

## 2019-12-18 DIAGNOSIS — Z79899 Other long term (current) drug therapy: Secondary | ICD-10-CM

## 2019-12-18 DIAGNOSIS — Z20822 Contact with and (suspected) exposure to covid-19: Secondary | ICD-10-CM | POA: Diagnosis present

## 2019-12-18 DIAGNOSIS — I10 Essential (primary) hypertension: Secondary | ICD-10-CM | POA: Diagnosis present

## 2019-12-18 DIAGNOSIS — T82898D Other specified complication of vascular prosthetic devices, implants and grafts, subsequent encounter: Secondary | ICD-10-CM | POA: Diagnosis not present

## 2019-12-18 DIAGNOSIS — M069 Rheumatoid arthritis, unspecified: Secondary | ICD-10-CM | POA: Diagnosis present

## 2019-12-18 DIAGNOSIS — I120 Hypertensive chronic kidney disease with stage 5 chronic kidney disease or end stage renal disease: Secondary | ICD-10-CM | POA: Diagnosis not present

## 2019-12-18 DIAGNOSIS — Z992 Dependence on renal dialysis: Secondary | ICD-10-CM | POA: Diagnosis not present

## 2019-12-18 DIAGNOSIS — J441 Chronic obstructive pulmonary disease with (acute) exacerbation: Secondary | ICD-10-CM | POA: Diagnosis present

## 2019-12-18 DIAGNOSIS — Z79891 Long term (current) use of opiate analgesic: Secondary | ICD-10-CM

## 2019-12-18 DIAGNOSIS — Z7951 Long term (current) use of inhaled steroids: Secondary | ICD-10-CM

## 2019-12-18 DIAGNOSIS — K219 Gastro-esophageal reflux disease without esophagitis: Secondary | ICD-10-CM | POA: Diagnosis present

## 2019-12-18 DIAGNOSIS — Z7982 Long term (current) use of aspirin: Secondary | ICD-10-CM

## 2019-12-18 DIAGNOSIS — I480 Paroxysmal atrial fibrillation: Secondary | ICD-10-CM

## 2019-12-18 DIAGNOSIS — N2581 Secondary hyperparathyroidism of renal origin: Secondary | ICD-10-CM | POA: Diagnosis present

## 2019-12-18 DIAGNOSIS — Z86010 Personal history of colonic polyps: Secondary | ICD-10-CM

## 2019-12-18 DIAGNOSIS — I2510 Atherosclerotic heart disease of native coronary artery without angina pectoris: Secondary | ICD-10-CM | POA: Diagnosis present

## 2019-12-18 DIAGNOSIS — F419 Anxiety disorder, unspecified: Secondary | ICD-10-CM | POA: Diagnosis present

## 2019-12-18 DIAGNOSIS — M1990 Unspecified osteoarthritis, unspecified site: Secondary | ICD-10-CM | POA: Diagnosis present

## 2019-12-18 DIAGNOSIS — S0990XA Unspecified injury of head, initial encounter: Secondary | ICD-10-CM | POA: Diagnosis not present

## 2019-12-18 DIAGNOSIS — Y92007 Garden or yard of unspecified non-institutional (private) residence as the place of occurrence of the external cause: Secondary | ICD-10-CM

## 2019-12-18 LAB — CBC
HCT: 27.4 % — ABNORMAL LOW (ref 39.0–52.0)
Hemoglobin: 9.2 g/dL — ABNORMAL LOW (ref 13.0–17.0)
MCH: 35.9 pg — ABNORMAL HIGH (ref 26.0–34.0)
MCHC: 33.6 g/dL (ref 30.0–36.0)
MCV: 107 fL — ABNORMAL HIGH (ref 80.0–100.0)
Platelets: 302 10*3/uL (ref 150–400)
RBC: 2.56 MIL/uL — ABNORMAL LOW (ref 4.22–5.81)
RDW: 18.7 % — ABNORMAL HIGH (ref 11.5–15.5)
WBC: 9 10*3/uL (ref 4.0–10.5)
nRBC: 0 % (ref 0.0–0.2)

## 2019-12-18 LAB — BASIC METABOLIC PANEL
Anion gap: 12 (ref 5–15)
BUN: 27 mg/dL — ABNORMAL HIGH (ref 6–20)
CO2: 25 mmol/L (ref 22–32)
Calcium: 9 mg/dL (ref 8.9–10.3)
Chloride: 97 mmol/L — ABNORMAL LOW (ref 98–111)
Creatinine, Ser: 6.31 mg/dL — ABNORMAL HIGH (ref 0.61–1.24)
GFR calc Af Amer: 10 mL/min — ABNORMAL LOW (ref >60)
GFR calc non Af Amer: 9 mL/min — ABNORMAL LOW (ref >60)
Glucose, Bld: 104 mg/dL — ABNORMAL HIGH (ref 70–99)
Potassium: 3.8 mmol/L (ref 3.5–5.1)
Sodium: 134 mmol/L — ABNORMAL LOW (ref 135–145)

## 2019-12-18 LAB — TROPONIN I (HIGH SENSITIVITY)
Troponin I (High Sensitivity): 48 ng/L — ABNORMAL HIGH (ref <18)
Troponin I (High Sensitivity): 52 ng/L — ABNORMAL HIGH (ref <18)

## 2019-12-18 NOTE — Telephone Encounter (Signed)
Is there a form for this that I need to fill out or a certain vendor that it needs to be sent to for DME?  Thanks

## 2019-12-18 NOTE — Telephone Encounter (Signed)
The pt would need to be seen in the office for the issue with in 90 days for medicare to cover the wheel chair. It just needs to be written on a prescription.

## 2019-12-18 NOTE — Telephone Encounter (Signed)
Mark with  [facility] is requesting a roller Wheel chair pt have fallen several times. Mark call back # is  [phone]

## 2019-12-18 NOTE — ED Triage Notes (Signed)
Pt arrived via EMS from home where he had syncopal episode outside of home in yard. Pt denies hitting head. Pt has swelling to the right elbow but was diagnosed with gout in elbow over last week. Pt had recent stroke and is currently receiving speech therapy. Pt had dialysis on Monday.

## 2019-12-18 NOTE — ED Provider Notes (Signed)
[facility] Emergency Department Provider Note   ____________________________________________   First MD Initiated Contact with Patient 12/18/19 2310     (approximate)  I have reviewed the triage vital signs and the nursing notes.   HISTORY  Chief Complaint Loss of Consciousness  Level V caveat: Limited by poor historian  HPI Daryl Weeks is a 59 y.o. male brought to the ED via EMS from home status post syncopal episode.  History of ESRD on HD M/W/F; had full dialysis on Monday.  Patient reports he had been outdoors mowing his yard on a riding lawnmower.  Had a syncopal episode but denies striking his head.  Unclear how long his syncope lasted and who found him.  Denies headache, vision changes, neck pain, chest pain, shortness of breath, abdominal pain, nausea, vomiting or diarrhea.  Most recently seen by his PCP and general surgery for swelling to his right elbow, ?  gout.       Past Medical History:  Diagnosis Date  . (HFpEF) heart failure with preserved ejection fraction ([place])    a. 05/2019 Echo: EF 50-55%, diast dysfxn, RVSP 56.61mmHg, Sev dil LA. Mildly dil PA.  [name] Anemia   . Anxiety   . Arthritis   . Colon polyps   . COPD (chronic obstructive pulmonary disease) ([place])   . Coronary artery disease    a. 10/2013 NSTEMI/Cath: Severe 3VD-->CABG x 4 @ [facility] 01/2014 (LIMA->LAD, VG->OM1->OM2, VG->RCA); b. 11/2014 MV: No isch/infarct; c. 05/2019 MV: EF 40% (50-55 by echo), small,mild, fixed apical defect - ? atten vs infarct. No ischemia->low risk.  . ESRD (end stage renal disease) ([place])    a. MWFSat HD  . ETOH abuse    a. 2 beers/night.  [name] GERD (gastroesophageal reflux disease)   . History of pneumonia   . Hyperlipidemia   . Hypertension   . IgA nephropathy   . Lower GI bleed    a. Due to colon polyps. Status post resection of 14 polyps  . Non-ST elevation MI (NSTEMI) ([place]) 2016  . PAF (paroxysmal atrial fibrillation) ([place])    a. Dx 05/2019. CHA2DS2VASc  = 3-->Eliquis/Amio.  . Pneumonia 08/2019  . Rheumatoid arthritis ([place])   . Shortness of breath    WITH EXERTION  . Sleep apnea   . Tobacco abuse     Patient Active Problem List   Diagnosis Date Noted  . Gouty tophi of joint 12/06/2019  . Cough with hemoptysis 09/24/2019  . Atrial fibrillation, chronic ([place]) 09/24/2019  . Community acquired pneumonia 09/24/2019  . Hemoptysis 09/24/2019  . Complication of AV dialysis fistula 07/13/2019  . Left lower lobe pneumonia 05/28/2019  . Complication of vascular access for dialysis 11/28/2018  . COPD (chronic obstructive pulmonary disease) ([place]) 12/05/2017  . ESRD on dialysis ([place]) 10/05/2016  . IgA nephropathy 11/05/2014  . S/P CABG x 4 11/29/2013  . Chronic diastolic heart failure ([place]) 11/16/2013  . Coronary artery disease   . Hyperlipidemia   . Hypertensive disorder   . Dyspnea 11/20/2012  . Tobacco abuse     Past Surgical History:  Procedure Laterality Date  . A/V FISTULAGRAM Left 10/28/2016   Procedure: A/V Fistulagram;  Surgeon: [name], MD;  Location: [place] [facility];  Service: Cardiovascular;  Laterality: Left;  . A/V FISTULAGRAM Left 12/07/2018   Procedure: A/V FISTULAGRAM;  Surgeon: [name], MD;  Location: [place] [facility];  Service: Cardiovascular;  Laterality: Left;  . A/V FISTULAGRAM N/A 06/04/2019  Procedure: A/V Fistulagram- LEFT;  Surgeon: [name], MD;  Location: [place] [facility];  Service: Cardiovascular;  Laterality: N/A;  . A/V SHUNT INTERVENTION N/A 10/28/2016   Procedure: A/V Shunt Intervention;  Surgeon: [name], MD;  Location: [place] [facility];  Service: Cardiovascular;  Laterality: N/A;  . A/V SHUNTOGRAM Left 02/01/2019   Procedure: A/V SHUNTOGRAM;  Surgeon: [name], MD;  Location: [place] [facility];  Service: Cardiovascular;  Laterality: Left;  . AV FISTULA PLACEMENT    . AV FISTULA PLACEMENT Left 11/30/2019   Procedure: ARTERIOVENOUS (AV) FISTULA CREATION ( BRACHIAL  CEPHALIC );  Surgeon: Katha Cabal, MD;  Location: [facility];  Service: Vascular;  Laterality: Left;  . CARDIAC CATHETERIZATION     RCA 90% and calcified mid LAD 80% Stenosis  . CORONARY ARTERY BYPASS GRAFT N/A 11/29/2013   Procedure: CORONARY ARTERY BYPASS GRAFTING (CABG) x 4 using endoscopically harvested right saphenous vein and left internal mammary artery;  Surgeon: [name], MD;  Location: [place] [facility];  Service: Open Heart Surgery;  Laterality: N/A;  . dialysis catheterr  2/15  . DIALYSIS/PERMA CATHETER INSERTION N/A 07/16/2019   Procedure: DIALYSIS/PERMA CATHETER INSERTION WITH VAC CHANGE UNDER SEDATION;  Surgeon: [name], MD;  Location: [place] [facility];  Service: Cardiovascular;  Laterality: N/A;  . DIALYSIS/PERMA CATHETER INSERTION N/A 11/08/2019   Procedure: DIALYSIS/PERMA CATHETER INSERTION;  Surgeon: [name], MD;  Location: [place] [facility];  Service: Cardiovascular;  Laterality: N/A;  . DIALYSIS/PERMA CATHETER REMOVAL N/A 02/15/2019   Procedure: DIALYSIS/PERMA CATHETER REMOVAL;  Surgeon: [name], MD;  Location: [place] [facility];  Service: Cardiovascular;  Laterality: N/A;  . INSERTION OF DIALYSIS CATHETER N/A 12/14/2018   Procedure: INSERTION OF DIALYSIS CATHETER ( [place] );  Surgeon: [name], MD;  Location: [facility];  Service: Vascular;  Laterality: N/A;  . INTRAOPERATIVE TRANSESOPHAGEAL ECHOCARDIOGRAM N/A 11/29/2013   Procedure: INTRAOPERATIVE TRANSESOPHAGEAL ECHOCARDIOGRAM;  Surgeon: [name], MD;  Location: [place] [facility];  Service: Open Heart Surgery;  Laterality: N/A;  . LIGATION OF ARTERIOVENOUS  FISTULA Left 07/13/2019   Procedure: LIGATION OF ARTERIOVENOUS  FISTULA;  Surgeon: [name], MD;  Location: [facility];  Service: Vascular;  Laterality: Left;  . PERIPHERAL VASCULAR CATHETERIZATION N/A 06/12/2015   Procedure: A/V Shuntogram/Fistulagram;  Surgeon: [name], MD;  Location: [place] [facility];  Service: Cardiovascular;  Laterality: N/A;    . PERIPHERAL VASCULAR CATHETERIZATION Left 06/12/2015   Procedure: A/V Shunt Intervention;  Surgeon: [name], MD;  Location: [place] [facility];  Service: Cardiovascular;  Laterality: Left;  . RENAL BIOPSY Left 14  . REVISON OF ARTERIOVENOUS FISTULA Left 12/14/2018   Procedure: REVISON OF ARTERIOVENOUS FISTULA;  Surgeon: [name], MD;  Location: [facility];  Service: Vascular;  Laterality: Left;  . UPPER EXTREMITY VENOGRAPHY Bilateral 09/18/2019   Procedure: UPPER EXTREMITY VENOGRAPHY;  Surgeon: Katha Cabal, MD;  Location: [place] [facility];  Service: Cardiovascular;  Laterality: Bilateral;    Prior to Admission medications   Medication Sig Start Date End Date Taking? Authorizing Provider  amiodarone (PACERONE) 200 MG tablet Take 200 mg by mouth every morning.  09/02/19   [provider]  amLODipine (NORVASC) 10 MG tablet Take 10 mg by mouth at bedtime. 11/24/19   [provider]  apixaban (ELIQUIS) 2.5 MG TABS tablet Take 2.5 mg by mouth daily.    [provider]  aspirin EC 81  MG tablet Take 1 tablet (81 mg total) by mouth daily. 11/06/19   Dunn, Areta Haber, PA-C  atorvastatin (LIPITOR) 20 MG tablet take 1 tablet by mouth at bedtime Patient taking differently: Take 20 mg by mouth every morning.  04/25/14   Minna Merritts, MD  AURYXIA 1 GM 210 MG(Fe) tablet Take 420 mg by mouth 3 (three) times daily with meals.     [provider]  calcium carbonate (TUMS - DOSED IN MG ELEMENTAL CALCIUM) 500 MG chewable tablet Chew 1 tablet by mouth daily as needed for indigestion or heartburn.     [provider]  carvedilol (COREG) 12.5 MG tablet Take 1 tablet (12.5 mg total) by mouth 2 (two) times daily with a meal. 11/06/19   Dunn, Areta Haber, PA-C  cinacalcet (SENSIPAR) 30 MG tablet Take 30 mg by mouth daily with supper.     [provider]  cloNIDine (CATAPRES) 0.2 MG tablet Take 1 tablet (0.2 mg total) by mouth 3 (three) times daily. 06/05/19    Nicholes Mango, MD  docusate sodium (COLACE) 100 MG capsule Take 1 capsule (100 mg total) by mouth 2 (two) times daily as needed for mild constipation. 06/05/19   Gouru, Illene Silver, MD  Fluticasone Propionate, Inhal, (FLOVENT DISKUS) 100 MCG/BLIST AEPB Inhale 1 puff into the lungs 2 (two) times daily. Patient taking differently: Inhale 1 puff into the lungs every morning.  10/17/19   Tyler Pita, MD  Fluticasone-Umeclidin-Vilant (TRELEGY ELLIPTA) 100-62.5-25 MCG/INH AEPB Inhale 1 puff into the lungs daily. Patient taking differently: Inhale 1 puff into the lungs every other day.  10/16/19   Tyler Pita, MD  furosemide (LASIX) 80 MG tablet Take 80 mg by mouth 2 (two) times daily.     [provider]  gabapentin (NEURONTIN) 100 MG capsule Take 100 mg by mouth 3 (three) times daily.  01/14/18   [provider]  hydrALAZINE (APRESOLINE) 100 MG tablet Take 50 mg by mouth 3 (three) times daily.     [provider]  HYDROcodone-acetaminophen (NORCO) 5-325 MG tablet Take 1-2 tablets by mouth every 6 (six) hours as needed for moderate pain or severe pain. 11/30/19   Schnier, Dolores Lory, MD  loratadine (CLARITIN) 10 MG tablet Take 1 tablet (10 mg total) by mouth daily. Patient taking differently: Take 10 mg by mouth every morning.  06/06/19   Gouru, Illene Silver, MD  ondansetron (ZOFRAN-ODT) 4 MG disintegrating tablet Take 4 mg by mouth every 8 (eight) hours as needed for nausea.     [provider]  SPIRIVA HANDIHALER 18 MCG inhalation capsule 1 capsule daily. 12/04/19   [provider]  umeclidinium-vilanterol (ANORO ELLIPTA) 62.5-25 MCG/INH AEPB Inhale 1 puff into the lungs daily. 10/17/19   Tyler Pita, MD  zolpidem (AMBIEN) 10 MG tablet Take 10 mg by mouth at bedtime as needed for sleep.    [provider]    Allergies Lisinopril  Family History  Problem Relation Age of Onset  . Heart disease Father   . Heart disease Brother   . Healthy Sister   .  Stroke Neg Hx     Social History Social History   Tobacco Use  . Smoking status: Current Every Day Smoker    Packs/day: 0.50    Years: 32.00    Pack years: 16.00    Types: Cigarettes  . Smokeless tobacco: Never Used  . Tobacco comment: daily  Substance Use Topics  . Alcohol use: Yes    Alcohol/week:  7.0 standard drinks    Types: 7 Cans of beer per week    Comment: 3 BEERS PER WEEK  . Drug use: No    Review of Systems  Constitutional: No fever/chills Eyes: No visual changes. ENT: No sore throat. Cardiovascular: Denies chest pain. Respiratory: Denies shortness of breath. Gastrointestinal: No abdominal pain.  No nausea, no vomiting.  No diarrhea.  No constipation. Genitourinary: Negative for dysuria. Musculoskeletal: Negative for back pain. Skin: Negative for rash. Neurological: Positive for syncope.  Negative for headaches, focal weakness or numbness.   ____________________________________________   PHYSICAL EXAM:  VITAL SIGNS: ED Triage Vitals [12/18/19 2106]  Enc Vitals Group     BP 112/66     Pulse Rate 69     Resp 17     Temp 98.8 F (37.1 C)     Temp Source Oral     SpO2 95 %     Weight 144 lb 10 oz (65.6 kg)     Height      Head Circumference      Peak Flow      Pain Score      Pain Loc      Pain Edu?      Excl. in [place]?     Constitutional: Alert and oriented.  Chronically ill appearing and in no acute distress. Eyes: Conjunctivae are normal. PERRL. EOMI. Head: Atraumatic. Nose: Atraumatic. Mouth/Throat: Mucous membranes are moist.  No dental malocclusion. Neck: No stridor.  No cervical spine tenderness to palpation. Cardiovascular: Normal rate, regular rhythm. Grossly normal heart sounds.  Good peripheral circulation. Respiratory: Normal respiratory effort.  No retractions. Lungs CTAB.  Right-sided dialysis catheter. Gastrointestinal: Soft and nontender to light or deep palpation. No distention. No abdominal bruits. No CVA  tenderness. Musculoskeletal: LUE AV fistula with palpable thrill.  Right olecranon with mobile and painless soft tissue mass without erythema or warmth; likely lipoma.  No lower extremity tenderness nor edema.  No joint effusions. Neurologic: Alert and oriented x3.  CN II-XII grossly intact.  Normal speech and language. No gross focal neurologic deficits are appreciated.  Skin:  Skin is warm, dry and intact. No rash noted. Psychiatric: Mood and affect are normal. Speech and behavior are normal.  ____________________________________________   LABS (all labs ordered are listed, but only abnormal results are displayed)  Labs Reviewed  BASIC METABOLIC PANEL - Abnormal; Notable for the following components:      Result Value   Sodium 134 (*)    Chloride 97 (*)    Glucose, Bld 104 (*)    BUN 27 (*)    Creatinine, Ser 6.31 (*)    GFR calc non Af Amer 9 (*)    GFR calc Af Amer 10 (*)    All other components within normal limits  CBC - Abnormal; Notable for the following components:   RBC 2.56 (*)    Hemoglobin 9.2 (*)    HCT 27.4 (*)    MCV 107.0 (*)    MCH 35.9 (*)    RDW 18.7 (*)    All other components within normal limits  HEPATIC FUNCTION PANEL - Abnormal; Notable for the following components:   Albumin 2.5 (*)    All other components within normal limits  TROPONIN I (HIGH SENSITIVITY) - Abnormal; Notable for the following components:   Troponin I (High Sensitivity) 48 (*)    All other components within normal limits  TROPONIN I (HIGH SENSITIVITY) - Abnormal; Notable for the following components:   Troponin  I (High Sensitivity) 52 (*)    All other components within normal limits  SARS CORONAVIRUS 2 (TAT 6-24 HRS)  LIPASE, BLOOD  ETHANOL  CK  URINALYSIS, COMPLETE (UACMP) WITH MICROSCOPIC  CBG MONITORING, ED   ____________________________________________  EKG  ED ECG REPORT I, Kaydra Borgen J, the attending physician, personally viewed and interpreted this ECG.   Date:  12/18/2019  EKG Time: 2104  Rate: 70  Rhythm: normal EKG, normal sinus rhythm  Axis: Normal  Intervals:none  ST&T Change: Nonspecific  ____________________________________________  RADIOLOGY  ED MD interpretation: No ICH; no acute cardiopulmonary process  Official radiology report(s): CT Head Wo Contrast  Result Date: 12/18/2019 CLINICAL DATA:  Head trauma. EXAM: CT HEAD WITHOUT CONTRAST TECHNIQUE: Contiguous axial images were obtained from the base of the skull through the vertex without intravenous contrast. COMPARISON:  CT head dated 05/28/2019. FINDINGS: Brain: No evidence of acute infarction, hemorrhage, hydrocephalus, extra-axial collection or mass lesion/mass effect. Atrophy and chronic microvascular ischemic changes. There is an old lacunar infarct involving the left thalamus. Vascular: No hyperdense vessel or unexpected calcification. Skull: Normal. Negative for fracture or focal lesion. Sinuses/Orbits: No acute finding. Other: None. IMPRESSION: No acute intracranial pathology. Electronically Signed   By: Constance Holster M.D.   On: 12/18/2019 21:57   DG Chest Port 1 View  Result Date: 12/18/2019 CLINICAL DATA:  Syncope EXAM: PORTABLE CHEST 1 VIEW COMPARISON:  09/24/2019 FINDINGS: Right dialysis catheter is unchanged. Prior CABG. Chronic changes in the lungs. Scarring in the lung bases. Heart is borderline in size. No acute confluent opacities or effusions. No acute bony abnormality. IMPRESSION: Chronic changes in the lungs. Bibasilar scarring. No active disease. Electronically Signed   By: Rolm Baptise M.D.   On: 12/18/2019 23:24    ____________________________________________   PROCEDURES  Procedure(s) performed (including Critical Care):  .1-3 Lead EKG Interpretation Performed by: Paulette Blanch, MD Authorized by: Paulette Blanch, MD     Interpretation: normal     ECG rate:  74   ECG rate assessment: normal     Rhythm: sinus rhythm     Ectopy: none     Conduction:  normal       ____________________________________________   INITIAL IMPRESSION / ASSESSMENT AND PLAN / ED COURSE  As part of my medical decision making, I reviewed the following data within the [facility] notes reviewed and incorporated, Labs reviewed, EKG interpreted, Old chart reviewed, Radiograph reviewed and Notes from prior ED visits     OLAWALE MARNEY was evaluated in Emergency Department on 12/19/2019 for the symptoms described in the history of present illness. He was evaluated in the context of the global COVID-19 pandemic, which necessitated consideration that the patient might be at risk for infection with the SARS-CoV-2 virus that causes COVID-19. Institutional protocols and algorithms that pertain to the evaluation of patients at risk for COVID-19 are in a state of rapid change based on information released by regulatory bodies including the CDC and federal and state organizations. These policies and algorithms were followed during the patient's care in the ED.    59 year old male presenting with syncopal episode.  Differential diagnosis includes but is not limited to cardiac, neurologic, infectious, metabolic etiologies, etc.  Unremarkable work-up thus far; will check repeat troponin, LFTs, EtOH, CK, chest x-ray, orthostatics.  Patient currently resting in no acute distress.  Clinical Course as of Dec 19 19  Wed Dec 19, 2019  0020 + Orthostatics.  Patient is high risk per  SF syncope rule.  Will discuss with hospitalist services for admission.   [JS]    Clinical Course User Index [JS] Paulette Blanch, MD     ____________________________________________   FINAL CLINICAL IMPRESSION(S) / ED DIAGNOSES  Final diagnoses:  Syncope, unspecified syncope type  Orthostasis  ESRD (end stage renal disease) on dialysis [facility])     ED Discharge Orders    None       Note:  This document was prepared using Dragon voice recognition software and may  include unintentional dictation errors.   Paulette Blanch, MD 12/19/19 [phone]

## 2019-12-19 ENCOUNTER — Other Ambulatory Visit: Payer: Self-pay

## 2019-12-19 DIAGNOSIS — E785 Hyperlipidemia, unspecified: Secondary | ICD-10-CM

## 2019-12-19 DIAGNOSIS — I480 Paroxysmal atrial fibrillation: Secondary | ICD-10-CM | POA: Diagnosis present

## 2019-12-19 DIAGNOSIS — I951 Orthostatic hypotension: Principal | ICD-10-CM

## 2019-12-19 DIAGNOSIS — G4730 Sleep apnea, unspecified: Secondary | ICD-10-CM

## 2019-12-19 DIAGNOSIS — I959 Hypotension, unspecified: Secondary | ICD-10-CM | POA: Diagnosis present

## 2019-12-19 DIAGNOSIS — Z86010 Personal history of colonic polyps: Secondary | ICD-10-CM | POA: Diagnosis not present

## 2019-12-19 DIAGNOSIS — M069 Rheumatoid arthritis, unspecified: Secondary | ICD-10-CM | POA: Diagnosis present

## 2019-12-19 DIAGNOSIS — F17210 Nicotine dependence, cigarettes, uncomplicated: Secondary | ICD-10-CM | POA: Diagnosis present

## 2019-12-19 DIAGNOSIS — I5032 Chronic diastolic (congestive) heart failure: Secondary | ICD-10-CM | POA: Diagnosis not present

## 2019-12-19 DIAGNOSIS — J449 Chronic obstructive pulmonary disease, unspecified: Secondary | ICD-10-CM | POA: Diagnosis present

## 2019-12-19 DIAGNOSIS — N186 End stage renal disease: Secondary | ICD-10-CM

## 2019-12-19 DIAGNOSIS — I120 Hypertensive chronic kidney disease with stage 5 chronic kidney disease or end stage renal disease: Secondary | ICD-10-CM | POA: Diagnosis not present

## 2019-12-19 DIAGNOSIS — Z951 Presence of aortocoronary bypass graft: Secondary | ICD-10-CM | POA: Diagnosis not present

## 2019-12-19 DIAGNOSIS — I132 Hypertensive heart and chronic kidney disease with heart failure and with stage 5 chronic kidney disease, or end stage renal disease: Secondary | ICD-10-CM | POA: Diagnosis present

## 2019-12-19 DIAGNOSIS — Y92007 Garden or yard of unspecified non-institutional (private) residence as the place of occurrence of the external cause: Secondary | ICD-10-CM | POA: Diagnosis not present

## 2019-12-19 DIAGNOSIS — K219 Gastro-esophageal reflux disease without esophagitis: Secondary | ICD-10-CM | POA: Diagnosis present

## 2019-12-19 DIAGNOSIS — F1010 Alcohol abuse, uncomplicated: Secondary | ICD-10-CM | POA: Diagnosis present

## 2019-12-19 DIAGNOSIS — M1990 Unspecified osteoarthritis, unspecified site: Secondary | ICD-10-CM | POA: Diagnosis present

## 2019-12-19 DIAGNOSIS — Z20822 Contact with and (suspected) exposure to covid-19: Secondary | ICD-10-CM | POA: Diagnosis present

## 2019-12-19 DIAGNOSIS — N028 Recurrent and persistent hematuria with other morphologic changes: Secondary | ICD-10-CM | POA: Diagnosis present

## 2019-12-19 DIAGNOSIS — N2581 Secondary hyperparathyroidism of renal origin: Secondary | ICD-10-CM | POA: Diagnosis not present

## 2019-12-19 DIAGNOSIS — Z992 Dependence on renal dialysis: Secondary | ICD-10-CM

## 2019-12-19 DIAGNOSIS — W1839XA Other fall on same level, initial encounter: Secondary | ICD-10-CM | POA: Diagnosis present

## 2019-12-19 DIAGNOSIS — I10 Essential (primary) hypertension: Secondary | ICD-10-CM

## 2019-12-19 DIAGNOSIS — F419 Anxiety disorder, unspecified: Secondary | ICD-10-CM | POA: Diagnosis present

## 2019-12-19 DIAGNOSIS — D631 Anemia in chronic kidney disease: Secondary | ICD-10-CM | POA: Diagnosis not present

## 2019-12-19 DIAGNOSIS — J431 Panlobular emphysema: Secondary | ICD-10-CM | POA: Diagnosis not present

## 2019-12-19 DIAGNOSIS — E43 Unspecified severe protein-calorie malnutrition: Secondary | ICD-10-CM

## 2019-12-19 DIAGNOSIS — I2510 Atherosclerotic heart disease of native coronary artery without angina pectoris: Secondary | ICD-10-CM | POA: Diagnosis present

## 2019-12-19 DIAGNOSIS — G4733 Obstructive sleep apnea (adult) (pediatric): Secondary | ICD-10-CM | POA: Diagnosis present

## 2019-12-19 LAB — CBC
HCT: 26 % — ABNORMAL LOW (ref 39.0–52.0)
Hemoglobin: 8.8 g/dL — ABNORMAL LOW (ref 13.0–17.0)
MCH: 35.6 pg — ABNORMAL HIGH (ref 26.0–34.0)
MCHC: 33.8 g/dL (ref 30.0–36.0)
MCV: 105.3 fL — ABNORMAL HIGH (ref 80.0–100.0)
Platelets: 270 10*3/uL (ref 150–400)
RBC: 2.47 MIL/uL — ABNORMAL LOW (ref 4.22–5.81)
RDW: 18.5 % — ABNORMAL HIGH (ref 11.5–15.5)
WBC: 9.5 10*3/uL (ref 4.0–10.5)
nRBC: 0 % (ref 0.0–0.2)

## 2019-12-19 LAB — BASIC METABOLIC PANEL
Anion gap: 14 (ref 5–15)
BUN: 28 mg/dL — ABNORMAL HIGH (ref 6–20)
CO2: 23 mmol/L (ref 22–32)
Calcium: 8.9 mg/dL (ref 8.9–10.3)
Chloride: 98 mmol/L (ref 98–111)
Creatinine, Ser: 6.88 mg/dL — ABNORMAL HIGH (ref 0.61–1.24)
GFR calc Af Amer: 9 mL/min — ABNORMAL LOW (ref >60)
GFR calc non Af Amer: 8 mL/min — ABNORMAL LOW (ref >60)
Glucose, Bld: 82 mg/dL (ref 70–99)
Potassium: 4.1 mmol/L (ref 3.5–5.1)
Sodium: 135 mmol/L (ref 135–145)

## 2019-12-19 LAB — HEPATIC FUNCTION PANEL
ALT: 9 U/L (ref 0–44)
AST: 17 U/L (ref 15–41)
Albumin: 2.5 g/dL — ABNORMAL LOW (ref 3.5–5.0)
Alkaline Phosphatase: 79 U/L (ref 38–126)
Bilirubin, Direct: <0.1 mg/dL (ref 0.0–0.2)
Total Bilirubin: 0.5 mg/dL (ref 0.3–1.2)
Total Protein: 6.5 g/dL (ref 6.5–8.1)

## 2019-12-19 LAB — SARS CORONAVIRUS 2 (TAT 6-24 HRS): SARS Coronavirus 2: NEGATIVE

## 2019-12-19 LAB — ETHANOL: Alcohol, Ethyl (B): <10 mg/dL (ref <10)

## 2019-12-19 LAB — CK: Total CK: 66 U/L (ref 49–397)

## 2019-12-19 LAB — LIPASE, BLOOD: Lipase: 45 U/L (ref 11–51)

## 2019-12-19 MED ORDER — ASPIRIN EC 81 MG PO TBEC
81.0000 mg | DELAYED_RELEASE_TABLET | Freq: Every day | ORAL | Status: DC
  Administered 2019-12-19 – 2019-12-20 (×2): 81 mg via ORAL
  Filled 2019-12-19 (×2): qty 1

## 2019-12-19 MED ORDER — TIOTROPIUM BROMIDE MONOHYDRATE 18 MCG IN CAPS: 18.0000 ug | ORAL_CAPSULE | Freq: Every day | RESPIRATORY_TRACT | Status: DC

## 2019-12-19 MED ORDER — ADULT MULTIVITAMIN W/MINERALS CH
1.0000 | ORAL_TABLET | Freq: Every day | ORAL | Status: DC
  Administered 2019-12-20: 1 via ORAL
  Filled 2019-12-19: qty 1

## 2019-12-19 MED ORDER — RENA-VITE PO TABS
1.0000 | ORAL_TABLET | Freq: Every day | ORAL | Status: DC
  Administered 2019-12-19: 1 via ORAL
  Filled 2019-12-19 (×2): qty 1

## 2019-12-19 MED ORDER — CHLORHEXIDINE GLUCONATE CLOTH 2 % EX PADS
6.0000 | MEDICATED_PAD | Freq: Every day | CUTANEOUS | Status: DC
  Administered 2019-12-19: 6 via TOPICAL

## 2019-12-19 MED ORDER — APIXABAN 2.5 MG PO TABS: 2.5000 mg | ORAL_TABLET | Freq: Every day | ORAL | Status: DC

## 2019-12-19 MED ORDER — ACETAMINOPHEN 650 MG RE SUPP: 650.0000 mg | Freq: Four times a day (QID) | RECTAL | Status: DC | PRN

## 2019-12-19 MED ORDER — FERRIC CITRATE 1 GM 210 MG(FE) PO TABS
420.0000 mg | ORAL_TABLET | Freq: Three times a day (TID) | ORAL | Status: DC
  Administered 2019-12-19 – 2019-12-20 (×3): 420 mg via ORAL
  Filled 2019-12-19 (×4): qty 2

## 2019-12-19 MED ORDER — SEVELAMER CARBONATE 800 MG PO TABS
800.0000 mg | ORAL_TABLET | Freq: Three times a day (TID) | ORAL | Status: DC
  Administered 2019-12-19 – 2019-12-20 (×3): 800 mg via ORAL
  Filled 2019-12-19 (×4): qty 1

## 2019-12-19 MED ORDER — NEPRO/CARBSTEADY PO LIQD: 237.0000 mL | Freq: Two times a day (BID) | ORAL | Status: DC

## 2019-12-19 MED ORDER — UMECLIDINIUM BROMIDE 62.5 MCG/INH IN AEPB
1.0000 | INHALATION_SPRAY | Freq: Every day | RESPIRATORY_TRACT | Status: DC
  Administered 2019-12-19 – 2019-12-20 (×2): 1 via RESPIRATORY_TRACT
  Filled 2019-12-19: qty 7

## 2019-12-19 MED ORDER — ZOLPIDEM TARTRATE 5 MG PO TABS
10.0000 mg | ORAL_TABLET | Freq: Every evening | ORAL | Status: DC | PRN
  Administered 2019-12-19: 10 mg via ORAL
  Filled 2019-12-19: qty 2

## 2019-12-19 MED ORDER — ONDANSETRON HCL 4 MG/2ML IJ SOLN: 4.0000 mg | Freq: Four times a day (QID) | INTRAMUSCULAR | Status: DC | PRN

## 2019-12-19 MED ORDER — AMIODARONE HCL 200 MG PO TABS: 200.0000 mg | ORAL_TABLET | ORAL | Status: DC

## 2019-12-19 MED ORDER — SODIUM CHLORIDE 0.9% FLUSH
3.0000 mL | Freq: Two times a day (BID) | INTRAVENOUS | Status: DC
  Administered 2019-12-19 – 2019-12-20 (×4): 3 mL via INTRAVENOUS

## 2019-12-19 MED ORDER — ACETAMINOPHEN 325 MG PO TABS: 650.0000 mg | ORAL_TABLET | Freq: Four times a day (QID) | ORAL | Status: DC | PRN

## 2019-12-19 MED ORDER — ONDANSETRON HCL 4 MG PO TABS: 4.0000 mg | ORAL_TABLET | Freq: Four times a day (QID) | ORAL | Status: DC | PRN

## 2019-12-19 MED ORDER — AMLODIPINE BESYLATE 10 MG PO TABS
10.0000 mg | ORAL_TABLET | Freq: Every day | ORAL | Status: DC
  Administered 2019-12-19: 10 mg via ORAL
  Filled 2019-12-19: qty 1

## 2019-12-19 MED ORDER — HEPARIN SODIUM (PORCINE) 5000 UNIT/ML IJ SOLN: 5000.0000 [IU] | Freq: Three times a day (TID) | INTRAMUSCULAR | Status: DC

## 2019-12-19 MED ORDER — SODIUM CHLORIDE 0.9 % IV BOLUS
500.0000 mL | Freq: Once | INTRAVENOUS | Status: AC
  Administered 2019-12-19: 500 mL via INTRAVENOUS

## 2019-12-19 MED ORDER — ATORVASTATIN CALCIUM 20 MG PO TABS
20.0000 mg | ORAL_TABLET | ORAL | Status: DC
  Administered 2019-12-19: 20 mg via ORAL
  Filled 2019-12-19: qty 1

## 2019-12-19 MED ORDER — HEPARIN SODIUM (PORCINE) 5000 UNIT/ML IJ SOLN
5000.0000 [IU] | Freq: Three times a day (TID) | INTRAMUSCULAR | Status: DC
  Administered 2019-12-19 – 2019-12-20 (×3): 5000 [IU] via SUBCUTANEOUS
  Filled 2019-12-19 (×3): qty 1

## 2019-12-19 MED ORDER — FLUTICASONE-UMECLIDIN-VILANT 100-62.5-25 MCG/INH IN AEPB: 1.0000 | INHALATION_SPRAY | Freq: Every day | RESPIRATORY_TRACT | Status: DC

## 2019-12-19 MED ORDER — EPOETIN ALFA 10000 UNIT/ML IJ SOLN
10000.0000 [IU] | INTRAMUSCULAR | Status: DC
  Administered 2019-12-19: 10000 [IU] via INTRAVENOUS
  Filled 2019-12-19: qty 1

## 2019-12-19 MED ORDER — CARVEDILOL 12.5 MG PO TABS
12.5000 mg | ORAL_TABLET | Freq: Two times a day (BID) | ORAL | Status: DC
  Administered 2019-12-19: 12.5 mg via ORAL
  Filled 2019-12-19 (×2): qty 1

## 2019-12-19 MED ORDER — FLUTICASONE FUROATE-VILANTEROL 100-25 MCG/INH IN AEPB
1.0000 | INHALATION_SPRAY | Freq: Every day | RESPIRATORY_TRACT | Status: DC
  Administered 2019-12-19 – 2019-12-20 (×2): 1 via RESPIRATORY_TRACT
  Filled 2019-12-19: qty 28

## 2019-12-19 MED ORDER — GABAPENTIN 100 MG PO CAPS
100.0000 mg | ORAL_CAPSULE | Freq: Three times a day (TID) | ORAL | Status: DC
  Administered 2019-12-19 – 2019-12-20 (×4): 100 mg via ORAL
  Filled 2019-12-19 (×5): qty 1

## 2019-12-19 NOTE — Evaluation (Signed)
Occupational Therapy Evaluation Patient Details Name: Daryl Weeks MRN: [id number] DOB: May 08, 1961 Today's Date: 12/19/2019    History of Present Illness 59 y.o. male with PMHx of ESRD on HD MWF and sometimes Saturday, CAD s/p CABG, paroxysmal A-fib on Eliquis, COPD, HFpEF, HTN, HLD, hx CVA, anemia of chronic disease, gout, and OSA who presents to the ED for evaluation after a fall at home.  Patient has somewhat dysarthric speech chronically from prior CVA otherwise is providing full history. Patient states that he was getting other car to return to go back into his home after going to the pharmacy when he became very lightheaded and fell down to the ground.  He denies loss of consciousness or injury to his head.  He thinks he might have landed on his right side.  He crawled into his house and onto the couch.  His son called EMS for him.  He denies any associated chest pain, palpitations, dyspnea, cough, nausea, vomiting, diarrhea, abdominal pain. He says he did take his morning blood pressure medications prior to the event.  He reports being followed by speech therapy for his dysarthria as well as symptoms of feeling as food sometimes getting stuck at the level of his chest when he swallows.   Clinical Impression   Pt was seen for OT evaluation this date. Prior to hospital admission, pt was generally independent, taking sponge baths 2/2 his HD catheter, and driving. Pt endorses using garden hoe for outside mobility to/from car and in yard ("feel more steady with it than my cane"), using no AD in community, and "furniture walking" inside the home. Pt endorses 5 falls in past 12 months (combination of lightheaded/passing out/tripping). Pt lives by himself in a mobile home with 5 STE with R/L rails. Currently pt demonstrates impairments as described below (See OT problem list) which functionally limit his ability to perform ADL/self-care tasks. + Orthostatic hypotension and symptomatic with functional  mobility attempts during session. CGA for STS ADL performance. High risk of falling.  Pt would benefit from skilled OT to address noted impairments and functional limitations (see below for any additional details) in order to maximize safety and independence while minimizing falls risk and caregiver burden. Upon hospital discharge, recommend [facility] [place] and initial 24/7 supervision/assist, particularly for OOB/functional mobility to maximize pt safety and return to functional independence during meaningful occupations of daily life.     Follow Up Recommendations  Home health OT;Supervision/Assistance - 24 hour(initial supervision for ADL mobility)    Equipment Recommendations  3 in 1 bedside commode    Recommendations for Other Services       Precautions / Restrictions Precautions Precautions: Fall;Other (comment) Precaution Comments: monitor vitals for orthostatic hypotension (+ during OT eval), LUE fistula in progress, scabbed, R HD cath Restrictions Weight Bearing Restrictions: No      Mobility Bed Mobility Overal bed mobility: Needs Assistance Bed Mobility: Supine to Sit;Sit to Supine     Supine to sit: Supervision Sit to supine: Supervision   General bed mobility comments: increased effort, heavy use of BUE and bed rail  Transfers Overall transfer level: Needs assistance Equipment used: Rolling walker (2 wheeled) Transfers: Sit to/from Stand Sit to Stand: Min assist         General transfer comment: heavy reliance on RW initially    Balance Overall balance assessment: Needs assistance Sitting-balance support: Single extremity supported;Feet supported Sitting balance-Leahy Scale: Fair Sitting balance - Comments: fatigues quickly, props himself up on one arm   Standing balance support:  Bilateral upper extremity supported Standing balance-Leahy Scale: Fair Standing balance comment: moderate cues while standing to improve posture and push through RW for improved stability;  becomes symptomatic in standing for OH                           ADL either performed or assessed with clinical judgement   ADL Overall ADL's : Needs assistance/impaired Eating/Feeding: Independent   Grooming: Independent   Upper Body Bathing: Sitting;Set up;Supervision/ safety   Lower Body Bathing: Sitting/lateral leans;Min guard   Upper Body Dressing : Sitting;Set up;Supervision/safety   Lower Body Dressing: Sit to/from stand;Sitting/lateral leans;Min guard;Supervision/safety;Set up Lower Body Dressing Details (indicate cue type and reason): Pt able to don socks seated EOB without assist required; CGA for standing to complete donning of LB dressing over hips Toilet Transfer: Min guard;Stand-pivot;BSC;RW                   Vision Baseline Vision/History: Wears glasses Wears Glasses: At all times(pt reports losing them) Patient Visual Report: No change from baseline       Perception     Praxis      Pertinent Vitals/Pain Pain Assessment: No/denies pain(endorses being "sore" on R side from recent fall but denies pain)     Hand Dominance Right   Extremity/Trunk Assessment Upper Extremity Assessment Upper Extremity Assessment: Generalized weakness(LUE Fistula, R HD cath)   Lower Extremity Assessment Lower Extremity Assessment: Generalized weakness       Communication Communication Communication: Other (comment)(dysarthria from previous CVA)   Cognition Arousal/Alertness: Awake/alert Behavior During Therapy: Flat affect Overall Cognitive Status: Within Functional Limits for tasks assessed                                 General Comments: alert and oriented, follows all commands   General Comments  Supine BP 131/69, PR 73, 100% O2 on RA; sitting initial 112/65, PR 76, 100% O2 - denies symptoms; sitting 3 min after donning socks EOB BP 114/67, PR 77, 97% O2 - denies symptoms; standing initial BP 102/65, PR 79, 97% O2 - endorses  increasing weakness. Weakness improved slightly once returned to supine. Supine BP 143/76.    Exercises Other Exercises Other Exercises: Pt instructed in falls prevention strategies and s/s orthostatic hypotension and strategies to minimize OH risk   Shoulder Instructions      Home Living Family/patient expects to be discharged to:: Private residence Living Arrangements: Alone   Type of Home: Mobile home Home Access: Stairs to enter Entrance Stairs-Number of Steps: 5 Entrance Stairs-Rails: Right;Left Home Layout: One level     Bathroom Shower/Tub: Teacher, early years/pre: Standard     Home Equipment: Cane - single point;Other (comment);Walker - 4 wheels   Additional Comments: Pt reports feeling more stable ambulating with his garden hoe versus SPC, also has rollator but the breaks are broken per pt      Prior Functioning/Environment Level of Independence: Independent with assistive device(s)        Comments: Pt reports that he still drives, mows his lawn, overall able to do all he needs, endorses approx 5 falls in past 12 months (lightheaded/passed out/tripped); takes bird baths (can't get HD catheter wet)        OT Problem List: Decreased strength;Cardiopulmonary status limiting activity;Decreased activity tolerance;Impaired balance (sitting and/or standing);Decreased knowledge of use of DME or AE  OT Treatment/Interventions: Self-care/ADL training;Therapeutic exercise;Therapeutic activities;DME and/or AE instruction;Patient/family education;Balance training;Energy conservation    OT Goals(Current goals can be found in the care plan section) Acute Rehab OT Goals Patient Stated Goal: to get better and go home OT Goal Formulation: With patient Time For Goal Achievement: 01/02/20 Potential to Achieve Goals: Good ADL Goals Pt Will Perform Lower Body Dressing: with supervision;sit to/from stand(utilizing learned falls prevention/OH strategies) Pt Will  Transfer to Toilet: with supervision;ambulating;regular height toilet(LRAD for amb, utilizing learned falls prevention/OH strategies) Additional ADL Goal #1: Pt will verbalize plan to implement at least 2 learned falls prevention/OH prevention strategies to maximize safety and indep with ADL and ADL mobility.  OT Frequency: Min 1X/week   Barriers to D/C: Decreased caregiver support          Co-evaluation              AM-PAC OT "6 Clicks" Daily Activity     Outcome Measure Help from another person eating meals?: None Help from another person taking care of personal grooming?: None Help from another person toileting, which includes using toliet, bedpan, or urinal?: A Little Help from another person bathing (including washing, rinsing, drying)?: A Little Help from another person to put on and taking off regular upper body clothing?: None Help from another person to put on and taking off regular lower body clothing?: A Little 6 Click Score: 21   End of Session Equipment Utilized During Treatment: Gait belt;Rolling walker Nurse Communication: Mobility status;Other (comment)(+OH)  Activity Tolerance: Patient tolerated treatment well Patient left: in bed;with call bell/phone within reach;with bed alarm set  OT Visit Diagnosis: Other abnormalities of gait and mobility (R26.89);Repeated falls (R29.6);Muscle weakness (generalized) (M62.81)                Time: 7121-9758 OT Time Calculation (min): 36 min Charges:  OT General Charges $OT Visit: 1 Visit OT Evaluation $OT Eval Moderate Complexity: 1 Mod OT Treatments $Therapeutic Activity: 8-22 mins  Jeni Salles, MPH, MS, OTR/L ascom [phone] 12/19/19, 11:54 AM

## 2019-12-19 NOTE — Progress Notes (Signed)
PT Cancellation Note  Patient Details Name: Daryl Weeks MRN: [id number] DOB: 1961-06-02   Cancelled Treatment:    Reason Eval/Treat Not Completed: Other (comment). Pt currently out of room for HD this date. Will re-attempt next available date.   Trasean Delima 12/19/2019, 1:43 PM  Greggory Stallion, PT, DPT [phone]

## 2019-12-19 NOTE — Progress Notes (Addendum)
Initial Nutrition Assessment  DOCUMENTATION CODES:   Severe malnutrition in context of chronic illness  INTERVENTION:   Nepro Shake po BID, each supplement provides 425 kcal and 19 grams protein  Rena-vite daily   Recommend folic acid, thiamine and MVI daily in setting of etoh abuse  Magic cup TID with meals, each supplement provides 290 kcal and 9 grams of protein  Liberalize diet   Pt likely at high refeed risk; recommend monitor K, Mg and P labs daily until stable  NUTRITION DIAGNOSIS:   Severe Malnutrition related to chronic illness(COPD, ESRD on HD, etoh abuse) as evidenced by severe fat depletion, severe muscle depletion.  GOAL:   Patient will meet greater than or equal to 90% of their needs  MONITOR:   PO intake, Supplement acceptance, Labs, Weight trends, Skin, I & O's  REASON FOR ASSESSMENT:   Malnutrition Screening Tool    ASSESSMENT:   59 y.o. male with medical history significant for ESRD on HD MWF and sometimes Saturday, CAD s/p CABG, paroxysmal atrial fibrillation on Eliquis, COPD, HFpEF, hypertension, hyperlipidemia, history of CVA, anemia of chronic disease, gout, and OSA who presents to the ED for evaluation after a fall at home.  Met with pt in room today. Pt is a poor historian and unable to provide much nutrition related history. Pt reports that his appetite is poor. Pt is wanting to know when he is going to dialysis. Pt reports that he does not drink any supplements or take any vitamins at home but he is willing to drink vanilla Nepro while in hospital. Per chart, pt appears fairly weight stable pta. RD will add supplements and vitamins to help pt meet his estimated needs and replace losses from HD. RD will also liberalize pt's diet to encourage increased oral intake.   Medications reviewed and include: aspirin, epogen, heparin  Labs reviewed: BUN 28(H), creat 6.88(H) Hgb 8.8(L), Hct 26.0(L), MCV 105.3(H), MCH 35.6(H)  NUTRITION - FOCUSED PHYSICAL  EXAM:    Most Recent Value  Orbital Region  Mild depletion  Upper Arm Region  Severe depletion  Thoracic and Lumbar Region  Severe depletion  Buccal Region  Moderate depletion  Temple Region  Moderate depletion  Clavicle Bone Region  Severe depletion  Clavicle and Acromion Bone Region  Severe depletion  Scapular Bone Region  Severe depletion  Dorsal Hand  Moderate depletion  Patellar Region  Severe depletion  Anterior Thigh Region  Moderate depletion  Posterior Calf Region  Severe depletion  Edema (RD Assessment)  None  Hair  Reviewed  Eyes  Reviewed  Mouth  Reviewed  Skin  Reviewed  Nails  Reviewed     Diet Order:   Diet Order            Diet regular Room service appropriate? Yes; Fluid consistency: Thin  Diet effective now             EDUCATION NEEDS:   Education needs have been addressed  Skin:  Skin Assessment: Reviewed RN Assessment(closed incision L arm)  Last BM:  4/5- constipation  Height:   Ht Readings from Last 1 Encounters:  12/06/19 '5\' 7"'$  (1.702 m)    Weight:   Wt Readings from Last 1 Encounters:  12/19/19 69.3 kg    Ideal Body Weight:  67 kg  BMI:  Body mass index is 23.93 kg/m.  Estimated Nutritional Needs:   Kcal:  1900-2200kcal/day  Protein:  95-110g/day  Fluid:  UOP +1L  Koleen Distance MS, RD, LDN Please refer  to [facility] for RD and/or RD on-call/weekend/after hours pager

## 2019-12-19 NOTE — Progress Notes (Signed)
[place] [facility] [place] NAME: Daryl Weeks    MR#:  [id number]  DATE OF BIRTH:  May 27, 1961  SUBJECTIVE:  patient seen at dialysis. Seems to be a poor historian tells me he felt weak and legs gave out while he was walking to his car from the drugstore yesterday. Denies losing consciousness.  Found to have blood pressure 102/66 when he came to the ER. Blood pressure since then has been stable. Reports taking his meds at home REVIEW OF SYSTEMS:   Review of Systems  Constitutional: Negative for chills, fever and weight loss.  HENT: Negative for ear discharge, ear pain and nosebleeds.   Eyes: Negative for blurred vision, pain and discharge.  Respiratory: Negative for sputum production, shortness of breath, wheezing and stridor.   Cardiovascular: Negative for chest pain, palpitations, orthopnea and PND.  Gastrointestinal: Negative for abdominal pain, diarrhea, nausea and vomiting.  Genitourinary: Negative for frequency and urgency.  Musculoskeletal: Negative for back pain and joint pain.  Neurological: Positive for weakness. Negative for sensory change, speech change and focal weakness.  Psychiatric/Behavioral: Negative for depression and hallucinations. The patient is not nervous/anxious.    Tolerating Diet:yes Tolerating PT: pending  DRUG ALLERGIES:   Allergies  Allergen Reactions  . Lisinopril Hives    VITALS:  Blood pressure 138/76, pulse 77, temperature (!) 97.5 F (36.4 C), temperature source Oral, resp. rate 13, weight 69.3 kg, SpO2 100 %.  PHYSICAL EXAMINATION:   Physical Exam  GENERAL:  59 y.o.-year-old patient lying in the bed with no acute distress.  Appears chronically ill EYES: Pupils equal, round, reactive to light and accommodation. No scleral icterus.   HEENT: Head atraumatic, normocephalic. Oropharynx and nasopharynx clear.  NECK:  Supple, no jugular venous distention. No thyroid enlargement, no tenderness.  LUNGS:  Normal breath sounds bilaterally, no wheezing, rales, rhonchi. No use of accessory muscles of respiration.  CARDIOVASCULAR: S1, S2 normal. No murmurs, rubs, or gallops.  ABDOMEN: Soft, nontender, nondistended. Bowel sounds present. No organomegaly or mass.  EXTREMITIES: No cyanosis, clubbing or edema b/l.   HD access+  NEUROLOGIC: Cranial nerves II through XII are intact. No focal Motor or sensory deficits b/l.   PSYCHIATRIC:  patient is alert and oriented x 3.  SKIN: No obvious rash, lesion, or ulcer.   LABORATORY PANEL:  CBC Recent Labs  Lab 12/19/19 0521  WBC 9.5  HGB 8.8*  HCT 26.0*  PLT 270    Chemistries  Recent Labs  Lab 12/18/19 2103 12/18/19 2103 12/19/19 0521  NA 134*   < > 135  K 3.8   < > 4.1  CL 97*   < > 98  CO2 25   < > 23  GLUCOSE 104*   < > 82  BUN 27*   < > 28*  CREATININE 6.31*   < > 6.88*  CALCIUM 9.0   < > 8.9  AST 17  --   --   ALT 9  --   --   ALKPHOS 79  --   --   BILITOT 0.5  --   --    < > = values in this interval not displayed.   Cardiac Enzymes No results for input(s): TROPONINI in the last 168 hours. RADIOLOGY:  CT Head Wo Contrast  Result Date: 12/18/2019 CLINICAL DATA:  Head trauma. EXAM: CT HEAD WITHOUT CONTRAST TECHNIQUE: Contiguous axial images were obtained from the base of the skull through the vertex without intravenous contrast. COMPARISON:  CT head dated 05/28/2019. FINDINGS: Brain: No evidence of acute infarction, hemorrhage, hydrocephalus, extra-axial collection or mass lesion/mass effect. Atrophy and chronic microvascular ischemic changes. There is an old lacunar infarct involving the left thalamus. Vascular: No hyperdense vessel or unexpected calcification. Skull: Normal. Negative for fracture or focal lesion. Sinuses/Orbits: No acute finding. Other: None. IMPRESSION: No acute intracranial pathology. Electronically Signed   By: Constance Holster M.D.   On: 12/18/2019 21:57   DG Chest Port 1 View  Result Date:  12/18/2019 CLINICAL DATA:  Syncope EXAM: PORTABLE CHEST 1 VIEW COMPARISON:  09/24/2019 FINDINGS: Right dialysis catheter is unchanged. Prior CABG. Chronic changes in the lungs. Scarring in the lung bases. Heart is borderline in size. No acute confluent opacities or effusions. No acute bony abnormality. IMPRESSION: Chronic changes in the lungs. Bibasilar scarring. No active disease. Electronically Signed   By: Rolm Baptise M.D.   On: 12/18/2019 23:24   ASSESSMENT AND PLAN:   Daryl Weeks is a 59 y.o. male with medical history significant for ESRD on HD MWF and sometimes Saturday, CAD s/p CABG, paroxysmal atrial fibrillation on Eliquis, COPD, HFpEF, hypertension, hyperlipidemia, history of CVA, anemia of chronic disease, gout, and OSA who presents to the ED for evaluation after a fall at home.  Patient has somewhat dysarthric speech chronically from prior CVA.  Fall due to orthostatic hypotension: -Without significant injury.  Likely secondary to antihypertensive and decreased oral intake from dysphagia type symptoms.  He was given 500 cc normal saline in the ED. -Hold further fluids for now -Hold home amlodipine, Coreg, clonidine, Lasix, hydralazine--Dr Kollur to resume and adjust dosing. Per him pt's BP is labile -PT/OT eval pending -No arrhythmia reported on telemetry  ESRD on HD MWF and sometimes Saturday: Patient reports completing dialysis on Monday.   He is euvolemic with no emergent indication for HD.  Cont HD in house  Paroxysmal atrial fibrillation: In sinus rhythm with rate controlled on admission.  He states that he was told to decrease his Eliquis to once daily. -Continue Eliquis 2.5 mg daily -Holding Coreg as above  COPD: Chronic and stable.  Continue Trelegy Ellipta and Spiriva.  CAD s/p CABG: Chronic and stable, denies any chest pain.  High-sensitivity troponin I minimally elevated at 48 and stable on repeat at 52.  EKG without acute changes.   HFpEF chronic EF  50-55%.  Stable, volume control with dialysis.  Hypertension: Holding home antihypertensives due to orthostatic hypotension. BP stable--Dr Kolluru to resume BP meds  Hyperlipidemia: Continue atorvastatin.  History of CVA: Continue aspirin and statin.  OSA: Continue CPAP nightly.  Nutrition Status: Nutrition Problem: Severe Malnutrition Etiology: chronic illness(COPD, ESRD on HD, etoh abuse) Signs/Symptoms: severe fat depletion, severe muscle depletion Interventions: Nepro shake, MVI, Liberalize Diet  DVT prophylaxis: Subcutaneous heparin Code Status: Full code, confirmed with patient Family Communication: Discussed with patient, he has discussed with family Disposition Plan: From home, likely discharge to home pending improvement in orthostatic hypotension and PT/OT eval. Pt's BP meds needs to be adjusted for to prevent hypotension Consults called: None  TOTAL TIME TAKING CARE OF THIS PATIENT: *35* minutes.  >50% time spent on counselling and coordination of care  Note: This dictation was prepared with Dragon dictation along with smaller phrase technology. Any transcriptional errors that result from this process are unintentional.  Fritzi Mandes M.D    [facility]   CC: Primary care physician; Verl Bangs, FNPPatient ID: Hyman Bower, male   DOB: 1961-02-02, 59 y.o.  MRN: [id number]

## 2019-12-19 NOTE — Progress Notes (Signed)
[facility]  ROUNDING NOTE   Subjective:   [name] admitted to [facility] on 12/18/2019 for Orthostasis [I95.1] ESRD (end stage renal disease) on dialysis ([place]) [N18.6, Z99.2] Syncope due to orthostatic hypotension [I95.1] Syncope, unspecified syncope type [R55] Hypotension [I95.9]  Placed on hemodialysis treatment. Tolerating treatment well.     HEMODIALYSIS FLOWSHEET:  Blood Flow Rate (mL/min): 400 mL/min Arterial Pressure (mmHg): -180 mmHg Venous Pressure (mmHg): 110 mmHg Transmembrane Pressure (mmHg): 50 mmHg Ultrafiltration Rate (mL/min): 570 mL/min Dialysate Flow Rate (mL/min): 600 ml/min Conductivity: Machine : 14 Conductivity: Machine : 14 Dialysis Fluid Bolus: Normal Saline Bolus Amount (mL): 250 mL    Objective:  Vital signs in last 24 hours:  Temp:  [97.5 F (36.4 C)-98.8 F (37.1 C)] 97.5 F (36.4 C) (04/07 1245) Pulse Rate:  [69-80] 77 (04/07 1415) Resp:  [11-19] 11 (04/07 1415) BP: (112-153)/(64-87) 134/67 (04/07 1415) SpO2:  [95 %-100 %] 100 % (04/07 1205) Weight:  [65.6 kg-69.3 kg] 69.3 kg (04/07 0430)  Weight change:  Filed Weights   12/18/19 2106 12/19/19 0430  Weight: 65.6 kg 69.3 kg    Intake/Output: No intake/output data recorded.   Intake/Output this shift:  No intake/output data recorded.  Physical Exam: General: NAD,   Head: Normocephalic, atraumatic. Moist oral mucosal membranes  Eyes: Anicteric, PERRL  Neck: Supple, trachea midline  Lungs:  Clear to auscultation  Heart: Regular rate and rhythm  Abdomen:  Soft, nontender,   Extremities:  no peripheral edema.  Neurologic: Nonfocal, moving all four extremities  Skin: No lesions  Access: RIJ permcath, left AVF maturing    Basic Metabolic Panel: Recent Labs  Lab 12/18/19 2103 12/19/19 0521  NA 134* 135  K 3.8 4.1  CL 97* 98  CO2 25 23  GLUCOSE 104* 82  BUN 27* 28*  CREATININE 6.31* 6.88*  CALCIUM 9.0 8.9    Liver Function Tests: Recent Labs  Lab  12/18/19 2103  AST 17  ALT 9  ALKPHOS 79  BILITOT 0.5  PROT 6.5  ALBUMIN 2.5*   Recent Labs  Lab 12/18/19 2103  LIPASE 45   No results for input(s): AMMONIA in the last 168 hours.  CBC: Recent Labs  Lab 12/18/19 2103 12/19/19 0521  WBC 9.0 9.5  HGB 9.2* 8.8*  HCT 27.4* 26.0*  MCV 107.0* 105.3*  PLT 302 270    Cardiac Enzymes: Recent Labs  Lab 12/18/19 2103  CKTOTAL 66    BNP: Invalid input(s): POCBNP  CBG: No results for input(s): GLUCAP in the last 168 hours.  Microbiology: Results for orders placed or performed during the hospital encounter of 12/18/19  SARS CORONAVIRUS 2 (TAT 6-24 HRS) Nasopharyngeal Nasopharyngeal Swab     Status: None   Collection Time: 12/19/19  1:02 AM   Specimen: Nasopharyngeal Swab  Result Value Ref Range Status   SARS Coronavirus 2 NEGATIVE NEGATIVE Final    Comment: (NOTE) SARS-CoV-2 target nucleic acids are NOT DETECTED. The SARS-CoV-2 RNA is generally detectable in upper and lower respiratory specimens during the acute phase of infection. Negative results do not preclude SARS-CoV-2 infection, do not rule out co-infections with other pathogens, and should not be used as the sole basis for treatment or other patient management decisions. Negative results must be combined with clinical observations, patient history, and epidemiological information. The expected result is Negative. Fact Sheet for Patients: SugarRoll.be Fact Sheet for Healthcare Providers: https://www.woods-mathews.com/ This test is not yet approved or cleared by the [place] FDA and  has been authorized for detection and/or diagnosis of SARS-CoV-2 by FDA under an Emergency Use Authorization (EUA). This EUA will remain  in effect (meaning this test can be used) for the duration of the COVID-19 declaration under Section 56 4(b)(1) of the Act, 21 U.S.C. section 360bbb-3(b)(1), unless the authorization is terminated  or revoked sooner. Performed at [facility], [place] [address] [place], [place] [address]     Coagulation Studies: No results for input(s): LABPROT, INR in the last 72 hours.  Urinalysis: No results for input(s): COLORURINE, LABSPEC, PHURINE, GLUCOSEU, HGBUR, BILIRUBINUR, KETONESUR, PROTEINUR, UROBILINOGEN, NITRITE, LEUKOCYTESUR in the last 72 hours.  Invalid input(s): APPERANCEUR    Imaging: CT Head Wo Contrast  Result Date: 12/18/2019 CLINICAL DATA:  Head trauma. EXAM: CT HEAD WITHOUT CONTRAST TECHNIQUE: Contiguous axial images were obtained from the base of the skull through the vertex without intravenous contrast. COMPARISON:  CT head dated 05/28/2019. FINDINGS: Brain: No evidence of acute infarction, hemorrhage, hydrocephalus, extra-axial collection or mass lesion/mass effect. Atrophy and chronic microvascular ischemic changes. There is an old lacunar infarct involving the left thalamus. Vascular: No hyperdense vessel or unexpected calcification. Skull: Normal. Negative for fracture or focal lesion. Sinuses/Orbits: No acute finding. Other: None. IMPRESSION: No acute intracranial pathology. Electronically Signed   By: Constance Holster M.D.   On: 12/18/2019 21:57   DG Chest Port 1 View  Result Date: 12/18/2019 CLINICAL DATA:  Syncope EXAM: PORTABLE CHEST 1 VIEW COMPARISON:  09/24/2019 FINDINGS: Right dialysis catheter is unchanged. Prior CABG. Chronic changes in the lungs. Scarring in the lung bases. Heart is borderline in size. No acute confluent opacities or effusions. No acute bony abnormality. IMPRESSION: Chronic changes in the lungs. Bibasilar scarring. No active disease. Electronically Signed   By: Rolm Baptise M.D.   On: 12/18/2019 23:24     Medications:    . aspirin EC  81 mg Oral Daily  . atorvastatin  20 mg Oral BH-q7a  . Chlorhexidine Gluconate Cloth  6 each Topical Q0600  . epoetin (EPOGEN/PROCRIT) injection  10,000 Units Intravenous Q M,W,F-HD  . feeding  supplement (NEPRO CARB STEADY)  237 mL Oral BID BM  . fluticasone furoate-vilanterol  1 puff Inhalation Daily   And  . umeclidinium bromide  1 puff Inhalation Daily  . gabapentin  100 mg Oral TID  . heparin injection (subcutaneous)  5,000 Units Subcutaneous Q8H  . multivitamin  1 tablet Oral QHS  . [START ON 12/20/2019] multivitamin with minerals  1 tablet Oral Daily  . sodium chloride flush  3 mL Intravenous Q12H   acetaminophen **OR** acetaminophen, ondansetron **OR** ondansetron (ZOFRAN) IV  Assessment/ Plan:  [name] is a 59 y.o. white male with end stage renal disease on hemodialysis secondary to IgA nephropathy, rheumatoid arthritis, atrial fibrillation, coronary artery disease status post CABG, congestive heart failure who is admitted to [facility] on 12/18/2019 for Orthostasis [I95.1] ESRD (end stage renal disease) on dialysis ([place]) [N18.6, Z99.2] Syncope due to orthostatic hypotension [I95.1] Syncope, unspecified syncope type [R55] Hypotension [I95.9]  [facility] MWF [facility] RIJ 65kg  1. End Stage Renal Disease: seen and examined on hemodialysis. Tolerating treatment well.   2. Hypertension: history of difficult to control blood pressures. Home regimen of amlodipine, irbesartan, furosemide, clonidine, hydralazine Holding all blood pressure medications currently.  - will discontinue gabapentin as well.   3. Anemia of chronic kidney disease: hemoglobin 8.8. macrocytic. History of alcohol abuse.  - EPO with HD treatment  4. Secondary Hyperparathyroidism: with  hyperphosphatemia Binders: sevelamer and auryxia.     LOS: 0 Maud Rubendall 4/7/20212:27 PM

## 2019-12-19 NOTE — Plan of Care (Signed)
  Problem: Clinical Measurements: Goal: Ability to maintain clinical measurements within normal limits will improve Outcome: Progressing Goal: Will remain free from infection Outcome: Progressing Goal: Respiratory complications will improve Outcome: Progressing   Problem: Activity: Goal: Risk for activity intolerance will decrease Outcome: Progressing   Problem: Coping: Goal: Level of anxiety will decrease Outcome: Progressing   Problem: Safety: Goal: Ability to remain free from injury will improve Outcome: Progressing

## 2019-12-19 NOTE — Progress Notes (Signed)
Hemodialysis patient known at [facility]) MWF 5:45am. Patient normally transports self. Please contact me with any dialysis placement concerns.

## 2019-12-19 NOTE — H&P (Signed)
History and Physical    Daryl Weeks:[id number] DOB: 06-04-1961 DOA: 12/18/2019  PCP: Verl Bangs, FNP  Patient coming from: Home via EMS  I have personally briefly reviewed patient's old medical records in [place]  Chief Complaint: Lightheadedness/fall  HPI: Daryl Weeks is a 59 y.o. male with medical history significant for ESRD on HD MWF and sometimes Saturday, CAD s/p CABG, paroxysmal atrial fibrillation on Eliquis, COPD, HFpEF, hypertension, hyperlipidemia, history of CVA, anemia of chronic disease, gout, and OSA who presents to the ED for evaluation after a fall at home.  Patient has somewhat dysarthric speech chronically from prior CVA otherwise is providing full history.  Patient states that he was getting other car to return to go back into his home after going to the pharmacy when he became very lightheaded and fell down to the ground.  He denies loss of consciousness or injury to his head.  He thinks he might have landed on his right side.  He crawled into his house and onto the couch.  His son called EMS for him.  He denies any associated chest pain, palpitations, dyspnea, cough, nausea, vomiting, diarrhea, abdominal pain.  He states he makes a small amount of urine still.  He says he did take his morning blood pressure medications prior to the event.  He also reports being followed by speech therapy for his dysarthria as well as symptoms of feeling as food sometimes getting stuck at the level of his chest when he swallows.  ED Course:  In the ED initial vitals showed BP 112/66, pulse 69, RR 17, temp 98.8 Fahrenheit, SPO2 95% on room air.  Orthostatic vital signs were positive with drop in SBP from 1 56-1 01 from a lying to standing position.  Labs are notable for WBC 9.0, hemoglobin 9.2, platelets 302,000, sodium 134, potassium 3.8, bicarb 25, BUN 27, creatinine 6.31, serum glucose 104, high-sensitivity troponin I 48 and 52, LFTs within normal limits, albumin 2.5,  lipase 45, serum ethanol level undetectable, CK 66.  SARS-CoV-2 PCR test is ordered and pending.  CT head without contrast is negative for acute intracranial pathology.  Portable chest x-ray showed right dialysis catheter in place, prior CABG changes, chronic lung changes with bibasilar scarring.  No active disease.  Patient was given 500 cc normal saline and the hospitalist service was consulted to admit for further evaluation and management.  Review of Systems: All systems reviewed and are negative except as documented in history of present illness above.   Past Medical History:  Diagnosis Date   (HFpEF) heart failure with preserved ejection fraction ([place])    a. 05/2019 Echo: EF 50-55%, diast dysfxn, RVSP 56.72mmHg, Sev dil LA. Mildly dil PA.   Anemia    Anxiety    Arthritis    Colon polyps    COPD (chronic obstructive pulmonary disease) (HCC)    Coronary artery disease    a. 10/2013 NSTEMI/Cath: Severe 3VD-->CABG x 4 @ [facility] 01/2014 (LIMA->LAD, VG->OM1->OM2, VG->RCA); b. 11/2014 MV: No isch/infarct; c. 05/2019 MV: EF 40% (50-55 by echo), small,mild, fixed apical defect - ? atten vs infarct. No ischemia->low risk.   ESRD (end stage renal disease) ([place])    a. MWFSat HD   ETOH abuse    a. 2 beers/night.   GERD (gastroesophageal reflux disease)    History of pneumonia    Hyperlipidemia    Hypertension    IgA nephropathy    Lower GI bleed    a. Due  to colon polyps. Status post resection of 14 polyps   Non-ST elevation MI (NSTEMI) ([place]) 2016   PAF (paroxysmal atrial fibrillation) ([place])    a. Dx 05/2019. CHA2DS2VASc = 3-->Eliquis/Amio.   Pneumonia 08/2019   Rheumatoid arthritis ([place])    Shortness of breath    WITH EXERTION   Sleep apnea    Tobacco abuse     Past Surgical History:  Procedure Laterality Date   A/V FISTULAGRAM Left 10/28/2016   Procedure: A/V Fistulagram;  Surgeon: [name], MD;  Location: [place] [facility];  Service: Cardiovascular;   Laterality: Left;   A/V FISTULAGRAM Left 12/07/2018   Procedure: A/V FISTULAGRAM;  Surgeon: [name], MD;  Location: [facility] [place] [facility];  Service: Cardiovascular;  Laterality: Left;   A/V FISTULAGRAM N/A 06/04/2019   Procedure: A/V Fistulagram- LEFT;  Surgeon: [name], MD;  Location: [place] [facility];  Service: Cardiovascular;  Laterality: N/A;   A/V SHUNT INTERVENTION N/A 10/28/2016   Procedure: A/V Shunt Intervention;  Surgeon: [name], MD;  Location: [place] [facility];  Service: Cardiovascular;  Laterality: N/A;   A/V SHUNTOGRAM Left 02/01/2019   Procedure: A/V SHUNTOGRAM;  Surgeon: [name], MD;  Location: [facility];  Service: Cardiovascular;  Laterality: Left;   AV FISTULA PLACEMENT     AV FISTULA PLACEMENT Left 11/30/2019   Procedure: ARTERIOVENOUS (AV) FISTULA CREATION ( BRACHIAL CEPHALIC );  Surgeon: Katha Cabal, MD;  Location: [facility];  Service: Vascular;  Laterality: Left;   CARDIAC CATHETERIZATION     RCA 90% and calcified mid LAD 80% Stenosis   CORONARY ARTERY BYPASS GRAFT N/A 11/29/2013   Procedure: CORONARY ARTERY BYPASS GRAFTING (CABG) x 4 using endoscopically harvested right saphenous vein and left internal mammary artery;  Surgeon: [name], MD;  Location: [place];  Service: Open Heart Surgery;  Laterality: N/A;   dialysis catheterr  2/15   DIALYSIS/PERMA CATHETER INSERTION N/A 07/16/2019   Procedure: DIALYSIS/PERMA CATHETER INSERTION WITH VAC CHANGE UNDER SEDATION;  Surgeon: [name], MD;  Location: [place] [facility];  Service: Cardiovascular;  Laterality: N/A;   DIALYSIS/PERMA CATHETER INSERTION N/A 11/08/2019   Procedure: DIALYSIS/PERMA CATHETER INSERTION;  Surgeon: [name], MD;  Location: [place] [facility];  Service: Cardiovascular;  Laterality: N/A;   DIALYSIS/PERMA CATHETER REMOVAL N/A 02/15/2019   Procedure: DIALYSIS/PERMA CATHETER REMOVAL;  Surgeon: [name], MD;  Location: [place] [facility];   Service: Cardiovascular;  Laterality: N/A;   INSERTION OF DIALYSIS CATHETER N/A 12/14/2018   Procedure: INSERTION OF DIALYSIS CATHETER ( PERMCATH );  Surgeon: [name], MD;  Location: [facility];  Service: Vascular;  Laterality: N/A;   INTRAOPERATIVE TRANSESOPHAGEAL ECHOCARDIOGRAM N/A 11/29/2013   Procedure: INTRAOPERATIVE TRANSESOPHAGEAL ECHOCARDIOGRAM;  Surgeon: [name], MD;  Location: [facility] [facility];  Service: Open Heart Surgery;  Laterality: N/A;   LIGATION OF ARTERIOVENOUS  FISTULA Left 07/13/2019   Procedure: LIGATION OF ARTERIOVENOUS  FISTULA;  Surgeon: [name], MD;  Location: [facility];  Service: Vascular;  Laterality: Left;   PERIPHERAL VASCULAR CATHETERIZATION N/A 06/12/2015   Procedure: A/V Shuntogram/Fistulagram;  Surgeon: [name], MD;  Location: [place] [facility];  Service: Cardiovascular;  Laterality: N/A;   PERIPHERAL VASCULAR CATHETERIZATION Left 06/12/2015   Procedure: A/V Shunt Intervention;  Surgeon: [name], MD;  Location: [facility] [facility];  Service: Cardiovascular;  Laterality: Left;   RENAL BIOPSY Left 14   REVISON OF  ARTERIOVENOUS FISTULA Left 12/14/2018   Procedure: REVISON OF ARTERIOVENOUS FISTULA;  Surgeon: [name], MD;  Location: [facility];  Service: Vascular;  Laterality: Left;   UPPER EXTREMITY VENOGRAPHY Bilateral 09/18/2019   Procedure: UPPER EXTREMITY VENOGRAPHY;  Surgeon: Katha Cabal, MD;  Location: [facility] [facility];  Service: Cardiovascular;  Laterality: Bilateral;    Social History:  reports that he has been smoking cigarettes. He has a 16.00 pack-year smoking history. He has never used smokeless tobacco. He reports current alcohol use of about 7.0 standard drinks of alcohol per week. He reports that he does not use drugs.  Allergies  Allergen Reactions   Lisinopril Hives    Family History  Problem Relation Age of Onset   Heart disease Father    Heart disease Brother    Healthy Sister    Stroke Neg Hx       Prior to Admission medications   Medication Sig Start Date End Date Taking? Authorizing Provider  amiodarone (PACERONE) 200 MG tablet Take 200 mg by mouth every morning.  09/02/19   [provider]  amLODipine (NORVASC) 10 MG tablet Take 10 mg by mouth at bedtime. 11/24/19   [provider]  apixaban (ELIQUIS) 2.5 MG TABS tablet Take 2.5 mg by mouth daily.    [provider]  aspirin EC 81 MG tablet Take 1 tablet (81 mg total) by mouth daily. 11/06/19   Dunn, Areta Haber, PA-C  atorvastatin (LIPITOR) 20 MG tablet take 1 tablet by mouth at bedtime Patient taking differently: Take 20 mg by mouth every morning.  04/25/14   Minna Merritts, MD  AURYXIA 1 GM 210 MG(Fe) tablet Take 420 mg by mouth 3 (three) times daily with meals.     [provider]  calcium carbonate (TUMS - DOSED IN MG ELEMENTAL CALCIUM) 500 MG chewable tablet Chew 1 tablet by mouth daily as needed for indigestion or heartburn.     [provider]  carvedilol (COREG) 12.5 MG tablet Take 1 tablet (12.5 mg total) by mouth 2 (two) times daily with a meal. 11/06/19   Dunn, Areta Haber, PA-C  cinacalcet (SENSIPAR) 30 MG tablet Take 30 mg by mouth daily with supper.     [provider]  cloNIDine (CATAPRES) 0.2 MG tablet Take 1 tablet (0.2 mg total) by mouth 3 (three) times daily. 06/05/19   Nicholes Mango, MD  docusate sodium (COLACE) 100 MG capsule Take 1 capsule (100 mg total) by mouth 2 (two) times daily as needed for mild constipation. 06/05/19   Gouru, Illene Silver, MD  Fluticasone Propionate, Inhal, (FLOVENT DISKUS) 100 MCG/BLIST AEPB Inhale 1 puff into the lungs 2 (two) times daily. Patient taking differently: Inhale 1 puff into the lungs every morning.  10/17/19   Tyler Pita, MD  Fluticasone-Umeclidin-Vilant (TRELEGY ELLIPTA) 100-62.5-25 MCG/INH AEPB Inhale 1 puff into the lungs daily. Patient taking differently: Inhale 1 puff into the lungs every other day.  10/16/19   Tyler Pita,  MD  furosemide (LASIX) 80 MG tablet Take 80 mg by mouth 2 (two) times daily.     [provider]  gabapentin (NEURONTIN) 100 MG capsule Take 100 mg by mouth 3 (three) times daily.  01/14/18   [provider]  hydrALAZINE (APRESOLINE) 100 MG tablet Take 50 mg by mouth 3 (three) times daily.     [provider]  HYDROcodone-acetaminophen (NORCO) 5-325 MG tablet Take 1-2 tablets by mouth every 6 (six) hours as needed for moderate pain  or severe pain. 11/30/19   Schnier, Dolores Lory, MD  loratadine (CLARITIN) 10 MG tablet Take 1 tablet (10 mg total) by mouth daily. Patient taking differently: Take 10 mg by mouth every morning.  06/06/19   Gouru, Illene Silver, MD  ondansetron (ZOFRAN-ODT) 4 MG disintegrating tablet Take 4 mg by mouth every 8 (eight) hours as needed for nausea.     [provider]  SPIRIVA HANDIHALER 18 MCG inhalation capsule 1 capsule daily. 12/04/19   [provider]  umeclidinium-vilanterol (ANORO ELLIPTA) 62.5-25 MCG/INH AEPB Inhale 1 puff into the lungs daily. 10/17/19   Tyler Pita, MD  zolpidem (AMBIEN) 10 MG tablet Take 10 mg by mouth at bedtime as needed for sleep.    [provider]    Physical Exam: Vitals:   12/18/19 2106 12/19/19 0101  BP: 112/66 137/81  Pulse: 69 73  Resp: 17 14  Temp: 98.8 F (37.1 C)   TempSrc: Oral   SpO2: 95% 96%  Weight: 65.6 kg    Constitutional: Disheveled man resting supine in bed, NAD, calm, comfortable Eyes: PERRL, EOMI, lids and conjunctivae normal ENMT: Mucous membranes are dry. Posterior pharynx clear of any exudate or lesions. Neck: normal, supple, no masses. Respiratory: clear to auscultation bilaterally, no wheezing, no crackles. Normal respiratory effort. No accessory muscle use.  Cardiovascular: Regular rate and rhythm, systolic flow murmur present with accentuated P2. No extremity edema. 2+ pedal pulses.  HD cath in place right chest wall.  LUE aVF with palpable thrill. Abdomen: no  tenderness, no masses palpated. No hepatosplenomegaly. Bowel sounds positive.  Musculoskeletal: no clubbing / cyanosis.  Gouty tophi right elbow. Good ROM, no contractures. Normal muscle tone.  Skin: no rashes, lesions, ulcers. No induration.  Well-healed sternotomy scar. Neurologic: Some dysarthric (chronic) speech otherwise CN 2-12 grossly intact. Sensation intact, Strength 5/5 in all 4.  Psychiatric: Normal judgment and insight. Alert and oriented x 3. Normal mood.   Labs on Admission: I have personally reviewed following labs and imaging studies  CBC: Recent Labs  Lab 12/18/19 2103  WBC 9.0  HGB 9.2*  HCT 27.4*  MCV 107.0*  PLT 426   Basic Metabolic Panel: Recent Labs  Lab 12/18/19 2103  NA 134*  K 3.8  CL 97*  CO2 25  GLUCOSE 104*  BUN 27*  CREATININE 6.31*  CALCIUM 9.0   GFR: Estimated Creatinine Clearance: 12.1 mL/min (A) (by C-G formula based on SCr of 6.31 mg/dL (H)). Liver Function Tests: Recent Labs  Lab 12/18/19 2103  AST 17  ALT 9  ALKPHOS 79  BILITOT 0.5  PROT 6.5  ALBUMIN 2.5*   Recent Labs  Lab 12/18/19 2103  LIPASE 45   No results for input(s): AMMONIA in the last 168 hours. Coagulation Profile: No results for input(s): INR, PROTIME in the last 168 hours. Cardiac Enzymes: Recent Labs  Lab 12/18/19 2103  CKTOTAL 66   BNP (last 3 results) No results for input(s): PROBNP in the last 8760 hours. HbA1C: No results for input(s): HGBA1C in the last 72 hours. CBG: No results for input(s): GLUCAP in the last 168 hours. Lipid Profile: No results for input(s): CHOL, HDL, LDLCALC, TRIG, CHOLHDL, LDLDIRECT in the last 72 hours. Thyroid Function Tests: No results for input(s): TSH, T4TOTAL, FREET4, T3FREE, THYROIDAB in the last 72 hours. Anemia Panel: No results for input(s): VITAMINB12, FOLATE, FERRITIN, TIBC, IRON, RETICCTPCT in the last 72 hours. Urine analysis:    Component Value Date/Time   [employer] 01/12/2014 [phone]  COLORURINE  YELLOW 11/27/2013 1309   APPEARANCEUR Cloudy 01/12/2014 0513   LABSPEC 1.028 01/12/2014 0513   PHURINE 5.0 01/12/2014 0513   PHURINE 7.5 11/27/2013 1309   GLUCOSEU 50 mg/dL 01/12/2014 0513   HGBUR 1+ 01/12/2014 0513   HGBUR SMALL (A) 11/27/2013 1309   BILIRUBINUR Negative 01/12/2014 0513   KETONESUR Trace 01/12/2014 0513   KETONESUR NEGATIVE 11/27/2013 1309   PROTEINUR >=500 01/12/2014 0513   PROTEINUR >300 (A) 11/27/2013 1309   UROBILINOGEN 0.2 11/27/2013 1309   NITRITE Negative 01/12/2014 0513   NITRITE NEGATIVE 11/27/2013 1309   LEUKOCYTESUR 3+ 01/12/2014 0513    Radiological Exams on Admission: CT Head Wo Contrast  Result Date: 12/18/2019 CLINICAL DATA:  Head trauma. EXAM: CT HEAD WITHOUT CONTRAST TECHNIQUE: Contiguous axial images were obtained from the base of the skull through the vertex without intravenous contrast. COMPARISON:  CT head dated 05/28/2019. FINDINGS: Brain: No evidence of acute infarction, hemorrhage, hydrocephalus, extra-axial collection or mass lesion/mass effect. Atrophy and chronic microvascular ischemic changes. There is an old lacunar infarct involving the left thalamus. Vascular: No hyperdense vessel or unexpected calcification. Skull: Normal. Negative for fracture or focal lesion. Sinuses/Orbits: No acute finding. Other: None. IMPRESSION: No acute intracranial pathology. Electronically Signed   By: Constance Holster M.D.   On: 12/18/2019 21:57   DG Chest Port 1 View  Result Date: 12/18/2019 CLINICAL DATA:  Syncope EXAM: PORTABLE CHEST 1 VIEW COMPARISON:  09/24/2019 FINDINGS: Right dialysis catheter is unchanged. Prior CABG. Chronic changes in the lungs. Scarring in the lung bases. Heart is borderline in size. No acute confluent opacities or effusions. No acute bony abnormality. IMPRESSION: Chronic changes in the lungs. Bibasilar scarring. No active disease. Electronically Signed   By: Rolm Baptise M.D.   On: 12/18/2019 23:24    EKG: Independently reviewed.   Sinus rhythm, first-degree AV block, QTC 475. When compared to prior bradycardia resolved and QT prolongation also resolved.  Assessment/Plan Active Problems:   Coronary artery disease   Hyperlipidemia   Hypertensive disorder   Chronic diastolic heart failure (HCC)   ESRD on dialysis (HCC)   COPD (chronic obstructive pulmonary disease) (HCC)   Orthostatic hypotension   PAF (paroxysmal atrial fibrillation) [facility])   Sleep apnea  Daryl Weeks is a 59 y.o. male with medical history significant for ESRD on HD MWF and sometimes Saturday, CAD s/p CABG, paroxysmal atrial fibrillation on Eliquis, COPD, HFpEF, hypertension, hyperlipidemia, history of CVA, anemia of chronic disease, gout, and OSA who is admitted with orthostatic hypotension.   Fall due to orthostatic hypotension: Without significant injury.  Likely secondary to antihypertensive and decreased oral intake from dysphagia type symptoms.  He is being given 500 cc normal saline in the ED. -Hold further fluids for now -Hold home amlodipine, Coreg, clonidine, Lasix, hydralazine -Monitor on telemetry -Repeat orthostatic vital signs in the morning -PT/OT eval  ESRD on HD MWF and sometimes Saturday: Patient reports completing dialysis on Monday.  He is euvolemic with no emergent indication for HD.  Will need nephrology consultation if he remains in hospital for usual dialysis on Wednesday.  Paroxysmal atrial fibrillation: In sinus rhythm with rate controlled on admission.  He states that he was told to decrease his Eliquis to once daily. -Continue Eliquis 2.5 mg daily -Holding Coreg as above  COPD: Chronic and stable.  Continue Trelegy Ellipta and Spiriva.  CAD s/p CABG: Chronic and stable, denies any chest pain.  High-sensitivity troponin I minimally elevated at 48 and stable on repeat at  52.  EKG without acute changes.   HFpEF: EF 50-55%.  Stable, volume control with dialysis.  Hypertension: Holding home antihypertensives due  to orthostatic hypotension.  Hyperlipidemia: Continue atorvastatin.  History of CVA: Continue aspirin and statin.  OSA: Continue CPAP nightly.  DVT prophylaxis: Subcutaneous heparin Code Status: Full code, confirmed with patient Family Communication: Discussed with patient, he has discussed with family Disposition Plan: From home, likely discharge to home pending improvement in orthostatic hypotension and PT/OT eval. Consults called: None Admission status: Observation   Zada Finders MD [facility]  If 7PM-7AM, please contact night-coverage [email or web address]  12/19/2019, 1:10 AM

## 2019-12-20 DIAGNOSIS — J431 Panlobular emphysema: Secondary | ICD-10-CM

## 2019-12-20 DIAGNOSIS — I5032 Chronic diastolic (congestive) heart failure: Secondary | ICD-10-CM

## 2019-12-20 LAB — GLUCOSE, CAPILLARY: Glucose-Capillary: 75 mg/dL (ref 70–99)

## 2019-12-20 MED ORDER — CARVEDILOL 25 MG PO TABS: 25.0000 mg | ORAL_TABLET | Freq: Two times a day (BID) | ORAL | Status: DC

## 2019-12-20 MED ORDER — RENA-VITE PO TABS: 1.0000 | ORAL_TABLET | Freq: Every day | ORAL | 0 refills | Status: AC

## 2019-12-20 MED ORDER — APIXABAN 2.5 MG PO TABS
2.5000 mg | ORAL_TABLET | Freq: Every day | ORAL | Status: DC
  Administered 2019-12-20: 2.5 mg via ORAL
  Filled 2019-12-20: qty 1

## 2019-12-20 MED ORDER — CARVEDILOL 12.5 MG PO TABS: 25.0000 mg | ORAL_TABLET | Freq: Two times a day (BID) | ORAL | 1 refills | Status: DC

## 2019-12-20 MED ORDER — HYDRALAZINE HCL 100 MG PO TABS: 50.0000 mg | ORAL_TABLET | Freq: Three times a day (TID) | ORAL | Status: DC

## 2019-12-20 NOTE — Discharge Instructions (Signed)
Resume your HD MWF as before

## 2019-12-20 NOTE — TOC Initial Note (Signed)
Transition of Care [facility]) - Initial/Assessment Note    Patient Details  Name: Daryl Weeks MRN: [id number] Date of Birth: 1960/12/19  Transition of Care [facility]) CM/SW Contact:    Su Hilt, RN Phone Number: 12/20/2019, 8:59 AM  Clinical Narrative:                  Met with the patient at the bedside to discuss DC plan and needs He lives alone He is open with [facility] already He will benefit from a 3 in 1 and a RW I notified Brad with [employer], I notified Cassie with [facility]  that the patient is admitted He is open with [facility] will benefit from PT and OT RN, ST at DC He is up to date with his PCP, He gets his medication at [facility] and can afford them.   Expected Discharge Plan: [place] Barriers to Discharge: Continued Medical Work up   Patient Goals and CMS Choice Patient states their goals for this hospitalization and ongoing recovery are:: Go home      Expected Discharge Plan and Services Expected Discharge Plan: [place]   Discharge Planning Services: CM Consult   Living arrangements for the past 2 months: Single Family Home                 DME Arranged: 3-N-1, [place] rolling DME Agency: [facility] Date DME Agency Contacted: 12/20/19 Time DME Agency Contacted: [phone] Representative spoke with at DME Agency: [place] Arranged: PT, OT [place] Agency: [facility] [place] Date [place]: 12/20/19 Time [place]: [phone] Representative spoke with at [place] Arrangements/Services Living arrangements for the past 2 months: [place] with:: Self Patient language and need for interpreter reviewed:: Yes Do you feel safe going back to the place where you live?: Yes      Need for Family Participation in Patient Care: No (Comment) Care giver support system in place?: Yes (comment) Current home services: DME(cane) Criminal Activity/Legal Involvement Pertinent to Current  Situation/Hospitalization: No - Comment as needed  Activities of Daily Living Home Assistive Devices/Equipment: Eyeglasses ADL Screening (condition at time of admission) Patient's cognitive ability adequate to safely complete daily activities?: Yes Is the patient deaf or have difficulty hearing?: No Does the patient have difficulty seeing, even when wearing glasses/contacts?: No Does the patient have difficulty concentrating, remembering, or making decisions?: No Patient able to express need for assistance with ADLs?: Yes Does the patient have difficulty dressing or bathing?: No Independently performs ADLs?: Yes (appropriate for developmental age) Does the patient have difficulty walking or climbing stairs?: No Weakness of Legs: None  Permission Sought/Granted   Permission granted to share information with : Yes, Verbal Permission Granted              Emotional Assessment Appearance:: Appears stated age Attitude/Demeanor/Rapport: Engaged Affect (typically observed): Appropriate Orientation: : Oriented to Self, Oriented to Place, Oriented to  Time, Oriented to Situation Alcohol / Substance Use: Not Applicable Psych Involvement: No (comment)  Admission diagnosis:  Orthostasis [I95.1] ESRD (end stage renal disease) on dialysis ([place]) [N18.6, Z99.2] Syncope due to orthostatic hypotension [I95.1] Syncope, unspecified syncope type [R55] Hypotension [I95.9] Patient Active Problem List   Diagnosis Date Noted  . Orthostatic hypotension 12/19/2019  . Protein-calorie malnutrition, severe 12/19/2019  . Hypotension 12/19/2019  . PAF (paroxysmal atrial fibrillation) ([place])   . Sleep apnea   . Gouty tophi of joint 12/06/2019  .  Cough with hemoptysis 09/24/2019  . Atrial fibrillation, chronic ([place]) 09/24/2019  . Community acquired pneumonia 09/24/2019  . Hemoptysis 09/24/2019  . Complication of AV dialysis fistula 07/13/2019  . Left lower lobe pneumonia 05/28/2019  . Complication of  vascular access for dialysis 11/28/2018  . COPD (chronic obstructive pulmonary disease) ([place]) 12/05/2017  . ESRD on dialysis ([place]) 10/05/2016  . IgA nephropathy 11/05/2014  . S/P CABG x 4 11/29/2013  . Chronic diastolic heart failure ([place]) 11/16/2013  . Coronary artery disease   . Hyperlipidemia   . Hypertensive disorder   . Dyspnea 11/20/2012  . Tobacco abuse    PCP:  Verl Bangs, FNP Pharmacy:   [place], [address] [place] [address] Phone: [phone] Fax: [place], [place] [address] [place] [address] Phone: [phone] Fax: [phone]     Social Determinants of Health (SDOH) Interventions    Readmission Risk Interventions Readmission Risk Prevention Plan 09/25/2019 07/17/2019 06/05/2019  Transportation Screening Complete Complete Complete  PCP or Specialist Appt within 3-5 Days Complete Complete Complete  HRI or Home Care Consult Complete Complete -  Social Work Consult for [place] Planning/Counseling - Complete -  Palliative Care Screening Not Applicable Not Applicable Not Applicable  Medication Review (RN Care Manager) Complete Complete Complete  Some recent data might be hidden

## 2019-12-20 NOTE — Discharge Summary (Signed)
[facility] NAME: Ivon Oelkers    MR#:  [id number]  DATE OF BIRTH:  Dec 04, 1960  DATE OF ADMISSION:  12/18/2019 ADMITTING PHYSICIAN: Fritzi Mandes, MD  DATE OF DISCHARGE: 12/20/2019  PRIMARY CARE PHYSICIAN: Verl Bangs, FNP    ADMISSION DIAGNOSIS:  Orthostasis [I95.1] ESRD (end stage renal disease) on dialysis ([place]) [N18.6, Z99.2] Syncope due to orthostatic hypotension [I95.1] Syncope, unspecified syncope type [R55] Hypotension [I95.9]  DISCHARGE DIAGNOSIS:  Orthostatic hypotension--resolved ESRD on HD SECONDARY DIAGNOSIS:   Past Medical History:  Diagnosis Date  . (HFpEF) heart failure with preserved ejection fraction ([place])    a. 05/2019 Echo: EF 50-55%, diast dysfxn, RVSP 56.68mmHg, Sev dil LA. Mildly dil PA.  [name] Anemia   . Anxiety   . Arthritis   . Colon polyps   . COPD (chronic obstructive pulmonary disease) ([place])   . Coronary artery disease    a. 10/2013 NSTEMI/Cath: Severe 3VD-->CABG x 4 @ [facility] 01/2014 (LIMA->LAD, VG->OM1->OM2, VG->RCA); b. 11/2014 MV: No isch/infarct; c. 05/2019 MV: EF 40% (50-55 by echo), small,mild, fixed apical defect - ? atten vs infarct. No ischemia->low risk.  . ESRD (end stage renal disease) ([place])    a. MWFSat HD  . ETOH abuse    a. 2 beers/night.  [name] GERD (gastroesophageal reflux disease)   . History of pneumonia   . Hyperlipidemia   . Hypertension   . IgA nephropathy   . Lower GI bleed    a. Due to colon polyps. Status post resection of 14 polyps  . Non-ST elevation MI (NSTEMI) ([place]) 2016  . PAF (paroxysmal atrial fibrillation) ([place])    a. Dx 05/2019. CHA2DS2VASc = 3-->Eliquis/Amio.  . Pneumonia 08/2019  . Rheumatoid arthritis ([place])   . Shortness of breath    WITH EXERTION  . Sleep apnea   . Tobacco abuse     HOSPITAL COURSE:  RONON FERGER a 59 y.o.malewith medical history significant forESRD on HD MWF and sometimes Saturday, CAD s/p CABG, paroxysmal atrial fibrillation on Eliquis,  COPD, HFpEF, hypertension, hyperlipidemia, history of CVA, anemia of chronic disease, gout, and OSA who presents to the ED for evaluation after a fall at home. Patient has somewhat dysarthric speech chronically from prior CVA.  Falldue to orthostatic hypotension:--resolved -Without significant injury. Likely secondary to antihypertensive and decreased oral intake from dysphagia type symptoms. He was given 500 cc normal saline in the ED. -Hold further fluids for now -resumed Coreg,Lasix and amlodipine--Dr Kolluru aware -PT/OT eval rec HHPT -No arrhythmia reported on telemetry  ESRD on HDMWF and sometimes Saturday: Patient reports completing dialysis on Monday.  He is euvolemic with no emergent indication for HD.  resume outpatient dialysis as before  Paroxysmal atrial fibrillation: In sinus rhythm with rate controlled on admission. He states that he was told to decrease his Eliquis to once daily. -Continue Eliquis 2.5 mg daily -on Coreg  COPD: Chronic and stable.  Continue Trelegy Ellipta and Spiriva.  CAD s/p CABG: Chronic and stable, denies any chest pain. High-sensitivity troponin I minimally elevated at 48 and stable on repeat at 52. EKG without acute changes.   HFpEF chronic EF 50-55%. Stable, volume control with dialysis.  Hypertension: resumed amlodipine, Coreg, Lasix holding clonidine, hydralazine -- can be resumed as outpatient if needed by primary care physician or nephrology`  hyperlipidemia: Continue atorvastatin.  History of CVA: Continue aspirin and statin.  OSA: advised with patient to discuss with PCP for formal sleep study as outpatient.  Nutrition Status: Nutrition Problem: Severe Malnutrition Etiology: chronic illness(COPD, ESRD on HD, etoh abuse) Signs/Symptoms: severe fat depletion, severe muscle depletion Interventions: [place] shake, MVI, Liberalize Diet  DVT prophylaxis:eliquis Code Status:Full code, confirmed with  patient Family Communication:Discussed with patient, he has discussed with family Disposition Plan:From home, likely discharge to home today with [facility] Consults called:Nephrology   CONSULTS OBTAINED:  Treatment Team:  Lavonia Dana, MD  DRUG ALLERGIES:   Allergies  Allergen Reactions  . Lisinopril Hives    DISCHARGE MEDICATIONS:   Allergies as of 12/20/2019      Reactions   Lisinopril Hives      Medication List    STOP taking these medications   cloNIDine 0.2 MG tablet Commonly known as: CATAPRES   hydrALAZINE 100 MG tablet Commonly known as: APRESOLINE   levofloxacin 500 MG tablet Commonly known as: LEVAQUIN     TAKE these medications   amiodarone 200 MG tablet Commonly known as: PACERONE Take 200 mg by mouth every morning.   amLODipine 10 MG tablet Commonly known as: NORVASC Take 10 mg by mouth at bedtime.   Anoro Ellipta 62.5-25 MCG/INH Aepb Generic drug: umeclidinium-vilanterol Inhale 1 puff into the lungs daily.   aspirin EC 81 MG tablet Take 1 tablet (81 mg total) by mouth daily.   atorvastatin 20 MG tablet Commonly known as: LIPITOR take 1 tablet by mouth at bedtime What changed:   how much to take  how to take this  when to take this  additional instructions   Auryxia 1 GM 210 MG(Fe) tablet Generic drug: ferric citrate Take 420 mg by mouth 3 (three) times daily with meals.   calcium carbonate 500 MG chewable tablet Commonly known as: TUMS - dosed in mg elemental calcium Chew 1 tablet by mouth daily as needed for indigestion or heartburn.   carvedilol 12.5 MG tablet Commonly known as: COREG Take 2 tablets (25 mg total) by mouth 2 (two) times daily with a meal. What changed: how much to take   docusate sodium 100 MG capsule Commonly known as: COLACE Take 1 capsule (100 mg total) by mouth 2 (two) times daily as needed for mild constipation.   Eliquis 2.5 MG Tabs tablet Generic drug: apixaban Take 2.5 mg by mouth daily.    Flovent Diskus 100 MCG/BLIST Aepb Generic drug: Fluticasone Propionate (Inhal) Inhale 1 puff into the lungs 2 (two) times daily. What changed: when to take this   furosemide 80 MG tablet Commonly known as: LASIX Take 80 mg by mouth 2 (two) times daily.   gabapentin 100 MG capsule Commonly known as: NEURONTIN Take 100 mg by mouth 3 (three) times daily.   HYDROcodone-acetaminophen 5-325 MG tablet Commonly known as: Norco Take 1-2 tablets by mouth every 6 (six) hours as needed for moderate pain or severe pain.   loratadine 10 MG tablet Commonly known as: CLARITIN Take 1 tablet (10 mg total) by mouth daily. What changed: when to take this   multivitamin Tabs tablet Take 1 tablet by mouth at bedtime.   ondansetron 4 MG disintegrating tablet Commonly known as: ZOFRAN-ODT Take 4 mg by mouth every 8 (eight) hours as needed for nausea.   Sensipar 30 MG tablet Generic drug: cinacalcet Take 30 mg by mouth daily with supper.   sevelamer carbonate 800 MG tablet Commonly known as: RENVELA Take 800-2,400 mg by mouth See admin instructions. Take 3 tablets (2400mg ) by mouth three times daily before meals and take 1 tablet (800mg ) by mouth once daily with a  snack   Spiriva HandiHaler 18 MCG inhalation capsule Generic drug: tiotropium Place 18 mcg into inhaler and inhale daily.   Trelegy Ellipta 100-62.5-25 MCG/INH Aepb Generic drug: Fluticasone-Umeclidin-Vilant Inhale 1 puff into the lungs daily. What changed: when to take this   zolpidem 10 MG tablet Commonly known as: AMBIEN Take 10 mg by mouth at bedtime as needed for sleep.            Durable Medical Equipment  (From admission, onward)         Start     Ordered   12/20/19 0927  For home use only DME 3 n 1  Once     12/20/19 6440   12/20/19 0927  For home use only DME Walker rolling  Once    Question Answer Comment  Walker: With 5 Inch Wheels   Patient needs a walker to treat with the following condition General  weakness      12/20/19 3474          If you experience worsening of your admission symptoms, develop shortness of breath, life threatening emergency, suicidal or homicidal thoughts you must seek medical attention immediately by calling 911 or calling your MD immediately  if symptoms less severe.  You Must read complete instructions/literature along with all the possible adverse reactions/side effects for all the Medicines you take and that have been prescribed to you. Take any new Medicines after you have completely understood and accept all the possible adverse reactions/side effects.   Please note  You were cared for by a hospitalist during your hospital stay. If you have any questions about your discharge medications or the care you received while you were in the hospital after you are discharged, you can call the unit and asked to speak with the hospitalist on call if the hospitalist that took care of you is not available. Once you are discharged, your primary care physician will handle any further medical issues. Please note that NO REFILLS for any discharge medications will be authorized once you are discharged, as it is imperative that you return to your primary care physician (or establish a relationship with a primary care physician if you do not have one) for your aftercare needs so that they can reassess your need for medications and monitor your lab values. Today   SUBJECTIVE   Feels overall better  VITAL SIGNS:  Blood pressure (!) 165/77, pulse 76, temperature 97.8 F (36.6 C), temperature source Oral, resp. rate 17, weight 68.1 kg, SpO2 96 %.  I/O:    Intake/Output Summary (Last 24 hours) at 12/20/2019 0956 Last data filed at 12/19/2019 1615 Gross per 24 hour  Intake -  Output 1500 ml  Net -1500 ml    PHYSICAL EXAMINATION:  GENERAL:  59 y.o.-year-old patient lying in the bed with no acute distress.  Chronically ill appearing EYES: Pupils equal, round, reactive to  light and accommodation. No scleral icterus.  HEENT: Head atraumatic, normocephalic. Oropharynx and nasopharynx clear.  NECK:  Supple, no jugular venous distention. No thyroid enlargement, no tenderness.  LUNGS: Normal breath sounds bilaterally, no wheezing, rales,rhonchi or crepitation. No use of accessory muscles of respiration.  CARDIOVASCULAR: S1, S2 normal. No murmurs, rubs, or gallops.  ABDOMEN: Soft, non-tender, non-distended. Bowel sounds present. No organomegaly or mass.  EXTREMITIES: No pedal edema, cyanosis, or clubbing.  NEUROLOGIC: Cranial nerves II through XII are intact. Muscle strength 5/5 in all extremities. Sensation intact. Gait not checked. weak PSYCHIATRIC: The patient is alert and oriented  x 3.  SKIN: No obvious rash, lesion, or ulcer.   DATA REVIEW:   CBC  Recent Labs  Lab 12/19/19 0521  WBC 9.5  HGB 8.8*  HCT 26.0*  PLT 270    Chemistries  Recent Labs  Lab 12/18/19 2103 12/18/19 2103 12/19/19 0521  NA 134*   < > 135  K 3.8   < > 4.1  CL 97*   < > 98  CO2 25   < > 23  GLUCOSE 104*   < > 82  BUN 27*   < > 28*  CREATININE 6.31*   < > 6.88*  CALCIUM 9.0   < > 8.9  AST 17  --   --   ALT 9  --   --   ALKPHOS 79  --   --   BILITOT 0.5  --   --    < > = values in this interval not displayed.    Microbiology Results   Recent Results (from the past 240 hour(s))  SARS CORONAVIRUS 2 (TAT 6-24 HRS) Nasopharyngeal Nasopharyngeal Swab     Status: None   Collection Time: 12/19/19  1:02 AM   Specimen: Nasopharyngeal Swab  Result Value Ref Range Status   SARS Coronavirus 2 NEGATIVE NEGATIVE Final    Comment: (NOTE) SARS-CoV-2 target nucleic acids are NOT DETECTED. The SARS-CoV-2 RNA is generally detectable in upper and lower respiratory specimens during the acute phase of infection. Negative results do not preclude SARS-CoV-2 infection, do not rule out co-infections with other pathogens, and should not be used as the sole basis for treatment or other  patient management decisions. Negative results must be combined with clinical observations, patient history, and epidemiological information. The expected result is Negative. Fact Sheet for Patients: SugarRoll.be Fact Sheet for Healthcare Providers: https://www.woods-mathews.com/ This test is not yet approved or cleared by the [place] FDA and  has been authorized for detection and/or diagnosis of SARS-CoV-2 by FDA under an Emergency Use Authorization (EUA). This EUA will remain  in effect (meaning this test can be used) for the duration of the COVID-19 declaration under Section 56 4(b)(1) of the Act, 21 U.S.C. section 360bbb-3(b)(1), unless the authorization is terminated or revoked sooner. Performed at [facility], [place] [address] [place], [place] [address]     RADIOLOGY:  CT Head Wo Contrast  Result Date: 12/18/2019 CLINICAL DATA:  Head trauma. EXAM: CT HEAD WITHOUT CONTRAST TECHNIQUE: Contiguous axial images were obtained from the base of the skull through the vertex without intravenous contrast. COMPARISON:  CT head dated 05/28/2019. FINDINGS: Brain: No evidence of acute infarction, hemorrhage, hydrocephalus, extra-axial collection or mass lesion/mass effect. Atrophy and chronic microvascular ischemic changes. There is an old lacunar infarct involving the left thalamus. Vascular: No hyperdense vessel or unexpected calcification. Skull: Normal. Negative for fracture or focal lesion. Sinuses/Orbits: No acute finding. Other: None. IMPRESSION: No acute intracranial pathology. Electronically Signed   By: Constance Holster M.D.   On: 12/18/2019 21:57   DG Chest Port 1 View  Result Date: 12/18/2019 CLINICAL DATA:  Syncope EXAM: PORTABLE CHEST 1 VIEW COMPARISON:  09/24/2019 FINDINGS: Right dialysis catheter is unchanged. Prior CABG. Chronic changes in the lungs. Scarring in the lung bases. Heart is borderline in size. No acute confluent  opacities or effusions. No acute bony abnormality. IMPRESSION: Chronic changes in the lungs. Bibasilar scarring. No active disease. Electronically Signed   By: Rolm Baptise M.D.   On: 12/18/2019 23:24     CODE  STATUS:     Code Status Orders  (From admission, onward)         Start     Ordered   12/19/19 0107  Full code  Continuous     12/19/19 0107        Code Status History    Date Active Date Inactive Code Status Order ID Comments User Context   09/24/2019 0515 09/25/2019 2007 Full Code [id number]  Athena Masse, MD ED   07/13/2019 1454 07/17/2019 1705 Full Code [id number]  [name], MD Inpatient   05/28/2019 1319 06/05/2019 1924 Full Code [id number]  Vaughan Basta, MD Inpatient   06/12/2015 1039 06/12/2015 1425 Full Code [id number]  [name], MD Inpatient   11/29/2013 1231 12/03/2013 1810 Full Code [id number]  Caryn Bee Inpatient   Advance Care Planning Activity    Advance Directive Documentation     Most Recent Value  Type of Advance Directive  Healthcare Power of Attorney  Pre-existing out of facility DNR order (yellow form or pink MOST form)  -  "MOST" Form in Place?  -       TOTAL TIME TAKING CARE OF THIS PATIENT: *40* minutes.    Fritzi Mandes M.D  [facility]    CC: Primary care physician; Lorine Bears Lupita Raider, FNP

## 2019-12-20 NOTE — TOC Transition Note (Signed)
Transition of Care [facility]) - CM/SW Discharge Note   Patient Details  Name: Daryl Weeks MRN: [id number] Date of Birth: [birth year]  [facility] CM/SW Contact:  Su Hilt, RN Phone Number: 12/20/2019, 10:55 AM   Clinical Narrative:     Patient to discharge home with DME RW and 3 in 1 in the room, he lives alone, he is already [facility] with [facility], new orders placed for continuous services  Final next level of care: Home w Home Health Services Barriers to Discharge: Continued Medical Work up   Patient Goals and CMS Choice Patient states their goals for this hospitalization and ongoing recovery are:: Go home      Discharge Placement                       Discharge Plan and Services   Discharge Planning Services: CM Consult            DME Arranged: 3-N-1, Walker rolling DME Agency: [employer] Date DME Agency Contacted: 12/20/19 Time DME Agency Contacted: [phone] Representative spoke with at DME Agency: [place] [place] HH Arranged: PT, OT [place] Agency: [facility] Date [place]: 12/20/19 Time [place]: [phone] Representative spoke with at [place]: [place] ([place]) Interventions     Readmission Risk Interventions Readmission Risk Prevention Plan 12/20/2019 09/25/2019 07/17/2019  Transportation Screening Complete Complete Complete  PCP or Specialist Appt within 3-5 Days Complete Complete Complete  HRI or Home Care Consult Complete Complete Complete  Social Work Consult for [place] Planning/Counseling - - Complete  Palliative Care Screening Not Applicable Not Applicable Not Applicable  Medication Review (RN Care Manager) Referral to Pharmacy Complete Complete  Some recent data might be hidden

## 2019-12-20 NOTE — Progress Notes (Signed)
[facility]  ROUNDING NOTE   Subjective:   Hemodialysis treatment yesterday. Tolerated treatment well.  Patient has told the dialysis liason that he is having some steal symptoms.   Objective:  Vital signs in last 24 hours:  Temp:  [97.5 F (36.4 C)-98.8 F (37.1 C)] 98.2 F (36.8 C) (04/08 1144) Pulse Rate:  [73-80] 79 (04/08 1144) Resp:  [11-21] 17 (04/08 1144) BP: (117-165)/(57-113) 145/78 (04/08 1144) SpO2:  [96 %-99 %] 99 % (04/08 1144) Weight:  [68.1 kg] 68.1 kg (04/08 0500)  Weight change: 2.485 kg Filed Weights   12/18/19 2106 12/19/19 0430 12/20/19 0500  Weight: 65.6 kg 69.3 kg 68.1 kg    Intake/Output: I/O last 3 completed shifts: In: -  Out: 1500 [Other:1500]   Intake/Output this shift:  No intake/output data recorded.  Physical Exam: General: NAD,   Head: Normocephalic, atraumatic. Moist oral mucosal membranes  Eyes: Anicteric, PERRL  Neck: Supple, trachea midline  Lungs:  Clear to auscultation  Heart: Regular rate and rhythm  Abdomen:  Soft, nontender,   Extremities:  no peripheral edema.  Neurologic: Nonfocal, moving all four extremities  Skin: No lesions  Access: RIJ permcath, left AVF maturing    Basic Metabolic Panel: Recent Labs  Lab 12/18/19 2103 12/19/19 0521  NA 134* 135  K 3.8 4.1  CL 97* 98  CO2 25 23  GLUCOSE 104* 82  BUN 27* 28*  CREATININE 6.31* 6.88*  CALCIUM 9.0 8.9    Liver Function Tests: Recent Labs  Lab 12/18/19 2103  AST 17  ALT 9  ALKPHOS 79  BILITOT 0.5  PROT 6.5  ALBUMIN 2.5*   Recent Labs  Lab 12/18/19 2103  LIPASE 45   No results for input(s): AMMONIA in the last 168 hours.  CBC: Recent Labs  Lab 12/18/19 2103 12/19/19 0521  WBC 9.0 9.5  HGB 9.2* 8.8*  HCT 27.4* 26.0*  MCV 107.0* 105.3*  PLT 302 270    Cardiac Enzymes: Recent Labs  Lab 12/18/19 2103  CKTOTAL 66    BNP: Invalid input(s): POCBNP  CBG: Recent Labs  Lab 12/20/19 0829  GLUCAP 39     Microbiology: Results for orders placed or performed during the hospital encounter of 12/18/19  SARS CORONAVIRUS 2 (TAT 6-24 HRS) Nasopharyngeal Nasopharyngeal Swab     Status: None   Collection Time: 12/19/19  1:02 AM   Specimen: Nasopharyngeal Swab  Result Value Ref Range Status   SARS Coronavirus 2 NEGATIVE NEGATIVE Final    Comment: (NOTE) SARS-CoV-2 target nucleic acids are NOT DETECTED. The SARS-CoV-2 RNA is generally detectable in upper and lower respiratory specimens during the acute phase of infection. Negative results do not preclude SARS-CoV-2 infection, do not rule out co-infections with other pathogens, and should not be used as the sole basis for treatment or other patient management decisions. Negative results must be combined with clinical observations, patient history, and epidemiological information. The expected result is Negative. Fact Sheet for Patients: SugarRoll.be Fact Sheet for Healthcare Providers: https://www.woods-mathews.com/ This test is not yet approved or cleared by the [place] FDA and  has been authorized for detection and/or diagnosis of SARS-CoV-2 by FDA under an Emergency Use Authorization (EUA). This EUA will remain  in effect (meaning this test can be used) for the duration of the COVID-19 declaration under Section 56 4(b)(1) of the Act, 21 U.S.C. section 360bbb-3(b)(1), unless the authorization is terminated or revoked sooner. Performed at [facility], [place] [address] [place], [place] [address]  Coagulation Studies: No results for input(s): LABPROT, INR in the last 72 hours.  Urinalysis: No results for input(s): COLORURINE, LABSPEC, PHURINE, GLUCOSEU, HGBUR, BILIRUBINUR, KETONESUR, PROTEINUR, UROBILINOGEN, NITRITE, LEUKOCYTESUR in the last 72 hours.  Invalid input(s): APPERANCEUR    Imaging: CT Head Wo Contrast  Result Date: 12/18/2019 CLINICAL DATA:  Head trauma. EXAM: CT  HEAD WITHOUT CONTRAST TECHNIQUE: Contiguous axial images were obtained from the base of the skull through the vertex without intravenous contrast. COMPARISON:  CT head dated 05/28/2019. FINDINGS: Brain: No evidence of acute infarction, hemorrhage, hydrocephalus, extra-axial collection or mass lesion/mass effect. Atrophy and chronic microvascular ischemic changes. There is an old lacunar infarct involving the left thalamus. Vascular: No hyperdense vessel or unexpected calcification. Skull: Normal. Negative for fracture or focal lesion. Sinuses/Orbits: No acute finding. Other: None. IMPRESSION: No acute intracranial pathology. Electronically Signed   By: Constance Holster M.D.   On: 12/18/2019 21:57   DG Chest Port 1 View  Result Date: 12/18/2019 CLINICAL DATA:  Syncope EXAM: PORTABLE CHEST 1 VIEW COMPARISON:  09/24/2019 FINDINGS: Right dialysis catheter is unchanged. Prior CABG. Chronic changes in the lungs. Scarring in the lung bases. Heart is borderline in size. No acute confluent opacities or effusions. No acute bony abnormality. IMPRESSION: Chronic changes in the lungs. Bibasilar scarring. No active disease. Electronically Signed   By: Rolm Baptise M.D.   On: 12/18/2019 23:24     Medications:    . amLODipine  10 mg Oral QHS  . apixaban  2.5 mg Oral Daily  . aspirin EC  81 mg Oral Daily  . atorvastatin  20 mg Oral BH-q7a  . carvedilol  25 mg Oral BID WC  . Chlorhexidine Gluconate Cloth  6 each Topical Q0600  . feeding supplement (NEPRO CARB STEADY)  237 mL Oral BID BM  . ferric citrate  420 mg Oral TID WC  . fluticasone furoate-vilanterol  1 puff Inhalation Daily   And  . umeclidinium bromide  1 puff Inhalation Daily  . gabapentin  100 mg Oral TID  . multivitamin  1 tablet Oral QHS  . multivitamin with minerals  1 tablet Oral Daily  . sevelamer carbonate  800 mg Oral TID WC  . sodium chloride flush  3 mL Intravenous Q12H   acetaminophen **OR** acetaminophen, ondansetron **OR**  ondansetron (ZOFRAN) IV, zolpidem  Assessment/ Plan:  [name] is a 59 y.o. white male with end stage renal disease on hemodialysis secondary to IgA nephropathy, rheumatoid arthritis, atrial fibrillation, coronary artery disease status post CABG, congestive heart failure who is admitted to [facility] on 12/18/2019 for Orthostasis [I95.1] ESRD (end stage renal disease) on dialysis ([place]) [N18.6, Z99.2] Syncope due to orthostatic hypotension [I95.1] Syncope, unspecified syncope type [R55] Hypotension [I95.9]  [facility] MWF [facility] RIJ 65kg  1. End Stage Renal Disease:  Continue MWF schedule Complication of dialysis device. If patient was to stay, recommend vascular consult.   2. Hypertension: history of difficult to control blood pressures. Home regimen of amlodipine, irbesartan, furosemide, clonidine, hydralazine Holding all blood pressure medications currently.  - Discharge on only amlodipine and carvedilol - Holding gabapentin as well.   3. Anemia of chronic kidney disease: hemoglobin 8.8. macrocytic. History of alcohol abuse.  - EPO with HD treatment  4. Secondary Hyperparathyroidism: with hyperphosphatemia Binders: sevelamer and auryxia.     LOS: 1 Manuela Halbur 4/8/202112:37 PM

## 2019-12-20 NOTE — Progress Notes (Signed)
MD order received in [facility] to discharge pt home with home health and DME (rolling walker and 3n1 BSC); TOC previously established home health with [facility]; DME (RW and BSC) previously delivered to the pt's room to take home with him; verbally reviewed AVS with pt,no questions voiced at this time; pt discharged via wheelchair by nursing to the [place] entrance

## 2019-12-21 DIAGNOSIS — Z992 Dependence on renal dialysis: Secondary | ICD-10-CM | POA: Diagnosis not present

## 2019-12-21 DIAGNOSIS — D509 Iron deficiency anemia, unspecified: Secondary | ICD-10-CM | POA: Diagnosis not present

## 2019-12-21 DIAGNOSIS — N186 End stage renal disease: Secondary | ICD-10-CM | POA: Diagnosis not present

## 2019-12-21 DIAGNOSIS — D631 Anemia in chronic kidney disease: Secondary | ICD-10-CM | POA: Diagnosis not present

## 2019-12-21 DIAGNOSIS — Z23 Encounter for immunization: Secondary | ICD-10-CM | POA: Diagnosis not present

## 2019-12-21 DIAGNOSIS — N2581 Secondary hyperparathyroidism of renal origin: Secondary | ICD-10-CM | POA: Diagnosis not present

## 2019-12-24 ENCOUNTER — Ambulatory Visit: Payer: Medicare Other | Admitting: Family Medicine

## 2019-12-24 DIAGNOSIS — Z23 Encounter for immunization: Secondary | ICD-10-CM | POA: Diagnosis not present

## 2019-12-24 DIAGNOSIS — D631 Anemia in chronic kidney disease: Secondary | ICD-10-CM | POA: Diagnosis not present

## 2019-12-24 DIAGNOSIS — D509 Iron deficiency anemia, unspecified: Secondary | ICD-10-CM | POA: Diagnosis not present

## 2019-12-24 DIAGNOSIS — N2581 Secondary hyperparathyroidism of renal origin: Secondary | ICD-10-CM | POA: Diagnosis not present

## 2019-12-24 DIAGNOSIS — Z992 Dependence on renal dialysis: Secondary | ICD-10-CM | POA: Diagnosis not present

## 2019-12-24 DIAGNOSIS — N186 End stage renal disease: Secondary | ICD-10-CM | POA: Diagnosis not present

## 2019-12-26 ENCOUNTER — Other Ambulatory Visit: Payer: Self-pay

## 2019-12-26 ENCOUNTER — Other Ambulatory Visit (INDEPENDENT_AMBULATORY_CARE_PROVIDER_SITE_OTHER): Payer: Self-pay | Admitting: Nurse Practitioner

## 2019-12-26 DIAGNOSIS — D509 Iron deficiency anemia, unspecified: Secondary | ICD-10-CM | POA: Diagnosis not present

## 2019-12-26 DIAGNOSIS — Z23 Encounter for immunization: Secondary | ICD-10-CM | POA: Diagnosis not present

## 2019-12-26 DIAGNOSIS — N2581 Secondary hyperparathyroidism of renal origin: Secondary | ICD-10-CM | POA: Diagnosis not present

## 2019-12-26 DIAGNOSIS — N186 End stage renal disease: Secondary | ICD-10-CM | POA: Diagnosis not present

## 2019-12-26 DIAGNOSIS — Z9889 Other specified postprocedural states: Secondary | ICD-10-CM

## 2019-12-26 DIAGNOSIS — Z992 Dependence on renal dialysis: Secondary | ICD-10-CM | POA: Diagnosis not present

## 2019-12-26 DIAGNOSIS — D631 Anemia in chronic kidney disease: Secondary | ICD-10-CM | POA: Diagnosis not present

## 2019-12-26 NOTE — Patient Outreach (Signed)
[facility]) Care Management  12/26/2019  KAIDENCE CALLAWAY 1961-07-31 [id number]    Red Emmi: Date of call:  12/25/2019 Reason for alert:  Lost interest in things-  Yes                              Sad/ hopeless/anxious/ empty-  Yes   Placed call to patient above above red emmi alert. No answer. Left a message requesting a call back.  PLAN: will await a call back. If no response will call patient back in 3 days. Will send unsuccessful outreach letter today.  Tomasa Rand, RN, BSN, CEN [facility] [employer] [phone]

## 2019-12-27 ENCOUNTER — Ambulatory Visit (INDEPENDENT_AMBULATORY_CARE_PROVIDER_SITE_OTHER): Payer: Medicare Other | Admitting: Nurse Practitioner

## 2019-12-27 ENCOUNTER — Encounter (INDEPENDENT_AMBULATORY_CARE_PROVIDER_SITE_OTHER): Payer: Self-pay | Admitting: Nurse Practitioner

## 2019-12-27 ENCOUNTER — Other Ambulatory Visit: Payer: Self-pay

## 2019-12-27 ENCOUNTER — Ambulatory Visit (INDEPENDENT_AMBULATORY_CARE_PROVIDER_SITE_OTHER): Payer: Medicare Other

## 2019-12-27 VITALS — BP 134/73 | HR 80 | Resp 16 | Ht 67.0 in | Wt 150.0 lb

## 2019-12-27 DIAGNOSIS — N186 End stage renal disease: Secondary | ICD-10-CM | POA: Diagnosis not present

## 2019-12-27 DIAGNOSIS — Z720 Tobacco use: Secondary | ICD-10-CM

## 2019-12-27 DIAGNOSIS — I10 Essential (primary) hypertension: Secondary | ICD-10-CM

## 2019-12-27 DIAGNOSIS — Z9889 Other specified postprocedural states: Secondary | ICD-10-CM | POA: Diagnosis not present

## 2019-12-27 DIAGNOSIS — D631 Anemia in chronic kidney disease: Secondary | ICD-10-CM | POA: Diagnosis not present

## 2019-12-27 DIAGNOSIS — I5032 Chronic diastolic (congestive) heart failure: Secondary | ICD-10-CM | POA: Diagnosis not present

## 2019-12-27 DIAGNOSIS — Z992 Dependence on renal dialysis: Secondary | ICD-10-CM | POA: Diagnosis not present

## 2019-12-27 DIAGNOSIS — T82898D Other specified complication of vascular prosthetic devices, implants and grafts, subsequent encounter: Secondary | ICD-10-CM | POA: Diagnosis not present

## 2019-12-27 DIAGNOSIS — I132 Hypertensive heart and chronic kidney disease with heart failure and with stage 5 chronic kidney disease, or end stage renal disease: Secondary | ICD-10-CM | POA: Diagnosis not present

## 2019-12-27 NOTE — Progress Notes (Signed)
Subjective:    Patient ID: Daryl Weeks, male    DOB: 05-23-1961, 59 y.o.   MRN: [id number] Chief Complaint  Patient presents with  . Follow-up    3 wks armc post fistula; hda     Patient returns today for evaluation of his left brachiocephalic AV fistula.  Fistula was placed on 11/30/2019.  The patient is having some numbness in his fourth and fifth fingers however it is not significant.  He denies any coldness or ulcerations of his fingertips.  Patient also has a visible hematoma to the lateral portion as well as a still healing incision.  It is slightly pink around the incision but there is no drainage or significant erythema to suggest infection.  The patient denies any fever, chills, nausea, vomiting or diarrhea.  The patient is currently still on dialysis utilizing his PermCath.  Today the patient has a patent AV fistula with a flow volume of 1365.  There appears to be a stricture at about 0.26 cm with the fistula measuring 0.84 cm beyond the point of the stricture.  There is a hematoma in the left distal upper arm at the region of the AV fistula.  Compression of the AV fistula while monitoring the radial artery shows decreased radial artery velocities.   Review of Systems  Skin: Positive for wound.  Neurological: Positive for numbness.  All other systems reviewed and are negative.      Objective:   Physical Exam Vitals reviewed.  HENT:     Head: Normocephalic.  Cardiovascular:     Rate and Rhythm: Normal rate and regular rhythm.     Pulses:          Radial pulses are 2+ on the left side.     Arteriovenous access: left arteriovenous access is present.    Comments: Left brachiocephalic AV fistula with still healing incision.  Good thrill and bruit throughout.  Hematoma located near the lateral portion.  All fingers warm with no evidence of ulceration or ischemia Neurological:     Mental Status: He is alert.  Psychiatric:        Speech: Speech is slurred.     BP 134/73  (BP Location: Right Arm)   Pulse 80   Resp 16   Ht 5\' 7"  (1.702 m)   Wt 150 lb (68 kg)   BMI 23.49 kg/m   Past Medical History:  Diagnosis Date  . (HFpEF) heart failure with preserved ejection fraction ([place])    a. 05/2019 Echo: EF 50-55%, diast dysfxn, RVSP 56.52mmHg, Sev dil LA. Mildly dil PA.  [name] Anemia   . Anxiety   . Arthritis   . Colon polyps   . COPD (chronic obstructive pulmonary disease) ([place])   . Coronary artery disease    a. 10/2013 NSTEMI/Cath: Severe 3VD-->CABG x 4 @ [facility] 01/2014 (LIMA->LAD, VG->OM1->OM2, VG->RCA); b. 11/2014 MV: No isch/infarct; c. 05/2019 MV: EF 40% (50-55 by echo), small,mild, fixed apical defect - ? atten vs infarct. No ischemia->low risk.  . ESRD (end stage renal disease) ([place])    a. MWFSat HD  . ETOH abuse    a. 2 beers/night.  [name] GERD (gastroesophageal reflux disease)   . History of pneumonia   . Hyperlipidemia   . Hypertension   . IgA nephropathy   . Lower GI bleed    a. Due to colon polyps. Status post resection of 14 polyps  . Non-ST elevation MI (NSTEMI) ([place]) 2016  . PAF (paroxysmal atrial fibrillation) ([place])  a. Dx 05/2019. CHA2DS2VASc = 3-->Eliquis/Amio.  . Pneumonia 08/2019  . Rheumatoid arthritis ([place])   . Shortness of breath    WITH EXERTION  . Sleep apnea   . Tobacco abuse     Social History   Socioeconomic History  . Marital status: Single    Spouse name: Not on file  . Number of children: Not on file  . Years of education: Not on file  . Highest education level: 8th grade  Occupational History  . Not on file  Tobacco Use  . Smoking status: Current Every Day Smoker    Packs/day: 0.50    Years: 32.00    Pack years: 16.00    Types: Cigarettes  . Smokeless tobacco: Never Used  . Tobacco comment: daily  Substance and Sexual Activity  . Alcohol use: Yes    Alcohol/week: 7.0 standard drinks    Types: 7 Cans of beer per week    Comment: 3 BEERS PER WEEK  . Drug use: No  . Sexual activity: Not on file  Other Topics  Concern  . Not on file  Social History Narrative   Lives locally by himself.  Does not routinely exercise.   Social Determinants of Health   Financial Resource Strain:   . Difficulty of Paying Living Expenses:   Food Insecurity:   . Worried About Charity fundraiser in the Last Year:   . Arboriculturist in the Last Year:   Transportation Needs:   . Film/video editor (Medical):   [name] Lack of Transportation (Non-Medical):   Physical Activity:   . Days of Exercise per Week:   . Minutes of Exercise per Session:   Stress:   . Feeling of Stress :   Social Connections:   . Frequency of Communication with Friends and Family:   . Frequency of Social Gatherings with Friends and Family:   . Attends Religious Services:   . Active Member of Clubs or Organizations:   . Attends Archivist Meetings:   [name] Marital Status:   Intimate Partner Violence:   . Fear of Current or Ex-Partner:   . Emotionally Abused:   [name] Physically Abused:   . Sexually Abused:     Past Surgical History:  Procedure Laterality Date  . A/V FISTULAGRAM Left 10/28/2016   Procedure: A/V Fistulagram;  Surgeon: [name], MD;  Location: [place] [facility];  Service: Cardiovascular;  Laterality: Left;  . A/V FISTULAGRAM Left 12/07/2018   Procedure: A/V FISTULAGRAM;  Surgeon: [name], MD;  Location: [place] [facility];  Service: Cardiovascular;  Laterality: Left;  . A/V FISTULAGRAM N/A 06/04/2019   Procedure: A/V Fistulagram- LEFT;  Surgeon: [name], MD;  Location: [place] [facility];  Service: Cardiovascular;  Laterality: N/A;  . A/V SHUNT INTERVENTION N/A 10/28/2016   Procedure: A/V Shunt Intervention;  Surgeon: [name], MD;  Location: [place] [facility];  Service: Cardiovascular;  Laterality: N/A;  . A/V SHUNTOGRAM Left 02/01/2019   Procedure: A/V SHUNTOGRAM;  Surgeon: [name], MD;  Location: [place] [facility];  Service: Cardiovascular;  Laterality: Left;  . AV FISTULA PLACEMENT      . AV FISTULA PLACEMENT Left 11/30/2019   Procedure: ARTERIOVENOUS (AV) FISTULA CREATION ( BRACHIAL CEPHALIC );  Surgeon: Katha Cabal, MD;  Location: [facility];  Service: Vascular;  Laterality: Left;  . CARDIAC CATHETERIZATION     RCA 90% and calcified mid LAD 80% Stenosis  .  CORONARY ARTERY BYPASS GRAFT N/A 11/29/2013   Procedure: CORONARY ARTERY BYPASS GRAFTING (CABG) x 4 using endoscopically harvested right saphenous vein and left internal mammary artery;  Surgeon: [name], MD;  Location: [place] [facility];  Service: Open Heart Surgery;  Laterality: N/A;  . dialysis catheterr  2/15  . DIALYSIS/PERMA CATHETER INSERTION N/A 07/16/2019   Procedure: DIALYSIS/PERMA CATHETER INSERTION WITH VAC CHANGE UNDER SEDATION;  Surgeon: [name], MD;  Location: [place] [facility];  Service: Cardiovascular;  Laterality: N/A;  . DIALYSIS/PERMA CATHETER INSERTION N/A 11/08/2019   Procedure: DIALYSIS/PERMA CATHETER INSERTION;  Surgeon: [name], MD;  Location: [place] [facility];  Service: Cardiovascular;  Laterality: N/A;  . DIALYSIS/PERMA CATHETER REMOVAL N/A 02/15/2019   Procedure: DIALYSIS/PERMA CATHETER REMOVAL;  Surgeon: [name], MD;  Location: [place] [facility];  Service: Cardiovascular;  Laterality: N/A;  . INSERTION OF DIALYSIS CATHETER N/A 12/14/2018   Procedure: INSERTION OF DIALYSIS CATHETER ( [place] );  Surgeon: [name], MD;  Location: [facility];  Service: Vascular;  Laterality: N/A;  . INTRAOPERATIVE TRANSESOPHAGEAL ECHOCARDIOGRAM N/A 11/29/2013   Procedure: INTRAOPERATIVE TRANSESOPHAGEAL ECHOCARDIOGRAM;  Surgeon: [name], MD;  Location: [place] [facility];  Service: Open Heart Surgery;  Laterality: N/A;  . LIGATION OF ARTERIOVENOUS  FISTULA Left 07/13/2019   Procedure: LIGATION OF ARTERIOVENOUS  FISTULA;  Surgeon: [name], MD;  Location: [facility];  Service: Vascular;  Laterality: Left;  . PERIPHERAL VASCULAR CATHETERIZATION N/A 06/12/2015   Procedure: A/V Shuntogram/Fistulagram;   Surgeon: [name], MD;  Location: [place] [facility];  Service: Cardiovascular;  Laterality: N/A;  . PERIPHERAL VASCULAR CATHETERIZATION Left 06/12/2015   Procedure: A/V Shunt Intervention;  Surgeon: [name], MD;  Location: [place] [facility];  Service: Cardiovascular;  Laterality: Left;  . RENAL BIOPSY Left 14  . REVISON OF ARTERIOVENOUS FISTULA Left 12/14/2018   Procedure: REVISON OF ARTERIOVENOUS FISTULA;  Surgeon: [name], MD;  Location: [facility];  Service: Vascular;  Laterality: Left;  . UPPER EXTREMITY VENOGRAPHY Bilateral 09/18/2019   Procedure: UPPER EXTREMITY VENOGRAPHY;  Surgeon: Katha Cabal, MD;  Location: [place] [facility];  Service: Cardiovascular;  Laterality: Bilateral;    Family History  Problem Relation Age of Onset  . Heart disease Father   . Heart disease Brother   . Healthy Sister   . Stroke Neg Hx     Allergies  Allergen Reactions  . Lisinopril Hives       Assessment & Plan:    1. ESRD (end stage renal disease) ([place]) Patient currently has hematoma in his lateral portion of his fistula.  The incision is still healing at this point slightly pink around the incision but not significant for infection.  The patient does have evidence of a stricture however this could be due to the hematoma itself.  The fistula is not yet mature so we will have the patient return in 6 weeks to evaluate the swelling of the hematoma in addition to see if there is any improvement in the stricture.  We will also see if the patient's numbness in his fingers has decreased as well. - VAS US DUPLEX DIALYSIS ACCESS (AVF, AVG); Future  2. Tobacco abuse Smoking cessation was discussed, 3-10 minutes spent on this topic specifically   3. Essential hypertension Continue antihypertensive medications as already ordered, these medications have been reviewed and there are no changes at this time.    Current Outpatient Medications on File Prior to Visit  Medication Sig  Dispense Refill  . amiodarone (PACERONE) 200 MG tablet Take 200 mg by mouth every morning.     [name] amLODipine (NORVASC) 10 MG tablet Take 10 mg by mouth at bedtime.    [name] apixaban (ELIQUIS) 2.5 MG TABS tablet Take 2.5 mg by mouth daily.     [name] aspirin EC 81 MG tablet Take 1 tablet (81 mg total) by mouth daily. 90 tablet 3  . atorvastatin (LIPITOR) 20 MG tablet take 1 tablet by mouth at bedtime (Patient taking differently: Take 20 mg by mouth every morning. ) 30 tablet 6  . AURYXIA 1 GM 210 MG(Fe) tablet Take 420 mg by mouth 3 (three) times daily with meals.   11  . calcium carbonate (TUMS - DOSED IN MG ELEMENTAL CALCIUM) 500 MG chewable tablet Chew 1 tablet by mouth daily as needed for indigestion or heartburn.     . carvedilol (COREG) 12.5 MG tablet Take 2 tablets (25 mg total) by mouth 2 (two) times daily with a meal. 30 tablet 1  . cinacalcet (SENSIPAR) 30 MG tablet Take 30 mg by mouth daily with supper.     . docusate sodium (COLACE) 100 MG capsule Take 1 capsule (100 mg total) by mouth 2 (two) times daily as needed for mild constipation. 10 capsule 0  . Fluticasone Propionate, Inhal, (FLOVENT DISKUS) 100 MCG/BLIST AEPB Inhale 1 puff into the lungs 2 (two) times daily. (Patient taking differently: Inhale 1 puff into the lungs every morning. ) 60 each 3  . Fluticasone-Umeclidin-Vilant (TRELEGY ELLIPTA) 100-62.5-25 MCG/INH AEPB Inhale 1 puff into the lungs daily. (Patient taking differently: Inhale 1 puff into the lungs every other day. ) 60 each 5  . furosemide (LASIX) 80 MG tablet Take 80 mg by mouth 2 (two) times daily.     [name] gabapentin (NEURONTIN) 100 MG capsule Take 100 mg by mouth 3 (three) times daily.   11  . HYDROcodone-acetaminophen (NORCO) 5-325 MG tablet Take 1-2 tablets by mouth every 6 (six) hours as needed for moderate pain or severe pain. 25 tablet 0  . loratadine (CLARITIN) 10 MG tablet Take 1 tablet (10 mg total) by mouth daily. (Patient taking differently: Take 10 mg by mouth every  morning. ) 30 tablet 0  . multivitamin (RENA-VIT) TABS tablet Take 1 tablet by mouth at bedtime. 30 tablet 0  . ondansetron (ZOFRAN-ODT) 4 MG disintegrating tablet Take 4 mg by mouth every 8 (eight) hours as needed for nausea.     . sevelamer carbonate (RENVELA) 800 MG tablet Take 800-2,400 mg by mouth See admin instructions. Take 3 tablets (2400mg ) by mouth three times daily before meals and take 1 tablet (800mg ) by mouth once daily with a snack    . SPIRIVA HANDIHALER 18 MCG inhalation capsule Place 18 mcg into inhaler and inhale daily.     [name] umeclidinium-vilanterol (ANORO ELLIPTA) 62.5-25 MCG/INH AEPB Inhale 1 puff into the lungs daily. 14 each 3  . zolpidem (AMBIEN) 10 MG tablet Take 10 mg by mouth at bedtime as needed for sleep.     No current facility-administered medications on file prior to visit.    There are no Patient Instructions on file for this visit. No follow-ups on file.   Kris Hartmann, NP

## 2019-12-28 DIAGNOSIS — Z23 Encounter for immunization: Secondary | ICD-10-CM | POA: Diagnosis not present

## 2019-12-28 DIAGNOSIS — N186 End stage renal disease: Secondary | ICD-10-CM | POA: Diagnosis not present

## 2019-12-28 DIAGNOSIS — T82898D Other specified complication of vascular prosthetic devices, implants and grafts, subsequent encounter: Secondary | ICD-10-CM | POA: Diagnosis not present

## 2019-12-28 DIAGNOSIS — D509 Iron deficiency anemia, unspecified: Secondary | ICD-10-CM | POA: Diagnosis not present

## 2019-12-28 DIAGNOSIS — D631 Anemia in chronic kidney disease: Secondary | ICD-10-CM | POA: Diagnosis not present

## 2019-12-28 DIAGNOSIS — I5032 Chronic diastolic (congestive) heart failure: Secondary | ICD-10-CM | POA: Diagnosis not present

## 2019-12-28 DIAGNOSIS — I132 Hypertensive heart and chronic kidney disease with heart failure and with stage 5 chronic kidney disease, or end stage renal disease: Secondary | ICD-10-CM | POA: Diagnosis not present

## 2019-12-28 DIAGNOSIS — Z992 Dependence on renal dialysis: Secondary | ICD-10-CM | POA: Diagnosis not present

## 2019-12-28 DIAGNOSIS — N2581 Secondary hyperparathyroidism of renal origin: Secondary | ICD-10-CM | POA: Diagnosis not present

## 2019-12-29 DIAGNOSIS — N186 End stage renal disease: Secondary | ICD-10-CM | POA: Diagnosis not present

## 2019-12-29 DIAGNOSIS — E8779 Other fluid overload: Secondary | ICD-10-CM | POA: Diagnosis not present

## 2019-12-29 DIAGNOSIS — Z992 Dependence on renal dialysis: Secondary | ICD-10-CM | POA: Diagnosis not present

## 2019-12-31 ENCOUNTER — Other Ambulatory Visit: Payer: Self-pay

## 2019-12-31 ENCOUNTER — Ambulatory Visit: Payer: Medicare Other

## 2019-12-31 DIAGNOSIS — F17210 Nicotine dependence, cigarettes, uncomplicated: Secondary | ICD-10-CM | POA: Diagnosis not present

## 2019-12-31 DIAGNOSIS — D631 Anemia in chronic kidney disease: Secondary | ICD-10-CM | POA: Diagnosis not present

## 2019-12-31 DIAGNOSIS — Z992 Dependence on renal dialysis: Secondary | ICD-10-CM | POA: Diagnosis not present

## 2019-12-31 DIAGNOSIS — R531 Weakness: Secondary | ICD-10-CM | POA: Diagnosis not present

## 2019-12-31 DIAGNOSIS — I2510 Atherosclerotic heart disease of native coronary artery without angina pectoris: Secondary | ICD-10-CM | POA: Diagnosis not present

## 2019-12-31 DIAGNOSIS — M130 Polyarthritis, unspecified: Secondary | ICD-10-CM | POA: Diagnosis not present

## 2019-12-31 DIAGNOSIS — I132 Hypertensive heart and chronic kidney disease with heart failure and with stage 5 chronic kidney disease, or end stage renal disease: Secondary | ICD-10-CM | POA: Diagnosis not present

## 2019-12-31 DIAGNOSIS — Z23 Encounter for immunization: Secondary | ICD-10-CM | POA: Diagnosis not present

## 2019-12-31 DIAGNOSIS — N186 End stage renal disease: Secondary | ICD-10-CM | POA: Diagnosis not present

## 2019-12-31 DIAGNOSIS — I5032 Chronic diastolic (congestive) heart failure: Secondary | ICD-10-CM | POA: Diagnosis not present

## 2019-12-31 DIAGNOSIS — G4730 Sleep apnea, unspecified: Secondary | ICD-10-CM | POA: Diagnosis not present

## 2019-12-31 DIAGNOSIS — N2581 Secondary hyperparathyroidism of renal origin: Secondary | ICD-10-CM | POA: Diagnosis not present

## 2019-12-31 DIAGNOSIS — R1314 Dysphagia, pharyngoesophageal phase: Secondary | ICD-10-CM | POA: Diagnosis not present

## 2019-12-31 DIAGNOSIS — F419 Anxiety disorder, unspecified: Secondary | ICD-10-CM | POA: Diagnosis not present

## 2019-12-31 DIAGNOSIS — J449 Chronic obstructive pulmonary disease, unspecified: Secondary | ICD-10-CM | POA: Diagnosis not present

## 2019-12-31 DIAGNOSIS — M069 Rheumatoid arthritis, unspecified: Secondary | ICD-10-CM | POA: Diagnosis not present

## 2019-12-31 DIAGNOSIS — I69322 Dysarthria following cerebral infarction: Secondary | ICD-10-CM | POA: Diagnosis not present

## 2019-12-31 DIAGNOSIS — I480 Paroxysmal atrial fibrillation: Secondary | ICD-10-CM | POA: Diagnosis not present

## 2019-12-31 DIAGNOSIS — N028 Recurrent and persistent hematuria with other morphologic changes: Secondary | ICD-10-CM | POA: Diagnosis not present

## 2019-12-31 DIAGNOSIS — D509 Iron deficiency anemia, unspecified: Secondary | ICD-10-CM | POA: Diagnosis not present

## 2019-12-31 DIAGNOSIS — Z743 Need for continuous supervision: Secondary | ICD-10-CM | POA: Diagnosis not present

## 2019-12-31 NOTE — Patient Outreach (Signed)
[facility]) Care Management  12/31/2019  HAKEEN SHIPES 1961/02/18 [id number]   Red Emmi: 2nd outreach unsuccessful.  PLAN: will attempt 3rd outreach in 3 days. Already mailed letter.  Tomasa Rand, RN, BSN, CEN [facility] [employer] [phone]

## 2020-01-01 ENCOUNTER — Other Ambulatory Visit: Payer: Self-pay

## 2020-01-01 MED ORDER — CARVEDILOL 12.5 MG PO TABS: 25.0000 mg | ORAL_TABLET | Freq: Two times a day (BID) | ORAL | 0 refills | Status: AC

## 2020-01-02 ENCOUNTER — Ambulatory Visit: Payer: Medicare Other

## 2020-01-02 DIAGNOSIS — D631 Anemia in chronic kidney disease: Secondary | ICD-10-CM | POA: Diagnosis not present

## 2020-01-02 DIAGNOSIS — Z23 Encounter for immunization: Secondary | ICD-10-CM | POA: Diagnosis not present

## 2020-01-02 DIAGNOSIS — Z992 Dependence on renal dialysis: Secondary | ICD-10-CM | POA: Diagnosis not present

## 2020-01-02 DIAGNOSIS — N186 End stage renal disease: Secondary | ICD-10-CM | POA: Diagnosis not present

## 2020-01-02 DIAGNOSIS — N2581 Secondary hyperparathyroidism of renal origin: Secondary | ICD-10-CM | POA: Diagnosis not present

## 2020-01-02 DIAGNOSIS — D509 Iron deficiency anemia, unspecified: Secondary | ICD-10-CM | POA: Diagnosis not present

## 2020-01-03 ENCOUNTER — Encounter: Payer: Self-pay | Admitting: Cardiovascular Disease

## 2020-01-03 ENCOUNTER — Other Ambulatory Visit: Payer: Self-pay

## 2020-01-03 ENCOUNTER — Encounter: Payer: Self-pay | Admitting: Family Medicine

## 2020-01-03 ENCOUNTER — Ambulatory Visit (INDEPENDENT_AMBULATORY_CARE_PROVIDER_SITE_OTHER): Payer: Medicare Other | Admitting: Family Medicine

## 2020-01-03 ENCOUNTER — Ambulatory Visit (INDEPENDENT_AMBULATORY_CARE_PROVIDER_SITE_OTHER): Payer: Medicare Other | Admitting: Cardiovascular Disease

## 2020-01-03 VITALS — BP 109/53 | HR 78 | Temp 97.7°F | Ht 67.0 in | Wt 154.0 lb

## 2020-01-03 VITALS — BP 113/68 | HR 67 | Ht 67.0 in | Wt 154.1 lb

## 2020-01-03 DIAGNOSIS — D631 Anemia in chronic kidney disease: Secondary | ICD-10-CM | POA: Diagnosis not present

## 2020-01-03 DIAGNOSIS — M7021 Olecranon bursitis, right elbow: Secondary | ICD-10-CM | POA: Insufficient documentation

## 2020-01-03 DIAGNOSIS — Z992 Dependence on renal dialysis: Secondary | ICD-10-CM | POA: Diagnosis not present

## 2020-01-03 DIAGNOSIS — J449 Chronic obstructive pulmonary disease, unspecified: Secondary | ICD-10-CM | POA: Diagnosis not present

## 2020-01-03 DIAGNOSIS — L539 Erythematous condition, unspecified: Secondary | ICD-10-CM | POA: Diagnosis not present

## 2020-01-03 DIAGNOSIS — I2510 Atherosclerotic heart disease of native coronary artery without angina pectoris: Secondary | ICD-10-CM

## 2020-01-03 DIAGNOSIS — E785 Hyperlipidemia, unspecified: Secondary | ICD-10-CM

## 2020-01-03 DIAGNOSIS — I482 Chronic atrial fibrillation, unspecified: Secondary | ICD-10-CM

## 2020-01-03 DIAGNOSIS — N186 End stage renal disease: Secondary | ICD-10-CM | POA: Diagnosis not present

## 2020-01-03 DIAGNOSIS — I5032 Chronic diastolic (congestive) heart failure: Secondary | ICD-10-CM | POA: Diagnosis not present

## 2020-01-03 DIAGNOSIS — I132 Hypertensive heart and chronic kidney disease with heart failure and with stage 5 chronic kidney disease, or end stage renal disease: Secondary | ICD-10-CM | POA: Diagnosis not present

## 2020-01-03 NOTE — Patient Outreach (Signed)
[facility]) Care Management  01/03/2020  CARLOUS OLIVARES 05-Jul-1961 [id number]   Emmi- 3rd outreach attempt:  Placed call to patient again without success.  PLAN: case closure on 01/09/2020 if no response.  Tomasa Rand, RN, BSN, CEN [facility] [employer] [phone]

## 2020-01-03 NOTE — Patient Instructions (Signed)
Medication Instructions:  Your physician has recommended you make the following change in your medication:   STOP Amiodarone  *If you need a refill on your cardiac medications before your next appointment, please call your pharmacy*   Lab Work: None ordered If you have labs (blood work) drawn today and your tests are completely normal, you will receive your results only by: [name] MyChart Message (if you have MyChart) OR . A paper copy in the mail If you have any lab test that is abnormal or we need to change your treatment, we will call you to review the results.   Testing/Procedures: None ordered   Follow-Up: At [facility], you and your health needs are our priority.  As part of our continuing mission to provide you with exceptional heart care, we have created designated Provider Care Teams.  These Care Teams include your primary Cardiologist (physician) and Advanced Practice Providers (APPs -  Physician Assistants and Nurse Practitioners) who all work together to provide you with the care you need, when you need it.  We recommend signing up for the patient portal called "MyChart".  Sign up information is provided on this After Visit Summary.  MyChart is used to connect with patients for Virtual Visits (Telemedicine).  Patients are able to view lab/test results, encounter notes, upcoming appointments, etc.  Non-urgent messages can be sent to your provider as well.   To learn more about what you can do with MyChart, go to [email or web address].    Your next appointment:   6 month(s)  The format for your next appointment:   In Person  Provider:    You may see Kathlyn Sacramento, MD or one of the following Advanced Practice Providers on your designated Care Team:    Murray Hodgkins, NP  Christell Faith, PA-C  Marrianne Mood, PA-C    Other Instructions N/A

## 2020-01-03 NOTE — Assessment & Plan Note (Signed)
Discussed likely needs to see Orthopedics for evaluation and treatment, which may include in office drainage of the inflamed bursa vs. Surgery or watchful waiting.  Referral placed for Orthopedics and contact information given for [facility] if we would like to proceed through their Urgent Care Walk In center.

## 2020-01-03 NOTE — Assessment & Plan Note (Addendum)
Discussed redness around right chest dialysis catheter.  Discussed will need to follow up with provider that placed this for evaluation and additional treatment.  No acute s/s of infection other than redness around catheter entry.  Patient reports has dialysis tomorrow morning and will show them when he arrives. Strict ER precautions given.

## 2020-01-03 NOTE — Progress Notes (Signed)
Cardiology Office Note   Date:  01/03/2020   ID:  Daryl Weeks, DOB 04/10/1961, MRN [id number]  PCP:  Verl Bangs, FNP  Cardiologist:   Kathlyn Sacramento, MD   Chief Complaint  Patient presents with   OTHER    F/u armc syncope c/o bilateral arm pain. Meds reviewed verbally with pt.      History of Present Illness: Daryl Weeks is a 59 y.o. male who presents for a followup visit regarding coronary artery disease.  He is status post CABG in March 2015 after he presented with non-ST elevation myocardial infarction in the setting of severe anemia due to GI bleed.  He has known history of end-stage renal disease currently on dialysis and he is on the transplant list at [facility]. Other medical conditions include chronic diastolic heart failure, COPD, atrial fibrillation, prior CVA, sleep apnea, hypertension, hyperlipidemia and tobacco and alcohol use.  Cardiac workup was done in March 2016 in preparation for kidney transplant. Nuclear stress test was normal. Echocardiogram showed normal LV systolic function with mildly dilated left atrium.  He was taken off the kidney transplant list due to continued tobacco use.    He was hospitalized in September 2020 with falls, chest pain and shortness of breath in addition to slurred speech.  MRI of the brain showed no acute infarct but did show chronic lacunar infarcts within the left frontal lobe and left basal ganglia.  He developed atrial fibrillation during dialysis.  Echo showed an EF of 50 to 55%.  RV function was moderately reduced with moderate pulmonary hypertension.  Lexiscan Myoview showed no ischemia.  He was hospitalized again in January with hemoptysis and hyperkalemia.  Eliquis was briefly held. During last visit, we stopped Eliquis and amiodarone given that his atrial fibrillation was brief in the setting of acute illness.  Patient was briefly hospitalized at [facility] recently with a syncopal episode when he was trying to stand up quickly.  It was thought to be due to orthostatic hypotension. He had no arrhythmia noted on telemetry. He is feeling better now. He denies chest pain or worsening dyspnea. It appears that they put him back on amiodarone and also small dose of Eliquis 2.5 mg once daily. This might be for the purpose of preventing clotting in his dialysis access catheters given the difficulties he has been having with that.  Past Medical History:  Diagnosis Date   (HFpEF) heart failure with preserved ejection fraction ([place])    a. 05/2019 Echo: EF 50-55%, diast dysfxn, RVSP 56.67mmHg, Sev dil LA. Mildly dil PA.   Anemia    Anxiety    Arthritis    Colon polyps    COPD (chronic obstructive pulmonary disease) (HCC)    Coronary artery disease    a. 10/2013 NSTEMI/Cath: Severe 3VD-->CABG x 4 @ [facility] 01/2014 (LIMA->LAD, VG->OM1->OM2, VG->RCA); b. 11/2014 MV: No isch/infarct; c. 05/2019 MV: EF 40% (50-55 by echo), small,mild, fixed apical defect - ? atten vs infarct. No ischemia->low risk.   ESRD (end stage renal disease) ([place])    a. MWFSat HD   ETOH abuse    a. 2 beers/night.   GERD (gastroesophageal reflux disease)    History of pneumonia    Hyperlipidemia    Hypertension    IgA nephropathy    Lower GI bleed    a. Due to colon polyps. Status post resection of 14 polyps   Non-ST elevation MI (NSTEMI) ([place]) 2016   PAF (paroxysmal atrial fibrillation) ([place])  a. Dx 05/2019. CHA2DS2VASc = 3-->Eliquis/Amio.   Pneumonia 08/2019   Rheumatoid arthritis ([place])    Shortness of breath    WITH EXERTION   Sleep apnea    Tobacco abuse     Past Surgical History:  Procedure Laterality Date   A/V FISTULAGRAM Left 10/28/2016   Procedure: A/V Fistulagram;  Surgeon: [name], MD;  Location: [place] [facility];  Service: Cardiovascular;  Laterality: Left;   A/V FISTULAGRAM Left 12/07/2018   Procedure: A/V FISTULAGRAM;  Surgeon: [name], MD;  Location: [place] [facility];  Service: Cardiovascular;   Laterality: Left;   A/V FISTULAGRAM N/A 06/04/2019   Procedure: A/V Fistulagram- LEFT;  Surgeon: [name], MD;  Location: [place] [facility];  Service: Cardiovascular;  Laterality: N/A;   A/V SHUNT INTERVENTION N/A 10/28/2016   Procedure: A/V Shunt Intervention;  Surgeon: [name], MD;  Location: [place] [facility];  Service: Cardiovascular;  Laterality: N/A;   A/V SHUNTOGRAM Left 02/01/2019   Procedure: A/V SHUNTOGRAM;  Surgeon: [name], MD;  Location: [place] [facility];  Service: Cardiovascular;  Laterality: Left;   AV FISTULA PLACEMENT     AV FISTULA PLACEMENT Left 11/30/2019   Procedure: ARTERIOVENOUS (AV) FISTULA CREATION ( BRACHIAL CEPHALIC );  Surgeon: Katha Cabal, MD;  Location: [facility];  Service: Vascular;  Laterality: Left;   CARDIAC CATHETERIZATION     RCA 90% and calcified mid LAD 80% Stenosis   CORONARY ARTERY BYPASS GRAFT N/A 11/29/2013   Procedure: CORONARY ARTERY BYPASS GRAFTING (CABG) x 4 using endoscopically harvested right saphenous vein and left internal mammary artery;  Surgeon: [name], MD;  Location: [place];  Service: Open Heart Surgery;  Laterality: N/A;   dialysis catheterr  2/15   DIALYSIS/PERMA CATHETER INSERTION N/A 07/16/2019   Procedure: DIALYSIS/PERMA CATHETER INSERTION WITH VAC CHANGE UNDER SEDATION;  Surgeon: [name], MD;  Location: [place] [facility];  Service: Cardiovascular;  Laterality: N/A;   DIALYSIS/PERMA CATHETER INSERTION N/A 11/08/2019   Procedure: DIALYSIS/PERMA CATHETER INSERTION;  Surgeon: [name], MD;  Location: [place] [facility];  Service: Cardiovascular;  Laterality: N/A;   DIALYSIS/PERMA CATHETER REMOVAL N/A 02/15/2019   Procedure: DIALYSIS/PERMA CATHETER REMOVAL;  Surgeon: [name], MD;  Location: [place] [facility];  Service: Cardiovascular;  Laterality: N/A;   INSERTION OF DIALYSIS CATHETER N/A 12/14/2018   Procedure: INSERTION OF DIALYSIS CATHETER ( PERMCATH );  Surgeon: [name],  MD;  Location: [facility];  Service: Vascular;  Laterality: N/A;   INTRAOPERATIVE TRANSESOPHAGEAL ECHOCARDIOGRAM N/A 11/29/2013   Procedure: INTRAOPERATIVE TRANSESOPHAGEAL ECHOCARDIOGRAM;  Surgeon: [name], MD;  Location: [place] [facility];  Service: Open Heart Surgery;  Laterality: N/A;   LIGATION OF ARTERIOVENOUS  FISTULA Left 07/13/2019   Procedure: LIGATION OF ARTERIOVENOUS  FISTULA;  Surgeon: [name], MD;  Location: [facility];  Service: Vascular;  Laterality: Left;   PERIPHERAL VASCULAR CATHETERIZATION N/A 06/12/2015   Procedure: A/V Shuntogram/Fistulagram;  Surgeon: [name], MD;  Location: [place] [facility];  Service: Cardiovascular;  Laterality: N/A;   PERIPHERAL VASCULAR CATHETERIZATION Left 06/12/2015   Procedure: A/V Shunt Intervention;  Surgeon: [name], MD;  Location: [place] [facility];  Service: Cardiovascular;  Laterality: Left;   RENAL BIOPSY Left 14   REVISON OF ARTERIOVENOUS FISTULA Left 12/14/2018   Procedure: REVISON OF ARTERIOVENOUS FISTULA;  Surgeon: [name], MD;  Location: [facility];  Service: Vascular;  Laterality: Left;  UPPER EXTREMITY VENOGRAPHY Bilateral 09/18/2019   Procedure: UPPER EXTREMITY VENOGRAPHY;  Surgeon: Katha Cabal, MD;  Location: [place] [facility];  Service: Cardiovascular;  Laterality: Bilateral;     Current Outpatient Medications  Medication Sig Dispense Refill   amiodarone (PACERONE) 200 MG tablet Take 200 mg by mouth every morning.      amLODipine (NORVASC) 10 MG tablet Take 10 mg by mouth at bedtime.     apixaban (ELIQUIS) 2.5 MG TABS tablet Take 2.5 mg by mouth daily.      aspirin EC 81 MG tablet Take 1 tablet (81 mg total) by mouth daily. 90 tablet 3   atorvastatin (LIPITOR) 20 MG tablet take 1 tablet by mouth at bedtime (Patient taking differently: Take 20 mg by mouth every morning. ) 30 tablet 6   AURYXIA 1 GM 210 MG(Fe) tablet Take 420 mg by mouth 3 (three) times daily with meals.   11   calcium carbonate  (TUMS - DOSED IN MG ELEMENTAL CALCIUM) 500 MG chewable tablet Chew 1 tablet by mouth daily as needed for indigestion or heartburn.      carvedilol (COREG) 12.5 MG tablet Take 2 tablets (25 mg total) by mouth 2 (two) times daily with a meal. 180 tablet 0   cinacalcet (SENSIPAR) 30 MG tablet Take 30 mg by mouth daily with supper.      docusate sodium (COLACE) 100 MG capsule Take 1 capsule (100 mg total) by mouth 2 (two) times daily as needed for mild constipation. 10 capsule 0   Fluticasone Propionate, Inhal, (FLOVENT DISKUS) 100 MCG/BLIST AEPB Inhale 1 puff into the lungs 2 (two) times daily. (Patient taking differently: Inhale 1 puff into the lungs every morning. ) 60 each 3   Fluticasone-Umeclidin-Vilant (TRELEGY ELLIPTA) 100-62.5-25 MCG/INH AEPB Inhale 1 puff into the lungs daily. (Patient taking differently: Inhale 1 puff into the lungs every other day. ) 60 each 5   furosemide (LASIX) 80 MG tablet Take 80 mg by mouth 2 (two) times daily.      gabapentin (NEURONTIN) 100 MG capsule Take 100 mg by mouth 3 (three) times daily.   11   HYDROcodone-acetaminophen (NORCO) 5-325 MG tablet Take 1-2 tablets by mouth every 6 (six) hours as needed for moderate pain or severe pain. 25 tablet 0   loratadine (CLARITIN) 10 MG tablet Take 1 tablet (10 mg total) by mouth daily. (Patient taking differently: Take 10 mg by mouth every morning. ) 30 tablet 0   multivitamin (RENA-VIT) TABS tablet Take 1 tablet by mouth at bedtime. 30 tablet 0   ondansetron (ZOFRAN-ODT) 4 MG disintegrating tablet Take 4 mg by mouth every 8 (eight) hours as needed for nausea.      sevelamer carbonate (RENVELA) 800 MG tablet Take 800-2,400 mg by mouth See admin instructions. Take 3 tablets (2400mg ) by mouth three times daily before meals and take 1 tablet (800mg ) by mouth once daily with a snack     SPIRIVA HANDIHALER 18 MCG inhalation capsule Place 18 mcg into inhaler and inhale daily.      umeclidinium-vilanterol (ANORO  ELLIPTA) 62.5-25 MCG/INH AEPB Inhale 1 puff into the lungs daily. 14 each 3   zolpidem (AMBIEN) 10 MG tablet Take 10 mg by mouth at bedtime as needed for sleep.     No current facility-administered medications for this visit.    Allergies:   Lisinopril    Social History:  The patient  reports that he has been smoking cigarettes. He has a 16.00 pack-year smoking  history. He has never used smokeless tobacco. He reports previous alcohol use of about 7.0 standard drinks of alcohol per week. He reports that he does not use drugs.   Family History:  The patient's family history includes Healthy in his sister; Heart disease in his brother and father.    ROS:  Please see the history of present illness.   Otherwise, review of systems are positive for none.   All other systems are reviewed and negative.    PHYSICAL EXAM: VS:  BP 113/68 (BP Location: Right Arm, Patient Position: Sitting, Cuff Size: Normal)    Pulse 67    Ht 5\' 7"  (1.702 m)    Wt 154 lb 2 oz (69.9 kg)    SpO2 97%    BMI 24.14 kg/m  , BMI Body mass index is 24.14 kg/m. GEN: Well nourished, well developed, in no acute distress  HEENT: normal  Neck: no JVD, carotid bruits, or masses Cardiac: RRR; no murmurs, rubs, or gallops,no edema  Respiratory:  clear to auscultation bilaterally, normal work of breathing GI: soft, nontender, nondistended, + BS MS: no deformity or atrophy  Skin: warm and dry, no rash Neuro:  Strength and sensation are intact Psych: euthymic mood, full affect   EKG:  EKG is ordered today. The ekg ordered today demonstrates sinus rhythm with first-degree AV block.  Recent Labs: 05/28/2019: B Natriuretic Peptide >4,500.0 07/17/2019: Magnesium 2.5 10/09/2019: TSH 1.230 12/18/2019: ALT 9 12/19/2019: BUN 28; Creatinine, Ser 6.88; Hemoglobin 8.8; Platelets 270; Potassium 4.1; Sodium 135    Lipid Panel    Component Value Date/Time   CHOL 137 05/29/2019 0340   CHOL 143 10/30/2013 0543   TRIG 55 05/29/2019 0340     TRIG 62 10/30/2013 0543   HDL 72 05/29/2019 0340   HDL 65 (H) 10/30/2013 0543   CHOLHDL 1.9 05/29/2019 0340   VLDL 11 05/29/2019 0340   VLDL 12 10/30/2013 0543   LDLCALC 54 05/29/2019 0340   LDLCALC 28 11/15/2017 0850   LDLCALC 66 10/30/2013 0543      Wt Readings from Last 3 Encounters:  01/03/20 154 lb 2 oz (69.9 kg)  12/27/19 150 lb (68 kg)  12/20/19 150 lb 1.6 oz (68.1 kg)       No flowsheet data found.    ASSESSMENT AND PLAN:  1.  Coronary artery disease involving native coronary arteries without angina:  He is doing reasonably well with no anginal symptoms. Continue medical therapy.  2. Chronic diastolic heart failure: He appears to be euvolemic.  Fluid status is managed with dialysis.  3.  Paroxysmal atrial fibrillation: He had only one episode of atrial fibrillation in the setting of acute illness.  Given issues of anemia and recurrent bleeding, anticoagulation was stopped during last visit. Amiodarone is back in his medication list and this was discontinued again. Can resume it if he develops recurrent atrial fibrillation. He is on small dose Eliquis 2.5 mg once daily. This not the dosing for atrial fibrillation but it is fine to continue this if the purposes to prevent clotting in his dialysis access.  4. Hyperlipidemia:Continue treatment with atorvastatin with a target LDL of less than 70.   5. Essential hypertension: Blood pressure is well controlled. Given issues with orthostatic hypotension, we should consider decreasing the dose of amlodipine or carvedilol. He is not orthostatic today.  6.  Tobacco use: Not able to quit.   Disposition:   FU with me in 6 months  Signed,  Kathlyn Sacramento, MD  01/03/2020 8:14 AM    [facility]

## 2020-01-03 NOTE — Progress Notes (Signed)
Subjective:    Patient ID: Daryl Weeks, male    DOB: [birth year], 59 y.o.   MRN: [id number]  Daryl Weeks is a 59 y.o. male presenting on 01/03/2020 for Arm Pain (left arm there is swelling and pain x3 weeks where pt's receives dialysis. Right arm has swelling on elbow as well), Chest Pain (right side of chest where pt's dialysis is hooked up placed has some redness and pain. Patient states that it feels like burning x 2 months ), and Other (Pt is asking if he can take gabapentin twice before dialysis)   HPI  Mr. Etienne presents to clinic for evaluation of right elbow pain with some left arm swelling and right sided chest pain where his dialysis catheter is placed.    Reports has been seen in the past for this right elbow pain and was sent to general surgery without a treatment plan.  Reports he does use this arm to lean on counter tops and to push off surfaces to stand.   Depression screen [facility] 2/9 11/27/2019 08/23/2019 02/21/2018  Decreased Interest 0 0 0  Down, Depressed, Hopeless 0 0 3  PHQ - 2 Score 0 0 3  Altered sleeping - - 3  Tired, decreased energy - - 0  Change in appetite - - 0  Feeling bad or failure about yourself  - - 0  Trouble concentrating - - 2  Moving slowly or fidgety/restless - - 0  Suicidal thoughts - - 0  PHQ-9 Score - - 8  Difficult doing work/chores - - Somewhat difficult    Social History   Tobacco Use  . Smoking status: Current Every Day Smoker    Packs/day: 0.50    Years: 32.00    Pack years: 16.00    Types: Cigarettes  . Smokeless tobacco: Never Used  . Tobacco comment: daily  Substance Use Topics  . Alcohol use: Not Currently    Alcohol/week: 7.0 standard drinks    Types: 7 Cans of beer per week    Comment: 3 BEERS PER WEEK  . Drug use: No    Review of Systems  Constitutional: Negative.   HENT: Negative.   Eyes: Negative.   Respiratory: Negative.   Cardiovascular: Negative.   Gastrointestinal: Negative.   Endocrine: Negative.     Genitourinary: Negative.   Musculoskeletal: Negative.   Skin: Positive for color change. Negative for pallor, rash and wound.  Allergic/Immunologic: Negative.   Neurological: Negative.   Hematological: Negative.   Psychiatric/Behavioral: Negative.    Per HPI unless specifically indicated above     Objective:    BP (!) 109/53   Pulse 78   Temp 97.7 F (36.5 C) (Temporal)   Ht 5\' 7"  (1.702 m)   Wt 154 lb (69.9 kg)   SpO2 98%   BMI 24.12 kg/m   Wt Readings from Last 3 Encounters:  01/03/20 154 lb (69.9 kg)  01/03/20 154 lb 2 oz (69.9 kg)  12/27/19 150 lb (68 kg)    Physical Exam Vitals reviewed.  Constitutional:      General: He is not in acute distress.    Appearance: Normal appearance. He is normal weight. He is ill-appearing. He is not toxic-appearing.  HENT:     Head: Normocephalic.  Eyes:     General:        Right eye: No discharge.        Left eye: No discharge.     Extraocular Movements: Extraocular movements intact.  Conjunctiva/sclera: Conjunctivae normal.     Pupils: Pupils are equal, round, and reactive to light.  Cardiovascular:     Rate and Rhythm: Normal rate and regular rhythm.     Pulses: Normal pulses.     Heart sounds: Normal heart sounds. No murmur. No friction rub. No gallop.   Pulmonary:     Effort: Pulmonary effort is normal. No respiratory distress.     Breath sounds: Normal breath sounds.  Musculoskeletal:     Right elbow: Swelling present. No deformity, effusion or lacerations. Normal range of motion.     Comments: Right elbow olecranon bursitis with inflammation  Skin:    General: Skin is warm and dry.     Capillary Refill: Capillary refill takes less than 2 seconds.     Comments: Redness noted around right chest catheter access.  No warmth or drainage noted.    Neurological:     General: No focal deficit present.     Mental Status: He is alert and oriented to person, place, and time.     Cranial Nerves: No cranial nerve deficit.      Sensory: No sensory deficit.     Motor: No weakness.     Coordination: Coordination normal.     Gait: Gait normal.  Psychiatric:        Attention and Perception: Attention and perception normal.        Mood and Affect: Mood and affect normal.        Speech: Speech normal.        Behavior: Behavior normal. Behavior is cooperative.        Thought Content: Thought content normal.        Cognition and Memory: Cognition and memory normal.     Results for orders placed or performed during the hospital encounter of 12/18/19  SARS CORONAVIRUS 2 (TAT 6-24 HRS) Nasopharyngeal Nasopharyngeal Swab   Specimen: Nasopharyngeal Swab  Result Value Ref Range   SARS Coronavirus 2 NEGATIVE NEGATIVE  Basic metabolic panel  Result Value Ref Range   Sodium 134 (L) 135 - 145 mmol/L   Potassium 3.8 3.5 - 5.1 mmol/L   Chloride 97 (L) 98 - 111 mmol/L   CO2 25 22 - 32 mmol/L   Glucose, Bld 104 (H) 70 - 99 mg/dL   BUN 27 (H) 6 - 20 mg/dL   Creatinine, Ser 6.31 (H) 0.61 - 1.24 mg/dL   Calcium 9.0 8.9 - 10.3 mg/dL   GFR calc non Af Amer 9 (L) >60 mL/min   GFR calc Af Amer 10 (L) >60 mL/min   Anion gap 12 5 - 15  CBC  Result Value Ref Range   WBC 9.0 4.0 - 10.5 K/uL   RBC 2.56 (L) 4.22 - 5.81 MIL/uL   Hemoglobin 9.2 (L) 13.0 - 17.0 g/dL   HCT 27.4 (L) 39.0 - 52.0 %   MCV 107.0 (H) 80.0 - 100.0 fL   MCH 35.9 (H) 26.0 - 34.0 pg   MCHC 33.6 30.0 - 36.0 g/dL   RDW 18.7 (H) 11.5 - 15.5 %   Platelets 302 150 - 400 K/uL   nRBC 0.0 0.0 - 0.2 %  Hepatic function panel  Result Value Ref Range   Total Protein 6.5 6.5 - 8.1 g/dL   Albumin 2.5 (L) 3.5 - 5.0 g/dL   AST 17 15 - 41 U/L   ALT 9 0 - 44 U/L   Alkaline Phosphatase 79 38 - 126 U/L   Total Bilirubin 0.5 0.3 -  1.2 mg/dL   Bilirubin, Direct <0.1 0.0 - 0.2 mg/dL   Indirect Bilirubin NOT CALCULATED 0.3 - 0.9 mg/dL  Lipase, blood  Result Value Ref Range   Lipase 45 11 - 51 U/L  Ethanol  Result Value Ref Range   Alcohol, Ethyl (B) <10 <10 mg/dL   CK  Result Value Ref Range   Total CK 66 49 - 397 U/L  CBC  Result Value Ref Range   WBC 9.5 4.0 - 10.5 K/uL   RBC 2.47 (L) 4.22 - 5.81 MIL/uL   Hemoglobin 8.8 (L) 13.0 - 17.0 g/dL   HCT 26.0 (L) 39.0 - 52.0 %   MCV 105.3 (H) 80.0 - 100.0 fL   MCH 35.6 (H) 26.0 - 34.0 pg   MCHC 33.8 30.0 - 36.0 g/dL   RDW 18.5 (H) 11.5 - 15.5 %   Platelets 270 150 - 400 K/uL   nRBC 0.0 0.0 - 0.2 %  Basic metabolic panel  Result Value Ref Range   Sodium 135 135 - 145 mmol/L   Potassium 4.1 3.5 - 5.1 mmol/L   Chloride 98 98 - 111 mmol/L   CO2 23 22 - 32 mmol/L   Glucose, Bld 82 70 - 99 mg/dL   BUN 28 (H) 6 - 20 mg/dL   Creatinine, Ser 6.88 (H) 0.61 - 1.24 mg/dL   Calcium 8.9 8.9 - 10.3 mg/dL   GFR calc non Af Amer 8 (L) >60 mL/min   GFR calc Af Amer 9 (L) >60 mL/min   Anion gap 14 5 - 15  Glucose, capillary  Result Value Ref Range   Glucose-Capillary 75 70 - 99 mg/dL   Comment 1 Notify RN   Troponin I (High Sensitivity)  Result Value Ref Range   Troponin I (High Sensitivity) 48 (H) <18 ng/L  Troponin I (High Sensitivity)  Result Value Ref Range   Troponin I (High Sensitivity) 52 (H) <18 ng/L      Assessment & Plan:   Problem List Items Addressed This Visit      Musculoskeletal and Integument   Olecranon bursitis, right elbow    Discussed likely needs to see Orthopedics for evaluation and treatment, which may include in office drainage of the inflamed bursa vs. Surgery or watchful waiting.  Referral placed for Orthopedics and contact information given for [facility] if we would like to proceed through their Urgent Care Walk In center.        Other   Redness of skin    Discussed redness around right chest dialysis catheter.  Discussed will need to follow up with provider that placed this for evaluation and additional treatment.  No acute s/s of infection other than redness around catheter entry.  Patient reports has dialysis tomorrow morning and will show them when he  arrives. Strict ER precautions given.       Other Visit Diagnoses    Olecranon bursitis of right elbow    -  Primary   Relevant Orders   AMB referral to orthopedics      No orders of the defined types were placed in this encounter.     Follow up plan: Return if symptoms worsen or fail to improve.   Harlin Rain, [place] Family Nurse Practitioner [facility] 01/03/2020, 3:07 PM

## 2020-01-03 NOTE — Patient Instructions (Addendum)
I have put in a referral to orthopedics for your right elbow olecranon bursitis for evaluation and treatment.  If you decide to not to wait for the referral to call you, can check out [facility] at [place] in [place], they are open Monday-Friday 8am-7:30pm.  Contact Dr. Nino Parsley office to have your catheter site evaluated as it has redness today without a dressing and would like their opinion regarding treatment.  Discussed ER precautions such as worsening redness, drainage, fevers, not feeling well to proceed to the emergency room immediately.  We will see you back as needed for this.  You will receive a survey after today's visit either digitally by e-mail or paper by [employer]. Your experiences and feedback matter to [place].  Please respond so we know how we are doing as we provide care for you.  Call us with any questions/concerns/needs.  It is my goal to be available to you for your health concerns.  Thanks for choosing me to be a partner in your healthcare needs!  Harlin Rain, FNP-C Family Nurse Practitioner [facility] Phone: [phone]

## 2020-01-04 DIAGNOSIS — Z23 Encounter for immunization: Secondary | ICD-10-CM | POA: Diagnosis not present

## 2020-01-04 DIAGNOSIS — D509 Iron deficiency anemia, unspecified: Secondary | ICD-10-CM | POA: Diagnosis not present

## 2020-01-04 DIAGNOSIS — N186 End stage renal disease: Secondary | ICD-10-CM | POA: Diagnosis not present

## 2020-01-04 DIAGNOSIS — D631 Anemia in chronic kidney disease: Secondary | ICD-10-CM | POA: Diagnosis not present

## 2020-01-04 DIAGNOSIS — Z992 Dependence on renal dialysis: Secondary | ICD-10-CM | POA: Diagnosis not present

## 2020-01-04 DIAGNOSIS — N2581 Secondary hyperparathyroidism of renal origin: Secondary | ICD-10-CM | POA: Diagnosis not present

## 2020-01-07 DIAGNOSIS — D509 Iron deficiency anemia, unspecified: Secondary | ICD-10-CM | POA: Diagnosis not present

## 2020-01-07 DIAGNOSIS — Z23 Encounter for immunization: Secondary | ICD-10-CM | POA: Diagnosis not present

## 2020-01-07 DIAGNOSIS — D631 Anemia in chronic kidney disease: Secondary | ICD-10-CM | POA: Diagnosis not present

## 2020-01-07 DIAGNOSIS — N186 End stage renal disease: Secondary | ICD-10-CM | POA: Diagnosis not present

## 2020-01-07 DIAGNOSIS — Z992 Dependence on renal dialysis: Secondary | ICD-10-CM | POA: Diagnosis not present

## 2020-01-07 DIAGNOSIS — N2581 Secondary hyperparathyroidism of renal origin: Secondary | ICD-10-CM | POA: Diagnosis not present

## 2020-01-08 ENCOUNTER — Telehealth: Payer: Self-pay | Admitting: Family Medicine

## 2020-01-08 DIAGNOSIS — I132 Hypertensive heart and chronic kidney disease with heart failure and with stage 5 chronic kidney disease, or end stage renal disease: Secondary | ICD-10-CM | POA: Diagnosis not present

## 2020-01-08 DIAGNOSIS — N186 End stage renal disease: Secondary | ICD-10-CM | POA: Diagnosis not present

## 2020-01-08 DIAGNOSIS — D631 Anemia in chronic kidney disease: Secondary | ICD-10-CM | POA: Diagnosis not present

## 2020-01-08 DIAGNOSIS — Z992 Dependence on renal dialysis: Secondary | ICD-10-CM | POA: Diagnosis not present

## 2020-01-08 DIAGNOSIS — I5032 Chronic diastolic (congestive) heart failure: Secondary | ICD-10-CM | POA: Diagnosis not present

## 2020-01-08 DIAGNOSIS — J449 Chronic obstructive pulmonary disease, unspecified: Secondary | ICD-10-CM | POA: Diagnosis not present

## 2020-01-08 NOTE — Telephone Encounter (Signed)
Home Health Verbal Orders - Caller/Agency: Ria Comment at [facility]  Callback Number: [phone]  Called to report that he a fall on Sunday and is having ear pain.  He is out of his pain medication Hydrocodone 5/325  This is just an FYI and to fill his script.  He uses [facility] [address] in [place]

## 2020-01-09 ENCOUNTER — Other Ambulatory Visit: Payer: Self-pay

## 2020-01-09 DIAGNOSIS — D509 Iron deficiency anemia, unspecified: Secondary | ICD-10-CM | POA: Diagnosis not present

## 2020-01-09 DIAGNOSIS — N186 End stage renal disease: Secondary | ICD-10-CM | POA: Diagnosis not present

## 2020-01-09 DIAGNOSIS — N2581 Secondary hyperparathyroidism of renal origin: Secondary | ICD-10-CM | POA: Diagnosis not present

## 2020-01-09 DIAGNOSIS — Z23 Encounter for immunization: Secondary | ICD-10-CM | POA: Diagnosis not present

## 2020-01-09 DIAGNOSIS — D631 Anemia in chronic kidney disease: Secondary | ICD-10-CM | POA: Diagnosis not present

## 2020-01-09 DIAGNOSIS — Z992 Dependence on renal dialysis: Secondary | ICD-10-CM | POA: Diagnosis not present

## 2020-01-09 NOTE — Telephone Encounter (Signed)
I called the patient and he concern he could possible have an ear infection. He complaining of ear pain x 3-4 days. He also reports falling on Sunday and then again yesterday. He denies any injury, but report that he just loses his balance while walking.   He is also requesting a refill on his hydrocodone 5/325 for his elbow pain post surgery. He state that he is currently taking Tylenol Extra strength 650 MG a total 12 a day.   The pt was scheduled for an appt to address the fall and ear pain. I spoke with Elmyra Ricks and she verbalize that he can only take a Qty 4 Tylenol Extra Strength 650MG  daily. She also recommended that he discuss a refill of his pain medication with his Ortho provider on tomorrow.

## 2020-01-09 NOTE — Telephone Encounter (Signed)
The pt was notified of recommendation, no questions or concern.

## 2020-01-09 NOTE — Patient Outreach (Signed)
[facility]) Care Management  01/09/2020  HONG MORING 08/28/1961 [id number]   Red Emmi- case closure  Unable to reach patient x 3 attempts and letter.   Case closed as unable to reach.  Tomasa Rand, RN, BSN, CEN [facility] [employer] [phone]

## 2020-01-10 ENCOUNTER — Ambulatory Visit: Payer: Self-pay | Admitting: Family Medicine

## 2020-01-10 DIAGNOSIS — M25562 Pain in left knee: Secondary | ICD-10-CM | POA: Diagnosis not present

## 2020-01-10 DIAGNOSIS — S86912A Strain of unspecified muscle(s) and tendon(s) at lower leg level, left leg, initial encounter: Secondary | ICD-10-CM | POA: Diagnosis not present

## 2020-01-10 DIAGNOSIS — R0781 Pleurodynia: Secondary | ICD-10-CM | POA: Diagnosis not present

## 2020-01-10 DIAGNOSIS — S20211A Contusion of right front wall of thorax, initial encounter: Secondary | ICD-10-CM | POA: Diagnosis not present

## 2020-01-11 ENCOUNTER — Other Ambulatory Visit
Admission: RE | Admit: 2020-01-11 | Discharge: 2020-01-11 | Disposition: A | Discharge status: Home / Self Care | Payer: Medicare Other | Source: Ambulatory Visit | Attending: Nephrology | Admitting: Nephrology

## 2020-01-11 DIAGNOSIS — N186 End stage renal disease: Secondary | ICD-10-CM | POA: Diagnosis not present

## 2020-01-11 DIAGNOSIS — D631 Anemia in chronic kidney disease: Secondary | ICD-10-CM | POA: Diagnosis not present

## 2020-01-11 DIAGNOSIS — D509 Iron deficiency anemia, unspecified: Secondary | ICD-10-CM | POA: Diagnosis not present

## 2020-01-11 DIAGNOSIS — N2581 Secondary hyperparathyroidism of renal origin: Secondary | ICD-10-CM | POA: Diagnosis not present

## 2020-01-11 DIAGNOSIS — Z23 Encounter for immunization: Secondary | ICD-10-CM | POA: Diagnosis not present

## 2020-01-11 DIAGNOSIS — Z992 Dependence on renal dialysis: Secondary | ICD-10-CM | POA: Diagnosis not present

## 2020-01-11 LAB — POTASSIUM: Potassium: 5.2 mmol/L — ABNORMAL HIGH (ref 3.5–5.1)

## 2020-01-12 DIAGNOSIS — N186 End stage renal disease: Secondary | ICD-10-CM | POA: Diagnosis not present

## 2020-01-12 DIAGNOSIS — D509 Iron deficiency anemia, unspecified: Secondary | ICD-10-CM | POA: Diagnosis not present

## 2020-01-12 DIAGNOSIS — D631 Anemia in chronic kidney disease: Secondary | ICD-10-CM | POA: Diagnosis not present

## 2020-01-12 DIAGNOSIS — N2581 Secondary hyperparathyroidism of renal origin: Secondary | ICD-10-CM | POA: Diagnosis not present

## 2020-01-12 DIAGNOSIS — Z992 Dependence on renal dialysis: Secondary | ICD-10-CM | POA: Diagnosis not present

## 2020-01-12 DIAGNOSIS — E8779 Other fluid overload: Secondary | ICD-10-CM | POA: Diagnosis not present

## 2020-01-14 DIAGNOSIS — E8779 Other fluid overload: Secondary | ICD-10-CM | POA: Diagnosis not present

## 2020-01-14 DIAGNOSIS — D631 Anemia in chronic kidney disease: Secondary | ICD-10-CM | POA: Diagnosis not present

## 2020-01-14 DIAGNOSIS — N2581 Secondary hyperparathyroidism of renal origin: Secondary | ICD-10-CM | POA: Diagnosis not present

## 2020-01-14 DIAGNOSIS — I132 Hypertensive heart and chronic kidney disease with heart failure and with stage 5 chronic kidney disease, or end stage renal disease: Secondary | ICD-10-CM | POA: Diagnosis not present

## 2020-01-14 DIAGNOSIS — I5032 Chronic diastolic (congestive) heart failure: Secondary | ICD-10-CM | POA: Diagnosis not present

## 2020-01-14 DIAGNOSIS — D509 Iron deficiency anemia, unspecified: Secondary | ICD-10-CM | POA: Diagnosis not present

## 2020-01-14 DIAGNOSIS — N186 End stage renal disease: Secondary | ICD-10-CM | POA: Diagnosis not present

## 2020-01-14 DIAGNOSIS — Z992 Dependence on renal dialysis: Secondary | ICD-10-CM | POA: Diagnosis not present

## 2020-01-14 DIAGNOSIS — J449 Chronic obstructive pulmonary disease, unspecified: Secondary | ICD-10-CM | POA: Diagnosis not present

## 2020-01-15 DIAGNOSIS — I132 Hypertensive heart and chronic kidney disease with heart failure and with stage 5 chronic kidney disease, or end stage renal disease: Secondary | ICD-10-CM | POA: Diagnosis not present

## 2020-01-15 DIAGNOSIS — Z992 Dependence on renal dialysis: Secondary | ICD-10-CM | POA: Diagnosis not present

## 2020-01-15 DIAGNOSIS — J449 Chronic obstructive pulmonary disease, unspecified: Secondary | ICD-10-CM | POA: Diagnosis not present

## 2020-01-15 DIAGNOSIS — N186 End stage renal disease: Secondary | ICD-10-CM | POA: Diagnosis not present

## 2020-01-15 DIAGNOSIS — D631 Anemia in chronic kidney disease: Secondary | ICD-10-CM | POA: Diagnosis not present

## 2020-01-15 DIAGNOSIS — I5032 Chronic diastolic (congestive) heart failure: Secondary | ICD-10-CM | POA: Diagnosis not present

## 2020-01-16 ENCOUNTER — Emergency Department: Payer: Medicare Other

## 2020-01-16 ENCOUNTER — Inpatient Hospital Stay: Payer: Medicare Other

## 2020-01-16 ENCOUNTER — Other Ambulatory Visit: Payer: Self-pay

## 2020-01-16 ENCOUNTER — Encounter: Payer: Self-pay | Admitting: Internal Medicine

## 2020-01-16 ENCOUNTER — Inpatient Hospital Stay
Admission: EM | Admit: 2020-01-16 | Discharge: 2020-01-18 | DRG: 190 | Disposition: A | Discharge status: Home / Self Care | Payer: Medicare Other | Attending: Internal Medicine | Admitting: Internal Medicine

## 2020-01-16 DIAGNOSIS — I2510 Atherosclerotic heart disease of native coronary artery without angina pectoris: Secondary | ICD-10-CM | POA: Diagnosis present

## 2020-01-16 DIAGNOSIS — N2581 Secondary hyperparathyroidism of renal origin: Secondary | ICD-10-CM | POA: Diagnosis not present

## 2020-01-16 DIAGNOSIS — R069 Unspecified abnormalities of breathing: Secondary | ICD-10-CM | POA: Diagnosis not present

## 2020-01-16 DIAGNOSIS — I120 Hypertensive chronic kidney disease with stage 5 chronic kidney disease or end stage renal disease: Secondary | ICD-10-CM | POA: Diagnosis not present

## 2020-01-16 DIAGNOSIS — R062 Wheezing: Secondary | ICD-10-CM | POA: Diagnosis not present

## 2020-01-16 DIAGNOSIS — Z8701 Personal history of pneumonia (recurrent): Secondary | ICD-10-CM

## 2020-01-16 DIAGNOSIS — Z951 Presence of aortocoronary bypass graft: Secondary | ICD-10-CM | POA: Diagnosis not present

## 2020-01-16 DIAGNOSIS — I482 Chronic atrial fibrillation, unspecified: Secondary | ICD-10-CM | POA: Diagnosis present

## 2020-01-16 DIAGNOSIS — F17210 Nicotine dependence, cigarettes, uncomplicated: Secondary | ICD-10-CM | POA: Diagnosis present

## 2020-01-16 DIAGNOSIS — D631 Anemia in chronic kidney disease: Secondary | ICD-10-CM | POA: Diagnosis not present

## 2020-01-16 DIAGNOSIS — I10 Essential (primary) hypertension: Secondary | ICD-10-CM | POA: Diagnosis not present

## 2020-01-16 DIAGNOSIS — I5032 Chronic diastolic (congestive) heart failure: Secondary | ICD-10-CM | POA: Diagnosis present

## 2020-01-16 DIAGNOSIS — I132 Hypertensive heart and chronic kidney disease with heart failure and with stage 5 chronic kidney disease, or end stage renal disease: Secondary | ICD-10-CM | POA: Diagnosis present

## 2020-01-16 DIAGNOSIS — Z8249 Family history of ischemic heart disease and other diseases of the circulatory system: Secondary | ICD-10-CM | POA: Diagnosis not present

## 2020-01-16 DIAGNOSIS — I252 Old myocardial infarction: Secondary | ICD-10-CM | POA: Diagnosis not present

## 2020-01-16 DIAGNOSIS — F419 Anxiety disorder, unspecified: Secondary | ICD-10-CM | POA: Diagnosis present

## 2020-01-16 DIAGNOSIS — R0602 Shortness of breath: Secondary | ICD-10-CM | POA: Diagnosis not present

## 2020-01-16 DIAGNOSIS — M069 Rheumatoid arthritis, unspecified: Secondary | ICD-10-CM | POA: Diagnosis present

## 2020-01-16 DIAGNOSIS — Z720 Tobacco use: Secondary | ICD-10-CM | POA: Diagnosis not present

## 2020-01-16 DIAGNOSIS — I700 Atherosclerosis of aorta: Secondary | ICD-10-CM | POA: Diagnosis present

## 2020-01-16 DIAGNOSIS — W19XXXA Unspecified fall, initial encounter: Secondary | ICD-10-CM | POA: Diagnosis not present

## 2020-01-16 DIAGNOSIS — I480 Paroxysmal atrial fibrillation: Secondary | ICD-10-CM | POA: Diagnosis present

## 2020-01-16 DIAGNOSIS — D509 Iron deficiency anemia, unspecified: Secondary | ICD-10-CM | POA: Diagnosis not present

## 2020-01-16 DIAGNOSIS — J9621 Acute and chronic respiratory failure with hypoxia: Secondary | ICD-10-CM | POA: Diagnosis present

## 2020-01-16 DIAGNOSIS — N186 End stage renal disease: Secondary | ICD-10-CM

## 2020-01-16 DIAGNOSIS — E785 Hyperlipidemia, unspecified: Secondary | ICD-10-CM | POA: Diagnosis present

## 2020-01-16 DIAGNOSIS — K219 Gastro-esophageal reflux disease without esophagitis: Secondary | ICD-10-CM | POA: Diagnosis present

## 2020-01-16 DIAGNOSIS — Z7982 Long term (current) use of aspirin: Secondary | ICD-10-CM | POA: Diagnosis not present

## 2020-01-16 DIAGNOSIS — R519 Headache, unspecified: Secondary | ICD-10-CM | POA: Diagnosis not present

## 2020-01-16 DIAGNOSIS — Z992 Dependence on renal dialysis: Secondary | ICD-10-CM

## 2020-01-16 DIAGNOSIS — E8779 Other fluid overload: Secondary | ICD-10-CM | POA: Diagnosis not present

## 2020-01-16 DIAGNOSIS — Z20822 Contact with and (suspected) exposure to covid-19: Secondary | ICD-10-CM | POA: Diagnosis present

## 2020-01-16 DIAGNOSIS — F1010 Alcohol abuse, uncomplicated: Secondary | ICD-10-CM | POA: Diagnosis present

## 2020-01-16 DIAGNOSIS — Z7901 Long term (current) use of anticoagulants: Secondary | ICD-10-CM | POA: Diagnosis not present

## 2020-01-16 DIAGNOSIS — J189 Pneumonia, unspecified organism: Secondary | ICD-10-CM | POA: Diagnosis not present

## 2020-01-16 DIAGNOSIS — J441 Chronic obstructive pulmonary disease with (acute) exacerbation: Principal | ICD-10-CM | POA: Diagnosis present

## 2020-01-16 DIAGNOSIS — R079 Chest pain, unspecified: Secondary | ICD-10-CM | POA: Diagnosis not present

## 2020-01-16 DIAGNOSIS — Z888 Allergy status to other drugs, medicaments and biological substances status: Secondary | ICD-10-CM

## 2020-01-16 DIAGNOSIS — R0902 Hypoxemia: Secondary | ICD-10-CM

## 2020-01-16 DIAGNOSIS — Z79899 Other long term (current) drug therapy: Secondary | ICD-10-CM

## 2020-01-16 DIAGNOSIS — R0600 Dyspnea, unspecified: Secondary | ICD-10-CM

## 2020-01-16 DIAGNOSIS — S0990XA Unspecified injury of head, initial encounter: Secondary | ICD-10-CM | POA: Diagnosis not present

## 2020-01-16 LAB — BLOOD GAS, VENOUS
Acid-Base Excess: 3.9 mmol/L — ABNORMAL HIGH (ref 0.0–2.0)
Bicarbonate: 28.5 mmol/L — ABNORMAL HIGH (ref 20.0–28.0)
O2 Saturation: 84.3 %
Patient temperature: 37
pCO2, Ven: 42 mmHg — ABNORMAL LOW (ref 44.0–60.0)
pH, Ven: 7.44 — ABNORMAL HIGH (ref 7.250–7.430)
pO2, Ven: 47 mmHg — ABNORMAL HIGH (ref 32.0–45.0)

## 2020-01-16 LAB — BRAIN NATRIURETIC PEPTIDE: B Natriuretic Peptide: 561 pg/mL — ABNORMAL HIGH (ref 0.0–100.0)

## 2020-01-16 LAB — CBC WITH DIFFERENTIAL/PLATELET
Abs Immature Granulocytes: 0.16 10*3/uL — ABNORMAL HIGH (ref 0.00–0.07)
Basophils Absolute: 0 10*3/uL (ref 0.0–0.1)
Basophils Relative: 0 %
Eosinophils Absolute: 0.1 10*3/uL (ref 0.0–0.5)
Eosinophils Relative: 0 %
HCT: 27.8 % — ABNORMAL LOW (ref 39.0–52.0)
Hemoglobin: 9.4 g/dL — ABNORMAL LOW (ref 13.0–17.0)
Immature Granulocytes: 1 %
Lymphocytes Relative: 12 %
Lymphs Abs: 1.8 10*3/uL (ref 0.7–4.0)
MCH: 36.4 pg — ABNORMAL HIGH (ref 26.0–34.0)
MCHC: 33.8 g/dL (ref 30.0–36.0)
MCV: 107.8 fL — ABNORMAL HIGH (ref 80.0–100.0)
Monocytes Absolute: 2.2 10*3/uL — ABNORMAL HIGH (ref 0.1–1.0)
Monocytes Relative: 14 %
Neutro Abs: 11.4 10*3/uL — ABNORMAL HIGH (ref 1.7–7.7)
Neutrophils Relative %: 73 %
Platelets: 449 10*3/uL — ABNORMAL HIGH (ref 150–400)
RBC: 2.58 MIL/uL — ABNORMAL LOW (ref 4.22–5.81)
RDW: 15 % (ref 11.5–15.5)
WBC: 15.7 10*3/uL — ABNORMAL HIGH (ref 4.0–10.5)
nRBC: 0 % (ref 0.0–0.2)

## 2020-01-16 LAB — BASIC METABOLIC PANEL
Anion gap: 13 (ref 5–15)
BUN: 18 mg/dL (ref 6–20)
CO2: 25 mmol/L (ref 22–32)
Calcium: 8.5 mg/dL — ABNORMAL LOW (ref 8.9–10.3)
Chloride: 96 mmol/L — ABNORMAL LOW (ref 98–111)
Creatinine, Ser: 3.68 mg/dL — ABNORMAL HIGH (ref 0.61–1.24)
GFR calc Af Amer: 20 mL/min — ABNORMAL LOW (ref >60)
GFR calc non Af Amer: 17 mL/min — ABNORMAL LOW (ref >60)
Glucose, Bld: 92 mg/dL (ref 70–99)
Potassium: 3 mmol/L — ABNORMAL LOW (ref 3.5–5.1)
Sodium: 134 mmol/L — ABNORMAL LOW (ref 135–145)

## 2020-01-16 LAB — RESPIRATORY PANEL BY RT PCR (FLU A&B, COVID)
Influenza A by PCR: NEGATIVE
Influenza B by PCR: NEGATIVE
SARS Coronavirus 2 by RT PCR: NEGATIVE

## 2020-01-16 LAB — TROPONIN I (HIGH SENSITIVITY): Troponin I (High Sensitivity): 42 ng/L — ABNORMAL HIGH (ref <18)

## 2020-01-16 MED ORDER — LORATADINE 10 MG PO TABS
10.0000 mg | ORAL_TABLET | ORAL | Status: DC
  Administered 2020-01-17 – 2020-01-18 (×2): 10 mg via ORAL
  Filled 2020-01-16 (×2): qty 1

## 2020-01-16 MED ORDER — HYDROCODONE-ACETAMINOPHEN 5-325 MG PO TABS: 1.0000 | ORAL_TABLET | Freq: Four times a day (QID) | ORAL | Status: DC | PRN

## 2020-01-16 MED ORDER — SEVELAMER CARBONATE 800 MG PO TABS
800.0000 mg | ORAL_TABLET | Freq: Every day | ORAL | Status: DC
  Filled 2020-01-16: qty 1

## 2020-01-16 MED ORDER — ATORVASTATIN CALCIUM 20 MG PO TABS: 20.0000 mg | ORAL_TABLET | ORAL | Status: DC

## 2020-01-16 MED ORDER — CALCIUM CARBONATE ANTACID 500 MG PO CHEW: 1.0000 | CHEWABLE_TABLET | Freq: Every day | ORAL | Status: DC | PRN

## 2020-01-16 MED ORDER — THIAMINE HCL 100 MG PO TABS
100.0000 mg | ORAL_TABLET | Freq: Every day | ORAL | Status: DC
  Administered 2020-01-16 – 2020-01-18 (×3): 100 mg via ORAL
  Filled 2020-01-16 (×3): qty 1

## 2020-01-16 MED ORDER — CARVEDILOL 25 MG PO TABS
25.0000 mg | ORAL_TABLET | Freq: Two times a day (BID) | ORAL | Status: DC
  Administered 2020-01-17 (×2): 25 mg via ORAL
  Filled 2020-01-16 (×3): qty 1

## 2020-01-16 MED ORDER — LORAZEPAM 2 MG/ML IJ SOLN: 0.0000 mg | Freq: Four times a day (QID) | INTRAMUSCULAR | Status: DC

## 2020-01-16 MED ORDER — APIXABAN 2.5 MG PO TABS
2.5000 mg | ORAL_TABLET | Freq: Every day | ORAL | Status: DC
  Administered 2020-01-16: 23:00:00 2.5 mg via ORAL
  Filled 2020-01-16 (×2): qty 1

## 2020-01-16 MED ORDER — ATORVASTATIN CALCIUM 20 MG PO TABS
20.0000 mg | ORAL_TABLET | ORAL | Status: DC
  Administered 2020-01-17: 17:00:00 20 mg via ORAL
  Filled 2020-01-16: qty 1

## 2020-01-16 MED ORDER — METHYLPREDNISOLONE SODIUM SUCC 40 MG IJ SOLR
40.0000 mg | Freq: Two times a day (BID) | INTRAMUSCULAR | Status: DC
  Administered 2020-01-16 – 2020-01-17 (×2): 40 mg via INTRAVENOUS
  Filled 2020-01-16 (×2): qty 1

## 2020-01-16 MED ORDER — TECHNETIUM TO 99M ALBUMIN AGGREGATED
5.3200 | Freq: Once | INTRAVENOUS | Status: AC | PRN
  Administered 2020-01-16: 5.32 via INTRAVENOUS

## 2020-01-16 MED ORDER — FERRIC CITRATE 1 GM 210 MG(FE) PO TABS
420.0000 mg | ORAL_TABLET | Freq: Three times a day (TID) | ORAL | Status: DC
  Administered 2020-01-17 – 2020-01-18 (×2): 420 mg via ORAL
  Filled 2020-01-16 (×8): qty 2

## 2020-01-16 MED ORDER — ONDANSETRON HCL 4 MG/2ML IJ SOLN: 4.0000 mg | Freq: Three times a day (TID) | INTRAMUSCULAR | Status: DC | PRN

## 2020-01-16 MED ORDER — GABAPENTIN 100 MG PO CAPS
100.0000 mg | ORAL_CAPSULE | Freq: Three times a day (TID) | ORAL | Status: DC
  Administered 2020-01-16 – 2020-01-18 (×5): 100 mg via ORAL
  Filled 2020-01-16 (×6): qty 1

## 2020-01-16 MED ORDER — CINACALCET HCL 30 MG PO TABS
30.0000 mg | ORAL_TABLET | Freq: Every day | ORAL | Status: DC
  Administered 2020-01-17: 30 mg via ORAL
  Filled 2020-01-16 (×2): qty 1

## 2020-01-16 MED ORDER — POTASSIUM CHLORIDE CRYS ER 20 MEQ PO TBCR
30.0000 meq | EXTENDED_RELEASE_TABLET | Freq: Once | ORAL | Status: AC
  Administered 2020-01-16: 30 meq via ORAL
  Filled 2020-01-16: qty 2

## 2020-01-16 MED ORDER — METHYLPREDNISOLONE SODIUM SUCC 125 MG IJ SOLR
125.0000 mg | Freq: Once | INTRAMUSCULAR | Status: AC
  Administered 2020-01-16: 13:00:00 125 mg via INTRAVENOUS
  Filled 2020-01-16: qty 2

## 2020-01-16 MED ORDER — IPRATROPIUM-ALBUTEROL 0.5-2.5 (3) MG/3ML IN SOLN
3.0000 mL | RESPIRATORY_TRACT | Status: DC
  Administered 2020-01-16 – 2020-01-17 (×6): 3 mL via RESPIRATORY_TRACT
  Filled 2020-01-16 (×6): qty 3

## 2020-01-16 MED ORDER — ZOLPIDEM TARTRATE 5 MG PO TABS
10.0000 mg | ORAL_TABLET | Freq: Every evening | ORAL | Status: DC | PRN
  Administered 2020-01-16 – 2020-01-17 (×2): 10 mg via ORAL
  Filled 2020-01-16 (×2): qty 2

## 2020-01-16 MED ORDER — FLUTICASONE-UMECLIDIN-VILANT 100-62.5-25 MCG/INH IN AEPB: 1.0000 | INHALATION_SPRAY | RESPIRATORY_TRACT | Status: DC

## 2020-01-16 MED ORDER — TECHNETIUM TC 99M DIETHYLENETRIAME-PENTAACETIC ACID
32.8200 | Freq: Once | INTRAVENOUS | Status: AC | PRN
  Administered 2020-01-16: 14:00:00 32.82 via INTRAVENOUS

## 2020-01-16 MED ORDER — SEVELAMER CARBONATE 800 MG PO TABS: 800.0000 mg | ORAL_TABLET | ORAL | Status: DC

## 2020-01-16 MED ORDER — ASPIRIN EC 81 MG PO TBEC
81.0000 mg | DELAYED_RELEASE_TABLET | Freq: Every day | ORAL | Status: DC
  Administered 2020-01-16 – 2020-01-18 (×3): 81 mg via ORAL
  Filled 2020-01-16 (×3): qty 1

## 2020-01-16 MED ORDER — FUROSEMIDE 40 MG PO TABS
80.0000 mg | ORAL_TABLET | Freq: Two times a day (BID) | ORAL | Status: DC
  Administered 2020-01-17 – 2020-01-18 (×2): 80 mg via ORAL
  Filled 2020-01-16 (×2): qty 2

## 2020-01-16 MED ORDER — FUROSEMIDE 40 MG PO TABS: 80.0000 mg | ORAL_TABLET | Freq: Two times a day (BID) | ORAL | Status: DC

## 2020-01-16 MED ORDER — ADULT MULTIVITAMIN W/MINERALS CH
1.0000 | ORAL_TABLET | Freq: Every day | ORAL | Status: DC
  Administered 2020-01-16 – 2020-01-18 (×3): 1 via ORAL
  Filled 2020-01-16 (×3): qty 1

## 2020-01-16 MED ORDER — AMLODIPINE BESYLATE 10 MG PO TABS
10.0000 mg | ORAL_TABLET | Freq: Every day | ORAL | Status: DC
  Administered 2020-01-16: 22:00:00 10 mg via ORAL
  Filled 2020-01-16: qty 2

## 2020-01-16 MED ORDER — RENA-VITE PO TABS
1.0000 | ORAL_TABLET | Freq: Every day | ORAL | Status: DC
  Administered 2020-01-16 – 2020-01-17 (×2): 1 via ORAL
  Filled 2020-01-16 (×3): qty 1

## 2020-01-16 MED ORDER — LORAZEPAM 2 MG/ML IJ SOLN: 0.0000 mg | Freq: Two times a day (BID) | INTRAMUSCULAR | Status: DC

## 2020-01-16 MED ORDER — NICOTINE 21 MG/24HR TD PT24
21.0000 mg | MEDICATED_PATCH | Freq: Every day | TRANSDERMAL | Status: DC
  Administered 2020-01-16 – 2020-01-18 (×3): 21 mg via TRANSDERMAL
  Filled 2020-01-16 (×3): qty 1

## 2020-01-16 MED ORDER — UMECLIDINIUM BROMIDE 62.5 MCG/INH IN AEPB
1.0000 | INHALATION_SPRAY | Freq: Every day | RESPIRATORY_TRACT | Status: DC
  Administered 2020-01-17 – 2020-01-18 (×2): 1 via RESPIRATORY_TRACT
  Filled 2020-01-16: qty 7

## 2020-01-16 MED ORDER — DOCUSATE SODIUM 100 MG PO CAPS: 100.0000 mg | ORAL_CAPSULE | Freq: Two times a day (BID) | ORAL | Status: DC | PRN

## 2020-01-16 MED ORDER — AZITHROMYCIN 500 MG PO TABS
500.0000 mg | ORAL_TABLET | Freq: Every day | ORAL | Status: AC
  Administered 2020-01-16: 500 mg via ORAL
  Filled 2020-01-16 (×2): qty 1

## 2020-01-16 MED ORDER — THIAMINE HCL 100 MG/ML IJ SOLN: 100.0000 mg | Freq: Every day | INTRAMUSCULAR | Status: DC

## 2020-01-16 MED ORDER — SEVELAMER CARBONATE 800 MG PO TABS
2400.0000 mg | ORAL_TABLET | Freq: Three times a day (TID) | ORAL | Status: DC
  Administered 2020-01-17 – 2020-01-18 (×4): 2400 mg via ORAL
  Filled 2020-01-16 (×6): qty 3

## 2020-01-16 MED ORDER — IPRATROPIUM-ALBUTEROL 0.5-2.5 (3) MG/3ML IN SOLN
3.0000 mL | Freq: Once | RESPIRATORY_TRACT | Status: AC
  Administered 2020-01-16: 13:00:00 3 mL via RESPIRATORY_TRACT
  Filled 2020-01-16: qty 3

## 2020-01-16 MED ORDER — AZITHROMYCIN 250 MG PO TABS
250.0000 mg | ORAL_TABLET | Freq: Every day | ORAL | Status: DC
  Administered 2020-01-17 – 2020-01-18 (×2): 250 mg via ORAL
  Filled 2020-01-16 (×2): qty 1

## 2020-01-16 MED ORDER — ALBUTEROL SULFATE (2.5 MG/3ML) 0.083% IN NEBU: 2.5000 mg | INHALATION_SOLUTION | RESPIRATORY_TRACT | Status: DC | PRN

## 2020-01-16 MED ORDER — ACETAMINOPHEN 325 MG PO TABS
650.0000 mg | ORAL_TABLET | Freq: Four times a day (QID) | ORAL | Status: DC | PRN
  Administered 2020-01-17: 650 mg via ORAL
  Filled 2020-01-16: qty 2

## 2020-01-16 MED ORDER — FOLIC ACID 1 MG PO TABS
1.0000 mg | ORAL_TABLET | Freq: Every day | ORAL | Status: DC
  Administered 2020-01-16 – 2020-01-18 (×3): 1 mg via ORAL
  Filled 2020-01-16 (×3): qty 1

## 2020-01-16 MED ORDER — DM-GUAIFENESIN ER 30-600 MG PO TB12: 1.0000 | ORAL_TABLET | Freq: Two times a day (BID) | ORAL | Status: DC | PRN

## 2020-01-16 MED ORDER — HYDRALAZINE HCL 20 MG/ML IJ SOLN: 5.0000 mg | INTRAMUSCULAR | Status: DC | PRN

## 2020-01-16 MED ORDER — FLUTICASONE FUROATE-VILANTEROL 100-25 MCG/INH IN AEPB
1.0000 | INHALATION_SPRAY | Freq: Every day | RESPIRATORY_TRACT | Status: DC
  Administered 2020-01-17 – 2020-01-18 (×2): 1 via RESPIRATORY_TRACT
  Filled 2020-01-16: qty 28

## 2020-01-16 MED ORDER — LORAZEPAM 2 MG/ML IJ SOLN: 1.0000 mg | INTRAMUSCULAR | Status: DC | PRN

## 2020-01-16 MED ORDER — LORAZEPAM 1 MG PO TABS: 1.0000 mg | ORAL_TABLET | ORAL | Status: DC | PRN

## 2020-01-16 NOTE — ED Notes (Signed)
RT at patient bedside at this time.

## 2020-01-16 NOTE — ED Notes (Signed)
Patient ambulated to bathroom with 1 person assist on 8 liters of oxygen.

## 2020-01-16 NOTE — ED Notes (Signed)
Attempted to call report to Amy, RN, states she is in a pt room and would call me back

## 2020-01-16 NOTE — ED Triage Notes (Signed)
Pt arrived via [facility] from dialysis c/o SOB. Pt expressed before treatment started and did not finish treatment today. Pt was dx with pneumonia on Monday. Pt also states that he fell Friday and his right chest hurts.

## 2020-01-16 NOTE — Progress Notes (Addendum)
[facility], [place] 01/16/20  Subjective:   LOS: 0  Patient known to our practice from outpatient in previous admission He was sent over from dialysis unit today for evaluation of chest pain during dialysis patient describes it as chest tightness in his right chest.  Has right-sided chest wall tenderness Last HD was today According to outpatient unit nurse, Patient completed almost all of his treatment and was able to achieve dry weight   Objective:  Vital signs in last 24 hours:  Temp:  [98.4 F (36.9 C)] 98.4 F (36.9 C) (05/05 1040) Pulse Rate:  [78-92] 92 (05/05 1700) Resp:  [9-23] 13 (05/05 1700) BP: (112-171)/(68-94) 154/94 (05/05 1700) SpO2:  [85 %-100 %] 93 % (05/05 1700) Weight:  [65.8 kg] 65.8 kg (05/05 1041)  Weight change:  Filed Weights   01/16/20 1041  Weight: 65.8 kg    Intake/Output:   No intake or output data in the 24 hours ending 01/16/20 1728   Physical Exam: General:  No acute distress, laying in the bed  HEENT  anicteric, moist oral mucosa.  Pulm/lungs  normal breathing effort, [place] O2, mild rhonchi  CVS/Heart  no rub  Abdomen:   Soft, nontender  Extremities:  Trace edema  Neurologic:  Lethargic but able to answer questions  Skin:  Warm  Access:        Basic Metabolic Panel:  Recent Labs  Lab 01/11/20 0625 01/16/20 1012  NA  --  134*  K 5.2* 3.0*  CL  --  96*  CO2  --  25  GLUCOSE  --  92  BUN  --  18  CREATININE  --  3.68*  CALCIUM  --  8.5*     CBC: Recent Labs  Lab 01/16/20 1012  WBC 15.7*  NEUTROABS 11.4*  HGB 9.4*  HCT 27.8*  MCV 107.8*  PLT 449*      Lab Results  Component Value Date   HEPBSAG Negative 06/04/2019      Microbiology:  Recent Results (from the past 240 hour(s))  Respiratory Panel by RT PCR (Flu A&B, Covid) - Nasopharyngeal Swab     Status: None   Collection Time: 01/16/20 12:39 PM   Specimen: Nasopharyngeal Swab  Result Value Ref Range Status   SARS  Coronavirus 2 by RT PCR NEGATIVE NEGATIVE Final    Comment: (NOTE) SARS-CoV-2 target nucleic acids are NOT DETECTED. The SARS-CoV-2 RNA is generally detectable in upper respiratoy specimens during the acute phase of infection. The lowest concentration of SARS-CoV-2 viral copies this assay can detect is 131 copies/mL. A negative result does not preclude SARS-Cov-2 infection and should not be used as the sole basis for treatment or other patient management decisions. A negative result may occur with  improper specimen collection/handling, submission of specimen other than nasopharyngeal swab, presence of viral mutation(s) within the areas targeted by this assay, and inadequate number of viral copies (<131 copies/mL). A negative result must be combined with clinical observations, patient history, and epidemiological information. The expected result is Negative. Fact Sheet for Patients:  PinkCheek.be Fact Sheet for Healthcare Providers:  GravelBags.it This test is not yet ap proved or cleared by the [place] FDA and  has been authorized for detection and/or diagnosis of SARS-CoV-2 by FDA under an Emergency Use Authorization (EUA). This EUA will remain  in effect (meaning this test can be used) for the duration of the COVID-19 declaration under Section 564(b)(1) of the Act, 21 U.S.C. section 360bbb-3(b)(1), unless the  authorization is terminated or revoked sooner.    Influenza A by PCR NEGATIVE NEGATIVE Final   Influenza B by PCR NEGATIVE NEGATIVE Final    Comment: (NOTE) The Xpert Xpress SARS-CoV-2/FLU/RSV assay is intended as an aid in  the diagnosis of influenza from Nasopharyngeal swab specimens and  should not be used as a sole basis for treatment. Nasal washings and  aspirates are unacceptable for Xpert Xpress SARS-CoV-2/FLU/RSV  testing. Fact Sheet for Patients: PinkCheek.be Fact Sheet  for Healthcare Providers: GravelBags.it This test is not yet approved or cleared by the [place] FDA and  has been authorized for detection and/or diagnosis of SARS-CoV-2 by  FDA under an Emergency Use Authorization (EUA). This EUA will remain  in effect (meaning this test can be used) for the duration of the  Covid-19 declaration under Section 564(b)(1) of the Act, 21  U.S.C. section 360bbb-3(b)(1), unless the authorization is  terminated or revoked. Performed at [facility], [address] [place], [place] [address]     Coagulation Studies: No results for input(s): LABPROT, INR in the last 72 hours.  Urinalysis: No results for input(s): COLORURINE, LABSPEC, PHURINE, GLUCOSEU, HGBUR, BILIRUBINUR, KETONESUR, PROTEINUR, UROBILINOGEN, NITRITE, LEUKOCYTESUR in the last 72 hours.  Invalid input(s): APPERANCEUR    Imaging: DG Chest 2 View  Result Date: 01/16/2020 CLINICAL DATA:  Shortness of breath EXAM: CHEST - 2 VIEW COMPARISON:  December 18, 2019 FINDINGS: Central catheter tip is at the cavoatrial junction. No pneumothorax. There are scattered areas of interstitial thickening with scarring in the left base, stable. No new opacity evident. Heart size and pulmonary vascularity are normal. No adenopathy. Stent noted in left subclavian region. Status post coronary artery bypass grafting. There is aortic atherosclerosis. No adenopathy. No bone lesions. IMPRESSION: Central catheter tip at cavoatrial junction. No pneumothorax. Areas of interstitial thickening bilaterally are stable as is scarring in the left base. No edema or airspace opacity. Stable cardiac silhouette. Postoperative changes noted. Aortic Atherosclerosis (ICD10-I70.0). Electronically Signed   By: Lowella Grip III M.D.   On: 01/16/2020 11:06   CT HEAD WO CONTRAST  Result Date: 01/16/2020 CLINICAL DATA:  Headache. Head trauma. EXAM: CT HEAD WITHOUT CONTRAST TECHNIQUE: Contiguous axial  images were obtained from the base of the skull through the vertex without intravenous contrast. COMPARISON:  12/18/2019 FINDINGS: Brain: No evidence of acute infarction, hemorrhage, hydrocephalus, extra-axial collection or mass lesion/mass effect. Atrophy and chronic microvascular ischemic changes are again noted. There is an old left thalamic lacunar infarct Vascular: No hyperdense vessel or unexpected calcification. Skull: Normal. Negative for fracture or focal lesion. Sinuses/Orbits: There is new opacification of the right maxillary sinus. There is mild opacification of the right frontal sinus. The remaining paranasal sinuses and mastoid air cells are essentially clear. Other: None. IMPRESSION: 1. No acute intracranial abnormality. 2. Atrophy and chronic microvascular ischemic changes. Electronically Signed   By: Constance Holster M.D.   On: 01/16/2020 15:09   NM PULMONARY VENT AND PERF (V/Q Scan)  Result Date: 01/16/2020 CLINICAL DATA:  Right chest pain during dialysis today, shortness of breath EXAM: NUCLEAR MEDICINE VENTILATION - PERFUSION LUNG SCAN TECHNIQUE: Ventilation images were obtained in multiple projections using inhaled aerosol Tc-34m DTPA. Perfusion images were obtained in multiple projections after intravenous injection of Tc-75m MAA. RADIOPHARMACEUTICALS:  32.82 mCi of Tc-42m DTPA aerosol inhalation and 5.32 mCi Tc21m MAA IV COMPARISON:  01/16/2020, 09/24/2019 FINDINGS: Ventilation: Deposition of radiotracer within the central airways. No segmental ventilation defects. Ingested radiotracer within the esophagus and  proximal gastrointestinal tract. Perfusion: There are no significant perfusion defects. No segmental or subsegmental perfusion defects are identified. IMPRESSION: 1. Low probability study for pulmonary embolus. Electronically Signed   By: Randa Ngo M.D.   On: 01/16/2020 15:09     Medications:    . [START ON 01/17/2020] azithromycin  250 mg Oral Daily  .  ipratropium-albuterol  3 mL Nebulization Q4H  . [START ON 01/17/2020] methylPREDNISolone (SOLU-MEDROL) injection  40 mg Intravenous Q12H  . nicotine  21 mg Transdermal Daily   acetaminophen, albuterol, dextromethorphan-guaiFENesin, hydrALAZINE, ondansetron (ZOFRAN) IV  Assessment/ Plan:  59 y.o. male with end-stage renal disease  COPD GERD Tobacco abuse Rheumatoid arthritis Atrial fibrillation with anticoagulation-Eliquis History of GI bleed History of CABG Diastolic congestive heart failure  was admitted on 01/16/2020 for  Principal Problem:   COPD exacerbation ([place]) Active Problems:   Tobacco abuse   Hyperlipidemia   Chronic diastolic heart failure (HCC)   ESRD (end stage renal disease) on dialysis [facility])   Atrial fibrillation, chronic ([place])   Fall   CAD (coronary artery disease)   Anemia in ESRD (end-stage renal disease) (HCC)   HTN (hypertension)   Alcohol abuse   Acute on chronic respiratory failure with hypoxia (HCC)  COPD exacerbation ([place]) [J44.1]  #. ESRD Patient completed his HD treatment earlier today Electrolytes and Volume status are acceptable No acute indication for Dialysis at present we will schedule treatment on MWF schedule  #. Anemia of CKD  Lab Results  Component Value Date   HGB 9.4 (L) 01/16/2020   Low dose EPO with HD  #. SHPTH  No results found for: PTH Lab Results  Component Value Date   PHOS 4.0 05/31/2019   Monitor calcium and phos level during this admission  #. HTN  Monitor during hospitalization  #.  Chest pain and shortness of breath, acute respiratory failure Hypoxia due to COPD exacerbation Treatment as per hospitalist team with bronchodilators and steroids   LOS: 0 Ramie Palladino 5/5/20215:28 PM  [place], [place]

## 2020-01-16 NOTE — ED Provider Notes (Signed)
[facility] Emergency Department Provider Note       Time seen: ----------------------------------------- 10:46 AM on 01/16/2020 -----------------------------------------   I have reviewed the triage vital signs and the nursing notes.  HISTORY   Chief Complaint Shortness of Breath    HPI Daryl Weeks is a 59 y.o. male with a history of heart failure, COPD, end-stage renal disease on Monday, Wednesday and Friday dialysis, alcohol abuse, hyperlipidemia, hypertension, GI bleeding, rheumatoid arthritis who presents to the ED for shortness of breath.  Patient expressed before treatment started the he was short of breath and did not finish his treatment today.  Was diagnosed with pneumonia on Monday.  Right side of his chest has been hurting.  Discomfort is 9 out of 10 in the right ribs.  Past Medical History:  Diagnosis Date  . (HFpEF) heart failure with preserved ejection fraction ([place])    a. 05/2019 Echo: EF 50-55%, diast dysfxn, RVSP 56.59mmHg, Sev dil LA. Mildly dil PA.  [name] Anemia   . Anxiety   . Arthritis   . Colon polyps   . COPD (chronic obstructive pulmonary disease) ([place])   . Coronary artery disease    a. 10/2013 NSTEMI/Cath: Severe 3VD-->CABG x 4 @ [facility] 01/2014 (LIMA->LAD, VG->OM1->OM2, VG->RCA); b. 11/2014 MV: No isch/infarct; c. 05/2019 MV: EF 40% (50-55 by echo), small,mild, fixed apical defect - ? atten vs infarct. No ischemia->low risk.  . ESRD (end stage renal disease) ([place])    a. MWFSat HD  . ETOH abuse    a. 2 beers/night.  [name] GERD (gastroesophageal reflux disease)   . History of pneumonia   . Hyperlipidemia   . Hypertension   . IgA nephropathy   . Lower GI bleed    a. Due to colon polyps. Status post resection of 14 polyps  . Non-ST elevation MI (NSTEMI) ([place]) 2016  . PAF (paroxysmal atrial fibrillation) ([place])    a. Dx 05/2019. CHA2DS2VASc = 3-->Eliquis/[place].  . Pneumonia 08/2019  . Rheumatoid arthritis ([place])   . Shortness of breath    WITH EXERTION  . Sleep apnea   . Tobacco abuse     Patient Active Problem List   Diagnosis Date Noted  . Olecranon bursitis, right elbow 01/03/2020  . Redness of skin 01/03/2020  . Orthostasis 12/19/2019  . Protein-calorie malnutrition, severe 12/19/2019  . Hypotension 12/19/2019  . PAF (paroxysmal atrial fibrillation) ([place])   . Sleep apnea   . Gouty tophi of joint 12/06/2019  . Cough with hemoptysis 09/24/2019  . Atrial fibrillation, chronic ([place]) 09/24/2019  . Community acquired pneumonia 09/24/2019  . Hemoptysis 09/24/2019  . Complication of AV dialysis fistula 07/13/2019  . Left lower lobe pneumonia 05/28/2019  . Complication of vascular access for dialysis 11/28/2018  . COPD (chronic obstructive pulmonary disease) ([place]) 12/05/2017  . ESRD (end stage renal disease) on dialysis ([place]) 10/05/2016  . Chronic coronary artery disease 11/05/2014  . IgA nephropathy 11/05/2014  . S/P CABG x 4 11/29/2013  . Chronic diastolic heart failure ([place]) 11/16/2013  . Hyperlipidemia   . Hypertensive disorder   . Dyspnea 11/20/2012  . Tobacco abuse     Past Surgical History:  Procedure Laterality Date  . A/V FISTULAGRAM Left 10/28/2016   Procedure: A/V Fistulagram;  Surgeon: [name], MD;  Location: [place] CV LAB;  Service: Cardiovascular;  Laterality: Left;  . A/V FISTULAGRAM Left 12/07/2018   Procedure: A/V FISTULAGRAM;  Surgeon: [name], MD;  Location: [place] CV LAB;  Service:  Cardiovascular;  Laterality: Left;  . A/V FISTULAGRAM N/A 06/04/2019   Procedure: A/V Fistulagram- LEFT;  Surgeon: [name], MD;  Location: [place] CV LAB;  Service: Cardiovascular;  Laterality: N/A;  . A/V SHUNT INTERVENTION N/A 10/28/2016   Procedure: A/V Shunt Intervention;  Surgeon: [name], MD;  Location: [place] CV LAB;  Service: Cardiovascular;  Laterality: N/A;  . A/V SHUNTOGRAM Left 02/01/2019   Procedure: A/V SHUNTOGRAM;  Surgeon: [name], MD;  Location: [place] CV LAB;  Service: Cardiovascular;  Laterality: Left;  . AV FISTULA PLACEMENT    . AV FISTULA PLACEMENT Left 11/30/2019   Procedure: ARTERIOVENOUS (AV) FISTULA CREATION ( BRACHIAL CEPHALIC );  Surgeon: Katha Cabal, MD;  Location: [facility] ORS;  Service: Vascular;  Laterality: Left;  . CARDIAC CATHETERIZATION     RCA 90% and calcified mid LAD 80% Stenosis  . CORONARY ARTERY BYPASS GRAFT N/A 11/29/2013   Procedure: CORONARY ARTERY BYPASS GRAFTING (CABG) x 4 using endoscopically harvested right saphenous vein and left internal mammary artery;  Surgeon: [name], MD;  Location: [place] OR;  Service: Open Heart Surgery;  Laterality: N/A;  . dialysis catheterr  2/15  . DIALYSIS/PERMA CATHETER INSERTION N/A 07/16/2019   Procedure: DIALYSIS/PERMA CATHETER INSERTION WITH VAC CHANGE UNDER SEDATION;  Surgeon: [name], MD;  Location: [place] CV LAB;  Service: Cardiovascular;  Laterality: N/A;  . DIALYSIS/PERMA CATHETER INSERTION N/A 11/08/2019   Procedure: DIALYSIS/PERMA CATHETER INSERTION;  Surgeon: [name], MD;  Location: [place] CV LAB;  Service: Cardiovascular;  Laterality: N/A;  . DIALYSIS/PERMA CATHETER REMOVAL N/A 02/15/2019   Procedure: DIALYSIS/PERMA CATHETER REMOVAL;  Surgeon: [name], MD;  Location: [place] CV LAB;  Service: Cardiovascular;  Laterality: N/A;  . INSERTION OF DIALYSIS CATHETER N/A 12/14/2018   Procedure: INSERTION OF DIALYSIS CATHETER ( [place] );  Surgeon: [name], MD;  Location: [facility] OR[facility];  Service: Vascular;  Laterality: N/A;  . INTRAOPERATIVE TRANSESOPHAGEAL ECHOCARDIOGRAM N/A 11/29/2013   Procedure: INTRAOPERATIVE TRANSESOPHAGEAL ECHOCARDIOGRAM;  Surgeon: [name], MD;  Location: [place] OR;  Service: Open Heart Surgery;  Laterality: N/A;  . LIGATION OF ARTERIOVENOUS  FISTULA Left 07/13/2019   Procedure: LIGATION OF ARTERIOVENOUS  FISTULA;  Surgeon: [name], MD;  Location: [facility] OR[facility];  Service: Vascular;  Laterality: Left;  .  PERIPHERAL VASCULAR CATHETERIZATION N/A 06/12/2015   Procedure: A/V Shuntogram/Fistulagram;  Surgeon: [name], MD;  Location: [place] CV LAB;  Service: Cardiovascular;  Laterality: N/A;  . PERIPHERAL VASCULAR CATHETERIZATION Left 06/12/2015   Procedure: A/V Shunt Intervention;  Surgeon: [name], MD;  Location: [place] CV LAB;  Service: Cardiovascular;  Laterality: Left;  . RENAL BIOPSY Left 14  . REVISON OF ARTERIOVENOUS FISTULA Left 12/14/2018   Procedure: REVISON OF ARTERIOVENOUS FISTULA;  Surgeon: [name], MD;  Location: [facility] OR[facility];  Service: Vascular;  Laterality: Left;  . UPPER EXTREMITY VENOGRAPHY Bilateral 09/18/2019   Procedure: UPPER EXTREMITY VENOGRAPHY;  Surgeon: Katha Cabal, MD;  Location: [place] CV LAB;  Service: Cardiovascular;  Laterality: Bilateral;    Allergies Lisinopril  Social History Social History   Tobacco Use  . Smoking status: Current Every Day Smoker    Packs/day: 0.50    Years: 32.00    Pack years: 16.00    Types: Cigarettes  . Smokeless tobacco: Never Used  . Tobacco comment: daily  Substance Use Topics  . Alcohol use: Not Currently    Alcohol/week: 7.0  standard drinks    Types: 7 Cans of beer per week    Comment: 3 BEERS PER WEEK  . Drug use: No    Review of Systems Constitutional: Negative for fever. Cardiovascular: Positive for chest pain Respiratory: Positive for shortness of breath Gastrointestinal: Negative for abdominal pain, vomiting and diarrhea. Musculoskeletal: Negative for back pain. Skin: Negative for rash. Neurological: Negative for headaches, focal weakness or numbness.  All systems negative/normal/unremarkable except as stated in the HPI  ____________________________________________   PHYSICAL EXAM:  VITAL SIGNS: ED Triage Vitals  Enc Vitals Group     BP 01/16/20 1040 112/69     Pulse Rate 01/16/20 1040 85     Resp 01/16/20 1040 20     Temp 01/16/20 1040 98.4 F (36.9 C)     Temp Source  01/16/20 1040 Oral     SpO2 01/16/20 1040 93 %     Weight 01/16/20 1041 145 lb (65.8 kg)     Height 01/16/20 1041 5\' 7"  (1.702 m)     Head Circumference --      Peak Flow --      Pain Score 01/16/20 1040 9     Pain Loc --      Pain Edu? --      Excl. in [place]? --     Constitutional: Alert and oriented.  Chronically ill-appearing, mild distress Eyes: Conjunctivae are normal. Normal extraocular movements. ENT      Head: Normocephalic and atraumatic.      Nose: No congestion/rhinnorhea.      Mouth/Throat: Mucous membranes are moist.      Neck: No stridor. Cardiovascular: Normal rate, regular rhythm. No murmurs, rubs, or gallops. Respiratory: Mild tachypnea with rales and rhonchi bilaterally Gastrointestinal: Soft and nontender. Normal bowel sounds Musculoskeletal: Nontender with normal range of motion in extremities. No lower extremity tenderness nor edema. Neurologic: No gross focal neurologic deficits are appreciated.  Generalized weakness, nothing focal Skin:  Skin is warm, dry and intact. No rash noted. Psychiatric: Mood and affect are normal. Speech and behavior are normal.  ____________________________________________  EKG: Interpreted by me.  Sinus rhythm with rate of 85 bpm, prolonged PR interval, LVH with repolarization abnormality, normal QT  ____________________________________________  ED COURSE:  As part of my medical decision making, I reviewed the following data within the [place] History obtained from family if available, nursing notes, old chart and ekg, as well as notes from prior ED visits. Patient presented for shortness of breath, we will assess with labs and imaging as indicated at this time.   Procedures  TAREN TOOPS was evaluated in Emergency Department on 01/16/2020 for the symptoms described in the history of present illness. He was evaluated in the context of the global COVID-19 pandemic, which necessitated consideration that the patient  might be at risk for infection with the SARS-CoV-2 virus that causes COVID-19. Institutional protocols and algorithms that pertain to the evaluation of patients at risk for COVID-19 are in a state of rapid change based on information released by regulatory bodies including the CDC and federal and state organizations. These policies and algorithms were followed during the patient's care in the ED.  ____________________________________________   LABS (pertinent positives/negatives)  Labs Reviewed  CBC WITH DIFFERENTIAL/PLATELET - Abnormal; Notable for the following components:      Result Value   WBC 15.7 (*)    RBC 2.58 (*)    Hemoglobin 9.4 (*)    HCT 27.8 (*)    MCV  107.8 (*)    MCH 36.4 (*)    Platelets 449 (*)    Neutro Abs 11.4 (*)    Monocytes Absolute 2.2 (*)    Abs Immature Granulocytes 0.16 (*)    All other components within normal limits  BASIC METABOLIC PANEL  BLOOD GAS, VENOUS  BRAIN NATRIURETIC PEPTIDE  TROPONIN I (HIGH SENSITIVITY)   CRITICAL CARE Performed by: Laurence Aly   Total critical care time: 30 minutes  Critical care time was exclusive of separately billable procedures and treating other patients.  Critical care was necessary to treat or prevent imminent or life-threatening deterioration.  Critical care was time spent personally by me on the following activities: development of treatment plan with patient and/or surrogate as well as nursing, discussions with consultants, evaluation of patient's response to treatment, examination of patient, obtaining history from patient or surrogate, ordering and performing treatments and interventions, ordering and review of laboratory studies, ordering and review of radiographic studies, pulse oximetry and re-evaluation of patient's condition.  RADIOLOGY Images were viewed by me  Chest x-ray IMPRESSION: Central catheter tip at cavoatrial junction. No pneumothorax. Areas of interstitial thickening bilaterally  are stable as is scarring in the left base. No edema or airspace opacity. Stable cardiac silhouette. Postoperative changes noted.  Aortic Atherosclerosis (ICD10-I70.0).  ____________________________________________   DIFFERENTIAL DIAGNOSIS   Heart failure, COPD, pneumonia, sepsis, respiratory failure  FINAL ASSESSMENT AND PLAN  Dyspnea, end-stage renal disease on dialysis, hypoxia   Plan: The patient had presented for shortness of breath after dialysis.  After speaking to nephrology it was confirmed that he did actually complete his treatment today and return to his dry weight.  We did not see any obvious pneumonia on his x-ray.  He received duo nebs and steroids.. Patient's labs did reveal some leukocytosis but otherwise chronic findings.  I will discuss with the hospitalist for admission.   Laurence Aly, MD    Note: This note was generated in part or whole with voice recognition software. Voice recognition is usually quite accurate but there are transcription errors that can and very often do occur. I apologize for any typographical errors that were not detected and corrected.     Earleen Newport, MD 01/16/20 1230

## 2020-01-16 NOTE — H&P (Signed)
History and Physical    Daryl Weeks JQB:[id number] DOB: 1960/12/18 DOA: 01/16/2020  Referring MD/NP/PA:   PCP: Verl Bangs, FNP   Patient coming from:  The patient is coming from home.  At baseline, pt is independent for most of ADL.        Chief Complaint: Shortness of breath  HPI: Daryl Weeks is a 59 y.o. male with medical history significant of hypertension, hyperlipidemia, COPD, GERD, tobacco abuse, rheumatoid arthritis, atrial fibrillation on Eliquis, GI bleeding, ESRD-HD (MWF), CAD, CABG, anemia, dCHF, who presents with shortness breath.  Patient states that he went to the dialysis center for dialysis, but could not finish dialysis due to shortness of breath.  Patient has dry cough, no fever or chills.  The shortness breath has been progressively worsening.  He has oxygen desaturation to 85% on room air, needs 4 L nasal cannula oxygen in ED. No nausea, vomiting, diarrhea, abdominal pain, symptoms of UTI or unilateral weakness.  He states that he fell on Friday, possibly injured his right side of chest, causing some right-sided rib cage pain.  No loss of consciousness.  No facial droop or slurred speech. Pt was diagnosed with pneumonia on Monday and started on Levaquin.  ED Course: pt was found to have troponin 42 (troponin is at his baseline, recent baseline troponin 37-52), BNP 561, negative COVID-19 PCR, potassium 3.0, bicarbonate 25, creatinine 3.68, BUN 18, temperature normal, blood pressure 126/82, heart rate 78, RR 28, chest x-ray is negative for infiltration.  CT of the head is negative for acute intracranial abnormalities.  VQ scan with low probability of PE.  VBG with pH 7.44, CO2 42, O2 47. Patient is admitted to progressive bed as inpatient.  Review of Systems:   General: no fevers, chills, no body weight gain, has fatigue HEENT: no blurry vision, hearing changes or sore throat Respiratory: has dyspnea, coughing, wheezing CV: has chest pain, no palpitations GI: no  nausea, vomiting, abdominal pain, diarrhea, constipation GU: no dysuria, burning on urination, increased urinary frequency, hematuria  Ext: no leg edema Neuro: no unilateral weakness, numbness, or tingling, no vision change or hearing loss Skin: no rash, no skin tear. MSK: No muscle spasm, no deformity, no limitation of range of movement in spin Heme: No easy bruising.  Travel history: No recent long distant travel.  Allergy:  Allergies  Allergen Reactions  . Lisinopril Hives    Past Medical History:  Diagnosis Date  . (HFpEF) heart failure with preserved ejection fraction ([place])    a. 05/2019 Echo: EF 50-55%, diast dysfxn, RVSP 56.36mmHg, Sev dil LA. Mildly dil PA.  [name] Anemia   . Anxiety   . Arthritis   . Colon polyps   . COPD (chronic obstructive pulmonary disease) ([place])   . Coronary artery disease    a. 10/2013 NSTEMI/Cath: Severe 3VD-->CABG x 4 @ [facility] 01/2014 (LIMA->LAD, VG->OM1->OM2, VG->RCA); b. 11/2014 MV: No isch/infarct; c. 05/2019 MV: EF 40% (50-55 by echo), small,mild, fixed apical defect - ? atten vs infarct. No ischemia->low risk.  . ESRD (end stage renal disease) ([place])    a. MWFSat HD  . ETOH abuse    a. 2 beers/night.  [name] GERD (gastroesophageal reflux disease)   . History of pneumonia   . Hyperlipidemia   . Hypertension   . IgA nephropathy   . Lower GI bleed    a. Due to colon polyps. Status post resection of 14 polyps  . Non-ST elevation MI (NSTEMI) ([place]) 2016  . PAF (  paroxysmal atrial fibrillation) ([place])    a. Dx 05/2019. CHA2DS2VASc = 3-->Eliquis/Amio.  . Pneumonia 08/2019  . Rheumatoid arthritis ([place])   . Shortness of breath    WITH EXERTION  . Sleep apnea   . Tobacco abuse     Past Surgical History:  Procedure Laterality Date  . A/V FISTULAGRAM Left 10/28/2016   Procedure: A/V Fistulagram;  Surgeon: [name], MD;  Location: [place] [facility];  Service: Cardiovascular;  Laterality: Left;  . A/V FISTULAGRAM Left 12/07/2018   Procedure: A/V FISTULAGRAM;   Surgeon: [name], MD;  Location: [facility] [facility];  Service: Cardiovascular;  Laterality: Left;  . A/V FISTULAGRAM N/A 06/04/2019   Procedure: A/V Fistulagram- LEFT;  Surgeon: [name], MD;  Location: [place] [facility];  Service: Cardiovascular;  Laterality: N/A;  . A/V SHUNT INTERVENTION N/A 10/28/2016   Procedure: A/V Shunt Intervention;  Surgeon: [name], MD;  Location: [place] [facility];  Service: Cardiovascular;  Laterality: N/A;  . A/V SHUNTOGRAM Left 02/01/2019   Procedure: A/V SHUNTOGRAM;  Surgeon: [name], MD;  Location: [place] [facility];  Service: Cardiovascular;  Laterality: Left;  . AV FISTULA PLACEMENT    . AV FISTULA PLACEMENT Left 11/30/2019   Procedure: ARTERIOVENOUS (AV) FISTULA CREATION ( BRACHIAL CEPHALIC );  Surgeon: Katha Cabal, MD;  Location: [facility];  Service: Vascular;  Laterality: Left;  . CARDIAC CATHETERIZATION     RCA 90% and calcified mid LAD 80% Stenosis  . CORONARY ARTERY BYPASS GRAFT N/A 11/29/2013   Procedure: CORONARY ARTERY BYPASS GRAFTING (CABG) x 4 using endoscopically harvested right saphenous vein and left internal mammary artery;  Surgeon: [name], MD;  Location: [facility];  Service: Open Heart Surgery;  Laterality: N/A;  . dialysis catheterr  2/15  . DIALYSIS/PERMA CATHETER INSERTION N/A 07/16/2019   Procedure: DIALYSIS/PERMA CATHETER INSERTION WITH VAC CHANGE UNDER SEDATION;  Surgeon: [name], MD;  Location: [place] [facility];  Service: Cardiovascular;  Laterality: N/A;  . DIALYSIS/PERMA CATHETER INSERTION N/A 11/08/2019   Procedure: DIALYSIS/PERMA CATHETER INSERTION;  Surgeon: [name], MD;  Location: [facility] [facility];  Service: Cardiovascular;  Laterality: N/A;  . DIALYSIS/PERMA CATHETER REMOVAL N/A 02/15/2019   Procedure: DIALYSIS/PERMA CATHETER REMOVAL;  Surgeon: [name], MD;  Location: [facility];  Service: Cardiovascular;  Laterality: N/A;  . INSERTION OF DIALYSIS CATHETER N/A  12/14/2018   Procedure: INSERTION OF DIALYSIS CATHETER ( [place] );  Surgeon: [name], MD;  Location: [facility];  Service: Vascular;  Laterality: N/A;  . INTRAOPERATIVE TRANSESOPHAGEAL ECHOCARDIOGRAM N/A 11/29/2013   Procedure: INTRAOPERATIVE TRANSESOPHAGEAL ECHOCARDIOGRAM;  Surgeon: [name], MD;  Location: [facility];  Service: Open Heart Surgery;  Laterality: N/A;  . LIGATION OF ARTERIOVENOUS  FISTULA Left 07/13/2019   Procedure: LIGATION OF ARTERIOVENOUS  FISTULA;  Surgeon: [name], MD;  Location: [facility];  Service: Vascular;  Laterality: Left;  . PERIPHERAL VASCULAR CATHETERIZATION N/A 06/12/2015   Procedure: A/V Shuntogram/Fistulagram;  Surgeon: [name], MD;  Location: [facility];  Service: Cardiovascular;  Laterality: N/A;  . PERIPHERAL VASCULAR CATHETERIZATION Left 06/12/2015   Procedure: A/V Shunt Intervention;  Surgeon: [name], MD;  Location: [facility];  Service: Cardiovascular;  Laterality: Left;  . RENAL BIOPSY Left 14  . REVISON OF ARTERIOVENOUS FISTULA Left 12/14/2018   Procedure: REVISON OF ARTERIOVENOUS FISTULA;  Surgeon: [name], MD;  [facility];  Service: Vascular;  Laterality: Left;  . UPPER EXTREMITY VENOGRAPHY Bilateral 09/18/2019   Procedure: UPPER EXTREMITY VENOGRAPHY;  Surgeon: Katha Cabal, MD;  Location: [place] [facility];  Service: Cardiovascular;  Laterality: Bilateral;    Social History:  reports that he has been smoking cigarettes. He has a 16.00 pack-year smoking history. He has never used smokeless tobacco. He reports previous alcohol use of about 7.0 standard drinks of alcohol per week. He reports that he does not use drugs.  Family History:  Family History  Problem Relation Age of Onset  . Heart disease Father   . Heart disease Brother   . Healthy Sister   . Stroke Neg Hx      Prior to Admission medications   Medication Sig Start Date End Date Taking? Authorizing Provider  amLODipine (NORVASC) 10 MG  tablet Take 10 mg by mouth at bedtime. 11/24/19   [provider]  apixaban (ELIQUIS) 2.5 MG TABS tablet Take 2.5 mg by mouth daily.     [provider]  aspirin EC 81 MG tablet Take 1 tablet (81 mg total) by mouth daily. 11/06/19   Dunn, Areta Haber, PA-C  atorvastatin (LIPITOR) 20 MG tablet take 1 tablet by mouth at bedtime Patient taking differently: Take 20 mg by mouth every morning.  04/25/14   Minna Merritts, MD  AURYXIA 1 GM 210 MG(Fe) tablet Take 420 mg by mouth 3 (three) times daily with meals.     [provider]  calcium carbonate (TUMS - DOSED IN MG ELEMENTAL CALCIUM) 500 MG chewable tablet Chew 1 tablet by mouth daily as needed for indigestion or heartburn.     [provider]  carvedilol (COREG) 12.5 MG tablet Take 2 tablets (25 mg total) by mouth 2 (two) times daily with a meal. 01/01/20   Dunn, Areta Haber, PA-C  cinacalcet (SENSIPAR) 30 MG tablet Take 30 mg by mouth daily with supper.     [provider]  docusate sodium (COLACE) 100 MG capsule Take 1 capsule (100 mg total) by mouth 2 (two) times daily as needed for mild constipation. 06/05/19   Gouru, Illene Silver, MD  Fluticasone Propionate, Inhal, (FLOVENT DISKUS) 100 MCG/BLIST AEPB Inhale 1 puff into the lungs 2 (two) times daily. Patient taking differently: Inhale 1 puff into the lungs every morning.  10/17/19   Tyler Pita, MD  Fluticasone-Umeclidin-Vilant (TRELEGY ELLIPTA) 100-62.5-25 MCG/INH AEPB Inhale 1 puff into the lungs daily. Patient taking differently: Inhale 1 puff into the lungs every other day.  10/16/19   Tyler Pita, MD  furosemide (LASIX) 80 MG tablet Take 80 mg by mouth 2 (two) times daily.     [provider]  gabapentin (NEURONTIN) 100 MG capsule Take 100 mg by mouth 3 (three) times daily.  01/14/18   [provider]  HYDROcodone-acetaminophen (NORCO) 5-325 MG tablet Take 1-2 tablets by mouth every 6 (six) hours as needed for moderate pain or severe pain.  11/30/19   Schnier, Dolores Lory, MD  loratadine (CLARITIN) 10 MG tablet Take 1 tablet (10 mg total) by mouth daily. Patient taking differently: Take 10 mg by mouth every morning.  06/06/19   Gouru, Illene Silver, MD  multivitamin (RENA-VIT) TABS tablet Take 1 tablet by mouth at bedtime. 12/20/19   Fritzi Mandes, MD  ondansetron (ZOFRAN-ODT) 4 MG disintegrating tablet Take 4 mg by mouth every 8 (eight) hours as needed for nausea.     [provider]  sevelamer carbonate (RENVELA) 800 MG  tablet Take 800-2,400 mg by mouth See admin instructions. Take 3 tablets (2400mg ) by mouth three times daily before meals and take 1 tablet (800mg ) by mouth once daily with a snack 10/23/19   [provider]  SPIRIVA HANDIHALER 18 MCG inhalation capsule Place 18 mcg into inhaler and inhale daily.  12/04/19   [provider]  zolpidem (AMBIEN) 10 MG tablet Take 10 mg by mouth at bedtime as needed for sleep.    [provider]    Physical Exam: Vitals:   01/16/20 1530 01/16/20 1600 01/16/20 1630 01/16/20 1631  BP: (!) 171/82 (!) 143/83 (!) 149/80   Pulse: 86 85 92 92  Resp: 16 14 14 17   Temp:      TempSrc:      SpO2: 100% 96% 90% 94%  Weight:      Height:       General: Not in acute distress HEENT:       Eyes: PERRL, EOMI, no scleral icterus.       ENT: No discharge from the ears and nose, no pharynx injection, no tonsillar enlargement.        Neck: No JVD, no bruit, no mass felt. Heme: No neck lymph node enlargement. Cardiac: S1/S2, RRR, No murmurs, No gallops or rubs. Respiratory: Has wheezing bilaterally.  Chest wall: has right-sided chest wall tenderness GU: No hematuria Ext: No pitting leg edema bilaterally. 2+DP/PT pulse bilaterally. Musculoskeletal: No joint deformities, No joint redness or warmth, no limitation of ROM in spin. Skin: No rashes.  Neuro: Alert, oriented X3, cranial nerves II-XII grossly intact, moves all extremities normally.  Psych: Patient is not psychotic, no  suicidal or hemocidal ideation.  Labs on Admission: I have personally reviewed following labs and imaging studies  CBC: Recent Labs  Lab 01/16/20 1012  WBC 15.7*  NEUTROABS 11.4*  HGB 9.4*  HCT 27.8*  MCV 107.8*  PLT 185*   Basic Metabolic Panel: Recent Labs  Lab 01/11/20 0625 01/16/20 1012  NA  --  134*  K 5.2* 3.0*  CL  --  96*  CO2  --  25  GLUCOSE  --  92  BUN  --  18  CREATININE  --  3.68*  CALCIUM  --  8.5*   GFR: Estimated Creatinine Clearance: 20.9 mL/min (A) (by C-G formula based on SCr of 3.68 mg/dL (H)). Liver Function Tests: No results for input(s): AST, ALT, ALKPHOS, BILITOT, PROT, ALBUMIN in the last 168 hours. No results for input(s): LIPASE, AMYLASE in the last 168 hours. No results for input(s): AMMONIA in the last 168 hours. Coagulation Profile: No results for input(s): INR, PROTIME in the last 168 hours. Cardiac Enzymes: No results for input(s): CKTOTAL, CKMB, CKMBINDEX, TROPONINI in the last 168 hours. BNP (last 3 results) No results for input(s): PROBNP in the last 8760 hours. HbA1C: No results for input(s): HGBA1C in the last 72 hours. CBG: No results for input(s): GLUCAP in the last 168 hours. Lipid Profile: No results for input(s): CHOL, HDL, LDLCALC, TRIG, CHOLHDL, LDLDIRECT in the last 72 hours. Thyroid Function Tests: No results for input(s): TSH, T4TOTAL, FREET4, T3FREE, THYROIDAB in the last 72 hours. Anemia Panel: No results for input(s): VITAMINB12, FOLATE, FERRITIN, TIBC, IRON, RETICCTPCT in the last 72 hours. Urine analysis:    Component Value Date/Time   COLORURINE Amber 01/12/2014 [place] 11/27/2013 1309   APPEARANCEUR Cloudy 01/12/2014 0513   LABSPEC 1.028 01/12/2014 [place] 5.0 01/12/2014 [place]  7.5 11/27/2013 1309   GLUCOSEU 50 mg/dL 01/12/2014 0513   HGBUR 1+ 01/12/2014 0513   HGBUR SMALL (A) 11/27/2013 1309   BILIRUBINUR Negative 01/12/2014 0513   KETONESUR Trace 01/12/2014 0513    KETONESUR NEGATIVE 11/27/2013 1309   PROTEINUR >=500 01/12/2014 0513   PROTEINUR >300 (A) 11/27/2013 1309   UROBILINOGEN 0.2 11/27/2013 1309   NITRITE Negative 01/12/2014 0513   NITRITE NEGATIVE 11/27/2013 1309   LEUKOCYTESUR 3+ 01/12/2014 0513   Sepsis Labs: @LABRCNTIP (procalcitonin:4,lacticidven:4) ) Recent Results (from the past 240 hour(s))  Respiratory Panel by RT PCR (Flu A&B, Covid) - Nasopharyngeal Swab     Status: None   Collection Time: 01/16/20 12:39 PM   Specimen: Nasopharyngeal Swab  Result Value Ref Range Status   SARS Coronavirus 2 by RT PCR NEGATIVE NEGATIVE Final    Comment: (NOTE) SARS-CoV-2 target nucleic acids are NOT DETECTED. The SARS-CoV-2 RNA is generally detectable in upper respiratoy specimens during the acute phase of infection. The lowest concentration of SARS-CoV-2 viral copies this assay can detect is 131 copies/mL. A negative result does not preclude SARS-Cov-2 infection and should not be used as the sole basis for treatment or other patient management decisions. A negative result may occur with  improper specimen collection/handling, submission of specimen other than nasopharyngeal swab, presence of viral mutation(s) within the areas targeted by this assay, and inadequate number of viral copies (<131 copies/mL). A negative result must be combined with clinical observations, patient history, and epidemiological information. The expected result is Negative. Fact Sheet for Patients:  PinkCheek.be Fact Sheet for Healthcare Providers:  GravelBags.it This test is not yet ap proved or cleared by the [place] FDA and  has been authorized for detection and/or diagnosis of SARS-CoV-2 by FDA under an Emergency Use Authorization (EUA). This EUA will remain  in effect (meaning this test can be used) for the duration of the COVID-19 declaration under Section 564(b)(1) of the Act, 21 U.S.C. section  360bbb-3(b)(1), unless the authorization is terminated or revoked sooner.    Influenza A by PCR NEGATIVE NEGATIVE Final   Influenza B by PCR NEGATIVE NEGATIVE Final    Comment: (NOTE) The Xpert Xpress SARS-CoV-2/FLU/RSV assay is intended as an aid in  the diagnosis of influenza from Nasopharyngeal swab specimens and  should not be used as a sole basis for treatment. Nasal washings and  aspirates are unacceptable for Xpert Xpress SARS-CoV-2/FLU/RSV  testing. Fact Sheet for Patients: PinkCheek.be Fact Sheet for Healthcare Providers: GravelBags.it This test is not yet approved or cleared by the [place] FDA and  has been authorized for detection and/or diagnosis of SARS-CoV-2 by  FDA under an Emergency Use Authorization (EUA). This EUA will remain  in effect (meaning this test can be used) for the duration of the  Covid-19 declaration under Section 564(b)(1) of the Act, 21  U.S.C. section 360bbb-3(b)(1), unless the authorization is  terminated or revoked. Performed at [facility], [address], [place], [place] [address]      Radiological Exams on Admission: DG Chest 2 View  Result Date: 01/16/2020 CLINICAL DATA:  Shortness of breath EXAM: CHEST - 2 VIEW COMPARISON:  December 18, 2019 FINDINGS: Central catheter tip is at the cavoatrial junction. No pneumothorax. There are scattered areas of interstitial thickening with scarring in the left base, stable. No new opacity evident. Heart size and pulmonary vascularity are normal. No adenopathy. Stent noted in left subclavian region. Status post coronary artery bypass grafting. There is aortic atherosclerosis. No adenopathy. No  bone lesions. IMPRESSION: Central catheter tip at cavoatrial junction. No pneumothorax. Areas of interstitial thickening bilaterally are stable as is scarring in the left base. No edema or airspace opacity. Stable cardiac silhouette. Postoperative  changes noted. Aortic Atherosclerosis (ICD10-I70.0). Electronically Signed   By: Lowella Grip III M.D.   On: 01/16/2020 11:06   CT HEAD WO CONTRAST  Result Date: 01/16/2020 CLINICAL DATA:  Headache. Head trauma. EXAM: CT HEAD WITHOUT CONTRAST TECHNIQUE: Contiguous axial images were obtained from the base of the skull through the vertex without intravenous contrast. COMPARISON:  12/18/2019 FINDINGS: Brain: No evidence of acute infarction, hemorrhage, hydrocephalus, extra-axial collection or mass lesion/mass effect. Atrophy and chronic microvascular ischemic changes are again noted. There is an old left thalamic lacunar infarct Vascular: No hyperdense vessel or unexpected calcification. Skull: Normal. Negative for fracture or focal lesion. Sinuses/Orbits: There is new opacification of the right maxillary sinus. There is mild opacification of the right frontal sinus. The remaining paranasal sinuses and mastoid air cells are essentially clear. Other: None. IMPRESSION: 1. No acute intracranial abnormality. 2. Atrophy and chronic microvascular ischemic changes. Electronically Signed   By: Constance Holster M.D.   On: 01/16/2020 15:09   NM PULMONARY VENT AND PERF (V/Q Scan)  Result Date: 01/16/2020 CLINICAL DATA:  Right chest pain during dialysis today, shortness of breath EXAM: NUCLEAR MEDICINE VENTILATION - PERFUSION LUNG SCAN TECHNIQUE: Ventilation images were obtained in multiple projections using inhaled aerosol Tc-4m DTPA. Perfusion images were obtained in multiple projections after intravenous injection of Tc-59m MAA. RADIOPHARMACEUTICALS:  32.82 mCi of Tc-5m DTPA aerosol inhalation and 5.32 mCi Tc34m MAA IV COMPARISON:  01/16/2020, 09/24/2019 FINDINGS: Ventilation: Deposition of radiotracer within the central airways. No segmental ventilation defects. Ingested radiotracer within the esophagus and proximal gastrointestinal tract. Perfusion: There are no significant perfusion defects. No segmental or  subsegmental perfusion defects are identified. IMPRESSION: 1. Low probability study for pulmonary embolus. Electronically Signed   By: Randa Ngo M.D.   On: 01/16/2020 15:09     EKG: Independently reviewed.  Sinus rhythm, QTC 456,  LAE, T wave inversion in lateral leads and V4-V6 and in lead II.    Assessment/Plan Principal Problem:   COPD exacerbation (HCC) Active Problems:   Tobacco abuse   Hyperlipidemia   Chronic diastolic heart failure (HCC)   ESRD (end stage renal disease) on dialysis [facility])   Atrial fibrillation, chronic (HCC)   Fall   CAD (coronary artery disease)   Anemia in ESRD (end-stage renal disease) (HCC)   HTN (hypertension)   Alcohol abuse   Acute on chronic respiratory failure with hypoxia (HCC)   Acute on chronic respiratory failure with hypoxia due to COPD exacerbation ([place]): Patient's shortness breath is likely due to COPD exacerbation given bilateral wheezing on auscultation.  VQ scan is low probability of PE.  Chest x-ray has no infiltration.  - will admit to progressive unit as inpatient -Bronchodilators -Solu-Medrol 40 mg IV tid -Z pak -Mucinex for cough  -Incentive spirometry -Follow up sputum culture -Nasal cannula oxygen as needed to maintain O2 saturation 93% or greater  Tobacco abuse and Alcohol abuse: -Nicotine patch -CIWA protocol  Hyperlipidemia -lipitor  Chronic diastolic heart failure ([place]): 2D echo on 05/29/2019 showed EF of 50-55%.  No leg edema.  CHF seem to be compensated -volume management per renal by dialysis  ESRD (end stage renal disease) on dialysis [facility]) -Message sent to Dr. Candiss Norse for dialysis  Atrial fibrillation, chronic ([place]) -Continue Coreg and Eliquis  Fall: CT head is  negative for acute intracranial abnormalities.  No focal neuro deficit on physical examination -PT/OT  CAD (coronary artery disease): Patient has elevated troponin 42, which is at baseline.  Will not cycle troponin -Continue aspirin,  Lipitor  Anemia in ESRD (end-stage renal disease) ([place]): Hemoglobin stable.  8.8 on 12/19/2019 --> 9.4 today -Follow-up with CBC  HTN:  -Continue home medications: Amlodipine, Coreg -pt is also on Lasix 80 mg bid -hydralazine prn   DVT ppx: on Eliquis Code Status: Full code Family Communication: not done, no family member is at bed side.  Disposition Plan:  Anticipate discharge back to previous home environment Consults called:  Renal, Dr. Candiss Norse Admission status:  progressive unit as inpt       Status is: Inpatient Remains inpatient appropriate because:Inpatient level of care appropriate due to severity of illness Dispo: The patient is from: Home              Anticipated d/c is to: Home              Anticipated d/c date is: 2 days              Patient currently is not medically stable to d/c.   Inpatient status:  # Patient requires inpatient status due to high intensity of service, high risk for further deterioration and high frequency of surveillance required.  I certify that at the point of admission it is my clinical judgment that the patient will require inpatient hospital care spanning beyond 2 midnights from the point of admission.  . This patient has multiple chronic comorbidities including hypertension, hyperlipidemia, COPD, GERD, tobacco abuse, rheumatoid arthritis, atrial fibrillation on Eliquis, GI bleeding, ESRD-HD (MWF), CAD, CABG, anemia, dCHF . Now patient has presenting with COPD exacerbation . The worrisome physical exam findings include bilateral wheezing on auscultation . Current medical needs: please see my assessment and plan . Predictability of an adverse outcome (risk): Patient has multiple comorbidities as listed above. Now presents with acute on chronic respiratory failure with hypoxia due to COPD exacerbation. Pt has new oxygen requirement. Patient's presentation is highly complicated.  Patient is at high risk of deteriorating.  Will need to be treated in  hospital for at least 2 days.                    Date of Service 01/16/2020    [facility]   If 7PM-7AM, please contact night-coverage [email or web address] 01/16/2020, 4:54 PM

## 2020-01-16 NOTE — ED Notes (Signed)
RN has requested Respiratory Therapy to assess patient. RT is en-route to assess patient.

## 2020-01-17 DIAGNOSIS — J441 Chronic obstructive pulmonary disease with (acute) exacerbation: Principal | ICD-10-CM

## 2020-01-17 LAB — BASIC METABOLIC PANEL
Anion gap: 13 (ref 5–15)
BUN: 36 mg/dL — ABNORMAL HIGH (ref 6–20)
CO2: 25 mmol/L (ref 22–32)
Calcium: 9.2 mg/dL (ref 8.9–10.3)
Chloride: 96 mmol/L — ABNORMAL LOW (ref 98–111)
Creatinine, Ser: 5.78 mg/dL — ABNORMAL HIGH (ref 0.61–1.24)
GFR calc Af Amer: 12 mL/min — ABNORMAL LOW (ref >60)
GFR calc non Af Amer: 10 mL/min — ABNORMAL LOW (ref >60)
Glucose, Bld: 136 mg/dL — ABNORMAL HIGH (ref 70–99)
Potassium: 4.5 mmol/L (ref 3.5–5.1)
Sodium: 134 mmol/L — ABNORMAL LOW (ref 135–145)

## 2020-01-17 LAB — CBC
HCT: 28.6 % — ABNORMAL LOW (ref 39.0–52.0)
Hemoglobin: 10.1 g/dL — ABNORMAL LOW (ref 13.0–17.0)
MCH: 37.1 pg — ABNORMAL HIGH (ref 26.0–34.0)
MCHC: 35.3 g/dL (ref 30.0–36.0)
MCV: 105.1 fL — ABNORMAL HIGH (ref 80.0–100.0)
Platelets: 485 10*3/uL — ABNORMAL HIGH (ref 150–400)
RBC: 2.72 MIL/uL — ABNORMAL LOW (ref 4.22–5.81)
RDW: 15.2 % (ref 11.5–15.5)
WBC: 23.5 10*3/uL — ABNORMAL HIGH (ref 4.0–10.5)
nRBC: 0 % (ref 0.0–0.2)

## 2020-01-17 LAB — MAGNESIUM: Magnesium: 2.3 mg/dL (ref 1.7–2.4)

## 2020-01-17 LAB — EXPECTORATED SPUTUM ASSESSMENT W GRAM STAIN, RFLX TO RESP C

## 2020-01-17 LAB — HIV ANTIBODY (ROUTINE TESTING W REFLEX): HIV Screen 4th Generation wRfx: NONREACTIVE

## 2020-01-17 MED ORDER — PREDNISONE 20 MG PO TABS
40.0000 mg | ORAL_TABLET | Freq: Every day | ORAL | Status: DC
  Administered 2020-01-18: 40 mg via ORAL
  Filled 2020-01-17: qty 2

## 2020-01-17 MED ORDER — CHLORHEXIDINE GLUCONATE CLOTH 2 % EX PADS
6.0000 | MEDICATED_PAD | Freq: Every day | CUTANEOUS | Status: DC
  Administered 2020-01-18: 07:00:00 6 via TOPICAL

## 2020-01-17 MED ORDER — APIXABAN 5 MG PO TABS
5.0000 mg | ORAL_TABLET | Freq: Two times a day (BID) | ORAL | Status: DC
  Administered 2020-01-17 – 2020-01-18 (×2): 5 mg via ORAL
  Filled 2020-01-17 (×2): qty 1

## 2020-01-17 MED ORDER — IPRATROPIUM-ALBUTEROL 0.5-2.5 (3) MG/3ML IN SOLN
3.0000 mL | Freq: Four times a day (QID) | RESPIRATORY_TRACT | Status: DC
  Administered 2020-01-17 – 2020-01-18 (×3): 3 mL via RESPIRATORY_TRACT
  Filled 2020-01-17 (×3): qty 3

## 2020-01-17 NOTE — Evaluation (Signed)
Physical Therapy Evaluation Patient Details Name: Daryl Weeks MRN: [id number] DOB: Sep 12, 1961 Today's Date: 01/17/2020   History of Present Illness  Daryl Weeks is a 59 y.o. male with PMHx of ESRD on HD MWF,, CAD s/p CABG, PAF on Eliquis, COPD, HFpEF, HTN, HLD, CVA, anemia, gout, RA, Syncope, hrthostatic hypotension, and OSA who presents to the ED for evaluation after experiencing chest pain and shortness of breath during HD. Pt has had similar episodes in the past.  Clinical Impression  Pt admitted with above diagnosis. Pt currently with functional limitations due to the deficits listed below (see "PT Problem List"). Upon entry, pt in bed, awake and agreeable to participate. The pt is alert and oriented x4, pleasant, conversational, and generally a good historian. Pt on 4L via HFNC but is able to perform entire session on room air without desaturation below 90% with AMB, typically 96% resting. Pt has orthostasis without symptoms and without functional limitations, however of note pt does have a history of OH and syncope. Chief Strategy Officer discussed with nephrologist on unit, who reports pt is typically HTN but has PRN meds at home for hypotension. Functional mobility assessment demonstrates increased effort/time requirements, poor tolerance, but no need for physical assistance, whereas the patient performed these at a higher level of independence PTA. Pt will benefit from skilled PT intervention to increase independence and safety with basic mobility in preparation for discharge to the venue listed below.      01/17/20 1045  Orthostatic Lying   BP- Lying 121/67  Pulse- Lying 73  Orthostatic Sitting  BP- Sitting 102/70  Pulse- Sitting 81  Orthostatic Standing at 0 minutes  BP- Standing at 0 minutes 96/62  Pulse- Standing at 0 minutes 81  Orthostatic Standing at 3 minutes  BP- Standing at 3 minutes 95/69  Pulse- Standing at 3 minutes 86  Oxygen Therapy  SpO2 90 %  O2 Device Room Air  Patient Activity  (if Appropriate) Ambulating       Follow Up Recommendations Home health PT;Other (comment)(continue with HHPT services)    Equipment Recommendations  None recommended by PT    Recommendations for Other Services       Precautions / Restrictions Precautions Precautions: Fall Precaution Comments: High Fall Restrictions Weight Bearing Restrictions: No      Mobility  Bed Mobility Overal bed mobility: Modified Independent             General bed mobility comments: trunk fatigue and weakness in sitting EOB  Transfers Overall transfer level: Needs assistance Equipment used: None Transfers: Sit to/from Stand Sit to Stand: Supervision         General transfer comment: able to STS with RW at modified independent level  Ambulation/Gait   Gait Distance (Feet): 220 Feet Assistive device: Rolling walker (2 wheeled)       General Gait Details: slow but steady, no concernign symptoms for the patient. Terminal SpO2: 90% on Room air.  Stairs            Wheelchair Mobility    Modified Rankin (Stroke Patients Only)       Balance Overall balance assessment: Needs assistance Sitting-balance support: Single extremity supported;Bilateral upper extremity supported Sitting balance-Leahy Scale: Good Sitting balance - Comments: Steady static sitting, reaching within BOS.   Standing balance support: During functional activity;Bilateral upper extremity supported Standing balance-Leahy Scale: Good  Pertinent Vitals/Pain Pain Assessment: No/denies pain    Home Living Family/patient expects to be discharged to:: Private residence Living Arrangements: Alone Available Help at Discharge: Family Type of Home: Mobile home Home Access: Stairs to enter Entrance Stairs-Rails: Psychiatric nurse of Steps: 5 Home Layout: One level Home Equipment: Cane - single point;Walker - 4 wheels;Walker - 2 wheels      Prior  Function Level of Independence: Independent with assistive device(s)         Comments: Pt reports that he drives to HD appointments, goes shopping, mows his lawn, overall able to do all he needs, endorses approx 2 falls in past 6 months (lightheaded/passed out/tripped); takes bird baths (can't get HD catheter wet). Uses 2WW for functional mobility.     Hand Dominance   Dominant Hand: Right    Extremity/Trunk Assessment   Upper Extremity Assessment Upper Extremity Assessment: Overall WFL for tasks assessed    Lower Extremity Assessment Lower Extremity Assessment: Overall WFL for tasks assessed;Generalized weakness    Cervical / Trunk Assessment Cervical / Trunk Assessment: Kyphotic  Communication   Communication: No difficulties  Cognition Arousal/Alertness: Awake/alert Behavior During Therapy: WFL for tasks assessed/performed Overall Cognitive Status: Within Functional Limits for tasks assessed                                        General Comments General comments (skin integrity, edema, etc.): Pt on 6L HFNC t/o evaluation. HR/SPO2 remain WFLs (HR 88-90; spO2 >97%) t/o.    Exercises Other Exercises Other Exercises: Pt educated in falls prevention strategies, safe use of AE/DME for ADL management, Safe transfer techniques, and energy conservation strategies including PLB and activity pacing. Other Exercises: Pt engaged in functional transfer to room recliner with supervision provided t/o.   Assessment/Plan    PT Assessment Patient needs continued PT services  PT Problem List Decreased strength;Decreased range of motion;Decreased activity tolerance;Decreased balance;Decreased mobility       PT Treatment Interventions DME instruction;Balance training;Functional mobility training;Therapeutic activities;Therapeutic exercise;Gait training;Patient/family education    PT Goals (Current goals can be found in the Care Plan section)  Acute Rehab PT  Goals Patient Stated Goal: To go home to my dog PT Goal Formulation: With patient Time For Goal Achievement: 01/31/20 Potential to Achieve Goals: Good    Frequency Min 2X/week   Barriers to discharge Decreased caregiver support      Co-evaluation               AM-PAC PT "6 Clicks" Mobility  Outcome Measure Help needed turning from your back to your side while in a flat bed without using bedrails?: None Help needed moving from lying on your back to sitting on the side of a flat bed without using bedrails?: None Help needed moving to and from a bed to a chair (including a wheelchair)?: None Help needed standing up from a chair using your arms (e.g., wheelchair or bedside chair)?: None Help needed to walk in hospital room?: A Little Help needed climbing 3-5 steps with a railing? : A Little 6 Click Score: 22    End of Session Equipment Utilized During Treatment: Gait belt;Oxygen Activity Tolerance: Patient tolerated treatment well;Patient limited by fatigue Patient left: in bed;with nursing/sitter in room;with call bell/phone within reach Nurse Communication: Mobility status PT Visit Diagnosis: Unsteadiness on feet (R26.81);Other abnormalities of gait and mobility (R26.89);Muscle weakness (generalized) (M62.81)    Time:  2493-2419 PT Time Calculation (min) (ACUTE ONLY): 29 min   Charges:   PT Evaluation $PT Eval Moderate Complexity: 1 Mod PT Treatments $Therapeutic Exercise: 8-22 mins        1:06 PM, 01/17/20 Etta Grandchild, PT, DPT Physical Therapist - [facility]  [phone] ([place])   [place] [facility] 01/17/2020, 12:59 PM

## 2020-01-17 NOTE — Evaluation (Signed)
Occupational Therapy Evaluation Patient Details Name: Daryl Weeks MRN: [id number] DOB: [birth year] Today's Date: 01/17/2020    History of Present Illness 59 y.o. male with PMHx of ESRD on HD MWF and sometimes Saturday, CAD s/p CABG, PAF on Eliquis, COPD, HFpEF, HTN, HLD, CVA, anemia, gout, RA, and OSA who presents to the ED for evaluation after experiencing chest pain and shortness of breath during HD.   Clinical Impression   Daryl Weeks was seen for OT evaluation this date. Pt reports he was generally modified independent in all ADL and functional mobility, living in a 1-level mobile home with his dog, "charlie". Pt denies use of supplemental O2 in the home this date, but states he had recently begun [facility] therapy services. He reports becoming easily fatigued or out of breath with minimal exertion over the last few days. Pt currently requires MIN A for LB bathing and dressing tasks as well as supervision for safety during functional mobility with a RW due to current functional impairments (See OT Problem List below). Pt educated in energy conservation strategies including pursed lip breathing, activity pacing, home/routines modifications, work simplification, AE/DME, prioritizing of meaningful occupations, and falls prevention. Pt verbalized understanding and would benefit from additional skilled OT services to maximize recall and carryover of learned techniques and facilitate implementation of learned techniques into daily routines. Upon discharge, recommend [place] services.       Follow Up Recommendations  Home health OT    Equipment Recommendations  3 in 1 bedside commode    Recommendations for Other Services       Precautions / Restrictions Precautions Precautions: Fall Precaution Comments: High Fall Restrictions Weight Bearing Restrictions: No      Mobility Bed Mobility Overal bed mobility: Modified Independent             General bed mobility comments: Pt comes to sitting from  semi-supine to EOB with good confidence and control.  Transfers Overall transfer level: Needs assistance Equipment used: Rolling walker (2 wheeled) Transfers: Sit to/from Stand Sit to Stand: Supervision              Balance Overall balance assessment: Needs assistance Sitting-balance support: Single extremity supported;Bilateral upper extremity supported Sitting balance-Leahy Scale: Good Sitting balance - Comments: Steady static sitting, reaching within BOS.   Standing balance support: During functional activity;Bilateral upper extremity supported Standing balance-Leahy Scale: Good                             ADL either performed or assessed with clinical judgement   ADL Overall ADL's : Needs assistance/impaired                                       General ADL Comments: Pt is functionally limited by cardiopulmonary status, decreased [employer], decreased activity tolerance, and decreased safety awareness. He requires MIN A for heavy ADL management including seated LB dressing and bathing tasks. SUP for safety during functional mobility with cueing for safe use of AE this date.     Vision Baseline Vision/History: Wears glasses Wears Glasses: At all times Patient Visual Report: No change from baseline(Doesn't have them.)       Perception     Praxis      Pertinent Vitals/Pain Pain Assessment: No/denies pain     Hand Dominance Right   Extremity/Trunk Assessment Upper Extremity Assessment Upper Extremity Assessment:  Generalized weakness(Pt with significant bruising over left elbow/forearm. States this came from a recent fall and endorses decresed sensation of the L 4th and 5th digit)   Lower Extremity Assessment Lower Extremity Assessment: Generalized weakness       Communication Communication Communication: No difficulties   Cognition Arousal/Alertness: Awake/alert Behavior During Therapy: WFL for tasks assessed/performed Overall  Cognitive Status: Within Functional Limits for tasks assessed                                     General Comments  Pt on 6L HFNC t/o evaluation. HR/SPO2 remain WFLs (HR 88-90; spO2 >97%) t/o.    Exercises Other Exercises Other Exercises: Pt educated in falls prevention strategies, safe use of AE/DME for ADL management, Safe transfer techniques, and energy conservation strategies including PLB and activity pacing. Other Exercises: Pt engaged in functional transfer to room recliner with supervision provided t/o.   Shoulder Instructions      Home Living Family/patient expects to be discharged to:: Private residence Living Arrangements: Alone Available Help at Discharge: Family Type of Home: Mobile home Home Access: Stairs to enter Entrance Stairs-Number of Steps: 5 Entrance Stairs-Rails: Right;Left Home Layout: One level     Bathroom Shower/Tub: Teacher, early years/pre: Standard Bathroom Accessibility: Yes How Accessible: Accessible via walker [place] - single point;Walker - 4 wheels;Walker - 2 wheels          Prior Functioning/Environment Level of Independence: Independent with assistive device(s)        Comments: Pt reports that he drives to HD appointments, goes shopping, mows his lawn, overall able to do all he needs, endorses approx 2 falls in past 6 months (lightheaded/passed out/tripped); takes bird baths (can't get HD catheter wet). Uses 2WW for functional mobility.        OT Problem List: Decreased strength;Decreased coordination;Decreased activity tolerance;Decreased safety awareness;Impaired balance (sitting and/or standing);Decreased knowledge of use of DME or AE;Cardiopulmonary status limiting activity;Impaired sensation;Impaired UE functional use      OT Treatment/Interventions: Self-care/ADL training;Therapeutic exercise;Therapeutic activities;DME and/or AE instruction;Balance training;Energy conservation;Patient/family  education    OT Goals(Current goals can be found in the care plan section) Acute Rehab OT Goals Patient Stated Goal: To go home to my dog OT Goal Formulation: With patient Time For Goal Achievement: 01/31/20 Potential to Achieve Goals: Good ADL Goals Pt Will Perform Grooming: with modified independence;sitting(LRAD PRN for safety and functional independence.) Pt Will Perform Lower Body Dressing: sit to/from stand;with modified independence(LRAD PRN for safety and functional independence.) Additional ADL Goal #1: Pt will independently verbalize a plan to implement at least 3 learned energy conservation strategies into his daily routines/home environment for improved safety and functional independence upon hospital DC. Additional ADL Goal #2: Pt will independently verbalize a plan to implement at least 3 learned falls prevention strategies into his daily routines/home environment for improved safety and functional independence upon hospital DC.  OT Frequency:     Barriers to D/C: Inaccessible home environment          Co-evaluation              AM-PAC OT "6 Clicks" Daily Activity     Outcome Measure Help from another person eating meals?: None Help from another person taking care of personal grooming?: A Little Help from another person toileting, which includes using toliet, bedpan, or urinal?: A Little Help from another person bathing (including washing,  rinsing, drying)?: A Little Help from another person to put on and taking off regular upper body clothing?: None Help from another person to put on and taking off regular lower body clothing?: A Little 6 Click Score: 20   End of Session Equipment Utilized During Treatment: Gait belt;Rolling walker  Activity Tolerance: Patient tolerated treatment well Patient left: in chair;with call bell/phone within reach;with chair alarm set  OT Visit Diagnosis: Other abnormalities of gait and mobility (R26.89);Muscle weakness (generalized)  (M62.81)                Time: 4827-0786 OT Time Calculation (min): 29 min Charges:  OT General Charges $OT Visit: 1 Visit OT Evaluation $OT Eval Moderate Complexity: 1 Mod OT Treatments $Self Care/Home Management : 23-37 mins  Shara Blazing, M.S., OTR/L Ascom: [phone] 01/17/20, 10:42 AM

## 2020-01-17 NOTE — Progress Notes (Signed)
Discontinued amlodipine since patient is noted to be orthostatic

## 2020-01-17 NOTE — Plan of Care (Signed)
  Problem: Education: Goal: Knowledge of General Education information will improve Description: Including pain rating scale, medication(s)/side effects and non-pharmacologic comfort measures Outcome: Progressing   Problem: Health Behavior/Discharge Planning: Goal: Ability to manage health-related needs will improve Outcome: Progressing   Problem: Clinical Measurements: Goal: Ability to maintain clinical measurements within normal limits will improve Outcome: Not Progressing Note: Both troponins and BNP are elevated. Will continue to monitor lab values for the remainder of the shift. Wenda Low [facility]

## 2020-01-17 NOTE — Progress Notes (Signed)
[facility], [place] 01/17/20  Subjective:   LOS: 1  Patient known to our practice from outpatient in previous admission He was sent over from dialysis unit for evaluation of chest pain during dialysis  patient describes it as chest tightness in his right chest.    States he has been coughing up some junk but overall feels better Currently on room air and getting ready to work with physical therapist    Objective:  Vital signs in last 24 hours:  Temp:  [97.6 F (36.4 C)-98.4 F (36.9 C)] 98.4 F (36.9 C) (05/06 0731) Pulse Rate:  [78-92] 89 (05/06 0731) Resp:  [12-23] 18 (05/06 0731) BP: (112-171)/(68-94) 124/85 (05/06 0731) SpO2:  [85 %-100 %] 98 % (05/06 0731) Weight:  [65.3 kg-65.8 kg] 65.7 kg (05/06 0324)  Weight change:  Filed Weights   01/16/20 1041 01/16/20 2245 01/17/20 0324  Weight: 65.8 kg 65.3 kg 65.7 kg    Intake/Output:    Intake/Output Summary (Last 24 hours) at 01/17/2020 1037 Last data filed at 01/17/2020 0940 Gross per 24 hour  Intake 240 ml  Output 0 ml  Net 240 ml     Physical Exam: General:  No acute distress, laying in the bed  HEENT  anicteric, moist oral mucosa.  Pulm/lungs  normal breathing effort, scattered rhonchi and coarse breath sounds, room air  CVS/Heart  no rub  Abdomen:   Soft, nontender  Extremities:  Trace edema  Neurologic:  Lethargic but able to answer questions  Skin:  Warm  Access:  Left upper extremity AV fistula       Basic Metabolic Panel:  Recent Labs  Lab 01/11/20 0625 01/16/20 1012 01/17/20 0426  NA  --  134* 134*  K 5.2* 3.0* 4.5  CL  --  96* 96*  CO2  --  25 25  GLUCOSE  --  92 136*  BUN  --  18 36*  CREATININE  --  3.68* 5.78*  CALCIUM  --  8.5* 9.2  MG  --   --  2.3     CBC: Recent Labs  Lab 01/16/20 1012 01/17/20 0426  WBC 15.7* 23.5*  NEUTROABS 11.4*  --   HGB 9.4* 10.1*  HCT 27.8* 28.6*  MCV 107.8* 105.1*  PLT 449* 485*      Lab Results  Component  Value Date   HEPBSAG Negative 06/04/2019      Microbiology:  Recent Results (from the past 240 hour(s))  Respiratory Panel by RT PCR (Flu A&B, Covid) - Nasopharyngeal Swab     Status: None   Collection Time: 01/16/20 12:39 PM   Specimen: Nasopharyngeal Swab  Result Value Ref Range Status   SARS Coronavirus 2 by RT PCR NEGATIVE NEGATIVE Final    Comment: (NOTE) SARS-CoV-2 target nucleic acids are NOT DETECTED. The SARS-CoV-2 RNA is generally detectable in upper respiratoy specimens during the acute phase of infection. The lowest concentration of SARS-CoV-2 viral copies this assay can detect is 131 copies/mL. A negative result does not preclude SARS-Cov-2 infection and should not be used as the sole basis for treatment or other patient management decisions. A negative result may occur with  improper specimen collection/handling, submission of specimen other than nasopharyngeal swab, presence of viral mutation(s) within the areas targeted by this assay, and inadequate number of viral copies (<131 copies/mL). A negative result must be combined with clinical observations, patient history, and epidemiological information. The expected result is Negative. Fact Sheet for Patients:  PinkCheek.be Fact Sheet  for Healthcare Providers:  GravelBags.it This test is not yet ap proved or cleared by the [place] and  has been authorized for detection and/or diagnosis of SARS-CoV-2 by FDA under an Emergency Use Authorization (EUA). This EUA will remain  in effect (meaning this test can be used) for the duration of the COVID-19 declaration under Section 564(b)(1) of the Act, 21 U.S.C. section 360bbb-3(b)(1), unless the authorization is terminated or revoked sooner.    Influenza A by PCR NEGATIVE NEGATIVE Final   Influenza B by PCR NEGATIVE NEGATIVE Final    Comment: (NOTE) The Xpert Xpress SARS-CoV-2/FLU/RSV assay is intended as  an aid in  the diagnosis of influenza from Nasopharyngeal swab specimens and  should not be used as a sole basis for treatment. Nasal washings and  aspirates are unacceptable for Xpert Xpress SARS-CoV-2/FLU/RSV  testing. Fact Sheet for Patients: PinkCheek.be Fact Sheet for Healthcare Providers: GravelBags.it This test is not yet approved or cleared by the [place] FDA and  has been authorized for detection and/or diagnosis of SARS-CoV-2 by  FDA under an Emergency Use Authorization (EUA). This EUA will remain  in effect (meaning this test can be used) for the duration of the  Covid-19 declaration under Section 564(b)(1) of the Act, 21  U.S.C. section 360bbb-3(b)(1), unless the authorization is  terminated or revoked. Performed at [facility], [address] [place],  [address]     Coagulation Studies: No results for input(s): LABPROT, INR in the last 72 hours.  Urinalysis: No results for input(s): COLORURINE, LABSPEC, PHURINE, GLUCOSEU, HGBUR, BILIRUBINUR, KETONESUR, PROTEINUR, UROBILINOGEN, NITRITE, LEUKOCYTESUR in the last 72 hours.  Invalid input(s): APPERANCEUR    Imaging: DG Chest 2 View  Result Date: 01/16/2020 CLINICAL DATA:  Shortness of breath EXAM: CHEST - 2 VIEW COMPARISON:  December 18, 2019 FINDINGS: Central catheter tip is at the cavoatrial junction. No pneumothorax. There are scattered areas of interstitial thickening with scarring in the left base, stable. No new opacity evident. Heart size and pulmonary vascularity are normal. No adenopathy. Stent noted in left subclavian region. Status post coronary artery bypass grafting. There is aortic atherosclerosis. No adenopathy. No bone lesions. IMPRESSION: Central catheter tip at cavoatrial junction. No pneumothorax. Areas of interstitial thickening bilaterally are stable as is scarring in the left base. No edema or airspace opacity. Stable cardiac  silhouette. Postoperative changes noted. Aortic Atherosclerosis (ICD10-I70.0). Electronically Signed   By: Lowella Grip III M.D.   On: 01/16/2020 11:06   CT HEAD WO CONTRAST  Result Date: 01/16/2020 CLINICAL DATA:  Headache. Head trauma. EXAM: CT HEAD WITHOUT CONTRAST TECHNIQUE: Contiguous axial images were obtained from the base of the skull through the vertex without intravenous contrast. COMPARISON:  12/18/2019 FINDINGS: Brain: No evidence of acute infarction, hemorrhage, hydrocephalus, extra-axial collection or mass lesion/mass effect. Atrophy and chronic microvascular ischemic changes are again noted. There is an old left thalamic lacunar infarct Vascular: No hyperdense vessel or unexpected calcification. Skull: Normal. Negative for fracture or focal lesion. Sinuses/Orbits: There is new opacification of the right maxillary sinus. There is mild opacification of the right frontal sinus. The remaining paranasal sinuses and mastoid air cells are essentially clear. Other: None. IMPRESSION: 1. No acute intracranial abnormality. 2. Atrophy and chronic microvascular ischemic changes. Electronically Signed   By: Constance Holster M.D.   On: 01/16/2020 15:09   NM PULMONARY VENT AND PERF (V/Q Scan)  Result Date: 01/16/2020 CLINICAL DATA:  Right chest pain during dialysis today, shortness of breath EXAM: NUCLEAR  MEDICINE VENTILATION - PERFUSION LUNG SCAN TECHNIQUE: Ventilation images were obtained in multiple projections using inhaled aerosol Tc-26m DTPA. Perfusion images were obtained in multiple projections after intravenous injection of Tc-75m MAA. RADIOPHARMACEUTICALS:  32.82 mCi of Tc-32m DTPA aerosol inhalation and 5.32 mCi Tc40m MAA IV COMPARISON:  01/16/2020, 09/24/2019 FINDINGS: Ventilation: Deposition of radiotracer within the central airways. No segmental ventilation defects. Ingested radiotracer within the esophagus and proximal gastrointestinal tract. Perfusion: There are no significant perfusion  defects. No segmental or subsegmental perfusion defects are identified. IMPRESSION: 1. Low probability study for pulmonary embolus. Electronically Signed   By: Randa Ngo M.D.   On: 01/16/2020 15:09     Medications:    . amLODipine  10 mg Oral QHS  . apixaban  2.5 mg Oral Daily  . aspirin EC  81 mg Oral Daily  . atorvastatin  20 mg Oral BH-q7a  . azithromycin  250 mg Oral Daily  . carvedilol  25 mg Oral BID WC  . cinacalcet  30 mg Oral Q supper  . ferric citrate  420 mg Oral TID WC  . fluticasone furoate-vilanterol  1 puff Inhalation Daily   And  . umeclidinium bromide  1 puff Inhalation Daily  . folic acid  1 mg Oral Daily  . furosemide  80 mg Oral BID  . gabapentin  100 mg Oral TID  . ipratropium-albuterol  3 mL Nebulization Q4H  . loratadine  10 mg Oral BH-q7a  . methylPREDNISolone (SOLU-MEDROL) injection  40 mg Intravenous Q12H  . multivitamin  1 tablet Oral QHS  . multivitamin with minerals  1 tablet Oral Daily  . nicotine  21 mg Transdermal Daily  . sevelamer carbonate  2,400 mg Oral TID WC   And  . sevelamer carbonate  800 mg Oral Daily  . thiamine  100 mg Oral Daily   Or  . thiamine  100 mg Intravenous Daily   acetaminophen, albuterol, calcium carbonate, dextromethorphan-guaiFENesin, docusate sodium, hydrALAZINE, HYDROcodone-acetaminophen, ondansetron (ZOFRAN) IV, zolpidem  Assessment/ Plan:  59 y.o. male with end-stage renal disease  COPD GERD Tobacco abuse Rheumatoid arthritis Atrial fibrillation with anticoagulation-Eliquis History of GI bleed History of CABG Diastolic congestive heart failure  was admitted on 01/16/2020 for  Principal Problem:   COPD exacerbation ([place]) Active Problems:   Tobacco abuse   Hyperlipidemia   Chronic diastolic heart failure (HCC)   ESRD (end stage renal disease) on dialysis [facility])   Atrial fibrillation, chronic ([place])   Fall   CAD (coronary artery disease)   Anemia in ESRD (end-stage renal disease) (HCC)   HTN  (hypertension)   Alcohol abuse   Acute on chronic respiratory failure with hypoxia (HCC)  Hypoxia [R09.02] COPD exacerbation (HCC) [J44.1] Dyspnea, unspecified type [R06.00]  #. ESRD Patient completed his HD treatment prior to arrival Electrolytes and Volume status are acceptable No acute indication for Dialysis at present we will schedule treatment on MWF schedule  #. Anemia of CKD  Lab Results  Component Value Date   HGB 10.1 (L) 01/17/2020   Low dose EPO with HD  #. Secondary hyperparathyroidism of renal origin N 25.81  No results found for: PTH Lab Results  Component Value Date   PHOS 4.0 05/31/2019   Monitor calcium and phos level during this admission  #.  Chest pain and shortness of breath, acute respiratory failure Hypoxia due to COPD exacerbation Treatment as per hospitalist team with bronchodilators and steroids   LOS: [place] 5/6/202110:37 AM  [facility],  [place] [phone]

## 2020-01-17 NOTE — Progress Notes (Signed)
PROGRESS NOTE    DMARIO RUSSOM  FHL:[id number] DOB: 06/07/1961 DOA: 01/16/2020 PCP: Verl Bangs, FNP   Brief Narrative:  Daryl Weeks is a 59 y.o. male with medical history significant of hypertension, hyperlipidemia, COPD, GERD, tobacco abuse, rheumatoid arthritis, atrial fibrillation on Eliquis, GI bleeding, ESRD-HD (MWF), CAD, CABG, anemia, dCHF, who presents with shortness breath.  Was unable to finish his dialysis session due to worsening shortness of breath and hypoxia.  He was desaturating up to 85% requiring 4 L.  Admitted for COPD exacerbation.  Subjective: Patient was feeling little better when seen today.  Continues to have some cough, shortness of breath seems improving.  Assessment & Plan:   Principal Problem:   COPD exacerbation ([place]) Active Problems:   Tobacco abuse   Hyperlipidemia   Chronic diastolic heart failure (HCC)   ESRD (end stage renal disease) on dialysis [facility])   Atrial fibrillation, chronic ([place])   Fall   CAD (coronary artery disease)   Anemia in ESRD (end-stage renal disease) (HCC)   HTN (hypertension)   Alcohol abuse   Acute on chronic respiratory failure with hypoxia (HCC)  Acute on chronic respiratory failure with hypoxia due to COPD exacerbation ([place]): Patient's shortness breath is likely due to COPD exacerbation given bilateral wheezing on admission.  No wheezing today.  VQ scan is low probability of PE.  Chest x-ray has no infiltration. -Discontinue Solu-Medrol and start him on prednisone 40 mg daily for 4 days. -Try weaning him off from oxygen, as patient does not use oxygen at baseline. -Continue Z-Pak. -Continue DuoNeb. -Continue supportive care with incentive spirometry and Mucinex.  Chronic diastolic heart failure ([place]): 2D echo on 05/29/2019 showed EF of 50-55%.  No leg edema.  CHF seem to be compensated -volume management per renal by dialysis  ESRD (end stage renal disease) on dialysis ([place]).  Nephrology is following and he will  continue dialysis on MWF.  Atrial fibrillation, chronic (HCC) -Continue Coreg and Eliquis  Fall: CT head is negative for acute intracranial abnormalities.  No focal neuro deficit on physical examination -PT/OT-recommending home health which were ordered.  CAD (coronary artery disease): Patient has elevated troponin 42, which is at baseline.  Will not cycle troponin -Continue aspirin, Lipitor  Anemia in ESRD (end-stage renal disease) ([place]): Hemoglobin stable.  8.8 on 12/19/2019 --> 9.4 today -Continue with EPO with dialysis. -Follow-up with CBC  HTN:  -Continue home medications: Amlodipine, Coreg -pt is also on Lasix 80 mg bid -hydralazine prn  Tobacco abuse and Alcohol abuse: -Nicotine patch -CIWA protocol  Hyperlipidemia -lipitor  Objective: Vitals:   01/17/20 0731 01/17/20 1045 01/17/20 1110 01/17/20 1145  BP: 124/85   126/68  Pulse: 89   73  Resp: 18   12  Temp: 98.4 F (36.9 C)     TempSrc:      SpO2: 98% 90% 90%   Weight:      Height:        Intake/Output Summary (Last 24 hours) at 01/17/2020 1357 Last data filed at 01/17/2020 1205 Gross per 24 hour  Intake 240 ml  Output 0 ml  Net 240 ml   Filed Weights   01/16/20 1041 01/16/20 2245 01/17/20 0324  Weight: 65.8 kg 65.3 kg 65.7 kg    Examination:  General exam: Appears calm and comfortable  Respiratory system: Clear to auscultation. Respiratory effort normal. Cardiovascular system: S1 & S2 heard, RRR. No JVD, murmurs, rubs, gallops or clicks. Gastrointestinal system: Soft, nontender, nondistended, bowel sounds positive.  Central nervous system: Alert and oriented. No focal neurological deficits.Symmetric 5 x 5 power. Extremities: No edema, no cyanosis, pulses intact and symmetrical. Psychiatry: Judgement and insight appear normal.   DVT prophylaxis: Eliquis Code Status: Full Family Communication: Discussed with patient Disposition Plan:  Status is: Inpatient  Remains inpatient appropriate  because:Inpatient level of care appropriate due to severity of illness   Dispo: The patient is from: Home              Anticipated d/c is to: Home              Anticipated d/c date is: 1 day              Patient currently is not medically stable to d/c.  We are trying to wean him off from oxygen.  Most likely will go back home tomorrow after dialysis.  Consultants:   Nephrology  Procedures:  Antimicrobials:  Azithromycin  Data Reviewed: I have personally reviewed following labs and imaging studies  CBC: Recent Labs  Lab 01/16/20 1012 01/17/20 0426  WBC 15.7* 23.5*  NEUTROABS 11.4*  --   HGB 9.4* 10.1*  HCT 27.8* 28.6*  MCV 107.8* 105.1*  PLT 449* 935*   Basic Metabolic Panel: Recent Labs  Lab 01/11/20 0625 01/16/20 1012 01/17/20 0426  NA  --  134* 134*  K 5.2* 3.0* 4.5  CL  --  96* 96*  CO2  --  25 25  GLUCOSE  --  92 136*  BUN  --  18 36*  CREATININE  --  3.68* 5.78*  CALCIUM  --  8.5* 9.2  MG  --   --  2.3   GFR: Estimated Creatinine Clearance: 13.3 mL/min (A) (by C-G formula based on SCr of 5.78 mg/dL (H)). Liver Function Tests: No results for input(s): AST, ALT, ALKPHOS, BILITOT, PROT, ALBUMIN in the last 168 hours. No results for input(s): LIPASE, AMYLASE in the last 168 hours. No results for input(s): AMMONIA in the last 168 hours. Coagulation Profile: No results for input(s): INR, PROTIME in the last 168 hours. Cardiac Enzymes: No results for input(s): CKTOTAL, CKMB, CKMBINDEX, TROPONINI in the last 168 hours. BNP (last 3 results) No results for input(s): PROBNP in the last 8760 hours. HbA1C: No results for input(s): HGBA1C in the last 72 hours. CBG: No results for input(s): GLUCAP in the last 168 hours. Lipid Profile: No results for input(s): CHOL, HDL, LDLCALC, TRIG, CHOLHDL, LDLDIRECT in the last 72 hours. Thyroid Function Tests: No results for input(s): TSH, T4TOTAL, FREET4, T3FREE, THYROIDAB in the last 72 hours. Anemia Panel: No results  for input(s): VITAMINB12, FOLATE, FERRITIN, TIBC, IRON, RETICCTPCT in the last 72 hours. Sepsis Labs: No results for input(s): PROCALCITON, LATICACIDVEN in the last 168 hours.  Recent Results (from the past 240 hour(s))  Respiratory Panel by RT PCR (Flu A&B, Covid) - Nasopharyngeal Swab     Status: None   Collection Time: 01/16/20 12:39 PM   Specimen: Nasopharyngeal Swab  Result Value Ref Range Status   SARS Coronavirus 2 by RT PCR NEGATIVE NEGATIVE Final    Comment: (NOTE) SARS-CoV-2 target nucleic acids are NOT DETECTED. The SARS-CoV-2 RNA is generally detectable in upper respiratoy specimens during the acute phase of infection. The lowest concentration of SARS-CoV-2 viral copies this assay can detect is 131 copies/mL. A negative result does not preclude SARS-Cov-2 infection and should not be used as the sole basis for treatment or other patient management decisions. A negative result may occur with  improper specimen collection/handling, submission of specimen other than nasopharyngeal swab, presence of viral mutation(s) within the areas targeted by this assay, and inadequate number of viral copies (<131 copies/mL). A negative result must be combined with clinical observations, patient history, and epidemiological information. The expected result is Negative. Fact Sheet for Patients:  PinkCheek.be Fact Sheet for Healthcare Providers:  GravelBags.it This test is not yet ap proved or cleared by the [place] FDA and  has been authorized for detection and/or diagnosis of SARS-CoV-2 by FDA under an Emergency Use Authorization (EUA). This EUA will remain  in effect (meaning this test can be used) for the duration of the COVID-19 declaration under Section 564(b)(1) of the Act, 21 U.S.C. section 360bbb-3(b)(1), unless the authorization is terminated or revoked sooner.    Influenza A by PCR NEGATIVE NEGATIVE Final    Influenza B by PCR NEGATIVE NEGATIVE Final    Comment: (NOTE) The Xpert Xpress SARS-CoV-2/FLU/RSV assay is intended as an aid in  the diagnosis of influenza from Nasopharyngeal swab specimens and  should not be used as a sole basis for treatment. Nasal washings and  aspirates are unacceptable for Xpert Xpress SARS-CoV-2/FLU/RSV  testing. Fact Sheet for Patients: PinkCheek.be Fact Sheet for Healthcare Providers: GravelBags.it This test is not yet approved or cleared by the [place] FDA and  has been authorized for detection and/or diagnosis of SARS-CoV-2 by  FDA under an Emergency Use Authorization (EUA). This EUA will remain  in effect (meaning this test can be used) for the duration of the  Covid-19 declaration under Section 564(b)(1) of the Act, 21  U.S.C. section 360bbb-3(b)(1), unless the authorization is  terminated or revoked. Performed at [facility], [address] [place], [place] [address]   Culture, sputum-assessment     Status: None   Collection Time: 01/17/20  9:45 AM   Specimen: Expectorated Sputum  Result Value Ref Range Status   Specimen Description EXPECTORATED SPUTUM  Final   Special Requests NONE  Final   Sputum evaluation   Final    THIS SPECIMEN IS ACCEPTABLE FOR SPUTUM CULTURE Performed at [facility], [address], [place],  [address]    Report Status 01/17/2020 FINAL  Final     Radiology Studies: DG Chest 2 View  Result Date: 01/16/2020 CLINICAL DATA:  Shortness of breath EXAM: CHEST - 2 VIEW COMPARISON:  December 18, 2019 FINDINGS: Central catheter tip is at the cavoatrial junction. No pneumothorax. There are scattered areas of interstitial thickening with scarring in the left base, stable. No new opacity evident. Heart size and pulmonary vascularity are normal. No adenopathy. Stent noted in left subclavian region. Status post coronary artery bypass grafting. There is  aortic atherosclerosis. No adenopathy. No bone lesions. IMPRESSION: Central catheter tip at cavoatrial junction. No pneumothorax. Areas of interstitial thickening bilaterally are stable as is scarring in the left base. No edema or airspace opacity. Stable cardiac silhouette. Postoperative changes noted. Aortic Atherosclerosis (ICD10-I70.0). Electronically Signed   By: Lowella Grip III M.D.   On: 01/16/2020 11:06   CT HEAD WO CONTRAST  Result Date: 01/16/2020 CLINICAL DATA:  Headache. Head trauma. EXAM: CT HEAD WITHOUT CONTRAST TECHNIQUE: Contiguous axial images were obtained from the base of the skull through the vertex without intravenous contrast. COMPARISON:  12/18/2019 FINDINGS: Brain: No evidence of acute infarction, hemorrhage, hydrocephalus, extra-axial collection or mass lesion/mass effect. Atrophy and chronic microvascular ischemic changes are again noted. There is an old left thalamic lacunar infarct Vascular: No hyperdense vessel  or unexpected calcification. Skull: Normal. Negative for fracture or focal lesion. Sinuses/Orbits: There is new opacification of the right maxillary sinus. There is mild opacification of the right frontal sinus. The remaining paranasal sinuses and mastoid air cells are essentially clear. Other: None. IMPRESSION: 1. No acute intracranial abnormality. 2. Atrophy and chronic microvascular ischemic changes. Electronically Signed   By: Constance Holster M.D.   On: 01/16/2020 15:09   NM PULMONARY VENT AND PERF (V/Q Scan)  Result Date: 01/16/2020 CLINICAL DATA:  Right chest pain during dialysis today, shortness of breath EXAM: NUCLEAR MEDICINE VENTILATION - PERFUSION LUNG SCAN TECHNIQUE: Ventilation images were obtained in multiple projections using inhaled aerosol Tc-3m DTPA. Perfusion images were obtained in multiple projections after intravenous injection of Tc-17m MAA. RADIOPHARMACEUTICALS:  32.82 mCi of Tc-40m DTPA aerosol inhalation and 5.32 mCi Tc25m MAA IV  COMPARISON:  01/16/2020, 09/24/2019 FINDINGS: Ventilation: Deposition of radiotracer within the central airways. No segmental ventilation defects. Ingested radiotracer within the esophagus and proximal gastrointestinal tract. Perfusion: There are no significant perfusion defects. No segmental or subsegmental perfusion defects are identified. IMPRESSION: 1. Low probability study for pulmonary embolus. Electronically Signed   By: Randa Ngo M.D.   On: 01/16/2020 15:09    Scheduled Meds:  amLODipine  10 mg Oral QHS   apixaban  5 mg Oral BID   aspirin EC  81 mg Oral Daily   atorvastatin  20 mg Oral BH-q7a   azithromycin  250 mg Oral Daily   carvedilol  25 mg Oral BID WC   [START ON 01/18/2020] Chlorhexidine Gluconate Cloth  6 each Topical Q0600   cinacalcet  30 mg Oral Q supper   ferric citrate  420 mg Oral TID WC   fluticasone furoate-vilanterol  1 puff Inhalation Daily   And   umeclidinium bromide  1 puff Inhalation Daily   folic acid  1 mg Oral Daily   furosemide  80 mg Oral BID   gabapentin  100 mg Oral TID   ipratropium-albuterol  3 mL Nebulization Q4H   loratadine  10 mg Oral BH-q7a   methylPREDNISolone (SOLU-MEDROL) injection  40 mg Intravenous Q12H   multivitamin  1 tablet Oral QHS   multivitamin with minerals  1 tablet Oral Daily   nicotine  21 mg Transdermal Daily   sevelamer carbonate  2,400 mg Oral TID WC   And   sevelamer carbonate  800 mg Oral Daily   thiamine  100 mg Oral Daily   Or   thiamine  100 mg Intravenous Daily   Continuous Infusions:   LOS: 1 day   Time spent: 40 minutes  Lorella Nimrod, MD [facility]  If 7PM-7AM, please contact night-coverage [email or web address]  01/17/2020, 1:57 PM   This record has been created using Systems analyst. Errors have been sought and corrected,but may not always be located. Such creation errors do not reflect on the standard of care.

## 2020-01-18 MED ORDER — DM-GUAIFENESIN ER 30-600 MG PO TB12: 1.0000 | ORAL_TABLET | Freq: Two times a day (BID) | ORAL | 0 refills | Status: AC | PRN

## 2020-01-18 MED ORDER — PREDNISONE 20 MG PO TABS: 40.0000 mg | ORAL_TABLET | Freq: Every day | ORAL | 0 refills | Status: AC

## 2020-01-18 MED ORDER — FOLIC ACID 1 MG PO TABS: 1.0000 mg | ORAL_TABLET | Freq: Every day | ORAL | 1 refills | Status: AC

## 2020-01-18 MED ORDER — APIXABAN 5 MG PO TABS: 5.0000 mg | ORAL_TABLET | Freq: Two times a day (BID) | ORAL | 1 refills | Status: AC

## 2020-01-18 MED ORDER — AZITHROMYCIN 250 MG PO TABS: 250.0000 mg | ORAL_TABLET | Freq: Every day | ORAL | 0 refills | Status: AC

## 2020-01-18 NOTE — Progress Notes (Signed)
HD ended 

## 2020-01-18 NOTE — Progress Notes (Signed)
TX started 

## 2020-01-18 NOTE — Progress Notes (Signed)
[facility], [place] 01/18/20  Subjective:   LOS: 2  Patient known to our practice from outpatient in previous admission He was sent over from dialysis unit for evaluation of chest pain during dialysis  patient describes it as chest tightness in his right chest.    feels better today Denies any acute complaints   HEMODIALYSIS FLOWSHEET:  Blood Flow Rate (mL/min): 400 mL/min Arterial Pressure (mmHg): -180 mmHg Venous Pressure (mmHg): 130 mmHg Transmembrane Pressure (mmHg): 50 mmHg Ultrafiltration Rate (mL/min): 460 mL/min Dialysate Flow Rate (mL/min): 400 ml/min Conductivity: Machine : 14 Conductivity: Machine : 14 Dialysis Fluid Bolus: Normal Saline Bolus Amount (mL): 250 mL      Objective:  Vital signs in last 24 hours:  Temp:  [97.5 F (36.4 C)-98.2 F (36.8 C)] 97.5 F (36.4 C) (05/07 0743) Pulse Rate:  [70-88] 82 (05/07 1345) Resp:  [12-22] 16 (05/07 1345) BP: (117-149)/(54-77) 137/72 (05/07 1345) SpO2:  [76 %-100 %] 91 % (05/07 1345) Weight:  [70.5 kg] 70.5 kg (05/07 1010)  Weight change:  Filed Weights   01/16/20 2245 01/17/20 0324 01/18/20 1010  Weight: 65.3 kg 65.7 kg 70.5 kg    Intake/Output:    Intake/Output Summary (Last 24 hours) at 01/18/2020 1458 Last data filed at 01/18/2020 1342 Gross per 24 hour  Intake 480 ml  Output 1000 ml  Net -520 ml     Physical Exam: General:  No acute distress, laying in the bed  HEENT  anicteric, moist oral mucosa.  Pulm/lungs  normal breathing effort, coarse breath sounds, room air  CVS/Heart  no rub, regular rhythm  Abdomen:   Soft, nontender  Extremities:  Trace edema  Neurologic:  Lethargic but able to answer questions  Skin:  Warm  Access:  Left upper extremity AV fistula   Right IJ PermCath    Basic Metabolic Panel:  Recent Labs  Lab 01/16/20 1012 01/17/20 0426  NA 134* 134*  K 3.0* 4.5  CL 96* 96*  CO2 25 25  GLUCOSE 92 136*  BUN 18 36*  CREATININE 3.68*  5.78*  CALCIUM 8.5* 9.2  MG  --  2.3     CBC: Recent Labs  Lab 01/16/20 1012 01/17/20 0426  WBC 15.7* 23.5*  NEUTROABS 11.4*  --   HGB 9.4* 10.1*  HCT 27.8* 28.6*  MCV 107.8* 105.1*  PLT 449* 485*      Lab Results  Component Value Date   HEPBSAG Negative 06/04/2019      Microbiology:  Recent Results (from the past 240 hour(s))  Respiratory Panel by RT PCR (Flu A&B, Covid) - Nasopharyngeal Swab     Status: None   Collection Time: 01/16/20 12:39 PM   Specimen: Nasopharyngeal Swab  Result Value Ref Range Status   SARS Coronavirus 2 by RT PCR NEGATIVE NEGATIVE Final    Comment: (NOTE) SARS-CoV-2 target nucleic acids are NOT DETECTED. The SARS-CoV-2 RNA is generally detectable in upper respiratoy specimens during the acute phase of infection. The lowest concentration of SARS-CoV-2 viral copies this assay can detect is 131 copies/mL. A negative result does not preclude SARS-Cov-2 infection and should not be used as the sole basis for treatment or other patient management decisions. A negative result may occur with  improper specimen collection/handling, submission of specimen other than nasopharyngeal swab, presence of viral mutation(s) within the areas targeted by this assay, and inadequate number of viral copies (<131 copies/mL). A negative result must be combined with clinical observations, patient history, and epidemiological information.  The expected result is Negative. Fact Sheet for Patients:  PinkCheek.be Fact Sheet for Healthcare Providers:  GravelBags.it This test is not yet ap proved or cleared by the [place] FDA and  has been authorized for detection and/or diagnosis of SARS-CoV-2 by FDA under an Emergency Use Authorization (EUA). This EUA will remain  in effect (meaning this test can be used) for the duration of the COVID-19 declaration under Section 564(b)(1) of the Act, 21 U.S.C. section  360bbb-3(b)(1), unless the authorization is terminated or revoked sooner.    Influenza A by PCR NEGATIVE NEGATIVE Final   Influenza B by PCR NEGATIVE NEGATIVE Final    Comment: (NOTE) The Xpert Xpress SARS-CoV-2/FLU/RSV assay is intended as an aid in  the diagnosis of influenza from Nasopharyngeal swab specimens and  should not be used as a sole basis for treatment. Nasal washings and  aspirates are unacceptable for Xpert Xpress SARS-CoV-2/FLU/RSV  testing. Fact Sheet for Patients: PinkCheek.be Fact Sheet for Healthcare Providers: GravelBags.it This test is not yet approved or cleared by the [place] FDA and  has been authorized for detection and/or diagnosis of SARS-CoV-2 by  FDA under an Emergency Use Authorization (EUA). This EUA will remain  in effect (meaning this test can be used) for the duration of the  Covid-19 declaration under Section 564(b)(1) of the Act, 21  U.S.C. section 360bbb-3(b)(1), unless the authorization is  terminated or revoked. Performed at [facility], [address] [place], [place] [address]   Culture, sputum-assessment     Status: None   Collection Time: 01/17/20  9:45 AM   Specimen: Expectorated Sputum  Result Value Ref Range Status   Specimen Description EXPECTORATED SPUTUM  Final   Special Requests NONE  Final   Sputum evaluation   Final    THIS SPECIMEN IS ACCEPTABLE FOR SPUTUM CULTURE Performed at [facility], [address] [place], [place] [address]    Report Status 01/17/2020 FINAL  Final  Culture, respiratory     Status: None (Preliminary result)   Collection Time: 01/17/20  9:45 AM  Result Value Ref Range Status   Specimen Description   Final    EXPECTORATED SPUTUM Performed at [facility], [address], [place], [place] [address]    Special Requests   Final    NONE Reflexed from [id number] Performed at [facility], [address] [place], [place] [address]    Gram Stain   Final    NO WBC SEEN MODERATE SQUAMOUS EPITHELIAL CELLS PRESENT MODERATE YEAST MODERATE GRAM POSITIVE RODS FEW GRAM POSITIVE COCCI    Culture   Final    CULTURE REINCUBATED FOR BETTER GROWTH Performed at [facility], [place] [address], [place], [place] [address]    Report Status PENDING  Incomplete    Coagulation Studies: No results for input(s): LABPROT, INR in the last 72 hours.  Urinalysis: No results for input(s): COLORURINE, LABSPEC, PHURINE, GLUCOSEU, HGBUR, BILIRUBINUR, KETONESUR, PROTEINUR, UROBILINOGEN, NITRITE, LEUKOCYTESUR in the last 72 hours.  Invalid input(s): APPERANCEUR    Imaging: CT HEAD WO CONTRAST  Result Date: 01/16/2020 CLINICAL DATA:  Headache. Head trauma. EXAM: CT HEAD WITHOUT CONTRAST TECHNIQUE: Contiguous axial images were obtained from the base of the skull through the vertex without intravenous contrast. COMPARISON:  12/18/2019 FINDINGS: Brain: No evidence of acute infarction, hemorrhage, hydrocephalus, extra-axial collection or mass lesion/mass effect. Atrophy and chronic microvascular ischemic changes are again noted. There is an old left thalamic lacunar infarct Vascular: No hyperdense  vessel or unexpected calcification. Skull: Normal. Negative for fracture or focal lesion. Sinuses/Orbits: There is new opacification of the right maxillary sinus. There is mild opacification of the right frontal sinus. The remaining paranasal sinuses and mastoid air cells are essentially clear. Other: None. IMPRESSION: 1. No acute intracranial abnormality. 2. Atrophy and chronic microvascular ischemic changes. Electronically Signed   By: Constance Holster M.D.   On: 01/16/2020 15:09   NM PULMONARY VENT AND PERF (V/Q Scan)  Result Date: 01/16/2020 CLINICAL DATA:  Right chest pain during dialysis today, shortness of breath EXAM: NUCLEAR MEDICINE VENTILATION - PERFUSION LUNG SCAN TECHNIQUE: Ventilation images were obtained in  multiple projections using inhaled aerosol Tc-34m DTPA. Perfusion images were obtained in multiple projections after intravenous injection of Tc-86m MAA. RADIOPHARMACEUTICALS:  32.82 mCi of Tc-26m DTPA aerosol inhalation and 5.32 mCi Tc51m MAA IV COMPARISON:  01/16/2020, 09/24/2019 FINDINGS: Ventilation: Deposition of radiotracer within the central airways. No segmental ventilation defects. Ingested radiotracer within the esophagus and proximal gastrointestinal tract. Perfusion: There are no significant perfusion defects. No segmental or subsegmental perfusion defects are identified. IMPRESSION: 1. Low probability study for pulmonary embolus. Electronically Signed   By: Randa Ngo M.D.   On: 01/16/2020 15:09     Medications:    . apixaban  5 mg Oral BID  . aspirin EC  81 mg Oral Daily  . atorvastatin  20 mg Oral BH-q7a  . azithromycin  250 mg Oral Daily  . carvedilol  25 mg Oral BID WC  . Chlorhexidine Gluconate Cloth  6 each Topical Q0600  . cinacalcet  30 mg Oral Q supper  . ferric citrate  420 mg Oral TID WC  . fluticasone furoate-vilanterol  1 puff Inhalation Daily   And  . umeclidinium bromide  1 puff Inhalation Daily  . folic acid  1 mg Oral Daily  . furosemide  80 mg Oral BID  . gabapentin  100 mg Oral TID  . ipratropium-albuterol  3 mL Nebulization Q6H  . loratadine  10 mg Oral BH-q7a  . multivitamin  1 tablet Oral QHS  . multivitamin with minerals  1 tablet Oral Daily  . nicotine  21 mg Transdermal Daily  . predniSONE  40 mg Oral Q breakfast  . sevelamer carbonate  2,400 mg Oral TID WC   And  . sevelamer carbonate  800 mg Oral Daily  . thiamine  100 mg Oral Daily   Or  . thiamine  100 mg Intravenous Daily   acetaminophen, albuterol, calcium carbonate, dextromethorphan-guaiFENesin, docusate sodium, hydrALAZINE, HYDROcodone-acetaminophen, ondansetron (ZOFRAN) IV, zolpidem  Assessment/ Plan:  59 y.o. male with end-stage renal disease  COPD GERD Tobacco  abuse Rheumatoid arthritis Atrial fibrillation with anticoagulation-Eliquis History of GI bleed History of CABG Diastolic congestive heart failure  was admitted on 01/16/2020 for  Principal Problem:   COPD exacerbation ([place]) Active Problems:   Tobacco abuse   Hyperlipidemia   Chronic diastolic heart failure (HCC)   ESRD (end stage renal disease) on dialysis [facility]   Atrial fibrillation, chronic ([place])   Fall   CAD (coronary artery disease)   Anemia in ESRD (end-stage renal disease) (HCC)   HTN (hypertension)   Alcohol abuse   Acute on chronic respiratory failure with hypoxia (HCC)  Hypoxia [R09.02] COPD exacerbation (HCC) [J44.1] Dyspnea, unspecified type [R06.00]  #. ESRD Patient seen during dialysis Tolerating well    #. Anemia of CKD  Lab Results  Component Value Date   HGB 10.1 (L) 01/17/2020   Low dose  EPO with HD  #. Secondary hyperparathyroidism of renal origin N 25.81  No results found for: PTH Lab Results  Component Value Date   PHOS 4.0 05/31/2019   Monitor calcium and phos level during this admission  #.  Chest pain and shortness of breath, acute respiratory failure Hypoxia due to COPD exacerbation Treatment as per hospitalist team with bronchodilators and steroids   LOS: [place] 5/7/20212:58 PM  [place], [place]

## 2020-01-18 NOTE — Discharge Summary (Signed)
Physician Discharge Summary  SEHAJ MCENROE JIR:[id number] DOB: 1961/04/09 DOA: 01/16/2020  PCP: Verl Bangs, FNP  Admit date: 01/16/2020 Discharge date: 01/18/2020  Admitted From: Home Disposition:  Home  Recommendations for Outpatient Follow-up:  1. Follow up with PCP in 1-2 weeks 2. Follow-up with hemodialysis and nephrology. 3. Please obtain BMP/CBC in one week 4. Please follow up on the following pending results:None  Home Health: Yes Equipment/Devices:None Discharge Condition: Stable CODE STATUS: Full Diet recommendation: Heart Healthy    Brief/Interim Summary: Jena Gauss a 59 y.o.malewith medical history significant ofhypertension, hyperlipidemia, COPD, GERD, tobacco abuse, rheumatoid arthritis, atrial fibrillation on Eliquis, GI bleeding, ESRD-HD (MWF), CAD, CABG, anemia,dCHF, who presents with shortness breath.  Was unable to finish his dialysis session due to worsening shortness of breath and hypoxia.  He was desaturating up to 85% requiring 4 L.  Admitted for COPD exacerbation.  He was able to weaned off from oxygen by next day.  No more wheezing.  He was discharged home for 3 more days of azithromycin and prednisone to complete a 5-day course.  His home dose of amlodipine was discontinued at nephrology's request due to concern of positive orthostasis.  His home dose of Eliquis was increased to 5 mg twice daily with pharmacy recommendations as patient is on dialysis now.  Patient has an history of diastolic heart failure, no acute concern and he appears euvolemic.  There was some concern of fall.  CT has were negative for any acute abnormalities.  We obtain PT evaluation and they recommend home health services which were arranged.  Patient to have mildly elevated troponin and 42 which is his baseline due to kidney disease.  No chest pain or any other concern of ACS at this time.  He will continue with rest of his home meds.  Discharge Diagnoses:  Principal  Problem:   COPD exacerbation ([place]) Active Problems:   Tobacco abuse   Hyperlipidemia   Chronic diastolic heart failure (HCC)   ESRD (end stage renal disease) on dialysis [facility])   Atrial fibrillation, chronic (HCC)   Fall   CAD (coronary artery disease)   Anemia in ESRD (end-stage renal disease) (HCC)   HTN (hypertension)   Alcohol abuse   Acute on chronic respiratory failure with hypoxia [facility])  Discharge Instructions  Discharge Instructions    Diet - low sodium heart healthy   Complete by: As directed    Discharge instructions   Complete by: As directed    It was pleasure taking care of you. You were given 3 more days of prednisone and azithromycin for your COPD exacerbation. We discontinued amlodipine at the request of your nephrologist due to concern of dizziness and you will continue with rest of your home meds. We increased the dose of Eliquis as you are on dialysis now, prior dose was for kidney disease. Please continue with dialysis and follow-up with your nephrologist and primary care physician. We also arranged [facility] home health services to help you take care of yourself at home.   Increase activity slowly   Complete by: As directed      Allergies as of 01/18/2020      Reactions   Lisinopril Hives      Medication List    STOP taking these medications   amLODipine 10 MG tablet Commonly known as: NORVASC   levofloxacin 500 MG tablet Commonly known as: LEVAQUIN     TAKE these medications   albuterol 108 (90 Base) MCG/ACT inhaler Commonly known as:  VENTOLIN HFA Inhale 2 puffs into the lungs every 4 (four) hours as needed for shortness of breath or wheezing.   apixaban 5 MG Tabs tablet Commonly known as: ELIQUIS Take 1 tablet (5 mg total) by mouth 2 (two) times daily. What changed:   medication strength  how much to take  when to take this   aspirin EC 81 MG tablet Take 1 tablet (81 mg total) by mouth daily.   atorvastatin 20 MG tablet Commonly  known as: LIPITOR take 1 tablet by mouth at bedtime What changed:   how much to take  how to take this  when to take this  additional instructions   Auryxia 1 GM 210 MG(Fe) tablet Generic drug: ferric citrate Take 420 mg by mouth 3 (three) times daily with meals.   azithromycin 250 MG tablet Commonly known as: ZITHROMAX Take 1 tablet (250 mg total) by mouth daily for 3 days.   calcium carbonate 500 MG chewable tablet Commonly known as: TUMS - dosed in mg elemental calcium Chew 1 tablet by mouth daily as needed for indigestion or heartburn.   carvedilol 12.5 MG tablet Commonly known as: COREG Take 2 tablets (25 mg total) by mouth 2 (two) times daily with a meal.   dextromethorphan-guaiFENesin 30-600 MG 12hr tablet Commonly known as: MUCINEX DM Take 1 tablet by mouth 2 (two) times daily as needed for cough.   docusate sodium 100 MG capsule Commonly known as: COLACE Take 1 capsule (100 mg total) by mouth 2 (two) times daily as needed for mild constipation.   Flovent Diskus 100 MCG/BLIST Aepb Generic drug: Fluticasone Propionate (Inhal) Inhale 1 puff into the lungs 2 (two) times daily. What changed: when to take this   folic acid 1 MG tablet Commonly known as: FOLVITE Take 1 tablet (1 mg total) by mouth daily.   furosemide 80 MG tablet Commonly known as: LASIX Take 80 mg by mouth 2 (two) times daily.   gabapentin 100 MG capsule Commonly known as: NEURONTIN Take 100 mg by mouth 3 (three) times daily.   HYDROcodone-acetaminophen 5-325 MG tablet Commonly known as: Norco Take 1-2 tablets by mouth every 6 (six) hours as needed for moderate pain or severe pain.   loratadine 10 MG tablet Commonly known as: CLARITIN Take 1 tablet (10 mg total) by mouth daily. What changed: when to take this   multivitamin Tabs tablet Take 1 tablet by mouth at bedtime.   ondansetron 4 MG disintegrating tablet Commonly known as: ZOFRAN-ODT Take 4 mg by mouth every 8 (eight) hours  as needed for nausea.   predniSONE 20 MG tablet Commonly known as: DELTASONE Take 2 tablets (40 mg total) by mouth daily with breakfast for 3 days.   Sensipar 30 MG tablet Generic drug: cinacalcet Take 30 mg by mouth daily with supper.   sevelamer carbonate 800 MG tablet Commonly known as: RENVELA Take 800-2,400 mg by mouth See admin instructions. Take 3 tablets (2400mg ) by mouth three times daily before meals and take 1 tablet (800mg ) by mouth once daily with a snack   Spiriva HandiHaler 18 MCG inhalation capsule Generic drug: tiotropium Place 18 mcg into inhaler and inhale daily.   Trelegy Ellipta 100-62.5-25 MCG/INH Aepb Generic drug: Fluticasone-Umeclidin-Vilant Inhale 1 puff into the lungs daily. What changed: when to take this   zolpidem 10 MG tablet Commonly known as: AMBIEN Take 10 mg by mouth at bedtime as needed for sleep.      Follow-up Information    Cyndia Skeeters  M, FNP. Schedule an appointment as soon as possible for a visit.   Specialty: Family Medicine Contact information: [address] [place] [address] [phone]        Wellington Hampshire, MD .   Specialty: Cardiology Contact information: [address] [place] [address] [phone]          Allergies  Allergen Reactions  . Lisinopril Hives    Consultations:  Nephrology  Procedures/Studies: DG Chest 2 View  Result Date: 01/16/2020 CLINICAL DATA:  Shortness of breath EXAM: CHEST - 2 VIEW COMPARISON:  December 18, 2019 FINDINGS: Central catheter tip is at the cavoatrial junction. No pneumothorax. There are scattered areas of interstitial thickening with scarring in the left base, stable. No new opacity evident. Heart size and pulmonary vascularity are normal. No adenopathy. Stent noted in left subclavian region. Status post coronary artery bypass grafting. There is aortic atherosclerosis. No adenopathy. No bone lesions. IMPRESSION: Central catheter tip at cavoatrial  junction. No pneumothorax. Areas of interstitial thickening bilaterally are stable as is scarring in the left base. No edema or airspace opacity. Stable cardiac silhouette. Postoperative changes noted. Aortic Atherosclerosis (ICD10-I70.0). Electronically Signed   By: Lowella Grip III M.D.   On: 01/16/2020 11:06   CT HEAD WO CONTRAST  Result Date: 01/16/2020 CLINICAL DATA:  Headache. Head trauma. EXAM: CT HEAD WITHOUT CONTRAST TECHNIQUE: Contiguous axial images were obtained from the base of the skull through the vertex without intravenous contrast. COMPARISON:  12/18/2019 FINDINGS: Brain: No evidence of acute infarction, hemorrhage, hydrocephalus, extra-axial collection or mass lesion/mass effect. Atrophy and chronic microvascular ischemic changes are again noted. There is an old left thalamic lacunar infarct Vascular: No hyperdense vessel or unexpected calcification. Skull: Normal. Negative for fracture or focal lesion. Sinuses/Orbits: There is new opacification of the right maxillary sinus. There is mild opacification of the right frontal sinus. The remaining paranasal sinuses and mastoid air cells are essentially clear. Other: None. IMPRESSION: 1. No acute intracranial abnormality. 2. Atrophy and chronic microvascular ischemic changes. Electronically Signed   By: Constance Holster M.D.   On: 01/16/2020 15:09   NM PULMONARY VENT AND PERF (V/Q Scan)  Result Date: 01/16/2020 CLINICAL DATA:  Right chest pain during dialysis today, shortness of breath EXAM: NUCLEAR MEDICINE VENTILATION - PERFUSION LUNG SCAN TECHNIQUE: Ventilation images were obtained in multiple projections using inhaled aerosol Tc-70m DTPA. Perfusion images were obtained in multiple projections after intravenous injection of Tc-7m MAA. RADIOPHARMACEUTICALS:  32.82 mCi of Tc-25m DTPA aerosol inhalation and 5.32 mCi Tc74m MAA IV COMPARISON:  01/16/2020, 09/24/2019 FINDINGS: Ventilation: Deposition of radiotracer within the central airways.  No segmental ventilation defects. Ingested radiotracer within the esophagus and proximal gastrointestinal tract. Perfusion: There are no significant perfusion defects. No segmental or subsegmental perfusion defects are identified. IMPRESSION: 1. Low probability study for pulmonary embolus. Electronically Signed   By: Randa Ngo M.D.   On: 01/16/2020 15:09   VAS US DUPLEX DIALYSIS ACCESS (AVF, AVG)  Result Date: 01/03/2020 DIALYSIS ACCESS Access Site: Left Upper Extremity. Access Type: Brachial-cephalic AVF. History: 12/07/2018: New Artegraft with Ligation of the Previous Aneurysmal AVF          in Forearm.          02/01/2019: Jump Graft Anastomosis and PTA's.           11/30/2019: New Left Brachial Cephalic AVF placement. Performing Technologist: Almira Coaster RVS  Examination Guidelines: A complete evaluation includes B-mode imaging, spectral Doppler, color Doppler, and power  Doppler as needed of all accessible portions of each vessel. Unilateral testing is considered an integral part of a complete examination. Limited examinations for reoccurring indications may be performed as noted.  Findings: +--------------------+----------+-----------------+--------+ AVF                 PSV (cm/s)Flow Vol (mL/min)Comments +--------------------+----------+-----------------+--------+ Native artery inflow   449          1365                +--------------------+----------+-----------------+--------+ AVF Anastomosis        491                              +--------------------+----------+-----------------+--------+  +---------------+----------+-------------+----------+--------+ OUTFLOW VEIN   PSV (cm/s)Diameter (cm)Depth (cm)Describe +---------------+----------+-------------+----------+--------+ Subclavian vein   169                                    +---------------+----------+-------------+----------+--------+ Confluence        221                                     +---------------+----------+-------------+----------+--------+ Clavicle          225                                    +---------------+----------+-------------+----------+--------+ Shoulder          217                                    +---------------+----------+-------------+----------+--------+ Prox UA           336                                    +---------------+----------+-------------+----------+--------+ Mid UA            213                                    +---------------+----------+-------------+----------+--------+ Dist UA           458                                    +---------------+----------+-------------+----------+--------+  +--------------+-------------+---------+---------+----------+------------------+               Diameter (cm)  Depth  BranchingPSV (cm/s)   Flow Volume                                  (cm)                           (ml/min)      +--------------+-------------+---------+---------+----------+------------------+ Left Rad Art  76                       Dist                                                                      +--------------+-------------+---------+---------+----------+------------------+  Summary: The Left Brachial Cephalic AVF appears to be patent, Normal Flow Volume seen. There appears to be a Stricture measuring .26cms, AVF measures .84cms beyond that point. There appears to be a Hematoma seen in the Left Distal Upper Arm; The Origin of the Left AVF. Compressed the Left AVF While monitoring the Left Radial Artery at the level of the Wrist and Velocities decreased.  *See table(s) above for measurements and observations.  Diagnosing physician: Hortencia Pilar MD Electronically signed by Hortencia Pilar MD on 01/03/2020 at 9:08:35 AM.   --------------------------------------------------------------------------------   Final      Subjective: Patient was  feeling better when seen today.  He was ready to go home after getting dialysis.  Discharge Exam: Vitals:   01/18/20 1100 01/18/20 1115  BP: 135/70   Pulse: 78   Resp: 15   Temp:    SpO2: 91% 97%   Vitals:   01/18/20 1030 01/18/20 1045 01/18/20 1100 01/18/20 1115  BP: (!) 146/77 140/74 135/70   Pulse: 80 88 78   Resp: 15 13 15    Temp:      TempSrc:      SpO2: 94% 100% 91% 97%  Weight:      Height:        General: Pt is alert, awake, not in acute distress Cardiovascular: RRR, S1/S2 +, no rubs, no gallops Respiratory: CTA bilaterally, no wheezing, no rhonchi Abdominal: Soft, NT, ND, bowel sounds + Extremities: no edema, no cyanosis   The results of significant diagnostics from this hospitalization (including imaging, microbiology, ancillary and laboratory) are listed below for reference.    Microbiology: Recent Results (from the past 240 hour(s))  Respiratory Panel by RT PCR (Flu A&B, Covid) - Nasopharyngeal Swab     Status: None   Collection Time: 01/16/20 12:39 PM   Specimen: Nasopharyngeal Swab  Result Value Ref Range Status   SARS Coronavirus 2 by RT PCR NEGATIVE NEGATIVE Final    Comment: (NOTE) SARS-CoV-2 target nucleic acids are NOT DETECTED. The SARS-CoV-2 RNA is generally detectable in upper respiratoy specimens during the acute phase of infection. The lowest concentration of SARS-CoV-2 viral copies this assay can detect is 131 copies/mL. A negative result does not preclude SARS-Cov-2 infection and should not be used as the sole basis for treatment or other patient management decisions. A negative result may occur with  improper specimen collection/handling, submission of specimen other than nasopharyngeal swab, presence of viral mutation(s) within the areas targeted by this assay, and inadequate number of viral copies (<131 copies/mL). A negative result must be combined with clinical observations, patient history, and epidemiological information.  The expected result is Negative. Fact Sheet for Patients:  PinkCheek.be Fact Sheet for Healthcare Providers:  GravelBags.it This test is not yet ap proved or cleared by the [place] FDA and  has been authorized for detection and/or diagnosis of SARS-CoV-2 by FDA under an Emergency Use Authorization (EUA). This EUA will remain  in  effect (meaning this test can be used) for the duration of the COVID-19 declaration under Section 564(b)(1) of the Act, 21 U.S.C. section 360bbb-3(b)(1), unless the authorization is terminated or revoked sooner.    Influenza A by PCR NEGATIVE NEGATIVE Final   Influenza B by PCR NEGATIVE NEGATIVE Final    Comment: (NOTE) The Xpert Xpress SARS-CoV-2/FLU/RSV assay is intended as an aid in  the diagnosis of influenza from Nasopharyngeal swab specimens and  should not be used as a sole basis for treatment. Nasal washings and  aspirates are unacceptable for Xpert Xpress SARS-CoV-2/FLU/RSV  testing. Fact Sheet for Patients: PinkCheek.be Fact Sheet for Healthcare Providers: GravelBags.it This test is not yet approved or cleared by the [place] FDA and  has been authorized for detection and/or diagnosis of SARS-CoV-2 by  FDA under an Emergency Use Authorization (EUA). This EUA will remain  in effect (meaning this test can be used) for the duration of the  Covid-19 declaration under Section 564(b)(1) of the Act, 21  U.S.C. section 360bbb-3(b)(1), unless the authorization is  terminated or revoked. Performed at [facility], [address] [place], [place] [address]   Culture, sputum-assessment     Status: None   Collection Time: 01/17/20  9:45 AM   Specimen: Expectorated Sputum  Result Value Ref Range Status   Specimen Description EXPECTORATED SPUTUM  Final   Special Requests NONE  Final   Sputum evaluation   Final    THIS  SPECIMEN IS ACCEPTABLE FOR SPUTUM CULTURE Performed at [facility], [address], [place], [place] [address]    Report Status 01/17/2020 FINAL  Final  Culture, respiratory     Status: None (Preliminary result)   Collection Time: 01/17/20  9:45 AM  Result Value Ref Range Status   Specimen Description   Final    EXPECTORATED SPUTUM Performed at [facility], [address], [place], [place] [address]    Special Requests   Final    NONE Reflexed from [id number] Performed at [facility], [address] [place], [place] [address]    Gram Stain   Final    NO WBC SEEN MODERATE SQUAMOUS EPITHELIAL CELLS PRESENT MODERATE YEAST MODERATE GRAM POSITIVE RODS FEW GRAM POSITIVE COCCI    Culture   Final    CULTURE REINCUBATED FOR BETTER GROWTH Performed at [facility], [place] [address], [place],  [address]    Report Status PENDING  Incomplete     Labs: BNP (last 3 results) Recent Labs    05/28/19 0709 01/16/20 1012  BNP >4,500.0* 347.4*   Basic Metabolic Panel: Recent Labs  Lab 01/16/20 1012 01/17/20 0426  NA 134* 134*  K 3.0* 4.5  CL 96* 96*  CO2 25 25  GLUCOSE 92 136*  BUN 18 36*  CREATININE 3.68* 5.78*  CALCIUM 8.5* 9.2  MG  --  2.3   Liver Function Tests: No results for input(s): AST, ALT, ALKPHOS, BILITOT, PROT, ALBUMIN in the last 168 hours. No results for input(s): LIPASE, AMYLASE in the last 168 hours. No results for input(s): AMMONIA in the last 168 hours. CBC: Recent Labs  Lab 01/16/20 1012 01/17/20 0426  WBC 15.7* 23.5*  NEUTROABS 11.4*  --   HGB 9.4* 10.1*  HCT 27.8* 28.6*  MCV 107.8* 105.1*  PLT 449* 485*   Cardiac Enzymes: No results for input(s): CKTOTAL, CKMB, CKMBINDEX, TROPONINI in the last 168 hours. BNP: Invalid input(s): POCBNP CBG: No results for input(s): GLUCAP in the last  168 hours. D-Dimer No results for input(s): DDIMER in the last 72 hours. Hgb A1c No results for input(s): HGBA1C in the  last 72 hours. Lipid Profile No results for input(s): CHOL, HDL, LDLCALC, TRIG, CHOLHDL, LDLDIRECT in the last 72 hours. Thyroid function studies No results for input(s): TSH, T4TOTAL, T3FREE, THYROIDAB in the last 72 hours.  Invalid input(s): FREET3 Anemia work up No results for input(s): VITAMINB12, FOLATE, FERRITIN, TIBC, IRON, RETICCTPCT in the last 72 hours. Urinalysis    Component Value Date/Time   COLORURINE Amber 01/12/2014 0513   COLORURINE YELLOW 11/27/2013 1309   APPEARANCEUR Cloudy 01/12/2014 0513   LABSPEC 1.028 01/12/2014 0513   PHURINE 5.0 01/12/2014 0513   PHURINE 7.5 11/27/2013 1309   GLUCOSEU 50 mg/dL 01/12/2014 0513   HGBUR 1+ 01/12/2014 0513   HGBUR SMALL (A) 11/27/2013 1309   BILIRUBINUR Negative 01/12/2014 0513   KETONESUR Trace 01/12/2014 0513   KETONESUR NEGATIVE 11/27/2013 1309   PROTEINUR >=500 01/12/2014 0513   PROTEINUR >300 (A) 11/27/2013 1309   UROBILINOGEN 0.2 11/27/2013 1309   NITRITE Negative 01/12/2014 0513   NITRITE NEGATIVE 11/27/2013 1309   LEUKOCYTESUR 3+ 01/12/2014 0513   Sepsis Labs Invalid input(s): PROCALCITONIN,  WBC,  LACTICIDVEN Microbiology Recent Results (from the past 240 hour(s))  Respiratory Panel by RT PCR (Flu A&B, Covid) - Nasopharyngeal Swab     Status: None   Collection Time: 01/16/20 12:39 PM   Specimen: Nasopharyngeal Swab  Result Value Ref Range Status   SARS Coronavirus 2 by RT PCR NEGATIVE NEGATIVE Final    Comment: (NOTE) SARS-CoV-2 target nucleic acids are NOT DETECTED. The SARS-CoV-2 RNA is generally detectable in upper respiratoy specimens during the acute phase of infection. The lowest concentration of SARS-CoV-2 viral copies this assay can detect is 131 copies/mL. A negative result does not preclude SARS-Cov-2 infection and should not be used as the sole basis for treatment or other patient management decisions. A negative result may occur with  improper specimen collection/handling, submission of  specimen other than nasopharyngeal swab, presence of viral mutation(s) within the areas targeted by this assay, and inadequate number of viral copies (<131 copies/mL). A negative result must be combined with clinical observations, patient history, and epidemiological information. The expected result is Negative. Fact Sheet for Patients:  PinkCheek.be Fact Sheet for Healthcare Providers:  GravelBags.it This test is not yet ap proved or cleared by the [place] FDA and  has been authorized for detection and/or diagnosis of SARS-CoV-2 by FDA under an Emergency Use Authorization (EUA). This EUA will remain  in effect (meaning this test can be used) for the duration of the COVID-19 declaration under Section 564(b)(1) of the Act, 21 U.S.C. section 360bbb-3(b)(1), unless the authorization is terminated or revoked sooner.    Influenza A by PCR NEGATIVE NEGATIVE Final   Influenza B by PCR NEGATIVE NEGATIVE Final    Comment: (NOTE) The Xpert Xpress SARS-CoV-2/FLU/RSV assay is intended as an aid in  the diagnosis of influenza from Nasopharyngeal swab specimens and  should not be used as a sole basis for treatment. Nasal washings and  aspirates are unacceptable for Xpert Xpress SARS-CoV-2/FLU/RSV  testing. Fact Sheet for Patients: PinkCheek.be Fact Sheet for Healthcare Providers: GravelBags.it This test is not yet approved or cleared by the [place] FDA and  has been authorized for detection and/or diagnosis of SARS-CoV-2 by  FDA under an Emergency Use Authorization (EUA). This EUA will remain  in effect (meaning this test can be used) for the duration  of the  Covid-19 declaration under Section 564(b)(1) of the Act, 21  U.S.C. section 360bbb-3(b)(1), unless the authorization is  terminated or revoked. Performed at [facility], [address]  [place], [place] [address]   Culture, sputum-assessment     Status: None   Collection Time: 01/17/20  9:45 AM   Specimen: Expectorated Sputum  Result Value Ref Range Status   Specimen Description EXPECTORATED SPUTUM  Final   Special Requests NONE  Final   Sputum evaluation   Final    THIS SPECIMEN IS ACCEPTABLE FOR SPUTUM CULTURE Performed at [facility], [address] [place], [place] [address]    Report Status 01/17/2020 FINAL  Final  Culture, respiratory     Status: None (Preliminary result)   Collection Time: 01/17/20  9:45 AM  Result Value Ref Range Status   Specimen Description   Final    EXPECTORATED SPUTUM Performed at [facility], [address] [place], [place] [address]    Special Requests   Final    NONE Reflexed from [id number] Performed at [facility], [address] [place], [place] [address]    Gram Stain   Final    NO WBC SEEN MODERATE SQUAMOUS EPITHELIAL CELLS PRESENT MODERATE YEAST MODERATE GRAM POSITIVE RODS FEW GRAM POSITIVE COCCI    Culture   Final    CULTURE REINCUBATED FOR BETTER GROWTH Performed at [facility], [place] [address], [place], [place] [address]    Report Status PENDING  Incomplete    Time coordinating discharge: Over 30 minutes  SIGNED:  Lorella Nimrod, MD  [facility] 01/18/2020, 11:30 AM  If 7PM-7AM, please contact night-coverage [email or web address]  This record has been created using Systems analyst. Errors have been sought and corrected,but may not always be located. Such creation errors do not reflect on the standard of care.

## 2020-01-18 NOTE — Progress Notes (Signed)
POST HD 

## 2020-01-18 NOTE — Progress Notes (Signed)
Discharge instructions explained to pt / verbalized an understanding/ iv and tele removed/ will transport off unit via wheelchair when ride arrives.  

## 2020-01-18 NOTE — Progress Notes (Signed)
Hemodialysis patient known at [facility]) MWF 5:45am. Patient normally transports self. Please contact me with any dialysis placement concerns.  Elvera Bicker Dialysis Coordinator [phone]

## 2020-01-18 NOTE — Progress Notes (Signed)
PRE HD   

## 2020-01-18 NOTE — Progress Notes (Signed)
This note also relates to the following rows which could not be included: Pulse Rate - Cannot attach notes to unvalidated device data Resp - Cannot attach notes to unvalidated device data SpO2 - Cannot attach notes to unvalidated device data

## 2020-01-19 LAB — CULTURE, RESPIRATORY W GRAM STAIN
Culture: NORMAL
Gram Stain: NONE SEEN

## 2020-01-21 DIAGNOSIS — N2581 Secondary hyperparathyroidism of renal origin: Secondary | ICD-10-CM | POA: Diagnosis not present

## 2020-01-21 DIAGNOSIS — D631 Anemia in chronic kidney disease: Secondary | ICD-10-CM | POA: Diagnosis not present

## 2020-01-21 DIAGNOSIS — D509 Iron deficiency anemia, unspecified: Secondary | ICD-10-CM | POA: Diagnosis not present

## 2020-01-21 DIAGNOSIS — N186 End stage renal disease: Secondary | ICD-10-CM | POA: Diagnosis not present

## 2020-01-21 DIAGNOSIS — I132 Hypertensive heart and chronic kidney disease with heart failure and with stage 5 chronic kidney disease, or end stage renal disease: Secondary | ICD-10-CM | POA: Diagnosis not present

## 2020-01-21 DIAGNOSIS — J449 Chronic obstructive pulmonary disease, unspecified: Secondary | ICD-10-CM | POA: Diagnosis not present

## 2020-01-21 DIAGNOSIS — E8779 Other fluid overload: Secondary | ICD-10-CM | POA: Diagnosis not present

## 2020-01-21 DIAGNOSIS — I5032 Chronic diastolic (congestive) heart failure: Secondary | ICD-10-CM | POA: Diagnosis not present

## 2020-01-21 DIAGNOSIS — Z992 Dependence on renal dialysis: Secondary | ICD-10-CM | POA: Diagnosis not present

## 2020-01-22 ENCOUNTER — Inpatient Hospital Stay: Payer: Medicare Other | Admitting: Family Medicine

## 2020-01-23 DIAGNOSIS — D509 Iron deficiency anemia, unspecified: Secondary | ICD-10-CM | POA: Diagnosis not present

## 2020-01-23 DIAGNOSIS — N186 End stage renal disease: Secondary | ICD-10-CM | POA: Diagnosis not present

## 2020-01-23 DIAGNOSIS — E8779 Other fluid overload: Secondary | ICD-10-CM | POA: Diagnosis not present

## 2020-01-23 DIAGNOSIS — D631 Anemia in chronic kidney disease: Secondary | ICD-10-CM | POA: Diagnosis not present

## 2020-01-23 DIAGNOSIS — N2581 Secondary hyperparathyroidism of renal origin: Secondary | ICD-10-CM | POA: Diagnosis not present

## 2020-01-23 DIAGNOSIS — Z992 Dependence on renal dialysis: Secondary | ICD-10-CM | POA: Diagnosis not present

## 2020-01-25 ENCOUNTER — Ambulatory Visit: Payer: Medicare Other | Admitting: Family

## 2020-01-25 DIAGNOSIS — I132 Hypertensive heart and chronic kidney disease with heart failure and with stage 5 chronic kidney disease, or end stage renal disease: Secondary | ICD-10-CM | POA: Diagnosis not present

## 2020-01-25 DIAGNOSIS — N2581 Secondary hyperparathyroidism of renal origin: Secondary | ICD-10-CM | POA: Diagnosis not present

## 2020-01-25 DIAGNOSIS — E8779 Other fluid overload: Secondary | ICD-10-CM | POA: Diagnosis not present

## 2020-01-25 DIAGNOSIS — D631 Anemia in chronic kidney disease: Secondary | ICD-10-CM | POA: Diagnosis not present

## 2020-01-25 DIAGNOSIS — D509 Iron deficiency anemia, unspecified: Secondary | ICD-10-CM | POA: Diagnosis not present

## 2020-01-25 DIAGNOSIS — Z992 Dependence on renal dialysis: Secondary | ICD-10-CM | POA: Diagnosis not present

## 2020-01-25 DIAGNOSIS — N186 End stage renal disease: Secondary | ICD-10-CM | POA: Diagnosis not present

## 2020-01-25 DIAGNOSIS — I5032 Chronic diastolic (congestive) heart failure: Secondary | ICD-10-CM | POA: Diagnosis not present

## 2020-01-25 DIAGNOSIS — J449 Chronic obstructive pulmonary disease, unspecified: Secondary | ICD-10-CM | POA: Diagnosis not present

## 2020-01-25 NOTE — Progress Notes (Deleted)
Office Visit    Patient Name: Daryl Weeks Date of Encounter: 01/25/2020  Primary Care Provider:  Verl Bangs, FNP Primary Cardiologist:  Kathlyn Sacramento, MD Electrophysiologist:  None   Chief Complaint    Daryl Weeks is a 59 y.o. male with a hx of CAD s/p CABG 11/2013, ESRD on HD, chronic diastolic heart failure, COPD, atrial fibrillation, prior CVA, sleep apnea, HTN, HLD, tobacco/EtOH use presents today for follow-up after hospitalization  Past Medical History    Past Medical History:  Diagnosis Date  . (HFpEF) heart failure with preserved ejection fraction ([place])    a. 05/2019 Echo: EF 50-55%, diast dysfxn, RVSP 56.51mmHg, Sev dil LA. Mildly dil PA.  [name] Anemia   . Anxiety   . Arthritis   . Colon polyps   . COPD (chronic obstructive pulmonary disease) ([place])   . Coronary artery disease    a. 10/2013 NSTEMI/Cath: Severe 3VD-->CABG x 4 @ [facility] 01/2014 (LIMA->LAD, VG->OM1->OM2, VG->RCA); b. 11/2014 MV: No isch/infarct; c. 05/2019 MV: EF 40% (50-55 by echo), small,mild, fixed apical defect - ? atten vs infarct. No ischemia->low risk.  . ESRD (end stage renal disease) ([place])    a. MWFSat HD  . ETOH abuse    a. 2 beers/night.  [name] GERD (gastroesophageal reflux disease)   . History of pneumonia   . Hyperlipidemia   . Hypertension   . IgA nephropathy   . Lower GI bleed    a. Due to colon polyps. Status post resection of 14 polyps  . Non-ST elevation MI (NSTEMI) ([place]) 2016  . PAF (paroxysmal atrial fibrillation) ([place])    a. Dx 05/2019. CHA2DS2VASc = 3-->Eliquis/Amio.  . Pneumonia 08/2019  . Rheumatoid arthritis ([place])   . Shortness of breath    WITH EXERTION  . Sleep apnea   . Tobacco abuse    Past Surgical History:  Procedure Laterality Date  . A/V FISTULAGRAM Left 10/28/2016   Procedure: A/V Fistulagram;  Surgeon: [name], MD;  Location: [place] [facility];  Service: Cardiovascular;  Laterality: Left;  . A/V FISTULAGRAM Left 12/07/2018   Procedure: A/V FISTULAGRAM;   Surgeon: [name], MD;  Location: [place] [facility];  Service: Cardiovascular;  Laterality: Left;  . A/V FISTULAGRAM N/A 06/04/2019   Procedure: A/V Fistulagram- LEFT;  Surgeon: [name], MD;  Location: [place] [facility];  Service: Cardiovascular;  Laterality: N/A;  . A/V SHUNT INTERVENTION N/A 10/28/2016   Procedure: A/V Shunt Intervention;  Surgeon: [name], MD;  Location: [place] [facility];  Service: Cardiovascular;  Laterality: N/A;  . A/V SHUNTOGRAM Left 02/01/2019   Procedure: A/V SHUNTOGRAM;  Surgeon: [name], MD;  Location: [place] [facility];  Service: Cardiovascular;  Laterality: Left;  . AV FISTULA PLACEMENT    . AV FISTULA PLACEMENT Left 11/30/2019   Procedure: ARTERIOVENOUS (AV) FISTULA CREATION ( BRACHIAL CEPHALIC );  Surgeon: Katha Cabal, MD;  Location: [facility];  Service: Vascular;  Laterality: Left;  . CARDIAC CATHETERIZATION     RCA 90% and calcified mid LAD 80% Stenosis  . CORONARY ARTERY BYPASS GRAFT N/A 11/29/2013   Procedure: CORONARY ARTERY BYPASS GRAFTING (CABG) x 4 using endoscopically harvested right saphenous vein and left internal mammary artery;  Surgeon: [name], MD;  Location: [place] [facility];  Service: Open Heart Surgery;  Laterality: N/A;  . dialysis catheterr  2/15  . DIALYSIS/PERMA CATHETER INSERTION N/A 07/16/2019   Procedure: DIALYSIS/PERMA CATHETER INSERTION WITH VAC  CHANGE UNDER SEDATION;  Surgeon: [name], MD;  Location: [place] [facility];  Service: Cardiovascular;  Laterality: N/A;  . DIALYSIS/PERMA CATHETER INSERTION N/A 11/08/2019   Procedure: DIALYSIS/PERMA CATHETER INSERTION;  Surgeon: [name], MD;  Location: [place] [facility];  Service: Cardiovascular;  Laterality: N/A;  . DIALYSIS/PERMA CATHETER REMOVAL N/A 02/15/2019   Procedure: DIALYSIS/PERMA CATHETER REMOVAL;  Surgeon: [name], MD;  Location: [place] [facility];  Service: Cardiovascular;  Laterality: N/A;  . INSERTION OF DIALYSIS CATHETER N/A  12/14/2018   Procedure: INSERTION OF DIALYSIS CATHETER ( [place] );  Surgeon: [name], MD;  Location: [facility];  Service: Vascular;  Laterality: N/A;  . INTRAOPERATIVE TRANSESOPHAGEAL ECHOCARDIOGRAM N/A 11/29/2013   Procedure: INTRAOPERATIVE TRANSESOPHAGEAL ECHOCARDIOGRAM;  Surgeon: [name], MD;  Location: [place] [facility];  Service: Open Heart Surgery;  Laterality: N/A;  . LIGATION OF ARTERIOVENOUS  FISTULA Left 07/13/2019   Procedure: LIGATION OF ARTERIOVENOUS  FISTULA;  Surgeon: [name], MD;  Location: [facility];  Service: Vascular;  Laterality: Left;  . PERIPHERAL VASCULAR CATHETERIZATION N/A 06/12/2015   Procedure: A/V Shuntogram/Fistulagram;  Surgeon: [name], MD;  Location: [place] [facility];  Service: Cardiovascular;  Laterality: N/A;  . PERIPHERAL VASCULAR CATHETERIZATION Left 06/12/2015   Procedure: A/V Shunt Intervention;  Surgeon: [name], MD;  Location: [place] [facility];  Service: Cardiovascular;  Laterality: Left;  . RENAL BIOPSY Left 14  . REVISON OF ARTERIOVENOUS FISTULA Left 12/14/2018   Procedure: REVISON OF ARTERIOVENOUS FISTULA;  Surgeon: [name], MD;  Location: [facility];  Service: Vascular;  Laterality: Left;  . UPPER EXTREMITY VENOGRAPHY Bilateral 09/18/2019   Procedure: UPPER EXTREMITY VENOGRAPHY;  Surgeon: Katha Cabal, MD;  Location: [place] [facility];  Service: Cardiovascular;  Laterality: Bilateral;    Allergies  Allergies  Allergen Reactions  . Lisinopril Hives    History of Present Illness    Daryl Weeks is a 59 y.o. male with a hx of CAD s/p CABG 11/2013, ESRD on HD, chronic diastolic heart failure, COPD, atrial fibrillation, prior CVA, sleep apnea, HTN, HLD, tobacco/EtOH use.  He was last seen ***.  Cardiac work-up March 2016 preparation of kidney transplant.  Nuclear stress test normal.  Echo with normal LV systolic function with mildly dilated LA.  He has previously been on the transplant list but was taken off and continued  tobacco use.  Hospitalized edema 20 transvascular chest pain, shortness of breath in addition of slurred speech.  MRI of the brain with no acute infarct but did show chronic lacunar infarcts within the left frontal lobe and left basal ganglia.  He developed atrial fibrillation during dialysis.  Echo with LVEF 50-55%, RV dysfunction moderately reduced with moderate pulmonary hypertension.  [place] with no ischemia.  Hospitalized again in January 2021 with hemoptysis and hyperkalemia.  Eliquis held briefly.  A clinic visit with Dr. Velva Harman 10/2019 his Eliquis and amiodarone were stopped given his atrial fibrillation with bradycardia in the setting of acute illness.  Hospitalized 12/2019 with syncopal episode after standing up too quickly.  It was thought to be due to orthostatic hypotension.  He had no arrhythmia noted on telemetry.  He was put back on amiodarone as well as Eliquis 2.5 mg once daily.  Noted that it may be to prevent clotting and dialysis access catheters as he been having difficulty.  A clinic visit 01/03/2020 his amiodarone was discontinued as he had single episode of atrial  fibrillation in the setting of acute illness.  He was readmitted 01/16/2020-01/18/20 after presenting with shortness of breath and hypoxia.  He was admitted for COPD exacerbation.  He was discharged on antibiotics and prednisone.  His amlodipine was discontinued per nephrology due to orthostasis.  There was concern for fall and CT were without acute abnormalities.  Home health PT was arranged.  ***  EKGs/Labs/Other Studies Reviewed:   The following studies were reviewed today: ***  EKG:  EKG is *** ordered today.  The ekg ordered today demonstrates ***  Recent Labs: 10/09/2019: TSH 1.230 12/18/2019: ALT 9 01/16/2020: B Natriuretic Peptide 561.0 01/17/2020: BUN 36; Creatinine, Ser 5.78; Hemoglobin 10.1; Magnesium 2.3; Platelets 485; Potassium 4.5; Sodium 134  Recent Lipid Panel    Component Value Date/Time   CHOL  137 05/29/2019 0340   CHOL 143 10/30/2013 0543   TRIG 55 05/29/2019 0340   TRIG 62 10/30/2013 0543   HDL 72 05/29/2019 0340   HDL 65 (H) 10/30/2013 0543   CHOLHDL 1.9 05/29/2019 0340   VLDL 11 05/29/2019 0340   VLDL 12 10/30/2013 0543   LDLCALC 54 05/29/2019 0340   LDLCALC 28 11/15/2017 0850   LDLCALC 66 10/30/2013 0543    Home Medications   No outpatient medications have been marked as taking for the 01/25/20 encounter (Appointment) with Loel Dubonnet, NP.      Review of Systems    ***   ROS All other systems reviewed and are otherwise negative except as noted above.  Physical Exam    VS:  There were no vitals taken for this visit. , BMI There is no height or weight on file to calculate BMI. GEN: Well nourished, well developed, in no acute distress. HEENT: normal. Neck: Supple, no JVD, carotid bruits, or masses. Cardiac: ***RRR, no murmurs, rubs, or gallops. No clubbing, cyanosis, edema.  ***Radials/DP/PT 2+ and equal bilaterally.  Respiratory:  ***Respirations regular and unlabored, clear to auscultation bilaterally. GI: Soft, nontender, nondistended, BS + x 4. MS: No deformity or atrophy. Skin: Warm and dry, no rash. Neuro:  Strength and sensation are intact. Psych: Normal affect.  Accessory Clinical Findings    ECG personally reviewed by me today - *** - no acute changes.  Assessment & Plan    1. CAD s/p CABG -  2. Chronic diastolic heart failure -fluid status managed with dialysis. 3. PAF -isolated episode in setting of acute illness.  Given anemia and recurrent bleeding anticoagulation has been stopped from her primary cardiologist. 4. HLD, LDL goal less than 70 -05/2019 LDL 94.  Continue atorvastatin 40 mg daily. 5. HTN -  6. Tobacco abuse - Smoking cessation encouraged. Recommend utilization of [id number]  Disposition: Follow up {follow up:15908} with Dr. Fletcher Anon or APP  Loel Dubonnet, NP 01/25/2020, 12:40 PM

## 2020-01-26 DIAGNOSIS — D631 Anemia in chronic kidney disease: Secondary | ICD-10-CM | POA: Diagnosis not present

## 2020-01-26 DIAGNOSIS — Z992 Dependence on renal dialysis: Secondary | ICD-10-CM | POA: Diagnosis not present

## 2020-01-26 DIAGNOSIS — N2581 Secondary hyperparathyroidism of renal origin: Secondary | ICD-10-CM | POA: Diagnosis not present

## 2020-01-26 DIAGNOSIS — E8779 Other fluid overload: Secondary | ICD-10-CM | POA: Diagnosis not present

## 2020-01-26 DIAGNOSIS — D509 Iron deficiency anemia, unspecified: Secondary | ICD-10-CM | POA: Diagnosis not present

## 2020-01-26 DIAGNOSIS — N186 End stage renal disease: Secondary | ICD-10-CM | POA: Diagnosis not present

## 2020-01-28 ENCOUNTER — Telehealth (INDEPENDENT_AMBULATORY_CARE_PROVIDER_SITE_OTHER): Payer: Self-pay

## 2020-01-28 ENCOUNTER — Encounter: Payer: Self-pay | Admitting: Family

## 2020-01-28 ENCOUNTER — Encounter (INDEPENDENT_AMBULATORY_CARE_PROVIDER_SITE_OTHER): Payer: Self-pay | Admitting: Nurse Practitioner

## 2020-01-28 ENCOUNTER — Ambulatory Visit (INDEPENDENT_AMBULATORY_CARE_PROVIDER_SITE_OTHER): Payer: Medicare Other

## 2020-01-28 ENCOUNTER — Ambulatory Visit (INDEPENDENT_AMBULATORY_CARE_PROVIDER_SITE_OTHER): Payer: Medicare Other | Admitting: Nurse Practitioner

## 2020-01-28 ENCOUNTER — Other Ambulatory Visit: Payer: Self-pay

## 2020-01-28 VITALS — BP 163/83 | HR 87 | Resp 16 | Wt 164.6 lb

## 2020-01-28 DIAGNOSIS — I10 Essential (primary) hypertension: Secondary | ICD-10-CM

## 2020-01-28 DIAGNOSIS — N186 End stage renal disease: Secondary | ICD-10-CM

## 2020-01-28 DIAGNOSIS — T829XXD Unspecified complication of cardiac and vascular prosthetic device, implant and graft, subsequent encounter: Secondary | ICD-10-CM

## 2020-01-28 DIAGNOSIS — Z992 Dependence on renal dialysis: Secondary | ICD-10-CM | POA: Diagnosis not present

## 2020-01-28 DIAGNOSIS — D509 Iron deficiency anemia, unspecified: Secondary | ICD-10-CM | POA: Diagnosis not present

## 2020-01-28 DIAGNOSIS — N2581 Secondary hyperparathyroidism of renal origin: Secondary | ICD-10-CM | POA: Diagnosis not present

## 2020-01-28 DIAGNOSIS — D631 Anemia in chronic kidney disease: Secondary | ICD-10-CM | POA: Diagnosis not present

## 2020-01-28 DIAGNOSIS — E8779 Other fluid overload: Secondary | ICD-10-CM | POA: Diagnosis not present

## 2020-01-28 DIAGNOSIS — E785 Hyperlipidemia, unspecified: Secondary | ICD-10-CM

## 2020-01-28 NOTE — Telephone Encounter (Signed)
Spoke with the patient and he is now scheduled with Dr. Lucky Cowboy for a LUE angio on 02/07/20 with a 9:15 am arrival time to the [facility]. Patient will do covid testing on 02/05/20 between 8-1 pm at the [facility]. Pre-procedure instructions were discussed and will be mailed to the patient .

## 2020-01-28 NOTE — Progress Notes (Signed)
Subjective:    Patient ID: Daryl Weeks, male    DOB: 11-Jan-1961, 59 y.o.   MRN: [id number] Chief Complaint  Patient presents with  . Follow-up    ultrasound follow up    Patient returns today for evaluation of his left brachiocephalic AV fistula.  Fistula was placed on 11/30/2019.  The patient has had his previous appointment moved up per request from his nephrologist Dr. Candiss Norse due to some current issues.  The patient was previously having some numbness in his fourth and fifth fingers however it is gotten worse and begun to get painful.  He is now endorsing some coolness of his hand as well as some new wounds on his fingers.  The previous hematoma has shrunken significantly.  However, the dialysis access is heavily bruised due to some infiltration.  There is no current drainage or active bleeding.  No signs symptoms of infection.  The patient states that he has used his fistula a couple of times however still dialyzing on PermCath.  The patient also reports having had steal syndrome before in his left upper extremity however he is unable to state when.  This is with some of his previous accesses.  Today the patient has a flow volume of 1357 which is consistent with his previous ultrasound.  No stricture is seen on ultrasound today however the previous hematoma has shrunken significantly to 1.01 to 1.04 cm.  Overall there is no evidence of significant stenosis within the brachiocephalic AV fistula.     Review of Systems  Skin: Positive for color change and wound.  Neurological: Positive for numbness.  Hematological: Bruises/bleeds easily.  All other systems reviewed and are negative.      Objective:   Physical Exam Vitals reviewed.  Constitutional:      Appearance: Normal appearance.  Cardiovascular:     Rate and Rhythm: Normal rate and regular rhythm.     Pulses:          Radial pulses are 2+ on the right side and 1+ on the left side.     Heart sounds: Normal heart sounds.   Arteriovenous access: left arteriovenous access is present.    Comments: Good thrill and bruit on left brachiocephalic AV fistula Musculoskeletal:        General: Normal range of motion.  Skin:    Findings: Bruising present.  Neurological:     Mental Status: He is alert and oriented to person, place, and time.  Psychiatric:        Mood and Affect: Mood normal.        Behavior: Behavior normal.        Thought Content: Thought content normal.        Judgment: Judgment normal.     BP (!) 163/83 (BP Location: Right Arm)   Pulse 87   Resp 16   Wt 164 lb 9.6 oz (74.7 kg)   BMI 25.78 kg/m   Past Medical History:  Diagnosis Date  . (HFpEF) heart failure with preserved ejection fraction ([place])    a. 05/2019 Echo: EF 50-55%, diast dysfxn, RVSP 56.28mmHg, Sev dil LA. Mildly dil PA.  [name] Anemia   . Anxiety   . Arthritis   . Colon polyps   . COPD (chronic obstructive pulmonary disease) ([place])   . Coronary artery disease    a. 10/2013 NSTEMI/Cath: Severe 3VD-->CABG x 4 @ [facility] 01/2014 (LIMA->LAD, VG->OM1->OM2, VG->RCA); b. 11/2014 MV: No isch/infarct; c. 05/2019 MV: EF 40% (50-55 by echo), small,mild, fixed  apical defect - ? atten vs infarct. No ischemia->low risk.  . ESRD (end stage renal disease) ([place])    a. MWFSat HD  . ETOH abuse    a. 2 beers/night.  [name] GERD (gastroesophageal reflux disease)   . History of pneumonia   . Hyperlipidemia   . Hypertension   . IgA nephropathy   . Lower GI bleed    a. Due to colon polyps. Status post resection of 14 polyps  . Non-ST elevation MI (NSTEMI) ([place]) 2016  . PAF (paroxysmal atrial fibrillation) ([place])    a. Dx 05/2019. CHA2DS2VASc = 3-->Eliquis/Amio.  . Pneumonia 08/2019  . Rheumatoid arthritis ([place])   . Shortness of breath    WITH EXERTION  . Sleep apnea   . Tobacco abuse     Social History   Socioeconomic History  . Marital status: Single    Spouse name: Not on file  . Number of children: Not on file  . Years of education: Not on file  .  Highest education level: 8th grade  Occupational History  . Not on file  Tobacco Use  . Smoking status: Current Every Day Smoker    Packs/day: 0.50    Years: 32.00    Pack years: 16.00    Types: Cigarettes  . Smokeless tobacco: Never Used  . Tobacco comment: daily  Substance and Sexual Activity  . Alcohol use: Not Currently    Alcohol/week: 7.0 standard drinks    Types: 7 Cans of beer per week    Comment: 3 BEERS PER WEEK  . Drug use: No  . Sexual activity: Not on file  Other Topics Concern  . Not on file  Social History Narrative   Lives locally by himself.  Does not routinely exercise.   Social Determinants of Health   Financial Resource Strain:   . Difficulty of Paying Living Expenses:   Food Insecurity:   . Worried About Charity fundraiser in the Last Year:   . Arboriculturist in the Last Year:   Transportation Needs:   . Film/video editor (Medical):   [name] Lack of Transportation (Non-Medical):   Physical Activity:   . Days of Exercise per Week:   . Minutes of Exercise per Session:   Stress:   . Feeling of Stress :   Social Connections:   . Frequency of Communication with Friends and Family:   . Frequency of Social Gatherings with Friends and Family:   . Attends Religious Services:   . Active Member of Clubs or Organizations:   . Attends Archivist Meetings:   [name] Marital Status:   Intimate Partner Violence:   . Fear of Current or Ex-Partner:   . Emotionally Abused:   [name] Physically Abused:   . Sexually Abused:     Past Surgical History:  Procedure Laterality Date  . A/V FISTULAGRAM Left 10/28/2016   Procedure: A/V Fistulagram;  Surgeon: [name], MD;  Location: [facility];  Service: Cardiovascular;  Laterality: Left;  . A/V FISTULAGRAM Left 12/07/2018   Procedure: A/V FISTULAGRAM;  Surgeon: [name], MD;  Location: [facility];  Service: Cardiovascular;  Laterality: Left;  . A/V FISTULAGRAM N/A 06/04/2019   Procedure: A/V  Fistulagram- LEFT;  Surgeon: [name], MD;  Location: [place];  Service: Cardiovascular;  Laterality: N/A;  . A/V SHUNT INTERVENTION N/A 10/28/2016   Procedure: A/V Shunt Intervention;  Surgeon: [name], MD;  Location: [facility]  INVASIVE [facility];  Service: Cardiovascular;  Laterality: N/A;  . A/V SHUNTOGRAM Left 02/01/2019   Procedure: A/V SHUNTOGRAM;  Surgeon: [name], MD;  Location: [place];  Service: Cardiovascular;  Laterality: Left;  . AV FISTULA PLACEMENT    . AV FISTULA PLACEMENT Left 11/30/2019   Procedure: ARTERIOVENOUS (AV) FISTULA CREATION ( BRACHIAL CEPHALIC );  Surgeon: Katha Cabal, MD;  Location: [facility];  Service: Vascular;  Laterality: Left;  . CARDIAC CATHETERIZATION     RCA 90% and calcified mid LAD 80% Stenosis  . CORONARY ARTERY BYPASS GRAFT N/A 11/29/2013   Procedure: CORONARY ARTERY BYPASS GRAFTING (CABG) x 4 using endoscopically harvested right saphenous vein and left internal mammary artery;  Surgeon: [name], MD;  Location: [facility];  Service: Open Heart Surgery;  Laterality: N/A;  . dialysis catheterr  2/15  . DIALYSIS/PERMA CATHETER INSERTION N/A 07/16/2019   Procedure: DIALYSIS/PERMA CATHETER INSERTION WITH VAC CHANGE UNDER SEDATION;  Surgeon: [name], MD;  Location: [place] [facility];  Service: Cardiovascular;  Laterality: N/A;  . DIALYSIS/PERMA CATHETER INSERTION N/A 11/08/2019   Procedure: DIALYSIS/PERMA CATHETER INSERTION;  Surgeon: [name], MD;  Location: [facility];  Service: Cardiovascular;  Laterality: N/A;  . DIALYSIS/PERMA CATHETER REMOVAL N/A 02/15/2019   Procedure: DIALYSIS/PERMA CATHETER REMOVAL;  Surgeon: [name], MD;  Location: [facility];  Service: Cardiovascular;  Laterality: N/A;  . INSERTION OF DIALYSIS CATHETER N/A 12/14/2018   Procedure: INSERTION OF DIALYSIS CATHETER ( [place] );  Surgeon: [name], MD;  Location: [facility];  Service: Vascular;  Laterality: N/A;  .  INTRAOPERATIVE TRANSESOPHAGEAL ECHOCARDIOGRAM N/A 11/29/2013   Procedure: INTRAOPERATIVE TRANSESOPHAGEAL ECHOCARDIOGRAM;  Surgeon: [name], MD;  Location: [facility];  Service: Open Heart Surgery;  Laterality: N/A;  . LIGATION OF ARTERIOVENOUS  FISTULA Left 07/13/2019   Procedure: LIGATION OF ARTERIOVENOUS  FISTULA;  Surgeon: [name], MD;  Location: [facility];  Service: Vascular;  Laterality: Left;  . PERIPHERAL VASCULAR CATHETERIZATION N/A 06/12/2015   Procedure: A/V Shuntogram/Fistulagram;  Surgeon: [name], MD;  Location: [place] [facility];  Service: Cardiovascular;  Laterality: N/A;  . PERIPHERAL VASCULAR CATHETERIZATION Left 06/12/2015   Procedure: A/V Shunt Intervention;  Surgeon: [name], MD;  Location: [place] [facility];  Service: Cardiovascular;  Laterality: Left;  . RENAL BIOPSY Left 14  . REVISON OF ARTERIOVENOUS FISTULA Left 12/14/2018   Procedure: REVISON OF ARTERIOVENOUS FISTULA;  Surgeon: [name], MD;  Location: [facility];  Service: Vascular;  Laterality: Left;  . UPPER EXTREMITY VENOGRAPHY Bilateral 09/18/2019   Procedure: UPPER EXTREMITY VENOGRAPHY;  Surgeon: Katha Cabal, MD;  Location: [facility];  Service: Cardiovascular;  Laterality: Bilateral;    Family History  Problem Relation Age of Onset  . Heart disease Father   . Heart disease Brother   . Healthy Sister   . Stroke Neg Hx     Allergies  Allergen Reactions  . Lisinopril Hives       Assessment & Plan:   1. Complication of arteriovenous dialysis fistula, subsequent encounter Recommend:  The patient is experiencing increasing problems with their dialysis access.  Patient should have an angiogram with the intention for intervention.  The intention for intervention is to restore appropriate flow and prevent thrombosis and possible loss of the access.  As well as improve the quality of dialysis therapy.  The risks, benefits and alternative therapies were reviewed in  detail  with the patient.  All questions were answered.  The patient agrees to proceed with angio/intervention.      2. Essential hypertension Continue antihypertensive medications as already ordered, these medications have been reviewed and there are no changes at this time.   3. Hyperlipidemia, unspecified hyperlipidemia type Continue statin as ordered and reviewed, no changes at this time    Current Outpatient Medications on File Prior to Visit  Medication Sig Dispense Refill  . albuterol (VENTOLIN HFA) 108 (90 Base) MCG/ACT inhaler Inhale 2 puffs into the lungs every 4 (four) hours as needed for shortness of breath or wheezing.    [name] apixaban (ELIQUIS) 5 MG TABS tablet Take 1 tablet (5 mg total) by mouth 2 (two) times daily. 60 tablet 1  . aspirin EC 81 MG tablet Take 1 tablet (81 mg total) by mouth daily. 90 tablet 3  . atorvastatin (LIPITOR) 20 MG tablet take 1 tablet by mouth at bedtime (Patient taking differently: Take 20 mg by mouth every morning. ) 30 tablet 6  . AURYXIA 1 GM 210 MG(Fe) tablet Take 420 mg by mouth 3 (three) times daily with meals.   11  . calcium carbonate (TUMS - DOSED IN MG ELEMENTAL CALCIUM) 500 MG chewable tablet Chew 1 tablet by mouth daily as needed for indigestion or heartburn.     . carvedilol (COREG) 12.5 MG tablet Take 2 tablets (25 mg total) by mouth 2 (two) times daily with a meal. 180 tablet 0  . cinacalcet (SENSIPAR) 30 MG tablet Take 30 mg by mouth daily with supper.     [name] dextromethorphan-guaiFENesin (MUCINEX DM) 30-600 MG 12hr tablet Take 1 tablet by mouth 2 (two) times daily as needed for cough. 30 tablet 0  . docusate sodium (COLACE) 100 MG capsule Take 1 capsule (100 mg total) by mouth 2 (two) times daily as needed for mild constipation. 10 capsule 0  . Fluticasone Propionate, Inhal, (FLOVENT DISKUS) 100 MCG/BLIST AEPB Inhale 1 puff into the lungs 2 (two) times daily. (Patient taking differently: Inhale 1 puff into the lungs every morning. ) 60 each 3    . Fluticasone-Umeclidin-Vilant (TRELEGY ELLIPTA) 100-62.5-25 MCG/INH AEPB Inhale 1 puff into the lungs daily. (Patient taking differently: Inhale 1 puff into the lungs every other day. ) 60 each 5  . folic acid (FOLVITE) 1 MG tablet Take 1 tablet (1 mg total) by mouth daily. 90 tablet 1  . furosemide (LASIX) 80 MG tablet Take 80 mg by mouth 2 (two) times daily.     [name] gabapentin (NEURONTIN) 100 MG capsule Take 100 mg by mouth 3 (three) times daily.   11  . HYDROcodone-acetaminophen (NORCO) 5-325 MG tablet Take 1-2 tablets by mouth every 6 (six) hours as needed for moderate pain or severe pain. 25 tablet 0  . loratadine (CLARITIN) 10 MG tablet Take 1 tablet (10 mg total) by mouth daily. (Patient taking differently: Take 10 mg by mouth every morning. ) 30 tablet 0  . multivitamin (RENA-VIT) TABS tablet Take 1 tablet by mouth at bedtime. 30 tablet 0  . ondansetron (ZOFRAN-ODT) 4 MG disintegrating tablet Take 4 mg by mouth every 8 (eight) hours as needed for nausea.     . sevelamer carbonate (RENVELA) 800 MG tablet Take 800-2,400 mg by mouth See admin instructions. Take 3 tablets (2400mg ) by mouth three times daily before meals and take 1 tablet (800mg ) by mouth once daily with a snack    . SPIRIVA HANDIHALER 18 MCG inhalation capsule Place  18 mcg into inhaler and inhale daily.     [name] zolpidem (AMBIEN) 10 MG tablet Take 10 mg by mouth at bedtime as needed for sleep.     No current facility-administered medications on file prior to visit.    There are no Patient Instructions on file for this visit. No follow-ups on file.   Kris Hartmann, NP

## 2020-01-29 DIAGNOSIS — N186 End stage renal disease: Secondary | ICD-10-CM | POA: Diagnosis not present

## 2020-01-29 DIAGNOSIS — N2581 Secondary hyperparathyroidism of renal origin: Secondary | ICD-10-CM | POA: Diagnosis not present

## 2020-01-29 DIAGNOSIS — D631 Anemia in chronic kidney disease: Secondary | ICD-10-CM | POA: Diagnosis not present

## 2020-01-29 DIAGNOSIS — E8779 Other fluid overload: Secondary | ICD-10-CM | POA: Diagnosis not present

## 2020-01-29 DIAGNOSIS — D509 Iron deficiency anemia, unspecified: Secondary | ICD-10-CM | POA: Diagnosis not present

## 2020-01-29 DIAGNOSIS — Z992 Dependence on renal dialysis: Secondary | ICD-10-CM | POA: Diagnosis not present

## 2020-01-30 DIAGNOSIS — D631 Anemia in chronic kidney disease: Secondary | ICD-10-CM | POA: Diagnosis not present

## 2020-01-30 DIAGNOSIS — Z992 Dependence on renal dialysis: Secondary | ICD-10-CM | POA: Diagnosis not present

## 2020-01-30 DIAGNOSIS — J441 Chronic obstructive pulmonary disease with (acute) exacerbation: Secondary | ICD-10-CM | POA: Diagnosis not present

## 2020-01-30 DIAGNOSIS — D509 Iron deficiency anemia, unspecified: Secondary | ICD-10-CM | POA: Diagnosis not present

## 2020-01-30 DIAGNOSIS — N2581 Secondary hyperparathyroidism of renal origin: Secondary | ICD-10-CM | POA: Diagnosis not present

## 2020-01-30 DIAGNOSIS — E8779 Other fluid overload: Secondary | ICD-10-CM | POA: Diagnosis not present

## 2020-01-30 DIAGNOSIS — N186 End stage renal disease: Secondary | ICD-10-CM | POA: Diagnosis not present

## 2020-02-01 DIAGNOSIS — Z992 Dependence on renal dialysis: Secondary | ICD-10-CM | POA: Diagnosis not present

## 2020-02-01 DIAGNOSIS — N186 End stage renal disease: Secondary | ICD-10-CM | POA: Diagnosis not present

## 2020-02-01 DIAGNOSIS — D509 Iron deficiency anemia, unspecified: Secondary | ICD-10-CM | POA: Diagnosis not present

## 2020-02-01 DIAGNOSIS — E8779 Other fluid overload: Secondary | ICD-10-CM | POA: Diagnosis not present

## 2020-02-01 DIAGNOSIS — D631 Anemia in chronic kidney disease: Secondary | ICD-10-CM | POA: Diagnosis not present

## 2020-02-01 DIAGNOSIS — N2581 Secondary hyperparathyroidism of renal origin: Secondary | ICD-10-CM | POA: Diagnosis not present

## 2020-02-04 DIAGNOSIS — E8779 Other fluid overload: Secondary | ICD-10-CM | POA: Diagnosis not present

## 2020-02-04 DIAGNOSIS — D509 Iron deficiency anemia, unspecified: Secondary | ICD-10-CM | POA: Diagnosis not present

## 2020-02-04 DIAGNOSIS — Z992 Dependence on renal dialysis: Secondary | ICD-10-CM | POA: Diagnosis not present

## 2020-02-04 DIAGNOSIS — D631 Anemia in chronic kidney disease: Secondary | ICD-10-CM | POA: Diagnosis not present

## 2020-02-04 DIAGNOSIS — N2581 Secondary hyperparathyroidism of renal origin: Secondary | ICD-10-CM | POA: Diagnosis not present

## 2020-02-04 DIAGNOSIS — N186 End stage renal disease: Secondary | ICD-10-CM | POA: Diagnosis not present

## 2020-02-05 ENCOUNTER — Other Ambulatory Visit
Admission: RE | Admit: 2020-02-05 | Discharge: 2020-02-05 | Disposition: A | Discharge status: Home / Self Care | Payer: Medicare Other | Source: Ambulatory Visit | Attending: Vascular Surgery | Admitting: Vascular Surgery

## 2020-02-05 ENCOUNTER — Encounter (INDEPENDENT_AMBULATORY_CARE_PROVIDER_SITE_OTHER): Payer: Medicare Other

## 2020-02-05 ENCOUNTER — Ambulatory Visit (INDEPENDENT_AMBULATORY_CARE_PROVIDER_SITE_OTHER): Payer: Medicare Other | Admitting: Nurse Practitioner

## 2020-02-05 ENCOUNTER — Other Ambulatory Visit: Payer: Self-pay

## 2020-02-05 DIAGNOSIS — Z01812 Encounter for preprocedural laboratory examination: Secondary | ICD-10-CM | POA: Insufficient documentation

## 2020-02-05 DIAGNOSIS — Z20822 Contact with and (suspected) exposure to covid-19: Secondary | ICD-10-CM | POA: Insufficient documentation

## 2020-02-05 LAB — SARS CORONAVIRUS 2 (TAT 6-24 HRS): SARS Coronavirus 2: NEGATIVE

## 2020-02-06 ENCOUNTER — Other Ambulatory Visit (INDEPENDENT_AMBULATORY_CARE_PROVIDER_SITE_OTHER): Payer: Self-pay | Admitting: Nurse Practitioner

## 2020-02-06 DIAGNOSIS — N2581 Secondary hyperparathyroidism of renal origin: Secondary | ICD-10-CM | POA: Diagnosis not present

## 2020-02-06 DIAGNOSIS — N186 End stage renal disease: Secondary | ICD-10-CM | POA: Diagnosis not present

## 2020-02-06 DIAGNOSIS — D631 Anemia in chronic kidney disease: Secondary | ICD-10-CM | POA: Diagnosis not present

## 2020-02-06 DIAGNOSIS — E8779 Other fluid overload: Secondary | ICD-10-CM | POA: Diagnosis not present

## 2020-02-06 DIAGNOSIS — Z992 Dependence on renal dialysis: Secondary | ICD-10-CM | POA: Diagnosis not present

## 2020-02-06 DIAGNOSIS — D509 Iron deficiency anemia, unspecified: Secondary | ICD-10-CM | POA: Diagnosis not present

## 2020-02-07 ENCOUNTER — Encounter
Admission: RE | Disposition: A | Discharge status: Home / Self Care | Payer: Self-pay | Source: Home / Self Care | Attending: Vascular Surgery

## 2020-02-07 ENCOUNTER — Other Ambulatory Visit: Payer: Self-pay

## 2020-02-07 ENCOUNTER — Ambulatory Visit
Admission: RE | Admit: 2020-02-07 | Discharge: 2020-02-07 | Disposition: A | Discharge status: Home / Self Care | Payer: Medicare Other | Attending: Vascular Surgery | Admitting: Vascular Surgery

## 2020-02-07 ENCOUNTER — Encounter: Payer: Self-pay | Admitting: Vascular Surgery

## 2020-02-07 ENCOUNTER — Other Ambulatory Visit (INDEPENDENT_AMBULATORY_CARE_PROVIDER_SITE_OTHER): Payer: Self-pay | Admitting: Nurse Practitioner

## 2020-02-07 DIAGNOSIS — I252 Old myocardial infarction: Secondary | ICD-10-CM | POA: Diagnosis not present

## 2020-02-07 DIAGNOSIS — M069 Rheumatoid arthritis, unspecified: Secondary | ICD-10-CM | POA: Diagnosis not present

## 2020-02-07 DIAGNOSIS — I5030 Unspecified diastolic (congestive) heart failure: Secondary | ICD-10-CM | POA: Diagnosis not present

## 2020-02-07 DIAGNOSIS — Z79899 Other long term (current) drug therapy: Secondary | ICD-10-CM | POA: Diagnosis not present

## 2020-02-07 DIAGNOSIS — Z951 Presence of aortocoronary bypass graft: Secondary | ICD-10-CM | POA: Diagnosis not present

## 2020-02-07 DIAGNOSIS — I132 Hypertensive heart and chronic kidney disease with heart failure and with stage 5 chronic kidney disease, or end stage renal disease: Secondary | ICD-10-CM | POA: Diagnosis not present

## 2020-02-07 DIAGNOSIS — E785 Hyperlipidemia, unspecified: Secondary | ICD-10-CM | POA: Diagnosis not present

## 2020-02-07 DIAGNOSIS — N028 Recurrent and persistent hematuria with other morphologic changes: Secondary | ICD-10-CM | POA: Insufficient documentation

## 2020-02-07 DIAGNOSIS — N186 End stage renal disease: Secondary | ICD-10-CM | POA: Insufficient documentation

## 2020-02-07 DIAGNOSIS — T82898A Other specified complication of vascular prosthetic devices, implants and grafts, initial encounter: Secondary | ICD-10-CM | POA: Diagnosis not present

## 2020-02-07 DIAGNOSIS — Z7951 Long term (current) use of inhaled steroids: Secondary | ICD-10-CM | POA: Insufficient documentation

## 2020-02-07 DIAGNOSIS — F17210 Nicotine dependence, cigarettes, uncomplicated: Secondary | ICD-10-CM | POA: Diagnosis not present

## 2020-02-07 DIAGNOSIS — K219 Gastro-esophageal reflux disease without esophagitis: Secondary | ICD-10-CM | POA: Insufficient documentation

## 2020-02-07 DIAGNOSIS — G4730 Sleep apnea, unspecified: Secondary | ICD-10-CM | POA: Diagnosis not present

## 2020-02-07 DIAGNOSIS — I2510 Atherosclerotic heart disease of native coronary artery without angina pectoris: Secondary | ICD-10-CM | POA: Diagnosis not present

## 2020-02-07 DIAGNOSIS — J449 Chronic obstructive pulmonary disease, unspecified: Secondary | ICD-10-CM | POA: Diagnosis not present

## 2020-02-07 DIAGNOSIS — Y832 Surgical operation with anastomosis, bypass or graft as the cause of abnormal reaction of the patient, or of later complication, without mention of misadventure at the time of the procedure: Secondary | ICD-10-CM | POA: Insufficient documentation

## 2020-02-07 DIAGNOSIS — M1990 Unspecified osteoarthritis, unspecified site: Secondary | ICD-10-CM | POA: Diagnosis not present

## 2020-02-07 DIAGNOSIS — I480 Paroxysmal atrial fibrillation: Secondary | ICD-10-CM | POA: Insufficient documentation

## 2020-02-07 DIAGNOSIS — Z8249 Family history of ischemic heart disease and other diseases of the circulatory system: Secondary | ICD-10-CM | POA: Diagnosis not present

## 2020-02-07 DIAGNOSIS — Z7982 Long term (current) use of aspirin: Secondary | ICD-10-CM | POA: Insufficient documentation

## 2020-02-07 DIAGNOSIS — Z992 Dependence on renal dialysis: Secondary | ICD-10-CM | POA: Insufficient documentation

## 2020-02-07 DIAGNOSIS — Z7901 Long term (current) use of anticoagulants: Secondary | ICD-10-CM | POA: Insufficient documentation

## 2020-02-07 DIAGNOSIS — Z888 Allergy status to other drugs, medicaments and biological substances status: Secondary | ICD-10-CM | POA: Insufficient documentation

## 2020-02-07 HISTORY — PX: UPPER EXTREMITY ANGIOGRAPHY: CATH118270

## 2020-02-07 LAB — POTASSIUM (ARMC VASCULAR LAB ONLY): Potassium (ARMC vascular lab): 4.3 (ref 3.5–5.1)

## 2020-02-07 SURGERY — UPPER EXTREMITY ANGIOGRAPHY
Anesthesia: Moderate Sedation | Site: Arm Upper | Laterality: Left

## 2020-02-07 MED ORDER — DIPHENHYDRAMINE HCL 50 MG/ML IJ SOLN: 50.0000 mg | Freq: Once | INTRAMUSCULAR | Status: DC | PRN

## 2020-02-07 MED ORDER — SODIUM CHLORIDE 0.9 % IV SOLN: INTRAVENOUS | Status: DC

## 2020-02-07 MED ORDER — IODIXANOL 320 MG/ML IV SOLN
INTRAVENOUS | Status: DC | PRN
  Administered 2020-02-07: 50 mL via INTRA_ARTERIAL

## 2020-02-07 MED ORDER — HEPARIN SODIUM (PORCINE) 1000 UNIT/ML IJ SOLN
INTRAMUSCULAR | Status: DC | PRN
  Administered 2020-02-07: 3000 [IU] via INTRAVENOUS

## 2020-02-07 MED ORDER — ONDANSETRON HCL 4 MG/2ML IJ SOLN: 4.0000 mg | Freq: Four times a day (QID) | INTRAMUSCULAR | Status: DC | PRN

## 2020-02-07 MED ORDER — CEFAZOLIN SODIUM-DEXTROSE 2-4 GM/100ML-% IV SOLN: 2.0000 g | Freq: Once | INTRAVENOUS | Status: DC

## 2020-02-07 MED ORDER — FAMOTIDINE 20 MG PO TABS: 40.0000 mg | ORAL_TABLET | Freq: Once | ORAL | Status: DC | PRN

## 2020-02-07 MED ORDER — CEFAZOLIN SODIUM-DEXTROSE 1-4 GM/50ML-% IV SOLN
INTRAVENOUS | Status: AC
  Filled 2020-02-07: qty 50

## 2020-02-07 MED ORDER — HEPARIN SODIUM (PORCINE) 1000 UNIT/ML IJ SOLN
INTRAMUSCULAR | Status: AC
  Filled 2020-02-07: qty 1

## 2020-02-07 MED ORDER — METHYLPREDNISOLONE SODIUM SUCC 125 MG IJ SOLR: 125.0000 mg | Freq: Once | INTRAMUSCULAR | Status: DC | PRN

## 2020-02-07 MED ORDER — CEFAZOLIN SODIUM-DEXTROSE 1-4 GM/50ML-% IV SOLN
1.0000 g | Freq: Once | INTRAVENOUS | Status: AC
  Administered 2020-02-07: 1 g via INTRAVENOUS

## 2020-02-07 MED ORDER — FENTANYL CITRATE (PF) 100 MCG/2ML IJ SOLN
INTRAMUSCULAR | Status: AC
  Filled 2020-02-07: qty 2

## 2020-02-07 MED ORDER — MIDAZOLAM HCL 2 MG/2ML IJ SOLN
INTRAMUSCULAR | Status: DC | PRN
  Administered 2020-02-07: 2 mg via INTRAVENOUS
  Administered 2020-02-07: 1 mg via INTRAVENOUS

## 2020-02-07 MED ORDER — HYDROMORPHONE HCL 1 MG/ML IJ SOLN: 1.0000 mg | Freq: Once | INTRAMUSCULAR | Status: DC | PRN

## 2020-02-07 MED ORDER — MIDAZOLAM HCL 2 MG/ML PO SYRP: 8.0000 mg | ORAL_SOLUTION | Freq: Once | ORAL | Status: DC | PRN

## 2020-02-07 MED ORDER — FENTANYL CITRATE (PF) 100 MCG/2ML IJ SOLN
INTRAMUSCULAR | Status: DC | PRN
  Administered 2020-02-07: 50 ug via INTRAVENOUS
  Administered 2020-02-07: 25 ug via INTRAVENOUS

## 2020-02-07 MED ORDER — MIDAZOLAM HCL 5 MG/5ML IJ SOLN
INTRAMUSCULAR | Status: AC
  Filled 2020-02-07: qty 5

## 2020-02-07 SURGICAL SUPPLY — 16 items
BALLN ULTRVRSE 018 2.5X150X150 (BALLOONS) ×3
BALLN ULTRVRSE 3X100X150 (BALLOONS) ×3
BALLOON ULTRVRSE 3X100X150 (BALLOONS) ×1 IMPLANT
BALLOON ULTRVS 018 2.5X150X150 (BALLOONS) ×1 IMPLANT
CATH ANGIO 5F 100CM .035 PIG (CATHETERS) ×3 IMPLANT
CATH BEACON 5 .035 100 H1 TIP (CATHETERS) ×3 IMPLANT
CATH CXI SUPP ANG 4FR 135 (CATHETERS) ×1 IMPLANT
CATH CXI SUPP ANG 4FR 135CM (CATHETERS) ×3
DEVICE PRESTO INFLATION (MISCELLANEOUS) ×3 IMPLANT
DEVICE STARCLOSE SE CLOSURE (Vascular Products) ×3 IMPLANT
GLIDEWIRE ANGLED SS 035X260CM (WIRE) ×3 IMPLANT
PACK ANGIOGRAPHY (CUSTOM PROCEDURE TRAY) ×3 IMPLANT
SHEATH BRITE TIP 5FRX11 (SHEATH) ×3 IMPLANT
TUBING CONTRAST HIGH PRESS 72 (TUBING) ×3 IMPLANT
WIRE G V18X300CM (WIRE) ×3 IMPLANT
WIRE J 3MM .035X145CM (WIRE) ×3 IMPLANT

## 2020-02-07 NOTE — Op Note (Signed)
OPERATIVE REPORT   PREOPERATIVE DIAGNOSIS: 1. End-stage renal disease. 2. Steal syndrome, left arm with patent brachiocephalic AVF.  POSTOPERATIVE DIAGNOSIS: Same as above  PROCEDURE PERFORMED: 1. Ultrasound guidance vascular access to right femoral artery. 2. Catheter placement to left radial artery and left ulnar arteries  from right femoral approach. 3. Thoracic aortogram and selective left upper extremity angiogram  including selective images of the radial and ulnar arteries. 4.  Percutaneous transluminal angioplasty of the distal ulnar artery with 2.5 mm diameter by 15 cm length angioplasty balloon 5.  Percutaneous transluminal angioplasty of the left radial artery with 3 mm diameter by 10 cm length angioplasty balloon. 6. StarClose closure device right femoral artery.  SURGEON: [name], MD  ANESTHESIA: Local with moderate conscious sedation for 35 minutes using 3 mg of Versed and 75 mcg of Fentanyl  BLOOD LOSS: Minimal.  FLUOROSCOPY TIME: 7.9  INDICATION FOR PROCEDURE: This is a 59 y.o.male who presented to our office with steal syndrome. The patient's left brachiocephalic AVF is working well, but their hand is numb and painful. To further evaluate this to determine what options would be possible to treat the steal syndrome, angiogram of the left upper extremity is indicated. Risks and benefits are discussed. Informed consent was obtained.  DESCRIPTION OF PROCEDURE: The patient was brought to the vascular suite. Moderate conscious sedation was administered during a face to face encounter with the patient throughout the procedure with my supervision of the RN administering medicines and monitoring the patient's vital signs, pulse oximetry, telemetry and mental status throughout from the start of the procedure until the patient was taken to the recovery room.  Groins were shaved and prepped and sterile surgical field was created. The right  femoral head was localized with fluoroscopy and the right femoral artery was then visualized with ultrasound and found to be widely patent. It was then accessed under direct ultrasound guidance without difficulty with a Seldinger needle and a permanent image was recorded. A J-wire and 5-French sheath were then placed. Pigtail catheter was placed into the ascending aorta and a thoracic aortogram was then performed in the LAO projection. This demonstrated normal origins to the great vessels without significant proximal stenoses and a normal configuration of the great vessels. The patient was given 3000 units of intravenous heparin and a headhunter catheter was used to selectively cannulate the left subclavian artery without difficulty. This was then sequentially advanced to the brachial artery and to the brachial bifurcation.  Steal was demonstrated with nearly all of the flow from the artery going into the fistula and not downstream to the hand on images with the catheter proximal to the access. I then advanced a catheter beyond the access and into the radial and ulnar arteries.  The ulnar artery was entered first after we exchanged for a CXI catheter to evaluate more distally in the ulnar artery.  This demonstrated an approximately 70% stenosis in the ulnar artery in the midsegment and occlusion of the distal ulnar artery at and just above the wrist.  There was reconstitution through the palmar arch in the hand.  I then used a CXI catheter and Glidewire to cannulate the radial artery and selective imaging showed a moderate stenosis in the proximal portion of the radial artery in the 60% range.  Distally, the radial artery occluded at and beyond the previous fistula site and filled some collaterals into the hand. I elected to perform intervention to try to improve the flow to his hand.  Using the CXI catheter and a V 18 wire, across the ulnar artery lesions without difficulty getting a wire into the  palmar arch.  A 2.5 mm diameter by 15 cm length angioplasty balloon was inflated from the mid ulnar artery to encompass the moderate proximal lesion and the occlusion at.  This is inflated to 12 atm for 1 minute.  Completion imaging with a CXI catheter and the proximal ulnar artery showed less than 20% residual stenosis at the mid ulnar artery and now inline flow into the hand with mild residual stenosis in the 30 to 40% range at the wrist.  I then turned my attention to the radial artery.  Using a CXI catheter and the V 18 wire was able to cross this area without difficulty parking a wire in the distal arm.  A 3 mm diameter by 10 cm length angioplasty balloon was inflated to 10 atm for 1 minute.  CXI catheter was used to perform selective imaging with about a 15-20% residual stenosis in the radial artery after angioplasty.  The catheter was then brought proximal to the access and more flow did go downstream mostly through the ulnar artery is the better runoff into the hand. The diagnostic catheter was removed. Oblique arteriogram was performed of the right femoral artery and StarClose closure device deployed in the usual fashion with excellent hemostatic result. The patient tolerated the procedure well and was taken to the recovery room in stable condition.   Daryl Weeks 02/07/2020 11:46 AM

## 2020-02-07 NOTE — H&P (Signed)
[facility] History & Physical Update  The patient was interviewed and re-examined.  The patient's previous History and Physical has been reviewed and is unchanged.  There is no change in the plan of care. We plan to proceed with the scheduled procedure.  Leotis Pain, MD  02/07/2020, 9:07 AM

## 2020-02-08 ENCOUNTER — Encounter: Payer: Self-pay | Admitting: Cardiology

## 2020-02-08 DIAGNOSIS — E8779 Other fluid overload: Secondary | ICD-10-CM | POA: Diagnosis not present

## 2020-02-08 DIAGNOSIS — Z992 Dependence on renal dialysis: Secondary | ICD-10-CM | POA: Diagnosis not present

## 2020-02-08 DIAGNOSIS — D631 Anemia in chronic kidney disease: Secondary | ICD-10-CM | POA: Diagnosis not present

## 2020-02-08 DIAGNOSIS — D509 Iron deficiency anemia, unspecified: Secondary | ICD-10-CM | POA: Diagnosis not present

## 2020-02-08 DIAGNOSIS — N2581 Secondary hyperparathyroidism of renal origin: Secondary | ICD-10-CM | POA: Diagnosis not present

## 2020-02-08 DIAGNOSIS — N186 End stage renal disease: Secondary | ICD-10-CM | POA: Diagnosis not present

## 2020-02-11 DIAGNOSIS — E8779 Other fluid overload: Secondary | ICD-10-CM | POA: Diagnosis not present

## 2020-02-11 DIAGNOSIS — D631 Anemia in chronic kidney disease: Secondary | ICD-10-CM | POA: Diagnosis not present

## 2020-02-11 DIAGNOSIS — N186 End stage renal disease: Secondary | ICD-10-CM | POA: Diagnosis not present

## 2020-02-11 DIAGNOSIS — Z992 Dependence on renal dialysis: Secondary | ICD-10-CM | POA: Diagnosis not present

## 2020-02-11 DIAGNOSIS — N2581 Secondary hyperparathyroidism of renal origin: Secondary | ICD-10-CM | POA: Diagnosis not present

## 2020-02-11 DIAGNOSIS — D509 Iron deficiency anemia, unspecified: Secondary | ICD-10-CM | POA: Diagnosis not present

## 2020-02-12 ENCOUNTER — Encounter: Payer: Self-pay | Admitting: Cardiology

## 2020-02-13 ENCOUNTER — Telehealth (INDEPENDENT_AMBULATORY_CARE_PROVIDER_SITE_OTHER): Payer: Self-pay

## 2020-02-13 NOTE — Telephone Encounter (Signed)
A fax was received from [facility] for the patient to have a permcath removal, patient is now scheduled with Dr. Lucky Cowboy  On 02/21/20 with a 10:45 am arrival time to the [facility]. Covid testing is on 02/19/20 between 8-1 pm at the [facility]. Pre-procedure instructions will be faxed to [facility] at [facility].

## 2020-02-19 ENCOUNTER — Other Ambulatory Visit
Admission: RE | Admit: 2020-02-19 | Discharge: 2020-02-19 | Disposition: A | Discharge status: Home / Self Care | Payer: Medicare Other | Source: Ambulatory Visit | Attending: Vascular Surgery | Admitting: Vascular Surgery

## 2020-02-19 ENCOUNTER — Other Ambulatory Visit: Payer: Self-pay

## 2020-02-19 DIAGNOSIS — Z20822 Contact with and (suspected) exposure to covid-19: Secondary | ICD-10-CM | POA: Diagnosis not present

## 2020-02-19 DIAGNOSIS — Z992 Dependence on renal dialysis: Secondary | ICD-10-CM | POA: Diagnosis not present

## 2020-02-19 DIAGNOSIS — Z01812 Encounter for preprocedural laboratory examination: Secondary | ICD-10-CM | POA: Diagnosis not present

## 2020-02-19 DIAGNOSIS — N186 End stage renal disease: Secondary | ICD-10-CM | POA: Diagnosis not present

## 2020-02-19 DIAGNOSIS — E8779 Other fluid overload: Secondary | ICD-10-CM | POA: Diagnosis not present

## 2020-02-19 LAB — SARS CORONAVIRUS 2 (TAT 6-24 HRS): SARS Coronavirus 2: NEGATIVE

## 2020-02-20 ENCOUNTER — Other Ambulatory Visit (INDEPENDENT_AMBULATORY_CARE_PROVIDER_SITE_OTHER): Payer: Self-pay | Admitting: Nurse Practitioner

## 2020-02-21 ENCOUNTER — Other Ambulatory Visit (INDEPENDENT_AMBULATORY_CARE_PROVIDER_SITE_OTHER): Payer: Self-pay | Admitting: Nurse Practitioner

## 2020-02-21 ENCOUNTER — Encounter
Admission: RE | Disposition: A | Discharge status: Home / Self Care | Payer: Self-pay | Source: Home / Self Care | Attending: Vascular Surgery

## 2020-02-21 ENCOUNTER — Telehealth (INDEPENDENT_AMBULATORY_CARE_PROVIDER_SITE_OTHER): Payer: Self-pay

## 2020-02-21 ENCOUNTER — Ambulatory Visit
Admission: RE | Admit: 2020-02-21 | Discharge: 2020-02-21 | Disposition: A | Discharge status: Home / Self Care | Payer: Medicare Other | Attending: Vascular Surgery | Admitting: Vascular Surgery

## 2020-02-21 ENCOUNTER — Other Ambulatory Visit: Payer: Self-pay

## 2020-02-21 ENCOUNTER — Encounter: Payer: Self-pay | Admitting: Vascular Surgery

## 2020-02-21 DIAGNOSIS — Z951 Presence of aortocoronary bypass graft: Secondary | ICD-10-CM | POA: Diagnosis not present

## 2020-02-21 DIAGNOSIS — F419 Anxiety disorder, unspecified: Secondary | ICD-10-CM | POA: Insufficient documentation

## 2020-02-21 DIAGNOSIS — Z888 Allergy status to other drugs, medicaments and biological substances status: Secondary | ICD-10-CM | POA: Insufficient documentation

## 2020-02-21 DIAGNOSIS — K219 Gastro-esophageal reflux disease without esophagitis: Secondary | ICD-10-CM | POA: Diagnosis not present

## 2020-02-21 DIAGNOSIS — G4730 Sleep apnea, unspecified: Secondary | ICD-10-CM | POA: Insufficient documentation

## 2020-02-21 DIAGNOSIS — D649 Anemia, unspecified: Secondary | ICD-10-CM | POA: Insufficient documentation

## 2020-02-21 DIAGNOSIS — N186 End stage renal disease: Secondary | ICD-10-CM | POA: Insufficient documentation

## 2020-02-21 DIAGNOSIS — Z79899 Other long term (current) drug therapy: Secondary | ICD-10-CM | POA: Diagnosis not present

## 2020-02-21 DIAGNOSIS — E785 Hyperlipidemia, unspecified: Secondary | ICD-10-CM | POA: Diagnosis not present

## 2020-02-21 DIAGNOSIS — Z7982 Long term (current) use of aspirin: Secondary | ICD-10-CM | POA: Insufficient documentation

## 2020-02-21 DIAGNOSIS — I132 Hypertensive heart and chronic kidney disease with heart failure and with stage 5 chronic kidney disease, or end stage renal disease: Secondary | ICD-10-CM | POA: Insufficient documentation

## 2020-02-21 DIAGNOSIS — I480 Paroxysmal atrial fibrillation: Secondary | ICD-10-CM | POA: Diagnosis not present

## 2020-02-21 DIAGNOSIS — F17210 Nicotine dependence, cigarettes, uncomplicated: Secondary | ICD-10-CM | POA: Insufficient documentation

## 2020-02-21 DIAGNOSIS — I5032 Chronic diastolic (congestive) heart failure: Secondary | ICD-10-CM | POA: Diagnosis not present

## 2020-02-21 DIAGNOSIS — I252 Old myocardial infarction: Secondary | ICD-10-CM | POA: Diagnosis not present

## 2020-02-21 DIAGNOSIS — J449 Chronic obstructive pulmonary disease, unspecified: Secondary | ICD-10-CM | POA: Diagnosis not present

## 2020-02-21 DIAGNOSIS — Z4901 Encounter for fitting and adjustment of extracorporeal dialysis catheter: Secondary | ICD-10-CM | POA: Insufficient documentation

## 2020-02-21 DIAGNOSIS — Z992 Dependence on renal dialysis: Secondary | ICD-10-CM | POA: Diagnosis not present

## 2020-02-21 DIAGNOSIS — M069 Rheumatoid arthritis, unspecified: Secondary | ICD-10-CM | POA: Diagnosis not present

## 2020-02-21 DIAGNOSIS — I2510 Atherosclerotic heart disease of native coronary artery without angina pectoris: Secondary | ICD-10-CM | POA: Diagnosis not present

## 2020-02-21 HISTORY — PX: DIALYSIS/PERMA CATHETER REMOVAL: CATH118289

## 2020-02-21 SURGERY — DIALYSIS/PERMA CATHETER REMOVAL
Anesthesia: LOCAL

## 2020-02-21 MED ORDER — HYDROCODONE-ACETAMINOPHEN 5-325 MG PO TABS: 1.0000 | ORAL_TABLET | Freq: Four times a day (QID) | ORAL | 0 refills | Status: DC | PRN

## 2020-02-21 SURGICAL SUPPLY — 3 items
BLADE SURG SZ11 CARB STEEL (BLADE) ×2 IMPLANT
FORCEPS HALSTEAD CVD 5IN STRL (INSTRUMENTS) ×2 IMPLANT
TRAY LACERAT/PLASTIC (MISCELLANEOUS) ×2 IMPLANT

## 2020-02-21 NOTE — Discharge Instructions (Signed)
Tunneled Catheter Removal, Care After Refer to this sheet in the next few weeks. These instructions provide you with information about caring for yourself after your procedure. Your health care provider may also give you more specific instructions. Your treatment has been planned according to current medical practices, but problems sometimes occur. Call your health care provider if you have any problems or questions after your procedure. What can I expect after the procedure? After the procedure, it is common to have: Some mild redness, swelling, and pain around your catheter site.   Follow these instructions at home: Incision care  Check your removal site  every day for signs of infection. Check for: More redness, swelling, or pain. More fluid or blood. Warmth. Pus or a bad smell. Remove your dressing in 48hrs leave open to air  Activity  Return to your normal activities as told by your health care provider. Ask your health care provider what activities are safe for you. Do not lift anything that is heavier than 10 lb (4.5 kg) for 3 days  You may shower tomorrow  Contact a health care provider if: You have more fluid or blood coming from your removal site You have more redness, swelling, or pain at your incisions or around the area where your catheter was removed Your removal site feel warm to the touch. You feel unusually weak. You feel nauseous.. Get help right away if You have swelling in your arm, shoulder, neck, or face. You develop chest pain. You have difficulty breathing. You feel dizzy or light-headed. You have pus or a bad smell coming from your removal site You have a fever. You develop bleeding from your removal site, and your bleeding does not stop. This information is not intended to replace advice given to you by your health care provider. Make sure you discuss any questions you have with your health care provider. Document Released: 08/16/2012 Document Revised:  05/02/2016 Document Reviewed: 05/26/2015 Elsevier Interactive Patient Education  2017 [employer] 

## 2020-02-21 NOTE — H&P (Signed)
[facility] Admission History & Physical  MRN : [id number]  Daryl Weeks is a 59 y.o. (Oct 21, 1960) male who presents with chief complaint of scheduled PermCath removal.  History of Present Illness:  I am asked to evaluate the patient by the dialysis center.  The patient has a functioning dialysis access in no longer is in need of his PermCath.  He presents today for a scheduled PermCath removal.  Patient denies any issues at the PermCath site.  Denies any fever, nausea vomiting.  Denies any shortness breath or chest pain.  No current facility-administered medications for this encounter.   Past Medical History:  Diagnosis Date  . (HFpEF) heart failure with preserved ejection fraction ([place])    a. 05/2019 Echo: EF 50-55%, diast dysfxn, RVSP 56.79mmHg, Sev dil LA. Mildly dil PA.  [name] Anemia   . Anxiety   . Arthritis   . Colon polyps   . COPD (chronic obstructive pulmonary disease) ([place])   . Coronary artery disease    a. 10/2013 NSTEMI/Cath: Severe 3VD-->CABG x 4 @ [facility] 01/2014 (LIMA->LAD, VG->OM1->OM2, VG->RCA); b. 11/2014 MV: No isch/infarct; c. 05/2019 MV: EF 40% (50-55 by echo), small,mild, fixed apical defect - ? atten vs infarct. No ischemia->low risk.  . ESRD (end stage renal disease) ([place])    a. MWFSat HD  . ETOH abuse    a. 2 beers/night.  [name] GERD (gastroesophageal reflux disease)   . History of pneumonia   . Hyperlipidemia   . Hypertension   . IgA nephropathy   . Lower GI bleed    a. Due to colon polyps. Status post resection of 14 polyps  . Non-ST elevation MI (NSTEMI) ([place]) 2016  . PAF (paroxysmal atrial fibrillation) ([place])    a. Dx 05/2019. CHA2DS2VASc = 3-->Eliquis/Amio.  . Pneumonia 08/2019  . Rheumatoid arthritis ([place])   . Shortness of breath    WITH EXERTION  . Sleep apnea   . Tobacco abuse    Past Surgical History:  Procedure Laterality Date  . A/V FISTULAGRAM Left 10/28/2016   Procedure: A/V Fistulagram;  Surgeon: [name], MD;  Location:  [facility];  Service: Cardiovascular;  Laterality: Left;  . A/V FISTULAGRAM Left 12/07/2018   Procedure: A/V FISTULAGRAM;  Surgeon: [name], MD;  Location: [facility];  Service: Cardiovascular;  Laterality: Left;  . A/V FISTULAGRAM N/A 06/04/2019   Procedure: A/V Fistulagram- LEFT;  Surgeon: [name], MD;  Location: [facility];  Service: Cardiovascular;  Laterality: N/A;  . A/V SHUNT INTERVENTION N/A 10/28/2016   Procedure: A/V Shunt Intervention;  Surgeon: [name], MD;  Location: [place] [facility];  Service: Cardiovascular;  Laterality: N/A;  . A/V SHUNTOGRAM Left 02/01/2019   Procedure: A/V SHUNTOGRAM;  Surgeon: [name], MD;  Location: [facility];  Service: Cardiovascular;  Laterality: Left;  . AV FISTULA PLACEMENT    . AV FISTULA PLACEMENT Left 11/30/2019   Procedure: ARTERIOVENOUS (AV) FISTULA CREATION ( BRACHIAL CEPHALIC );  Surgeon: Katha Cabal, MD;  Location: [facility];  Service: Vascular;  Laterality: Left;  . CARDIAC CATHETERIZATION     RCA 90% and calcified mid LAD 80% Stenosis  . CORONARY ARTERY BYPASS GRAFT N/A 11/29/2013   Procedure: CORONARY ARTERY BYPASS GRAFTING (CABG) x 4 using endoscopically harvested right saphenous vein and left internal mammary artery;  Surgeon: [name], MD;  Location: [facility];  Service: Open Heart Surgery;  Laterality: N/A;  .  dialysis catheterr  2/15  . DIALYSIS/PERMA CATHETER INSERTION N/A 07/16/2019   Procedure: DIALYSIS/PERMA CATHETER INSERTION WITH VAC CHANGE UNDER SEDATION;  Surgeon: [name], MD;  Location: [facility];  Service: Cardiovascular;  Laterality: N/A;  . DIALYSIS/PERMA CATHETER INSERTION N/A 11/08/2019   Procedure: DIALYSIS/PERMA CATHETER INSERTION;  Surgeon: [name], MD;  Location: [facility];  Service: Cardiovascular;  Laterality: N/A;  . DIALYSIS/PERMA CATHETER REMOVAL N/A 02/15/2019   Procedure: DIALYSIS/PERMA CATHETER REMOVAL;  Surgeon: [name], MD;  Location: [facility];  Service: Cardiovascular;  Laterality: N/A;  . INSERTION OF DIALYSIS CATHETER N/A 12/14/2018   Procedure: INSERTION OF DIALYSIS CATHETER ( [place] );  Surgeon: [name], MD;  Location: [facility];  Service: Vascular;  Laterality: N/A;  . INTRAOPERATIVE TRANSESOPHAGEAL ECHOCARDIOGRAM N/A 11/29/2013   Procedure: INTRAOPERATIVE TRANSESOPHAGEAL ECHOCARDIOGRAM;  Surgeon: [name], MD;  Location: [facility];  Service: Open Heart Surgery;  Laterality: N/A;  . LIGATION OF ARTERIOVENOUS  FISTULA Left 07/13/2019   Procedure: LIGATION OF ARTERIOVENOUS  FISTULA;  Surgeon: [name], MD;  Location: [facility];  Service: Vascular;  Laterality: Left;  . PERIPHERAL VASCULAR CATHETERIZATION N/A 06/12/2015   Procedure: A/V Shuntogram/Fistulagram;  Surgeon: [name], MD;  Location: [facility];  Service: Cardiovascular;  Laterality: N/A;  . PERIPHERAL VASCULAR CATHETERIZATION Left 06/12/2015   Procedure: A/V Shunt Intervention;  Surgeon: [name], MD;  Location: [place] [facility];  Service: Cardiovascular;  Laterality: Left;  . RENAL BIOPSY Left 14  . REVISON OF ARTERIOVENOUS FISTULA Left 12/14/2018   Procedure: REVISON OF ARTERIOVENOUS FISTULA;  Surgeon: [name], MD;  Location: [facility];  Service: Vascular;  Laterality: Left;  . UPPER EXTREMITY ANGIOGRAPHY Left 02/07/2020   Procedure: UPPER EXTREMITY ANGIOGRAPHY;  Surgeon: [name], MD;  Location: [facility];  Service: Cardiovascular;  Laterality: Left;  . UPPER EXTREMITY VENOGRAPHY Bilateral 09/18/2019   Procedure: UPPER EXTREMITY VENOGRAPHY;  Surgeon: Katha Cabal, MD;  Location: [facility];  Service: Cardiovascular;  Laterality: Bilateral;   Social History Social History   Tobacco Use  . Smoking status: Current Every Day Smoker    Packs/day: 0.50    Years: 32.00    Pack years: 16.00    Types: Cigarettes  . Smokeless tobacco: Never Used  . Tobacco comment:  daily  Vaping Use  . Vaping Use: Never used  Substance Use Topics  . Alcohol use: Yes    Alcohol/week: 7.0 standard drinks    Types: 7 Cans of beer per week    Comment: 3 BEERS PER WEEK  . Drug use: No   Family History Family History  Problem Relation Age of Onset  . Heart disease Father   . Heart disease Brother   . Healthy Sister   . Stroke Neg Hx   No family history of bleeding or clotting disorders, autoimmune disease or porphyria  Allergies  Allergen Reactions  . Lisinopril Hives   REVIEW OF SYSTEMS (Negative unless checked)  Constitutional: [] Weight loss  [] Fever  [] Chills Cardiac: [] Chest pain   [] Chest pressure   [] Palpitations   [] Shortness of breath when laying flat   [] Shortness of breath at rest   [x] Shortness of breath with exertion. Vascular:  [] Pain in legs with walking   [] Pain in legs at rest   [] Pain in legs when laying flat   [] Claudication   [] Pain in feet when walking  [] Pain in  feet at rest  [] Pain in feet when laying flat   [] History of DVT   [] Phlebitis   [x] Swelling in legs   [] Varicose veins   [] Non-healing ulcers Pulmonary:   [] Uses home oxygen   [] Productive cough   [] Hemoptysis   [] Wheeze  [] COPD   [] Asthma Neurologic:  [] Dizziness  [] Blackouts   [] Seizures   [] History of stroke   [] History of TIA  [] Aphasia   [] Temporary blindness   [] Dysphagia   [] Weakness or numbness in arms   [] Weakness or numbness in legs Musculoskeletal:  [x] Arthritis   [] Joint swelling   [] Joint pain   [] Low back pain Hematologic:  [] Easy bruising  [] Easy bleeding   [] Hypercoagulable state   [] Anemic  [] Hepatitis Gastrointestinal:  [] Blood in stool   [] Vomiting blood  [] Gastroesophageal reflux/heartburn   [] Difficulty swallowing. Genitourinary:  [x] Chronic kidney disease   [] Difficult urination  [] Frequent urination  [] Burning with urination   [] Blood in urine Skin:  [] Rashes   [] Ulcers   [] Wounds Psychological:  [] History of anxiety   []  History of major depression.  Physical  Examination  Vitals:   02/21/20 1042 02/21/20 1120  BP: (!) 160/83 (!) 157/91  Pulse: 65 71  Resp: 14 16  Temp: 98.1 F (36.7 C)   TempSrc: Oral   SpO2: 96% 99%   There is no height or weight on file to calculate BMI. Gen: WD/WN, NAD Head: [place]/AT, No temporalis wasting. Prominent temp pulse not noted. Ear/Nose/Throat: Hearing grossly intact, nares w/o erythema or drainage, oropharynx w/o Erythema/Exudate,  Eyes: Conjunctiva clear, sclera non-icteric Neck: Trachea midline.  No JVD.  Pulmonary:  Good air movement, respirations not labored, no use of accessory muscles.  Cardiac: RRR, normal S1, S2. Vascular:  Vessel Right Left  Radial Palpable Palpable  Ulnar Not Palpable Not Palpable  Brachial Palpable Palpable  Carotid Palpable, without bruit Palpable, without bruit   Right IJ PermCath: Intact, no signs of infection  Gastrointestinal: soft, non-tender/non-distended. No guarding/reflex.  Musculoskeletal: M/S 5/5 throughout.  Extremities without ischemic changes.  No deformity or atrophy.  Neurologic: Sensation grossly intact in extremities.  Symmetrical.  Speech is fluent. Motor exam as listed above. Psychiatric: Judgment intact, Mood & affect appropriate for pt's clinical situation. Dermatologic: No rashes or ulcers noted.  No cellulitis or open wounds. Lymph : No Cervical, Axillary, or Inguinal lymphadenopathy.  CBC Lab Results  Component Value Date   WBC 23.5 (H) 01/17/2020   HGB 10.1 (L) 01/17/2020   HCT 28.6 (L) 01/17/2020   MCV 105.1 (H) 01/17/2020   PLT 485 (H) 01/17/2020   BMET    Component Value Date/Time   NA 134 (L) 01/17/2020 0426   NA 132 (L) 10/09/2019 1433   NA 132 (L) 06/24/2014 0903   K 4.5 01/17/2020 0426   K 4.8 07/11/2014 1046   CL 96 (L) 01/17/2020 0426   CL 96 (L) 06/24/2014 0903   CO2 25 01/17/2020 0426   CO2 21 06/24/2014 0903   GLUCOSE 136 (H) 01/17/2020 0426   GLUCOSE 92 06/24/2014 0903   BUN 36 (H) 01/17/2020 0426   BUN 40 (H)  10/09/2019 1433   BUN 60 (H) 06/24/2014 0903   CREATININE 5.78 (H) 01/17/2020 0426   CREATININE 9.10 (H) 06/24/2014 0903   CALCIUM 9.2 01/17/2020 0426   CALCIUM 8.8 06/24/2014 0903   GFRNONAA 10 (L) 01/17/2020 0426   GFRNONAA 7 (L) 06/24/2014 0903   GFRNONAA 11 (L) 01/14/2014 1051   GFRAA 12 (L) 01/17/2020 0426   GFRAA 8 (  L) 06/24/2014 0903   GFRAA 12 (L) 01/14/2014 1051   CrCl cannot be calculated (Patient's most recent lab result is older than the maximum 21 days allowed.).  COAG Lab Results  Component Value Date   INR 1.1 11/29/2019   INR 1.2 09/24/2019   INR 1.5 (H) 07/13/2019   Radiology PERIPHERAL VASCULAR CATHETERIZATION  Result Date: 02/21/2020 See op note  PERIPHERAL VASCULAR CATHETERIZATION  Result Date: 02/08/2020 See op note  VAS [place] (AVF, AVG)  Result Date: 02/04/2020 DIALYSIS ACCESS Reason for Exam: Routine follow up. Access Site: Left Upper Extremity. Access Type: Brachial-cephalic AVF. History: 12/07/2018: New Artegraft with Ligation of the Previous Aneurysmal AVF          in Forearm.          02/01/2019: Jump Graft Anastomosis and PTA's.           11/30/2019: New Left Brachial Cephalic AVF placement. Comparison Study: 12/27/2019 Performing Technologist: Concha Norway RVT  Examination Guidelines: A complete evaluation includes B-mode imaging, spectral Doppler, color Doppler, and power Doppler as needed of all accessible portions of each vessel. Unilateral testing is considered an integral part of a complete examination. Limited examinations for reoccurring indications may be performed as noted.  Findings: +--------------------+----------+-----------------+--------+ AVF                 PSV (cm/s)Flow Vol (mL/min)Comments +--------------------+----------+-----------------+--------+ Native artery inflow   303          1357                +--------------------+----------+-----------------+--------+ AVF Anastomosis        218                               +--------------------+----------+-----------------+--------+  +---------------+----------+-------------+----------+-----------------+ OUTFLOW VEIN   PSV (cm/s)Diameter (cm)Depth (cm)    Describe      +---------------+----------+-------------+----------+-----------------+ Subclavian vein   190                                             +---------------+----------+-------------+----------+-----------------+ Confluence        303                                             +---------------+----------+-------------+----------+-----------------+ Prox UA           230                                             +---------------+----------+-------------+----------+-----------------+ Mid UA            112                                             +---------------+----------+-------------+----------+-----------------+ Dist UA           193                           hematoma 1.1x1.04 +---------------+----------+-------------+----------+-----------------+  +----------------+-------------+----------+---------+----------+---------------+  Diameter (cm)Depth (cm)BranchingPSV (cm/s)  Flow Volume                                                                (ml/min)     +----------------+-------------+----------+---------+----------+---------------+ Left Rad Art                                       127                    distal                                                                    +----------------+-------------+----------+---------+----------+---------------+ Antegrade                                                                 +----------------+-------------+----------+---------+----------+---------------+  Summary: Patent left brachiocephalic with no evidence of significant stenosis. Previous hematoma seen and now measures 1.1 x 1.04cm and has significantly decreased in size compared to previous ultrasound.   *See table(s) above for measurements and observations.  Diagnosing physician: Hortencia Pilar MD Electronically signed by Hortencia Pilar MD on 02/04/2020 at 8:40:35 AM.    --------------------------------------------------------------------------------   Final    Assessment/Plan The patient is a 21-year-old male with known history of end-stage renal disease on chronic hemodialysis who presents today for scheduled PermCath removal  1.  End-stage renal disease requiring hemodialysis: The patient has a functioning dialysis access and is no longer need of his PermCath.  Remove his PermCath.  Procedure, risks and benefits were explained to the patient.  All questions were answered.  Patient is to proceed.  Patient will continue dialysis therapy without further interruption if a successful intervention is not achieved then a tunneled catheter will be placed. Dialysis has already been arranged. 2.  Hypertension:  Patient will continue medical management; nephrology is following no changes in oral medications. 3.  Hyperlipidemia: On aspirin and statin for medical management Encouraged good control as its slows the progression of atherosclerotic disease 4.  Coronary artery disease:  EKG will be monitored. Nitrates will be used if needed. The patient's oral cardiac medications will be continued.  Discussed with Dr. Mayme Genta, PA-C  02/21/2020 11:22 AM

## 2020-02-21 NOTE — Telephone Encounter (Signed)
Daryl Weeks from [facility] called and said that the pt has started to have increased pain since yesterday and that the patient has been taking a lot of tylenol for the pain he is having due to the shunt in his Left arm he was last seen here on 5/17 for increased problems with dialysis  Access and was recommended to have an angio gram to restore the appropriate flow, prevent blood clots and loss of access from what I seen today he is having  The acess removed by Dr. Lucky Cowboy. Ms. Donna Christen wanted to know could we prescribe the patient something for the pain he is having.

## 2020-02-21 NOTE — Telephone Encounter (Signed)
I sent Norco to his pharmacy

## 2020-02-21 NOTE — Op Note (Signed)
Operative Note  Preoperative diagnosis:    1. ESRD with functional permanent access  Postoperative diagnosis:   1. ESRD with functional permanent access  Procedure:  Removal of Right Permcath  Performing Clinician: Hezzie Bump PA-C  Surgeon:  Leotis Pain, MD  Anesthesia:  Local  EBL:  Minimal  Indication for the Procedure:  The patient has a functional permanent dialysis access and no longer needs their permcath.  This can be removed.  Risks and benefits are discussed and informed consent is obtained.  Description of the Procedure:  The patient's right neck, chest and existing catheter were sterilely prepped and draped. The area around the catheter was anesthetized copiously with 1% lidocaine. The catheter was dissected out with curved hemostats until the cuff was freed from the surrounding fibrous sheath. The fiber sheath was transected, and the catheter was then removed in its entirety using gentle traction. Pressure was held and sterile dressings were placed. The patient tolerated the procedure well and was taken to the recovery room in stable condition.  Lenis Nettleton A Zeus Marquis  02/21/2020, 11:23 AM   This note was created with Dragon Medical transcription system. Any errors in dictation are purely unintentional.

## 2020-02-21 NOTE — Telephone Encounter (Signed)
I called the pt and made him aware.

## 2020-02-29 DIAGNOSIS — J441 Chronic obstructive pulmonary disease with (acute) exacerbation: Secondary | ICD-10-CM | POA: Diagnosis not present

## 2020-02-29 DIAGNOSIS — Z992 Dependence on renal dialysis: Secondary | ICD-10-CM | POA: Diagnosis not present

## 2020-02-29 DIAGNOSIS — G4730 Sleep apnea, unspecified: Secondary | ICD-10-CM | POA: Diagnosis not present

## 2020-02-29 DIAGNOSIS — I69991 Dysphagia following unspecified cerebrovascular disease: Secondary | ICD-10-CM | POA: Diagnosis not present

## 2020-02-29 DIAGNOSIS — I132 Hypertensive heart and chronic kidney disease with heart failure and with stage 5 chronic kidney disease, or end stage renal disease: Secondary | ICD-10-CM | POA: Diagnosis not present

## 2020-02-29 DIAGNOSIS — I480 Paroxysmal atrial fibrillation: Secondary | ICD-10-CM | POA: Diagnosis not present

## 2020-02-29 DIAGNOSIS — I69322 Dysarthria following cerebral infarction: Secondary | ICD-10-CM | POA: Diagnosis not present

## 2020-02-29 DIAGNOSIS — F17210 Nicotine dependence, cigarettes, uncomplicated: Secondary | ICD-10-CM | POA: Diagnosis not present

## 2020-02-29 DIAGNOSIS — M069 Rheumatoid arthritis, unspecified: Secondary | ICD-10-CM | POA: Diagnosis not present

## 2020-02-29 DIAGNOSIS — I5032 Chronic diastolic (congestive) heart failure: Secondary | ICD-10-CM | POA: Diagnosis not present

## 2020-02-29 DIAGNOSIS — J9621 Acute and chronic respiratory failure with hypoxia: Secondary | ICD-10-CM | POA: Diagnosis not present

## 2020-02-29 DIAGNOSIS — N028 Recurrent and persistent hematuria with other morphologic changes: Secondary | ICD-10-CM | POA: Diagnosis not present

## 2020-02-29 DIAGNOSIS — R531 Weakness: Secondary | ICD-10-CM | POA: Diagnosis not present

## 2020-02-29 DIAGNOSIS — F419 Anxiety disorder, unspecified: Secondary | ICD-10-CM | POA: Diagnosis not present

## 2020-02-29 DIAGNOSIS — M130 Polyarthritis, unspecified: Secondary | ICD-10-CM | POA: Diagnosis not present

## 2020-02-29 DIAGNOSIS — N186 End stage renal disease: Secondary | ICD-10-CM | POA: Diagnosis not present

## 2020-02-29 DIAGNOSIS — I2510 Atherosclerotic heart disease of native coronary artery without angina pectoris: Secondary | ICD-10-CM | POA: Diagnosis not present

## 2020-02-29 DIAGNOSIS — R1314 Dysphagia, pharyngoesophageal phase: Secondary | ICD-10-CM | POA: Diagnosis not present

## 2020-02-29 DIAGNOSIS — Z743 Need for continuous supervision: Secondary | ICD-10-CM | POA: Diagnosis not present

## 2020-02-29 DIAGNOSIS — D631 Anemia in chronic kidney disease: Secondary | ICD-10-CM | POA: Diagnosis not present

## 2020-03-04 DIAGNOSIS — J441 Chronic obstructive pulmonary disease with (acute) exacerbation: Secondary | ICD-10-CM | POA: Diagnosis not present

## 2020-03-04 DIAGNOSIS — I132 Hypertensive heart and chronic kidney disease with heart failure and with stage 5 chronic kidney disease, or end stage renal disease: Secondary | ICD-10-CM | POA: Diagnosis not present

## 2020-03-04 DIAGNOSIS — I5032 Chronic diastolic (congestive) heart failure: Secondary | ICD-10-CM | POA: Diagnosis not present

## 2020-03-04 DIAGNOSIS — J9621 Acute and chronic respiratory failure with hypoxia: Secondary | ICD-10-CM | POA: Diagnosis not present

## 2020-03-04 DIAGNOSIS — N186 End stage renal disease: Secondary | ICD-10-CM | POA: Diagnosis not present

## 2020-03-04 DIAGNOSIS — D631 Anemia in chronic kidney disease: Secondary | ICD-10-CM | POA: Diagnosis not present

## 2020-03-06 DIAGNOSIS — N186 End stage renal disease: Secondary | ICD-10-CM | POA: Diagnosis not present

## 2020-03-06 DIAGNOSIS — I5032 Chronic diastolic (congestive) heart failure: Secondary | ICD-10-CM | POA: Diagnosis not present

## 2020-03-06 DIAGNOSIS — I132 Hypertensive heart and chronic kidney disease with heart failure and with stage 5 chronic kidney disease, or end stage renal disease: Secondary | ICD-10-CM | POA: Diagnosis not present

## 2020-03-06 DIAGNOSIS — J9621 Acute and chronic respiratory failure with hypoxia: Secondary | ICD-10-CM | POA: Diagnosis not present

## 2020-03-06 DIAGNOSIS — J441 Chronic obstructive pulmonary disease with (acute) exacerbation: Secondary | ICD-10-CM | POA: Diagnosis not present

## 2020-03-06 DIAGNOSIS — D631 Anemia in chronic kidney disease: Secondary | ICD-10-CM | POA: Diagnosis not present

## 2020-03-08 DIAGNOSIS — J9621 Acute and chronic respiratory failure with hypoxia: Secondary | ICD-10-CM | POA: Diagnosis not present

## 2020-03-08 DIAGNOSIS — D631 Anemia in chronic kidney disease: Secondary | ICD-10-CM | POA: Diagnosis not present

## 2020-03-08 DIAGNOSIS — E8779 Other fluid overload: Secondary | ICD-10-CM | POA: Diagnosis not present

## 2020-03-08 DIAGNOSIS — N186 End stage renal disease: Secondary | ICD-10-CM | POA: Diagnosis not present

## 2020-03-08 DIAGNOSIS — J441 Chronic obstructive pulmonary disease with (acute) exacerbation: Secondary | ICD-10-CM | POA: Diagnosis not present

## 2020-03-08 DIAGNOSIS — I132 Hypertensive heart and chronic kidney disease with heart failure and with stage 5 chronic kidney disease, or end stage renal disease: Secondary | ICD-10-CM | POA: Diagnosis not present

## 2020-03-08 DIAGNOSIS — I5032 Chronic diastolic (congestive) heart failure: Secondary | ICD-10-CM | POA: Diagnosis not present

## 2020-03-08 DIAGNOSIS — Z992 Dependence on renal dialysis: Secondary | ICD-10-CM | POA: Diagnosis not present

## 2020-03-11 DIAGNOSIS — D631 Anemia in chronic kidney disease: Secondary | ICD-10-CM | POA: Diagnosis not present

## 2020-03-11 DIAGNOSIS — I5032 Chronic diastolic (congestive) heart failure: Secondary | ICD-10-CM | POA: Diagnosis not present

## 2020-03-11 DIAGNOSIS — J441 Chronic obstructive pulmonary disease with (acute) exacerbation: Secondary | ICD-10-CM | POA: Diagnosis not present

## 2020-03-11 DIAGNOSIS — J9621 Acute and chronic respiratory failure with hypoxia: Secondary | ICD-10-CM | POA: Diagnosis not present

## 2020-03-11 DIAGNOSIS — I132 Hypertensive heart and chronic kidney disease with heart failure and with stage 5 chronic kidney disease, or end stage renal disease: Secondary | ICD-10-CM | POA: Diagnosis not present

## 2020-03-11 DIAGNOSIS — N186 End stage renal disease: Secondary | ICD-10-CM | POA: Diagnosis not present

## 2020-03-13 DIAGNOSIS — N2581 Secondary hyperparathyroidism of renal origin: Secondary | ICD-10-CM | POA: Diagnosis not present

## 2020-03-13 DIAGNOSIS — D631 Anemia in chronic kidney disease: Secondary | ICD-10-CM | POA: Diagnosis not present

## 2020-03-13 DIAGNOSIS — Z992 Dependence on renal dialysis: Secondary | ICD-10-CM | POA: Diagnosis not present

## 2020-03-13 DIAGNOSIS — T82838A Hemorrhage of vascular prosthetic devices, implants and grafts, initial encounter: Secondary | ICD-10-CM | POA: Diagnosis not present

## 2020-03-13 DIAGNOSIS — T82898A Other specified complication of vascular prosthetic devices, implants and grafts, initial encounter: Secondary | ICD-10-CM | POA: Diagnosis not present

## 2020-03-13 DIAGNOSIS — D509 Iron deficiency anemia, unspecified: Secondary | ICD-10-CM | POA: Diagnosis not present

## 2020-03-13 DIAGNOSIS — T82856A Stenosis of peripheral vascular stent, initial encounter: Secondary | ICD-10-CM | POA: Diagnosis not present

## 2020-03-13 DIAGNOSIS — T82848A Pain from vascular prosthetic devices, implants and grafts, initial encounter: Secondary | ICD-10-CM | POA: Diagnosis not present

## 2020-03-13 DIAGNOSIS — I871 Compression of vein: Secondary | ICD-10-CM | POA: Diagnosis not present

## 2020-03-13 DIAGNOSIS — N186 End stage renal disease: Secondary | ICD-10-CM | POA: Diagnosis not present

## 2020-03-14 DIAGNOSIS — N186 End stage renal disease: Secondary | ICD-10-CM | POA: Diagnosis not present

## 2020-03-14 DIAGNOSIS — D631 Anemia in chronic kidney disease: Secondary | ICD-10-CM | POA: Diagnosis not present

## 2020-03-14 DIAGNOSIS — I871 Compression of vein: Secondary | ICD-10-CM | POA: Diagnosis not present

## 2020-03-14 DIAGNOSIS — Z992 Dependence on renal dialysis: Secondary | ICD-10-CM | POA: Diagnosis not present

## 2020-03-14 DIAGNOSIS — N2581 Secondary hyperparathyroidism of renal origin: Secondary | ICD-10-CM | POA: Diagnosis not present

## 2020-03-14 DIAGNOSIS — T82838A Hemorrhage of vascular prosthetic devices, implants and grafts, initial encounter: Secondary | ICD-10-CM | POA: Diagnosis not present

## 2020-03-14 DIAGNOSIS — T82856A Stenosis of peripheral vascular stent, initial encounter: Secondary | ICD-10-CM | POA: Diagnosis not present

## 2020-03-14 DIAGNOSIS — T82898A Other specified complication of vascular prosthetic devices, implants and grafts, initial encounter: Secondary | ICD-10-CM | POA: Diagnosis not present

## 2020-03-14 DIAGNOSIS — T82848A Pain from vascular prosthetic devices, implants and grafts, initial encounter: Secondary | ICD-10-CM | POA: Diagnosis not present

## 2020-03-14 DIAGNOSIS — D509 Iron deficiency anemia, unspecified: Secondary | ICD-10-CM | POA: Diagnosis not present

## 2020-03-17 DIAGNOSIS — Z992 Dependence on renal dialysis: Secondary | ICD-10-CM | POA: Diagnosis not present

## 2020-03-17 DIAGNOSIS — N186 End stage renal disease: Secondary | ICD-10-CM | POA: Diagnosis not present

## 2020-03-17 DIAGNOSIS — D631 Anemia in chronic kidney disease: Secondary | ICD-10-CM | POA: Diagnosis not present

## 2020-03-17 DIAGNOSIS — N2581 Secondary hyperparathyroidism of renal origin: Secondary | ICD-10-CM | POA: Diagnosis not present

## 2020-03-17 DIAGNOSIS — D509 Iron deficiency anemia, unspecified: Secondary | ICD-10-CM | POA: Diagnosis not present

## 2020-03-18 DIAGNOSIS — J9621 Acute and chronic respiratory failure with hypoxia: Secondary | ICD-10-CM | POA: Diagnosis not present

## 2020-03-18 DIAGNOSIS — D631 Anemia in chronic kidney disease: Secondary | ICD-10-CM | POA: Diagnosis not present

## 2020-03-18 DIAGNOSIS — I132 Hypertensive heart and chronic kidney disease with heart failure and with stage 5 chronic kidney disease, or end stage renal disease: Secondary | ICD-10-CM | POA: Diagnosis not present

## 2020-03-18 DIAGNOSIS — I5032 Chronic diastolic (congestive) heart failure: Secondary | ICD-10-CM | POA: Diagnosis not present

## 2020-03-18 DIAGNOSIS — J441 Chronic obstructive pulmonary disease with (acute) exacerbation: Secondary | ICD-10-CM | POA: Diagnosis not present

## 2020-03-18 DIAGNOSIS — N186 End stage renal disease: Secondary | ICD-10-CM | POA: Diagnosis not present

## 2020-03-19 DIAGNOSIS — D631 Anemia in chronic kidney disease: Secondary | ICD-10-CM | POA: Diagnosis not present

## 2020-03-19 DIAGNOSIS — D509 Iron deficiency anemia, unspecified: Secondary | ICD-10-CM | POA: Diagnosis not present

## 2020-03-19 DIAGNOSIS — N186 End stage renal disease: Secondary | ICD-10-CM | POA: Diagnosis not present

## 2020-03-19 DIAGNOSIS — Z992 Dependence on renal dialysis: Secondary | ICD-10-CM | POA: Diagnosis not present

## 2020-03-19 DIAGNOSIS — N2581 Secondary hyperparathyroidism of renal origin: Secondary | ICD-10-CM | POA: Diagnosis not present

## 2020-03-20 ENCOUNTER — Telehealth (INDEPENDENT_AMBULATORY_CARE_PROVIDER_SITE_OTHER): Payer: Self-pay

## 2020-03-20 NOTE — Telephone Encounter (Signed)
Spoke with the patient and he is scheduled with Dr. Lucky Cowboy for a left arm angio on 03/27/20 with a 9:30 am arrival time to the [facility]. Patient will do covid testing on 03/25/20 between 8-1 pm at the [facility]. Pre-procedure instructions were discussed and will be mailed to patient and faxed to his dialysis center per his request.

## 2020-03-21 DIAGNOSIS — D509 Iron deficiency anemia, unspecified: Secondary | ICD-10-CM | POA: Diagnosis not present

## 2020-03-21 DIAGNOSIS — N186 End stage renal disease: Secondary | ICD-10-CM | POA: Diagnosis not present

## 2020-03-21 DIAGNOSIS — N2581 Secondary hyperparathyroidism of renal origin: Secondary | ICD-10-CM | POA: Diagnosis not present

## 2020-03-21 DIAGNOSIS — Z992 Dependence on renal dialysis: Secondary | ICD-10-CM | POA: Diagnosis not present

## 2020-03-21 DIAGNOSIS — D631 Anemia in chronic kidney disease: Secondary | ICD-10-CM | POA: Diagnosis not present

## 2020-03-24 DIAGNOSIS — D631 Anemia in chronic kidney disease: Secondary | ICD-10-CM | POA: Diagnosis not present

## 2020-03-24 DIAGNOSIS — N186 End stage renal disease: Secondary | ICD-10-CM | POA: Diagnosis not present

## 2020-03-24 DIAGNOSIS — N2581 Secondary hyperparathyroidism of renal origin: Secondary | ICD-10-CM | POA: Diagnosis not present

## 2020-03-24 DIAGNOSIS — Z992 Dependence on renal dialysis: Secondary | ICD-10-CM | POA: Diagnosis not present

## 2020-03-24 DIAGNOSIS — D509 Iron deficiency anemia, unspecified: Secondary | ICD-10-CM | POA: Diagnosis not present

## 2020-03-25 ENCOUNTER — Other Ambulatory Visit: Payer: Self-pay

## 2020-03-25 ENCOUNTER — Other Ambulatory Visit
Admission: RE | Admit: 2020-03-25 | Discharge: 2020-03-25 | Disposition: A | Discharge status: Home / Self Care | Payer: Medicare Other | Source: Ambulatory Visit | Attending: Vascular Surgery | Admitting: Vascular Surgery

## 2020-03-25 DIAGNOSIS — Z20822 Contact with and (suspected) exposure to covid-19: Secondary | ICD-10-CM | POA: Insufficient documentation

## 2020-03-25 DIAGNOSIS — Z01812 Encounter for preprocedural laboratory examination: Secondary | ICD-10-CM | POA: Insufficient documentation

## 2020-03-26 ENCOUNTER — Other Ambulatory Visit (INDEPENDENT_AMBULATORY_CARE_PROVIDER_SITE_OTHER): Payer: Self-pay | Admitting: Nurse Practitioner

## 2020-03-26 DIAGNOSIS — D509 Iron deficiency anemia, unspecified: Secondary | ICD-10-CM | POA: Diagnosis not present

## 2020-03-26 DIAGNOSIS — Z992 Dependence on renal dialysis: Secondary | ICD-10-CM | POA: Diagnosis not present

## 2020-03-26 DIAGNOSIS — N2581 Secondary hyperparathyroidism of renal origin: Secondary | ICD-10-CM | POA: Diagnosis not present

## 2020-03-26 DIAGNOSIS — N186 End stage renal disease: Secondary | ICD-10-CM | POA: Diagnosis not present

## 2020-03-26 DIAGNOSIS — D631 Anemia in chronic kidney disease: Secondary | ICD-10-CM | POA: Diagnosis not present

## 2020-03-26 LAB — SARS CORONAVIRUS 2 (TAT 6-24 HRS): SARS Coronavirus 2: NEGATIVE

## 2020-03-27 MED ORDER — CEFAZOLIN SODIUM-DEXTROSE 1-4 GM/50ML-% IV SOLN: 1.0000 g | Freq: Once | INTRAVENOUS | Status: AC

## 2020-03-28 ENCOUNTER — Telehealth (INDEPENDENT_AMBULATORY_CARE_PROVIDER_SITE_OTHER): Payer: Self-pay

## 2020-03-28 DIAGNOSIS — J9621 Acute and chronic respiratory failure with hypoxia: Secondary | ICD-10-CM | POA: Diagnosis not present

## 2020-03-28 DIAGNOSIS — Z992 Dependence on renal dialysis: Secondary | ICD-10-CM | POA: Diagnosis not present

## 2020-03-28 DIAGNOSIS — D631 Anemia in chronic kidney disease: Secondary | ICD-10-CM | POA: Diagnosis not present

## 2020-03-28 DIAGNOSIS — N186 End stage renal disease: Secondary | ICD-10-CM | POA: Diagnosis not present

## 2020-03-28 DIAGNOSIS — I5032 Chronic diastolic (congestive) heart failure: Secondary | ICD-10-CM | POA: Diagnosis not present

## 2020-03-28 DIAGNOSIS — J441 Chronic obstructive pulmonary disease with (acute) exacerbation: Secondary | ICD-10-CM | POA: Diagnosis not present

## 2020-03-28 DIAGNOSIS — N2581 Secondary hyperparathyroidism of renal origin: Secondary | ICD-10-CM | POA: Diagnosis not present

## 2020-03-28 DIAGNOSIS — D509 Iron deficiency anemia, unspecified: Secondary | ICD-10-CM | POA: Diagnosis not present

## 2020-03-28 DIAGNOSIS — I132 Hypertensive heart and chronic kidney disease with heart failure and with stage 5 chronic kidney disease, or end stage renal disease: Secondary | ICD-10-CM | POA: Diagnosis not present

## 2020-03-28 NOTE — Telephone Encounter (Signed)
Spoke with Lattie Haw at [facility] regarding rescheduling the patient. Patient is scheduled with Dew for 04/03/20 with a 9:00 am arrival time to the [facility]. Covid testing on 04/01/20 between 8-1 pm at the [facility]. Pre-procedure instructions were discussed and will be mailed and faxed to [facility].

## 2020-03-30 DIAGNOSIS — I132 Hypertensive heart and chronic kidney disease with heart failure and with stage 5 chronic kidney disease, or end stage renal disease: Secondary | ICD-10-CM | POA: Diagnosis not present

## 2020-03-30 DIAGNOSIS — I2510 Atherosclerotic heart disease of native coronary artery without angina pectoris: Secondary | ICD-10-CM | POA: Diagnosis not present

## 2020-03-30 DIAGNOSIS — I5032 Chronic diastolic (congestive) heart failure: Secondary | ICD-10-CM | POA: Diagnosis not present

## 2020-03-30 DIAGNOSIS — J441 Chronic obstructive pulmonary disease with (acute) exacerbation: Secondary | ICD-10-CM | POA: Diagnosis not present

## 2020-03-30 DIAGNOSIS — Z992 Dependence on renal dialysis: Secondary | ICD-10-CM | POA: Diagnosis not present

## 2020-03-30 DIAGNOSIS — N186 End stage renal disease: Secondary | ICD-10-CM | POA: Diagnosis not present

## 2020-03-30 DIAGNOSIS — I69391 Dysphagia following cerebral infarction: Secondary | ICD-10-CM | POA: Diagnosis not present

## 2020-03-30 DIAGNOSIS — D631 Anemia in chronic kidney disease: Secondary | ICD-10-CM | POA: Diagnosis not present

## 2020-03-30 DIAGNOSIS — J9621 Acute and chronic respiratory failure with hypoxia: Secondary | ICD-10-CM | POA: Diagnosis not present

## 2020-03-30 DIAGNOSIS — I480 Paroxysmal atrial fibrillation: Secondary | ICD-10-CM | POA: Diagnosis not present

## 2020-03-30 DIAGNOSIS — R531 Weakness: Secondary | ICD-10-CM | POA: Diagnosis not present

## 2020-03-30 DIAGNOSIS — I69322 Dysarthria following cerebral infarction: Secondary | ICD-10-CM | POA: Diagnosis not present

## 2020-03-30 DIAGNOSIS — F17210 Nicotine dependence, cigarettes, uncomplicated: Secondary | ICD-10-CM | POA: Diagnosis not present

## 2020-03-30 DIAGNOSIS — N028 Recurrent and persistent hematuria with other morphologic changes: Secondary | ICD-10-CM | POA: Diagnosis not present

## 2020-03-30 DIAGNOSIS — G4730 Sleep apnea, unspecified: Secondary | ICD-10-CM | POA: Diagnosis not present

## 2020-03-30 DIAGNOSIS — R1314 Dysphagia, pharyngoesophageal phase: Secondary | ICD-10-CM | POA: Diagnosis not present

## 2020-03-30 DIAGNOSIS — Z743 Need for continuous supervision: Secondary | ICD-10-CM | POA: Diagnosis not present

## 2020-03-30 DIAGNOSIS — F419 Anxiety disorder, unspecified: Secondary | ICD-10-CM | POA: Diagnosis not present

## 2020-03-30 DIAGNOSIS — M069 Rheumatoid arthritis, unspecified: Secondary | ICD-10-CM | POA: Diagnosis not present

## 2020-03-30 DIAGNOSIS — M130 Polyarthritis, unspecified: Secondary | ICD-10-CM | POA: Diagnosis not present

## 2020-03-31 DIAGNOSIS — D631 Anemia in chronic kidney disease: Secondary | ICD-10-CM | POA: Diagnosis not present

## 2020-03-31 DIAGNOSIS — N186 End stage renal disease: Secondary | ICD-10-CM | POA: Diagnosis not present

## 2020-03-31 DIAGNOSIS — D509 Iron deficiency anemia, unspecified: Secondary | ICD-10-CM | POA: Diagnosis not present

## 2020-03-31 DIAGNOSIS — Z992 Dependence on renal dialysis: Secondary | ICD-10-CM | POA: Diagnosis not present

## 2020-03-31 DIAGNOSIS — N2581 Secondary hyperparathyroidism of renal origin: Secondary | ICD-10-CM | POA: Diagnosis not present

## 2020-04-01 ENCOUNTER — Other Ambulatory Visit
Admission: RE | Admit: 2020-04-01 | Discharge: 2020-04-01 | Disposition: A | Discharge status: Home / Self Care | Payer: Medicare Other | Source: Ambulatory Visit | Attending: Vascular Surgery | Admitting: Vascular Surgery

## 2020-04-01 ENCOUNTER — Other Ambulatory Visit: Payer: Self-pay

## 2020-04-01 DIAGNOSIS — Z01812 Encounter for preprocedural laboratory examination: Secondary | ICD-10-CM | POA: Diagnosis not present

## 2020-04-01 DIAGNOSIS — Z20822 Contact with and (suspected) exposure to covid-19: Secondary | ICD-10-CM | POA: Insufficient documentation

## 2020-04-01 LAB — SARS CORONAVIRUS 2 (TAT 6-24 HRS): SARS Coronavirus 2: NEGATIVE

## 2020-04-02 DIAGNOSIS — N186 End stage renal disease: Secondary | ICD-10-CM | POA: Diagnosis not present

## 2020-04-02 DIAGNOSIS — D631 Anemia in chronic kidney disease: Secondary | ICD-10-CM | POA: Diagnosis not present

## 2020-04-02 DIAGNOSIS — N2581 Secondary hyperparathyroidism of renal origin: Secondary | ICD-10-CM | POA: Diagnosis not present

## 2020-04-02 DIAGNOSIS — Z992 Dependence on renal dialysis: Secondary | ICD-10-CM | POA: Diagnosis not present

## 2020-04-02 DIAGNOSIS — D509 Iron deficiency anemia, unspecified: Secondary | ICD-10-CM | POA: Diagnosis not present

## 2020-04-03 ENCOUNTER — Encounter: Payer: Self-pay | Admitting: Vascular Surgery

## 2020-04-03 ENCOUNTER — Ambulatory Visit
Admission: RE | Admit: 2020-04-03 | Discharge: 2020-04-03 | Disposition: A | Discharge status: Home / Self Care | Payer: Medicare Other | Attending: Vascular Surgery | Admitting: Vascular Surgery

## 2020-04-03 ENCOUNTER — Encounter
Admission: RE | Disposition: A | Discharge status: Home / Self Care | Payer: Self-pay | Source: Home / Self Care | Attending: Vascular Surgery

## 2020-04-03 ENCOUNTER — Other Ambulatory Visit: Payer: Self-pay

## 2020-04-03 DIAGNOSIS — T82848A Pain from vascular prosthetic devices, implants and grafts, initial encounter: Secondary | ICD-10-CM | POA: Diagnosis not present

## 2020-04-03 DIAGNOSIS — N186 End stage renal disease: Secondary | ICD-10-CM | POA: Insufficient documentation

## 2020-04-03 DIAGNOSIS — I252 Old myocardial infarction: Secondary | ICD-10-CM | POA: Diagnosis not present

## 2020-04-03 DIAGNOSIS — E785 Hyperlipidemia, unspecified: Secondary | ICD-10-CM | POA: Diagnosis not present

## 2020-04-03 DIAGNOSIS — Y841 Kidney dialysis as the cause of abnormal reaction of the patient, or of later complication, without mention of misadventure at the time of the procedure: Secondary | ICD-10-CM | POA: Insufficient documentation

## 2020-04-03 DIAGNOSIS — K219 Gastro-esophageal reflux disease without esophagitis: Secondary | ICD-10-CM | POA: Insufficient documentation

## 2020-04-03 DIAGNOSIS — Y832 Surgical operation with anastomosis, bypass or graft as the cause of abnormal reaction of the patient, or of later complication, without mention of misadventure at the time of the procedure: Secondary | ICD-10-CM | POA: Diagnosis not present

## 2020-04-03 DIAGNOSIS — I7789 Other specified disorders of arteries and arterioles: Secondary | ICD-10-CM

## 2020-04-03 DIAGNOSIS — T82898A Other specified complication of vascular prosthetic devices, implants and grafts, initial encounter: Secondary | ICD-10-CM | POA: Diagnosis not present

## 2020-04-03 DIAGNOSIS — F419 Anxiety disorder, unspecified: Secondary | ICD-10-CM | POA: Insufficient documentation

## 2020-04-03 DIAGNOSIS — M069 Rheumatoid arthritis, unspecified: Secondary | ICD-10-CM | POA: Diagnosis not present

## 2020-04-03 DIAGNOSIS — G4730 Sleep apnea, unspecified: Secondary | ICD-10-CM | POA: Diagnosis not present

## 2020-04-03 DIAGNOSIS — J449 Chronic obstructive pulmonary disease, unspecified: Secondary | ICD-10-CM | POA: Insufficient documentation

## 2020-04-03 DIAGNOSIS — R200 Anesthesia of skin: Secondary | ICD-10-CM | POA: Insufficient documentation

## 2020-04-03 DIAGNOSIS — Z888 Allergy status to other drugs, medicaments and biological substances status: Secondary | ICD-10-CM | POA: Insufficient documentation

## 2020-04-03 DIAGNOSIS — F17210 Nicotine dependence, cigarettes, uncomplicated: Secondary | ICD-10-CM | POA: Diagnosis not present

## 2020-04-03 DIAGNOSIS — Z992 Dependence on renal dialysis: Secondary | ICD-10-CM | POA: Insufficient documentation

## 2020-04-03 DIAGNOSIS — I2510 Atherosclerotic heart disease of native coronary artery without angina pectoris: Secondary | ICD-10-CM | POA: Insufficient documentation

## 2020-04-03 DIAGNOSIS — I132 Hypertensive heart and chronic kidney disease with heart failure and with stage 5 chronic kidney disease, or end stage renal disease: Secondary | ICD-10-CM | POA: Insufficient documentation

## 2020-04-03 DIAGNOSIS — I480 Paroxysmal atrial fibrillation: Secondary | ICD-10-CM | POA: Insufficient documentation

## 2020-04-03 DIAGNOSIS — I5032 Chronic diastolic (congestive) heart failure: Secondary | ICD-10-CM | POA: Insufficient documentation

## 2020-04-03 HISTORY — PX: UPPER EXTREMITY ANGIOGRAPHY: CATH118270

## 2020-04-03 LAB — POTASSIUM (ARMC VASCULAR LAB ONLY): Potassium (ARMC vascular lab): 5.2 — ABNORMAL HIGH (ref 3.5–5.1)

## 2020-04-03 SURGERY — UPPER EXTREMITY ANGIOGRAPHY
Anesthesia: Moderate Sedation | Laterality: Left

## 2020-04-03 MED ORDER — METHYLPREDNISOLONE SODIUM SUCC 125 MG IJ SOLR: 125.0000 mg | Freq: Once | INTRAMUSCULAR | Status: DC | PRN

## 2020-04-03 MED ORDER — MIDAZOLAM HCL 2 MG/2ML IJ SOLN
INTRAMUSCULAR | Status: DC | PRN
  Administered 2020-04-03: 1 mg via INTRAVENOUS

## 2020-04-03 MED ORDER — ONDANSETRON HCL 4 MG/2ML IJ SOLN: 4.0000 mg | Freq: Four times a day (QID) | INTRAMUSCULAR | Status: DC | PRN

## 2020-04-03 MED ORDER — HEPARIN SODIUM (PORCINE) 1000 UNIT/ML IJ SOLN
INTRAMUSCULAR | Status: AC
  Filled 2020-04-03: qty 1

## 2020-04-03 MED ORDER — FAMOTIDINE 20 MG PO TABS: 40.0000 mg | ORAL_TABLET | Freq: Once | ORAL | Status: DC | PRN

## 2020-04-03 MED ORDER — SODIUM CHLORIDE 0.9 % IV SOLN: INTRAVENOUS | Status: DC

## 2020-04-03 MED ORDER — MIDAZOLAM HCL 5 MG/5ML IJ SOLN
INTRAMUSCULAR | Status: AC
  Filled 2020-04-03: qty 5

## 2020-04-03 MED ORDER — CEFAZOLIN SODIUM-DEXTROSE 1-4 GM/50ML-% IV SOLN
INTRAVENOUS | Status: AC
  Filled 2020-04-03: qty 50

## 2020-04-03 MED ORDER — IODIXANOL 320 MG/ML IV SOLN
INTRAVENOUS | Status: DC | PRN
  Administered 2020-04-03: 35 mL

## 2020-04-03 MED ORDER — HYDROMORPHONE HCL 1 MG/ML IJ SOLN
1.0000 mg | Freq: Once | INTRAMUSCULAR | Status: AC | PRN
  Administered 2020-04-03: 1 mg via INTRAVENOUS

## 2020-04-03 MED ORDER — DIPHENHYDRAMINE HCL 50 MG/ML IJ SOLN: 50.0000 mg | Freq: Once | INTRAMUSCULAR | Status: DC | PRN

## 2020-04-03 MED ORDER — FENTANYL CITRATE (PF) 100 MCG/2ML IJ SOLN
INTRAMUSCULAR | Status: AC
  Filled 2020-04-03: qty 2

## 2020-04-03 MED ORDER — HEPARIN SODIUM (PORCINE) 1000 UNIT/ML IJ SOLN
INTRAMUSCULAR | Status: DC | PRN
  Administered 2020-04-03: 3000 [IU] via INTRAVENOUS

## 2020-04-03 MED ORDER — MIDAZOLAM HCL 2 MG/ML PO SYRP: 8.0000 mg | ORAL_SOLUTION | Freq: Once | ORAL | Status: DC | PRN

## 2020-04-03 MED ORDER — FENTANYL CITRATE (PF) 100 MCG/2ML IJ SOLN
INTRAMUSCULAR | Status: DC | PRN
  Administered 2020-04-03: 50 ug via INTRAVENOUS

## 2020-04-03 MED ORDER — HYDROMORPHONE HCL 1 MG/ML IJ SOLN
INTRAMUSCULAR | Status: AC
  Filled 2020-04-03: qty 1

## 2020-04-03 SURGICAL SUPPLY — 15 items
BALLN ULTRVRSE 018 2.5X150X150 (BALLOONS) ×3
BALLOON ULTRVS 018 2.5X150X150 (BALLOONS) ×1 IMPLANT
CATH ANGIO 5F PIGTAIL 100CM (CATHETERS) ×3 IMPLANT
CATH BEACON 5 .035 100 H1 TIP (CATHETERS) ×3 IMPLANT
CATH CXI SUPP ANG 4FR 135 (CATHETERS) ×1 IMPLANT
CATH CXI SUPP ANG 4FR 135CM (CATHETERS) ×3
DEVICE PRESTO INFLATION (MISCELLANEOUS) ×3 IMPLANT
DEVICE STARCLOSE SE CLOSURE (Vascular Products) ×3 IMPLANT
GLIDEWIRE ANGLED SS 035X260CM (WIRE) ×3 IMPLANT
PACK ANGIOGRAPHY (CUSTOM PROCEDURE TRAY) ×3 IMPLANT
SHEATH BRITE TIP 5FRX11 (SHEATH) ×3 IMPLANT
SYR MEDRAD MARK 7 150ML (SYRINGE) ×3 IMPLANT
TUBING CONTRAST HIGH PRESS 72 (TUBING) ×3 IMPLANT
WIRE G V18X300CM (WIRE) ×3 IMPLANT
WIRE J 3MM .035X145CM (WIRE) ×3 IMPLANT

## 2020-04-03 NOTE — Op Note (Signed)
OPERATIVE REPORT   PREOPERATIVE DIAGNOSIS: 1. End-stage renal disease. 2. Steal syndrome, Left arm with patent brachiocephalic AVF.  POSTOPERATIVE DIAGNOSIS: Same as above  PROCEDURE PERFORMED: 1. Ultrasound guidance vascular access to right femoral artery. 2. Catheter placement to left radial artery and left ulnar arteries  from right femoral approach. 3. Thoracic aortogram and selective left upper extremity angiogram  including selective images of the radial and ulnar arteries. 4. Percutaneous transluminal angioplasty of left ulnar artery with 2.5 mm diameter angioplasty balloon 5. StarClose closure device right femoral artery.  SURGEON: [name], MD  ANESTHESIA: Local with moderate conscious sedation for 25 minutes using 1 mg of Versed and 50 mcg of Fentanyl  BLOOD LOSS: Minimal.  FLUOROSCOPY TIME:5.2 minutes  INDICATION FOR PROCEDURE: This is a 59 y.o.male who presented to our office with steal syndrome. The patient's left brachiocephalic AV fistula is working well, but their hand is numb and painful. To further evaluate this to determine what options would be possible to treat the steal syndrome, angiogram of the left upper extremity is indicated. Risks and benefits are discussed. Informed consent was obtained.  DESCRIPTION OF PROCEDURE: The patient was brought to the vascular suite. Moderate conscious sedation was administered during a face to face encounter with the patient throughout the procedure with my supervision of the RN administering medicines and monitoring the patient's vital signs, pulse oximetry, telemetry and mental status throughout from the start of the procedure until the patient was taken to the recovery room.  Groins were shaved and prepped and sterile surgical field was created. The right femoral head was localized with fluoroscopy and the right femoral artery was then visualized with ultrasound and found to be widely  patent. It was then accessed under direct ultrasound guidance without difficulty with a Seldinger needle and a permanent image was recorded. A J-wire and 5-French sheath were then placed. Pigtail catheter was placed into the ascending aorta and a thoracic aortogram was then performed in the LAO projection. This demonstrated normal origins to the great vessels without significant proximal stenoses and a normal configuration of the great vessels. The patient was given 3000 units of intravenous heparin and a headhunter catheter was used to selectively cannulate the left subclavian artery without difficulty. This was then sequentially advanced to the brachial artery.  Steal was demonstrated with all of the flow from the artery going into the fistula and not downstream to the hand on images with the catheter proximal to the access. I then advanced a CXI catheter beyond the access and into the radial and ulnar arteries.  The radial artery was entered first and imaging showed a diseased radial artery with occluded segment at and beyond his old access site.  Some collateral filling into the hand was seen, but the distal target was poor for intervention. After this, the catheter was removed and placed into the ulnar artery, this was evaluated.This was a small diffusely diseased vessel with multiple areas of greater than 70% stenosis but was continuous into the hand. I then placed a V 18 wire and remove the CXI catheter parking the wire in the hand. 2 inflations with a 2.5 mm diameter by 15 cm length angioplasty wound were performed on the left ulnar artery. The first was in the wrist and distal forearm and the second was in the mid and proximal forearm. The distal inflation was 6 atm for 1 minute. The proximal inflation was 12 atm for 1 minute. Ablation imaging actually showed marked improvement with  less than 30% residual stenosis although the vessel remains small but there is now inline flow to the hand.  With the large patent fistula, there is still some degree of steal, but hopefully this will improve his hand pain. If not, banding of his fistula would be a good option to try to reduce the steal syndrome. The diagnostic catheter was removed. Oblique arteriogram was performed of the right femoral artery and StarClose closure device deployed in the usual fashion with excellent hemostatic result. The patient tolerated the procedure well and was taken to the recovery room in stable condition.   Jason Dew 04/03/2020 11:48 AM 

## 2020-04-03 NOTE — Discharge Instructions (Signed)
Angiogram, Care After This sheet gives you information about how to care for yourself after your procedure. Your doctor may also give you more specific instructions. If you have problems or questions, contact your doctor. Follow these instructions at home: Insertion site care  Follow instructions from your doctor about how to take care of your long, thin tube (catheter) insertion area. Make sure you: ? Wash your hands with soap and water before you change your bandage (dressing). If you cannot use soap and water, use hand sanitizer. ? Change your bandage as told by your doctor. ? Leave stitches (sutures), skin glue, or skin tape (adhesive) strips in place. They may need to stay in place for 2 weeks or longer. If tape strips get loose and curl up, you may trim the loose edges. Do not remove tape strips completely unless your doctor says it is okay.  Do not take baths, swim, or use a hot tub until your doctor says it is okay.  You may shower 24-48 hours after the procedure or as told by your doctor. ? Gently wash the area with plain soap and water. ? Pat the area dry with a clean towel. ? Do not rub the area. This may cause bleeding.  Do not apply powder or lotion to the area. Keep the area clean and dry.  Check your insertion area every day for signs of infection. Check for: ? More redness, swelling, or pain. ? Fluid or blood. ? Warmth. ? Pus or a bad smell. Activity  Rest as told by your doctor, usually for 1-2 days.  Do not lift anything that is heavier than 10 lbs. (4.5 kg) or as told by your doctor.  Do not drive for 24 hours if you were given a medicine to help you relax (sedative).  Do not drive or use heavy machinery while taking prescription pain medicine. General instructions   Go back to your normal activities as told by your doctor, usually in about a week. Ask your doctor what activities are safe for you.  If the insertion area starts to bleed, lie flat and put  pressure on the area. If the bleeding does not stop, get help right away. This is an emergency.  Drink enough fluid to keep your pee (urine) clear or pale yellow.  Take over-the-counter and prescription medicines only as told by your doctor.  Keep all follow-up visits as told by your doctor. This is important. Contact a doctor if:  You have a fever.  You have chills.  You have more redness, swelling, or pain around your insertion area.  You have fluid or blood coming from your insertion area.  The insertion area feels warm to the touch.  You have pus or a bad smell coming from your insertion area.  You have more bruising around the insertion area.  Blood collects in the tissue around the insertion area (hematoma) that may be painful to the touch. Get help right away if:  You have a lot of pain in the insertion area.  The insertion area swells very fast.  The insertion area is bleeding, and the bleeding does not stop after holding steady pressure on the area.  The area near or just beyond the insertion area becomes pale, cool, tingly, or numb. These symptoms may be an emergency. Do not wait to see if the symptoms will go away. Get medical help right away. Call your local emergency services (911 in the [place] Do not drive yourself to the hospital.   Summary  After the procedure, it is common to have bruising and tenderness at the long, thin tube insertion area.  After the procedure, it is important to rest and drink plenty of fluids.  Do not take baths, swim, or use a hot tub until your doctor says it is okay to do so. You may shower 24-48 hours after the procedure or as told by your doctor.  If the insertion area starts to bleed, lie flat and put pressure on the area. If the bleeding does not stop, get help right away. This is an emergency. This information is not intended to replace advice given to you by your health care provider. Make sure you discuss any questions you have  with your health care provider. Document Revised: 08/12/2017 Document Reviewed: 08/24/2016 Elsevier Patient Education  2020 Elsevier Inc.   Femoral Site Care This sheet gives you information about how to care for yourself after your procedure. Your health care provider may also give you more specific instructions. If you have problems or questions, contact your health care provider. What can I expect after the procedure? After the procedure, it is common to have:  Bruising that usually fades within 1-2 weeks.  Tenderness at the site. Follow these instructions at home: Wound care  Follow instructions from your health care provider about how to take care of your insertion site. Make sure you: ? Wash your hands with soap and water before you change your bandage (dressing). If soap and water are not available, use hand sanitizer. ? Change your dressing as told by your health care provider. ? Leave stitches (sutures), skin glue, or adhesive strips in place. These skin closures may need to stay in place for 2 weeks or longer. If adhesive strip edges start to loosen and curl up, you may trim the loose edges. Do not remove adhesive strips completely unless your health care provider tells you to do that.  Do not take baths, swim, or use a hot tub until your health care provider approves.  You may shower 24-48 hours after the procedure or as told by your health care provider. ? Gently wash the site with plain soap and water. ? Pat the area dry with a clean towel. ? Do not rub the site. This may cause bleeding.  Do not apply powder or lotion to the site. Keep the site clean and dry.  Check your femoral site every day for signs of infection. Check for: ? Redness, swelling, or pain. ? Fluid or blood. ? Warmth. ? Pus or a bad smell. Activity  For the first 2-3 days after your procedure, or as long as directed: ? Avoid climbing stairs as much as possible. ? Do not squat.  Do not lift  anything that is heavier than 10 lb (4.5 kg), or the limit that you are told, until your health care provider says that it is safe.  Rest as directed. ? Avoid sitting for a long time without moving. Get up to take short walks every 1-2 hours.  Do not drive for 24 hours if you were given a medicine to help you relax (sedative). General instructions  Take over-the-counter and prescription medicines only as told by your health care provider.  Keep all follow-up visits as told by your health care provider. This is important. Contact a health care provider if you have:  A fever or chills.  You have redness, swelling, or pain around your insertion site. Get help right away if:  The catheter   insertion area swells very fast.  You pass out.  You suddenly start to sweat or your skin gets clammy.  The catheter insertion area is bleeding, and the bleeding does not stop when you hold steady pressure on the area.  The area near or just beyond the catheter insertion site becomes pale, cool, tingly, or numb. These symptoms may represent a serious problem that is an emergency. Do not wait to see if the symptoms will go away. Get medical help right away. Call your local emergency services (911 in the [place] Do not drive yourself to the hospital. Summary  After the procedure, it is common to have bruising that usually fades within 1-2 weeks.  Check your femoral site every day for signs of infection.  Do not lift anything that is heavier than 10 lb (4.5 kg), or the limit that you are told, until your health care provider says that it is safe. This information is not intended to replace advice given to you by your health care provider. Make sure you discuss any questions you have with your health care provider. Document Revised: 09/12/2017 Document Reviewed: 09/12/2017 Elsevier Patient Education  2020 Elsevier Inc.   Moderate Conscious Sedation, Adult, Care After These instructions provide you  with information about caring for yourself after your procedure. Your health care provider may also give you more specific instructions. Your treatment has been planned according to current medical practices, but problems sometimes occur. Call your health care provider if you have any problems or questions after your procedure. What can I expect after the procedure? After your procedure, it is common:  To feel sleepy for several hours.  To feel clumsy and have poor balance for several hours.  To have poor judgment for several hours.  To vomit if you eat too soon. Follow these instructions at home: For at least 24 hours after the procedure:   Do not: ? Participate in activities where you could fall or become injured. ? Drive. ? Use heavy machinery. ? Drink alcohol. ? Take sleeping pills or medicines that cause drowsiness. ? Make important decisions or sign legal documents. ? Take care of children on your own.  Rest. Eating and drinking  Follow the diet recommended by your health care provider.  If you vomit: ? Drink water, juice, or soup when you can drink without vomiting. ? Make sure you have little or no nausea before eating solid foods. General instructions  Have a responsible adult stay with you until you are awake and alert.  Take over-the-counter and prescription medicines only as told by your health care provider.  If you smoke, do not smoke without supervision.  Keep all follow-up visits as told by your health care provider. This is important. Contact a health care provider if:  You keep feeling nauseous or you keep vomiting.  You feel light-headed.  You develop a rash.  You have a fever. Get help right away if:  You have trouble breathing. This information is not intended to replace advice given to you by your health care provider. Make sure you discuss any questions you have with your health care provider. Document Revised: 08/12/2017 Document Reviewed:  12/20/2015 Elsevier Patient Education  2020 Elsevier Inc.  

## 2020-04-03 NOTE — H&P (Signed)
[facility] Admission History & Physical  MRN : [id number]  Daryl Weeks is a 59 y.o. (01/30/1961) male who presents with chief complaint of scheduled left upper extremity angiogram.  History of Present Illness: The patient is a 59 year old male with multiple medical issues including known history of end-stage renal disease currently maintained on hemodialysis.  Patient has a functioning left brachiocephalic AV fistula however experiences left hand pain and numbness.  The symptoms worsen with dialysis.  He denies any ulcer formation to the left hand.  He denies any fever, nausea vomiting.  Denies any shortness of breath or chest pain.  Patient presents for a scheduled left upper extremity angiogram to evaluate possibly treat for steal syndrome.  Current Facility-Administered Medications  Medication Dose Route Frequency Provider Last Rate Last Admin  . ceFAZolin (ANCEF) 1-4 GM/50ML-% IVPB           . 0.9 %  sodium chloride infusion   Intravenous Continuous Kris Hartmann, NP      . ceFAZolin (ANCEF) IVPB 1 g/50 mL premix  1 g Intravenous Once Kris Hartmann, NP      . diphenhydrAMINE (BENADRYL) injection 50 mg  50 mg Intravenous Once PRN Kris Hartmann, NP      . famotidine (PEPCID) tablet 40 mg  40 mg Oral Once PRN Kris Hartmann, NP      . HYDROmorphone (DILAUDID) injection 1 mg  1 mg Intravenous Once PRN Eulogio Ditch E, NP      . methylPREDNISolone sodium succinate (SOLU-MEDROL) 125 mg/2 mL injection 125 mg  125 mg Intravenous Once PRN Eulogio Ditch E, NP      . midazolam (VERSED) 2 MG/ML syrup 8 mg  8 mg Oral Once PRN Kris Hartmann, NP      . ondansetron (ZOFRAN) injection 4 mg  4 mg Intravenous Q6H PRN Kris Hartmann, NP       Past Medical History:  Diagnosis Date  . (HFpEF) heart failure with preserved ejection fraction ([place])    a. 05/2019 Echo: EF 50-55%, diast dysfxn, RVSP 56.34mmHg, Sev dil LA. Mildly dil PA.  [name] Anemia   . Anxiety   . Arthritis    . Colon polyps   . COPD (chronic obstructive pulmonary disease) ([place])   . Coronary artery disease    a. 10/2013 NSTEMI/Cath: Severe 3VD-->CABG x 4 @ [facility] 01/2014 (LIMA->LAD, VG->OM1->OM2, VG->RCA); b. 11/2014 MV: No isch/infarct; c. 05/2019 MV: EF 40% (50-55 by echo), small,mild, fixed apical defect - ? atten vs infarct. No ischemia->low risk.  . ESRD (end stage renal disease) ([place])    a. MWFSat HD  . ETOH abuse    a. 2 beers/night.  [name] GERD (gastroesophageal reflux disease)   . History of pneumonia   . Hyperlipidemia   . Hypertension   . IgA nephropathy   . Lower GI bleed    a. Due to colon polyps. Status post resection of 14 polyps  . Non-ST elevation MI (NSTEMI) ([place]) 2016  . PAF (paroxysmal atrial fibrillation) ([place])    a. Dx 05/2019. CHA2DS2VASc = 3-->Eliquis/Amio.  . Pneumonia 08/2019  . Rheumatoid arthritis ([place])   . Shortness of breath    WITH EXERTION  . Sleep apnea   . Tobacco abuse    Past Surgical History:  Procedure Laterality Date  . A/V FISTULAGRAM Left 10/28/2016   Procedure: A/V Fistulagram;  Surgeon: [name], MD;  Location: [facility];  Service: Cardiovascular;  Laterality: Left;  .  A/V FISTULAGRAM Left 12/07/2018   Procedure: A/V FISTULAGRAM;  Surgeon: [name], MD;  Location: [facility];  Service: Cardiovascular;  Laterality: Left;  . A/V FISTULAGRAM N/A 06/04/2019   Procedure: A/V Fistulagram- LEFT;  Surgeon: [name], MD;  Location: [facility];  Service: Cardiovascular;  Laterality: N/A;  . A/V SHUNT INTERVENTION N/A 10/28/2016   Procedure: A/V Shunt Intervention;  Surgeon: [name], MD;  Location: [facility];  Service: Cardiovascular;  Laterality: N/A;  . A/V SHUNTOGRAM Left 02/01/2019   Procedure: A/V SHUNTOGRAM;  Surgeon: [name], MD;  Location: [facility];  Service: Cardiovascular;  Laterality: Left;  . AV FISTULA PLACEMENT    . AV FISTULA PLACEMENT Left 11/30/2019   Procedure: ARTERIOVENOUS  (AV) FISTULA CREATION ( BRACHIAL CEPHALIC );  Surgeon: Katha Cabal, MD;  Location: [facility];  Service: Vascular;  Laterality: Left;  . CARDIAC CATHETERIZATION     RCA 90% and calcified mid LAD 80% Stenosis  . CORONARY ARTERY BYPASS GRAFT N/A 11/29/2013   Procedure: CORONARY ARTERY BYPASS GRAFTING (CABG) x 4 using endoscopically harvested right saphenous vein and left internal mammary artery;  Surgeon: [name], MD;  Location: [facility];  Service: Open Heart Surgery;  Laterality: N/A;  . dialysis catheterr  2/15  . DIALYSIS/PERMA CATHETER INSERTION N/A 07/16/2019   Procedure: DIALYSIS/PERMA CATHETER INSERTION WITH VAC CHANGE UNDER SEDATION;  Surgeon: [name], MD;  Location: [facility];  Service: Cardiovascular;  Laterality: N/A;  . DIALYSIS/PERMA CATHETER INSERTION N/A 11/08/2019   Procedure: DIALYSIS/PERMA CATHETER INSERTION;  Surgeon: [name], MD;  Location: [facility];  Service: Cardiovascular;  Laterality: N/A;  . DIALYSIS/PERMA CATHETER REMOVAL N/A 02/15/2019   Procedure: DIALYSIS/PERMA CATHETER REMOVAL;  Surgeon: [name], MD;  Location: [facility];  Service: Cardiovascular;  Laterality: N/A;  . DIALYSIS/PERMA CATHETER REMOVAL N/A 02/21/2020   Procedure: DIALYSIS/PERMA CATHETER REMOVAL;  Surgeon: [name], MD;  Location: [facility];  Service: Cardiovascular;  Laterality: N/A;  . INSERTION OF DIALYSIS CATHETER N/A 12/14/2018   Procedure: INSERTION OF DIALYSIS CATHETER ( [place] );  Surgeon: [name], MD;  Location: [facility];  Service: Vascular;  Laterality: N/A;  . INTRAOPERATIVE TRANSESOPHAGEAL ECHOCARDIOGRAM N/A 11/29/2013   Procedure: INTRAOPERATIVE TRANSESOPHAGEAL ECHOCARDIOGRAM;  Surgeon: [name], MD;  Location: [facility];  Service: Open Heart Surgery;  Laterality: N/A;  . LIGATION OF ARTERIOVENOUS  FISTULA Left 07/13/2019   Procedure: LIGATION OF ARTERIOVENOUS  FISTULA;  Surgeon: [name], MD;  Location: [facility];   Service: Vascular;  Laterality: Left;  . PERIPHERAL VASCULAR CATHETERIZATION N/A 06/12/2015   Procedure: A/V Shuntogram/Fistulagram;  Surgeon: [name], MD;  Location: [facility];  Service: Cardiovascular;  Laterality: N/A;  . PERIPHERAL VASCULAR CATHETERIZATION Left 06/12/2015   Procedure: A/V Shunt Intervention;  Surgeon: [name], MD;  Location: [facility];  Service: Cardiovascular;  Laterality: Left;  . RENAL BIOPSY Left 14  . REVISON OF ARTERIOVENOUS FISTULA Left 12/14/2018   Procedure: REVISON OF ARTERIOVENOUS FISTULA;  Surgeon: [name], MD;  Location: [facility];  Service: Vascular;  Laterality: Left;  . UPPER EXTREMITY ANGIOGRAPHY Left 02/07/2020   Procedure: UPPER EXTREMITY ANGIOGRAPHY;  Surgeon: [name], MD;  Location: [facility];  Service: Cardiovascular;  Laterality: Left;  . UPPER EXTREMITY VENOGRAPHY Bilateral 09/18/2019   Procedure: UPPER EXTREMITY VENOGRAPHY;  Surgeon:  Schnier, Dolores Lory, MD;  Location: [facility];  Service: Cardiovascular;  Laterality: Bilateral;   Social History   Tobacco Use  . Smoking status: Current Every Day Smoker    Packs/day: 0.50    Years: 32.00    Pack years: 16.00    Types: Cigarettes  . Smokeless tobacco: Never Used  . Tobacco comment: daily  Vaping Use  . Vaping Use: Never used  Substance Use Topics  . Alcohol use: Yes    Alcohol/week: 7.0 standard drinks    Types: 7 Cans of beer per week    Comment: 3 BEERS PER WEEK  . Drug use: No   Family History  Problem Relation Age of Onset  . Heart disease Father   . Heart disease Brother   . Healthy Sister   . Stroke Neg Hx   Denies family history of peripheral artery disease, venous disease or renal disease.  Allergies  Allergen Reactions  . Lisinopril Hives   REVIEW OF SYSTEMS (Negative unless checked)  Constitutional: [] Weight loss  [] Fever  [] Chills Cardiac: [] Chest pain   [] Chest pressure   [] Palpitations   [] Shortness of breath  when laying flat   [] Shortness of breath at rest   [] Shortness of breath with exertion. Vascular:  [] Pain in legs with walking   [] Pain in legs at rest   [] Pain in legs when laying flat   [] Claudication   [] Pain in feet when walking  [] Pain in feet at rest  [] Pain in feet when laying flat   [] History of DVT   [] Phlebitis   [] Swelling in legs   [] Varicose veins   [] Non-healing ulcers Pulmonary:   [] Uses home oxygen   [] Productive cough   [] Hemoptysis   [] Wheeze  [] COPD   [] Asthma Neurologic:  [] Dizziness  [] Blackouts   [] Seizures   [] History of stroke   [] History of TIA  [] Aphasia   [] Temporary blindness   [] Dysphagia   [] Weakness or numbness in arms   [] Weakness or numbness in legs Musculoskeletal:  [] Arthritis   [] Joint swelling   [] Joint pain   [] Low back pain Hematologic:  [] Easy bruising  [] Easy bleeding   [] Hypercoagulable state   [] Anemic  [] Hepatitis Gastrointestinal:  [] Blood in stool   [] Vomiting blood  [] Gastroesophageal reflux/heartburn   [] Difficulty swallowing. Genitourinary:  [x] Chronic kidney disease   [] Difficult urination  [] Frequent urination  [] Burning with urination   [] Blood in urine Skin:  [] Rashes   [] Ulcers   [] Wounds Psychological:  [] History of anxiety   []  History of major depression.  Left upper extremity pain  Physical Examination  There were no vitals filed for this visit. There is no height or weight on file to calculate BMI. Gen: WD/WN, NAD Head: [place]/AT, No temporalis wasting. Prominent temp pulse not noted. Ear/Nose/Throat: Hearing grossly intact, nares w/o erythema or drainage, oropharynx w/o Erythema/Exudate,  Eyes: Conjunctiva clear, sclera non-icteric Neck: Trachea midline.  No JVD.  Pulmonary:  Good air movement, respirations not labored, no use of accessory muscles.  Cardiac: RRR, normal S1, S2. Vascular:  Vessel Right Left  Radial Palpable Faint                          PT Palpable Palpable  DP Palpable Palpable   Left upper extremity: Warm  distally to fingers.  No wounds noted.  Faint radial pulse.  Gastrointestinal: soft, non-tender/non-distended. No guarding/reflex.  Musculoskeletal: M/S 5/5 throughout.  Extremities without ischemic changes.  No deformity or atrophy.  Neurologic: Sensation grossly intact in extremities.  Symmetrical.  Speech is fluent. Motor exam as listed above. Psychiatric: Judgment intact, Mood & affect appropriate for pt's clinical situation. Dermatologic: No rashes or ulcers noted.  No cellulitis or open wounds. Lymph : No Cervical, Axillary, or Inguinal lymphadenopathy.  CBC Lab Results  Component Value Date   WBC 23.5 (H) 01/17/2020   HGB 10.1 (L) 01/17/2020   HCT 28.6 (L) 01/17/2020   MCV 105.1 (H) 01/17/2020   PLT 485 (H) 01/17/2020   BMET    Component Value Date/Time   NA 134 (L) 01/17/2020 0426   NA 132 (L) 10/09/2019 1433   NA 132 (L) 06/24/2014 0903   K 4.5 01/17/2020 0426   K 4.8 07/11/2014 1046   CL 96 (L) 01/17/2020 0426   CL 96 (L) 06/24/2014 0903   CO2 25 01/17/2020 0426   CO2 21 06/24/2014 0903   GLUCOSE 136 (H) 01/17/2020 0426   GLUCOSE 92 06/24/2014 0903   BUN 36 (H) 01/17/2020 0426   BUN 40 (H) 10/09/2019 1433   BUN 60 (H) 06/24/2014 0903   CREATININE 5.78 (H) 01/17/2020 0426   CREATININE 9.10 (H) 06/24/2014 0903   CALCIUM 9.2 01/17/2020 0426   CALCIUM 8.8 06/24/2014 0903   GFRNONAA 10 (L) 01/17/2020 0426   GFRNONAA 7 (L) 06/24/2014 0903   GFRNONAA 11 (L) 01/14/2014 1051   GFRAA 12 (L) 01/17/2020 0426   GFRAA 8 (L) 06/24/2014 0903   GFRAA 12 (L) 01/14/2014 1051   CrCl cannot be calculated (Patient's most recent lab result is older than the maximum 21 days allowed.).  COAG Lab Results  Component Value Date   INR 1.1 11/29/2019   INR 1.2 09/24/2019   INR 1.5 (H) 07/13/2019   Radiology No results found.  Assessment/Plan The patient is a 59 year old male with known history of end-stage renal disease currently maintained on hemodialysis through a left upper  extremity brachiocephalic AV fistula.  Patient presents for scheduled left upper extremity angiogram for possible steal syndrome.  1.  Left upper extremity steal syndrome Patient with known history of end-stage renal disease currently maintained by a left upper extremity brachiocephalic AV fistula.  During dialysis, the patient experiences pain and numbness to the left hand.  Last left upper extremity intervention in May 2021.  Patient presents today for scheduled left upper extremity angiogram  2.  Hyperlipidemia: On aspirin and statin for medical management Encouraged good control as its slows the progression of atherosclerotic disease  3.  Chronic diastolic heart failure: Presents today symptomatically Followed by his primary care physician  Discussed with Dr. Mayme Genta, PA-C  04/03/2020 9:08 AM

## 2020-04-04 DIAGNOSIS — I132 Hypertensive heart and chronic kidney disease with heart failure and with stage 5 chronic kidney disease, or end stage renal disease: Secondary | ICD-10-CM | POA: Diagnosis not present

## 2020-04-04 DIAGNOSIS — I5032 Chronic diastolic (congestive) heart failure: Secondary | ICD-10-CM | POA: Diagnosis not present

## 2020-04-04 DIAGNOSIS — D631 Anemia in chronic kidney disease: Secondary | ICD-10-CM | POA: Diagnosis not present

## 2020-04-04 DIAGNOSIS — N186 End stage renal disease: Secondary | ICD-10-CM | POA: Diagnosis not present

## 2020-04-04 DIAGNOSIS — Z992 Dependence on renal dialysis: Secondary | ICD-10-CM | POA: Diagnosis not present

## 2020-04-04 DIAGNOSIS — N2581 Secondary hyperparathyroidism of renal origin: Secondary | ICD-10-CM | POA: Diagnosis not present

## 2020-04-04 DIAGNOSIS — D509 Iron deficiency anemia, unspecified: Secondary | ICD-10-CM | POA: Diagnosis not present

## 2020-04-04 DIAGNOSIS — J441 Chronic obstructive pulmonary disease with (acute) exacerbation: Secondary | ICD-10-CM | POA: Diagnosis not present

## 2020-04-04 DIAGNOSIS — J9621 Acute and chronic respiratory failure with hypoxia: Secondary | ICD-10-CM | POA: Diagnosis not present

## 2020-04-07 ENCOUNTER — Telehealth (INDEPENDENT_AMBULATORY_CARE_PROVIDER_SITE_OTHER): Payer: Self-pay

## 2020-04-07 DIAGNOSIS — D509 Iron deficiency anemia, unspecified: Secondary | ICD-10-CM | POA: Diagnosis not present

## 2020-04-07 DIAGNOSIS — J9621 Acute and chronic respiratory failure with hypoxia: Secondary | ICD-10-CM | POA: Diagnosis not present

## 2020-04-07 DIAGNOSIS — D631 Anemia in chronic kidney disease: Secondary | ICD-10-CM | POA: Diagnosis not present

## 2020-04-07 DIAGNOSIS — Z992 Dependence on renal dialysis: Secondary | ICD-10-CM | POA: Diagnosis not present

## 2020-04-07 DIAGNOSIS — I5032 Chronic diastolic (congestive) heart failure: Secondary | ICD-10-CM | POA: Diagnosis not present

## 2020-04-07 DIAGNOSIS — I132 Hypertensive heart and chronic kidney disease with heart failure and with stage 5 chronic kidney disease, or end stage renal disease: Secondary | ICD-10-CM | POA: Diagnosis not present

## 2020-04-07 DIAGNOSIS — N186 End stage renal disease: Secondary | ICD-10-CM | POA: Diagnosis not present

## 2020-04-07 DIAGNOSIS — J441 Chronic obstructive pulmonary disease with (acute) exacerbation: Secondary | ICD-10-CM | POA: Diagnosis not present

## 2020-04-07 DIAGNOSIS — N2581 Secondary hyperparathyroidism of renal origin: Secondary | ICD-10-CM | POA: Diagnosis not present

## 2020-04-07 NOTE — Telephone Encounter (Signed)
Daryl Weeks with [facility] called stating that the patient is still having left hand pain,coldness,and numbness. The patient had a procedure on 04/03/20 and is schedule to come in 04/17/20. I spoke with Eulogio Ditch NP and advise for the patient to come in this week. Patient appointment has been schedule

## 2020-04-09 DIAGNOSIS — N186 End stage renal disease: Secondary | ICD-10-CM | POA: Diagnosis not present

## 2020-04-09 DIAGNOSIS — N2581 Secondary hyperparathyroidism of renal origin: Secondary | ICD-10-CM | POA: Diagnosis not present

## 2020-04-09 DIAGNOSIS — D509 Iron deficiency anemia, unspecified: Secondary | ICD-10-CM | POA: Diagnosis not present

## 2020-04-09 DIAGNOSIS — D631 Anemia in chronic kidney disease: Secondary | ICD-10-CM | POA: Diagnosis not present

## 2020-04-09 DIAGNOSIS — Z992 Dependence on renal dialysis: Secondary | ICD-10-CM | POA: Diagnosis not present

## 2020-04-10 ENCOUNTER — Other Ambulatory Visit: Payer: Self-pay

## 2020-04-10 ENCOUNTER — Ambulatory Visit (INDEPENDENT_AMBULATORY_CARE_PROVIDER_SITE_OTHER): Payer: Medicare Other | Admitting: Nurse Practitioner

## 2020-04-10 ENCOUNTER — Encounter (INDEPENDENT_AMBULATORY_CARE_PROVIDER_SITE_OTHER): Payer: Self-pay | Admitting: Nurse Practitioner

## 2020-04-10 VITALS — BP 179/83 | HR 71 | Resp 16 | Ht 67.0 in | Wt 157.0 lb

## 2020-04-10 DIAGNOSIS — N186 End stage renal disease: Secondary | ICD-10-CM | POA: Diagnosis not present

## 2020-04-10 DIAGNOSIS — D649 Anemia, unspecified: Secondary | ICD-10-CM | POA: Insufficient documentation

## 2020-04-10 DIAGNOSIS — Z992 Dependence on renal dialysis: Secondary | ICD-10-CM | POA: Diagnosis not present

## 2020-04-10 DIAGNOSIS — I10 Essential (primary) hypertension: Secondary | ICD-10-CM | POA: Diagnosis not present

## 2020-04-10 DIAGNOSIS — E785 Hyperlipidemia, unspecified: Secondary | ICD-10-CM

## 2020-04-10 MED ORDER — HYDROCODONE-ACETAMINOPHEN 5-325 MG PO TABS: 1.0000 | ORAL_TABLET | Freq: Four times a day (QID) | ORAL | 0 refills | Status: AC | PRN

## 2020-04-12 DIAGNOSIS — N186 End stage renal disease: Secondary | ICD-10-CM | POA: Diagnosis not present

## 2020-04-12 DIAGNOSIS — Z992 Dependence on renal dialysis: Secondary | ICD-10-CM | POA: Diagnosis not present

## 2020-04-13 DEATH — deceased

## 2020-04-14 ENCOUNTER — Encounter (INDEPENDENT_AMBULATORY_CARE_PROVIDER_SITE_OTHER): Payer: Self-pay | Admitting: Nurse Practitioner

## 2020-04-14 NOTE — Progress Notes (Signed)
Subjective:    Patient ID: Daryl Weeks, male    DOB: [birth year], 59 y.o.   MRN: [id number] Chief Complaint  Patient presents with  . Follow-up    post op angio    Patient presents today after recent left upper extremity angiogram for steal syndrome.  The patient still continues to have pain, coldness and ulceration of that hand.  The patient notes that the pain is severe and it is worse during dialysis.  The patient recent angiogram does indeed concur that the patient has steal syndrome in his left hand.  Despite vascular intervention the patient still continues to have problematic symptoms.  He denies any fever, chills, nausea, vomiting or diarrhea.  He denies any chest pain or shortness of breath.   Review of Systems  Musculoskeletal: Positive for arthralgias and myalgias.  Skin: Positive for wound.  All other systems reviewed and are negative.      Objective:   Physical Exam Vitals reviewed.  HENT:     Head: Normocephalic.  Cardiovascular:     Rate and Rhythm: Normal rate and regular rhythm.     Pulses:          Radial pulses are 0 on the left side.     Arteriovenous access: left arteriovenous access is present.    Comments: Good thrill and bruit Pulmonary:     Effort: Pulmonary effort is normal.  Skin:    Comments: Several wounds on his left hand  Neurological:     Mental Status: He is alert and oriented to person, place, and time.  Psychiatric:        Mood and Affect: Mood normal.        Behavior: Behavior normal.        Thought Content: Thought content normal.        Judgment: Judgment normal.     BP (!) 179/83 (BP Location: Right Arm)   Pulse 71   Resp 16   Ht 5\' 7"  (1.702 m)   Wt 157 lb (71.2 kg)   BMI 24.59 kg/m   Past Medical History:  Diagnosis Date  . (HFpEF) heart failure with preserved ejection fraction ([place])    a. 05/2019 Echo: EF 50-55%, diast dysfxn, RVSP 56.66mmHg, Sev dil LA. Mildly dil PA.  [name] Anemia   . Anxiety   . Arthritis   . Colon  polyps   . COPD (chronic obstructive pulmonary disease) ([place])   . Coronary artery disease    a. 10/2013 NSTEMI/Cath: Severe 3VD-->CABG x 4 @ [facility] 01/2014 (LIMA->LAD, VG->OM1->OM2, VG->RCA); b. 11/2014 MV: No isch/infarct; c. 05/2019 MV: EF 40% (50-55 by echo), small,mild, fixed apical defect - ? atten vs infarct. No ischemia->low risk.  . ESRD (end stage renal disease) ([place])    a. MWFSat HD  . ETOH abuse    a. 2 beers/night.  [name] GERD (gastroesophageal reflux disease)   . History of pneumonia   . Hyperlipidemia   . Hypertension   . IgA nephropathy   . Lower GI bleed    a. Due to colon polyps. Status post resection of 14 polyps  . Non-ST elevation MI (NSTEMI) ([place]) 2016  . PAF (paroxysmal atrial fibrillation) ([place])    a. Dx 05/2019. CHA2DS2VASc = 3-->Eliquis/Amio.  . Pneumonia 08/2019  . Rheumatoid arthritis ([place])   . Shortness of breath    WITH EXERTION  . Sleep apnea   . Tobacco abuse     Social History   Socioeconomic History  . Marital status:  Single    Spouse name: Not on file  . Number of children: Not on file  . Years of education: Not on file  . Highest education level: 8th grade  Occupational History  . Not on file  Tobacco Use  . Smoking status: Current Every Day Smoker    Packs/day: 0.50    Years: 32.00    Pack years: 16.00    Types: Cigarettes  . Smokeless tobacco: Never Used  . Tobacco comment: daily  Vaping Use  . Vaping Use: Never used  Substance and Sexual Activity  . Alcohol use: Yes    Alcohol/week: 7.0 standard drinks    Types: 7 Cans of beer per week    Comment: 3 BEERS PER WEEK  . Drug use: No  . Sexual activity: Not on file  Other Topics Concern  . Not on file  Social History Narrative   Lives locally by himself.  Does not routinely exercise.   Social Determinants of Health   Financial Resource Strain:   . Difficulty of Paying Living Expenses:   Food Insecurity:   . Worried About Charity fundraiser in the Last Year:   . Arboriculturist in  the Last Year:   Transportation Needs:   . Film/video editor (Medical):   [name] Lack of Transportation (Non-Medical):   Physical Activity:   . Days of Exercise per Week:   . Minutes of Exercise per Session:   Stress:   . Feeling of Stress :   Social Connections:   . Frequency of Communication with Friends and Family:   . Frequency of Social Gatherings with Friends and Family:   . Attends Religious Services:   . Active Member of Clubs or Organizations:   . Attends Archivist Meetings:   [name] Marital Status:   Intimate Partner Violence:   . Fear of Current or Ex-Partner:   . Emotionally Abused:   [name] Physically Abused:   . Sexually Abused:     Past Surgical History:  Procedure Laterality Date  . A/V FISTULAGRAM Left 10/28/2016   Procedure: A/V Fistulagram;  Surgeon: [name], MD;  Location: [place] [facility];  Service: Cardiovascular;  Laterality: Left;  . A/V FISTULAGRAM Left 12/07/2018   Procedure: A/V FISTULAGRAM;  Surgeon: [name], MD;  Location: [place] [facility];  Service: Cardiovascular;  Laterality: Left;  . A/V FISTULAGRAM N/A 06/04/2019   Procedure: A/V Fistulagram- LEFT;  Surgeon: [name], MD;  Location: [place] [facility];  Service: Cardiovascular;  Laterality: N/A;  . A/V SHUNT INTERVENTION N/A 10/28/2016   Procedure: A/V Shunt Intervention;  Surgeon: [name], MD;  Location: [place] [facility];  Service: Cardiovascular;  Laterality: N/A;  . A/V SHUNTOGRAM Left 02/01/2019   Procedure: A/V SHUNTOGRAM;  Surgeon: [name], MD;  Location: [place] [facility];  Service: Cardiovascular;  Laterality: Left;  . AV FISTULA PLACEMENT    . AV FISTULA PLACEMENT Left 11/30/2019   Procedure: ARTERIOVENOUS (AV) FISTULA CREATION ( BRACHIAL CEPHALIC );  Surgeon: Katha Cabal, MD;  Location: [facility];  Service: Vascular;  Laterality: Left;  . CARDIAC CATHETERIZATION     RCA 90% and calcified mid LAD 80% Stenosis  . CORONARY ARTERY BYPASS GRAFT N/A  11/29/2013   Procedure: CORONARY ARTERY BYPASS GRAFTING (CABG) x 4 using endoscopically harvested right saphenous vein and left internal mammary artery;  Surgeon: [name], MD;  Location: [place] [facility];  Service: Open Heart  Surgery;  Laterality: N/A;  . dialysis catheterr  2/15  . DIALYSIS/PERMA CATHETER INSERTION N/A 07/16/2019   Procedure: DIALYSIS/PERMA CATHETER INSERTION WITH VAC CHANGE UNDER SEDATION;  Surgeon: [name], MD;  Location: [place] [facility];  Service: Cardiovascular;  Laterality: N/A;  . DIALYSIS/PERMA CATHETER INSERTION N/A 11/08/2019   Procedure: DIALYSIS/PERMA CATHETER INSERTION;  Surgeon: [name], MD;  Location: [employer];  Service: Cardiovascular;  Laterality: N/A;  . DIALYSIS/PERMA CATHETER REMOVAL N/A 02/15/2019   Procedure: DIALYSIS/PERMA CATHETER REMOVAL;  Surgeon: [name], MD;  Location: [place] [facility];  Service: Cardiovascular;  Laterality: N/A;  . DIALYSIS/PERMA CATHETER REMOVAL N/A 02/21/2020   Procedure: DIALYSIS/PERMA CATHETER REMOVAL;  Surgeon: [name], MD;  Location: [place] [facility];  Service: Cardiovascular;  Laterality: N/A;  . INSERTION OF DIALYSIS CATHETER N/A 12/14/2018   Procedure: INSERTION OF DIALYSIS CATHETER ( [place] );  Surgeon: [name], MD;  Location: [facility];  Service: Vascular;  Laterality: N/A;  . INTRAOPERATIVE TRANSESOPHAGEAL ECHOCARDIOGRAM N/A 11/29/2013   Procedure: INTRAOPERATIVE TRANSESOPHAGEAL ECHOCARDIOGRAM;  Surgeon: [name], MD;  Location: [facility];  Service: Open Heart Surgery;  Laterality: N/A;  . LIGATION OF ARTERIOVENOUS  FISTULA Left 07/13/2019   Procedure: LIGATION OF ARTERIOVENOUS  FISTULA;  Surgeon: [name], MD;  Location: [facility];  Service: Vascular;  Laterality: Left;  . PERIPHERAL VASCULAR CATHETERIZATION N/A 06/12/2015   Procedure: A/V Shuntogram/Fistulagram;  Surgeon: [name], MD;  Location: [place] [facility];  Service: Cardiovascular;  Laterality: N/A;  . PERIPHERAL  VASCULAR CATHETERIZATION Left 06/12/2015   Procedure: A/V Shunt Intervention;  Surgeon: [name], MD;  Location: [place] [facility];  Service: Cardiovascular;  Laterality: Left;  . RENAL BIOPSY Left 14  . REVISON OF ARTERIOVENOUS FISTULA Left 12/14/2018   Procedure: REVISON OF ARTERIOVENOUS FISTULA;  Surgeon: [name], MD;  Location: [facility];  Service: Vascular;  Laterality: Left;  . UPPER EXTREMITY ANGIOGRAPHY Left 02/07/2020   Procedure: UPPER EXTREMITY ANGIOGRAPHY;  Surgeon: [name], MD;  Location: [place] [facility];  Service: Cardiovascular;  Laterality: Left;  . UPPER EXTREMITY ANGIOGRAPHY Left 04/03/2020   Procedure: UPPER EXTREMITY ANGIOGRAPHY;  Surgeon: [name], MD;  Location: [place] [facility];  Service: Cardiovascular;  Laterality: Left;  . UPPER EXTREMITY VENOGRAPHY Bilateral 09/18/2019   Procedure: UPPER EXTREMITY VENOGRAPHY;  Surgeon: Katha Cabal, MD;  Location: [place] [facility];  Service: Cardiovascular;  Laterality: Bilateral;    Family History  Problem Relation Age of Onset  . Heart disease Father   . Heart disease Brother   . Healthy Sister   . Stroke Neg Hx     Allergies  Allergen Reactions  . Lisinopril Hives       Assessment & Plan:   1. ESRD (end stage renal disease) on dialysis [facility]) Patient continues to have signs symptoms of steal syndrome despite recent angiogram.  Based on this and the severity of the patient's symptoms, as well as formation of new ulcers, the patient should undergo a banding of his left brachiocephalic AV fistula.  I discussed the procedure as well as the risk, benefits and alternatives.  Patient agrees to proceed.  Patient will follow up in office following his surgery.  2. Essential hypertension Continue antihypertensive medications as already ordered, these medications have been reviewed and there are no changes at this time.   3. Hyperlipidemia, unspecified hyperlipidemia type Continue statin as ordered  and  reviewed, no changes at this time    Current Outpatient Medications on File Prior to Visit  Medication Sig Dispense Refill  . albuterol (VENTOLIN HFA) 108 (90 Base) MCG/ACT inhaler Inhale 2 puffs into the lungs every 4 (four) hours as needed for shortness of breath or wheezing.    [name] amiodarone (PACERONE) 200 MG tablet Take 200 mg by mouth daily.    [name] amLODipine (NORVASC) 10 MG tablet Take by mouth.    [name] apixaban (ELIQUIS) 5 MG TABS tablet Take 1 tablet (5 mg total) by mouth 2 (two) times daily. 60 tablet 1  . aspirin EC 81 MG tablet Take 1 tablet (81 mg total) by mouth daily. 90 tablet 3  . atorvastatin (LIPITOR) 20 MG tablet take 1 tablet by mouth at bedtime (Patient taking differently: Take 20 mg by mouth every morning. ) 30 tablet 6  . AURYXIA 1 GM 210 MG(Fe) tablet Take 420 mg by mouth 3 (three) times daily with meals.   11  . calcium carbonate (TUMS - DOSED IN MG ELEMENTAL CALCIUM) 500 MG chewable tablet Chew 1 tablet by mouth daily as needed for indigestion or heartburn.     . carvedilol (COREG) 12.5 MG tablet Take 2 tablets (25 mg total) by mouth 2 (two) times daily with a meal. 180 tablet 0  . cinacalcet (SENSIPAR) 30 MG tablet Take 30 mg by mouth daily with supper.     [name] dextromethorphan-guaiFENesin (MUCINEX DM) 30-600 MG 12hr tablet Take 1 tablet by mouth 2 (two) times daily as needed for cough. 30 tablet 0  . docusate sodium (COLACE) 100 MG capsule Take 1 capsule (100 mg total) by mouth 2 (two) times daily as needed for mild constipation. 10 capsule 0  . Fluticasone Propionate, Inhal, (FLOVENT DISKUS) 100 MCG/BLIST AEPB Inhale 1 puff into the lungs 2 (two) times daily. (Patient taking differently: Inhale 1 puff into the lungs every morning. ) 60 each 3  . Fluticasone-Umeclidin-Vilant (TRELEGY ELLIPTA) 100-62.5-25 MCG/INH AEPB Inhale 1 puff into the lungs daily. (Patient taking differently: Inhale 1 puff into the lungs every other day. ) 60 each 5  . folic acid (FOLVITE) 1 MG tablet  Take 1 tablet (1 mg total) by mouth daily. 90 tablet 1  . furosemide (LASIX) 80 MG tablet Take 80 mg by mouth 2 (two) times daily.     [name] gabapentin (NEURONTIN) 100 MG capsule Take 100 mg by mouth 3 (three) times daily.   11  . loratadine (CLARITIN) 10 MG tablet Take 1 tablet (10 mg total) by mouth daily. (Patient taking differently: Take 10 mg by mouth every morning. ) 30 tablet 0  . multivitamin (RENA-VIT) TABS tablet Take 1 tablet by mouth at bedtime. 30 tablet 0  . ondansetron (ZOFRAN-ODT) 4 MG disintegrating tablet Take 4 mg by mouth every 8 (eight) hours as needed for nausea.     . sevelamer carbonate (RENVELA) 800 MG tablet Take 800-2,400 mg by mouth See admin instructions. Take 3 tablets (2400mg ) by mouth three times daily before meals and take 1 tablet (800mg ) by mouth once daily with a snack    . SPIRIVA HANDIHALER 18 MCG inhalation capsule Place 18 mcg into inhaler and inhale daily.     [name] zolpidem (AMBIEN) 10 MG tablet Take 10 mg by mouth at bedtime as needed for sleep.     No current facility-administered medications on file prior to visit.    There are no Patient Instructions on file for this visit. No follow-ups on file.  Kris Hartmann, NP

## 2020-04-17 ENCOUNTER — Ambulatory Visit (INDEPENDENT_AMBULATORY_CARE_PROVIDER_SITE_OTHER): Payer: Medicare Other | Admitting: Nurse Practitioner

## 2020-04-29 DIAGNOSIS — J441 Chronic obstructive pulmonary disease with (acute) exacerbation: Secondary | ICD-10-CM | POA: Diagnosis not present

## 2020-07-24 ENCOUNTER — Ambulatory Visit: Payer: Medicare Other | Admitting: Cardiovascular Disease

## 2021-10-27 IMAGING — CT CT HEAD W/O CM
3 of 4 series · 15 of 47 positions shown, 18 images · non-contrast
Comparison: 12/18/2019

CLINICAL DATA: Headache. Head trauma.

EXAM:
CT HEAD WITHOUT CONTRAST
TECHNIQUE: Contiguous axial images were obtained from the base of the skull
through the vertex without intravenous contrast.

[Series 3: head wo · axial · 0.47mm/px · z∈[+170,+310]mm · 9 of 34 slices shown, 12 images]
[im 3/34  brain]
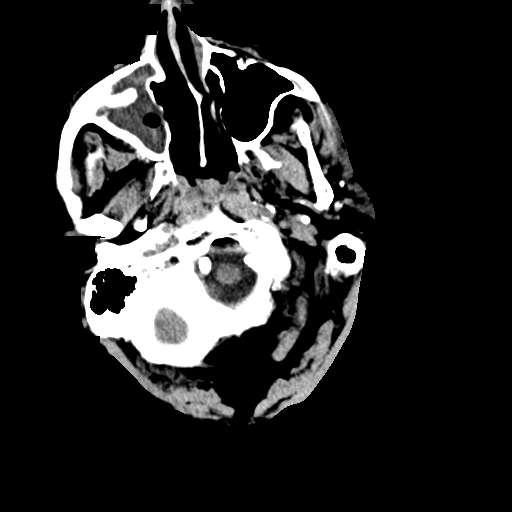
[im 3/34  bone]
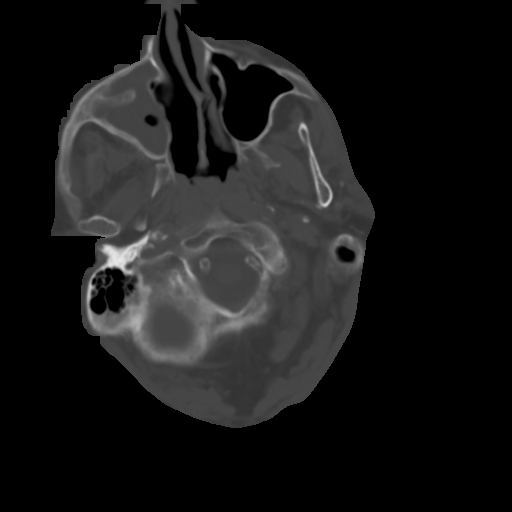
[im 8/34  brain]
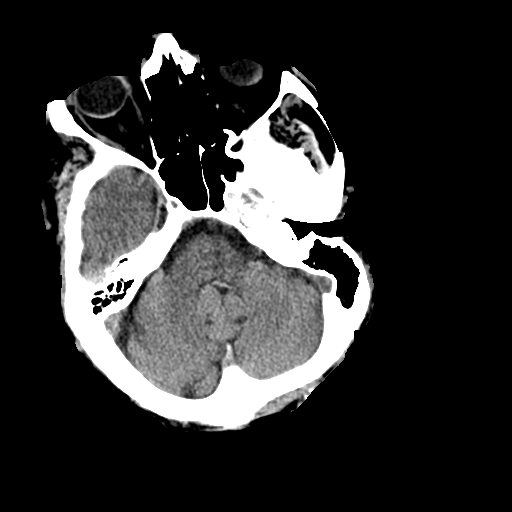
[im 10/34  brain]
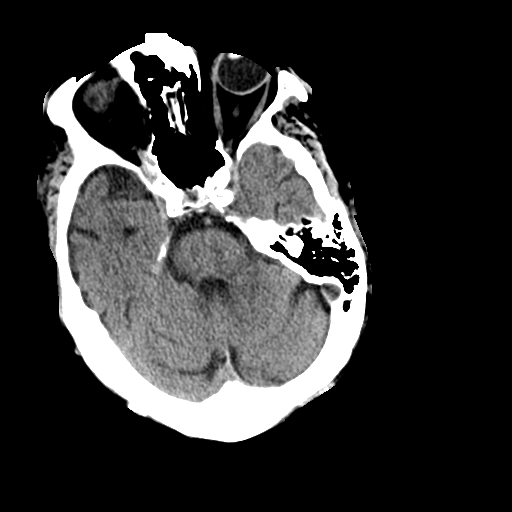
[im 15/34  brain]
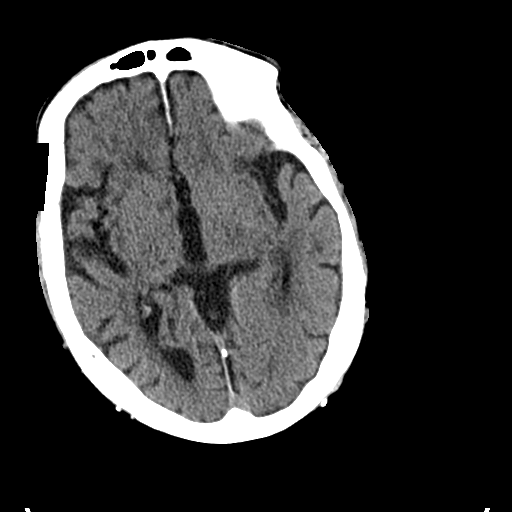
[im 17/34  brain]
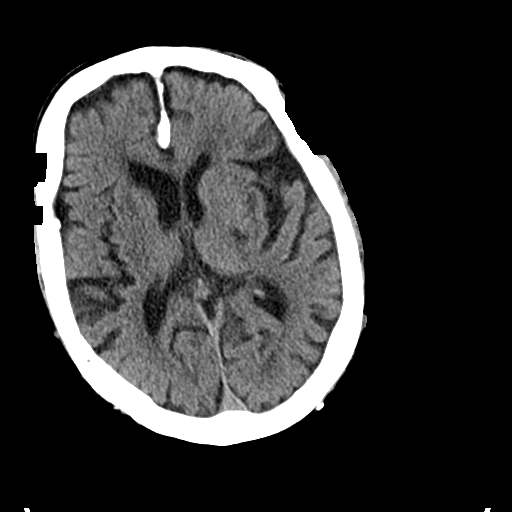
[im 17/34  bone]
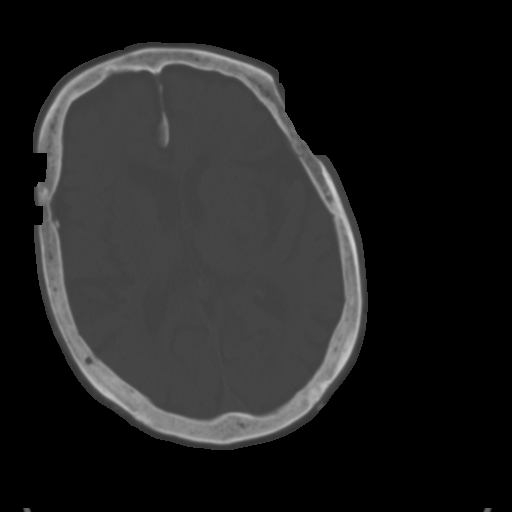
[im 19/34  brain]
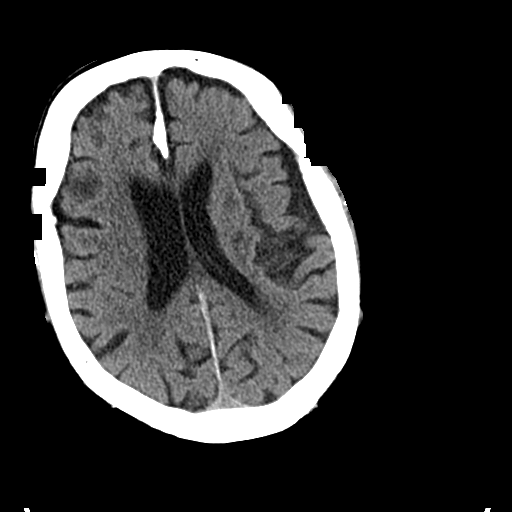
[im 24/34  brain]
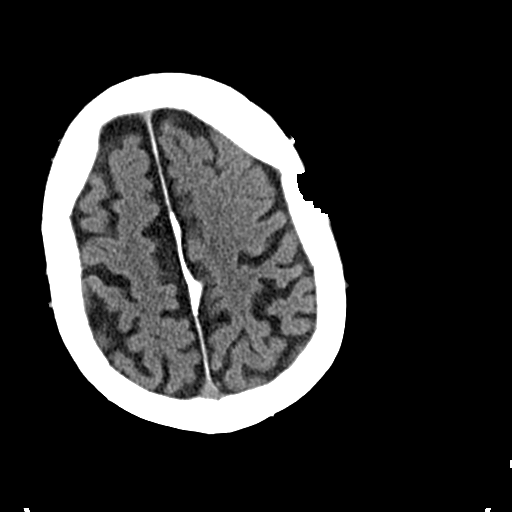
[im 26/34  brain]
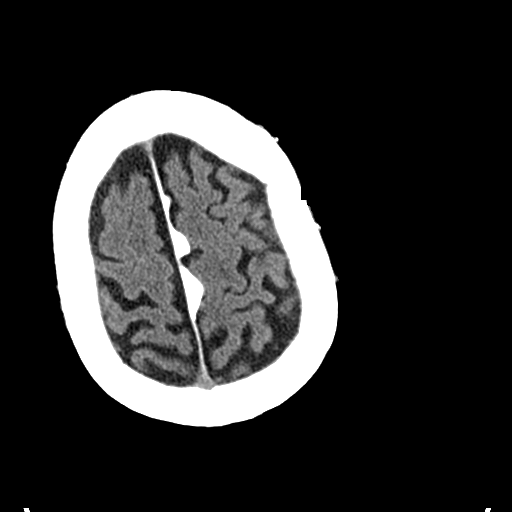
[im 31/34  brain]
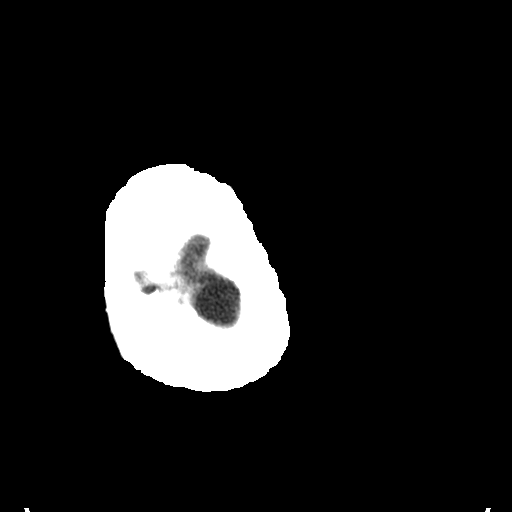
[im 31/34  bone]
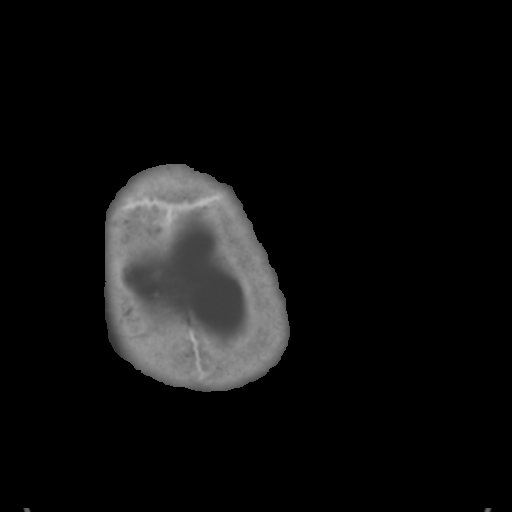

[Series 5: coronal soft tissue · coronal · 0.35mm/px · 3 of 65 slices shown]
[im 22/65  brain]
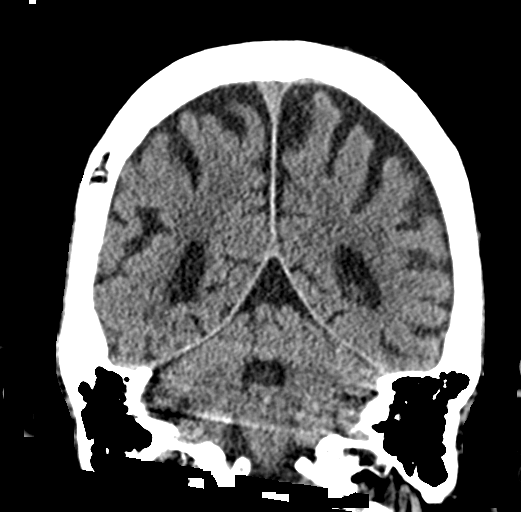
[im 29/65  brain]
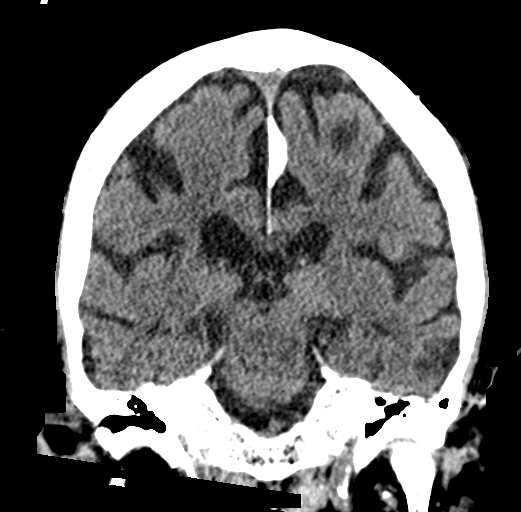
[im 36/65  brain]
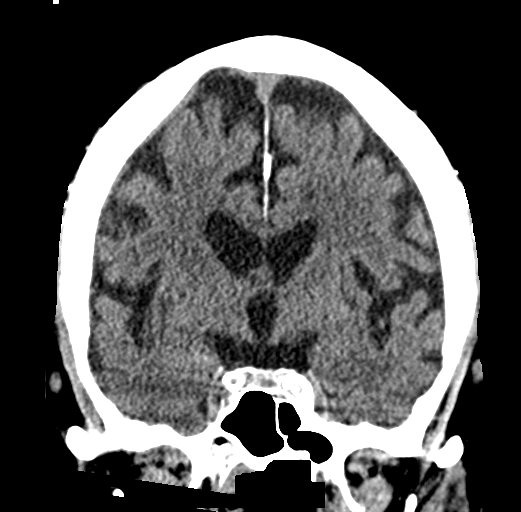

[Series 6: sagittal soft tissue · sagittal · 0.35mm/px · 3 of 53 slices shown]
[im 20/53  brain]
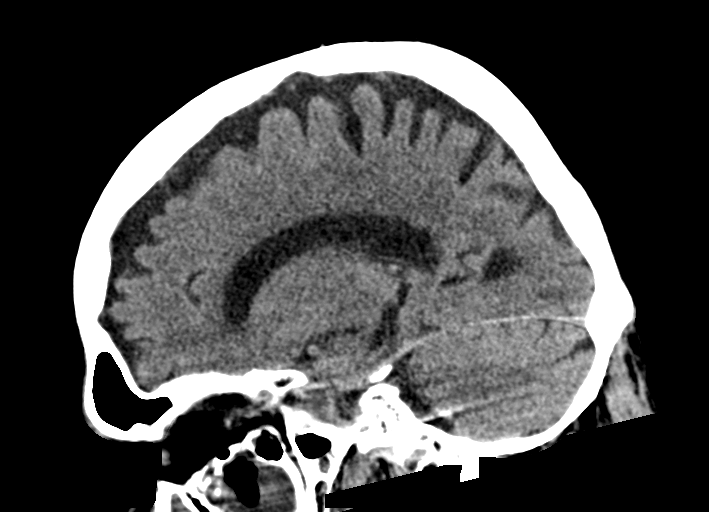
[im 27/53  brain]
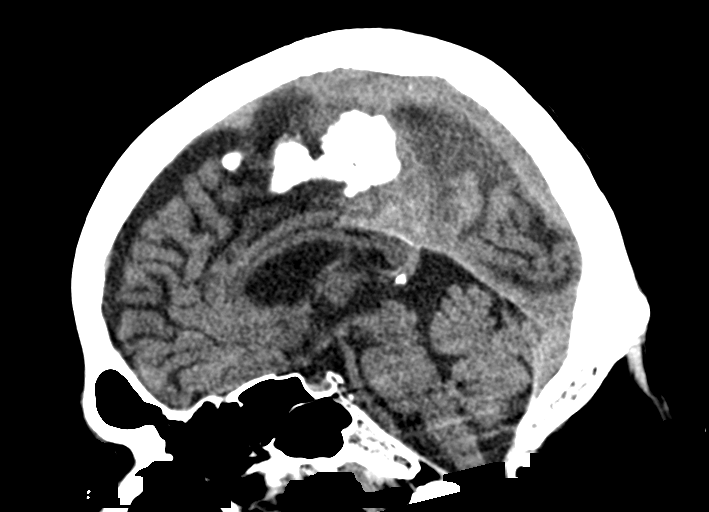
[im 33/53  brain]
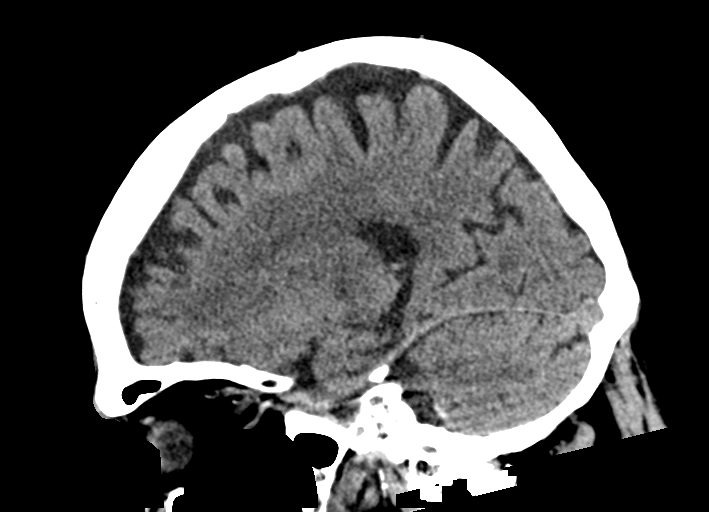

[15 of 47 positions shown; findings below may reference images not displayed]

FINDINGS: Brain: No evidence of acute infarction, hemorrhage, hydrocephalus,
extra-axial collection or mass lesion/mass effect. Atrophy and
chronic microvascular ischemic changes are again noted. There is an
old left thalamic lacunar infarct

Vascular: No hyperdense vessel or unexpected calcification.

Skull: Normal. Negative for fracture or focal lesion.

Sinuses/Orbits: There is new opacification of the right maxillary
sinus. There is mild opacification of the right frontal sinus. The
remaining paranasal sinuses and mastoid air cells are essentially
clear.

Other: None.
IMPRESSION: 1. No acute intracranial abnormality.
2. Atrophy and chronic microvascular ischemic changes.
